# Patient Record
Sex: Male | Born: 1940
Health system: Southern US, Community
[De-identification: ages and names within clinical notes are randomized; demographics above are authoritative.]

## PROBLEM LIST (undated history)

## (undated) DIAGNOSIS — I480 Paroxysmal atrial fibrillation: Secondary | ICD-10-CM

## (undated) DIAGNOSIS — M549 Dorsalgia, unspecified: Secondary | ICD-10-CM

## (undated) DIAGNOSIS — I10 Essential (primary) hypertension: Secondary | ICD-10-CM

## (undated) DIAGNOSIS — N289 Disorder of kidney and ureter, unspecified: Secondary | ICD-10-CM

## (undated) DIAGNOSIS — I2510 Atherosclerotic heart disease of native coronary artery without angina pectoris: Secondary | ICD-10-CM

## (undated) DIAGNOSIS — R200 Anesthesia of skin: Secondary | ICD-10-CM

## (undated) DIAGNOSIS — N183 Chronic kidney disease, stage 3 unspecified: Secondary | ICD-10-CM

## (undated) DIAGNOSIS — I495 Sick sinus syndrome: Secondary | ICD-10-CM

## (undated) DIAGNOSIS — M1990 Unspecified osteoarthritis, unspecified site: Secondary | ICD-10-CM

## (undated) DIAGNOSIS — K2970 Gastritis, unspecified, without bleeding: Secondary | ICD-10-CM

## (undated) DIAGNOSIS — I5189 Other ill-defined heart diseases: Secondary | ICD-10-CM

## (undated) DIAGNOSIS — I209 Angina pectoris, unspecified: Secondary | ICD-10-CM

## (undated) DIAGNOSIS — Z87442 Personal history of urinary calculi: Secondary | ICD-10-CM

## (undated) DIAGNOSIS — Z860101 Personal history of adenomatous and serrated colon polyps: Secondary | ICD-10-CM

## (undated) DIAGNOSIS — R0600 Dyspnea, unspecified: Secondary | ICD-10-CM

## (undated) DIAGNOSIS — B9681 Helicobacter pylori [H. pylori] as the cause of diseases classified elsewhere: Secondary | ICD-10-CM

## (undated) DIAGNOSIS — Z86010 Personal history of colonic polyps: Secondary | ICD-10-CM

## (undated) DIAGNOSIS — I639 Cerebral infarction, unspecified: Secondary | ICD-10-CM

## (undated) DIAGNOSIS — G8929 Other chronic pain: Secondary | ICD-10-CM

## (undated) DIAGNOSIS — C61 Malignant neoplasm of prostate: Secondary | ICD-10-CM

## (undated) DIAGNOSIS — T4145XA Adverse effect of unspecified anesthetic, initial encounter: Secondary | ICD-10-CM

## (undated) DIAGNOSIS — K219 Gastro-esophageal reflux disease without esophagitis: Secondary | ICD-10-CM

## (undated) DIAGNOSIS — K222 Esophageal obstruction: Secondary | ICD-10-CM

## (undated) DIAGNOSIS — K589 Irritable bowel syndrome without diarrhea: Secondary | ICD-10-CM

## (undated) HISTORY — DX: Other chronic pain: G89.29

## (undated) HISTORY — DX: Personal history of colonic polyps: Z86.010

## (undated) HISTORY — DX: Other ill-defined heart diseases: I51.89

## (undated) HISTORY — DX: Esophageal obstruction: K22.2

## (undated) HISTORY — DX: Gastritis, unspecified, without bleeding: K29.70

## (undated) HISTORY — DX: Gastro-esophageal reflux disease without esophagitis: K21.9

## (undated) HISTORY — DX: Chronic kidney disease, stage 3 unspecified: N18.30

## (undated) HISTORY — DX: Sick sinus syndrome: I49.5

## (undated) HISTORY — DX: Essential (primary) hypertension: I10

## (undated) HISTORY — PX: INSERT / REPLACE / REMOVE PACEMAKER: SUR710

## (undated) HISTORY — DX: Paroxysmal atrial fibrillation: I48.0

## (undated) HISTORY — PX: APPENDECTOMY: SHX54

## (undated) HISTORY — DX: Atherosclerotic heart disease of native coronary artery without angina pectoris: I25.10

## (undated) HISTORY — DX: Helicobacter pylori (H. pylori) as the cause of diseases classified elsewhere: B96.81

## (undated) HISTORY — DX: Dorsalgia, unspecified: M54.9

## (undated) HISTORY — DX: Irritable bowel syndrome, unspecified: K58.9

## (undated) HISTORY — DX: Personal history of adenomatous and serrated colon polyps: Z86.0101

## (undated) HISTORY — PX: EYE SURGERY: SHX253

---
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
## 1995-02-22 HISTORY — PX: COLONOSCOPY: SHX174

## 2000-07-30 ENCOUNTER — Encounter: Payer: Self-pay | Admitting: Orthopaedic Surgery

## 2000-07-30 ENCOUNTER — Ambulatory Visit (HOSPITAL_COMMUNITY): Admission: RE | Admit: 2000-07-30 | Discharge: 2000-07-30 | Payer: Self-pay | Admitting: Orthopaedic Surgery

## 2001-03-13 ENCOUNTER — Encounter: Payer: Self-pay | Admitting: Family Medicine

## 2001-03-13 ENCOUNTER — Ambulatory Visit (HOSPITAL_COMMUNITY): Admission: RE | Admit: 2001-03-13 | Discharge: 2001-03-13 | Payer: Self-pay | Admitting: Family Medicine

## 2001-06-15 ENCOUNTER — Encounter: Payer: Self-pay | Admitting: Family Medicine

## 2001-06-15 ENCOUNTER — Ambulatory Visit (HOSPITAL_COMMUNITY): Admission: RE | Admit: 2001-06-15 | Discharge: 2001-06-15 | Payer: Self-pay | Admitting: Family Medicine

## 2001-10-05 ENCOUNTER — Ambulatory Visit (HOSPITAL_COMMUNITY): Admission: RE | Admit: 2001-10-05 | Discharge: 2001-10-05 | Payer: Self-pay | Admitting: Family Medicine

## 2001-10-05 ENCOUNTER — Encounter: Payer: Self-pay | Admitting: Family Medicine

## 2001-10-12 ENCOUNTER — Ambulatory Visit (HOSPITAL_COMMUNITY): Admission: RE | Admit: 2001-10-12 | Discharge: 2001-10-12 | Payer: Self-pay | Admitting: Family Medicine

## 2001-10-12 ENCOUNTER — Encounter: Payer: Self-pay | Admitting: Family Medicine

## 2001-11-06 ENCOUNTER — Ambulatory Visit (HOSPITAL_COMMUNITY): Admission: RE | Admit: 2001-11-06 | Discharge: 2001-11-06 | Payer: Self-pay | Admitting: General Surgery

## 2002-01-09 ENCOUNTER — Encounter: Payer: Self-pay | Admitting: Family Medicine

## 2002-01-09 ENCOUNTER — Ambulatory Visit (HOSPITAL_COMMUNITY): Admission: RE | Admit: 2002-01-09 | Discharge: 2002-01-09 | Payer: Self-pay | Admitting: Family Medicine

## 2002-03-21 ENCOUNTER — Ambulatory Visit (HOSPITAL_COMMUNITY): Admission: RE | Admit: 2002-03-21 | Discharge: 2002-03-21 | Payer: Self-pay | Admitting: Pulmonary Disease

## 2002-05-05 ENCOUNTER — Ambulatory Visit (HOSPITAL_COMMUNITY): Admission: RE | Admit: 2002-05-05 | Discharge: 2002-05-05 | Payer: Self-pay | Admitting: Neurosurgery

## 2002-05-05 ENCOUNTER — Encounter: Payer: Self-pay | Admitting: Neurosurgery

## 2002-09-18 ENCOUNTER — Ambulatory Visit (HOSPITAL_COMMUNITY): Admission: RE | Admit: 2002-09-18 | Discharge: 2002-09-19 | Payer: Self-pay | Admitting: Neurosurgery

## 2002-09-18 ENCOUNTER — Encounter: Payer: Self-pay | Admitting: Neurosurgery

## 2002-09-18 HISTORY — PX: BACK SURGERY: SHX140

## 2002-09-22 ENCOUNTER — Emergency Department (HOSPITAL_COMMUNITY): Admission: EM | Admit: 2002-09-22 | Discharge: 2002-09-22 | Payer: Self-pay | Admitting: Emergency Medicine

## 2002-09-25 ENCOUNTER — Ambulatory Visit (HOSPITAL_COMMUNITY): Admission: RE | Admit: 2002-09-25 | Discharge: 2002-09-25 | Payer: Self-pay | Admitting: Neurosurgery

## 2002-10-16 ENCOUNTER — Encounter (HOSPITAL_COMMUNITY): Admission: RE | Admit: 2002-10-16 | Discharge: 2002-11-15 | Payer: Self-pay | Admitting: Neurosurgery

## 2002-11-05 ENCOUNTER — Ambulatory Visit (HOSPITAL_COMMUNITY): Admission: RE | Admit: 2002-11-05 | Discharge: 2002-11-05 | Payer: Self-pay | Admitting: Family Medicine

## 2002-11-05 ENCOUNTER — Encounter: Payer: Self-pay | Admitting: Family Medicine

## 2003-01-22 ENCOUNTER — Ambulatory Visit (HOSPITAL_COMMUNITY): Admission: RE | Admit: 2003-01-22 | Discharge: 2003-01-22 | Payer: Self-pay | Admitting: Family Medicine

## 2003-02-07 ENCOUNTER — Ambulatory Visit (HOSPITAL_COMMUNITY): Admission: RE | Admit: 2003-02-07 | Discharge: 2003-02-07 | Payer: Self-pay | Admitting: Family Medicine

## 2003-09-22 HISTORY — PX: COLONOSCOPY: SHX174

## 2003-09-22 HISTORY — PX: ESOPHAGOGASTRODUODENOSCOPY: SHX1529

## 2003-10-14 ENCOUNTER — Ambulatory Visit (HOSPITAL_COMMUNITY): Admission: RE | Admit: 2003-10-14 | Discharge: 2003-10-14 | Payer: Self-pay | Admitting: Internal Medicine

## 2003-10-28 ENCOUNTER — Emergency Department (HOSPITAL_COMMUNITY): Admission: EM | Admit: 2003-10-28 | Discharge: 2003-10-28 | Payer: Self-pay | Admitting: Emergency Medicine

## 2004-03-17 ENCOUNTER — Ambulatory Visit (HOSPITAL_COMMUNITY): Admission: RE | Admit: 2004-03-17 | Discharge: 2004-03-17 | Payer: Self-pay | Admitting: Family Medicine

## 2004-04-12 ENCOUNTER — Ambulatory Visit (HOSPITAL_COMMUNITY): Admission: RE | Admit: 2004-04-12 | Discharge: 2004-04-12 | Payer: Self-pay | Admitting: Family Medicine

## 2004-08-03 ENCOUNTER — Ambulatory Visit (HOSPITAL_COMMUNITY): Admission: RE | Admit: 2004-08-03 | Discharge: 2004-08-03 | Payer: Self-pay | Admitting: Family Medicine

## 2004-08-11 ENCOUNTER — Ambulatory Visit: Payer: Self-pay | Admitting: Orthopedic Surgery

## 2004-08-31 ENCOUNTER — Encounter: Admission: RE | Admit: 2004-08-31 | Discharge: 2004-08-31 | Payer: Self-pay | Admitting: Orthopedic Surgery

## 2004-09-24 ENCOUNTER — Inpatient Hospital Stay (HOSPITAL_COMMUNITY): Admission: RE | Admit: 2004-09-24 | Discharge: 2004-09-26 | Payer: Self-pay | Admitting: Orthopedic Surgery

## 2004-09-24 HISTORY — PX: BACK SURGERY: SHX140

## 2005-02-27 ENCOUNTER — Emergency Department (HOSPITAL_COMMUNITY): Admission: EM | Admit: 2005-02-27 | Discharge: 2005-02-27 | Payer: Self-pay | Admitting: Emergency Medicine

## 2005-02-28 ENCOUNTER — Inpatient Hospital Stay (HOSPITAL_COMMUNITY): Admission: EM | Admit: 2005-02-28 | Discharge: 2005-03-07 | Payer: Self-pay | Admitting: Emergency Medicine

## 2005-03-01 ENCOUNTER — Ambulatory Visit: Payer: Self-pay | Admitting: Cardiology

## 2005-03-02 ENCOUNTER — Ambulatory Visit: Payer: Self-pay | Admitting: Internal Medicine

## 2005-03-02 HISTORY — PX: ERCP: SHX60

## 2005-03-03 ENCOUNTER — Encounter (INDEPENDENT_AMBULATORY_CARE_PROVIDER_SITE_OTHER): Payer: Self-pay | Admitting: General Surgery

## 2005-03-04 HISTORY — PX: CHOLECYSTECTOMY: SHX55

## 2005-03-11 ENCOUNTER — Inpatient Hospital Stay (HOSPITAL_COMMUNITY): Admission: RE | Admit: 2005-03-11 | Discharge: 2005-03-17 | Payer: Self-pay | Admitting: General Surgery

## 2005-03-12 ENCOUNTER — Encounter: Payer: Self-pay | Admitting: General Surgery

## 2005-03-16 ENCOUNTER — Encounter: Payer: Self-pay | Admitting: General Surgery

## 2005-04-01 ENCOUNTER — Ambulatory Visit (HOSPITAL_COMMUNITY): Admission: RE | Admit: 2005-04-01 | Discharge: 2005-04-01 | Payer: Self-pay | Admitting: Family Medicine

## 2005-04-04 ENCOUNTER — Ambulatory Visit (HOSPITAL_COMMUNITY): Admission: RE | Admit: 2005-04-04 | Discharge: 2005-04-04 | Payer: Self-pay | Admitting: Family Medicine

## 2005-04-05 ENCOUNTER — Encounter (HOSPITAL_COMMUNITY): Admission: RE | Admit: 2005-04-05 | Discharge: 2005-05-05 | Payer: Self-pay | Admitting: Family Medicine

## 2005-05-12 ENCOUNTER — Ambulatory Visit (HOSPITAL_COMMUNITY): Admission: RE | Admit: 2005-05-12 | Discharge: 2005-05-12 | Payer: Self-pay | Admitting: Family Medicine

## 2005-05-16 ENCOUNTER — Ambulatory Visit (HOSPITAL_COMMUNITY): Admission: RE | Admit: 2005-05-16 | Discharge: 2005-05-16 | Payer: Self-pay | Admitting: Orthopaedic Surgery

## 2005-10-21 ENCOUNTER — Ambulatory Visit (HOSPITAL_COMMUNITY): Admission: RE | Admit: 2005-10-21 | Discharge: 2005-10-21 | Payer: Self-pay | Admitting: Family Medicine

## 2006-02-08 ENCOUNTER — Ambulatory Visit (HOSPITAL_COMMUNITY): Admission: RE | Admit: 2006-02-08 | Discharge: 2006-02-08 | Payer: Self-pay | Admitting: Internal Medicine

## 2006-02-10 ENCOUNTER — Encounter: Admission: RE | Admit: 2006-02-10 | Discharge: 2006-02-10 | Payer: Self-pay | Admitting: Orthopaedic Surgery

## 2006-03-24 HISTORY — PX: BACK SURGERY: SHX140

## 2006-03-29 ENCOUNTER — Inpatient Hospital Stay (HOSPITAL_COMMUNITY): Admission: RE | Admit: 2006-03-29 | Discharge: 2006-04-03 | Payer: Self-pay | Admitting: Orthopaedic Surgery

## 2006-08-15 ENCOUNTER — Ambulatory Visit (HOSPITAL_COMMUNITY): Admission: RE | Admit: 2006-08-15 | Discharge: 2006-08-15 | Payer: Self-pay | Admitting: Urology

## 2006-08-18 ENCOUNTER — Encounter: Admission: RE | Admit: 2006-08-18 | Discharge: 2006-08-18 | Payer: Self-pay | Admitting: Orthopaedic Surgery

## 2006-08-31 ENCOUNTER — Encounter: Admission: RE | Admit: 2006-08-31 | Discharge: 2006-08-31 | Payer: Self-pay | Admitting: Orthopaedic Surgery

## 2006-09-18 ENCOUNTER — Encounter: Admission: RE | Admit: 2006-09-18 | Discharge: 2006-09-18 | Payer: Self-pay | Admitting: Orthopaedic Surgery

## 2006-10-09 ENCOUNTER — Encounter: Admission: RE | Admit: 2006-10-09 | Discharge: 2006-10-09 | Payer: Self-pay | Admitting: Internal Medicine

## 2006-10-27 ENCOUNTER — Encounter: Admission: RE | Admit: 2006-10-27 | Discharge: 2006-10-27 | Payer: Self-pay | Admitting: Orthopaedic Surgery

## 2007-04-16 ENCOUNTER — Ambulatory Visit (HOSPITAL_COMMUNITY): Admission: RE | Admit: 2007-04-16 | Discharge: 2007-04-16 | Payer: Self-pay | Admitting: Family Medicine

## 2007-05-16 ENCOUNTER — Ambulatory Visit: Payer: Self-pay | Admitting: Internal Medicine

## 2007-08-17 ENCOUNTER — Ambulatory Visit (HOSPITAL_COMMUNITY): Admission: RE | Admit: 2007-08-17 | Discharge: 2007-08-17 | Payer: Self-pay | Admitting: Family Medicine

## 2007-11-13 ENCOUNTER — Ambulatory Visit (HOSPITAL_COMMUNITY): Admission: RE | Admit: 2007-11-13 | Discharge: 2007-11-13 | Payer: Self-pay | Admitting: Family Medicine

## 2007-11-28 ENCOUNTER — Ambulatory Visit (HOSPITAL_COMMUNITY): Admission: RE | Admit: 2007-11-28 | Discharge: 2007-11-28 | Payer: Self-pay | Admitting: Family Medicine

## 2008-01-22 HISTORY — PX: ESOPHAGOGASTRODUODENOSCOPY: SHX1529

## 2008-01-22 HISTORY — PX: OTHER SURGICAL HISTORY: SHX169

## 2008-01-24 ENCOUNTER — Ambulatory Visit: Payer: Self-pay | Admitting: Internal Medicine

## 2008-01-24 LAB — CONVERTED CEMR LAB
ALT: 35 units/L (ref 0–53)
Indirect Bilirubin: 0.7 mg/dL (ref 0.0–0.9)
Lipase: 36 units/L (ref 0–75)
Total Protein: 7.6 g/dL (ref 6.0–8.3)

## 2008-01-29 ENCOUNTER — Encounter: Payer: Self-pay | Admitting: Internal Medicine

## 2008-01-29 ENCOUNTER — Ambulatory Visit (HOSPITAL_COMMUNITY): Admission: RE | Admit: 2008-01-29 | Discharge: 2008-01-29 | Payer: Self-pay | Admitting: Internal Medicine

## 2008-01-29 ENCOUNTER — Ambulatory Visit: Payer: Self-pay | Admitting: Internal Medicine

## 2008-02-29 ENCOUNTER — Ambulatory Visit (HOSPITAL_COMMUNITY): Admission: RE | Admit: 2008-02-29 | Discharge: 2008-02-29 | Payer: Self-pay | Admitting: Internal Medicine

## 2008-03-03 ENCOUNTER — Ambulatory Visit (HOSPITAL_COMMUNITY): Admission: RE | Admit: 2008-03-03 | Discharge: 2008-03-03 | Payer: Self-pay | Admitting: Internal Medicine

## 2008-04-02 ENCOUNTER — Telehealth (INDEPENDENT_AMBULATORY_CARE_PROVIDER_SITE_OTHER): Payer: Self-pay

## 2009-04-16 ENCOUNTER — Ambulatory Visit (HOSPITAL_COMMUNITY): Admission: RE | Admit: 2009-04-16 | Discharge: 2009-04-16 | Payer: Self-pay | Admitting: Family Medicine

## 2009-06-12 ENCOUNTER — Ambulatory Visit (HOSPITAL_COMMUNITY): Admission: RE | Admit: 2009-06-12 | Discharge: 2009-06-12 | Payer: Self-pay | Admitting: Family Medicine

## 2009-08-13 ENCOUNTER — Encounter: Admission: RE | Admit: 2009-08-13 | Discharge: 2009-08-13 | Payer: Self-pay | Admitting: Orthopaedic Surgery

## 2010-03-13 ENCOUNTER — Encounter: Payer: Self-pay | Admitting: Family Medicine

## 2010-04-13 ENCOUNTER — Ambulatory Visit (HOSPITAL_COMMUNITY)
Admission: RE | Admit: 2010-04-13 | Discharge: 2010-04-13 | Disposition: A | Discharge status: Home / Self Care | Payer: Medicare Other | Source: Ambulatory Visit | Attending: Family Medicine | Admitting: Family Medicine

## 2010-04-13 ENCOUNTER — Other Ambulatory Visit (HOSPITAL_COMMUNITY): Payer: Self-pay | Admitting: Family Medicine

## 2010-04-13 DIAGNOSIS — M25579 Pain in unspecified ankle and joints of unspecified foot: Secondary | ICD-10-CM | POA: Insufficient documentation

## 2010-04-13 DIAGNOSIS — R52 Pain, unspecified: Secondary | ICD-10-CM

## 2010-04-13 DIAGNOSIS — M25476 Effusion, unspecified foot: Secondary | ICD-10-CM | POA: Insufficient documentation

## 2010-04-13 DIAGNOSIS — M25569 Pain in unspecified knee: Secondary | ICD-10-CM | POA: Insufficient documentation

## 2010-04-13 DIAGNOSIS — M25473 Effusion, unspecified ankle: Secondary | ICD-10-CM | POA: Insufficient documentation

## 2010-05-07 ENCOUNTER — Ambulatory Visit (INDEPENDENT_AMBULATORY_CARE_PROVIDER_SITE_OTHER): Payer: Medicare Other | Admitting: Urology

## 2010-05-07 ENCOUNTER — Other Ambulatory Visit: Payer: Self-pay | Admitting: Urology

## 2010-05-07 DIAGNOSIS — N390 Urinary tract infection, site not specified: Secondary | ICD-10-CM

## 2010-05-07 DIAGNOSIS — R310 Gross hematuria: Secondary | ICD-10-CM

## 2010-05-07 DIAGNOSIS — Z8546 Personal history of malignant neoplasm of prostate: Secondary | ICD-10-CM

## 2010-05-12 ENCOUNTER — Encounter (HOSPITAL_COMMUNITY): Payer: Self-pay

## 2010-05-12 ENCOUNTER — Ambulatory Visit (HOSPITAL_COMMUNITY)
Admission: RE | Admit: 2010-05-12 | Discharge: 2010-05-12 | Disposition: A | Discharge status: Home / Self Care | Payer: Medicare Other | Source: Ambulatory Visit | Attending: Urology | Admitting: Urology

## 2010-05-12 DIAGNOSIS — R310 Gross hematuria: Secondary | ICD-10-CM | POA: Insufficient documentation

## 2010-05-12 DIAGNOSIS — N200 Calculus of kidney: Secondary | ICD-10-CM | POA: Insufficient documentation

## 2010-05-12 DIAGNOSIS — C61 Malignant neoplasm of prostate: Secondary | ICD-10-CM | POA: Insufficient documentation

## 2010-05-12 HISTORY — DX: Malignant neoplasm of prostate: C61

## 2010-05-12 LAB — CREATININE, SERUM: GFR calc non Af Amer: >60 mL/min (ref >60)

## 2010-05-14 ENCOUNTER — Ambulatory Visit (INDEPENDENT_AMBULATORY_CARE_PROVIDER_SITE_OTHER): Payer: Medicare Other | Admitting: Urology

## 2010-05-14 DIAGNOSIS — N453 Epididymo-orchitis: Secondary | ICD-10-CM

## 2010-05-14 DIAGNOSIS — R310 Gross hematuria: Secondary | ICD-10-CM

## 2010-05-14 DIAGNOSIS — N390 Urinary tract infection, site not specified: Secondary | ICD-10-CM

## 2010-06-11 ENCOUNTER — Ambulatory Visit (INDEPENDENT_AMBULATORY_CARE_PROVIDER_SITE_OTHER): Payer: Medicare Other | Admitting: Urology

## 2010-06-11 DIAGNOSIS — R310 Gross hematuria: Secondary | ICD-10-CM

## 2010-06-11 DIAGNOSIS — N453 Epididymo-orchitis: Secondary | ICD-10-CM

## 2010-06-25 ENCOUNTER — Ambulatory Visit (INDEPENDENT_AMBULATORY_CARE_PROVIDER_SITE_OTHER): Payer: Medicare Other | Admitting: Urology

## 2010-06-25 DIAGNOSIS — N453 Epididymo-orchitis: Secondary | ICD-10-CM

## 2010-06-25 DIAGNOSIS — N320 Bladder-neck obstruction: Secondary | ICD-10-CM

## 2010-06-25 DIAGNOSIS — R109 Unspecified abdominal pain: Secondary | ICD-10-CM

## 2010-07-06 NOTE — Consult Note (Signed)
NAME:  KAYMEN, ADRIAN NO.:  [id number]   MEDICAL RECORD NO.:  [id number]         PATIENT TYPE:  PAMB   LOCATION:  DAY                           FACILITY:  [facility]   PHYSICIAN:  R. Roetta Sessions, M.D. DATE OF BIRTH:  Sep 22, 1940   DATE OF CONSULTATION:  DATE OF DISCHARGE:                                 CONSULTATION   PRIMARY CARE PHYSICIAN:  Angus G. McInnis, MD   CHIEF COMPLAINT:  Upper abdominal and lower abdominal pain, history of  colonic polyps, erosive reflux esophagitis.   Mr. Urias Sheek. 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  He has chronic gastroesophageal reflux disease symptoms.  He also has some constant suprapubic and bilateral lower quadrant  abdominal pain.  He has not had any melena or rectal bleeding.  He has a  history of colonic polyps, last examination was in 2005.  He is due for  a surveillance next year.  He also has history of erosive reflux  esophagitis and Schatzki ring.  Last EGD was in 2005.  We saw him on  May 16, 2007, with worsening heartburn, reflux symptoms, and  intermittent upper abdominal pain.  Gallbladder is out.  He has a  history of gangrenous cholecystitis.  Postoperative complications  included abscess requiring percutaneous drainage.  He did have common  duct stones for which he underwent ERCP and sphincterotomy by me.  He is  supposed to be on daily acid suppression therapy, but stopped taking it.  In fact, he was supposed to come back to see [place] on June 14, 2007, but  was no-show.  He is not having any odynophagia or dysphagia.  He does  not consume alcohol, in fact he is really on no medications whatsoever  at this time.   PAST MEDICAL HISTORY:  History of prostate carcinoma status post  radiation seed implants, back surgery x2, spinal stenosis,  hemorrhoidectomy, appendectomy, cholecystectomy, ERCP, history of H.  pylori  gastritis treated, irritable bowel syndrome, benign tumor removed  from his tongue.   CURRENT MEDICATIONS:  None.   ALLERGIES:  No known drug allergies   FAMILY HISTORY:  Negative for chronic GI or liver disease, although  there is a sketchy history of father possibly having gastric cancer.   SOCIAL HISTORY:  The patient is married, and he has 3 healthy children.  He is a retired Arts development officer.  No tobacco, alcohol, or illicit drug  use.   REVIEW OF SYSTEMS:  No change in weight, chest pain, dyspnea on  exertion, fever, chills, night sweats.   PHYSICAL EXAMINATION:  GENERAL:  Pleasant 70 year old gentleman resting  comfortably.  VITAL SIGNS:  Weight 262, height 6 feet, temp 98.6, BP 130/90, pulse 64.  SKIN:  Warm and dry.  There is no jaundice.  No cutaneous stigmata of  chronic liver disease.  HEENT:  No scleral icterus.  Conjunctivae are pink.  CHEST:  Lungs are clear to auscultation.  CARDIAC:  Regular rate and rhythm without murmur, gallop, or  rub.  ABDOMEN:  Nondistended, obese, positive bowel sounds, soft.  He does  have some vague right upper quadrant tenderness to palpation.  Laparoscopy scars are well healed.  No obvious mass or organomegaly.  EXTREMITIES:  No edema.  RECTAL:  Deferred at the time of colonoscopy.   IMPRESSION:  Mr. Aundray Cartlidge. Eppinger is a pleasant 70 year old gentleman with  long history of erosive reflux esophagitis.  He has had intermittent  nonspecific upper abdominal pain.  Earlier in this year in February, he  had normal LFTs, not mentioned above, and also he had a CT of the  abdomen and pelvis as he reports by Dr. Renard Matter 1 month ago at [facility] which showed no findings that would explain his symptoms.  He does  have a tendency towards constipation.  I am concerned he is not on any  acid suppression therapy in the setting of complicating gastroesophageal  reflux disease.   RECOMMENDATIONS:  I will go ahead and offer Mr. Jarchow a diagnostic EGD   at this time.  In the same setting, we will go ahead and perform a  surveillance colonoscopy.  Risks, benefits, alternatives, limitations  have been reviewed.  We will go ahead and checkup baseline LFTs,  amylase, and lipase today.  His questions were answered, and he is  agreeable.  We will set this up in the near future and make further  recommendations once endoscopic evaluation has been performed.      Jonathon Bellows, M.D.  Electronically Signed     RMR/MEDQ  D:  01/24/2008  T:  01/24/2008  Job:  [id number]   cc:   Angus G. Renard Matter, MD  Fax: [phone]

## 2010-07-06 NOTE — Op Note (Signed)
NAME:  [name], [name]                   ACCOUNT NO.:  [id number]   MEDICAL RECORD NO.:  [id number]          PATIENT TYPE:  AMB   LOCATION:  DAY                           FACILITY:  [facility]   PHYSICIAN:  R. Roetta Sessions, M.D. DATE OF BIRTH:  Mar 17, 1940   DATE OF PROCEDURE:  01/29/2008  DATE OF DISCHARGE:                               OPERATIVE REPORT   PROCEDURE PERFORMED:  Diagnostic EGD, followed by incomplete colonoscopy  with biopsy.   INDICATIONS FOR PROCEDURE:  A 70 year old gentleman with longstanding  gastroesophageal reflux disease (history of erosive reflux esophagitis),  nonspecific upper abdominal pain recently.  CT of the abdomen and pelvis  was obtained earlier this year, and it demonstrated no abnormalities.  He has a tendency towards constipation.  He has a history of colonic  polyps.  EGD and colonoscopy are now being done.  Potential risks,  benefits, and alternatives have been reviewed and questions were  answered.  Please see the documentation in the medical record.   PROCEDURE NOTE:  O2 saturation, blood pressure, pulse, and respirations  monitored throughout the entirety of both the procedures.   CONSCIOUS SEDATION:  Versed 5 mg IV, and Demerol 125 mg IV in divided  doses.   INSTRUMENT:  Pentax video chip system.  Cetacaine spray used by Pentax  video chip system.   FINDINGS:  Examination of the tubular esophagus revealed a couple of  tiny distal esophageal erosions.  There was no Barrett's esophagus or  other abnormality.  EG junction easily traversed into the stomach.  Gastric cavity was emptied, and insufflated well with air.  Thorough  examination of the gastric mucosa including retroflexed view of the  proximal stomach esophagogastric junction demonstrated only a small  hiatal hernia.  Pylorus was patent and easily traversed.  Examination of  the bulb and second portion revealed no abnormalities.  Therapeutic/diagnostic maneuvers performed: None.   The  patient tolerated the procedure and was prepared for colonoscopy.  Digital rectal exam revealed no abnormalities.  Endoscopic findings, the  prep was good.  Colon:  Colonic mucosa was surveyed from the  rectosigmoid junction well into, what I believe is, the mid colon,  encountered recurrent looping and redundancy in the colon.  It took  quite a while, taking time while changing the patient's position from  left lateral decubitus to right, to supine, and various combination of  external abdominal pressure was unable to overcome looping and  redundancy, and consequently it did not make it to the cecum   There was a diminutive mid descending colon polyp, which was cold  biopsied/removed.  He also had left-sided diverticulum.  The colonic  mucosa that was seen was otherwise normal in appearance.  The scope was  pulled back down to the rectum with examination of rectal mucosa,  including retroflexed view of the anal verge demonstrated no  abnormalities.  The patient tolerated the procedure well and with  reacted in endoscopy.   IMPRESSION:  EGD:  Tiny distal esophageal erosion consistent with mild  reflux esophagitis, otherwise unremarkable esophagus.  Small hiatal  hernia,  otherwise normal stomach, D1, D2.   COLONOSCOPY FINDINGS:  Normal rectum.  Incomplete exam.  Left-sided  diverticula and mid descending colon polyp, status post cold biopsy  removal.  Proximal colon/cecum.   RECOMMENDATIONS:  1. We will compliment today's incomplete colonoscopy with air contrast      barium enema in one month.  2. Followup by Path.  3. Diverticulosis.  4. Gastroesophageal reflux disease literature provided to Mr. Fossum.  5. He is on no acid suppression therapy.  He needs to be on some, we      will start him on some Aciphex 20 mg daily one tablet before      breakfast.  He is to get by the office for free samples.  6. Benefiber fiber supplement one tablespoon daily.  7. MiraLax 17 g early at  bedtime p.r.n. constipation.  8. Followup appointment with [place] in six weeks.      Jonathon Bellows, M.D.  Electronically Signed     RMR/MEDQ  D:  01/29/2008  T:  01/30/2008  Job:  [id number]   cc:   Angus G. Renard Matter, MD  Fax: [phone]

## 2010-07-06 NOTE — Assessment & Plan Note (Signed)
NAMEMarland Kitchen  [name], [name]                    CHART#:  [id number]   DATE:  05/16/2007                       DOB:  Dec 08, 1940   CHIEF COMPLAINT:  Indigestion.  Wants colonoscopy and EGD.   HISTORY OF PRESENT ILLNESS:  [name] is a 70 year old male.  He has  been seen in followup by Dr. Jena Gauss previously.  He has history of ERCP  and sphincter stone retrieval back in January 2007.  He also had  cholecystectomy for gangrenous cholecystitis.  He has history of erosive  esophagitis and Schatzki's ring, last EGD October 14, 2003, as well as a  small hiatal hernia.  At the same time he had a colonoscopy which showed  scattered sigmoid diverticulosis, and a polyp at the splenic flexure.  He does have history of tubular adenoma in the ascending colon, which  was removed in 1997.  He also has history of IBS.  He notes that he has  had significant indigestion and heartburn.  He is not taking any PPIs at  this time.  He denies any nausea or vomiting.  Denies any anorexia or  early satiety.  He complains of grabbing and drawing-type pain that only  lasts a few minutes.  He rates the pain 4/10 on pain scale, and points  to his upper abdomen.  His weight has remained stable.  He denies any  rectal bleeding or melena.  He has tried over-the-counter Zantac, which  does seem to help some.  He denies any dysphagia or odynophagia.   PAST MEDICAL/SURGICAL HISTORY:  He has had:  1. Hemorrhoidectomy.  2. Appendectomy.  3. Cholecystectomy for gangrenous cholecystitis.  4. ERCP with stone extraction and sphincterotomy prior to      cholecystectomy by Dr. Jena Gauss.  5. He has had 2 back surgeries.  6. He has had prostate carcinoma status post radiation, seed implants.  7. He has history of H. pylori status post treatment.  8. Chronic back pain.  9. Severe spinal stenosis.  10.IBS.  11.Benign tumor removed from his tongue.   NO KNOWN DRUG ALLERGIES.   FAMILY HISTORY:  There is no known family history of colorectal  carcinoma, however, he believes his father had gastric carcinoma, and  deceased at age 11.  Mother, age 22, has diabetes mellitus,  hypertension, coronary artery disease.  He has multiple siblings.   SOCIAL HISTORY:  [name] is married.  He has 3 healthy children.  He  is retired.  He denies any tobacco, alcohol or drug use.   REVIEW OF SYSTEMS:  See HPI, otherwise negative.   PHYSICAL EXAMINATION:  VITAL SIGNS:  Weight 261 pounds.  Height 74  inches.  Temperature 98.1.  Blood pressure 132/82.  Pulse 76.  GENERAL:  He is a well-developed, obese, Caucasian male in no acute  distress.  HEENT:  Sclera clear.  Sclerae anicteric.  Conjunctivae pink.  Oropharynx pink and moist without any lesions.  NECK:  Supple without mass or thyromegaly.  CHEST:  Heart regular rate and rhythm.  Normal S1 and S2 without  murmurs, gallops, or rubs.  LUNGS:  Clear to auscultation bilaterally.  ABDOMEN:  Protuberant with positive bowel sounds x4.  No bruits  auscultated.  Soft, non-distended, and nontender without palpable mass  or organosplenomegaly.  No guarding.  Exam was limited due  to patient's  body habitus.  EXTREMITIES:  Without clubbing or edema bilaterally.   LABORATORY STUDIES:  He had a normal TSH, a CBC which showed a white  blood cell count of 3.8, hemoglobin 16.1, hematocrit 48.3, platelets  143,000, glucose 103, and otherwise normal CMP, including LFTs.   IMPRESSION:  [name] is a 70 year old male with daily heartburn and  indigestion, as well as a grabbing-like drawing upper abdominal pain.  He has history of erosive esophagitis, and would be concerned about  complicated gastroesophageal reflux disease, however, he is not on  proton pump inhibitor.  As far as colonoscopy, given history of  adenomatous polyps, he is due for colonoscopy in August 2010.   PLAN:  1. Begin omeprazole 20 mg daily, number 30 with 2 refills.  2. Colonoscopy August 2010.  3. He is going to call if he does  not get relief from PPI, and at that      time, we would consider pursuing EGD.  Otherwise, he will follow up      in 1 month with Dr. Jena Gauss.       Lorenza Burton, N.P.  Electronically Signed     R. Roetta Sessions, M.D.  Electronically Signed    KJ/MEDQ  D:  05/17/2007  T:  05/17/2007  Job:  [id number]   cc:   Angus G. Renard Matter, MD

## 2010-07-09 NOTE — H&P (Signed)
NAMEREYAN, HELLE NO.:  [id number]   MEDICAL RECORD NO.:  [id number]           PATIENT TYPE:  INP   LOCATION:  A331                          FACILITY:  [facility]   PHYSICIAN:  Catalina Pizza, M.D.        DATE OF BIRTH:  07-Jan-1941   DATE OF ADMISSION:  02/28/2005  DATE OF DISCHARGE:  LH                                HISTORY & PHYSICAL   PRIMARY CARE PHYSICIAN:  Angus G. Renard Matter, MD   HISTORY OF PRESENT ILLNESS:  Mr. Aloi is a 70 year old gentleman with  minimal past medical history. He presents to the emergency department,  today, with worsening epigastric abdominal pain.  Apparently he was seen  approximately two weeks' ago with vague-type symptoms radiating up towards  his chest and an EKG was obtained in the office of Dr. Renard Matter and felt not  related to a cardiac source.  He, again, returned to the emergency  department on the 7th with the same type of pain and was given some pain  medicine through his IV and did improve some of his symptoms.  Again, he had  recurrence and presented, again, to the emergency department first thing  this morning.  He denies any specific nausea and vomiting.  No hematochezia.  No problems with bowel or bladder.  He had a low grade temperature  previously.  He did have a thorough workup in the emergency department  including labs and x-rays as you will see listed below.   PAST MEDICAL HISTORY:  The patient denies any previous surgeries. No other  past medical history.  History of lower back pain, status post surgery from  Dr. Darrelyn Hillock for spinal stenosis.  Past medical history also includes  prostate cancer diagnosed five years ago.  History of a 3 cm sessile tubular  adenoma in the ascending colon on coloscopy in 1997.  Status post  hemorrhoidectomy before, status post appendectomy, and benign tongue  resected on no date.   MEDICATIONS:  He is not taking any medicines routinely, other than  occasional Indocin p.r.n. per his report  1 daily if that much.   REVIEW OF SYSTEMS:  All negative except for this epigastric pain.   SOCIAL HISTORY:  Lives in [place].  Does not smoke or drink alcohol per  his report.  He has been married for 27 years, has 3 children and is  retired.   FAMILY HISTORY:  Negative for any GI illnesses, colon problems, or cancer  problems.   ALLERGIES:  No known drug allergies.   PHYSICAL EXAMINATION:  VITAL SIGNS:  On admission, following pain medicine  given, showed a blood pressure of 198/87, pulse is 81, O2 saturation 97% on  room air.  GENERAL:  This is a mildly obese, white male in no acute distress, lying in  bed, grades his pain as approximately a 5/10 at this point.  He says that it  is much improved with pain medicine and states that it is almost gone at  this time, although did grade it as a 5/10.  HEENT:  Pupils are equal, round, and react to light and accommodation.  Oropharynx is clear.  HEENT exam was normal.  LUNGS:  Clear to auscultation bilaterally.  HEART:  Regular rate and rhythm no murmurs, gallops, or rubs.  ABDOMEN:  Protuberant, but soft.  Positive bowel sounds.  No Murphy's sign.  No significant epigastric tenderness to palpation.  The patient did have a  specific point in the epigastric area which he was pointing too, but I was  unable to illicit any further pain with palpation of this.  No  hepatosplenomegaly appreciated.  EXTREMITIES:  No lower extremity edema.  No calf tenderness.  NEUROLOGIC:  Cranial nerves II-XII intact.  No deficits appreciated.   LABS:  Obtained during hospitalization had a CBC showing a white count of  9.4, hemoglobin of 15.1, platelet count of 178.  Amylase 48, lipase of 21,  CMET shows sodium 134, potassium 3.8, chloride 99, CO2 30, glucose 177, BUN  16, creatinine 1.1, total bilirubin 1.2, alkaline phosphatase 51, SGOT of  43, SGPT 38, total protein 6.5, albumin 3.6, calcium 8.6, blood cultures x2  are preliminary.  Cardiac panel  obtained shows CK of 106, CK/MB of 3,  relative index of 2.8, troponin I of 0.32.  ABG showed oxygen level at 2  liters showed pH 7.39, pCO2 of 48, and pO2 of 61.4.   IMAGES:  Obtained during ER visit showed chest x-ray showing no  cardiopulmonary disease.  Bowel gas pattern showing no obstructive bowel gas  pattern. A CT of the abdomen showed mild gallbladder distention, mild  pericholecystic stranding suggestive of cholecystitis, but suggested getting  a followup ultrasound; no other lesions noted.  Ultrasound showed  gallbladder borderline distended, but no specific evidence of cholecystitis  and hepatic steatosis, nothing else noted on pancreas or abdominal aorta.   IMPRESSION:  This is a 70 year old gentleman with epigastric pain which he  states occasionally radiates up into his chest.  Unclear cause for this.   ASSESSMENT AND PLAN:  1.  Epigastric pain.  I initially felt that this could be related to a      duodenal ulcer or gastric ulcer given taking routine Indocin, although      he has not been taking much of that. He has had previous history of      epigastric tenderness and seen by Dr. Karilyn Cota before.  We will get GI      involved for further assessment.  Do not feel that he needs a      hepatobiliary scan, at this time, and will leave that up to      gastroenterologist.  2.  Elevated cardiac markers.  The patient not having any significant EKG      changes in the emergency department, this morning, but a repeat EKG will      be obtained.  We will cycle enzyme further and get cardiology involved      for further assessment given his previous risk factors.  3.  Hypoxemia.  The patient does not note any shortness of breath, but did      have a lower O2 on pulse oximetry and confirmed on ABG as well, unsure      of his baseline, but the patient denies any smoking.  Unclear if     possibly could be something like a pulmonary embolism, could cause this.   DISPOSITION:  We will  obtain these consults and will follow along closely  with this patient. Unclear what is causing  this and Dr. Renard Matter will  followup in the morning for this.      Catalina Pizza, M.D.  Electronically Signed     ZH/MEDQ  D:  02/28/2005  T:  02/28/2005  Job:  [id number]

## 2010-07-09 NOTE — Consult Note (Signed)
[name], [name] NO.:  [id number]   MEDICAL RECORD NO.:  [id number]          PATIENT TYPE:  INP   LOCATION:  A331                          FACILITY:  [facility]   PHYSICIAN:  Rossmoyne Bing, M.D. [facility] OF BIRTH:  12-06-1940   DATE OF CONSULTATION:  03/01/2005  DATE OF DISCHARGE:                                   CONSULTATION   CARDIOLOGY CONSULTATION:   REFERRING PHYSICIAN:  Dr. Butch Penny   HISTORY OF PRESENT ILLNESS:  A 70 year old gentleman referred for  consultation concerning elevated troponin and hypoxemia in the setting of  acute cholecystitis. [name] has no significant prior cardiac history. A  treadmill stress test performed 3 years ago was reportedly negative. He has  not previously been assessed by a cardiologist nor had any other significant  cardiac testing. He recently has noted some exertional dyspnea. He has  experienced no chest discomfort. There has been no orthopnea nor PND. He  awakened with severe epigastric pain on the early morning of admission  prompting presentation to the emergency department. LFTs have been abnormal.  Troponin has been marginally elevated with normal CPK-MB.   PAST MEDICAL HISTORY:  Past medical history is notable for a history of  esophagitis, Schatzki's ring and a small hiatal hernia. He has chronic back  pain and irritable bowel syndrome. Carcinoma of prostate was treated with  brachytherapy 5 years ago. A 3 cm adenoma was resected from the right colon  in 1997. Surgical procedures have included hemorrhoidectomy, appendectomy  and intervention for spinal stenosis.   MEDICATIONS:  His only medication prior to admission was indomethacin p.r.n.   ALLERGIES:  He has no known allergies.   SOCIAL HISTORY:  Married and lives in [place]; three adult children;  retired from work as a Company secretary. No history of tobacco use nor excessive use  of alcohol. 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 Lifestyle is relatively sedentary.   FAMILY HISTORY:  Mother had CABG surgery in her 24s. Father had diabetes and  heart disease and died with cancer. Six siblings, none with coronary  disease.   REVIEW OF SYSTEMS:  Notable for occasional wheezing, mild weakness in legs  and chronic back pain. All other systems reviewed and are negative.   EXAMINATION:  GENERAL:  On exam, pleasant gentleman in no acute distress.  VITAL SIGNS:  The temperature is 101, heart rate 100 and regular,  respirations 25, blood pressure 125/75.  HEENT:  Anicteric sclerae; normal lids and conjunctiva; pupils equal, round,  react to light; normal oral mucosa.  NECK:  No jugular venous distension; faint right carotid bruit.  ENDOCRINE:  No thyromegaly.  HEMATOPOIETIC:  No adenopathy.  LUNGS:  Clear.  CARDIAC:  Normal first and second heart sounds; fourth heart sound present.  ABDOMEN:  Soft and nontender; normal bowel sounds; no organomegaly; no  masses.  EXTREMITIES:  Distal pulses intact; 1/2+ ankle edema.  MUSCULOSKELETAL:  No joint deformities.  NEUROMUSCULAR:  Symmetric strength and tone; normal cranial nerves.  PSYCHIATRIC:  Alert and  oriented; normal affect.   EKG:  Normal sinus rhythm; first-degree AV block; borderline left atrial  abnormality; left axis; nondiagnostic lateral Q-waves.   IMPRESSION:  [name] presents with acute cholecystitis. Relief of biliary  obstruction by ERCP is relatively urgently required. At the present time, he  appears stable although he continues to have fevers on antibiotics. His case  was discussed with Dr. Jena Gauss, who agreed that a brief delay in optimal  therapy is acceptable. I am particularly concerned about hypoxemia and  recent exertional dyspnea with mild elevations of troponin. Pulmonary  embolism is certainly a consideration in this setting. A D-dimer level will  be obtained and will likely be elevated in which case a CT scan of  chest  will be obtained to rule out pulmonary embolism and to further investigate  hypoxemia.   While his marginal elevation in troponin is certainly of concern, there is  no clear indication for an acute coronary syndrome. While stress testing  would be desirable, his risk of an acute coronary event in the setting of  the proposed relatively low risk procedure outweighs the benefits of  delaying same for functional perfusion imaging.   We greatly appreciate the request to provide consultative services for this  gentleman and will be happy to follow him with you.      Munford Bing, M.D. [facility]  Electronically Signed     RR/MEDQ  D:  03/01/2005  T:  03/01/2005  Job:  [id number]   cc:   Angus G. Renard Matter, MD  Fax: [phone]

## 2010-07-09 NOTE — Discharge Summary (Signed)
NAMECAMAR, [name] NO.:  [id number]   MEDICAL RECORD NO.:  [id number]          PATIENT TYPE:  INP   LOCATION:  5013                         FACILITY:  [facility]   PHYSICIAN:  Sharolyn Douglas, M.D.        DATE OF BIRTH:  1940-12-03   DATE OF ADMISSION:  03/29/2006  DATE OF DISCHARGE:  04/03/2006                               DISCHARGE SUMMARY   ADMITTING DIAGNOSES:  1. L2 to L5 spondyl stenosis and spondylosis.  2. History of prostate cancer.   DISCHARGE DIAGNOSES:  1. Status post L2 to L5 revision laminectomy and posterior spinal      fusion with pedicle screws.  2. Postoperative blood loss anemia.  3. Hyponatremia.  4. Hyperglycemia.  5. History of prostate cancer.   PROCEDURE:  On March 29, 2006 the patient was taken to the operating  room for an L2 to L5 revision laminectomy and posterior spinal fusion  with pedicle screws.  He also had repair of incidental sternotomy  intraoperatively.   SURGEON:  Max Noel Gerold.   ASSISTANT:  Colleen Mahar, P.A. C.   ANESTHESIA:  General.   CONSULTS:  None.   LABORATORY DATA:  Preoperatively, CBC with diff was within normal  limits.  H and H monitored x4 days postoperatively reached a low of 9.8  and 28.2.  PT INR and PTT preop normal.  Complete metabolic panel from  preop was normal with exception of glucose of 101.  Postoperatively,  BMET was monitored times 2 days.  Postop day 1, glucose was 138.  Postop  day 2, glucose was 138 and sodium was 132, otherwise normal.  UA from  preop was negative.  Blood typing preop was A+.  Antibody screen was  negative.  Urine culture showed no growth.  EKG from March 27, 2006  shows left sinus rhythm with first degree AV block, left axis deviation.  When compared to EKG from 2006, no significant change, read by Charlton Haws, M.D.  X-ray from March 29, 2006 showed interpedicular screws  on L2 to L5.  No complicating features.  X-ray from April 03, 2006  showed L2 to L5  decompression and effusion.  No postoperative  complication identified.   BRIEF HISTORY:  The patient is a 70 year old male who has had a long  history of problems with his back and his bilateral lower extremities.  He was unable to stand upright.  He had significant neurogenic  claudication, and 2 previous lumbar laminectomies.  Imaging studies  showed persistent spinal stenosis and thoracolumbar kyphosis.  Because  of this, as well as failure to improve conservatively, it was felt his  best course of management would be a revision laminectomy and fusion.  Risks and benefits of the surgery were discussed with the patient by Dr.  Noel Gerold, as well as myself.  He indicated understanding and opted to  proceed.   HOSPITAL COURSE:  On March 25, 2006, the patient was taken to the  operating room for the above listed procedure.  He tolerated the  procedure well without any intraoperative complications.  He was  transferred to the recovery room in stable condition.   Postoperatively, routine orthopedic spine protocol was followed, and he  progressed along relatively well.   Postoperatively, physical therapy and occupational therapy worked with  him on a daily basis on brace use and back precautions and a progressive  ambulation program.  He progressed along well with that.  The patient's  mild hyponatremia was treated with decreasing his IV fluids and he did  not require any further treatment for that.   On April 03, 2006, the patient was doing very well from a medical  standpoint.  He was orthopedically stable and ready for discharge home.   DISCHARGE PLAN:  The patient is a 70 year old male status post revision  lumbar laminectomy and posterior spinal fusion doing well.  Activity and  daily ambulation program.  Back precautions at all times.  No lifting  heavier than 5 pounds.  He may  shower.  Follow up 2 weeks  postoperatively with Dr. Noel Gerold.   MEDICATIONS ON DISCHARGE:  Vicodin  for pain.  Robaxin for muscle spasm.  Multivitamin daily.  Calcium 1200 daily.  Colace 100 mg twice daily.  Laxative as needed.  Avoid NSAIDS times 3 months.   DIET:  Regular home diet as tolerated.   CONDITION ON DISCHARGE:  Stable and improved.   DISPOSITION:  The patient is being discharged to his home with his  family's assistance, as well as home physical therapy and occupational  therapy.      Verlin Fester, P.A.      Sharolyn Douglas, M.D.  Electronically Signed    CM/MEDQ  D:  06/15/2006  T:  06/15/2006  Job:  [id number]

## 2010-07-09 NOTE — Procedures (Signed)
   NAME:  [name], [name]                             ACCOUNT NO.:  [id number]   MEDICAL RECORD NO.:  [id number]                   PATIENT TYPE:  OUT   LOCATION:  DFTL                                 FACILITY:  [facility]   PHYSICIAN:  Edward L. Juanetta Gosling, M.D.             DATE OF BIRTH:  1940/11/06   DATE OF PROCEDURE:  DATE OF DISCHARGE:                                    STRESS TEST   PROCEDURE:  Stress test.   BRIEF HISTORY:  This patient is undergoing graded exercise testing because  he has hyperlipidemia and is to rule out ischemic cardiac disease.   There are no contraindications to graded exercise testing.   DESCRIPTION OF PROCEDURE:  This patient exercised for 6 minutes and 3-  seconds on the Bruce Protocol reaching, and sustaining, 7.0 METS.  His  maximum recorded heart rate was 142 which is 90% of his age-predicted  maximum heart rate.  He had no chest pain during exercise.  His blood pressure response to  exercise was somewhat exaggerated. He had no electrocardiographic suggestive  of inducible ischemia and he did have PVCs at the peak of exercise.   IMPRESSION:  1. No evidence of inducible ischemia.  2. No chest pain during exercise.  3. Slightly exaggerated blood pressure response to exercise.  4. Premature ventricular contractions with exercise.  5. Fair exercise tolerance.                                               Edward L. Juanetta Gosling, M.D.    ELH/MEDQ  D:  03/21/2002  T:  03/21/2002  Job:  [id number]   cc:   Angus G. Renard Matter, M.D.  [address]  [place]  [place] [address]  Fax: [phone]

## 2010-07-09 NOTE — Consult Note (Signed)
NAME:  MARGUES, FILIPPINI                   ACCOUNT NO.:  [id number]   MEDICAL RECORD NO.:  [id number]          PATIENT TYPE:  INP   LOCATION:  A331                          FACILITY:  [facility]   PHYSICIAN:  R. Roetta Sessions, M.D. DATE OF BIRTH:  [birth year]   DATE OF CONSULTATION:  DATE OF DISCHARGE:                                   CONSULTATION   REASON FOR CONSULTATION:  Severe epigastric pain.   HISTORY OF PRESENT ILLNESS:  Mr. Banh is a 70 year old Caucasian male who  has had a two day history of severe epigastric pain.  He was awakened from  his slumber, Saturday morning around 2 a.m.  The pain was so severe that he  presented to the emergency department.  He underwent evaluation there, had  an acute abdomen and chest x-ray which was normal.  He had EKG which, per  the patient, was normal, and was given some IV pain medication and sent  home. He did feel better.  However, that evening he had a cube steak for  dinner, and again was awakened in the early morning hours with severe  abdominal pain in his epigastrium.  He rates the pain 10/10 at worse.  It is  currently about 6/10.  The pain is constant and stabbing.  There is no  relief except when he is given IV pain medications.  He denies any nausea,  vomiting, diarrhea or change in his bowel habits.  His last bowel movement  was yesterday.  It was soft and brown.  He denies any rectal bleeding or  melena.  He generally has daily bowel movements.  He does have occasional  indigestion.  He notes the pain does radiate up to his anterior chest area.  He states he has never had this type of abdominal pain before.  There is no  associated shortness of breath, diaphoresis.  He has had low-grade  temperature around 100.  He is not taking any NSAIDS or aspirin at home.  Takes Rolaids about once a month.  He denies any dysphagia or odynophagia.  He had been on Indomethacin previously, but this was discontinued a week ago  for chronic back pain.   He had normal troponins yesterday.  Today he was found to have an elevated  troponin at 0.32.  Cardiology consult is pending.   He had an abdominal ultrasound which showed a mildly distended gallbladder,  normal CBD and hepatic steatosis.  He has CT scan of abdomen and pelvis  which showed mild gallbladder distention and mild cholecystic inflammation.   PAST MEDICAL HISTORY:  1.  He had an EGD on October 14, 2003, which showed erosive esophagitis and      __________Schatzki's Ring, small hiatal hernia.  At the same time, he      had a colonoscopy which showed scattered sigmoid diverticulosis, and he      had a polyp at the splenic flexure.  2.  Previously, in 1997, he had a colonoscopy with a 3 cm tubular adenoma in      the ascending colon by Dr. Danny Lawless.  He was CLO test positive and status      post treatment in 1997.  3.  He has history of IBS.  4.  Chronic back pain for which he is followed by Dr. Tinnie Gens.  5.  He had surgery in August 2006 for severe spinal stenosis with disease      from L3, L4 and L5.  6.  He has had previous hemilaminectomy and microdiskectomy as well.  7.  He had prostate carcinoma and was treated with __________implants.   MEDICATIONS PRIOR TO ADMISSION:  Indocin 75 mg daily which was discontinued  a week ago.   ALLERGIES:  No known drug allergies.   FAMILY HISTORY:  Mother, age 35, is alive with history of coronary artery  disease and diabetes mellitus.  Father deceased at age 18 secondary to  coronary artery disease.  He also had diabetes mellitus.  He has four live  siblings, one deceased, otherwise noncontributory.   SOCIAL HISTORY:  Mr. Kolakowski has been married for 29 years.  He has three  grown healthy children.  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 He  denies any tobacco, alcohol or drug use.   REVIEW OF SYSTEMS:  CONSTITUTIONAL:  Weight is stable.  Denies any fatigue.  Denies any anorexia or early satiety.  CARDIOVASCULAR:  He reportedly had  a  normal cardiac workup within the last year.  PULMONOLOGY:  Denies any cough,  shortness of breath, dyspnea or hemoptysis.  Although, his O2 saturations  have been somewhat low.   PHYSICAL EXAMINATION:  VITAL SIGNS:  Temperature 101.1, pulse 014,  respirations 20, blood pressure 111/71, O2 saturation 91% on 2 liters per  nasal cannula.  GENERAL APPEARANCE:  Mr. Pham is a 70 year old Caucasian male who is alert  and oriented, pleasant, cooperative in no acute distress.  HEENT:  Sclerae are clear, nonicteric.  Conjunctivae are pink.  Oropharynx  pink and moist without any lesions.  NECK:  Supple without any mass or thyromegaly.  CHEST:  Heart regular rate and rhythm with normal S1, S2, without any  murmurs, clicks, rubs or gallops.  LUNGS:  Clear to auscultation bilaterally.  ABDOMEN:  Positive bowel sounds x4.  No bruits auscultated.  Soft,  nondistended.  He does have mild tenderness on depalpation to the  epigastrium.  Negative Murphy's sign.  No rebound tenderness or guarding.  Liver is palpable 1-2 fingerbreadth below the right costal margin.  Unable  to palpate splenomegaly.  No rebound tenderness or guarding.  EXTREMITIES:  Without edema or clubbing bilaterally.   LABORATORY DATA:  WBC 9.4, hemoglobin 15.1, hematocrit 43.2, platelets  178,000.  Calcium 8.6.  Sodium 134, potassium 3.8, chloride 99, CO2 30, BUN  16, creatinine 1.1, glucose 177.  Total bilirubin 1.2, alk-phosphatase 51,  AST 43, ALT 38, total protein 6.5, albumin 3.6, amylase 48, lipase 21.   IMPRESSION:  Mr. Hackbart is a 70 year old Caucasian male with a two day  history of sudden onset of epigastric pain that radiates to the anterior  chest bilaterally.  The patient notes the pain awakened him from slumber at  2 a.m. Saturday morning.  He reported to the ER and was sent home with good  pain relief.  The patient returned once his medications were off.  He returned to the emergency room.  He had also had a steak  to eat that night.  He had an abdominal ultrasound today which showed gallbladder was mildly  distended, hepatic steatosis and a normal common  bile duct.  He had a CT of  the abdomen and pelvis which confirmed a moderately distended gallbladder  and mild cholecystic inflammation, but these changes were not marked.  The  patient had elevated troponin at 0.32.  He has some low O2 saturation in the  88 to 91 range and a family history of coronary artery disease.  He has a  cardiology consult pending.   Mr.  Alicea pain is atypical of peptic ulcer disease and could be related  to erosive esophagitis, but I feel his symptoms could be biliary in  etiology.  His pain could be related to cholecystitis as well as given CT  findings and elevation in his white blood cell and fever.  Mr. Vreeland is  going to need cardiac workup initially given findings above.   PLAN:  1.  Will await Cardiology input.  2.  Recheck CBC in a.m.  Check liver function tests now.  Will consider HIDA      scan initially.  If this is negative, would proceed with EGD.  3.  Agree with b.i.d. Protonix.  4.  Further recommendations to follow.   We would like to thank Dr. Margo Aye for allowing [place] to participate in the care  of Mr. Vines.      Nicholas Lose, N.P.      Jonathon Bellows, M.D.  Electronically Signed    KC/MEDQ  D:  02/28/2005  T:  03/01/2005  Job:  [id number]   cc:   Angus G. Renard Matter, MD  Fax: [phone]

## 2010-07-09 NOTE — Group Therapy Note (Signed)
[name], [name] NO.:  [id number]   MEDICAL RECORD NO.:  [id number]          PATIENT TYPE:  INP   LOCATION:  A213                          FACILITY:  [facility]   PHYSICIAN:  Catalina Pizza, M.D.        DATE OF BIRTH:  Mar 13, 1940   DATE OF PROCEDURE:  03/05/2005  DATE OF DISCHARGE:                                   PROGRESS NOTE   SUBJECTIVE:  [name] is postop day #2 from laparoscopic cholecystectomy  following a gangrenous gallbladder.  He has remained stable following  surgery.  He has been lethargic due to pain medicine, but he has been  responding appropriately when the patient is alert.   OBJECTIVE:  VITAL SIGNS:  Stable.  Patient has remained afebrile with T-max  of 99.5.  LUNGS:  Clear to auscultation bilaterally.  HEART:  Regular rate and rhythm.  ABDOMEN:  Soft with some abdominal incisions.  Only tenderness around the  incision site, but no other problems noted.  Drain is still in place.   LABORATORY DATA:  CBC:  White count 7.8, hemoglobin 11.0.  B-met:  Sodium  136, potassium 3.8, chloride 101, CO2 27, glucose 127, BUN 16, creatinine  0.9, calcium 7.3.  hepatic function showed total bilirubin of 1.3 down from  yesterday which was 2.0, direct bilirubin at 0.7, SGOT 96, SGPT 81, total  protein 5.4, albumin 1.9.   ASSESSMENT:  This is a 70 year old white male who is status post  cholecystectomy for gangrenous gallbladder.   PLAN:  The patient is followed closely by Dr. Lovell Sheehan, who is ordering most  of the orders.  Plan is to get him out of the ICU today.  Continue to try to  ambulate the patient and get the patient back to normal functioning before  leaving the hospital.  Estimated date of discharge will be beginning of next  week, rechecking routine labs daily and cutting back on his pain medication  so the patient is not as lethargic.  When the patient is more with it, then  we will discontinue the Foley as well.      Catalina Pizza, M.D.  Electronically Signed     ZH/MEDQ  D:  03/05/2005  T:  03/07/2005  Job:  [id number]

## 2010-07-09 NOTE — Group Therapy Note (Signed)
NAMEBRIANT, ANGELILLO NO.:  [id number]   MEDICAL RECORD NO.:  [id number]          PATIENT TYPE:  INP   LOCATION:  A213                          FACILITY:  [facility]   PHYSICIAN:  Angus G. Renard Matter, MD   DATE OF BIRTH:  [birth year]   DATE OF PROCEDURE:  03/06/2005  DATE OF DISCHARGE:                                   PROGRESS NOTE   SUBJECTIVE:  This patient had surgery, cholelithiasis, cholecystitis for  gangrene of gallbladder.  Procedure was laparoscopic cholecystectomy.  His  condition has remained stabled since surgery.   OBJECTIVE:  VITAL SIGNS:  Blood pressure 127/73, respirations 20, pulse 73,  temperature 98.  Chemistry shows a sodium of 134, potassium 3.4, chloride  101, CO2 29.  Liver enzymes:  SGOT 84, SGPT 75, bilirubin 0.5.  HEART:  Regular rhythm.  LUNGS:  Diminished breath sounds.  ABDOMEN:  The patient is tender around surgical sites on abdomen.   ASSESSMENT:  The patient is two days postop following laparoscopic  cholecystectomy for gangrenous gallbladder.   PLAN:  Continue current regimen.      Angus G. Renard Matter, MD  Electronically Signed     AGM/MEDQ  D:  03/06/2005  T:  03/07/2005  Job:  [id number]

## 2010-07-09 NOTE — H&P (Signed)
[name], [name] NO.:  [id number]   MEDICAL RECORD NO.:  [id number]          PATIENT TYPE:  INP   LOCATION:  NA                           FACILITY:  [facility]   PHYSICIAN:  Georges Lynch. Gioffre, M.D.DATE OF BIRTH:  1941/01/14   DATE OF ADMISSION:  DATE OF DISCHARGE:                                HISTORY & PHYSICAL   HISTORY OF PRESENT ILLNESS:  The patient has had back pain radiating into  both lower extremities.  He has had severe pain for the past four to six  weeks.  The pain tends to be much worse at night.  The patient was seen in  the office and evaluated with CT myelogram and was found to have severe  spinal stenosis at L3-L4 and L4-L5.  The patient elects to proceed with  decompression for pain control.   DRUG ALLERGIES:  None.   PRIMARY CARE PHYSICIAN:  Angus G. Renard Matter, MD, [place], [place].   CURRENT MEDICATIONS:  He is on Indocin SR daily.   PAST SURGICAL HISTORY:  Patient had back surgery in 2000 for herniated disc  at L4-L5 on the right by Dr. Aliene Beams.   PAST MEDICAL HISTORY:  Significant for previous herniated disc, spinal  stenosis, prostate cancer.  He had seed implants in 2000 by Dr. Margo Aye at  [facility].   FAMILY HISTORY:  Mother had coronary artery disease and arthritis.   REVIEW OF SYSTEMS:  GENERAL:  Denies weight change, fever, chills, fatigue.  HEENT:  Denies headaches, visual changes, tinnitus, hearing loss, sore  throat. CARDIOVASCULAR:  Denies chest pain, palpitations, shortness of  breath, orthopnea. PULMONARY:  Denies dyspnea, wheezing, cough, sputum  production, hemoptysis.  GASTROINTESTINAL:  Denies nausea, vomiting,  hematemesis or abdominal pain.  GENITOURINARY:  Denies dysuria, frequency,  urgency, hematuria.  ENDOCRINE:  Denies polyuria, polydipsia, appetite  change, heat or cold intolerance.  MUSCULOSKELETAL:  Patient has back pain  radiating into both legs.  NEUROLOGICAL:  Denies dizziness, vertigo,  syncope, seizures.  SKIN:  Denies itching, rashes, masses or moles.   PHYSICAL EXAMINATION:  VITAL SIGNS:  Temperature 98.0, pulse 62,  respirations 18, blood pressure 140/90, right arm sitting.  GENERAL APPEARANCE:  70 year old male in no acute distress.  HEENT:  Pupils equal, round, reactive to light. Extraocular movements  intact.  Pharynx clear.  NECK:  Supple.  CHEST:  Clear to auscultation bilaterally with no wheezing, rales or rhonchi  noted.  HEART:  Regular rate and rhythm without murmurs.  ABDOMEN:  Positive bowel sounds, soft, nontender.  MUSCULOSKELETAL:  Examination of the back reveals very painful range of  motion.  He is unable to stand completely upright due to the back pain.  NEUROLOGICAL:  He is neurologically intact.  SKIN:  Warm and dry.   CLINICAL DATA:  CT myelogram of the lumbar spine reveals significant spinal  stenosis L3-L4 and L4-L5.   IMPRESSION:  Spinal stenosis.   PLAN:  The patient is to be [facility] to [facility] for central  decompression L3-L4 and L4-L5 for spinal stenosis.  Haynes Hoehn  D:  09/23/2004  T:  09/23/2004  Job:  [id number]

## 2010-07-09 NOTE — H&P (Signed)
   NAME:  [name], [name]                             ACCOUNT NO.:   MEDICAL RECORD NO.:  [id number]                   PATIENT TYPE:  OUT   LOCATION:  RAD                                  FACILITY:  [facility]   PHYSICIAN:  Dalia Heading, M.D.               DATE OF BIRTH:  [birth year]   DATE OF ADMISSION:  DATE OF DISCHARGE:                                HISTORY & PHYSICAL   CHIEF COMPLAINT:  Hematochezia, epigastric pain.   HISTORY OF PRESENT ILLNESS:  The patient is a 70 year old white male who was  referred back for a two week history of hematochezia, lower abdominal  cramping, and epigastric pain and belching.  He had an EGD several years ago  and was treated for H. pylori infection.  A colonoscopy in the past revealed  colon polyps.  He is status post radiation treatment for prostate carcinoma.  No nausea or vomiting have been noted.  The primary care physician on rectal  examination did not feel a hemorrhoid.  The patient was concerned about his  lower colon due to the history of radiation therapy.   PAST MEDICAL HISTORY:  As noted above, extrinsic allergies.   PAST SURGICAL HISTORY:  Hemorrhoidectomy, testicular surgery, colon polyp  removal approximately two years ago.   CURRENT MEDICATIONS:  None.   ALLERGIES:  No known drug allergies.   REVIEW OF SYMPTOMS:  Unremarkable.   PHYSICAL EXAMINATION:  GENERAL:  The patient is a well-developed, well-  nourished white male in no acute distress.  VITAL SIGNS:  He is afebrile.  Vital signs are stable.  LUNGS:  Clear to auscultation with equal breath sounds bilaterally.  HEART:  Regular rate and rhythm without S3, S4, or murmurs.  ABDOMEN:  Soft, nontender, nondistended abdomen.  No hepatosplenomegaly or  masses are noted.  RECTAL:  Deferred until the procedure.   IMPRESSION:  Hematochezia, epigastric pain, history of colon polyps, history  of prostate carcinoma.    PLAN:  The patient was scheduled for an EGD and colonoscopy on  November 06, 2001.  The risks and benefits of the procedures including bleeding and  perforation were fully explained to the patient, gave informed consent.                                               Dalia Heading, M.D.    MAJ/MEDQ  D:  10/30/2001  T:  10/30/2001  Job:  [id number]   cc:   Angus G. Renard Matter, M.D.

## 2010-07-09 NOTE — Group Therapy Note (Signed)
NAMEION, GONNELLA NO.:  [id number]   MEDICAL RECORD NO.:  [id number]          PATIENT TYPE:  INP   LOCATION:  IC07                          FACILITY:  [facility]   PHYSICIAN:  Angus G. Renard Matter, MD   DATE OF BIRTH:  April 27, 1940   DATE OF PROCEDURE:  DATE OF DISCHARGE:                                   PROGRESS NOTE   SUBJECTIVE:  This patient was taken to surgery by Dr. Lovell Sheehan yesterday.  He  had laparoscopic cholecystectomy.  His condition remained stable since  surgery.  He currently is sedated and received IV pain medication.   OBJECTIVE:  VITAL SIGNS:  Blood pressure 132/70, respirations 20, pulse 90.  HEART:  Regular rhythm.  LUNGS:  Clear.  ABDOMEN:  The patient has abdominal incisions and tenderness around sites of  incisions.   ASSESSMENT:  The patient was taken to surgery by Dr. Lovell Sheehan and had  laparoscopic cholecystectomy for gangrenous gallbladder.   PLAN:  Repeat LFT's today.  Continue current regimen.      Angus G. Renard Matter, MD  Electronically Signed     AGM/MEDQ  D:  03/04/2005  T:  03/04/2005  Job:  [id number]

## 2010-07-09 NOTE — Group Therapy Note (Signed)
NAME:  [name], [name]                   ACCOUNT NO.:  [id number]   MEDICAL RECORD NO.:  [id number]           PATIENT TYPE:  INP   LOCATION:  A331                          FACILITY:  [facility]   PHYSICIAN:  Angus G. Renard Matter, MD   DATE OF BIRTH:  11-11-1940   DATE OF PROCEDURE:  DATE OF DISCHARGE:                                   PROGRESS NOTE   This patient was admitted with epigastric pain.  Ultrasound showed a mildly  distended gallbladder but no stones.  He is being seen by Dr. Jena Gauss who  plans an EGD for today.  He was seen by cardiology.  It was felt that he did  not have evidence of acute coronary syndrome.  A chest CT did not show  evidence of pulmonary emboli but atelectasis and scarring of right lower  lobe.  His pain is being controlled with Dilaudid.   OBJECTIVE:  Blood pressure 116/77, respirations 20, pulse 87, temp 99.7.  The patient appears to be slightly jaundiced.  HEART:  Regular rhythm.  ABDOMEN:  Slight tenderness in the upper abdomen.   His most recent blood gases show a pO2 of 59.1 and a pCO2 of 47.9, pH of  7.395.  Bilirubin 4.8, SGOT 133, SGPT 170, alkaline phosphatase 133.   ASSESSMENT:  The patient is being evaluated for upper abdominal pain.  He  does have impaired liver function.  He is scheduled for EGD today and  further evaluation by gastroenterology service.      Angus G. Renard Matter, MD  Electronically Signed     AGM/MEDQ  D:  03/02/2005  T:  03/02/2005  Job:  [id number]

## 2010-07-09 NOTE — Op Note (Signed)
NAMETRAETON, BORDAS NO.:  [id number]   MEDICAL RECORD NO.:  [id number]          PATIENT TYPE:  INP   LOCATION:  1610                         FACILITY:  [facility]   PHYSICIAN:  Georges Lynch. Gioffre, M.D.DATE OF BIRTH:  1940-07-24   DATE OF PROCEDURE:  09/24/2004  DATE OF DISCHARGE:                                 OPERATIVE REPORT   PREOPERATIVE DIAGNOSES:  1.  Severe spinal stenosis at L3-4.  2.  Severe spinal stenosis at L4-5.  3.  Previous hemilaminectomy, microdiskectomy at L4-5 on the left by Dr.      Gerlene Fee in the past.   POSTOPERATIVE DIAGNOSES.:  1.  Severe spinal stenosis at L3-4.  2.  Severe spinal stenosis at L4-5.  3.  Previous hemilaminectomy, microdiskectomy at L4-5 on the left by Dr.      Gerlene Fee in the past.   OPERATION:  1.  Bilateral decompressive lumbar laminectomy and L3-4.  2.  Bilateral decompressive lumbar laminectomy at L3-4, L4-5 for spinal      stenosis at both levels.  3.  Foraminotomies at both levels bilaterally.   DESCRIPTION OF PROCEDURE:  Under general anesthesia, the patient first had 1  g of IV Ancef.  He was placed on the spinal frame.  A routine orthopedic  prep and draping of his back was carried out. Two x-rays were taken for  localization purposes.  At the initial x-ray, an incision was made over the  L3-4, L4-5 spaces.  Bleeders were identified and cauterized and the self-  retaining retractors were inserted.  Following that, the incision was  carried down to the lamina.  The muscle was stripped from the spinous  processes and lamina bilaterally. Another x-ray was taken to verify our  position so we did verify the position with the radiologist. We then  identified the L3 and L4 spinous processes and removed those with the  Kerrison rongeurs.  We then went down and then carried out our  hemilaminectomies in the usual fashion.  Note, his bone was very thick.  His  canal was extremely narrow.  I had to utilized the bur to  thin out the bone  first before we could proceed with the laminectomies. Following that, we  then went down and identified the thickened ligamentum flavum and removed  the ligamentum flavum with great care taken not to injure the dura.  We did  use the microscope during the procedure.  We opened up the canal of all the  way distal to the and  slightly past the L4-5 interspace.  We then went out  and did foraminotomies bilaterally.  The dura now was free.  The roots were  free.  We thoroughly irrigated out the area, removed the fluid and loosely  applied some thrombin-soaked Gelfoam.  We then closed the wound in layers in  the usual fashion except the proximal deep portion of the wound was left  open for drainage purposes.  The remaining part of wound was closed as I  mentioned in the usual fashion.  The skin was closed metal staples.  A  sterile Neosporin dressing was applied.  He left the operating room in  satisfactory condition.      RAG/MEDQ  D:  09/24/2004  T:  09/25/2004  Job:  [id number]

## 2010-07-09 NOTE — H&P (Signed)
NAMECOLBI, STAUBS NO.:  [id number]   MEDICAL RECORD NO.:  [id number]          PATIENT TYPE:  INP   LOCATION:  5013                         FACILITY:  [facility]   PHYSICIAN:  [name], M.D.        DATE OF BIRTH:  07/09/1940   DATE OF ADMISSION:  03/29/2006  DATE OF DISCHARGE:  04/03/2006                              HISTORY & PHYSICAL   CHIEF COMPLAINT:  Low back and bilateral lower extremity pain left  greater than right.   HISTORY:  Patient is a 70 year old male who has had increasing problems  with his back.  He has gotten to the point that he is having some severe  pain.  He is walking in a hunched over position.  He states he walks  like a monkey.  His is very frustrated with his inability to standup  straight and also the pain he is having in his back and into this legs.  Risks and benefits of the proposed surgery were discussed with the  patient by Dr. Noel Gerold as well as myself.  He indicated an understanding  and opted to proceed.   ALLERGIES:  None.   MEDICATIONS:  None.   PAST MEDICAL HISTORY:  Prostate cancer.   PAST SURGICAL HISTORY:  1. Cholecystectomy in January 2007.  2. Lumbar surgery x2.  First one by Dr. __________.  Second one by Dr.      Darrelyn Hillock.   SOCIAL HISTORY:  Patient is married.  Retired.  Denies tobacco abuse.  Denies alcohol use.  His wife will be available to help him  postoperatively.   FAMILY MEDICAL HISTORY:  Noncontributory.   REVIEW OF SYSTEMS:  Negative.   PHYSICAL EXAMINATION:  GENERAL:  The patient is a 70 year old male who  is alert and oriented in no acute distress.  He is well-nourished, well  groomed.  Appears his age.  Pleasant and cooperative with the exam.  Respirations 16 and unlabored.  Pulse is 86 and regular.  HEENT:  Head is normocephalic, atraumatic.  Pupils equal round and  reactive.  Extraocular movement intact.  Nares pink.  Oropharynx clear.  NECK:  Soft to palpation.  No lymphadenopathy,  thyromegaly or bruits  appreciated.  CHEST:  Clear to auscultation bilaterally.  No rales, rhonchi, stridor,  wheezes or friction rubs.  HEART:  S1, S2.  Regular rate and rhythm.  No murmurs, gallops, or rubs  noted.  ABDOMEN:  Soft to palpation.  Nontender.  Nondistended.  No organomegaly  noted.  GU:  Not pertinent and not performed.  EXTREMITIES:  As per history of present illness.  SKIN:  Is intact.  No evidence of lesions or rashes.   IMPRESSION:  1. L2-S1 spinal stenosis and spondylolisthesis.  2. History of prostate cancer.   PLAN:  Admit to [facility] on March 29, 2006, for a L2-S1  revision laminectomy and posterior spinal fusion.  Surgery will be done  by Dr. Noel Gerold.      [name], P.A.      [name], M.D.  Electronically Signed  CM/MEDQ  D:  04/04/2006  T:  04/05/2006  Job:  [id number]

## 2010-07-09 NOTE — Group Therapy Note (Signed)
NAMECAVIN, LONGMAN                   ACCOUNT NO.:  [id number]   MEDICAL RECORD NO.:  [id number]          PATIENT TYPE:  INP   LOCATION:  A331                          FACILITY:  [facility]   PHYSICIAN:  Angus G. Renard Matter, MD   DATE OF BIRTH:  10/20/1940   DATE OF PROCEDURE:  03/03/2005  DATE OF DISCHARGE:                                   PROGRESS NOTE   SUBJECTIVE:  This patient was admitted with epigastric pain. Ultrasound  showed a mildly dilated gallbladder. The patient had EGD done by Dr. Jena Gauss,  and it was noted that the patient had small stones. Sphincter of Oddi was  dilated. Discussion was held with Dr. Jena Gauss who felt the patient needed a  cholecystectomy. The patient is still running a low grade fever but is  fairly comfortable. Most recent laboratory study shows a white count of  8,400 with hemoglobin of 12.9, hematocrit 37.4. Liver function study shows a  bilirubin of 2.6, alkaline phosphatase 89, SGOT 92, SGPT 105, amylase 43,   OBJECTIVE:  VITAL SIGNS:  Blood pressure 141/77, respirations 16, pulse 91,  temperature 100.3.  GENERAL:  The patient has generalized weakness.  HEART:  Episodes of tachycardia.  ABDOMEN:  Slight tenderness in upper abdomen.   ASSESSMENT:  The patient was admitted with epigastric pain. Does have  impaired liver function and gallbladder disease with stones.   PLAN:  Plan to continue current regimen. Dr. Lovell Sheehan has been consulted and  plans to do laparoscopic cholecystectomy on Friday.      Angus G. Renard Matter, MD  Electronically Signed     AGM/MEDQ  D:  03/03/2005  T:  03/03/2005  Job:  [id number]

## 2010-07-09 NOTE — Discharge Summary (Signed)
NAME:  [name], [name] NO.:  [id number]   MEDICAL RECORD NO.:  [id number]          PATIENT TYPE:  INP   LOCATION:  A306                          FACILITY:  [facility]   PHYSICIAN:  Dalia Heading, M.D.  DATE OF BIRTH:  05-25-1940   DATE OF ADMISSION:  03/11/2005  DATE OF DISCHARGE:  01/25/2007LH                                 DISCHARGE SUMMARY   HOSPITAL COURSE SUMMARY:  The patient is a 70 year old white male status  post laparoscopic cholecystectomy for gangrene of the gallbladder on March 04, 2005 who presented back to [facility] with worsening fevers and  right upper quadrant abdominal pain. He was found on CT scan of the abdomen  to have a subhepatic fluid collection consistent with an abscess. He was  sent to [facility] Department on March 13, 2005 and  underwent CT-guided drainage of the subhepatic abscess. He still did have a  fluid collection in the subphrenic space above the liver, though this was  not drained. Initial cultures revealed E-coli. He was on Zosyn for which E-  coli was sensitive to. A follow-up CAT scan was performed which revealed  resolution of the some hepatic abscess, though he still had evidence of  subphrenic fluid collection. He was sent back down to [facility]  on March 16, 2005, but they were unable to significantly drain that fluid  collection. His white count has decreased almost to normal. He has  defervesced nicely. Initial gram stain of the second tapping was negative  for organisms. It is felt this may be a sterile fluid collection.   The patient is being discharged home on March 17, 2005 in good and  improving condition with the drain in place.   DISCHARGE INSTRUCTIONS:  The patient is to follow up with Dr. Franky Macho  on March 22, 2005. Discharge medications include Keflex 500 mg p.o.  t.i.d., Tylox 1 tablet p.o. q.4h. p.r.n. pain. Home health nurse will be  consulted to check  his wounds and the drainage system daily.   PRINCIPAL DIAGNOSIS:  1.  Intra-abdominal abscess.  2.  Status post laparoscopic cholecystectomy for gangrene of the      gallbladder.   PRINCIPAL PROCEDURE:  CT-guided drainage of intra-abdominal abscesses on  March 13, 2005 and March 16, 2005.      Dalia Heading, M.D.  Electronically Signed     MAJ/MEDQ  D:  03/17/2005  T:  03/17/2005  Job:  [id number]   cc:   Angus G. Renard Matter, MD  Fax: [phone]

## 2010-07-09 NOTE — Op Note (Signed)
NAME:  TUAN, TIPPIN                   ACCOUNT NO.:  [id number]   MEDICAL RECORD NO.:  [id number]          PATIENT TYPE:  INP   LOCATION:  A331                          FACILITY:  [facility]   PHYSICIAN:  R. Roetta Sessions, M.D. DATE OF BIRTH:  1941-01-09   DATE OF PROCEDURE:  03/02/2005  DATE OF DISCHARGE:                                 OPERATIVE REPORT   PROCEDURE:  Endoscopic retrograde cholangiopancreatography with  sphincterotomy, stone retrieval.   INDICATIONS FOR PROCEDURE:  The patient is a 70 year old gentleman who  developed acute illness characterized by subxiphoid abdominal pain which was  waxed and waned and has been persistent now for the past four days. He has  been febrile to 101-102. We have seen a significant bump in his  aminotransferases and bilirubin since admission. CT and ultrasound  demonstrate some stranding around the gallbladder, and gallbladder was  distended/dilated without thickening of the gallbladder wall or obvious  stone. The bile duct was not dilated. ERCP is now being done. He did have  some hypoxemia which has been evaluated by cardiology and had some  atelectasis on spiral CT but no PE. Troponins were minimally elevated, but  this was felt to be nonspecific and not related to coronary ischemia.  Echocardiogram showed no significant abnormalities. EKG was without acute  change. ERCP is now being done to decompress his biliary tree. This approach  has been discussed with the patient at length. Potential risks, benefits,  and alternatives have been reviewed with the patient and the patient's  family. We talked about the risks including a 1 in 10 chance of  pancreatitis, reaction to medications, bleeding, perforation, potential for  failed procedure and the potential for stenting which would necessitate a  subsequent procedure. All parties' questions were answered. All parties were  agreeable.   PROCEDURE NOTE:  The patient was placed in a semi-prone  position on the OR  table after general endotracheal anesthesia was introduced by Dr. Jayme Cloud  and associates. The patient was receiving IV Unasyn already.   INSTRUMENT:  Olympus video chip system.   FINDINGS:  Cursory examination of the distal esophagus, stomach and duodenum  to the second portion revealed no esophageal or gastric mucosa  abnormalities. The scope was pulled back to the short position 55 cm from  the incisors. The ampulla of Vater was identified on the medial wall of the  second portion of the duodenum. There was a good size duodenal diverticulum  adjacent to it. Please see photos. Scout film was taken subsequently using  the Microvasive sphincterotomy. The bile duct was cannulated initially with  guidewire palpation. Deep cannulation of the bile duct was achieved with the  wire. The sphincterotome followed up, and cholangiogram was obtained. The  gallbladder did not fill. The intrahepatic bile ducts appeared normal as did  the common hepatic/common bile duct. There were no obvious filling defects  seen on cholangiogram. However, I did note some small pieces of gravel at  the ampullary orifice with moving back and forth of the sphincterotome. I  elected to go ahead and performed  a sphincterotomy. I pulled the  sphincterotome back to the 12 o'clock position with the guide wire being in  good position. A sphincterotomy was performed. This was done without  difficulty. A few more pieces of gravel were seen flowing through the  ampullary orifice when this was done. Subsequently, graduated balloon was  railed over the guide wire to the confluence. It was inflated to accommodate  the size of the duct. Balloon occlusion cholangiogram was performed. This  was done without difficulty and pulled through the ampullary orifice easily  without additional stone fragments being seen. There was very good drainage  of the biliary tree following this maneuver. Again, the gallbladder did  not  fill. The pancreatic duct was not manipulated. The patient tolerated the  procedure well and will be taken to the PACU.   IMPRESSION:  1.  Normal appearing biliary tree (gallbladder not imaged), status post      sphincterotomy with recovery of small pieces of stone material through      the ampullary orifice status post balloon occlusion cholangiogram.  2.  Excellent drainage of biliary tree following the above maneuvers.  3.  Pancreatic duct not manipulated.   RECOMMENDATIONS:  1.  Will continue Unasyn for the time being.  2.  I think Mr. Robbins needs to go ahead and get his gallbladder out. He has      asked for Dr. Lovell Sheehan. I discussed the case with McInnis. We will go      ahead and consult Dr. Lovell Sheehan to see Mr. Mowrer.      Jonathon Bellows, M.D.  Electronically Signed     RMR/MEDQ  D:  03/02/2005  T:  03/02/2005  Job:  [id number]   cc:   Rhae Lerner. Margretta Ditty, M.D.  [address]  [place]  [place] [address]

## 2010-07-09 NOTE — Discharge Summary (Signed)
[name], HONSE NO.:  [id number]   MEDICAL RECORD NO.:  [id number]          PATIENT TYPE:  INP   LOCATION:  A213                          FACILITY:  [facility]   PHYSICIAN:  Dalia Heading, M.D.  DATE OF BIRTH:  03-Jun-1940   DATE OF ADMISSION:  02/28/2005  DATE OF DISCHARGE:  01/15/2007LH                                 DISCHARGE SUMMARY   HOSPITAL COURSE SUMMARY:  The patient is a 70 year old white male who  presented to the emergency room with epigastric pain, hypoxemia, and  elevated cardiac markers. He was brought into the hospital for further  evaluation and treatment. Gastroenterology was initially consulted and it  was felt the patient's pain was atypical for ulcer disease. His LFTs were  somewhat unremarkable at the time of initial examination. His LFTs increased  the following day and he was noted to have a total bilirubin that was  increasing. There is a suspicion that the patient may have a partial biliary  obstruction. An ERCP was planned. Cardiology was also consulted and the  patient was felt to have hypoxemia secondary to sepsis. An echo was  performed which was negative. He also had a CT angio which was negative for  pulmonary embolus. The patient underwent ERCP by Dr. Jena Gauss on March 02, 2005 and was found to have multiple sludge and small pieces of stone  material within the common bile duct. A surgery consultation was obtained  and the patient was taken to the operating room the following day due to  worsening condition. He underwent laparoscopic cholecystectomy. He was found  to have a gangrenous gallbladder at that time. His hypoxemia had worsened.  At the time of surgery, he was noted to have significant pulmonary  secretions. He was extubated postoperatively but went to the intensive care  unit for further management. While in the intensive care unit, he slowly  improved. He was put on Ventolin nebulizers and monitored closely. His diet  was advanced without difficulty once his bowel function returned. His liver  enzyme tests have been improving throughout his stay. His Jackson-Pratt  drain was removed on March 07, 2005. The patient is being discharged home  on March 07, 2005 in good and improving condition.   DISCHARGE INSTRUCTIONS:  The patient is to follow up with Dr. Franky Macho  on March 10, 2005.  Discharge medications include ciprofloxacin 500 mg  p.o. twice a day x1 week and Darvocet-N 100 one to two tablets p.o. q.4 h  p.r.n. pain.   PRINCIPAL DIAGNOSES:  1.  Cholecystitis, cholelithiasis, gangrene of the gallbladder.  2.  Hypoxemia, resolved.  3.  Sepsis, resolved.  4.  Questionable history of coronary artery disease.   PRINCIPAL PROCEDURES:  1.  ERCP with sphincterotomy, stone extraction by Dr. Jena Gauss on March 02, 2005.  2.  Laparoscopic cholecystectomy by Dr. Franky Macho on March 04, 2005.      Dalia Heading, M.D.  Electronically Signed     MAJ/MEDQ  D:  03/07/2005  T:  03/07/2005  Job:  [id number]   cc:   R. Roetta Sessions, M.D.  [address]  [place]   [address]   Angus G. Renard Matter, MD  Fax: [phone]   Bryant Bing, M.D. [facility]  [address]  [address]  [place]  [place] [address]

## 2010-07-09 NOTE — Op Note (Signed)
NAMEACEA, YAGI NO.:  [id number]   MEDICAL RECORD NO.:  [id number]          PATIENT TYPE:  INP   LOCATION:  IC07                          FACILITY:  [facility]   PHYSICIAN:  Dalia Heading, M.D.  DATE OF BIRTH:  September 07, 1940   DATE OF PROCEDURE:  03/04/2005  DATE OF DISCHARGE:                                 OPERATIVE REPORT   PREOPERATIVE DIAGNOSIS:  Cholecystitis, cholelithiasis.   POSTOPERATIVE DIAGNOSIS:  Cholecystitis, cholelithiasis, gangrene of the  gallbladder.   PROCEDURE:  Laparoscopic cholecystectomy.   SURGEON:  Dr. Franky Macho.   ANESTHESIA:  General endotracheal.   INDICATIONS:  The patient is a 70 year old white male with multiple medical  problems who underwent ERCP yesterday by Dr. Jena Gauss and was found to have  sludge in the common bile duct. Sphincterotomy was done at that time. A  surgery consultation was obtained, and the patient was going to be operated  on March 04, 2004. His condition over the last 24 hours has deteriorated,  and there is concern that he has gangrene of the gallbladder. He now come to  the operating for laparoscopy cholecystectomy, possible open. Risks and  benefits of the procedure including bleeding, infection, hepatobiliary  injury, and the possibility of an open procedure were fully explained to the  patient, who gave informed consent.   PROCEDURE NOTE:  The patient was placed in the supine position. After  induction of general endotracheal anesthesia, the abdomen was prepped and  draped using the usual sterile technique with Betadine. Surgical site  confirmation was performed.   The supraumbilical incision was made down to the fascia. Veress needle was  introduced into the abdominal cavity, and confirmation of placement was done  using the saline drop test. The abdomen was then insufflated to 16 mmHg  pressure. An 11-mm trocar was introduced into the abdominal cavity under  direct visualization without  difficulty. An additional 11-mm trocar was  placed in the epigastric region, and 5-mm trocars were placed in the right  upper quadrant and right flank regions. The liver was inspected and noted to  be within normal limits. The transverse colon had attached itself to the  gallbladder wall. This was freed away bluntly. The gallbladder lumen was  then aspirated with a needle to decompress it. There was evidence of  gangrene of the wall of the gallbladder down to the cystic duct. The cystic  duct was first identified. Its juncture to the infundibulum fully  identified. The cystic duct was very friable. Multiple clips were placed  proximally and distally on the cystic duct, and cystic duct was divided.  This was likewise done to the cystic artery. Gallbladder was then freed away  from the gallbladder fossa using Bovie electrocautery. The gallbladder was  delivered through the epigastric trocar site using EndoCatch bag. The  gallbladder fossa was inspected, and any bleeding was controlled using Bovie  electrocautery. A #10 flat Jackson-Pratt drain was placed into the  subhepatic space and brought out through one of the 5-mm trocar sites. All  fluid and air then evacuated from  the abdominal cavity prior to removal of  the trocars.   All wounds were irrigated with normal saline. All wounds were injected with  0.5% Sensorcaine. The Jackson-Pratt drain was secured into place using a 3-0  nylon interrupted suture. The supraumbilical as well as epigastric fascia  were reapproximated using 0 Vicryl interrupted sutures. All skin incisions  were closed using staples. Betadine ointment and dry sterile dressings were  applied.   All tape and needle counts were correct at the end of the procedure. The  patient was extubated in the operating room and went back to recovery room  in guarded but stable condition.   COMPLICATIONS:  None.   SPECIMENS:  Gallbladder with stones.   BLOOD LOSS:  100 cc.    DRAINS:  Jackson-Pratt drain in the subhepatic space.      Dalia Heading, M.D.  Electronically Signed     MAJ/MEDQ  D:  03/03/2005  T:  03/04/2005  Job:  [id number]

## 2010-07-09 NOTE — Procedures (Signed)
NAMEDASHAUN, ONSTOTT NO.:  [id number]   MEDICAL RECORD NO.:  [id number]          PATIENT TYPE:  INP   LOCATION:  A331                          FACILITY:  [facility]   PHYSICIAN:  Albion Bing, M.D. [facility] OF BIRTH:  [birth year]   DATE OF PROCEDURE:  03/02/2004  DATE OF DISCHARGE:                                  ECHOCARDIOGRAM   PROCEDURE:  Echocardiogram.   CLINICAL DATA:  A 70 year old gentleman with abnormal cardiac markers and  hypoxemia.  M-mode aorta 3.7, left atrium 3.6, septum 1.5, posterior wall  1.4, LV diastole 3.5, LV systole 2.5.   1.  Technically suboptimal but adequate echocardiographic study.  2.  Normal left atrium and right atrium. Right ventricular size at the upper      limit of normal; no RVH; normal RV systolic function.  3.  Very mild sclerosis of a trileaflet aortic valve.  4.  Normal mitral valve; mild annular calcification.  5.  Tricuspid valve not ideally imaged - grossly normal.  6.  Pulmonic valve and proximal pulmonary artery poorly imaged; no      significant abnormalities appreciated.  7.  Normal left ventricular size; mild concentric hypertrophy; normal      regional and global function.      Cheswold Bing, M.D. [facility]  Electronically Signed     RR/MEDQ  D:  03/01/2005  T:  03/02/2005  Job:  [id number]

## 2010-07-09 NOTE — Discharge Summary (Signed)
[name], GARMAN NO.:  [id number]   MEDICAL RECORD NO.:  [id number]          PATIENT TYPE:  INP   LOCATION:  1610                         FACILITY:  [facility]   PHYSICIAN:  Georges Lynch. Gioffre, M.D.DATE OF BIRTH:  [birth year]   DATE OF ADMISSION:  09/24/2004  DATE OF DISCHARGE:  09/26/2004                                 DISCHARGE SUMMARY   ADMISSION DIAGNOSES:  1.  Severe spinal stenosis.  2.  Prostate cancer.   DISCHARGE DIAGNOSES:  1.  Severe spinal stenosis status post central decompression L3-4, L4-5.  2.  History of prostate cancer.   PROCEDURES:  The patient was taken to the operating room on September 24, 2004,  to undergo central decompression at L3-4 and L4-5 for spinal stenosis.  Surgery Ranee Gosselin, M.D., assistant, Marlowe Kays, M.D.  Surgery was  performed under general anesthesia.   BRIEF HISTORY:  This is a 70 year old male who has had back pain radiating  into both lower extremities.  He has had severe pain for the past 4-6 weeks.  Pain tends to be much worse at night.  The patient was evaluated in the  office and had CT myelogram which found him to have severe spinal stenosis  at L3-4 and L4-5, and the patient elected to proceed with surgery for pain  control.  Was admitted to the hospital for same.   LABORATORY DATA:  Admission CBC:  WBC 3.5, RBC 4.88, hemoglobin 15.1,  hematocrit 44.5, platelet count 185,000.  Admission PT 12.7, INR 0.9, PTT  26.  Admission chemistry, unremarkable, except for a glucose of 183 which  was elevated.  Urinalysis was normal.  Admission EKG sinus rhythm with first  degree AV block, left axis deviation with a rate of 74.  Preoperative chest  x-ray:  No evidence of active disease.   HOSPITAL COURSE:  The patient was taken to the operating room.  He underwent  the above stated procedure.  He tolerated the procedure well and was allowed  to return to the recovery room, then the orthopedic floor to continue his  postoperative care.  The patient's pain was well controlled, and he was  placed on PC analgesia for pain control.  Pain was well controlled.  He was  weaned off of the oral analgesics and the PCA was discontinued on  postoperative day #1.  The patient ambulated with the aide of a walker and  PT.  He was doing very well and felt that he could be discharged home.  The  patient was discharged home on day #2, September 26, 2004.  Condition at time of  discharge was stable.   DISCHARGE MEDICATIONS:  1.  Robaxin 500 mg one q.6 h p.r.n. spasm.  2.  Mepergan Fortis 1-2 q.4 h p.r.n. pain.   FOLLOWUP:  The patient will follow up with Dr. Darrelyn Hillock in the office two  weeks from the date of surgery.  He will call to schedule the appointment.   WOUND CARE:  He will change his dressing daily, keep the wound dry and  clean.  Ebbie Ridge. Paitsel, P.A.    ______________________________  Georges Lynch Darrelyn Hillock, M.D.    [place] Dome  D:  10/20/2004  T:  10/20/2004  Job:  [id number]

## 2010-07-09 NOTE — H&P (Signed)
[name], KAUTZMAN NO.:  [id number]   MEDICAL RECORD NO.:  [id number]          PATIENT TYPE:  INP   LOCATION:  A306                          FACILITY:  [facility]   PHYSICIAN:  Dalia Heading, M.D.  DATE OF BIRTH:  1940-08-25   DATE OF ADMISSION:  03/11/2005  DATE OF DISCHARGE:  LH                                HISTORY & PHYSICAL   CHIEF COMPLAINT:  Upper abdominal pain and nausea.   HISTORY OF PRESENT ILLNESS:  The patient is a 70 year old white male status  post a laparoscopic cholecystectomy for gangrene in the gallbladder on  March 04, 2005 who now presents with fever, chills, and upper abdominal  pain.  A white blood cell count was done as an outpatient which was elevated  at 17.6.  Liver enzyme tests were remarkable only for an SGOT of 47, SGPT  51, total bilirubin 1.1.  CT scan of the abdomen and pelvis was performed  which revealed a right subphrenic fluid collection and fluid within the  gallbladder fossa.  There is the concern that this may be an intra-abdominal  abscess and the patient is being admitted to the hospital for further  evaluation and treatment.   PAST MEDICAL HISTORY:  Past medical history is as noted above, of note is  the fact the patient underwent ERCP with sphincterotomy by Dr. Jena Gauss on  March 03, 2005.  He had a cardiac workup prior to that which was for the  most part unremarkable.  The patient had been discharged on March 07, 2005  in good and improving condition.   PAST SURGICAL HISTORY:  As noted above.   CURRENT MEDICATIONS:  Ciprofloxacin, Darvocet.   ALLERGIES:  No known drug allergies.   REVIEW OF SYSTEMS:  Noncontributory.   PHYSICAL EXAMINATION:  The patient is a well-developed, well-nourished white  male who appears tired.  T-max 101, vital signs stable.  Lungs clear to  auscultation with minimal expiratory wheezing.  Heart examination reveals a  regular rate and rhythm without S3, S4, or murmurs.  The abdomen  is soft  with mild tenderness noted in the right upper quadrant.  No significant  distension is noted.  No rigidity is noted.   LABORATORIES AND CT RESULTS:  As noted above.   IMPRESSION:  Intra-abdominal abscess, status post laparoscopic  cholecystectomy for gangrenous cholecystitis.   PLAN:  The patient will be admitted to the hospital for intravenous  antibiotic therapy and control of his fevers.  He will be undergoing a CT-  guided drainage of his intra-abdominal abscess at [facility] tomorrow.  The risks  and benefits were fully explained to the patient, who gave informed consent.      Dalia Heading, M.D.  Electronically Signed     MAJ/MEDQ  D:  03/11/2005  T:  03/11/2005  Job:  [id number]   cc:   R. Roetta Sessions, M.D.  [address]  [place]  [place] [address]   Angus G. Renard Matter, MD  Fax: [phone]

## 2010-07-09 NOTE — Group Therapy Note (Signed)
NAMETYJUAN, DEMETRO                   ACCOUNT NO.:  [id number]   MEDICAL RECORD NO.:  [id number]          PATIENT TYPE:  INP   LOCATION:  A331                          FACILITY:  [facility]   PHYSICIAN:  Angus G. Renard Matter, MD   DATE OF BIRTH:  18-May-1940   DATE OF PROCEDURE:  03/01/2005  DATE OF DISCHARGE:                                   PROGRESS NOTE   SUBJECTIVE:  This patient was admitted through the ED with epigastric pain.  Ultrasound showed gallbladder mildly distended, but with no stones.  He was  seen by Dr. Jena Gauss and placed on Unasyn 1.5 g every 6 hours.  The patient did  have abnormal liver enzymes with SGOT 197, SGPT 175, Alk phos 53 and  abnormal cardiac markers with MB 3.3, relative index 2.8, troponin 0.32, now  0.41.   OBJECTIVE:  VITAL SIGNS:  Blood pressure 137/74, respirations 20, pulse 102,  temperature 98.9.  Blood gas with pCO2 38, pO2 61.4.  HEART:  Tachycardia.  ABDOMEN:  Tender epigastrium.   ASSESSMENT:  The patient was admitted with epigastric pain.  He does have  impaired liver function, abnormal cardiac markers and hypoxia.   PLAN:  Obtain cardiology consult.  Continue current regimen.  The patient  will be seen as well by cardiology and gastroenterology service.      Angus G. Renard Matter, MD  Electronically Signed     AGM/MEDQ  D:  03/01/2005  T:  03/01/2005  Job:  [id number]

## 2010-07-09 NOTE — Op Note (Signed)
NAME:  [name], TIEDT                             ACCOUNT NO.:  [id number]   MEDICAL RECORD NO.:  [id number]                   PATIENT TYPE:  AMB   LOCATION:  DAY                                  FACILITY:  [facility]   PHYSICIAN:  R. Roetta Sessions, M.D.              DATE OF BIRTH:  Dec 26, 1940   DATE OF PROCEDURE:  10/14/2003  DATE OF DISCHARGE:                                 OPERATIVE REPORT   INDICATIONS FOR PROCEDURE:  The patient is an 70 year old gentleman with  recent epigastric and bilateral lower quadrant abdominal pain, history of  colonic polyps. EGD and colonoscopy is now being done. The procedure has  been discussed with patient at length. Potential risks, benefits, and  alternatives have been reviewed and questions answered. Please see my H&P  from October 10, 2003 for more information.   PROCEDURE:  O2 saturation, blood pressure, pulse, and respirations were  monitored throughout the entirety of both procedures. Conscious sedation  from both procedures Versed 4 mg IV and Demerol 100 mg IV in divided doses.   INSTRUMENT:  Olympus video chip system.   FINDINGS:  Examination of tubular esophagus revealed four quadrant distal  esophageal erosions, the longest being approximately 2 cm. There was a  noncritical Schatzki's ring. There was no evidence of Barrett's esophagus.  Mucosa otherwise appeared normal. EG junction easily transversed __________.  Gastric cavity was emptied, insufflated well with air. Thorough examination  of the gastric mucosa including retroflexion of the proximal stomach and  esophagogastric junction demonstrating only small hiatal hernia. Pylorus was  patent and easily transversed. Examination of the bulb and second portion  revealed no abnormalities.   THERAPY/DIAGNOSTIC MANEUVERS:  None.   The patient tolerated the procedure well and was prepared for colonoscopy.  Digital rectal exam revealed no abnormalities. Endoscope placed. Prep was  good in the  rectal colon. Examination of the rectal mucosa including  retroflexed view of the anal verge revealed no abnormalities in the colon.  Colonic mucosa was aerated from the rectosigmoid junction through the left  transverse, right colon to the appendiceal orifice, ileocecal valve, and  cecum. These structures were well seen.   __________ all previously mentioned mucosal surfaces were again seen. The  patient was noted to have scattered sigmoid diverticula. There was a 5-mm  polyp at the splenic flexure which was cold snared and recovered through the  scope. The patient tolerated the procedure well and was __________  endoscopy.   IMPRESSION:  Esophagogastroduodenoscopy:  Distal esophageal erosions  consistent with erosive reflux esophagitis, noncritical Schatzki's ring, not  manipulated, otherwise normal esophagus. Small hiatal hernia. Otherwise  normal stomach. Normal D1 and D2.   Colonoscopy findings:  Normal rectum with scattered sigmoid diverticula,  polyp at the splenic flexure cold snared, remaining colonic mucosa appeared  normal.   RECOMMENDATIONS:  1. GERD literature provided to Mr. Kolbe. Begin Aciphex 20 mg orally  daily.  2. Diverticulosis literature provided to Mr. Dabney.  3. Begin fiber supplement in the way of Metamucil or Citrucel daily.  4. Will have this nice gentleman follow up with [place] in 3 months to see how he     is doing.  5. Follow up on pathology.      ___________________________________________                                            Jonathon Bellows, M.D.   RMR/MEDQ  D:  10/14/2003  T:  10/14/2003  Job:  [id number]   cc:   Angus G. Renard Matter, M.D.  [address]  [place]  [place] [address]  Fax: [phone]

## 2010-07-09 NOTE — Consult Note (Signed)
NAME:  [name], [name]                               ACCOUNT NO.:  [id number]   MEDICAL RECORD NO.:  [id number]                   PATIENT TYPE:   LOCATION:                                       FACILITY:   PHYSICIAN:  [name], M.D.                 DATE OF BIRTH:  November 10, 1940   DATE OF CONSULTATION:  10/10/2003  DATE OF DISCHARGE:                                   CONSULTATION   CONSULTING PHYSICIAN:  [name], M.D.   REFERRING PHYSICIAN:  Angus G. Renard [name], M.D.   REASON FOR CONSULTATION:  Epigastric pain, need colonoscopy.   HISTORY OF PRESENT ILLNESS:  [name] is a 70 year old gentleman who  presents today for further evaluation of the above stated symptoms at the  request of Dr. Butch [name].  He has been seen in the past by [name] and  [name] for chronic lower abdominal pain.  It was felt that this may be  due to IBS.  He was last seen in the office in 1997.  At that time he was  referred to a tertiary referral center for his lower abdominal pain.  He was  seen at [facility].  There they felt his  chronic abdominal pain was most likely due to functional overlay, i.e., IBS.  He has a history of gastroesophageal reflux disease with mild grade I  esophagitis, H. pylori associated gastritis status post treatment, and a 3-  cm cyst or polyp of the ascending colon status post piecemeal polypectomy,  all done at [facility] by [name]  in August 1997.  Pathology of this polyp revealed tubular adenoma.  The  patient tells me he has since had a colonoscopy by [name], although it  has been more than four years ago.  He is requesting followup colonoscopy at  this time.  He complains of lower abdominal pain and epigastric pain which  has been going on for about 1-2 months.  He has nausea but no vomiting.  He  denies any heartburn symptoms.  His bowels move once daily.  Denies any  melena, rectal  bleeding, weight loss.  This pain feels like gas pain, but he  does any excessive flatulence per his report.  The pain wakes him up at  night at times.  He has tried Mylanta and Phazyme with no relief.  He denies  any dysphagia odynophagia.   CURRENT MEDICATIONS:  None.   ALLERGIES:  No known drug allergies.   PAST MEDICAL HISTORY:  1. Prostate cancer, diagnosed four years ago.  He is being treated with     radiation and seed implants.  2. History of a 3-cm sessile tubular adenoma in the ascending colon at the     time of colonoscopy by [name] in 1997.  Last colonoscopy  approximately four years ago by [name].  The patient does not recall     the results.  3. Status post hemorrhoidectomy.  4. Appendectomy.  5. Benign tongue lesion resection.   FAMILY HISTORY:  Negative for colorectal cancer or chronic GI illnesses.   SOCIAL HISTORY:  He has been married for 27 years and has three children.  He is retired.  He has never been a smoker.  Denies any alcohol use.   REVIEW OF SYSTEMS:  Please see HPI for GI.  GENERAL:  Denies any weight  loss.  CARDIOPULMONARY:  Denies any chest pain or shortness of breath.  GENITOURINARY:  Denies any dysuria or urinary hesitancy.   PHYSICAL EXAMINATION:  VITAL SIGNS:  Weight 258, height 6' 2, blood  pressure 120/70, pulse 80.  GENERAL:  Pleasant, well nourished, well developed, Caucasian male in no  acute distress.  SKIN:  Warm and dry.  No jaundice.  HEENT:  Pupils equal, round and reactive to light.  Conjunctivae are pink.  Sclerae are nonicteric.  Oropharyngeal mucosa moist and pink, no lesions,  erythema or exudate.  No lymphadenopathy, thyromegaly.  CHEST:  Lungs are clear to auscultation.  CARDIAC:  Reveals a regular rate and rhythm.  Normal S1 S2.  No murmurs,  rubs or gallops.  ABDOMEN:  Positive bowel sounds.  Soft, nondistended.  He has mild to  moderate epigastric tenderness as well as left lower quadrant tenderness to   deep palpation.  No organomegaly or masses.  No abdominal bruits.  EXTREMITIES:  No edema.   IMPRESSION:  1. [name] is a 70 year old gentleman with a two month history of     epigastric pain associated with nausea but no vomiting.  His pain has     been quite persistent.  He may have dyspepsia, gastritis, or even peptic     ulcer disease.  Previously with a CLO test positive, he underwent     treatment in 1997.  2. He also has left lower quadrant abdominal pain, abdominal bloating which     may be due to irritable bowel syndrome.  He does not have any significant     change in his bowel habits.  This is similar pain that he has had in the     remote past as well per his report.  3. History of adenomatous polyps, overdue for surveillance colonoscopy.   PLAN:  1. Colonoscopy and EGD in the near future.  Procedure to be performed by Dr.     Jena [name].  2. CBC with diff.  3. Trial of NuLev #12 samples given, one sublingual q.a.c. q.h.s. p.r.n.  4. Further recommendations to follow.   I would like to thank Dr. Butch [name] for allowing [place] to take part in the  care of this patient.     ________________________________________  ___________________________________________  Tana Coast, P.A.                         [name], M.D.   LL/MEDQ  D:  10/10/2003  T:  10/10/2003  Job:  [id number]   cc:   [name], M.D.  [address]  [place]  [place] [address]  Fax: [phone]

## 2010-07-09 NOTE — Op Note (Signed)
NAME:  [name], [name]                             ACCOUNT NO.:  [id number]   MEDICAL RECORD NO.:  [id number]                   PATIENT TYPE:  OIB   LOCATION:  3010                                 FACILITY:  [facility]   PHYSICIAN:  Reinaldo Meeker, M.D.              DATE OF BIRTH:  12/08/1940   DATE OF PROCEDURE:  09/18/2002  DATE OF DISCHARGE:                                 OPERATIVE REPORT   PREOPERATIVE DIAGNOSIS:  Herniated disk, L4-5 left.   POSTOPERATIVE DIAGNOSIS:  Herniated disk, L4-5 left.   OPERATION PERFORMED:  Left L4-5 interlaminar laminotomy for excision of  herniated disk with operating microscope.   SECONDARY PROCEDURE:  Microdissection of L4-5 disk and L5 nerve root.   SURGEON:  Reinaldo Meeker, M.D.   ASSISTANT:  Kathaleen Maser. Pool, M.D.   ANESTHESIA:  General.   DESCRIPTION OF PROCEDURE:  After being placed in prone position, the  patient's back was prepped and draped in the usual sterile fashion.  Localizing x-ray was taken prior to the incision to identify the appropriate  level.  Midline incision was made above the spinous processes of L4 and L5.  Using the Bovie cutting current, the incision was carried down to the  spinous processes.  Subperiosteal dissection was the carried out on the left  side spinous processes and lamina. McCullough self-retaining retractor was  placed for exposure.  A second x-ray was taken to confirm approach to the L4-  5 level and this was correct.  Using the high speed drill, the inferior one  half of the L4 lamina, the medial one third of the facet joint were removed.  Drill was then used to remove the superior one third of the L5 lamina.  Residual bone and ligamentum flavum were removed in a piecemeal fashion.  The microscope was draped and brought into the field and used for the  remainder of the case.  Using microdissection technique, the lateral aspect  of the thecal sac and L5 nerve were identified.  Further coagulation was  carried out down to the floor of the canal to identify the L4-5 disk which  was found to be markedly herniated.  After coagulating on the annulus, the  annulus was incised with the 15 blade.  Using pituitary rongeurs and curets,  a thorough disk space clean out was carried out and at the same time great  care was taken to avoid any injury to the neural elements and this was  successfully done.  Inspection superior and inferior to the disk space  revealed a small free fragment in each direction and this was removed as  well.  At this point inspection was carried out in all directions for any  evidence of residual compression and none could be identified.  Large  amounts of irrigation were carried out.  Any bleeding was controlled with  bipolar coagulation and Gelfoam.  The  wound was then closed using  interrupted Vicryl on muscle, fascia, subcutaneous tissues and staples on  the skin.  Sterile dressing was then applied.  The patient was then  extubated and taken to the recovery room in stable condition.                                                Reinaldo Meeker, M.D.   ROK/MEDQ  D:  09/18/2002  T:  09/18/2002  Job:  [id number]

## 2010-07-09 NOTE — Group Therapy Note (Signed)
NAMEJERMINE, BIBBEE NO.:  [id number]   MEDICAL RECORD NO.:  [id number]          PATIENT TYPE:  INP   LOCATION:  A213                          FACILITY:  [facility]   PHYSICIAN:  Angus G. Renard Matter, MD   DATE OF BIRTH:  [birth year]   DATE OF PROCEDURE:  03/07/2005  DATE OF DISCHARGE:  03/07/2005                                   PROGRESS NOTE   SUBJECTIVE:  This patient is in his fourth postop day following laparoscopic  cholecystectomy for gangrenous gallbladder.  His condition remained stable.  He apparently has been ambulating and apparently bowel function has  improved.   OBJECTIVE:  VITAL SIGNS:  Blood pressure 152/89, respirations 20, pulse 62,  temperature 97.1.  Patient's liver enzymes have improved.  LUNGS:  Clear.  HEART:  Regular rhythm.  ABDOMEN:  The patient has tenderness around incisions on abdomen.   ASSESSMENT AND PLAN:  The patient had cholecystectomy for gangrenous  gallbladder by Dr. Lovell Sheehan on Friday.  His condition continues to improve.      Angus G. Renard Matter, MD  Electronically Signed     AGM/MEDQ  D:  03/07/2005  T:  03/07/2005  Job:  [id number]

## 2010-07-09 NOTE — Op Note (Signed)
[name], [name] NO.:  [id number]   MEDICAL RECORD NO.:  [id number]          PATIENT TYPE:  INP   LOCATION:  5013                         FACILITY:  [facility]   PHYSICIAN:  Sharolyn Douglas, M.D.        DATE OF BIRTH:  [birth year]   DATE OF PROCEDURE:  03/29/2006  DATE OF DISCHARGE:                               OPERATIVE REPORT   DIAGNOSIS:  1. Severe lumbar spinal stenosis.  2. Lumbar spondylosis.  3. Post laminectomy syndrome.  4. Thoracolumbar kyphosis.   PROCEDURE:  1. Revision lumbar laminectomy L2-L3, L3-L4, L4-L5 and L5-S1 with wide      decompression of the thecal sac and nerve roots bilaterally.  2. Posterior spinal fusion L2 through L5  3. Segmental pedicle screw instrumentation L2 through L5 using the      Abbott spine system.  4. Local autogenous bone graft supplemented with bone morphogenic      protein.   SURGEON:  Sharolyn Douglas, M.D.   ASSISTANT:  Verlin Fester, P.A.   ANESTHESIA:  General endotracheal.   ESTIMATED BLOOD LOSS:  1100 mL, 800 returned with Cell Saver.   COMPLICATIONS:  None.   COUNTS:  Needle and sponge count correct.   INDICATIONS:  The patient is a 70 year old male with progressive back  and bilateral lower extremity pain.  He is unable to stand up straight.  He has symptoms of neurogenic claudication.  He has had two previous  lumbar laminectomies. Imaging studies show severe persistent spinal  stenosis and thoracolumbar kyphosis.  He now presents for revision  decompression and fusion.  He understands that we will not be able to  correct his thoracolumbar kyphosis with this procedure and he will  continue to have some symptoms related to that and multilevel  spondylosis.  The risks, benefits, and alternatives were reviewed.   DESCRIPTION OF PROCEDURE:  After informed consent, he was taken to the  operating room.  He underwent general endotracheal anesthesia without  difficulty.  He was given prophylactic IV antibiotics.   He was carefully  turned prone onto the AcroMed four poster positioning frame.  All bony  prominences were padded.  His face and eyes were protected at all times.  His back was prepped and draped in the usual sterile fashion.  Neural  monitoring was established in the form of EMGs and lower extremity  SSEPs.  The previous incision was utilized and extended several cm  proximally.  Dissection was carried through the dense scar tissue.  The  previous laminotomy was identified which was very small.  The previous  surgeries had removed the spinous processes of L3, L4 and L5.  We  identified the spinous process of L1 and L2.  A subperiosteal exposure  was carried out and this was quite difficult due to the patient's large  body habitus and also the scar tissue.  Deep retractors were placed.  Interoperative x-ray was taken to confirm the levels.   We then turned our attention to performing revision laminectomy.  The  previous laminectomy was quite small, measuring only 1-2  mm in the  midline.  Once all the scar tissue was debrided, a high speed bur was  used to thin the lamina.  We removed the spinous process and lamina of  L2.  We then began the tedious process of removing the ligamentum flavum  and scar tissue.  There were two areas that were quite thin and we  encountered small dural rents, one was at the level of L3-L4 on the left  side and the other was at L2-L3 in the midline.  Both of these were  repaired tightly with 4-0 Nurolon suture.  Valsalva showed no leakage  after the repair.  The decompression was continued out laterally.  Again, there was extensive scar tissue and severe stenosis noted within  the lateral recesses, especially on the right side at L3-L4 and L4-L5.  Each nerve root was identified and decompressed at L2-L3, L3-L4, L4-L5  and L5-S1.  Hemostasis was achieved with FloSeal and Gelfoam.   Once we were satisfied, we placed pedicle screws at L2, L3, L4, and L5  using  anatomic probing technique.  Each pedicle was palpated, both  within the spinal canal and within the pedicle itself, and there were no  breeches.  We then placed the pedicle screws after tapping and measuring  for appropriate lengths.  We utilized 5.5 mm screws at L2 because the  pedicles were quite small.  At L3, L4 and L5, we utilized 6.5 mm screws.  Each screw was then stimulated using triggered EMGs.  In each case, the  reading was greater than 12 mA except for the right L5 screw which read  6 mA.  We, therefore, carefully evaluated the pedicle from within the  spinal canal.  We could clearly see the L5 nerve root as it exited under  the L5 pedicle and we did not see any impingement on the screw.  We,  therefore, elected to leave this screw in as it was at the foundation of  the construct and the fixation was good.   We decorticated the transverse processes of L2, L3, L4, and L5  bilaterally and then tightly packed in the local bone graft which had  been obtained from the laminectomy, cleaned and morselized.  We also  laid in the lateral gutters a medium BMP infuse kit in order to avoid  taking iliac crest bone graft.  We placed 125 mm titanium rods into the  polyaxial screw heads which had been bent into lordosis.  The  appropriate locking caps were placed and tightened.  Two cross  connectors were placed.  A deep Hemovac drain was then placed.  Hemostasis was achieved.  We left Tisseel fibrin glue over the small  dural repairs.  The deep fascia was closed with a running #1 Vicryl  suture.  The subcutaneous layer was closed with 0 Vicryl and 2-0 Vicryl  followed by a running 3-0 nylon suture on the skin edges.  A sterile  dressing was applied.  The patient was turned supine, extubated without  difficulty, and transferred to recovery in stable condition.  It should be noted that my assistant [facility] was present  throughout the procedure including the positioning, the  exposure, the  decompression, the instrumentation, and she also assisted with wound  closure.      Sharolyn Douglas, M.D.  Electronically Signed     MC/MEDQ  D:  03/29/2006  T:  03/29/2006  Job:  [id number]

## 2010-07-23 ENCOUNTER — Ambulatory Visit (INDEPENDENT_AMBULATORY_CARE_PROVIDER_SITE_OTHER): Payer: Medicare Other | Admitting: Urology

## 2010-07-23 DIAGNOSIS — N320 Bladder-neck obstruction: Secondary | ICD-10-CM

## 2010-07-23 DIAGNOSIS — N453 Epididymo-orchitis: Secondary | ICD-10-CM

## 2010-07-23 DIAGNOSIS — R3989 Other symptoms and signs involving the genitourinary system: Secondary | ICD-10-CM

## 2010-07-23 DIAGNOSIS — Z8546 Personal history of malignant neoplasm of prostate: Secondary | ICD-10-CM

## 2010-11-16 ENCOUNTER — Encounter (HOSPITAL_COMMUNITY): Payer: Self-pay

## 2010-11-16 ENCOUNTER — Other Ambulatory Visit: Payer: Self-pay

## 2010-11-16 ENCOUNTER — Encounter (HOSPITAL_COMMUNITY)
Admission: RE | Admit: 2010-11-16 | Discharge: 2010-11-16 | Disposition: A | Discharge status: Home / Self Care | Payer: Medicare Other | Source: Ambulatory Visit | Attending: Ophthalmology | Admitting: Ophthalmology

## 2010-11-16 HISTORY — DX: Unspecified osteoarthritis, unspecified site: M19.90

## 2010-11-16 HISTORY — DX: Anesthesia of skin: R20.0

## 2010-11-16 LAB — BASIC METABOLIC PANEL
BUN: 14 mg/dL (ref 6–23)
CO2: 34 mEq/L — ABNORMAL HIGH (ref 19–32)
Chloride: 98 mEq/L (ref 96–112)
GFR calc non Af Amer: >60 mL/min (ref >60)
Glucose, Bld: 107 mg/dL — ABNORMAL HIGH (ref 70–99)
Potassium: 3.8 mEq/L (ref 3.5–5.1)

## 2010-11-16 LAB — CBC
HCT: 46 % (ref 39.0–52.0)
Hemoglobin: 15.7 g/dL (ref 13.0–17.0)
MCHC: 34.1 g/dL (ref 30.0–36.0)

## 2010-11-16 MED ORDER — CYCLOPENTOLATE-PHENYLEPHRINE 0.2-1 % OP SOLN
OPHTHALMIC | Status: AC
  Filled 2010-11-16: qty 2

## 2010-11-16 NOTE — Patient Instructions (Signed)
19 [name]  11/16/2010   Your procedure is scheduled on:  11/22/10  Report to [facility] at 06:15 AM.  Call this number if you have problems the morning of surgery: [phone]   Remember:   Do not eat food:After Midnight.  Do not drink clear liquids: After Midnight.  Take these medicines the morning of surgery with A SIP OF WATER: none   Do not wear jewelry, make-up or nail polish.  Do not wear lotions, powders, or perfumes. You may wear deodorant.  Do not shave 48 hours prior to surgery.  Do not bring valuables to the hospital.  Contacts, dentures or bridgework may not be worn into surgery.  Leave suitcase in the car. After surgery it may be brought to your room.  For patients admitted to the hospital, checkout time is 11:00 AM the day of discharge.   Patients discharged the day of surgery will not be allowed to drive home.  Name and phone number of your driver: wife  Special Instructions: N/A   Please read over the following fact sheets that you were given: Anesthesia Post-op Instructions and Care and Recovery After Surgery Monitored Anesthesia Care (MAC)  MAC stands for monitored anesthesia care. MAC usually means a tube is not put in your trachea (windpipe). MAC may also be called moderate sedation. MAC usually involves giving intravenous anesthetic drugs, oxygen, watching vital signs and standard patient monitoring procedures similar to those used during a general anesthetic. MAC can be done without going to the operating room. MAC is typically used for small procedures that cannot be done with only local anesthesia. MAC usually means lower doses of anesthetic drugs. The recovery period tends to be shorter. The drugs used cause a lower level of awareness. This means you are partially awake and your reflexes are intact. You may hear what is being said and feel some pressure, but should not feel pain. The drugs used may affect your ability to remember the procedure. If you have  depressed consciousness and lose some protective reflexes, this is called deep sedation. If you become unconscious and fall completely asleep, this is general anesthesia. In both deep sedation and general anesthesia, the caregivers must make sure that your airway remains open. During MAC, the sedation-trained caregivers will:  Give medications which may include:   Sedatives.   Analgesics.   Hypnotics.   Other medications which are needed to keep you comfortable, safe and secure.   Give local anesthetic to numb the procedural site.   Monitor your level of consciousness.   Monitor your blood pressure.   Monitor your heart rate and rhythm.   Monitor your respirations and oxygen levels.   Monitor your airway.   Monitor your level of pain.   Evaluate and treat problems which may occur.  Some of this information is from the [employer]. Document Released: 11/03/2004 Document Re-Released: 03/01/2009 [facility] Patient Information 2011 [place], [place].

## 2010-11-22 ENCOUNTER — Encounter (HOSPITAL_COMMUNITY): Payer: Self-pay | Admitting: *Deleted

## 2010-11-22 ENCOUNTER — Encounter (HOSPITAL_COMMUNITY)
Admission: RE | Disposition: A | Discharge status: Home / Self Care | Payer: Self-pay | Source: Ambulatory Visit | Attending: Ophthalmology

## 2010-11-22 ENCOUNTER — Encounter (HOSPITAL_COMMUNITY): Payer: Self-pay | Admitting: Anesthesiology

## 2010-11-22 ENCOUNTER — Ambulatory Visit (HOSPITAL_COMMUNITY)
Admission: RE | Admit: 2010-11-22 | Discharge: 2010-11-22 | Disposition: A | Discharge status: Home / Self Care | Payer: Medicare Other | Source: Ambulatory Visit | Attending: Ophthalmology | Admitting: Ophthalmology

## 2010-11-22 ENCOUNTER — Ambulatory Visit (HOSPITAL_COMMUNITY): Payer: Medicare Other | Admitting: Anesthesiology

## 2010-11-22 DIAGNOSIS — Z01812 Encounter for preprocedural laboratory examination: Secondary | ICD-10-CM | POA: Insufficient documentation

## 2010-11-22 DIAGNOSIS — Z01810 Encounter for preprocedural cardiovascular examination: Secondary | ICD-10-CM | POA: Insufficient documentation

## 2010-11-22 DIAGNOSIS — H2510 Age-related nuclear cataract, unspecified eye: Secondary | ICD-10-CM | POA: Insufficient documentation

## 2010-11-22 HISTORY — PX: CATARACT EXTRACTION W/PHACO: SHX586

## 2010-11-22 SURGERY — PHACOEMULSIFICATION, CATARACT, WITH IOL INSERTION
Anesthesia: Monitor Anesthesia Care | Site: Eye | Laterality: Right | Wound class: Clean

## 2010-11-22 MED ORDER — GATIFLOXACIN 0.5 % OP SOLN
1.0000 [drp] | Freq: Once | OPHTHALMIC | Status: AC
  Administered 2010-11-22: 1 [drp] via OPHTHALMIC

## 2010-11-22 MED ORDER — TETRACAINE HCL 0.5 % OP SOLN: 1.0000 [drp] | OPHTHALMIC | Status: AC

## 2010-11-22 MED ORDER — NA HYALUR & NA CHOND-NA HYALUR 0.55-0.5 ML IO KIT
PACK | INTRAOCULAR | Status: DC | PRN
  Administered 2010-11-22: 1 via OPHTHALMIC

## 2010-11-22 MED ORDER — TETRACAINE HCL 0.5 % OP SOLN
OPHTHALMIC | Status: AC
  Filled 2010-11-22: qty 2

## 2010-11-22 MED ORDER — MIDAZOLAM HCL 2 MG/2ML IJ SOLN
1.0000 mg | INTRAMUSCULAR | Status: DC | PRN
  Administered 2010-11-22: 2 mg via INTRAVENOUS

## 2010-11-22 MED ORDER — KETOROLAC TROMETHAMINE 0.5 % OP SOLN
1.0000 [drp] | OPHTHALMIC | Status: AC
  Administered 2010-11-22: 1 [drp] via OPHTHALMIC

## 2010-11-22 MED ORDER — LIDOCAINE 3.5 % OP GEL OPTIME - NO CHARGE
OPHTHALMIC | Status: DC | PRN
  Administered 2010-11-22: 1 [drp] via OPHTHALMIC

## 2010-11-22 MED ORDER — BSS IO SOLN
INTRAOCULAR | Status: DC | PRN
  Administered 2010-11-22: 15 mL via OPHTHALMIC

## 2010-11-22 MED ORDER — TETRACAINE 0.5 % OP SOLN OPTIME - NO CHARGE
OPHTHALMIC | Status: DC | PRN
  Administered 2010-11-22: 1 [drp] via OPHTHALMIC

## 2010-11-22 MED ORDER — KETOROLAC TROMETHAMINE 0.5 % OP SOLN: 1.0000 [drp] | Freq: Four times a day (QID) | OPHTHALMIC | Status: AC

## 2010-11-22 MED ORDER — LACTATED RINGERS IV SOLN
INTRAVENOUS | Status: DC | PRN
  Administered 2010-11-22: 07:00:00 via INTRAVENOUS

## 2010-11-22 MED ORDER — EPINEPHRINE HCL 1 MG/ML IJ SOLN
INTRAMUSCULAR | Status: AC
  Filled 2010-11-22: qty 1

## 2010-11-22 MED ORDER — LIDOCAINE HCL 3.5 % OP GEL: 1.0000 "application " | Freq: Once | OPHTHALMIC | Status: DC

## 2010-11-22 MED ORDER — CYCLOPENTOLATE-PHENYLEPHRINE 0.2-1 % OP SOLN
1.0000 [drp] | OPHTHALMIC | Status: AC
  Administered 2010-11-22: 1 [drp] via OPHTHALMIC

## 2010-11-22 MED ORDER — LIDOCAINE HCL 3.5 % OP GEL
OPHTHALMIC | Status: AC
  Filled 2010-11-22: qty 5

## 2010-11-22 MED ORDER — MIDAZOLAM HCL 2 MG/2ML IJ SOLN
INTRAMUSCULAR | Status: AC
  Filled 2010-11-22: qty 2

## 2010-11-22 MED ORDER — KETOROLAC TROMETHAMINE 0.5 % OP SOLN
1.0000 [drp] | Freq: Once | OPHTHALMIC | Status: AC
  Administered 2010-11-22: 1 [drp] via OPHTHALMIC

## 2010-11-22 MED ORDER — GATIFLOXACIN 0.5 % OP SOLN OPTIME - NO CHARGE
OPHTHALMIC | Status: DC | PRN
  Administered 2010-11-22: 1 [drp] via OPHTHALMIC

## 2010-11-22 MED ORDER — EPINEPHRINE HCL 1 MG/ML IJ SOLN
INTRAOCULAR | Status: DC | PRN
  Administered 2010-11-22: 08:00:00

## 2010-11-22 SURGICAL SUPPLY — 27 items
CAPSULAR TENSION RING-AMO (OPHTHALMIC RELATED) IMPLANT
CLOTH BEACON ORANGE TIMEOUT ST (SAFETY) ×1 IMPLANT
GLOVE BIO SURGEON STRL SZ7.5 (GLOVE) IMPLANT
GLOVE BIOGEL M 6.5 STRL (GLOVE) IMPLANT
GLOVE BIOGEL PI IND STRL 6.5 (GLOVE) IMPLANT
GLOVE BIOGEL PI IND STRL 7.0 (GLOVE) IMPLANT
GLOVE BIOGEL PI INDICATOR 6.5 (GLOVE) ×1
GLOVE BIOGEL PI INDICATOR 7.0 (GLOVE)
GLOVE ECLIPSE 6.5 STRL STRAW (GLOVE) IMPLANT
GLOVE ECLIPSE 7.5 STRL STRAW (GLOVE) IMPLANT
GLOVE EXAM NITRILE LRG STRL (GLOVE) IMPLANT
GLOVE EXAM NITRILE MD LF STRL (GLOVE) ×1 IMPLANT
GLOVE SKINSENSE NS SZ6.5 (GLOVE)
GLOVE SKINSENSE NS SZ7.0 (GLOVE)
GLOVE SKINSENSE STRL SZ6.5 (GLOVE) IMPLANT
GLOVE SKINSENSE STRL SZ7.0 (GLOVE) IMPLANT
INST SET CATARACT ~~LOC~~ (KITS) ×2 IMPLANT
KIT VITRECTOMY (OPHTHALMIC RELATED) IMPLANT
PAD ARMBOARD 7.5X6 YLW CONV (MISCELLANEOUS) ×1 IMPLANT
PROC W NO LENS (INTRAOCULAR LENS)
PROC W SPEC LENS (INTRAOCULAR LENS)
PROCESS W NO LENS (INTRAOCULAR LENS) IMPLANT
PROCESS W SPEC LENS (INTRAOCULAR LENS) IMPLANT
RING MALYGIN (MISCELLANEOUS) IMPLANT
SIGHTPATH CAT PROC W REG LENS (Ophthalmic Related) ×2 IMPLANT
VISCOELASTIC ADDITIONAL (OPHTHALMIC RELATED) IMPLANT
WATER STERILE IRR 250ML POUR (IV SOLUTION) ×1 IMPLANT

## 2010-11-22 NOTE — Anesthesia Preprocedure Evaluation (Addendum)
Anesthesia Evaluation  Name, MR# and DOB Patient awake  General Assessment Comment  Reviewed: Allergy & Precautions, H&P , NPO status , Patient's Chart, lab work & pertinent test results  History of Anesthesia Complications Negative for: history of anesthetic complications  Airway Mallampati: II      Dental  (+) Teeth Intact   Pulmonary  clear to auscultation        Cardiovascular Regular Normal    Neuro/Psych    GI/Hepatic   Endo/Other    Renal/GU      Musculoskeletal   Abdominal   Peds  Hematology   Anesthesia Other Findings   Reproductive/Obstetrics                           Anesthesia Physical Anesthesia Plan  ASA: II  Anesthesia Plan: MAC   Post-op Pain Management:    Induction: Intravenous  Airway Management Planned: Nasal Cannula  Additional Equipment:   Intra-op Plan:   Post-operative Plan: Extubation in OR  Informed Consent: I have reviewed the patients History and Physical, chart, labs and discussed the procedure including the risks, benefits and alternatives for the proposed anesthesia with the patient or authorized representative who has indicated his/her understanding and acceptance.     Plan Discussed with:   Anesthesia Plan Comments:         Anesthesia Quick Evaluation

## 2010-11-22 NOTE — H&P (Signed)
There is no change in patient status since original H&P

## 2010-11-22 NOTE — Brief Op Note (Signed)
        11/22/2010  8:35 AM  PATIENT:  [name]  70 y.o. male  PRE-OPERATIVE DIAGNOSIS:  nuclear cataract right eye  POST-OPERATIVE DIAGNOSIS:  nuclear cataract right eye  PROCEDURE:  Procedure(s): CATARACT EXTRACTION PHACO AND INTRAOCULAR LENS PLACEMENT (IOC)  SURGEON:  Surgeon(s): Susa Simmonds  ASSISTANTS: none   ANESTHESIA STAFF: Despina Hidden - CRNA Laurene Footman - Anesthesiologist  ANESTHESIA:   topical/mac  REQUESTED LENS POWER: 18.50  LENS IMPLANT INFORMATION: @ORIMPLANT @  CUMULATIVE DISSIPATED ENERGY:see nurses notes  INDICATIONS:decreased vision interfering with driving and reading  OP GNFAOZHY:8+ NS  COMPLICATIONS:None  DICTATION #: see scanned operative note completed today  PLAN OF CARE: per printed information from office  PATIENT DISPOSITION:  Short Stay

## 2010-11-22 NOTE — Transfer of Care (Signed)
Immediate Anesthesia Transfer of Care Note  Patient: [name]  Procedure(s) Performed:  CATARACT EXTRACTION PHACO AND INTRAOCULAR LENS PLACEMENT (IOC) - CDE: 6.81  Patient Location: PACU and Short Stay  Anesthesia Type: MAC  Level of Consciousness: awake, alert , oriented and patient cooperative  Airway & Oxygen Therapy: Patient Spontanous Breathing  Post-op Assessment: Report given to PACU RN, Post -op Vital signs reviewed and stable and Patient moving all extremities X 4  Post vital signs: Reviewed and stable  Complications: No apparent anesthesia complications

## 2010-11-22 NOTE — Anesthesia Postprocedure Evaluation (Signed)
  Anesthesia Post-op Note  Patient: [name]  Procedure(s) Performed:  CATARACT EXTRACTION PHACO AND INTRAOCULAR LENS PLACEMENT (IOC) - CDE: 6.81  Patient Location: PACU and Short Stay  Anesthesia Type: MAC  Level of Consciousness: awake, alert , oriented and patient cooperative  Airway and Oxygen Therapy: Patient Spontanous Breathing  Post-op Pain: none  Post-op Assessment: Post-op Vital signs reviewed, Patient's Cardiovascular Status Stable, Respiratory Function Stable and No signs of Nausea or vomiting  Post-op Vital Signs: Reviewed and stable  Complications: No apparent anesthesia complications

## 2010-11-22 NOTE — Op Note (Signed)
See scanned op note 

## 2010-11-25 ENCOUNTER — Encounter (HOSPITAL_COMMUNITY): Payer: Self-pay | Admitting: Ophthalmology

## 2010-11-26 ENCOUNTER — Ambulatory Visit (INDEPENDENT_AMBULATORY_CARE_PROVIDER_SITE_OTHER): Payer: Medicare Other | Admitting: Urology

## 2010-11-26 DIAGNOSIS — N320 Bladder-neck obstruction: Secondary | ICD-10-CM

## 2010-11-26 DIAGNOSIS — Z8546 Personal history of malignant neoplasm of prostate: Secondary | ICD-10-CM

## 2011-01-17 ENCOUNTER — Telehealth: Payer: Self-pay | Admitting: Internal Medicine

## 2011-01-17 NOTE — Telephone Encounter (Signed)
Pt called to set up tcs with RMR per his PCP. I told him that it was in computer for him to have his next tcs in 2016, but he said that PCP thought he should have it done now. 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 Pt said he would just wait.

## 2011-01-17 NOTE — Telephone Encounter (Signed)
Pt has appt on 01/19/11

## 2011-01-17 NOTE — Telephone Encounter (Signed)
Next tcs in 2015

## 2011-01-19 ENCOUNTER — Ambulatory Visit (INDEPENDENT_AMBULATORY_CARE_PROVIDER_SITE_OTHER): Payer: Medicare Other | Admitting: Gastroenterology

## 2011-01-19 ENCOUNTER — Encounter: Payer: Self-pay | Admitting: Gastroenterology

## 2011-01-19 DIAGNOSIS — Z860101 Personal history of adenomatous and serrated colon polyps: Secondary | ICD-10-CM

## 2011-01-19 DIAGNOSIS — R1013 Epigastric pain: Secondary | ICD-10-CM | POA: Insufficient documentation

## 2011-01-19 DIAGNOSIS — R198 Other specified symptoms and signs involving the digestive system and abdomen: Secondary | ICD-10-CM | POA: Insufficient documentation

## 2011-01-19 DIAGNOSIS — Z86010 Personal history of colonic polyps: Secondary | ICD-10-CM

## 2011-01-19 DIAGNOSIS — K625 Hemorrhage of anus and rectum: Secondary | ICD-10-CM | POA: Insufficient documentation

## 2011-01-19 DIAGNOSIS — K5900 Constipation, unspecified: Secondary | ICD-10-CM | POA: Insufficient documentation

## 2011-01-19 DIAGNOSIS — K581 Irritable bowel syndrome with constipation: Secondary | ICD-10-CM | POA: Insufficient documentation

## 2011-01-19 DIAGNOSIS — D696 Thrombocytopenia, unspecified: Secondary | ICD-10-CM

## 2011-01-19 LAB — CBC WITH DIFFERENTIAL/PLATELET
Eosinophils Absolute: 0.1 10*3/uL (ref 0.0–0.7)
HCT: 47.7 % (ref 39.0–52.0)
Hemoglobin: 16.1 g/dL (ref 13.0–17.0)
Lymphs Abs: 1 10*3/uL (ref 0.7–4.0)
MCH: 31.3 pg (ref 26.0–34.0)
MCHC: 33.8 g/dL (ref 30.0–36.0)
Monocytes Absolute: 0.4 10*3/uL (ref 0.1–1.0)
Monocytes Relative: 11 % (ref 3–12)
Neutro Abs: 2.4 10*3/uL (ref 1.7–7.7)
Neutrophils Relative %: 61 % (ref 43–77)
RBC: 5.14 MIL/uL (ref 4.22–5.81)

## 2011-01-19 MED ORDER — POLYETHYLENE GLYCOL 3350 17 GM/SCOOP PO POWD: ORAL | Status: DC

## 2011-01-19 NOTE — Assessment & Plan Note (Signed)
History of adenomatous colonic polyps. Last colonoscopy incomplete requiring air-contrast barium enema to complement it. Due to other GI issues, he will be undergoing surveillance colonoscopy as well.

## 2011-01-19 NOTE — Patient Instructions (Addendum)
We have scheduled you for a colonoscopy and upper endoscopy. Please see separate instructions. Please have your blood work done today. I recommend you take MiraLax 17 g every evening on days that you do not have a bowel movement.  High Fiber Diet A high fiber diet changes your normal diet to include more whole grains, legumes, fruits, and vegetables. Changes in the diet involve replacing refined carbohydrates with unrefined foods. The calorie level of the diet is essentially unchanged. The Dietary Reference Intake (recommended amount) for adult males is 38 g per day. For adult females, it is 25 g per day. Pregnant and lactating women should consume 28 g of fiber per day. Fiber is the intact part of a plant that is not broken down during digestion. Functional fiber is fiber that has been isolated from the plant to provide a beneficial effect in the body. PURPOSE  Increase stool bulk.   Ease and regulate bowel movements.   Lower cholesterol.  INDICATIONS THAT YOU NEED MORE FIBER  Constipation and hemorrhoids.   Uncomplicated diverticulosis (intestine condition) and irritable bowel syndrome.   Weight management.   As a protective measure against hardening of the arteries (atherosclerosis), diabetes, and cancer.  NOTE OF CAUTION If you have a digestive or bowel problem, ask your caregiver for advice before adding high fiber foods to your diet. Some of the following medical problems are such that a high fiber diet should not be used without consulting your caregiver:  Acute diverticulitis (intestine infection).   Partial small bowel obstructions.   Complicated diverticular disease involving bleeding, rupture (perforation), or abscess (boil, furuncle).   Presence of autonomic neuropathy (nerve damage) or gastric paresis (stomach cannot empty itself).  GUIDELINES FOR INCREASING FIBER  Start adding fiber to the diet slowly. A gradual increase of about 5 more grams (2 slices of whole-wheat  bread, 2 servings of most fruits or vegetables, or 1 bowl of high fiber cereal) per day is best. Too rapid an increase in fiber may result in constipation, flatulence, and bloating.   Drink enough water and fluids to keep your urine clear or pale yellow. Water, juice, or caffeine-free drinks are recommended. Not drinking enough fluid may cause constipation.   Eat a variety of high fiber foods rather than one type of fiber.   Try to increase your intake of fiber through using high fiber foods rather than fiber pills or supplements that contain small amounts of fiber.   The goal is to change the types of food eaten. Do not supplement your present diet with high fiber foods, but replace foods in your present diet.  INCLUDE A VARIETY OF FIBER SOURCES  Replace refined and processed grains with whole grains, canned fruits with fresh fruits, and incorporate other fiber sources. White rice, white breads, and most bakery goods contain little or no fiber.   Brown whole-grain rice, buckwheat oats, and many fruits and vegetables are all good sources of fiber. These include: broccoli, Brussels sprouts, cabbage, cauliflower, beets, sweet potatoes, white potatoes (skin on), carrots, tomatoes, eggplant, squash, berries, fresh fruits, and dried fruits.   Cereals appear to be the richest source of fiber. Cereal fiber is found in whole grains and bran. Bran is the fiber-rich outer coat of cereal grain, which is largely removed in refining. In whole-grain cereals, the bran remains. In breakfast cereals, the largest amount of fiber is found in those with "bran" in their names. The fiber content is sometimes indicated on the label.   You may  need to include additional fruits and vegetables each day.   In baking, for 1 cup white flour, you may use the following substitutions:   1 cup whole-wheat flour minus 2 tbs.    cup white flour plus  cup whole-wheat flour.  Document Released: 02/07/2005 Document Revised:  10/20/2010 Document Reviewed: 12/16/2008 [facility] Patient Information 2012 [place], [place].

## 2011-01-19 NOTE — Assessment & Plan Note (Signed)
Stabbing intermittent epigastric pain not necessarily related to meals. Also complains of bloating/trapped gas. Offered upper GI tract evaluation via endoscopy.  I have discussed the risks, alternatives, benefits with regards to but not limited to the risk of reaction to medication, bleeding, infection, perforation and the patient is agreeable to proceed. Written consent to be obtained.

## 2011-01-19 NOTE — Assessment & Plan Note (Signed)
Repeat CBC. If persistent thrombocytopenia, he will need further evaluation.

## 2011-01-19 NOTE — Progress Notes (Signed)
Primary Care Physician:  Alice Reichert, MD  Primary Gastroenterologist:  Roetta Sessions, MD   Chief Complaint  Patient presents with  . Colonoscopy    on recall list  . Abdominal Pain  . Constipation    bleed when he goes    HPI:  [name] is a 70 y.o. male here requesting a colonoscopy and upper endoscopy. His last colonoscopy and EGD were done in 2009. Please see below for details. He does have a history of adenomatous colonic polyps, erosive esophagitis. He complains of intermittent stabbing pain in the epigastrium which does not occur necessarily with meals. It seems to be random. Lasts for a few minutes at a time. Nothing seems to make it better or worse. Denies vomiting. He does complain of bloating. Feels like he is full of gas but cannot pass it. Interestingly, his last colonoscopy was incomplete due to the redundancy and looping. Followup barium enema showed interposition of the colon between the liver and diaphragm. He had no issues with completing colonoscopies previously/prior to his gallbladder surgery. He wonders if he's developed some scar tissue which has added to these issues. In 2007, he had a gangrenous gallbladder, choledocholithiasis and required ERCP with sphincterotomy and stone extraction, cholecystectomy. He developed an abscess requiring percutaneous drainage. He states he almost died.  Patient complains of recent onset constipation which began about 4 weeks ago. He has tried MiraLax with some relief on a when necessary basis. He is passing blood with bowel movements. The blood is fresh in appearance. He denies any rectal pain. He notes that he had a remote hemorrhoidectomy with Dr. Dickey Gave years ago. He states the stitch came loose and he bled significantly requiring transfusion. He would like to be checked for hemorrhoids again. With regards to his constipation, he denies any recent change in his diet or medications. Denies weight loss. Denies heartburn, dysphagia,  melena. Denies significant NSAID or aspirin use. Occasionally takes Celebrex for his knees.  Current Outpatient Prescriptions  Medication Sig Dispense Refill  . celecoxib (CELEBREX) 200 MG capsule Take 200 mg by mouth 2 (two) times daily. This was a sample that was provided by the dr.        Allergies as of 01/19/2011  . (No Known Allergies)    Past Medical History  Diagnosis Date  . Cancer   . Prostate ca     seed implants  . Arthritis   . Numbness     left leg after back surgery  . Chronic back pain   . GERD (gastroesophageal reflux disease)   . Hx of adenomatous colonic polyps   . Schatzki's ring     History of  . Helicobacter pylori gastritis     Treated  . IBS (irritable bowel syndrome)     Past Surgical History  Procedure Date  . Appendectomy age 28  . Back surgery 02/08    [facility]  . Back surgery 09/24/04    lumbar, [facility]  . Back surgery 09/18/02    [facility]  . Cholecystectomy 03/04/05    [facility], [facility]. Gangrenous cholecystitis complicated by abscess requiring percutaneous drainage. He also had common duct stones requiring ERCP and sphincterotomy.  . Ercp 03/02/05    [facility], [facility]. Normal-appearing biliary tree (gallbladder not image), status post sphincterotomy with recovery of small pieces of stone material, status post balloon occlusion cholangiogram.  . Cataract extraction w/phaco 11/22/2010    Procedure: CATARACT EXTRACTION PHACO AND INTRAOCULAR LENS PLACEMENT (IOC);  Surgeon: Susa Simmonds;  Location:  [facility];  Service: Ophthalmology;  Laterality: Right;  CDE: 6.81  . Esophagogastroduodenoscopy August 2005    Erosive esophagitis and Schatzki ring, small hiatal hernia  . Colonoscopy August 2005    Scattered sigmoid diverticulosis, splenic flexure polyp. Hyperplastic  . Colonoscopy 1997    3 cm tubular adenoma and a sending colon  . Incomplete colonoscopy December 2009    Left-sided diverticula, mid descending colon polyp, due to recurrent looping and redundancy exam  was incomplete. It was felt that the mid colon was reached. Followup barium enema showed colon interposition between the liver and the diaphragm, redundant sigmoid colon but no colon mass or polyp identified.. Pathology revealed tubular adenoma.  . Esophagogastroduodenoscopy 01/2008    Mild reflux esophagitis, small hiatal hernia    Family History  Problem Relation Age of Onset  . Hypotension Neg Hx   . Anesthesia problems Neg Hx   . Malignant hyperthermia Neg Hx   . Pseudochol deficiency Neg Hx   . Diabetes Mother   . Coronary artery disease Father   . Coronary artery disease Mother   . Diabetes Father     History   Social History  . Marital Status: Married    Spouse Name: N/A    Number of Children: 3  . Years of Education: N/A   Occupational History  . Retired IT sales professional    Social History Main Topics  . Smoking status: Never Smoker   . Smokeless tobacco: Not on file  . Alcohol Use: No  . Drug Use: No  . Sexually Active:    Other Topics Concern  . Not on file   Social History Narrative  . No narrative on file      ROS:  General: Negative for anorexia, weight loss, fever, chills, fatigue, weakness. Eyes: Negative for vision changes.  ENT: Negative for hoarseness, difficulty swallowing , nasal congestion. CV: Negative for chest pain, angina, palpitations, dyspnea on exertion, peripheral edema.  Respiratory: Negative for dyspnea at rest, dyspnea on exertion, cough, sputum, wheezing.  GI: See history of present illness. GU:  Negative for dysuria, hematuria, urinary incontinence, urinary frequency, nocturnal urination.  MS: Negative for joint pain, low back pain.  Derm: Negative for rash or itching.  Neuro: Negative for weakness, abnormal sensation, seizure, frequent headaches, memory loss, confusion.  Psych: Negative for anxiety, depression, suicidal ideation, hallucinations.  Endo: Negative for unusual weight change.  Heme: Negative for bruising or  bleeding. Allergy: Negative for rash or hives.    Physical Examination:  BP 149/78  Pulse 88  Temp 97.6 F (36.4 C)  Ht 6\' 1"  (1.854 m)  Wt 255 lb 9.6 oz (115.939 kg)  BMI 33.72 kg/m2   General: Well-nourished, well-developed in no acute distress.  Head: Normocephalic, atraumatic.   Eyes: Conjunctiva pink, no icterus. Mouth: Oropharyngeal mucosa moist and pink , no lesions erythema or exudate. Neck: Supple without thyromegaly, masses, or lymphadenopathy.  Lungs: Clear to auscultation bilaterally.  Heart: Regular rate and rhythm, no murmurs rubs or gallops.  Abdomen: Bowel sounds are normal, nontender, nondistended, no hepatosplenomegaly or masses, no abdominal bruits or    hernia , no rebound or guarding.   Rectal: Deferred to time of colonoscopy. Extremities: No lower extremity edema. No clubbing or deformities.  Neuro: Alert and oriented x 4 , grossly normal neurologically.  Skin: Warm and dry, no rash or jaundice.   Psych: Alert and cooperative, normal mood and affect.

## 2011-01-19 NOTE — Assessment & Plan Note (Signed)
Recent onset of constipation. Baseline bowel movement typically once daily. Nothing dietary or medication wise to explain change. We'll check thyroid status. Plan for colonoscopy in light of change in bowels, significant rectal bleeding. Notably patient had an incomplete colonoscopy last time. Consider use of pediatric scope if needed. Patient states he had adequate conscious sedation last time even with difficulty in the procedure. I have discussed the risks, alternatives, benefits with regards to but not limited to the risk of reaction to medication, bleeding, infection, perforation and the patient is agreeable to proceed. Written consent to be obtained.  Would advise she take MiraLax 17 g every evening that he does not have a bowel movement. Increase dietary fiber intake. Handout provided.

## 2011-01-20 NOTE — Progress Notes (Signed)
Quick Note:  Please let patient know that his thyroid checked out okay. Overall his CBC looks fine, platelet count and white blood cell count very slightly low. Would recheck CBC in 2 months. Proceed with procedures as scheduled. ______

## 2011-01-24 ENCOUNTER — Encounter (HOSPITAL_COMMUNITY): Payer: Self-pay | Admitting: Pharmacy Technician

## 2011-01-26 NOTE — Progress Notes (Signed)
Quick Note:  Tried to call pt- NA ______ 

## 2011-01-31 MED ORDER — SODIUM CHLORIDE 0.45 % IV SOLN
Freq: Once | INTRAVENOUS | Status: AC
  Administered 2011-02-01: 1000 mL via INTRAVENOUS

## 2011-01-31 NOTE — Progress Notes (Signed)
Quick Note:  Pt aware, lab order on file.  Pt is scheduled for procedure tomorrow. ______

## 2011-01-31 NOTE — Progress Notes (Signed)
Quick Note:  Tried to call pt- LM with his wife to return call ______

## 2011-02-01 ENCOUNTER — Ambulatory Visit (HOSPITAL_COMMUNITY)
Admission: RE | Admit: 2011-02-01 | Discharge: 2011-02-01 | Disposition: A | Discharge status: Home / Self Care | Payer: Medicare Other | Source: Ambulatory Visit | Attending: Internal Medicine | Admitting: Internal Medicine

## 2011-02-01 ENCOUNTER — Encounter (HOSPITAL_COMMUNITY): Payer: Self-pay | Admitting: *Deleted

## 2011-02-01 ENCOUNTER — Other Ambulatory Visit: Payer: Self-pay | Admitting: Internal Medicine

## 2011-02-01 ENCOUNTER — Other Ambulatory Visit: Payer: Self-pay | Admitting: Gastroenterology

## 2011-02-01 ENCOUNTER — Encounter (HOSPITAL_COMMUNITY)
Admission: RE | Disposition: A | Discharge status: Home / Self Care | Payer: Self-pay | Source: Ambulatory Visit | Attending: Internal Medicine

## 2011-02-01 DIAGNOSIS — K625 Hemorrhage of anus and rectum: Secondary | ICD-10-CM

## 2011-02-01 DIAGNOSIS — K620 Anal polyp: Secondary | ICD-10-CM

## 2011-02-01 DIAGNOSIS — Z860100 Personal history of colon polyps, unspecified: Secondary | ICD-10-CM | POA: Insufficient documentation

## 2011-02-01 DIAGNOSIS — K210 Gastro-esophageal reflux disease with esophagitis: Secondary | ICD-10-CM

## 2011-02-01 DIAGNOSIS — K5730 Diverticulosis of large intestine without perforation or abscess without bleeding: Secondary | ICD-10-CM

## 2011-02-01 DIAGNOSIS — D72819 Decreased white blood cell count, unspecified: Secondary | ICD-10-CM

## 2011-02-01 DIAGNOSIS — K921 Melena: Secondary | ICD-10-CM | POA: Insufficient documentation

## 2011-02-01 DIAGNOSIS — K621 Rectal polyp: Secondary | ICD-10-CM

## 2011-02-01 DIAGNOSIS — R1013 Epigastric pain: Secondary | ICD-10-CM

## 2011-02-01 DIAGNOSIS — D129 Benign neoplasm of anus and anal canal: Secondary | ICD-10-CM | POA: Insufficient documentation

## 2011-02-01 DIAGNOSIS — D128 Benign neoplasm of rectum: Secondary | ICD-10-CM | POA: Insufficient documentation

## 2011-02-01 DIAGNOSIS — K648 Other hemorrhoids: Secondary | ICD-10-CM | POA: Insufficient documentation

## 2011-02-01 HISTORY — PX: ESOPHAGOGASTRODUODENOSCOPY: SHX5428

## 2011-02-01 HISTORY — PX: COLONOSCOPY: SHX5424

## 2011-02-01 SURGERY — COLONOSCOPY
Anesthesia: Moderate Sedation

## 2011-02-01 MED ORDER — HYDROCORTISONE ACETATE 25 MG RE SUPP: 25.0000 mg | Freq: Two times a day (BID) | RECTAL | Status: AC

## 2011-02-01 MED ORDER — MIDAZOLAM HCL 5 MG/5ML IJ SOLN
INTRAMUSCULAR | Status: DC | PRN
  Administered 2011-02-01: 1 mg via INTRAVENOUS
  Administered 2011-02-01: 2 mg via INTRAVENOUS
  Administered 2011-02-01: 1 mg via INTRAVENOUS

## 2011-02-01 MED ORDER — STERILE WATER FOR IRRIGATION IR SOLN
Status: DC | PRN
  Administered 2011-02-01: 11:00:00

## 2011-02-01 MED ORDER — BUTAMBEN-TETRACAINE-BENZOCAINE 2-2-14 % EX AERO
INHALATION_SPRAY | CUTANEOUS | Status: DC | PRN
  Administered 2011-02-01: 2 via TOPICAL

## 2011-02-01 MED ORDER — MEPERIDINE HCL 100 MG/ML IJ SOLN
INTRAMUSCULAR | Status: DC | PRN
  Administered 2011-02-01: 50 mg via INTRAVENOUS
  Administered 2011-02-01: 25 mg via INTRAVENOUS

## 2011-02-01 MED ORDER — MEPERIDINE HCL 100 MG/ML IJ SOLN
INTRAMUSCULAR | Status: AC
  Filled 2011-02-01: qty 2

## 2011-02-01 MED ORDER — MIDAZOLAM HCL 5 MG/5ML IJ SOLN
INTRAMUSCULAR | Status: AC
  Filled 2011-02-01: qty 10

## 2011-02-01 NOTE — H&P (Signed)
  I have seen & examined the patient prior to the procedure(s) today and reviewed the history and physical/consultation.  There have been no changes.  After consideration of the risks, benefits, alternatives and imponderables, the patient has consented to the procedure(s).   

## 2011-02-01 NOTE — Op Note (Signed)
[facility] [address] [place], [place]  [address]  ENDOSCOPY PROCEDURE REPORT  PATIENT:  Walter, [name]  MR#:  [id number] BIRTHDATE:  Jul 08, 1940, 70 yrs. old  GENDER:  male  ENDOSCOPIST:  R. Roetta Sessions, MD FACP [facility] Referred by:           Dr. Benjaman Pott  PROCEDURE DATE:  02/01/2011 PROCEDURE:   Diagnostic EGD  INDICATIONS:   epigastric pain  INFORMED CONSENT:   The risks, benefits, limitations, alternatives and imponderables have been discussed.  The potential for biopsy, esophogeal dilation, etc. have also been reviewed.  Questions have been answered.  All parties agreeable.  Please see the history and physical in the medical record for more information.  MEDICATIONS:     Versed 3 mg IV and Demerol 75 mg IV. Cetacaine spray.  DESCRIPTION OF PROCEDURE:   The EG-[id number] ([id number]) endoscope was introduced through the mouth and advanced to the second portion of the duodenum without difficulty or limitations.  The mucosal surfaces were surveyed very carefully during advancement of the scope and upon withdrawal.  Retroflexion view of the proximal stomach and esophagogastric junction was performed.  <<PROCEDUREIMAGES>>  FINDINGS:    distal esophageal erosions extending 2 cm into the distal  esophagus. No Barrett's esophagus. small hiatal hernia; otherwise normal stomach. Normal first and second portion of the duodenum  THERAPEUTIC / DIAGNOSTIC MANEUVERS PERFORMED: none  COMPLICATIONS:   None  IMPRESSION:    Erosive reflux esophagitis and small hiatal hernia. esophagitis likely the cause of his epigastric pain  RECOMMENDATIONS:     Begin PPI. See colonoscopy report  ______________________________ R. Roetta Sessions, MD Caleen Essex  CC:  Butch Penny, MD  n. Rosalie Doctor:   R. Roetta Sessions at 02/01/2011 11:43 AM  Claybon Jabs, [phone]

## 2011-02-02 NOTE — Op Note (Signed)
[facility] [address] [place], [place]  [address]  COLONOSCOPY PROCEDURE REPORT  PATIENT:  [name], [name]  MR#:  [id number] BIRTHDATE:  13-Aug-1940, 70 yrs. old  GENDER:  male ENDOSCOPIST:  R. Roetta Sessions, MD FACP [facility] REF. BY:           Dr. Thalia Party McIinnis PROCEDURE DATE:  02/01/2011 PROCEDURE:    colonoscopy with  biopsy  INDICATIONS:   hematochezia/history  colonic polyps  INFORMED CONSENT:  The risks, benefits, alternatives and imponderables including but not limited to bleeding, perforation as well as the possibility of a missed lesion have been reviewed. The potential for biopsy, lesion removal, etc. have also been discussed.  Questions have been answered.  All parties agreeable. Please see the history and physical in the medical record for more information.  MEDICATIONS:   Versed 4 mg IV and Demerol 75 mg IV in divided doses  DESCRIPTION OF PROCEDURE:  After a digital rectal exam was performed, the EC-[id number] ([id number]) colonoscope was advanced from the anus through the rectum and colon to the area of the cecum, ileocecal valve and appendiceal orifice.  The cecum was deeply intubated.  These structures were well-seen and photographed for the record.  From the level of the cecum and ileocecal valve, the scope was slowly and cautiously withdrawn.  The mucosal surfaces were carefully surveyed utilizing scope tip deflection to facilitate fold flattening as needed.  The scope was pulled down into the rectum where a thorough examination including retroflexion was performed. <<PROCEDUREIMAGES>>  FINDINGS:   Adequate preparation. redundant colon particularly on the right side requiring a number of maneuvers including changing patient's position and external abdominal pressure to reach the cecum. the patient had scattered pancolonic diverticula; the remainder of the colonic mucosa appeared normal. Normal distal 10 cm of terminal ileal mucosa. the rectum was normal  aside from a single diminutive polyp at 10 cm in the anal verge and friable and a canal hemorrhoids  THERAPEUTIC / DIAGNOSTIC MANEUVERS PERFORMED:   The rectal polyp removed with cold biopsy forcep technique  COMPLICATIONS:   none  CECAL WITHDRAWAL TIME: 8 minutes  IMPRESSION:    Friable anal canal hemorrhoids -- likely source of hematochezia. Single diminutive rectal polyp removed. colonic diverticulosis  RECOMMENDATIONS:      Daily fiber supplement. Course of Anusol suppositories. Follow up on pathology. See EGD report.  ______________________________ R. Roetta Sessions, MD Caleen Essex  CC:  Butch Penny, MD  n. Rosalie Doctor:   R. Roetta Sessions at 02/01/2011 12:16 PM  Claybon Jabs,   [phone]

## 2011-02-09 ENCOUNTER — Encounter (HOSPITAL_COMMUNITY): Payer: Self-pay | Admitting: Internal Medicine

## 2011-03-17 ENCOUNTER — Telehealth: Payer: Self-pay

## 2011-03-17 MED ORDER — DEXLANSOPRAZOLE 60 MG PO CPDR: 60.0000 mg | DELAYED_RELEASE_CAPSULE | Freq: Every day | ORAL | Status: AC

## 2011-03-17 NOTE — Telephone Encounter (Signed)
Per patient instructions in EPIC: was started on Dexilant. Let's restart it and if symptoms don't settle down then OV. RX sent to [facility]. Please let pt know. Await labs.

## 2011-03-17 NOTE — Telephone Encounter (Signed)
Pt called about his lab work that needs to be done. He is also having some nausea and a grabbing in his stomach. He is not on any PPI because he is out and does not know what kind he was taking.RMR's office/procedure note does not have what kind of PPI he put him on. I try to give him an office appointment but he is having to take care of his mother and could not come in. Please advise

## 2011-03-18 NOTE — Telephone Encounter (Signed)
Pt is aware of Rx and will call if it does not work.

## 2011-06-24 ENCOUNTER — Ambulatory Visit (INDEPENDENT_AMBULATORY_CARE_PROVIDER_SITE_OTHER): Payer: Medicare Other | Admitting: Urology

## 2011-06-24 DIAGNOSIS — N320 Bladder-neck obstruction: Secondary | ICD-10-CM

## 2011-06-24 DIAGNOSIS — N529 Male erectile dysfunction, unspecified: Secondary | ICD-10-CM

## 2011-06-24 DIAGNOSIS — Z8546 Personal history of malignant neoplasm of prostate: Secondary | ICD-10-CM

## 2011-10-18 ENCOUNTER — Other Ambulatory Visit (HOSPITAL_COMMUNITY): Payer: Self-pay | Admitting: Family Medicine

## 2011-10-18 ENCOUNTER — Ambulatory Visit (HOSPITAL_COMMUNITY)
Admission: RE | Admit: 2011-10-18 | Discharge: 2011-10-18 | Disposition: A | Discharge status: Home / Self Care | Payer: Medicare Other | Source: Ambulatory Visit | Attending: Family Medicine | Admitting: Family Medicine

## 2011-10-18 DIAGNOSIS — R0600 Dyspnea, unspecified: Secondary | ICD-10-CM

## 2011-10-18 DIAGNOSIS — R0602 Shortness of breath: Secondary | ICD-10-CM | POA: Insufficient documentation

## 2011-10-22 ENCOUNTER — Emergency Department (HOSPITAL_COMMUNITY): Payer: Medicare Other

## 2011-10-22 ENCOUNTER — Encounter (HOSPITAL_COMMUNITY): Payer: Self-pay | Admitting: *Deleted

## 2011-10-22 ENCOUNTER — Emergency Department (HOSPITAL_COMMUNITY)
Admission: EM | Admit: 2011-10-22 | Discharge: 2011-10-22 | Disposition: A | Discharge status: Home / Self Care | Payer: Medicare Other

## 2011-10-22 ENCOUNTER — Emergency Department (HOSPITAL_COMMUNITY)
Admission: EM | Admit: 2011-10-22 | Discharge: 2011-10-22 | Disposition: A | Discharge status: Home / Self Care | Payer: Medicare Other | Attending: Emergency Medicine | Admitting: Emergency Medicine

## 2011-10-22 DIAGNOSIS — Z8546 Personal history of malignant neoplasm of prostate: Secondary | ICD-10-CM | POA: Insufficient documentation

## 2011-10-22 DIAGNOSIS — R109 Unspecified abdominal pain: Secondary | ICD-10-CM | POA: Insufficient documentation

## 2011-10-22 DIAGNOSIS — K439 Ventral hernia without obstruction or gangrene: Secondary | ICD-10-CM | POA: Insufficient documentation

## 2011-10-22 DIAGNOSIS — Z981 Arthrodesis status: Secondary | ICD-10-CM | POA: Insufficient documentation

## 2011-10-22 DIAGNOSIS — M549 Dorsalgia, unspecified: Secondary | ICD-10-CM | POA: Insufficient documentation

## 2011-10-22 LAB — URINE MICROSCOPIC-ADD ON

## 2011-10-22 LAB — COMPREHENSIVE METABOLIC PANEL
Alkaline Phosphatase: 69 U/L (ref 39–117)
BUN: 17 mg/dL (ref 6–23)
Chloride: 97 mEq/L (ref 96–112)
Creatinine, Ser: 1.01 mg/dL (ref 0.50–1.35)
GFR calc Af Amer: 84 mL/min — ABNORMAL LOW (ref >90)
Glucose, Bld: 123 mg/dL — ABNORMAL HIGH (ref 70–99)
Potassium: 3.3 mEq/L — ABNORMAL LOW (ref 3.5–5.1)
Total Bilirubin: 0.8 mg/dL (ref 0.3–1.2)
Total Protein: 7.3 g/dL (ref 6.0–8.3)

## 2011-10-22 LAB — CBC WITH DIFFERENTIAL/PLATELET
Eosinophils Absolute: 0 10*3/uL (ref 0.0–0.7)
HCT: 45.1 % (ref 39.0–52.0)
Hemoglobin: 15.7 g/dL (ref 13.0–17.0)
Lymphs Abs: 1 10*3/uL (ref 0.7–4.0)
MCH: 31 pg (ref 26.0–34.0)
MCHC: 34.8 g/dL (ref 30.0–36.0)
Monocytes Absolute: 0.4 10*3/uL (ref 0.1–1.0)
Monocytes Relative: 7 % (ref 3–12)
Neutrophils Relative %: 76 % (ref 43–77)
RBC: 5.07 MIL/uL (ref 4.22–5.81)

## 2011-10-22 LAB — URINALYSIS, ROUTINE W REFLEX MICROSCOPIC
Bilirubin Urine: NEGATIVE
Ketones, ur: NEGATIVE mg/dL
Leukocytes, UA: NEGATIVE
Nitrite: NEGATIVE
Urobilinogen, UA: 0.2 mg/dL (ref 0.0–1.0)

## 2011-10-22 LAB — LIPASE, BLOOD: Lipase: 25 U/L (ref 11–59)

## 2011-10-22 MED ORDER — HYDROCODONE-ACETAMINOPHEN 5-325 MG PO TABS: 1.0000 | ORAL_TABLET | Freq: Four times a day (QID) | ORAL | Status: AC | PRN

## 2011-10-22 MED ORDER — PANTOPRAZOLE SODIUM 40 MG IV SOLR
40.0000 mg | Freq: Once | INTRAVENOUS | Status: AC
  Administered 2011-10-22: 40 mg via INTRAVENOUS
  Filled 2011-10-22: qty 40

## 2011-10-22 MED ORDER — ONDANSETRON HCL 4 MG/2ML IJ SOLN
4.0000 mg | Freq: Once | INTRAMUSCULAR | Status: AC
  Administered 2011-10-22: 4 mg via INTRAVENOUS
  Filled 2011-10-22: qty 2

## 2011-10-22 MED ORDER — PANTOPRAZOLE SODIUM 40 MG IV SOLR
40.0000 mg | Freq: Once | INTRAVENOUS | Status: AC
Start: 2011-10-22 — End: 2011-10-22

## 2011-10-22 MED ORDER — HYDROMORPHONE HCL PF 1 MG/ML IJ SOLN
1.0000 mg | Freq: Once | INTRAMUSCULAR | Status: AC
  Administered 2011-10-22: 1 mg via INTRAVENOUS
  Filled 2011-10-22: qty 1

## 2011-10-22 MED ORDER — SODIUM CHLORIDE 0.9 % IV SOLN: 1000.0000 mL | INTRAVENOUS | Status: DC

## 2011-10-22 MED ORDER — HYDROMORPHONE HCL PF 1 MG/ML IJ SOLN
2.0000 mg | Freq: Once | INTRAMUSCULAR | Status: AC
  Administered 2011-10-22: 2 mg via INTRAVENOUS
  Filled 2011-10-22: qty 2

## 2011-10-22 MED ORDER — HYDROMORPHONE HCL PF 1 MG/ML IJ SOLN: 1.0000 mg | Freq: Once | INTRAMUSCULAR | Status: AC

## 2011-10-22 MED ORDER — PANTOPRAZOLE SODIUM 20 MG PO TBEC: 20.0000 mg | DELAYED_RELEASE_TABLET | Freq: Every day | ORAL | Status: DC

## 2011-10-22 MED ORDER — SODIUM CHLORIDE 0.9 % IV SOLN
Freq: Once | INTRAVENOUS | Status: AC
  Administered 2011-10-22: 19:00:00 via INTRAVENOUS

## 2011-10-22 MED ORDER — ONDANSETRON HCL 4 MG/2ML IJ SOLN: 4.0000 mg | Freq: Once | INTRAMUSCULAR | Status: AC

## 2011-10-22 NOTE — ED Notes (Addendum)
Pt told greeter that he was leaving because he did not have time to sit here all day. Pt left without signing. No active vomiting noted while pt was in waiting area.

## 2011-10-22 NOTE — ED Notes (Signed)
Pt to be triaged in tx room. Nurse went to bring pt back to room but registration staff stated pt would not wait for triage and left

## 2011-10-22 NOTE — ED Notes (Signed)
Pt ambulatory with steady gait; Pt instructed not to drive while on pain medication also not to take extra tylenol. Pt verbalizes understanding, Pt verbalizes understanding about follow up appointments with primary MD and surgeon. Family at bedside also verbalizes understanding

## 2011-10-22 NOTE — ED Notes (Signed)
Pt states upper abdominal pain, belching since last night. Pt states it feels like gas. States pain will move from abdomen to left chest to back. Denies CP at this times. Pt was here earlier today but left after 11 min of waiting to be triaged.

## 2011-10-22 NOTE — ED Provider Notes (Signed)
History   This chart was scribed for Benny Lennert, MD scribed by Magnus Sinning. The patient was seen in room APA01/APA01 at 18:47   CSN: [id number]  Arrival date & time 10/22/11  1821     Chief Complaint  Patient presents with  . Abdominal Pain    (Consider location/radiation/quality/duration/timing/severity/associated sxs/prior treatment) HPI Comments: [name] is a 71 y.o. male who presents to the Emergency Department complaining of constant moderate abd pain, episode onset yesterday, but intermittent over the past two weeks. Pt explains he began having abd pain two weeks ago when he was at church. States the pain resolved after two days. Says this episode began last night and is worsened when lifting, which he's done a lot of this week relative says. He describes it as feeling like gas and explains he has had similar sxs since cholecystectomy. Pt unsure of time of cholecystectomy and says Dr. Jena Gauss is his gastroenterologist.    Patient is a 71 y.o. male presenting with abdominal pain. The history is provided by the patient. No language interpreter was used.  Abdominal Pain The primary symptoms of the illness include abdominal pain. The primary symptoms of the illness do not include fever, fatigue, nausea, vomiting or diarrhea. The current episode started yesterday. The onset of the illness was gradual. The problem has not changed since onset. The patient has not had a change in bowel habit. Additional symptoms associated with the illness include back pain. Symptoms associated with the illness do not include hematuria or frequency.    Past Medical History  Diagnosis Date  . Prostate ca     seed implants  . Arthritis   . Numbness     left leg after back surgery  . Chronic back pain   . GERD (gastroesophageal reflux disease)   . Hx of adenomatous colonic polyps   . Schatzki's ring     History of  . Helicobacter pylori gastritis     Treated  . IBS (irritable bowel syndrome)      Past Surgical History  Procedure Date  . Appendectomy age 9  . Back surgery 02/08    [facility]  . Back surgery 09/24/04    [facility], [facility]  . Back surgery 09/18/02    [facility]  . Cholecystectomy 03/04/05    [facility], [facility]. Gangrenous cholecystitis complicated by abscess requiring percutaneous drainage. He also had common duct stones requiring ERCP and sphincterotomy.  . Ercp 03/02/05    [facility], [facility]. Normal-appearing biliary tree (gallbladder not image), status post sphincterotomy with recovery of small pieces of stone material, status post balloon occlusion cholangiogram.  . Cataract extraction w/phaco 11/22/2010    Procedure: CATARACT EXTRACTION PHACO AND INTRAOCULAR LENS PLACEMENT (IOC);  Surgeon: Susa Simmonds;  Location: [facility];  Service: Ophthalmology;  Laterality: Right;  CDE: 6.81  . Esophagogastroduodenoscopy August 2005    Erosive esophagitis and Schatzki ring, small hiatal hernia  . Colonoscopy August 2005    Scattered sigmoid diverticulosis, splenic flexure polyp. Hyperplastic  . Colonoscopy 1997    3 cm tubular adenoma and a sending colon  . Incomplete colonoscopy December 2009    Left-sided diverticula, mid descending colon polyp, due to recurrent looping and redundancy exam was incomplete. It was felt that the mid colon was reached. Followup barium enema showed colon interposition between the liver and the diaphragm, redundant sigmoid colon but no colon mass or polyp identified.. Pathology revealed tubular adenoma.  . Esophagogastroduodenoscopy 01/2008    Mild reflux esophagitis, small hiatal  hernia  . Colonoscopy 02/01/2011    Procedure: COLONOSCOPY;  Surgeon: Corbin Ade, MD;  Location: [facility];  Service: Endoscopy;  Laterality: N/A;  1:45  . Esophagogastroduodenoscopy 02/01/2011    Procedure: ESOPHAGOGASTRODUODENOSCOPY (EGD);  Surgeon: Corbin Ade, MD;  Location: [facility];  Service: Endoscopy;  Laterality: N/A;    Family History  Problem Relation Age of Onset    . Hypotension Neg Hx   . Anesthesia problems Neg Hx   . Malignant hyperthermia Neg Hx   . Pseudochol deficiency Neg Hx   . Diabetes Mother   . Coronary artery disease Father   . Coronary artery disease Mother   . Diabetes Father   . Stomach cancer Father   . Colon cancer Neg Hx     History  Substance Use Topics  . Smoking status: Never Smoker   . Smokeless tobacco: Not on file  . Alcohol Use: No      Review of Systems  Constitutional: Negative for fever and fatigue.  HENT: Negative for congestion, sinus pressure and ear discharge.   Eyes: Negative for discharge.  Respiratory: Negative for cough.   Cardiovascular: Negative for chest pain.  Gastrointestinal: Positive for abdominal pain. Negative for nausea, vomiting and diarrhea.  Genitourinary: Negative for frequency and hematuria.  Musculoskeletal: Positive for back pain.  Skin: Negative for rash.  Neurological: Negative for seizures and headaches.  Hematological: Negative.   Psychiatric/Behavioral: Negative for hallucinations.  All other systems reviewed and are negative.    Allergies  Review of patient's allergies indicates no known allergies.  Home Medications   Current Outpatient Rx  Name Route Sig Dispense Refill  . POLYETHYLENE GLYCOL 3350 PO POWD  Take 17 g every evening on days that you do not have a bowel movement 527 g 11    BP 203/69  Pulse 54  Temp 97.7 F (36.5 C) (Oral)  Resp 16  Ht 6\' 2"  (1.88 m)  Wt 255 lb (115.667 kg)  BMI 32.74 kg/m2  SpO2 97%  Physical Exam  Nursing note and vitals reviewed. Constitutional: He is oriented to person, place, and time. He appears well-developed.  HENT:  Head: Normocephalic and atraumatic.  Eyes: Conjunctivae and EOM are normal. No scleral icterus.  Neck: Neck supple.  Cardiovascular: Normal rate and regular rhythm.  Exam reveals no gallop and no friction rub.   No murmur heard. Pulmonary/Chest: He has no wheezes. He has no rales. He exhibits no  tenderness.  Abdominal: Soft. He exhibits no distension. There is tenderness. There is no rebound. A hernia is present.       Abdominal wall hernia at mid upper abd that reduces   Musculoskeletal: Normal range of motion. He exhibits no edema.  Neurological: He is oriented to person, place, and time. Coordination normal.  Skin: No rash noted. No erythema.  Psychiatric: He has a normal mood and affect. His behavior is normal.    ED Course  Procedures (including critical care time) DIAGNOSTIC STUDIES: Oxygen Saturation is 97% on room air, normal by my interpretation.    COORDINATION OF CARE: 20:56: Rechecked performed. Patient notes continued discomfort. Patient also notified of radiology and lab results. Recommended follow-up with Dr. Jena Gauss for eval of Hernia.   Labs Reviewed  CBC WITH DIFFERENTIAL - Abnormal; Notable for the following:    Platelets 141 (*)     All other components within normal limits  COMPREHENSIVE METABOLIC PANEL - Abnormal; Notable for the following:    Sodium 134 (*)  Potassium 3.3 (*)     Glucose, Bld 123 (*)     GFR calc non Af Amer 72 (*)     GFR calc Af Amer 84 (*)     All other components within normal limits  URINALYSIS, ROUTINE W REFLEX MICROSCOPIC - Abnormal; Notable for the following:    Hgb urine dipstick TRACE (*)     All other components within normal limits  LIPASE, BLOOD  URINE MICROSCOPIC-ADD ON   Dg Abd Acute W/chest  10/22/2011  *RADIOLOGY REPORT*  Clinical Data: Mid abdominal pain for 2 weeks, history prostate cancer  ACUTE ABDOMEN SERIES (ABDOMEN 2 VIEW & CHEST 1 VIEW)  Comparison: Chest radiograph 10/18/2011, abdominal radiograph 02/29/2008  Findings: Normal heart size, mediastinal contours, and pulmonary vascularity. Atherosclerotic calcification aorta. Chronic elevation of right diaphragm with colon interposition between liver and diaphragm. Right basilar atelectasis. Minimal bronchitic changes. No infiltrate or pleural effusion.  Prior  lumbar fusion L2-L5. Nonobstructive bowel gas pattern. No bowel dilatation, bowel wall thickening, or definite free intraperitoneal air. No definite acute osseous findings.  IMPRESSION: Chronic elevation of right diaphragm with right basilar atelectasis and colon interposition. No definite acute abdominal findings.   Original Report Authenticated By: Lollie Marrow, M.D.      No diagnosis found.    MDM  Abdominal hernia The chart was scribed for me under my direct supervision.  I personally performed the history, physical, and medical decision making and all procedures in the evaluation of this patient..    Date: 11/19/2011  Rate: 56  Rhythm: sinus bradycardia  QRS Axis: normal  Intervals: PR prolonged  ST/T Wave abnormalities: normal  Conduction Disutrbances:first-degree A-V block   Narrative Interpretation:   Old EKG Reviewed: none available        Benny Lennert, MD 10/22/11 2101  Benny Lennert, MD 11/19/11 1319

## 2011-10-24 ENCOUNTER — Encounter (HOSPITAL_COMMUNITY): Payer: Self-pay | Admitting: Emergency Medicine

## 2011-10-24 ENCOUNTER — Emergency Department (HOSPITAL_COMMUNITY)
Admission: EM | Admit: 2011-10-24 | Discharge: 2011-10-24 | Disposition: A | Discharge status: Home / Self Care | Payer: Medicare Other | Attending: Emergency Medicine | Admitting: Emergency Medicine

## 2011-10-24 ENCOUNTER — Encounter (HOSPITAL_COMMUNITY): Payer: Self-pay | Admitting: *Deleted

## 2011-10-24 ENCOUNTER — Emergency Department (HOSPITAL_COMMUNITY): Payer: Medicare Other

## 2011-10-24 ENCOUNTER — Observation Stay (HOSPITAL_COMMUNITY): Payer: Medicare Other

## 2011-10-24 ENCOUNTER — Inpatient Hospital Stay (HOSPITAL_COMMUNITY)
Admission: EM | Admit: 2011-10-24 | Discharge: 2011-10-29 | DRG: 392 | Disposition: A | Discharge status: Home / Self Care | Payer: Medicare Other | Attending: Family Medicine | Admitting: Family Medicine

## 2011-10-24 DIAGNOSIS — K589 Irritable bowel syndrome without diarrhea: Secondary | ICD-10-CM | POA: Diagnosis present

## 2011-10-24 DIAGNOSIS — Z8546 Personal history of malignant neoplasm of prostate: Secondary | ICD-10-CM | POA: Insufficient documentation

## 2011-10-24 DIAGNOSIS — K2960 Other gastritis without bleeding: Secondary | ICD-10-CM | POA: Diagnosis present

## 2011-10-24 DIAGNOSIS — R079 Chest pain, unspecified: Secondary | ICD-10-CM | POA: Insufficient documentation

## 2011-10-24 DIAGNOSIS — Z833 Family history of diabetes mellitus: Secondary | ICD-10-CM

## 2011-10-24 DIAGNOSIS — K5730 Diverticulosis of large intestine without perforation or abscess without bleeding: Secondary | ICD-10-CM | POA: Insufficient documentation

## 2011-10-24 DIAGNOSIS — M129 Arthropathy, unspecified: Secondary | ICD-10-CM | POA: Diagnosis present

## 2011-10-24 DIAGNOSIS — R109 Unspecified abdominal pain: Principal | ICD-10-CM | POA: Diagnosis present

## 2011-10-24 DIAGNOSIS — K439 Ventral hernia without obstruction or gangrene: Secondary | ICD-10-CM | POA: Diagnosis present

## 2011-10-24 DIAGNOSIS — Z8249 Family history of ischemic heart disease and other diseases of the circulatory system: Secondary | ICD-10-CM

## 2011-10-24 DIAGNOSIS — G8929 Other chronic pain: Secondary | ICD-10-CM | POA: Diagnosis present

## 2011-10-24 DIAGNOSIS — K449 Diaphragmatic hernia without obstruction or gangrene: Secondary | ICD-10-CM | POA: Diagnosis present

## 2011-10-24 DIAGNOSIS — K219 Gastro-esophageal reflux disease without esophagitis: Secondary | ICD-10-CM | POA: Diagnosis present

## 2011-10-24 DIAGNOSIS — M545 Low back pain, unspecified: Secondary | ICD-10-CM | POA: Diagnosis present

## 2011-10-24 DIAGNOSIS — K5900 Constipation, unspecified: Secondary | ICD-10-CM | POA: Diagnosis present

## 2011-10-24 LAB — CBC WITH DIFFERENTIAL/PLATELET
Basophils Absolute: 0 10*3/uL (ref 0.0–0.1)
Basophils Absolute: 0 10*3/uL (ref 0.0–0.1)
Basophils Relative: 0 % (ref 0–1)
Eosinophils Absolute: 0.1 10*3/uL (ref 0.0–0.7)
HCT: 47.1 % (ref 39.0–52.0)
Hemoglobin: 16.1 g/dL (ref 13.0–17.0)
Lymphocytes Relative: 9 % — ABNORMAL LOW (ref 12–46)
Lymphs Abs: 0.6 10*3/uL — ABNORMAL LOW (ref 0.7–4.0)
MCH: 31.3 pg (ref 26.0–34.0)
MCHC: 35 g/dL (ref 30.0–36.0)
Monocytes Absolute: 0.7 10*3/uL (ref 0.1–1.0)
Monocytes Relative: 9 % (ref 3–12)
Neutro Abs: 4.9 10*3/uL (ref 1.7–7.7)
Neutro Abs: 5.6 10*3/uL (ref 1.7–7.7)
Neutrophils Relative %: 76 % (ref 43–77)
Platelets: 127 10*3/uL — ABNORMAL LOW (ref 150–400)
Platelets: 113 10*3/uL — ABNORMAL LOW (ref 150–400)
RBC: 5.23 MIL/uL (ref 4.22–5.81)
RDW: 12.9 % (ref 11.5–15.5)
RDW: 13 % (ref 11.5–15.5)
WBC: 6.8 10*3/uL (ref 4.0–10.5)

## 2011-10-24 LAB — COMPREHENSIVE METABOLIC PANEL
ALT: 15 U/L (ref 0–53)
AST: 20 U/L (ref 0–37)
Albumin: 3.8 g/dL (ref 3.5–5.2)
Alkaline Phosphatase: 71 U/L (ref 39–117)
BUN: 8 mg/dL (ref 6–23)
Chloride: 99 mEq/L (ref 96–112)
Potassium: 3.6 mEq/L (ref 3.5–5.1)
Sodium: 138 mEq/L (ref 135–145)
Total Bilirubin: 1.1 mg/dL (ref 0.3–1.2)
Total Protein: 7.5 g/dL (ref 6.0–8.3)

## 2011-10-24 LAB — POCT I-STAT, CHEM 8
Chloride: 100 mEq/L (ref 96–112)
Creatinine, Ser: 1.1 mg/dL (ref 0.50–1.35)
Glucose, Bld: 106 mg/dL — ABNORMAL HIGH (ref 70–99)
HCT: 48 % (ref 39.0–52.0)
Hemoglobin: 16.3 g/dL (ref 13.0–17.0)
Potassium: 3.6 mEq/L (ref 3.5–5.1)
Sodium: 140 mEq/L (ref 135–145)

## 2011-10-24 LAB — URINALYSIS, ROUTINE W REFLEX MICROSCOPIC
Bilirubin Urine: NEGATIVE
Glucose, UA: NEGATIVE mg/dL
Ketones, ur: NEGATIVE mg/dL
Nitrite: NEGATIVE
Specific Gravity, Urine: 1.01 (ref 1.005–1.030)
pH: 7.5 (ref 5.0–8.0)

## 2011-10-24 LAB — LIPASE, BLOOD: Lipase: 22 U/L (ref 11–59)

## 2011-10-24 LAB — BASIC METABOLIC PANEL
CO2: 34 mEq/L — ABNORMAL HIGH (ref 19–32)
Chloride: 96 mEq/L (ref 96–112)
Glucose, Bld: 128 mg/dL — ABNORMAL HIGH (ref 70–99)
Sodium: 136 mEq/L (ref 135–145)

## 2011-10-24 LAB — MRSA PCR SCREENING: MRSA by PCR: NEGATIVE

## 2011-10-24 MED ORDER — ONDANSETRON HCL 4 MG/2ML IJ SOLN
4.0000 mg | Freq: Once | INTRAMUSCULAR | Status: AC
  Administered 2011-10-24: 4 mg via INTRAVENOUS
  Filled 2011-10-24: qty 2

## 2011-10-24 MED ORDER — MAGNESIUM CITRATE PO SOLN: 296.0000 mL | Freq: Once | ORAL | Status: AC

## 2011-10-24 MED ORDER — DIPHENHYDRAMINE HCL 50 MG/ML IJ SOLN
25.0000 mg | Freq: Once | INTRAMUSCULAR | Status: AC
  Administered 2011-10-24: 25 mg via INTRAVENOUS
  Filled 2011-10-24: qty 1

## 2011-10-24 MED ORDER — ENOXAPARIN SODIUM 40 MG/0.4ML ~~LOC~~ SOLN
40.0000 mg | SUBCUTANEOUS | Status: DC
  Administered 2011-10-24 – 2011-10-28 (×5): 40 mg via SUBCUTANEOUS
  Filled 2011-10-24 (×5): qty 0.4

## 2011-10-24 MED ORDER — SODIUM CHLORIDE 0.9 % IV SOLN
Freq: Once | INTRAVENOUS | Status: AC
  Administered 2011-10-24: 20 mL/h via INTRAVENOUS

## 2011-10-24 MED ORDER — PANTOPRAZOLE SODIUM 40 MG PO TBEC
40.0000 mg | DELAYED_RELEASE_TABLET | Freq: Every day | ORAL | Status: DC
  Administered 2011-10-24 – 2011-10-26 (×3): 40 mg via ORAL
  Filled 2011-10-24 (×3): qty 1

## 2011-10-24 MED ORDER — HYDROMORPHONE HCL PF 1 MG/ML IJ SOLN
1.0000 mg | Freq: Once | INTRAMUSCULAR | Status: AC
  Administered 2011-10-24: 1 mg via INTRAVENOUS
  Filled 2011-10-24: qty 1

## 2011-10-24 MED ORDER — HYDROMORPHONE HCL PF 1 MG/ML IJ SOLN
1.0000 mg | INTRAMUSCULAR | Status: DC | PRN
  Administered 2011-10-24 – 2011-10-25 (×3): 1 mg via INTRAVENOUS
  Filled 2011-10-24 (×3): qty 1

## 2011-10-24 MED ORDER — DOCUSATE SODIUM 100 MG PO CAPS
100.0000 mg | ORAL_CAPSULE | Freq: Two times a day (BID) | ORAL | Status: DC
  Administered 2011-10-24 – 2011-10-28 (×8): 100 mg via ORAL
  Filled 2011-10-24 (×12): qty 1

## 2011-10-24 MED ORDER — METOCLOPRAMIDE HCL 5 MG/ML IJ SOLN
10.0000 mg | Freq: Once | INTRAMUSCULAR | Status: AC
  Administered 2011-10-24: 10 mg via INTRAVENOUS
  Filled 2011-10-24: qty 2

## 2011-10-24 MED ORDER — IOHEXOL 350 MG/ML SOLN
100.0000 mL | Freq: Once | INTRAVENOUS | Status: AC | PRN
  Administered 2011-10-24: 100 mL via INTRAVENOUS

## 2011-10-24 MED ORDER — GI COCKTAIL ~~LOC~~
30.0000 mL | Freq: Once | ORAL | Status: AC
  Administered 2011-10-24: 30 mL via ORAL
  Filled 2011-10-24: qty 30

## 2011-10-24 MED ORDER — MINERAL OIL RE ENEM: 1.0000 | ENEMA | Freq: Once | RECTAL | Status: DC

## 2011-10-24 MED ORDER — DOCUSATE SODIUM 100 MG PO CAPS: 100.0000 mg | ORAL_CAPSULE | Freq: Two times a day (BID) | ORAL | Status: AC

## 2011-10-24 NOTE — ED Notes (Signed)
Report called to Shanda Bumps, RN on ICU - pt. To overflow room.

## 2011-10-24 NOTE — ED Notes (Signed)
Dr. Hyacinth Meeker in to speak with patient again

## 2011-10-24 NOTE — ED Notes (Signed)
Patient continues to grunt, takes deep breaths, moans, and now one tear from right eye.  States the pain is so bad he feels like he could die, or wishes he would just go ahead and die, that he cannot take this much pain

## 2011-10-24 NOTE — ED Notes (Signed)
Patient still c/o abd. Pain/cramping.  Consulted w/Dr. Manus Gunning and gave Reglan and Benadryl IV prior to removing IV.  Respirations even and unlabored. Skin warm/dry. Discharge instructions reviewed with patient at this time. Patient given opportunity to voice concerns/ask questions. IV removed per policy and band-aid applied to site. Patient discharged at this time and left Emergency Department with steady gait.

## 2011-10-24 NOTE — H&P (Signed)
[name], [name] NO.:  [id number]  MEDICAL RECORD NO.:  [id number]  LOCATION:  IC12                          FACILITY:  [facility]  PHYSICIAN:  Raman Featherston G. Renard Matter, MD   DATE OF BIRTH:  [birth year]  DATE OF ADMISSION:  10/24/2011 DATE OF DISCHARGE:  LH                             HISTORY & PHYSICAL   This is a 71 year old male admitted to the ED with a chief complaint of lower abdominal pain.  The patient was seen and examined by ED physician.  He apparently had developed a mid abdominal pain, which had gradually become worse over the past few days.  He apparently had a bowel movement in the past 36 hours.  The pain was markedly severe.  He does have a ventral hernia.  The patient did have a CT of the abdomen, which showed mild atherosclerotic change of the abdominal aorta without aneurysm, dissection.  No evidence of mesenteric ischemia.  No evidence of diverticulitis.  Prior acute abdominal series showed chronic elevation of right diaphragm with right basilar atelectasis.  No acute findings.  The patient was given intravenously IV pain medications.  He apparently had elevated blood pressure in the emergency department 200/88.  He had some improvement with the intravenous pain medication, but not significantly.  It was felt the patient should be admitted for pain control and further evaluation.  Apparently, he had had some constipation prior to this and had not had a bowel movement for approximately 3 days.  SOCIAL HISTORY:  The patient does not smoke or use alcohol.  FAMILY HISTORY:  Positive for hypotension malignant hyperthermia, diabetes, coronary artery disease, colon cancer, and stomach cancer.  PAST MEDICAL HISTORY:  The patient has prior history of carcinoma of prostate treated by seed implants, chronic back pain, arthritis, GERD, Schatzki's ring, H. pylori gastritis, and irritable bowel syndrome.  PRIOR SURGICAL HISTORY:  Appendectomy, back surgery for  degenerative disk disease in 2008 and 2004, cholecystectomy.  ERCP by Dr. Haskel Khan with recovery of a small piece of stone, common bile duct.  The patient had a esophagogastroduodenoscopy in August 2005, colonoscopy 2005.  ALLERGIES:  The patient has no known allergies.  MEDICATION LIST: 1. Hydrocodone/acetaminophen 5/325 1 every 6 hours p.r.n. for pain. 2. Pantoprazole 20 mg daily. 3. Polyethylene glycol 17 g daily. 4. Docusate sodium 100 mg b.i.d. 5. Magnesium citrate 296 mL by mouth daily.  PHYSICAL EXAMINATION:  VITAL SIGNS:  Blood pressure 185/100, respirations 24, pulse 87, temperature 98.2. GENERAL:  The patient is alert but uncomfortable. HEENT:  Eyes PERRLA.  TM negative.  Oropharynx benign. NECK:  Supple.  No JVD or thyroid abnormalities. LUNGS:  Clear to P and A. HEART:  Regular rhythm.  No murmurs. ABDOMEN:  No palpable organs or masses.  No organomegaly.  Ventral wall hernia present, which is easily reducible. EXTERNAL GENITALIA:  Within normal limits. SKIN:  Warm and dry. NEUROLOGIC:  The patient is alert.  Cranial nerves intact.  No motor or sensory abnormalities in extremities.  Reflexes equal and normal.  ASSESSMENT: 1. The patient was admitted with severe lower abdominal pain and     obstipation for 3 days.  2. The patient does have a history of prostate cancer with seed     implants previously. 3. Chronic low back pain, prior history of back surgery.  The patient was admitted for pain control and further evaluation of abdominal pain.  PLAN:  To continue IV Dilaudid, and we will give the patient enemas tonight to get him to have a bowel movement if possible, obtain GI consult in a.m.     [name] G. Renard Matter, MD     AGM/MEDQ  D:  10/24/2011  T:  10/24/2011  Job:  [id number]

## 2011-10-24 NOTE — ED Provider Notes (Signed)
History   This chart was scribed for Joya Gaskins, MD by Charolett Bumpers . The patient was seen in room [facility]07/[facility]. Patient's care was started at 1524.    CSN: [id number]  Arrival date & time 10/24/11  1512   None     Chief Complaint  Patient presents with  . Abdominal Pain    HPI [name] is a 71 y.o. male who presents to the Emergency Department complaining of constant, severe abdominal pain for the past 2 days with associated constipation. Pt reports that he has not had a BM in the past day and reports his abdominal pain feels like gas. Pt reports associated chest pressure with gas. Pt denies any vomiting, fevers, difficulty breathing, dysuria, cough. Pt was seen here in ED this morning for the same complaint with normal CT scan findings. Pt reports that his pain is worse since being d/c this morning. Pt reports he has used a fleet enema and a laxative with no relief since he was discharged this morning. Pt has a h/o a cholecystomy.   PCP: Dr. Renard Matter Past Medical History  Diagnosis Date  . Arthritis   . Numbness     left leg after back surgery  . Chronic back pain   . GERD (gastroesophageal reflux disease)   . Hx of adenomatous colonic polyps   . Schatzki's ring     History of  . Helicobacter pylori gastritis     Treated  . IBS (irritable bowel syndrome)   . Prostate ca     seed implants    Past Surgical History  Procedure Date  . Appendectomy age 71  . Back surgery 02/08    [facility]  . Back surgery 09/24/04    lumbar, [facility]  . Back surgery 09/18/02    [facility]  . Cholecystectomy 03/04/05    [facility], [facility]. Gangrenous cholecystitis complicated by abscess requiring percutaneous drainage. He also had common duct stones requiring ERCP and sphincterotomy.  . Ercp 03/02/05    [facility], [facility]. Normal-appearing biliary tree (gallbladder not image), status post sphincterotomy with recovery of small pieces of stone material, status post balloon occlusion cholangiogram.  .  Cataract extraction w/phaco 11/22/2010    Procedure: CATARACT EXTRACTION PHACO AND INTRAOCULAR LENS PLACEMENT (IOC);  Surgeon: Susa Simmonds;  Location: [facility];  Service: Ophthalmology;  Laterality: Right;  CDE: 6.81  . Esophagogastroduodenoscopy August 2005    Erosive esophagitis and Schatzki ring, small hiatal hernia  . Colonoscopy August 2005    Scattered sigmoid diverticulosis, splenic flexure polyp. Hyperplastic  . Colonoscopy 1997    3 cm tubular adenoma and a sending colon  . Incomplete colonoscopy December 2009    Left-sided diverticula, mid descending colon polyp, due to recurrent looping and redundancy exam was incomplete. It was felt that the mid colon was reached. Followup barium enema showed colon interposition between the liver and the diaphragm, redundant sigmoid colon but no colon mass or polyp identified.. Pathology revealed tubular adenoma.  . Esophagogastroduodenoscopy 01/2008    Mild reflux esophagitis, small hiatal hernia  . Colonoscopy 02/01/2011    Procedure: COLONOSCOPY;  Surgeon: Corbin Ade, MD;  Location: [facility];  Service: Endoscopy;  Laterality: N/A;  1:45  . Esophagogastroduodenoscopy 02/01/2011    Procedure: ESOPHAGOGASTRODUODENOSCOPY (EGD);  Surgeon: Corbin Ade, MD;  Location: [facility];  Service: Endoscopy;  Laterality: N/A;    Family History  Problem Relation Age of Onset  . Hypotension Neg Hx   . Anesthesia problems  Neg Hx   . Malignant hyperthermia Neg Hx   . Pseudochol deficiency Neg Hx   . Diabetes Mother   . Coronary artery disease Father   . Coronary artery disease Mother   . Diabetes Father   . Stomach cancer Father   . Colon cancer Neg Hx     History  Substance Use Topics  . Smoking status: Never Smoker   . Smokeless tobacco: Not on file  . Alcohol Use: No      Review of Systems  Constitutional: Negative for fever.  Respiratory: Negative for cough and shortness of breath.   Cardiovascular: Positive for chest  pain.  Gastrointestinal: Positive for abdominal pain and constipation. Negative for vomiting.  Genitourinary: Negative for dysuria.  All other systems reviewed and are negative.    Allergies  Review of patient's allergies indicates no known allergies.  Home Medications   Current Outpatient Rx  Name Route Sig Dispense Refill  . DOCUSATE SODIUM 100 MG PO CAPS Oral Take 1 capsule (100 mg total) by mouth every 12 (twelve) hours. 30 capsule 0  . HYDROCODONE-ACETAMINOPHEN 5-325 MG PO TABS Oral Take 1 tablet by mouth every 6 (six) hours as needed for pain. 20 tablet 0  . MAGNESIUM CITRATE PO SOLN Oral Take 296 mLs by mouth once. OTC 300 mL 0  . PANTOPRAZOLE SODIUM 20 MG PO TBEC Oral Take 1 tablet (20 mg total) by mouth daily. 30 tablet 1  . POLYETHYLENE GLYCOL 3350 PO POWD  Take 17 g every evening on days that you do not have a bowel movement 527 g 11    BP 186/83  Pulse 86  Temp 98.4 F (36.9 C) (Oral)  Resp 20  Ht 6\' 2"  (1.88 m)  Wt 245 lb (111.131 kg)  BMI 31.46 kg/m2  SpO2 96%  Physical Exam CONSTITUTIONAL: Well developed/well nourished HEAD AND FACE: Normocephalic/atraumatic EYES: EOMI/PERRL ENMT: Mucous membranes moist NECK: supple no meningeal signs SPINE:entire spine nontender CV: S1/S2 noted, no murmurs/rubs/gallops noted LUNGS: Lungs are clear to auscultation bilaterally, no apparent distress ABDOMEN: soft, mild tenderness diffusely, scattered faint rash to the abdominal wall, no rebound or guarding, easily reducible ventral hernia.  GU:no cva tenderness. No inguinal hernia. No testicular tenderness.  Chaperone present NEURO: Pt is awake/alert, moves all extremitiesx4 EXTREMITIES: pulses normal, full ROM SKIN: warm, color normal PSYCH: no abnormalities of mood noted  ED Course  Procedures  DIAGNOSTIC STUDIES: Oxygen Saturation is 96% on room air, adequate by my interpretation.    COORDINATION OF CARE:  15:30-Discussed planned course of treatment with the  patient including an admission, who is agreeable at this time.   Pt with worsened abdominal pain despite extensive workup in ED this morning with constipation Given age/exam/history will admit for observation and monitoring D/w dr Renard Matter will admit for observation Rash can be monitored as inpatient as patient is unsure of when this started but it is not toxic appearing    Labs Reviewed - No data to display Ct Angio Chest Pe W/cm &/or Wo Cm  10/24/2011  *RADIOLOGY REPORT*  Clinical Data:  MID STERNAL CHEST PAIN, left flank and periumbilical abdominal pain, question dissection, no bowel movement for 2 days, history of prostate cancer, GERD, irritable bowel syndrome  CT ANGIOGRAPHY CHEST, ABDOMEN AND PELVIS  Technique:  Multidetector CT imaging through the chest, abdomen and pelvis was performed using the standard protocol during bolus administration of intravenous contrast.  Multiplanar reconstructed images including MIPs were obtained and reviewed to evaluate the vascular  anatomy.  Contrast: OMNIPAQUE IOHEXOL 350 MG/ML SOLN No oral contrast administered.  Comparison:  CT abdomen and pelvis 05/12/2010  CTA CHEST  Findings: Scattered atherosclerotic calcifications aorta and coronary arteries. No evidence of aortic aneurysm or dissection. Pulmonary arteries patent. No evidence of pulmonary embolism. No thoracic adenopathy. Subsegmental atelectasis at bases of right middle and right lower lobes. No pulmonary infiltrate, pleural effusion or pneumothorax. No acute osseous findings.   Review of the MIP images confirms the above findings.  IMPRESSION: Right basilar atelectasis. Scattered atherosclerotic disease changes of the aorta without aneurysm or dissection. No other intrathoracic abnormalities identified.  CTA ABDOMEN AND PELVIS  Findings: Scattered atherosclerotic calcifications of the aorta without aneurysm or dissection. Celiac, SMA, and IMA origins are patent without significant stenoses.  Gallbladder surgically absent. 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 Liver, spleen, pancreas, kidneys, and adrenal glands normal appearance. Small duodenal diverticulum. Mild sigmoid diverticulosis without evidence of diverticulitis. Brachytherapy seed implants within prostate bed.  Unremarkable bladder and ureters. Stomach and bowel loops normal appearance. No mass, adenopathy, free fluid or free air. No hernia or acute bony lesion. Prior L2-L5 fusion.   Review of the MIP images confirms the above findings.  IMPRESSION: Mild scattered atherosclerotic changes of the abdominal aorta without aneurysm, dissection, or evidence of mesenteric ischemia. Mild sigmoid diverticulosis without evidence of diverticulitis.   Original Report Authenticated By: Lollie Marrow, M.D.    Dg Abd Acute W/chest  10/22/2011  *RADIOLOGY REPORT*  Clinical Data: Mid abdominal pain for 2 weeks, history prostate cancer  ACUTE ABDOMEN SERIES (ABDOMEN 2 VIEW & CHEST 1 VIEW)  Comparison: Chest radiograph 10/18/2011, abdominal radiograph 02/29/2008  Findings: Normal heart size, mediastinal contours, and pulmonary vascularity. Atherosclerotic calcification aorta. Chronic elevation of right diaphragm with colon interposition between liver and diaphragm. Right basilar atelectasis. Minimal bronchitic changes. No infiltrate or pleural effusion.  Prior lumbar fusion L2-L5. Nonobstructive bowel gas pattern. No bowel dilatation, bowel wall thickening, or definite free intraperitoneal air. No definite acute osseous findings.  IMPRESSION: Chronic elevation of right diaphragm with right basilar atelectasis and colon interposition. No definite acute abdominal findings.   Original Report Authenticated By: Lollie Marrow, M.D.    Ct Cta Abd/pel W/cm &/or W/o Cm  10/24/2011  *RADIOLOGY REPORT*  Clinical Data:  MID STERNAL CHEST PAIN, left flank and periumbilical abdominal pain, question dissection, no bowel movement for 2 days, history of  prostate cancer, GERD, irritable bowel syndrome  CT ANGIOGRAPHY CHEST, ABDOMEN AND PELVIS  Technique:  Multidetector CT imaging through the chest, abdomen and pelvis was performed using the standard protocol during bolus administration of intravenous contrast.  Multiplanar reconstructed images including MIPs were obtained and reviewed to evaluate the vascular anatomy.  Contrast: OMNIPAQUE IOHEXOL 350 MG/ML SOLN No oral contrast administered.  Comparison:  CT abdomen and pelvis 05/12/2010  CTA CHEST  Findings: Scattered atherosclerotic calcifications aorta and coronary arteries. No evidence of aortic aneurysm or dissection. Pulmonary arteries patent. No evidence of pulmonary embolism. No thoracic adenopathy. Subsegmental atelectasis at bases of right middle and right lower lobes. No pulmonary infiltrate, pleural effusion or pneumothorax. No acute osseous findings.   Review of the MIP images confirms the above findings.  IMPRESSION: Right basilar atelectasis. Scattered atherosclerotic disease changes of the aorta without aneurysm or dissection. No other intrathoracic abnormalities identified.  CTA ABDOMEN AND PELVIS  Findings: Scattered atherosclerotic calcifications of the aorta without aneurysm or dissection. Celiac, SMA, and IMA origins are patent without significant stenoses. Gallbladder surgically  absent. 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 Liver, spleen, pancreas, kidneys, and adrenal glands normal appearance. Small duodenal diverticulum. Mild sigmoid diverticulosis without evidence of diverticulitis. Brachytherapy seed implants within prostate bed.  Unremarkable bladder and ureters. Stomach and bowel loops normal appearance. No mass, adenopathy, free fluid or free air. No hernia or acute bony lesion. Prior L2-L5 fusion.   Review of the MIP images confirms the above findings.  IMPRESSION: Mild scattered atherosclerotic changes of the abdominal aorta without aneurysm, dissection, or  evidence of mesenteric ischemia. Mild sigmoid diverticulosis without evidence of diverticulitis.   Original Report Authenticated By: Lollie Marrow, M.D.        MDM  Nursing notes including past medical history and social history reviewed and considered in documentation Previous records reviewed and considered    Date: 10/24/2011  Rate: 79  Rhythm: normal sinus rhythm  QRS Axis: left  Intervals: normal  ST/T Wave abnormalities: normal  Conduction Disutrbances:none  Narrative Interpretation:   Old EKG Reviewed: unchanged     I personally performed the services described in this documentation, which was scribed in my presence. The recorded information has been reviewed and considered.         Joya Gaskins, MD 10/24/11 [phone]

## 2011-10-24 NOTE — ED Notes (Signed)
Continues to make grunt noises and hold his abdomen

## 2011-10-24 NOTE — ED Notes (Signed)
Patient c/o abdominal pain since Friday.  States has not had BM since Saturday; denies vomiting.

## 2011-10-24 NOTE — ED Provider Notes (Signed)
History     CSN: [id number]  Arrival date & time 10/24/11  4098   First MD Initiated Contact with Patient 10/24/11 0421      Chief Complaint  Patient presents with  . Abdominal Pain    (Consider location/radiation/quality/duration/timing/severity/associated sxs/prior treatment) HPI Comments: 71 year old male presents with a complaint of abdominal pain which is mid abdominal, constant for the last several days, gradually worsening, not associated with nausea, diarrhea, rashes, coughing, shortness of breath or fevers. He denies history of aneurysm, has not had a bowel movement in 36 hours and was seen recently for abdominal pain with essentially negative workup for any definitive source. Currently he describes the pain as severe. He is known to have a ventral hernia  Patient is a 71 y.o. male presenting with abdominal pain. The history is provided by the patient, the spouse and medical records.  Abdominal Pain The primary symptoms of the illness include abdominal pain.    Past Medical History  Diagnosis Date  . Prostate ca     seed implants  . Arthritis   . Numbness     left leg after back surgery  . Chronic back pain   . GERD (gastroesophageal reflux disease)   . Hx of adenomatous colonic polyps   . Schatzki's ring     History of  . Helicobacter pylori gastritis     Treated  . IBS (irritable bowel syndrome)     Past Surgical History  Procedure Date  . Appendectomy age 60  . Back surgery 02/08    [facility]  . Back surgery 09/24/04    lumbar, [facility]  . Back surgery 09/18/02    [facility]  . Cholecystectomy 03/04/05    [facility], [facility]. Gangrenous cholecystitis complicated by abscess requiring percutaneous drainage. He also had common duct stones requiring ERCP and sphincterotomy.  . Ercp 03/02/05    [facility], [facility]. Normal-appearing biliary tree (gallbladder not image), status post sphincterotomy with recovery of small pieces of stone material, status post balloon occlusion cholangiogram.  .  Cataract extraction w/phaco 11/22/2010    Procedure: CATARACT EXTRACTION PHACO AND INTRAOCULAR LENS PLACEMENT (IOC);  Surgeon: Susa Simmonds;  Location: [facility];  Service: Ophthalmology;  Laterality: Right;  CDE: 6.81  . Esophagogastroduodenoscopy August 2005    Erosive esophagitis and Schatzki ring, small hiatal hernia  . Colonoscopy August 2005    Scattered sigmoid diverticulosis, splenic flexure polyp. Hyperplastic  . Colonoscopy 1997    3 cm tubular adenoma and a sending colon  . Incomplete colonoscopy December 2009    Left-sided diverticula, mid descending colon polyp, due to recurrent looping and redundancy exam was incomplete. It was felt that the mid colon was reached. Followup barium enema showed colon interposition between the liver and the diaphragm, redundant sigmoid colon but no colon mass or polyp identified.. Pathology revealed tubular adenoma.  . Esophagogastroduodenoscopy 01/2008    Mild reflux esophagitis, small hiatal hernia  . Colonoscopy 02/01/2011    Procedure: COLONOSCOPY;  Surgeon: Corbin Ade, MD;  Location: [facility];  Service: Endoscopy;  Laterality: N/A;  1:45  . Esophagogastroduodenoscopy 02/01/2011    Procedure: ESOPHAGOGASTRODUODENOSCOPY (EGD);  Surgeon: Corbin Ade, MD;  Location: [facility];  Service: Endoscopy;  Laterality: N/A;    Family History  Problem Relation Age of Onset  . Hypotension Neg Hx   . Anesthesia problems Neg Hx   . Malignant hyperthermia Neg Hx   . Pseudochol deficiency Neg Hx   . Diabetes Mother   . Coronary  artery disease Father   . Coronary artery disease Mother   . Diabetes Father   . Stomach cancer Father   . Colon cancer Neg Hx     History  Substance Use Topics  . Smoking status: Never Smoker   . Smokeless tobacco: Not on file  . Alcohol Use: No      Review of Systems  Gastrointestinal: Positive for abdominal pain.  All other systems reviewed and are negative.    Allergies  Review of patient's  allergies indicates no known allergies.  Home Medications   Current Outpatient Rx  Name Route Sig Dispense Refill  . HYDROCODONE-ACETAMINOPHEN 5-325 MG PO TABS Oral Take 1 tablet by mouth every 6 (six) hours as needed for pain. 20 tablet 0  . PANTOPRAZOLE SODIUM 20 MG PO TBEC Oral Take 1 tablet (20 mg total) by mouth daily. 30 tablet 1  . POLYETHYLENE GLYCOL 3350 PO POWD  Take 17 g every evening on days that you do not have a bowel movement 527 g 11  . DOCUSATE SODIUM 100 MG PO CAPS Oral Take 1 capsule (100 mg total) by mouth every 12 (twelve) hours. 30 capsule 0  . MAGNESIUM CITRATE PO SOLN Oral Take 296 mLs by mouth once. OTC 300 mL 0    BP 166/83  Pulse 73  Temp 98 F (36.7 C)  Resp 20  Ht 6\' 2"  (1.88 m)  Wt 243 lb (110.224 kg)  BMI 31.20 kg/m2  SpO2 92%  Physical Exam  Nursing note and vitals reviewed. Constitutional: He appears well-developed and well-nourished.       Uncomfortable appearing  HENT:  Head: Normocephalic and atraumatic.  Mouth/Throat: Oropharynx is clear and moist. No oropharyngeal exudate.  Eyes: Conjunctivae and EOM are normal. Pupils are equal, round, and reactive to light. Right eye exhibits no discharge. Left eye exhibits no discharge. No scleral icterus.  Neck: Normal range of motion. Neck supple. No JVD present. No thyromegaly present.  Cardiovascular: Normal rate, regular rhythm, normal heart sounds and intact distal pulses.  Exam reveals no gallop and no friction rub.   No murmur heard. Pulmonary/Chest: Effort normal and breath sounds normal. No respiratory distress. He has no wheezes. He has no rales.  Abdominal: Soft. Bowel sounds are normal. He exhibits no distension and no mass. There is no tenderness.       There is no abdominal tenderness nor is there CVA tenderness on exam, ventral wall hernia present with Valsalva but easily reducible with relaxation of the abdomen  Musculoskeletal: Normal range of motion. He exhibits no edema and no  tenderness.  Lymphadenopathy:    He has no cervical adenopathy.  Neurological: He is alert. Coordination normal.  Skin: Skin is warm and dry. No rash noted. No erythema.  Psychiatric: He has a normal mood and affect. His behavior is normal.    ED Course  Procedures (including critical care time)  Labs Reviewed  CBC WITH DIFFERENTIAL - Abnormal; Notable for the following:    Platelets 127 (*)     All other components within normal limits  COMPREHENSIVE METABOLIC PANEL - Abnormal; Notable for the following:    Glucose, Bld 107 (*)     GFR calc non Af Amer 72 (*)     GFR calc Af Amer 84 (*)     All other components within normal limits  POCT I-STAT, CHEM 8 - Abnormal; Notable for the following:    Glucose, Bld 106 (*)     All other components  within normal limits  LIPASE, BLOOD  URINALYSIS, ROUTINE W REFLEX MICROSCOPIC  LACTIC ACID, PLASMA   Ct Angio Chest Pe W/cm &/or Wo Cm  10/24/2011  *RADIOLOGY REPORT*  Clinical Data:  MID STERNAL CHEST PAIN, left flank and periumbilical abdominal pain, question dissection, no bowel movement for 2 days, history of prostate cancer, GERD, irritable bowel syndrome  CT ANGIOGRAPHY CHEST, ABDOMEN AND PELVIS  Technique:  Multidetector CT imaging through the chest, abdomen and pelvis was performed using the standard protocol during bolus administration of intravenous contrast.  Multiplanar reconstructed images including MIPs were obtained and reviewed to evaluate the vascular anatomy.  Contrast: OMNIPAQUE IOHEXOL 350 MG/ML SOLN No oral contrast administered.  Comparison:  CT abdomen and pelvis 05/12/2010  CTA CHEST  Findings: Scattered atherosclerotic calcifications aorta and coronary arteries. No evidence of aortic aneurysm or dissection. Pulmonary arteries patent. No evidence of pulmonary embolism. No thoracic adenopathy. Subsegmental atelectasis at bases of right middle and right lower lobes. No pulmonary infiltrate, pleural effusion or pneumothorax. No  acute osseous findings.   Review of the MIP images confirms the above findings.  IMPRESSION: Right basilar atelectasis. Scattered atherosclerotic disease changes of the aorta without aneurysm or dissection. No other intrathoracic abnormalities identified.  CTA ABDOMEN AND PELVIS  Findings: Scattered atherosclerotic calcifications of the aorta without aneurysm or dissection. Celiac, SMA, and IMA origins are patent without significant stenoses. Gallbladder surgically absent. 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 Liver, spleen, pancreas, kidneys, and adrenal glands normal appearance. Small duodenal diverticulum. Mild sigmoid diverticulosis without evidence of diverticulitis. Brachytherapy seed implants within prostate bed.  Unremarkable bladder and ureters. Stomach and bowel loops normal appearance. No mass, adenopathy, free fluid or free air. No hernia or acute bony lesion. Prior L2-L5 fusion.   Review of the MIP images confirms the above findings.  IMPRESSION: Mild scattered atherosclerotic changes of the abdominal aorta without aneurysm, dissection, or evidence of mesenteric ischemia. Mild sigmoid diverticulosis without evidence of diverticulitis.   Original Report Authenticated By: Lollie Marrow, M.D.    Dg Abd Acute W/chest  10/22/2011  *RADIOLOGY REPORT*  Clinical Data: Mid abdominal pain for 2 weeks, history prostate cancer  ACUTE ABDOMEN SERIES (ABDOMEN 2 VIEW & CHEST 1 VIEW)  Comparison: Chest radiograph 10/18/2011, abdominal radiograph 02/29/2008  Findings: Normal heart size, mediastinal contours, and pulmonary vascularity. Atherosclerotic calcification aorta. Chronic elevation of right diaphragm with colon interposition between liver and diaphragm. Right basilar atelectasis. Minimal bronchitic changes. No infiltrate or pleural effusion.  Prior lumbar fusion L2-L5. Nonobstructive bowel gas pattern. No bowel dilatation, bowel wall thickening, or definite free intraperitoneal air. No  definite acute osseous findings.  IMPRESSION: Chronic elevation of right diaphragm with right basilar atelectasis and colon interposition. No definite acute abdominal findings.   Original Report Authenticated By: Lollie Marrow, M.D.    Ct Cta Abd/pel W/cm &/or W/o Cm  10/24/2011  *RADIOLOGY REPORT*  Clinical Data:  MID STERNAL CHEST PAIN, left flank and periumbilical abdominal pain, question dissection, no bowel movement for 2 days, history of prostate cancer, GERD, irritable bowel syndrome  CT ANGIOGRAPHY CHEST, ABDOMEN AND PELVIS  Technique:  Multidetector CT imaging through the chest, abdomen and pelvis was performed using the standard protocol during bolus administration of intravenous contrast.  Multiplanar reconstructed images including MIPs were obtained and reviewed to evaluate the vascular anatomy.  Contrast: OMNIPAQUE IOHEXOL 350 MG/ML SOLN No oral contrast administered.  Comparison:  CT abdomen and pelvis 05/12/2010  CTA CHEST  Findings: Scattered atherosclerotic  calcifications aorta and coronary arteries. No evidence of aortic aneurysm or dissection. Pulmonary arteries patent. No evidence of pulmonary embolism. No thoracic adenopathy. Subsegmental atelectasis at bases of right middle and right lower lobes. No pulmonary infiltrate, pleural effusion or pneumothorax. No acute osseous findings.   Review of the MIP images confirms the above findings.  IMPRESSION: Right basilar atelectasis. Scattered atherosclerotic disease changes of the aorta without aneurysm or dissection. No other intrathoracic abnormalities identified.  CTA ABDOMEN AND PELVIS  Findings: Scattered atherosclerotic calcifications of the aorta without aneurysm or dissection. Celiac, SMA, and IMA origins are patent without significant stenoses. Gallbladder surgically absent. 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 Liver, spleen, pancreas, kidneys, and adrenal glands normal appearance. Small duodenal diverticulum. Mild  sigmoid diverticulosis without evidence of diverticulitis. Brachytherapy seed implants within prostate bed.  Unremarkable bladder and ureters. Stomach and bowel loops normal appearance. No mass, adenopathy, free fluid or free air. No hernia or acute bony lesion. Prior L2-L5 fusion.   Review of the MIP images confirms the above findings.  IMPRESSION: Mild scattered atherosclerotic changes of the abdominal aorta without aneurysm, dissection, or evidence of mesenteric ischemia. Mild sigmoid diverticulosis without evidence of diverticulitis.   Original Report Authenticated By: Lollie Marrow, M.D.      1. Abdominal pain       MDM  The patient appears very uncomfortable, he is severely hypertensive at 200/88 and has no abdominal tenderness, will need further workup for source including CT angiogram. I discussed this with the radiologist who is in agreement with this choice of study to evaluate for both dissection and mesenteric ischemia. Labs pending including lactic acid. Aggressive with pain control.  CT scan report reviewed, no acute findings including no signs of mesenteric ischemia, no signs of abdominal aortic dissection or other internal organ injury or abnormality. Vital signs have improved, blood pressure 175/85 on discharge. He has had some improvement with the parenteral pain medication. He feels like he needs to pass gas, will recommend laxative, recommended reducing amounts of opiate medication used in followup in 24 hours for repeat abdominal exam is soft and pain. The patient is in agreement with the plan.        Vida Roller, MD 10/24/11 [phone]

## 2011-10-24 NOTE — ED Notes (Signed)
abd pain since Friday, Seen in ER x2 for same.  D/c from ER this am.  Cont to have pain,  NO n/v

## 2011-10-25 ENCOUNTER — Observation Stay (HOSPITAL_COMMUNITY): Payer: Medicare Other

## 2011-10-25 ENCOUNTER — Encounter (HOSPITAL_COMMUNITY): Payer: Self-pay | Admitting: Urgent Care

## 2011-10-25 DIAGNOSIS — R509 Fever, unspecified: Secondary | ICD-10-CM

## 2011-10-25 DIAGNOSIS — K838 Other specified diseases of biliary tract: Secondary | ICD-10-CM

## 2011-10-25 DIAGNOSIS — K5900 Constipation, unspecified: Secondary | ICD-10-CM

## 2011-10-25 DIAGNOSIS — R1011 Right upper quadrant pain: Secondary | ICD-10-CM

## 2011-10-25 LAB — CBC WITH DIFFERENTIAL/PLATELET
Basophils Absolute: 0 10*3/uL (ref 0.0–0.1)
HCT: 48.9 % (ref 39.0–52.0)
Hemoglobin: 16.1 g/dL (ref 13.0–17.0)
Lymphocytes Relative: 7 % — ABNORMAL LOW (ref 12–46)
Lymphs Abs: 0.7 10*3/uL (ref 0.7–4.0)
Monocytes Absolute: 0.9 10*3/uL (ref 0.1–1.0)
Monocytes Relative: 10 % (ref 3–12)
Neutro Abs: 7.8 10*3/uL — ABNORMAL HIGH (ref 1.7–7.7)
WBC: 9.4 10*3/uL (ref 4.0–10.5)

## 2011-10-25 LAB — HEPATIC FUNCTION PANEL
ALT: 14 U/L (ref 0–53)
Alkaline Phosphatase: 75 U/L (ref 39–117)
Bilirubin, Direct: 0.4 mg/dL — ABNORMAL HIGH (ref 0.0–0.3)
Indirect Bilirubin: 1.4 mg/dL — ABNORMAL HIGH (ref 0.3–0.9)
Total Bilirubin: 1.8 mg/dL — ABNORMAL HIGH (ref 0.3–1.2)

## 2011-10-25 MED ORDER — HYDROMORPHONE HCL PF 1 MG/ML IJ SOLN
1.0000 mg | INTRAMUSCULAR | Status: DC | PRN
  Administered 2011-10-25 – 2011-10-28 (×17): 1 mg via INTRAVENOUS
  Filled 2011-10-25 (×18): qty 1

## 2011-10-25 MED ORDER — DEXTROSE 5 % IV SOLN
1.0000 g | INTRAVENOUS | Status: DC
  Administered 2011-10-25 – 2011-10-28 (×4): 1 g via INTRAVENOUS
  Filled 2011-10-25 (×8): qty 10

## 2011-10-25 MED ORDER — ACETAMINOPHEN 325 MG PO TABS
650.0000 mg | ORAL_TABLET | Freq: Four times a day (QID) | ORAL | Status: DC | PRN
  Administered 2011-10-25 – 2011-10-26 (×5): 650 mg via ORAL
  Filled 2011-10-25 (×5): qty 2

## 2011-10-25 MED ORDER — SODIUM CHLORIDE 0.9 % IJ SOLN
INTRAMUSCULAR | Status: AC
  Administered 2011-10-25: 20 mL
  Filled 2011-10-25: qty 6

## 2011-10-25 MED ORDER — ACETAMINOPHEN 325 MG PO TABS
ORAL_TABLET | ORAL | Status: AC
  Administered 2011-10-25: 650 mg
  Filled 2011-10-25: qty 2

## 2011-10-25 MED ORDER — HYDROMORPHONE HCL PF 1 MG/ML IJ SOLN
2.0000 mg | INTRAMUSCULAR | Status: DC | PRN
  Filled 2011-10-25: qty 1

## 2011-10-25 MED ORDER — IOHEXOL 300 MG/ML  SOLN
100.0000 mL | Freq: Once | INTRAMUSCULAR | Status: AC | PRN
  Administered 2011-10-25: 100 mL via INTRAVENOUS

## 2011-10-25 MED ORDER — HYDROMORPHONE HCL PF 1 MG/ML IJ SOLN
INTRAMUSCULAR | Status: AC
  Administered 2011-10-25: 2 mg
  Filled 2011-10-25: qty 2

## 2011-10-25 MED ORDER — AZITHROMYCIN 250 MG PO TABS
500.0000 mg | ORAL_TABLET | Freq: Every day | ORAL | Status: DC
  Administered 2011-10-25 – 2011-10-28 (×4): 500 mg via ORAL
  Filled 2011-10-25 (×4): qty 2

## 2011-10-25 NOTE — Progress Notes (Signed)
Paged Dr. Lovell Sheehan to make him aware of consult.  Dr. Lovell Sheehan returned call and stated that he had already been contacted by Dr. Renard Matter.

## 2011-10-25 NOTE — Progress Notes (Deleted)
Pt still in room awaiting friend to pick her up. Pt called nurse to room and stated that her ride was 5 mins away. Pt requested to walk downstairs; therefore, walked with pt downstairs to first floor and she will wait outside at main entrance until ride gets here. Pt has all her belongings, dc instructions, and her prescription in hand.  

## 2011-10-25 NOTE — Consult Note (Signed)
Patient seen, chart reviewed.  CT scan of abdomen ordered.  Further management pending those results.

## 2011-10-25 NOTE — Consult Note (Signed)
Referring Provider: Dr. Renard Matter Primary Care Physician:  Alice Reichert, MD Primary Gastroenterologist:  Dr. Jena Gauss  Reason for Consultation:  Abdominal Pain  HPI: JASSIAH VIVIANO is a 71 y.o. male admitted with abdominal pain for past 2 weeks.  2 weeks ago, he was sitting in chair & developed mid-abdominal pain that lasted 2 days.  10/10 at worst.  Pain returned 4 days ago & did not go away.  Pain is constant except for pain meds.  Normal BM 3 days ago.  BM daily before then.  Denies nausea or vomiting.  Pain worse w/ deep breath.  Denies any injury.  Pain radiates to right flank & chest.  Denies dysuria, rectal bleding or melena.  Went to ER 3 times & was sent home to get Mag citrate & drank a bottle without results.  Also used fleets enemea without results.  Since admission he has had an enema without good results just clear liquid return.  14# weight loss per our records in past 9 months.  He is a patient of Dr. Luvenia Starch with hx of erosive reflux esophagitis, IBS, adenomatous colon polyps & chronic constipation.  Denies cough.  He was taking colace, mag citrate, miralax & mineral oil at home.  He does not take norco for pain although he has it for prn back pain.  Takes Protonix daily for GERD.  Recent colonoscopy & EGD as below.  Also had hx gangrenous gallbladder, choledocholithiasis, ERCP with sphincterotomy, stone extraction, cholecystectomy & post-op abscess in 2007.  CTA Chest/A/P shows right basilar atelectasis. Scattered atherosclerotic disease changes of the aorta without aneurysm or dissection. No other intrathoracic abnormalities identified.  Scattered atherosclerotic calcifications of the aorta without aneurysm or dissection. Celiac, SMA, and IMA origins are patent without significant stenoses.  Brachytherapy seed implants within prostate bed.  Mild scattered atherosclerotic changes of the abdominal aorta without aneurysm, dissection, or evidence of mesenteric ischemia. Mild sigmoid diverticulosis  without evidence of diverticulitis.  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 Original Report Authenticated By: Lollie Marrow, M.D.    Past Medical History  Diagnosis Date  . Arthritis   . Numbness     left leg after back surgery  . Chronic back pain   . GERD (gastroesophageal reflux disease)   . Hx of adenomatous colonic polyps   . Schatzki's ring     History of  . Helicobacter pylori gastritis     Treated  . IBS (irritable bowel syndrome)   . Prostate ca     seed implants    Past Surgical History  Procedure Date  . Appendectomy age 66  . Back surgery 02/08    [facility]  . Back surgery 09/24/04    lumbar, [facility]  . Back surgery 09/18/02    [facility]  . Cholecystectomy 03/04/05    [facility], [facility]. Gangrenous cholecystitis complicated by abscess requiring percutaneous drainage. He also had common duct stones requiring ERCP and sphincterotomy.  . Ercp 03/02/05    [facility], [facility]. Normal-appearing biliary tree (gallbladder not image), status post sphincterotomy with recovery of small pieces of stone material, status post balloon occlusion cholangiogram.  . Cataract extraction w/phaco 11/22/2010    Procedure: CATARACT EXTRACTION PHACO AND INTRAOCULAR LENS PLACEMENT (IOC);  Surgeon: Susa Simmonds;  Location: [facility];  Service: Ophthalmology;  Laterality: Right;  CDE: 6.81  . Esophagogastroduodenoscopy August 2005    Erosive esophagitis and Schatzki ring, small hiatal hernia  . Colonoscopy August 2005    Scattered sigmoid diverticulosis, splenic flexure polyp.  Hyperplastic  . Colonoscopy 1997    3 cm tubular adenoma and a sending colon  . Incomplete colonoscopy December 2009    Left-sided diverticula, mid descending colon polyp, due to recurrent looping and redundancy exam was incomplete. It was felt that the mid colon was reached. Followup barium enema showed colon interposition between the liver and the diaphragm, redundant sigmoid colon but no colon mass or polyp identified.. Pathology  revealed tubular adenoma.  . Esophagogastroduodenoscopy 01/2008    Mild reflux esophagitis, small hiatal hernia  . Colonoscopy 02/01/2011    Percival Glasheen-friable anal canal, hyperplastic rectal polyp  . Esophagogastroduodenoscopy 02/01/2011    Xia Stohr-erosive reflux esophagitis, small hiatal hernia    Prior to Admission medications   Medication Sig Start Date End Date Taking? Authorizing Provider  docusate sodium (COLACE) 100 MG capsule Take 1 capsule (100 mg total) by mouth every 12 (twelve) hours. 10/24/11 11/03/11 Yes Vida Roller, MD  HYDROcodone-acetaminophen (NORCO/VICODIN) 5-325 MG per tablet Take 1 tablet by mouth every 6 (six) hours as needed for pain. 10/22/11 11/01/11 Yes Benny Lennert, MD  magnesium citrate solution Take 296 mLs by mouth once. OTC 10/24/11 11/03/11 Yes Vida Roller, MD  mineral oil enema Place 1 enema rectally once.   Yes Historical Provider, MD  pantoprazole (PROTONIX) 20 MG tablet Take 1 tablet (20 mg total) by mouth daily. 10/22/11 10/21/12 Yes Benny Lennert, MD  polyethylene glycol powder (MIRALAX) powder Take 17 g every evening on days that you do not have a bowel movement 01/19/11  Yes Tiffany Kocher, PA    Current Facility-Administered Medications  Medication Dose Route Frequency Provider Last Rate Last Dose  . docusate sodium (COLACE) capsule 100 mg  100 mg Oral BID Alice Reichert, MD   100 mg at 10/24/11 2200  . enoxaparin (LOVENOX) injection 40 mg  40 mg Subcutaneous Q24H Angus Edilia Bo, MD   40 mg at 10/24/11 1815  . HYDROmorphone (DILAUDID) injection 1 mg  1 mg Intravenous Once Joya Gaskins, MD   1 mg at 10/24/11 1558  . HYDROmorphone (DILAUDID) injection 1 mg  1 mg Intravenous Q4H PRN Alice Reichert, MD   1 mg at 10/25/11 0427  . mineral oil enema 1 enema  1 enema Rectal Once Angus Edilia Bo, MD      . ondansetron [facility]) injection 4 mg  4 mg Intravenous Once Joya Gaskins, MD   4 mg at 10/24/11 1558  . pantoprazole (PROTONIX) EC tablet 40 mg  40  mg Oral Daily Alice Reichert, MD   40 mg at 10/24/11 1818    Allergies as of 10/24/2011  . (No Known Allergies)    Family History  Problem Relation Age of Onset  . Hypotension Neg Hx   . Anesthesia problems Neg Hx   . Malignant hyperthermia Neg Hx   . Pseudochol deficiency Neg Hx   . Diabetes Mother   . Coronary artery disease Father   . Coronary artery disease Mother   . Diabetes Father   . Stomach cancer Father   . Colon cancer Neg Hx     History   Social History  . Marital Status: Married    Spouse Name: N/A    Number of Children: 3  . Years of Education: N/A   Occupational History  . Retired IT sales professional    Social History Main Topics  . Smoking status: Never Smoker   . Smokeless tobacco: Not on file  . Alcohol Use: No  .  Drug Use: No  . Sexually Active:    Review of Systems: Gen: c/o weakness, chills CV: Denies chest pain, angina, palpitations, syncope, orthopnea, PND, peripheral edema, and claudication. Resp: Denies dyspnea at rest, dyspnea with exercise, cough, sputum, wheezing, coughing up blood, and pleurisy. GI: Denies vomiting blood, jaundice, and fecal incontinence.   Denies dysphagia or odynophagia. GU : Denies urinary burning, blood in urine, urinary frequency, urinary hesitancy, nocturnal urination, and urinary incontinence. MS: chronic back pain but not severe enough to take his narcotics on a regular basis.   Derm: Denies rash, itching, dry skin, hives, moles, warts, or unhealing ulcers.  Psych: Denies depression, anxiety, memory loss, suicidal ideation, hallucinations, paranoia, and confusion. Heme: Denies bruising, bleeding, and enlarged lymph nodes. Neuro:  Denies any headaches, dizziness, paresthesias. Endo:  Denies any problems with DM, thyroid, adrenal function.  Physical Exam: Vital signs in last 24 hours: Temp:  [98 F (36.7 C)-100 F (37.8 C)] 98 F (36.7 C) (09/03 0730) RN asked to recheck temp @8 :56 AM 99.9 Oral Pulse Rate:   [86-93] 88  (09/03 0400) Resp:  [20-24] 20  (09/03 0400) BP: (172-186)/(83-100) 172/94 mmHg (09/03 0400) SpO2:  [89 %-96 %] 92 % (09/03 0400) Weight:  [240 lb 8.4 oz (109.1 kg)-245 lb (111.131 kg)] 241 lb 14.4 oz (109.725 kg) (09/03 0400) Last BM Date: 10/22/11 No LMP for male patient. General:   Alert,  Well-developed, well-nourished, pleasant and cooperative in NAD Head:  Normocephalic and atraumatic. Eyes:  Sclera clear, no icterus.   Conjunctiva pink. Ears:  Normal auditory acuity. Nose:  No deformity, discharge, or lesions. Mouth:  No deformity or lesions,oropharynx pink & moist. Neck:  Supple; no masses or thyromegaly. Lungs:  +right base with rhonchi & few crackes.  NAD. Heart:  Regular rate and rhythm; no murmurs, clicks, rubs,  or gallops. Abdomen:  Normal bowel sounds.  No bruits.  + Right upper quadrant tenderness along right costal margins.  Soft, non-distended without masses, hepatosplenomegaly or hernias noted.  No guarding or rebound tenderness.   Rectal:  No internal/external lesions visualized or palpated.  No distal impaction.  +prostatamegaly. Msk:  Symmetrical without gross deformities. Normal posture. Pulses:  Normal pulses noted. Extremities:  No clubbing or edema. Neurologic:  Alert and oriented x4;  grossly normal neurologically. Skin:  Intact without significant lesions or rashes. Lymph Nodes:  No significant cervical adenopathy. Psych:  Alert and cooperative. Normal mood and affect.  Lab Results:  Basename 2011-10-30 1656 10/30/11 0453 10-30-2011 0443 10/22/11 1851  WBC 6.8 6.5 -- 6.2  HGB 16.3 16.1 16.3 --  HCT 47.1 46.0 48.0 --  PLT 113* 127* -- 141*   BMET  Basename October 30, 2011 1656 10/30/11 0453 10/30/11 0443 10/22/11 1851  NA 136 138 140 --  K 4.1 3.6 3.6 --  CL 96 99 100 --  CO2 34* 31 -- 29  GLUCOSE 128* 107* 106* --  BUN 7 8 7  --  CREATININE 0.92 1.01 1.10 --  CALCIUM 9.7 9.6 -- 9.6   LFT  Basename October 30, 2011 0453 10/22/11 1851  PROT 7.5 7.3    ALBUMIN 3.8 3.9  AST 20 24  ALT 15 17  ALKPHOS 71 69  BILITOT 1.1 0.8  BILIDIR -- --  IBILI -- --  LIPASE 22 25  AMYLASE -- --   Studies/Results: Dg Abd 1 View   10/30/11  *RADIOLOGY REPORT*  Clinical Data: 71 year old male with abdominal pain.  ABDOMEN - 1 VIEW  Comparison: CTA chest abdomen pelvis from  the same day and earlier.  Findings: Lumbar hardware.  Sequelae of prostate brachytherapy. Nonobstructed visualized bowel gas pattern. No acute osseous abnormality identified.  IMPRESSION: Visualized bowel gas pattern is nonobstructed.   Original Report Authenticated By: Harley Hallmark, M.D.    Impression: NILSON TABORA is a pleasant 71 y.o. male admitted with abdominal pain.  Hx of multiple GI issues outlined above, however he had been doing very well in his usual state of health until 2 weeks ago.  He also has had constipation.  He has become febrile this morning & given pain with inspiration & significant right sided pain along with findings on CTA, I would question development of early pneumonia.  Early diverticulitis not picked up on CTA remains in the differential as well.  ? Significance of colon interposition between liver and right diaphragm as source of abdominal pain.  Will discuss w/ Dr Jena Gauss.  Plan: 1. CXR now 2. CBC 3. Clear liquids 4. Continue supportive measures 5. Start antibiotics pending CXR  Attempted to call Dr Renard Matter.  Unavailable at this time.     LOS: 1 day   Lorenza Burton  10/25/2011, 8:11 AM [facility]  10:59 AM CXR stable, no PNA.  ?etiology of fever.  LFTs added to blood already in lab.  If elevated, MRCP.  Above has been discussed w/ Dr Jena Gauss.  3:04 PM  Discussed w/ Dr Renard Matter.  Pt has been started on antibiotics.  Suggested surgical consult & judicious miralax.  Yetta Barre, KANDICE    Pt seen and examined; Discussed with Dr. Lovell Sheehan; additional cross-sectional imaging pending. Latest Lfts consistent with a Gilberts picture.  Abx for early pneumonia. ? Ventral hernia contributing to picture. Doubt biliary etiology. Colonic interposition between liver and diaphram (Chilaiditi's sign) usually asymptomatic  - likely the case here as well. Further recommendations to follow.

## 2011-10-25 NOTE — Progress Notes (Signed)
Subjective: The patient is feeling some better today. He was admitted with severe mid abdominal pain. He had made several trips to the ED for evaluation of this but continued to have problems. His bowels have not moved for approximately 4 days. He did have enema last evening with minimal results. He does have a known ventral hernia. The pain seems to be in this area. The hernia is easily reducible. But he continues to have pain and requires drugs intravenously for relief.  Objective: Vital signs in last 24 hours: Temp:  [98.1 F (36.7 C)-100 F (37.8 C)] 100 F (37.8 C) (09/03 0400) Pulse Rate:  [73-93] 88  (09/03 0400) Resp:  [20-24] 20  (09/03 0400) BP: (166-186)/(83-100) 172/94 mmHg (09/03 0400) SpO2:  [89 %-96 %] 92 % (09/03 0400) Weight:  [109.1 kg (240 lb 8.4 oz)-111.131 kg (245 lb)] 109.725 kg (241 lb 14.4 oz) (09/03 0400) Weight change:  Last BM Date: 10/22/11  Intake/Output from previous day:   Intake/Output this shift:    Examination   the patient is alert. Vital signs are stable. HEENT negative neck supple no JVD or thyroid abnormalities. Lungs clear to P&A. Heart regular rhythm. Abdomen the patient has ventral hernia in the midabdomen which is tender. Extremities are free of edema  Lab Results:  Basename 10/24/11 1656 10/24/11 0453  WBC 6.8 6.5  HGB 16.3 16.1  HCT 47.1 46.0  PLT 113* 127*   BMET  Basename 10/24/11 1656 10/24/11 0453  NA 136 138  K 4.1 3.6  CL 96 99  CO2 34* 31  GLUCOSE 128* 107*  BUN 7 8  CREATININE 0.92 1.01  CALCIUM 9.7 9.6    Studies/Results: Dg Abd 1 View  10/24/2011  *RADIOLOGY REPORT*  Clinical Data: 71 year old male with abdominal pain.  ABDOMEN - 1 VIEW  Comparison: CTA chest abdomen pelvis from the same day and earlier.  Findings: Lumbar hardware.  Sequelae of prostate brachytherapy. Nonobstructed visualized bowel gas pattern. No acute osseous abnormality identified.  IMPRESSION: Visualized bowel gas pattern is nonobstructed.    Original Report Authenticated By: Harley Hallmark, M.D.    Ct Angio Chest Pe W/cm &/or Wo Cm  10/24/2011  *RADIOLOGY REPORT*  Clinical Data:  MID STERNAL CHEST PAIN, left flank and periumbilical abdominal pain, question dissection, no bowel movement for 2 days, history of prostate cancer, GERD, irritable bowel syndrome  CT ANGIOGRAPHY CHEST, ABDOMEN AND PELVIS  Technique:  Multidetector CT imaging through the chest, abdomen and pelvis was performed using the standard protocol during bolus administration of intravenous contrast.  Multiplanar reconstructed images including MIPs were obtained and reviewed to evaluate the vascular anatomy.  Contrast: OMNIPAQUE IOHEXOL 350 MG/ML SOLN No oral contrast administered.  Comparison:  CT abdomen and pelvis 05/12/2010  CTA CHEST  Findings: Scattered atherosclerotic calcifications aorta and coronary arteries. No evidence of aortic aneurysm or dissection. Pulmonary arteries patent. No evidence of pulmonary embolism. No thoracic adenopathy. Subsegmental atelectasis at bases of right middle and right lower lobes. No pulmonary infiltrate, pleural effusion or pneumothorax. No acute osseous findings.   Review of the MIP images confirms the above findings.  IMPRESSION: Right basilar atelectasis. Scattered atherosclerotic disease changes of the aorta without aneurysm or dissection. No other intrathoracic abnormalities identified.  CTA ABDOMEN AND PELVIS  Findings: Scattered atherosclerotic calcifications of the aorta without aneurysm or dissection. Celiac, SMA, and IMA origins are patent without significant stenoses. Gallbladder surgically absent. 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 Liver, spleen, pancreas, kidneys, and adrenal  glands normal appearance. Small duodenal diverticulum. Mild sigmoid diverticulosis without evidence of diverticulitis. Brachytherapy seed implants within prostate bed.  Unremarkable bladder and ureters. Stomach and bowel loops normal  appearance. No mass, adenopathy, free fluid or free air. No hernia or acute bony lesion. Prior L2-L5 fusion.   Review of the MIP images confirms the above findings.  IMPRESSION: Mild scattered atherosclerotic changes of the abdominal aorta without aneurysm, dissection, or evidence of mesenteric ischemia. Mild sigmoid diverticulosis without evidence of diverticulitis.   Original Report Authenticated By: Lollie Marrow, M.D.    Ct Cta Abd/pel W/cm &/or W/o Cm  10/24/2011  *RADIOLOGY REPORT*  Clinical Data:  MID STERNAL CHEST PAIN, left flank and periumbilical abdominal pain, question dissection, no bowel movement for 2 days, history of prostate cancer, GERD, irritable bowel syndrome  CT ANGIOGRAPHY CHEST, ABDOMEN AND PELVIS  Technique:  Multidetector CT imaging through the chest, abdomen and pelvis was performed using the standard protocol during bolus administration of intravenous contrast.  Multiplanar reconstructed images including MIPs were obtained and reviewed to evaluate the vascular anatomy.  Contrast: OMNIPAQUE IOHEXOL 350 MG/ML SOLN No oral contrast administered.  Comparison:  CT abdomen and pelvis 05/12/2010  CTA CHEST  Findings: Scattered atherosclerotic calcifications aorta and coronary arteries. No evidence of aortic aneurysm or dissection. Pulmonary arteries patent. No evidence of pulmonary embolism. No thoracic adenopathy. Subsegmental atelectasis at bases of right middle and right lower lobes. No pulmonary infiltrate, pleural effusion or pneumothorax. No acute osseous findings.   Review of the MIP images confirms the above findings.  IMPRESSION: Right basilar atelectasis. Scattered atherosclerotic disease changes of the aorta without aneurysm or dissection. No other intrathoracic abnormalities identified.  CTA ABDOMEN AND PELVIS  Findings: Scattered atherosclerotic calcifications of the aorta without aneurysm or dissection. Celiac, SMA, and IMA origins are patent without significant stenoses.  Gallbladder surgically absent. 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 Liver, spleen, pancreas, kidneys, and adrenal glands normal appearance. Small duodenal diverticulum. Mild sigmoid diverticulosis without evidence of diverticulitis. Brachytherapy seed implants within prostate bed.  Unremarkable bladder and ureters. Stomach and bowel loops normal appearance. No mass, adenopathy, free fluid or free air. No hernia or acute bony lesion. Prior L2-L5 fusion.   Review of the MIP images confirms the above findings.  IMPRESSION: Mild scattered atherosclerotic changes of the abdominal aorta without aneurysm, dissection, or evidence of mesenteric ischemia. Mild sigmoid diverticulosis without evidence of diverticulitis.   Original Report Authenticated By: Lollie Marrow, M.D.     Medications reviewed  Assessment/Plan: 1. Patient was admitted with severe mid abdominal pain. He does have a ventral hernia is easily reducible. 2 patient has had obstipation for several days 3. Patient states he has mild sigmoid diverticulosis without evidence of diverticulitis or other significant abnormalities. Plan to continue IV pain medication. We will obtain consultation from  GI service.                    10/25/2011, 5:42 AM

## 2011-10-25 NOTE — Progress Notes (Signed)
Soap suds enema given.  Pt was able to hold enema fluid in rectum for about 2-3 mins.  When pt release enema fluid it has a brownish color but no solid stool was passed and pt denies passing any gas.

## 2011-10-25 NOTE — Progress Notes (Signed)
UR Chart Review Completed  

## 2011-10-26 ENCOUNTER — Encounter (HOSPITAL_COMMUNITY)
Admission: EM | Disposition: A | Discharge status: Home / Self Care | Payer: Self-pay | Source: Home / Self Care | Attending: Family Medicine

## 2011-10-26 ENCOUNTER — Encounter (HOSPITAL_COMMUNITY): Payer: Self-pay | Admitting: *Deleted

## 2011-10-26 DIAGNOSIS — R933 Abnormal findings on diagnostic imaging of other parts of digestive tract: Secondary | ICD-10-CM

## 2011-10-26 DIAGNOSIS — R109 Unspecified abdominal pain: Secondary | ICD-10-CM

## 2011-10-26 DIAGNOSIS — K449 Diaphragmatic hernia without obstruction or gangrene: Secondary | ICD-10-CM

## 2011-10-26 DIAGNOSIS — R1011 Right upper quadrant pain: Secondary | ICD-10-CM

## 2011-10-26 HISTORY — PX: ESOPHAGOGASTRODUODENOSCOPY: SHX5428

## 2011-10-26 SURGERY — EGD (ESOPHAGOGASTRODUODENOSCOPY)
Anesthesia: Moderate Sedation

## 2011-10-26 MED ORDER — SODIUM CHLORIDE 0.9 % IV SOLN: INTRAVENOUS | Status: DC

## 2011-10-26 MED ORDER — MIDAZOLAM HCL 5 MG/5ML IJ SOLN
INTRAMUSCULAR | Status: AC
  Filled 2011-10-26: qty 10

## 2011-10-26 MED ORDER — MEPERIDINE HCL 100 MG/ML IJ SOLN
INTRAMUSCULAR | Status: DC | PRN
  Administered 2011-10-26: 25 mg via INTRAVENOUS

## 2011-10-26 MED ORDER — PANTOPRAZOLE SODIUM 40 MG PO TBEC
40.0000 mg | DELAYED_RELEASE_TABLET | Freq: Two times a day (BID) | ORAL | Status: DC
  Administered 2011-10-26 – 2011-10-29 (×6): 40 mg via ORAL
  Filled 2011-10-26 (×6): qty 1

## 2011-10-26 MED ORDER — MEPERIDINE HCL 100 MG/ML IJ SOLN
INTRAMUSCULAR | Status: AC
Start: 2011-10-26 — End: 2011-10-26
  Filled 2011-10-26: qty 1

## 2011-10-26 MED ORDER — STERILE WATER FOR IRRIGATION IR SOLN
Status: DC | PRN
  Administered 2011-10-26: 14:00:00

## 2011-10-26 MED ORDER — SIMETHICONE 80 MG PO CHEW
160.0000 mg | CHEWABLE_TABLET | ORAL | Status: DC | PRN
  Administered 2011-10-26 – 2011-10-27 (×3): 160 mg via ORAL
  Filled 2011-10-26 (×3): qty 2

## 2011-10-26 MED ORDER — BUTAMBEN-TETRACAINE-BENZOCAINE 2-2-14 % EX AERO
INHALATION_SPRAY | CUTANEOUS | Status: DC | PRN
  Administered 2011-10-26: 2 via TOPICAL

## 2011-10-26 MED ORDER — SUCRALFATE 1 GM/10ML PO SUSP
1.0000 g | Freq: Three times a day (TID) | ORAL | Status: DC
  Administered 2011-10-26 – 2011-10-29 (×11): 1 g via ORAL
  Filled 2011-10-26 (×12): qty 10

## 2011-10-26 MED ORDER — MIDAZOLAM HCL 5 MG/5ML IJ SOLN
INTRAMUSCULAR | Status: DC | PRN
  Administered 2011-10-26: 1 mg via INTRAVENOUS

## 2011-10-26 NOTE — Op Note (Signed)
[facility] [address] [place], [address]   ENDOSCOPY PROCEDURE REPORT  PATIENT: [name], Walter  MR#: [id number] BIRTHDATE: 17-Sep-1940 , 71  yrs. old GENDER: Male ENDOSCOPIST: R.  Roetta Sessions, MD FACP FACG REFERRED BY:  Butch Penny, M.D. PROCEDURE DATE:  10/26/2011 PROCEDURE:     EGD with gastric and duodenal biopsy  INDICATIONS:     right-sided abdominal pain with abnormal duodenum on CT suggestive of peptic ulcers  INFORMED CONSENT:   The risks, benefits, limitations, alternatives and imponderables have been discussed.  The potential for biopsy, esophogeal dilation, etc. have also been reviewed.  Questions have been answered.  All parties agreeable.  Please see the history and physical in the medical record for more information.  MEDICATIONS:  Versed 1 mg IV and Demerol 25 anxiety. Cetacaine spray to  DESCRIPTION OF PROCEDURE:   The EG-[id number] ([id number])  endoscope was introduced through the mouth and advanced to the second portion of the duodenum without difficulty or limitations.  The mucosal surfaces were surveyed very carefully during advancement of the scope and upon withdrawal.  Retroflexion view of the proximal stomach and esophagogastric junction was performed.      FINDINGS: Normal esophagus. Stomach empty. Small hiatal hernia. Diffuse gastric submucosal petechia. No ulcer or infiltrating process observed. Pylorus patent. Examination of bulb,  second and third portion revealed a juxta ampullary duodenal diverticulum and some mucosal edema involving the first and second portion of the duodenum. Some superficial erosions were present. I did not see an infiltrating process. Did not see any frank peptic ulcer disease. Lumen widely patent through this segment.  THERAPEUTIC / DIAGNOSTIC MANEUVERS PERFORMED:  biopsies of the abnormal gastric mucosa and duodenal mucosa taken for histologic study   COMPLICATIONS:  None  IMPRESSION:  Small  hiatal hernia. Abnormal gastric mucosa of doubtful clinical significance-status post biopsy. Subtle changes of the duodenal mucosa-  not nearly as impressive as suggested on CT scan-status post biopsy  RECOMMENDATIONS:  Low residue diet. Add Carafate to his regimen. Followup on pathology.    _______________________________ R. Roetta Sessions, MD FACP [facility] eSigned:  R. Roetta Sessions, MD FACP [facility] 10/26/2011 3:21 PM     CC:

## 2011-10-26 NOTE — Consult Note (Signed)
Reason for Consult: Possible ventral hernia, upper abdominal pain Referring Physician: Dr. Loann Quill is an 71 y.o. male.  HPI: Patient is a 71 year old white male who is present in the emergency room several times over the past several weeks for epigastric abdominal pain. He was then the hospital for further evaluation treatment. On physical exam, he is noted to have a diastases with possible epigastric ventral herniation. A CT scan of the abdomen was done yesterday evening which revealed no specific ventral hernia, but possible evolving duodenitis/peptic ulcer disease. Patient denies any significant tenderness today.  Past Medical History  Diagnosis Date  . Arthritis   . Numbness     left leg after back surgery  . Chronic back pain   . GERD (gastroesophageal reflux disease)   . Hx of adenomatous colonic polyps   . Schatzki's ring     History of  . Helicobacter pylori gastritis     Treated  . IBS (irritable bowel syndrome)   . Prostate ca     seed implants    Past Surgical History  Procedure Date  . Appendectomy age 59  . Back surgery 02/08    [facility]  . Back surgery 09/24/04    lumbar, [facility]  . Back surgery 09/18/02    [facility]  . Cholecystectomy 03/04/05    [facility], Estephany Perot. Gangrenous cholecystitis complicated by abscess requiring percutaneous drainage. He also had common duct stones requiring ERCP and sphincterotomy.  . Ercp 03/02/05    [facility], Rourk. Normal-appearing biliary tree (gallbladder not image), status post sphincterotomy with recovery of small pieces of stone material, status post balloon occlusion cholangiogram.  . Cataract extraction w/phaco 11/22/2010    Procedure: CATARACT EXTRACTION PHACO AND INTRAOCULAR LENS PLACEMENT (IOC);  Surgeon: Susa Simmonds;  Location: [facility];  Service: Ophthalmology;  Laterality: Right;  CDE: 6.81  . Esophagogastroduodenoscopy August 2005    Erosive esophagitis and Schatzki ring, small hiatal hernia  . Colonoscopy August 2005   Scattered sigmoid diverticulosis, splenic flexure polyp. Hyperplastic  . Colonoscopy 1997    3 cm tubular adenoma and a sending colon  . Incomplete colonoscopy December 2009    Left-sided diverticula, mid descending colon polyp, due to recurrent looping and redundancy exam was incomplete. It was felt that the mid colon was reached. Followup barium enema showed colon interposition between the liver and the diaphragm, redundant sigmoid colon but no colon mass or polyp identified.. Pathology revealed tubular adenoma.  . Esophagogastroduodenoscopy 01/2008    Mild reflux esophagitis, small hiatal hernia  . Colonoscopy 02/01/2011    Rourk-friable anal canal, hyperplastic rectal polyp  . Esophagogastroduodenoscopy 02/01/2011    Rourk-erosive reflux esophagitis, small hiatal hernia    Family History  Problem Relation Age of Onset  . Hypotension Neg Hx   . Anesthesia problems Neg Hx   . Malignant hyperthermia Neg Hx   . Pseudochol deficiency Neg Hx   . Diabetes Mother   . Coronary artery disease Father   . Coronary artery disease Mother   . Diabetes Father   . Stomach cancer Father   . Colon cancer Neg Hx   . Deep vein thrombosis Daughter     Social History:  reports that he has never smoked. He does not have any smokeless tobacco history on file. He reports that he does not drink alcohol or use illicit drugs.  Allergies: No Known Allergies  Medications: I have reviewed the patient's current medications.  Results for orders placed during the hospital encounter of  10/24/11 (from the past 48 hour(s))  CBC WITH DIFFERENTIAL     Status: Abnormal   Collection Time   10/24/11  4:56 PM      Component Value Range Comment   WBC 6.8  4.0 - 10.5 K/uL    RBC 5.23  4.22 - 5.81 MIL/uL    Hemoglobin 16.3  13.0 - 17.0 g/dL    HCT 16.1  09.6 - 04.5 %    MCV 90.1  78.0 - 100.0 fL    MCH 31.2  26.0 - 34.0 pg    MCHC 34.6  30.0 - 36.0 g/dL    RDW 40.9  81.1 - 91.4 %    Platelets 113 (*) 150 - 400  K/uL    Neutrophils Relative 81 (*) 43 - 77 %    Neutro Abs 5.6  1.7 - 7.7 K/uL    Lymphocytes Relative 9 (*) 12 - 46 %    Lymphs Abs 0.6 (*) 0.7 - 4.0 K/uL    Monocytes Relative 10  3 - 12 %    Monocytes Absolute 0.7  0.1 - 1.0 K/uL    Eosinophils Relative 0  0 - 5 %    Eosinophils Absolute 0.0  0.0 - 0.7 K/uL    Basophils Relative 0  0 - 1 %    Basophils Absolute 0.0  0.0 - 0.1 K/uL   BASIC METABOLIC PANEL     Status: Abnormal   Collection Time   10/24/11  4:56 PM      Component Value Range Comment   Sodium 136  135 - 145 mEq/L    Potassium 4.1  3.5 - 5.1 mEq/L    Chloride 96  96 - 112 mEq/L    CO2 34 (*) 19 - 32 mEq/L    Glucose, Bld 128 (*) 70 - 99 mg/dL    BUN 7  6 - 23 mg/dL    Creatinine, Ser 7.82  0.50 - 1.35 mg/dL    Calcium 9.7  8.4 - 95.6 mg/dL    GFR calc non Af Amer 82 (*) >90 mL/min    GFR calc Af Amer >90  >90 mL/min   MRSA PCR SCREENING     Status: Normal   Collection Time   10/24/11  5:51 PM      Component Value Range Comment   MRSA by PCR NEGATIVE  NEGATIVE   CBC WITH DIFFERENTIAL     Status: Abnormal   Collection Time   10/25/11  9:47 AM      Component Value Range Comment   WBC 9.4  4.0 - 10.5 K/uL    RBC 5.38  4.22 - 5.81 MIL/uL    Hemoglobin 16.1  13.0 - 17.0 g/dL    HCT 21.3  08.6 - 57.8 %    MCV 90.9  78.0 - 100.0 fL    MCH 29.9  26.0 - 34.0 pg    MCHC 32.9  30.0 - 36.0 g/dL    RDW 46.9  62.9 - 52.8 %    Platelets 131 (*) 150 - 400 K/uL    Neutrophils Relative 83 (*) 43 - 77 %    Neutro Abs 7.8 (*) 1.7 - 7.7 K/uL    Lymphocytes Relative 7 (*) 12 - 46 %    Lymphs Abs 0.7  0.7 - 4.0 K/uL    Monocytes Relative 10  3 - 12 %    Monocytes Absolute 0.9  0.1 - 1.0 K/uL    Eosinophils Relative 0  0 -  5 %    Eosinophils Absolute 0.0  0.0 - 0.7 K/uL    Basophils Relative 0  0 - 1 %    Basophils Absolute 0.0  0.0 - 0.1 K/uL   HEPATIC FUNCTION PANEL     Status: Abnormal   Collection Time   10/25/11  9:47 AM      Component Value Range Comment   Total Protein  7.9  6.0 - 8.3 g/dL    Albumin 3.7  3.5 - 5.2 g/dL    AST 18  0 - 37 U/L    ALT 14  0 - 53 U/L    Alkaline Phosphatase 75  39 - 117 U/L    Total Bilirubin 1.8 (*) 0.3 - 1.2 mg/dL    Bilirubin, Direct 0.4 (*) 0.0 - 0.3 mg/dL    Indirect Bilirubin 1.4 (*) 0.3 - 0.9 mg/dL     Dg Chest 2 View  05/26/4096  *RADIOLOGY REPORT*  Clinical Data: Fever.  CHEST - 2 VIEW  Comparison: 10/18/2011 and 11/13/2007 and a CT scan of the chest dated 10/24/2011  Findings: The heart size and pulmonary vascularity are normal.  No effusions.  Chronic elevation of the right hemidiaphragm with slight scarring in the right lung.  No acute osseous abnormality.  IMPRESSION: No acute abnormality.  No significant change since 11/13/2007.   Original Report Authenticated By: Gwynn Burly, M.D.    Dg Abd 1 View  10/24/2011  *RADIOLOGY REPORT*  Clinical Data: 71 year old male with abdominal pain.  ABDOMEN - 1 VIEW  Comparison: CTA chest abdomen pelvis from the same day and earlier.  Findings: Lumbar hardware.  Sequelae of prostate brachytherapy. Nonobstructed visualized bowel gas pattern. No acute osseous abnormality identified.  IMPRESSION: Visualized bowel gas pattern is nonobstructed.   Original Report Authenticated By: Harley Hallmark, M.D.    Ct Abd Wo & W Cm  10/25/2011  *RADIOLOGY REPORT*  Clinical Data: Epigastric pain and fever.  Possible ventral hernia. History of prostate cancer and seed implantation. History of appendectomy, cholecystectomy and lumbar fusion.  CT ABDOMEN WITHOUT AND WITH CONTRAST  Technique:  Multidetector CT imaging of the abdomen was performed following the standard protocol before and during bolus administration of intravenous contrast.  Contrast: OMNIPAQUE IOHEXOL 300 MG/ML  SOLN  Comparison: Abdominal CTA 10/24/2011 and abdominal CT 05/12/2010.  Findings: There is stable atelectasis at the right lung base associated with elevation of the right hemidiaphragm.  There is no significant pleural  effusion.  There are postsurgical changes status post cholecystectomy. There is a 1.8 cm fluid collection in the cholecystectomy bed adjacent to the surgical clips which was not present in 2012.  There is surrounding soft tissue stranding with a poor fat plane between the cholecystectomy bed and the adjacent duodenum.  A diverticulum of the second portion of the duodenum more inferiorly appears stable without surrounding inflammation.  There is no other extraluminal fluid collection.  There is no intrahepatic biliary dilatation or pneumobilia.  The liver demonstrates steatosis without focal abnormality.  The spleen, pancreas, adrenal glands and kidneys appear normal.  There is colonic interposition.  There is fluid throughout the colon.  There is no bowel wall thickening or distension.  Aorto iliac atherosclerosis appears stable.  Postsurgical changes of the lumbar spine appear stable status post laminectomy and fusion.  The pelvis was not imaged.  IMPRESSION:  1.  Developing right upper quadrant inflammatory process within the cholecystectomy bed with a small fluid collection and soft tissue stranding surrounding  the proximal duodenum.  Findings likely represent peptic ulcer disease or duodenitis.  A primary biliary process is considered less likely in this patient who has not undergone recent cholecystectomy. 2.  No evidence of bowel obstruction or other fluid collection. 3.  Fluid filled colon.   Original Report Authenticated By: Gerrianne Scale, M.D.     ROS: See chart Blood pressure 131/72, pulse 89, temperature 97.5 F (36.4 C), temperature source Oral, resp. rate 20, height 6\' 2"  (1.88 m), weight 109.7 kg (241 lb 13.5 oz), SpO2 93.00%. Physical Exam: Well-developed well-nourished white male in no acute distress. Abdomen: The patient does have a significant diastases with remarkable attenuation of the midline fascia in the epigastric region. The defect is large and does not have any bowel contents  present. The abdomen is soft.  Assessment/Plan: Impression: Diastases with possible attenuation of the epigastric ventral fascia. I do not think this is the source of his pain. Dr. Jena Gauss has been made aware of the recent CT findings which were new as compared to a previous CT scan the abdomen done 2 days earlier appear. He may be a candidate for a ventral herniorrhaphy in the future, though I would wait until this other process has been fully worked up. Will continue to follow with you peripherally.  Jonanthony Nahar A 10/26/2011, 1:06 PM

## 2011-10-26 NOTE — Progress Notes (Signed)
Subjective: BM yesterday, felt productive. With coughing, notes pain on right side. Pain "about the same". No appetite. Denies N/V. Some confusion of year with pain medicine.   Objective: Vital signs in last 24 hours: Temp:  [97.7 F (36.5 C)-102.2 F (39 C)] 99.2 F (37.3 C) (09/04 0759) Pulse Rate:  [85-103] 89  (09/04 0759) Resp:  [20-24] 20  (09/04 0759) BP: (116-189)/(72-83) 131/72 mmHg (09/04 0759) SpO2:  [85 %-95 %] 93 % (09/04 0759) Weight:  [241 lb 13.5 oz (109.7 kg)] 241 lb 13.5 oz (109.7 kg) (09/04 0500) Last BM Date: 10/25/11 General:   Alert and oriented to person, place, situation. Unsure year (states 2014, reorients appropriately). Head:  Normocephalic and atraumatic. Eyes:  No icterus, sclera clear. Conjuctiva pink.  Mouth:  Without lesions, mucosa pink and moist.  Lungs: Clear to auscultation bilaterally, without wheezing, rales, or rhonchi.  Abdomen:  Bowel sounds present, soft, TTP upper abdomen and RUQ, non-distended.  No rebound or guarding.  Msk:  Symmetrical without gross deformities. Normal posture. Extremities:  Without clubbing or edema. Skin:  Warm and dry, intact without significant lesions.  Psych:  Alert and cooperative. Normal mood and affect.  Intake/Output from previous day: 09/03 0701 - 09/04 0700 In: 1610 [P.O.:1560; IV Piggyback:50] Out: -  Intake/Output this shift:    Lab Results:  Basename 10/25/11 0947 10/24/11 1656 10/24/11 0453  WBC 9.4 6.8 6.5  HGB 16.1 16.3 16.1  HCT 48.9 47.1 46.0  PLT 131* 113* 127*   BMET  Basename 10/24/11 1656 10/24/11 0453 10/24/11 0443  NA 136 138 140  K 4.1 3.6 3.6  CL 96 99 100  CO2 34* 31 --  GLUCOSE 128* 107* 106*  BUN 7 8 7   CREATININE 0.92 1.01 1.10  CALCIUM 9.7 9.6 --   LFT  Basename 10/25/11 0947 10/24/11 0453  PROT 7.9 7.5  ALBUMIN 3.7 3.8  AST 18 20  ALT 14 15  ALKPHOS 75 71  BILITOT 1.8* 1.1  BILIDIR 0.4* --  IBILI 1.4* --     Studies/Results: Dg Chest 2 View  10/25/2011   *RADIOLOGY REPORT*  Clinical Data: Fever.  CHEST - 2 VIEW  Comparison: 10/18/2011 and 11/13/2007 and a CT scan of the chest dated 10/24/2011  Findings: The heart size and pulmonary vascularity are normal.  No effusions.  Chronic elevation of the right hemidiaphragm with slight scarring in the right lung.  No acute osseous abnormality.  IMPRESSION: No acute abnormality.  No significant change since 11/13/2007.   Original Report Authenticated By: Gwynn Burly, M.D.    Dg Abd 1 View  10/24/2011  *RADIOLOGY REPORT*  Clinical Data: 72 year old male with abdominal pain.  ABDOMEN - 1 VIEW  Comparison: CTA chest abdomen pelvis from the same day and earlier.  Findings: Lumbar hardware.  Sequelae of prostate brachytherapy. Nonobstructed visualized bowel gas pattern. No acute osseous abnormality identified.  IMPRESSION: Visualized bowel gas pattern is nonobstructed.   Original Report Authenticated By: Harley Hallmark, M.D.    Ct Abd Wo & W Cm  10/25/2011  *RADIOLOGY REPORT*  Clinical Data: Epigastric pain and fever.  Possible ventral hernia. History of prostate cancer and seed implantation. History of appendectomy, cholecystectomy and lumbar fusion.  CT ABDOMEN WITHOUT AND WITH CONTRAST  Technique:  Multidetector CT imaging of the abdomen was performed following the standard protocol before and during bolus administration of intravenous contrast.  Contrast: OMNIPAQUE IOHEXOL 300 MG/ML  SOLN  Comparison: Abdominal CTA 10/24/2011 and abdominal CT 05/12/2010.  Findings: There is stable atelectasis at the right lung base associated with elevation of the right hemidiaphragm.  There is no significant pleural effusion.  There are postsurgical changes status post cholecystectomy. There is a 1.8 cm fluid collection in the cholecystectomy bed adjacent to the surgical clips which was not present in 2012.  There is surrounding soft tissue stranding with a poor fat plane between the cholecystectomy bed and the adjacent  duodenum.  A diverticulum of the second portion of the duodenum more inferiorly appears stable without surrounding inflammation.  There is no other extraluminal fluid collection.  There is no intrahepatic biliary dilatation or pneumobilia.  The liver demonstrates steatosis without focal abnormality.  The spleen, pancreas, adrenal glands and kidneys appear normal.  There is colonic interposition.  There is fluid throughout the colon.  There is no bowel wall thickening or distension.  Aorto iliac atherosclerosis appears stable.  Postsurgical changes of the lumbar spine appear stable status post laminectomy and fusion.  The pelvis was not imaged.  IMPRESSION:  1.  Developing right upper quadrant inflammatory process within the cholecystectomy bed with a small fluid collection and soft tissue stranding surrounding the proximal duodenum.  Findings likely represent peptic ulcer disease or duodenitis.  A primary biliary process is considered less likely in this patient who has not undergone recent cholecystectomy. 2.  No evidence of bowel obstruction or other fluid collection. 3.  Fluid filled colon.   Original Report Authenticated By: Gerrianne Scale, M.D.     Assessment: 71 year old male with upper abdomen, RUQ abdominal pain, fever of unknown etiology but question early pneumonia. LFTs consistent with Gilbert's. CT with developing RUQ small fluid collection and soft tissue stranding around proximal duodenum; question of PUD,duodenitits, less likely biliary etiology. Last EGD in Dec 2012 (erosive reflux esophagitis, small hiatal hernia). Colonic interposition between liver and diaphragm likely not causing discomfort. Good results with enemas yesterday.  Will discuss with Dr. Jena Gauss EGD to evaluate further due to findings on CT.     Plan: Increase Protonix to BID Consider EGD Informed RN to keep pt NPO for now until can be discussed further with Dr. Jena Gauss.     LOS: 2 days   Gerrit Halls  10/26/2011, 12:00  PM    Addendum: Spoke with Dr. Jena Gauss. Proceed with EGD today. Pt had one bite of jello around noon, otherwise has not eaten. Last dose of Lovenox YESTERDAY around 1600. Should not be an issue. Pt aware of proceeding with EGD. RN notified. Endo notified.    Patient now undergoing EGD to further evaluate his illness and CT findings.The risks, benefits, limitations, alternatives and imponderables have been reviewed with the patient. Potential for esophageal dilation, biopsy, etc. have also been reviewed.  Questions have been answered. All parties agreeable.

## 2011-10-26 NOTE — Progress Notes (Signed)
UR Chart Review Completed  

## 2011-10-26 NOTE — Progress Notes (Signed)
Pt transferred to room 209. VSS . NPO for possible endoscopy this afternoon. Pt denies pain or nausea. IV intact. Report given to Quita Skye RN.

## 2011-10-26 NOTE — Progress Notes (Signed)
Subjective: The patient is a fairly comfortable this morning. He continues to have upper abdominal pain which seems to be more controlled with intravenous drugs. He did have a bowel movement following enemas and he did have CT of the abdomen CT of the abdomen revealed developing right upper quadrant within the cholecystectomy bed this could be inflammation or ulcer.  Objective: Vital signs in last 24 hours: Temp:  [97.7 F (36.5 C)-102.2 F (39 C)] 98.8 F (37.1 C) (09/04 0400) Pulse Rate:  [85-103] 87  (09/04 0400) Resp:  [20-24] 20  (09/04 0400) BP: (116-189)/(72-83) 138/78 mmHg (09/04 0400) SpO2:  [85 %-95 %] 94 % (09/04 0400) Weight:  [109.7 kg (241 lb 13.5 oz)] 109.7 kg (241 lb 13.5 oz) (09/04 0500) Weight change: -1.431 kg (-3 lb 2.5 oz) Last BM Date: 10/25/11  Intake/Output from previous day: 09/03 0701 - 09/04 0700 In: 1610 [P.O.:1560; IV Piggyback:50] Out: -  Intake/Output this shift:    Physical examination the patient is alert HEENT negative neck supple lungs clear to P&A heart regular rhythm abdomen tenderness in the upper abdomen he does have ventral hernia in the midabdomen which is tender  Lab Results:  Basename 10/25/11 0947 10/24/11 1656  WBC 9.4 6.8  HGB 16.1 16.3  HCT 48.9 47.1  PLT 131* 113*   BMET  Basename 10/24/11 1656 10/24/11 0453  NA 136 138  K 4.1 3.6  CL 96 99  CO2 34* 31  GLUCOSE 128* 107*  BUN 7 8  CREATININE 0.92 1.01  CALCIUM 9.7 9.6    Studies/Results: Dg Chest 2 View  10/25/2011  *RADIOLOGY REPORT*  Clinical Data: Fever.  CHEST - 2 VIEW  Comparison: 10/18/2011 and 11/13/2007 and a CT scan of the chest dated 10/24/2011  Findings: The heart size and pulmonary vascularity are normal.  No effusions.  Chronic elevation of the right hemidiaphragm with slight scarring in the right lung.  No acute osseous abnormality.  IMPRESSION: No acute abnormality.  No significant change since 11/13/2007.   Original Report Authenticated By: Gwynn Burly, M.D.    Dg Abd 1 View  10/24/2011  *RADIOLOGY REPORT*  Clinical Data: 71 year old male with abdominal pain.  ABDOMEN - 1 VIEW  Comparison: CTA chest abdomen pelvis from the same day and earlier.  Findings: Lumbar hardware.  Sequelae of prostate brachytherapy. Nonobstructed visualized bowel gas pattern. No acute osseous abnormality identified.  IMPRESSION: Visualized bowel gas pattern is nonobstructed.   Original Report Authenticated By: Harley Hallmark, M.D.    Ct Abd Wo & W Cm  10/25/2011  *RADIOLOGY REPORT*  Clinical Data: Epigastric pain and fever.  Possible ventral hernia. History of prostate cancer and seed implantation. History of appendectomy, cholecystectomy and lumbar fusion.  CT ABDOMEN WITHOUT AND WITH CONTRAST  Technique:  Multidetector CT imaging of the abdomen was performed following the standard protocol before and during bolus administration of intravenous contrast.  Contrast: OMNIPAQUE IOHEXOL 300 MG/ML  SOLN  Comparison: Abdominal CTA 10/24/2011 and abdominal CT 05/12/2010.  Findings: There is stable atelectasis at the right lung base associated with elevation of the right hemidiaphragm.  There is no significant pleural effusion.  There are postsurgical changes status post cholecystectomy. There is a 1.8 cm fluid collection in the cholecystectomy bed adjacent to the surgical clips which was not present in 2012.  There is surrounding soft tissue stranding with a poor fat plane between the cholecystectomy bed and the adjacent duodenum.  A diverticulum of the second portion of the  duodenum more inferiorly appears stable without surrounding inflammation.  There is no other extraluminal fluid collection.  There is no intrahepatic biliary dilatation or pneumobilia.  The liver demonstrates steatosis without focal abnormality.  The spleen, pancreas, adrenal glands and kidneys appear normal.  There is colonic interposition.  There is fluid throughout the colon.  There is no bowel wall  thickening or distension.  Aorto iliac atherosclerosis appears stable.  Postsurgical changes of the lumbar spine appear stable status post laminectomy and fusion.  The pelvis was not imaged.  IMPRESSION:  1.  Developing right upper quadrant inflammatory process within the cholecystectomy bed with a small fluid collection and soft tissue stranding surrounding the proximal duodenum.  Findings likely represent peptic ulcer disease or duodenitis.  A primary biliary process is considered less likely in this patient who has not undergone recent cholecystectomy. 2.  No evidence of bowel obstruction or other fluid collection. 3.  Fluid filled colon.   Original Report Authenticated By: Gerrianne Scale, M.D.     Medications: {Reviewed  Assessment/Plan: The patient will be seen again by surgical service today Dr. Lovell Sheehan. His intravenous pain meds will be continued and we will attempt to keep the bowels moving. The treatment plan will be discussed with surgical service.  LOS: 2 days   Yusra Ravert G 10/26/2011, 7:03 AM

## 2011-10-26 NOTE — Care Management Note (Unsigned)
    Page 1 of 1   10/26/2011     3:57:57 PM   CARE MANAGEMENT NOTE 10/26/2011  Patient:  [name], [name]   Account Number:  [id number]  Date Initiated:  10/26/2011  Documentation initiated by:  Sharrie Rothman  Subjective/Objective Assessment:   Pt admitted from home with abd pain. Pt lives with wife and will return home at discharge. Pt is independent with ADL's.     Action/Plan:   No CM/HH needs noted.   Anticipated DC Date:  10/27/2011   Anticipated DC Plan:  HOME/SELF CARE      DC Planning Services  CM consult      Choice offered to / List presented to:             Status of service:  In process, will continue to follow Medicare Important Message given?   (If response is "NO", the following Medicare IM given date fields will be blank) Date Medicare IM given:   Date Additional Medicare IM given:    Discharge Disposition:    Per UR Regulation:    If discussed at Long Length of Stay Meetings, dates discussed:    Comments:  10/26/11 1557 Arlyss Queen, RN BSN CM

## 2011-10-27 ENCOUNTER — Inpatient Hospital Stay (HOSPITAL_COMMUNITY): Payer: Medicare Other

## 2011-10-27 DIAGNOSIS — R509 Fever, unspecified: Secondary | ICD-10-CM

## 2011-10-27 DIAGNOSIS — R1011 Right upper quadrant pain: Secondary | ICD-10-CM

## 2011-10-27 LAB — TROPONIN I: Troponin I: <0.3 ng/mL (ref <0.30)

## 2011-10-27 MED ORDER — SODIUM CHLORIDE 0.9 % IJ SOLN
3.0000 mL | INTRAMUSCULAR | Status: DC | PRN
  Administered 2011-10-27: 3 mL via INTRAVENOUS
  Filled 2011-10-27: qty 3

## 2011-10-27 MED ORDER — SODIUM CHLORIDE 0.9 % IJ SOLN
3.0000 mL | Freq: Two times a day (BID) | INTRAMUSCULAR | Status: DC
  Administered 2011-10-27 – 2011-10-28 (×4): 3 mL via INTRAVENOUS
  Filled 2011-10-27 (×3): qty 3

## 2011-10-27 MED ORDER — HYDROMORPHONE HCL PF 1 MG/ML IJ SOLN
1.0000 mg | Freq: Once | INTRAMUSCULAR | Status: AC
  Administered 2011-10-27: 1 mg via INTRAVENOUS

## 2011-10-27 MED ORDER — SODIUM CHLORIDE 0.9 % IV SOLN: 250.0000 mL | INTRAVENOUS | Status: DC | PRN

## 2011-10-27 NOTE — Progress Notes (Signed)
Pt called me into room, stated that he felt that something was trapped in his throat. He had a sensation that something solid was coming up, but went back down. He has been coughing, trying to get something up, blood tinged sputum noted. Pt encouraged to drink some water to settle his stomach down. He also complains that pain is worse in abd than it has been. Recently medicated. Dr. Darrick Penna made aware of this. No orders at this time. Will address in am by his physicians. Sheryn Bison

## 2011-10-27 NOTE — Progress Notes (Signed)
Subjective:  Continues to have epig/ruq pain. Smell of breakfast tray made him nauseated. Temp 101.6 overnight. Frequent BMs after multiple BM regimens given. No melena, brbpr.   Objective: Vital signs in last 24 hours: Temp:  [97.5 F (36.4 C)-101.6 F (38.7 C)] 97.8 F (36.6 C) (09/05 0544) Pulse Rate:  [68-92] 68  (09/05 0544) Resp:  [14-26] 20  (09/05 0544) BP: (119-154)/(57-94) 122/71 mmHg (09/05 0544) SpO2:  [79 %-98 %] 94 % (09/05 0752) Last BM Date: 10/26/11 General:   Alert,  Well-developed, well-nourished, pleasant and cooperative in NAD. Head:  Normocephalic and atraumatic. Eyes:  Sclera clear, no icterus.  Chest: CTA bilaterally without rales, rhonchi, crackles.    Heart:  Regular rate and rhythm; no murmurs, clicks, rubs,  or gallops. Abdomen:  Soft, mild to moderate epig/ruq tenderness, nondistended. Normal bowel sounds, without guarding, and without rebound.   Extremities:  Without clubbing, deformity or edema. Neurologic:  Alert and  oriented x4;  grossly normal neurologically. Skin:  Intact without significant lesions or rashes. Psych:  Alert and cooperative. Normal mood and affect.  Intake/Output from previous day: 09/04 0701 - 09/05 0700 In: 120 [P.O.:120] Out: -  Intake/Output this shift:    Lab Results: CBC  Basename 10/25/11 0947 10/24/11 1656  WBC 9.4 6.8  HGB 16.1 16.3  HCT 48.9 47.1  MCV 90.9 90.1  PLT 131* 113*   BMET  Basename 10/24/11 1656  NA 136  K 4.1  CL 96  CO2 34*  GLUCOSE 128*  BUN 7  CREATININE 0.92  CALCIUM 9.7   LFTs  Basename 10/25/11 0947  BILITOT 1.8*  BILIDIR 0.4*  IBILI 1.4*  ALKPHOS 75  AST 18  ALT 14  PROT 7.9  ALBUMIN 3.7   Lab Results  Component Value Date   LIPASE 22 10/24/2011      Imaging Studies: Dg Chest 2 View  10/25/2011  *RADIOLOGY REPORT*  Clinical Data: Fever.  CHEST - 2 VIEW  Comparison: 10/18/2011 and 11/13/2007 and a CT scan of the chest dated 10/24/2011  Findings: The heart size and  pulmonary vascularity are normal.  No effusions.  Chronic elevation of the right hemidiaphragm with slight scarring in the right lung.  No acute osseous abnormality.  IMPRESSION: No acute abnormality.  No significant change since 11/13/2007.   Original Report Authenticated By: Gwynn Burly, M.D.     Dg Abd 1 View  10/24/2011  *RADIOLOGY REPORT*  Clinical Data: 71 year old male with abdominal pain.  ABDOMEN - 1 VIEW  Comparison: CTA chest abdomen pelvis from the same day and earlier.  Findings: Lumbar hardware.  Sequelae of prostate brachytherapy. Nonobstructed visualized bowel gas pattern. No acute osseous abnormality identified.  IMPRESSION: Visualized bowel gas pattern is nonobstructed.   Original Report Authenticated By: Harley Hallmark, M.D.    Ct Angio Chest Pe W/cm &/or Wo Cm  10/24/2011  *RADIOLOGY REPORT*  Clinical Data:  MID STERNAL CHEST PAIN, left flank and periumbilical abdominal pain, question dissection, no bowel movement for 2 days, history of prostate cancer, GERD, irritable bowel syndrome  CT ANGIOGRAPHY CHEST, ABDOMEN AND PELVIS  Technique:  Multidetector CT imaging through the chest, abdomen and pelvis was performed using the standard protocol during bolus administration of intravenous contrast.  Multiplanar reconstructed images including MIPs were obtained and reviewed to evaluate the vascular anatomy.  Contrast: OMNIPAQUE IOHEXOL 350 MG/ML SOLN No oral contrast administered.  Comparison:  CT abdomen and pelvis 05/12/2010  CTA CHEST  Findings: Scattered atherosclerotic calcifications  aorta and coronary arteries. No evidence of aortic aneurysm or dissection. Pulmonary arteries patent. No evidence of pulmonary embolism. No thoracic adenopathy. Subsegmental atelectasis at bases of right middle and right lower lobes. No pulmonary infiltrate, pleural effusion or pneumothorax. No acute osseous findings.   Review of the MIP images confirms the above findings.  IMPRESSION: Right basilar  atelectasis. Scattered atherosclerotic disease changes of the aorta without aneurysm or dissection. No other intrathoracic abnormalities identified.  CTA ABDOMEN AND PELVIS  Findings: Scattered atherosclerotic calcifications of the aorta without aneurysm or dissection. Celiac, SMA, and IMA origins are patent without significant stenoses. Gallbladder surgically absent. 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 Liver, spleen, pancreas, kidneys, and adrenal glands normal appearance. Small duodenal diverticulum. Mild sigmoid diverticulosis without evidence of diverticulitis. Brachytherapy seed implants within prostate bed.  Unremarkable bladder and ureters. Stomach and bowel loops normal appearance. No mass, adenopathy, free fluid or free air. No hernia or acute bony lesion. Prior L2-L5 fusion.   Review of the MIP images confirms the above findings.  IMPRESSION: Mild scattered atherosclerotic changes of the abdominal aorta without aneurysm, dissection, or evidence of mesenteric ischemia. Mild sigmoid diverticulosis without evidence of diverticulitis.   Original Report Authenticated By: Lollie Marrow, M.D.    Ct Abd Wo & W Cm  10/25/2011  *RADIOLOGY REPORT*  Clinical Data: Epigastric pain and fever.  Possible ventral hernia. History of prostate cancer and seed implantation. History of appendectomy, cholecystectomy and lumbar fusion.  CT ABDOMEN WITHOUT AND WITH CONTRAST  Technique:  Multidetector CT imaging of the abdomen was performed following the standard protocol before and during bolus administration of intravenous contrast.  Contrast: OMNIPAQUE IOHEXOL 300 MG/ML  SOLN  Comparison: Abdominal CTA 10/24/2011 and abdominal CT 05/12/2010.  Findings: There is stable atelectasis at the right lung base associated with elevation of the right hemidiaphragm.  There is no significant pleural effusion.  There are postsurgical changes status post cholecystectomy. There is a 1.8 cm fluid collection in the  cholecystectomy bed adjacent to the surgical clips which was not present in 2012.  There is surrounding soft tissue stranding with a poor fat plane between the cholecystectomy bed and the adjacent duodenum.  A diverticulum of the second portion of the duodenum more inferiorly appears stable without surrounding inflammation.  There is no other extraluminal fluid collection.  There is no intrahepatic biliary dilatation or pneumobilia.  The liver demonstrates steatosis without focal abnormality.  The spleen, pancreas, adrenal glands and kidneys appear normal.  There is colonic interposition.  There is fluid throughout the colon.  There is no bowel wall thickening or distension.  Aorto iliac atherosclerosis appears stable.  Postsurgical changes of the lumbar spine appear stable status post laminectomy and fusion.  The pelvis was not imaged.  IMPRESSION:  1.  Developing right upper quadrant inflammatory process within the cholecystectomy bed with a small fluid collection and soft tissue stranding surrounding the proximal duodenum.  Findings likely represent peptic ulcer disease or duodenitis.  A primary biliary process is considered less likely in this patient who has not undergone recent cholecystectomy. 2.  No evidence of bowel obstruction or other fluid collection. 3.  Fluid filled colon.   Original Report Authenticated By: Gerrianne Scale, M.D.         Assessment: 71 y/o male with RUQ pain, fever. CT with developing RUQ small fluid collection and soft tissue stranding around proximal duodenum; question of PUD, duodenitis. EGD yesterday not very impressive. Small hh. Abnormal gastric mucosa  of doubtful clinical significance s/p bx. Subtle changes of duodenal mucosa, s/p bx. Patient with temp 101.6 last night. On Zithromax, Rocephin.  Plan: 1. Continue current regimen. 2. Continue close monitoring. 3. F/u path when available. 4. To discuss further with Dr. Jena Gauss.   LOS: 3 days   Tana Coast  10/27/2011,  8:11 AM   Discussed with Dr. Jena Gauss. Abd u/s to reevaluate fluid collection at gb fossa. If increased in size, patient may benefit from HIDA.  Reviewed ultrasound with Dr. Denny Levy. Fluid in gallbladder fossa stable (less than 2 cm). No evidence for ascites, abscess or any other acute/infectious process. This afternoon patient states he feels much better abdominal pain resolved except for some "gas". Simethicone ordered.  2 normal bowel movements today. His abdomen is essentially soft and entirely nontender at this time. He states he still not hungry. Will advancing to a clear liquid diet and on from there in the morning if he's doing well.  The cause of this acute illness is not entirely clear at this time.

## 2011-10-27 NOTE — Progress Notes (Signed)
Subjective: This patient is fairly comfortable as morning. According to nursing staff he is running low-grade fever. He did have EGD done yesterday by gastroenterology service apparent in a tolerated the procedure in satisfactory manner. He continues to have some abdominal pain. Apparently the EGD revealed small hiatal hernia. The patient's bowels have moved several times.  Objective: Vital signs in last 24 hours: Temp:  [97.5 F (36.4 C)-101.6 F (38.7 C)] 97.8 F (36.6 C) (09/05 0544) Pulse Rate:  [68-92] 68  (09/05 0544) Resp:  [14-26] 20  (09/05 0544) BP: (119-154)/(57-94) 122/71 mmHg (09/05 0544) SpO2:  [79 %-98 %] 93 % (09/05 0544) Weight change:  Last BM Date: 10/26/11  Intake/Output from previous day: 09/04 0701 - 09/05 0700 In: 120 [P.O.:120] Out: -  Intake/Output this shift:    Physical Exam: Physical examination the patient remains alert HEENT negative, lungs clear to P&A, abdomen patient does have slight tenderness in the upper abdomen and does have ventral hernia which is easily reducible. Extremities she did not show evidence of edema or other abnormalities.    Basename 10/25/11 0947 10/24/11 1656  WBC 9.4 6.8  HGB 16.1 16.3  HCT 48.9 47.1  PLT 131* 113*   BMET  Basename 10/24/11 1656  NA 136  K 4.1  CL 96  CO2 34*  GLUCOSE 128*  BUN 7  CREATININE 0.92  CALCIUM 9.7    Studies/Results: Dg Chest 2 View  10/25/2011  *RADIOLOGY REPORT*  Clinical Data: Fever.  CHEST - 2 VIEW  Comparison: 10/18/2011 and 11/13/2007 and a CT scan of the chest dated 10/24/2011  Findings: The heart size and pulmonary vascularity are normal.  No effusions.  Chronic elevation of the right hemidiaphragm with slight scarring in the right lung.  No acute osseous abnormality.  IMPRESSION: No acute abnormality.  No significant change since 11/13/2007.   Original Report Authenticated By: Gwynn Burly, M.D.    Ct Abd Wo & W Cm  10/25/2011  *RADIOLOGY REPORT*  Clinical Data: Epigastric  pain and fever.  Possible ventral hernia. History of prostate cancer and seed implantation. History of appendectomy, cholecystectomy and lumbar fusion.  CT ABDOMEN WITHOUT AND WITH CONTRAST  Technique:  Multidetector CT imaging of the abdomen was performed following the standard protocol before and during bolus administration of intravenous contrast.  Contrast: OMNIPAQUE IOHEXOL 300 MG/ML  SOLN  Comparison: Abdominal CTA 10/24/2011 and abdominal CT 05/12/2010.  Findings: There is stable atelectasis at the right lung base associated with elevation of the right hemidiaphragm.  There is no significant pleural effusion.  There are postsurgical changes status post cholecystectomy. There is a 1.8 cm fluid collection in the cholecystectomy bed adjacent to the surgical clips which was not present in 2012.  There is surrounding soft tissue stranding with a poor fat plane between the cholecystectomy bed and the adjacent duodenum.  A diverticulum of the second portion of the duodenum more inferiorly appears stable without surrounding inflammation.  There is no other extraluminal fluid collection.  There is no intrahepatic biliary dilatation or pneumobilia.  The liver demonstrates steatosis without focal abnormality.  The spleen, pancreas, adrenal glands and kidneys appear normal.  There is colonic interposition.  There is fluid throughout the colon.  There is no bowel wall thickening or distension.  Aorto iliac atherosclerosis appears stable.  Postsurgical changes of the lumbar spine appear stable status post laminectomy and fusion.  The pelvis was not imaged.  IMPRESSION:  1.  Developing right upper quadrant inflammatory process within  the cholecystectomy bed with a small fluid collection and soft tissue stranding surrounding the proximal duodenum.  Findings likely represent peptic ulcer disease or duodenitis.  A primary biliary process is considered less likely in this patient who has not undergone recent  cholecystectomy. 2.  No evidence of bowel obstruction or other fluid collection. 3.  Fluid filled colon.   Original Report Authenticated By: Gerrianne Scale, M.D.     Medications: Medications reviewed    . azithromycin  500 mg Oral Daily  . cefTRIAXone (ROCEPHIN) IVPB 1 gram/50 mL D5W  1 g Intravenous Q24H  . docusate sodium  100 mg Oral BID  . enoxaparin  40 mg Subcutaneous Q24H  . meperidine      . midazolam      . mineral oil  1 enema Rectal Once  . pantoprazole  40 mg Oral BID AC  . sucralfate  1 g Oral TID WC & HS  . DISCONTD: pantoprazole  40 mg Oral Daily       . sodium chloride 20 mL/hr at 10/26/11 1851     Assessment/Plan: The patient was admitted with abdominal pain. He does have a ventral hernia which is easily reducible. He also has small hiatal hernia and gastric erosions. He continues to run low-grade fever. We will continue current antibiotics. The patient was placed on Carafate by gastroenterology service. We will continue current regimen.    LOS: 3 days   Rody Keadle G 10/27/2011, 5:54 AM

## 2011-10-28 ENCOUNTER — Encounter (HOSPITAL_COMMUNITY): Payer: Self-pay | Admitting: Internal Medicine

## 2011-10-28 LAB — CBC WITH DIFFERENTIAL/PLATELET
Basophils Absolute: 0 10*3/uL (ref 0.0–0.1)
Eosinophils Relative: 2 % (ref 0–5)
Lymphocytes Relative: 11 % — ABNORMAL LOW (ref 12–46)
Lymphs Abs: 0.6 10*3/uL — ABNORMAL LOW (ref 0.7–4.0)
MCV: 89 fL (ref 78.0–100.0)
Neutro Abs: 3.9 10*3/uL (ref 1.7–7.7)
Platelets: 107 10*3/uL — ABNORMAL LOW (ref 150–400)
RBC: 4.64 MIL/uL (ref 4.22–5.81)
RDW: 13 % (ref 11.5–15.5)
WBC: 5.3 10*3/uL (ref 4.0–10.5)

## 2011-10-28 LAB — BASIC METABOLIC PANEL
CO2: 32 mEq/L (ref 19–32)
Calcium: 9 mg/dL (ref 8.4–10.5)
Chloride: 92 mEq/L — ABNORMAL LOW (ref 96–112)
GFR calc Af Amer: >90 mL/min (ref >90)
Sodium: 132 mEq/L — ABNORMAL LOW (ref 135–145)

## 2011-10-28 LAB — HEPATIC FUNCTION PANEL
Bilirubin, Direct: 0.2 mg/dL (ref 0.0–0.3)
Indirect Bilirubin: 0.4 mg/dL (ref 0.3–0.9)
Total Protein: 6.8 g/dL (ref 6.0–8.3)

## 2011-10-28 LAB — TROPONIN I: Troponin I: <0.3 ng/mL (ref <0.30)

## 2011-10-28 MED ORDER — HYDROMORPHONE HCL 2 MG PO TABS: 1.0000 mg | ORAL_TABLET | Freq: Four times a day (QID) | ORAL | Status: DC

## 2011-10-28 MED ORDER — BOOST / RESOURCE BREEZE PO LIQD
1.0000 | Freq: Three times a day (TID) | ORAL | Status: DC
  Administered 2011-10-28 (×2): 1 via ORAL
  Filled 2011-10-28 (×10): qty 1

## 2011-10-28 MED ORDER — PIPERACILLIN-TAZOBACTAM 3.375 G IVPB
3.3750 g | Freq: Three times a day (TID) | INTRAVENOUS | Status: DC
  Administered 2011-10-28 – 2011-10-29 (×2): 3.375 g via INTRAVENOUS
  Filled 2011-10-28 (×9): qty 50

## 2011-10-28 MED ORDER — PIPERACILLIN-TAZOBACTAM 3.375 G IVPB
INTRAVENOUS | Status: AC
  Filled 2011-10-28: qty 100

## 2011-10-28 NOTE — Progress Notes (Signed)
Pt told NT that he did not want a bath at this time. Wants to wait for his wife to get here. Will recheck on patient later.

## 2011-10-28 NOTE — Progress Notes (Signed)
INITIAL ADULT NUTRITION ASSESSMENT Date: 10/28/2011   Time: 11:20 AM Reason for Assessment: poor po's  INTERVENTION: Resource Breeze po TID, each supplement provides 250 kcal and 9 grams of protein.  ASSESSMENT: Male 71 y.o.  Dx: <principal problem not specified>  Hx:  Past Medical History  Diagnosis Date  . Arthritis   . Numbness     left leg after back surgery  . Chronic back pain   . GERD (gastroesophageal reflux disease)   . Hx of adenomatous colonic polyps   . Schatzki's ring     History of  . Helicobacter pylori gastritis     Treated  . IBS (irritable bowel syndrome)   . Prostate ca     seed implants   Past Surgical History  Procedure Date  . Appendectomy age 5  . Back surgery 02/08    [facility]  . Back surgery 09/24/04    lumbar, [facility]  . Back surgery 09/18/02    [facility]  . Cholecystectomy 03/04/05    [facility], [facility]. Gangrenous cholecystitis complicated by abscess requiring percutaneous drainage. He also had common duct stones requiring ERCP and sphincterotomy.  . Ercp 03/02/05    [facility], [facility]. Normal-appearing biliary tree (gallbladder not image), status post sphincterotomy with recovery of small pieces of stone material, status post balloon occlusion cholangiogram.  . Cataract extraction w/phaco 11/22/2010    Procedure: CATARACT EXTRACTION PHACO AND INTRAOCULAR LENS PLACEMENT (IOC);  Surgeon: Susa Simmonds;  Location: [facility];  Service: Ophthalmology;  Laterality: Right;  CDE: 6.81  . Esophagogastroduodenoscopy August 2005    Erosive esophagitis and Schatzki ring, small hiatal hernia  . Colonoscopy August 2005    Scattered sigmoid diverticulosis, splenic flexure polyp. Hyperplastic  . Colonoscopy 1997    3 cm tubular adenoma and a sending colon  . Incomplete colonoscopy December 2009    Left-sided diverticula, mid descending colon polyp, due to recurrent looping and redundancy exam was incomplete. It was felt that the mid colon was reached. Followup barium enema showed  colon interposition between the liver and the diaphragm, redundant sigmoid colon but no colon mass or polyp identified.. Pathology revealed tubular adenoma.  . Esophagogastroduodenoscopy 01/2008    Mild reflux esophagitis, small hiatal hernia  . Colonoscopy 02/01/2011    Rourk-friable anal canal, hyperplastic rectal polyp  . Esophagogastroduodenoscopy 02/01/2011    Rourk-erosive reflux esophagitis, small hiatal hernia   Related Meds:  Scheduled Meds:   . azithromycin  500 mg Oral Daily  . cefTRIAXone (ROCEPHIN) IVPB 1 gram/50 mL D5W  1 g Intravenous Q24H  . docusate sodium  100 mg Oral BID  . enoxaparin  40 mg Subcutaneous Q24H  .  HYDROmorphone (DILAUDID) injection  1 mg Intravenous Once  . mineral oil  1 enema Rectal Once  . pantoprazole  40 mg Oral BID AC  . sodium chloride  3 mL Intravenous Q12H  . sucralfate  1 g Oral TID WC & HS   Continuous Infusions:   . sodium chloride 20 mL/hr at 10/26/11 1851   PRN Meds:.sodium chloride, acetaminophen, HYDROmorphone (DILAUDID) injection, simethicone, sodium chloride  Ht: 6\' 2"  (188 cm)  Wt: 241 lb 13.5 oz (109.7 kg)  Ideal Wt:  82.2 kg % Ideal Wt: 113%  Usual Wt: 240# % Usual Wt: 100%  Body mass index is 31.05 kg/(m^2). Classified as obesity, class I.   Food/Nutrition Related Hx: Pt reports poor po intake due to terrible GI pain. PO intake 0-50%, noted multiple refusals of trays. Pt expressed frustration about being  on a clear liquid diet for most of his admission. He denies weight loss. He reports he likes Ensure and reports that he "lived on it" after he had gallbladder surgery. He is agreeable to try [employer].   Labs:  CMP     Component Value Date/Time   NA 132* 10/28/2011 0859   K 3.7 10/28/2011 0859   CL 92* 10/28/2011 0859   CO2 32 10/28/2011 0859   GLUCOSE 109* 10/28/2011 0859   BUN 16 10/28/2011 0859   CREATININE 0.79 10/28/2011 0859   CALCIUM 9.0 10/28/2011 0859   PROT 6.8 10/28/2011 0859   ALBUMIN 2.9* 10/28/2011 0859    AST 27 10/28/2011 0859   ALT 18 10/28/2011 0859   ALKPHOS 71 10/28/2011 0859   BILITOT 0.6 10/28/2011 0859   GFRNONAA 88* 10/28/2011 0859   GFRAA >90 10/28/2011 0859   Intake:   Intake/Output Summary (Last 24 hours) at 10/28/11 1144 Last data filed at 10/27/11 1800  Gross per 24 hour  Intake      3 ml  Output      0 ml  Net      3 ml     Diet Order: Clear Liquid  Supplements/Tube Feeding: none at this time  IVF:    sodium chloride Last Rate: 20 mL/hr at 10/26/11 1851    Estimated Nutritional Needs:   Kcal:2219-2421 kcals daily Protein:88-110 grams protein daily Fluid:2.2-2.4 L fluid daily  NUTRITION DIAGNOSIS: -Inadequate oral intake (NI-2.1).  Status: Ongoing  RELATED TO: stomach pain, altered GI function  AS EVIDENCE BY: PO: 0-50%.   MONITORING/EVALUATION(Goals): Weight, diet advancement, po's, labs, changes in status. 1) Pt will meet >75% of estimated energy and protein needs 2) Pt will maintain current weight of 241#  EDUCATION NEEDS: -No education needs identified at this time  Dietitian #: [phone]  DOCUMENTATION CODES Per approved criteria  -Obesity Unspecified    Melody Haver, RD, LDN 10/28/2011, 11:20 AM

## 2011-10-28 NOTE — Progress Notes (Signed)
Subjective: The patient had moderate to severe pain in epigastric area through the night. He states that his bowels did not move yesterday. He did receive IV medication for pain through the night. An electrocardiogram revealed first degree AV block and left ventricular hypertrophy.   Obje  edema or other Vital signs in last 24 hours: Temp:  [98.4 F (36.9 C)-99.3 F (37.4 C)] 98.5 F (36.9 C) (09/06 0542) Pulse Rate:  [68-73] 68  (09/06 0542) Resp:  [18-20] 18  (09/06 0542) BP: (115-150)/(68-76) 144/76 mmHg (09/06 0542) SpO2:  [90 %-99 %] 94 % (09/06 0542) Weight change:  Last BM Date: 10/27/11  Intake/Output from previous day: 09/05 0701 - 09/06 0700 In: 123 [P.O.:120; I.V.:3] Out: -    Physical examination the patient remains alert HEENT negative lungs clear to P&A heart regular rhythm abdomen patient does have slight tenderness in the upper abdomen and does have ventral hernia which is easily reducible extremities did not show evidence of edema or other abnormalities   Basename 10/25/11 0947  WBC 9.4  HGB 16.1  HCT 48.9  PLT 131*   BMET No results found for this basename: NA:2,K:2,CL:2,CO2:2,GLUCOSE:2,BUN:2,CREATININE:2,CALCIUM:2 in the last 72 hours  Studies/Results: US Abdomen Limited Ruq  10/27/2011  *RADIOLOGY REPORT*  Clinical Data:  Fever, right upper quadrant pain  LIMITED ABDOMINAL ULTRASOUND - RIGHT UPPER QUADRANT  Comparison:  10/25/2011 CT  Findings:  Gallbladder:  Prior cholecystectomy.  Small hypoechoic fluid collection along the gallbladder fossa measures 2.8 x 2.0 x 1.8 cm remains nonspecific.  Common bile duct:  5 mm diameter.  No dilatation obstruction  Liver:  Diffuse increased echogenicity compatible with fatty infiltration.  No intrapelvic biliary dilatation or focal hepatic abnormality.  IMPRESSION: Small hypoechoic nonspecific fluid collection along the gallbladder fossa status post cholecystectomy.  No biliary dilatation or obstruction  Hepatic steatosis                     Original Report Authenticated By: Judie Petit. Ruel Favors, M.D.     Medications: Reviewed    . azithromycin  500 mg Oral Daily  . cefTRIAXone (ROCEPHIN) IVPB 1 gram/50 mL D5W  1 g Intravenous Q24H  . docusate sodium  100 mg Oral BID  . enoxaparin  40 mg Subcutaneous Q24H  .  HYDROmorphone (DILAUDID) injection  1 mg Intravenous Once  . mineral oil  1 enema Rectal Once  . pantoprazole  40 mg Oral BID AC  . sodium chloride  3 mL Intravenous Q12H  . sucralfate  1 g Oral TID WC & HS       . sodium chloride 20 mL/hr at 10/26/11 1851     Assessment/Plan: The patient was admitted with abdominal pain. He does have a ventral hernia which isreducible he does have small hiatal hernia and gastric erosions. He was placed on IV antibiotics because of the possibility of this being early infection possible pneumonitis. The discomfort seems to be of GI origin. Plan to continue current regimen. The patient will need continued medication for pain.  LOS: 4 days   Haadi Santellan G 10/28/2011, 6:22 AM

## 2011-10-28 NOTE — Progress Notes (Addendum)
Subjective:  Was feeling better yesterday afternoon but recurrent abdominal pain last night. Patient had episode of feeling like something stuck in throat. Tried several times coughing it up. Coughed up blood tinged "sputum". Drank lot of water and finally sensation went away. C/O worsening epigastric/ruq pain overnight. Took Dilaudid 7mg  since 5pm yesterday. Had not used any during the day prior to that. No vomiting. BM yesterday. Pain has a raw feeling. Worse with deep breath.   Objective: Vital signs in last 24 hours: Temp:  [98.4 F (36.9 C)-99.3 F (37.4 C)] 98.5 F (36.9 C) (09/06 0542) Pulse Rate:  [68-73] 68  (09/06 0542) Resp:  [18-20] 18  (09/06 0542) BP: (115-150)/(68-76) 144/76 mmHg (09/06 0542) SpO2:  [90 %-99 %] 94 % (09/06 0542) Last BM Date: 10/27/11 General:   Alert,  Well-developed, well-nourished, pleasant and cooperative in NAD Head:  Normocephalic and atraumatic. Eyes:  Sclera clear, no icterus.  Chest: CTA bilaterally without rales, rhonchi, crackles.    Heart:  Regular rate and rhythm; no murmurs, clicks, rubs,  or gallops. Abdomen:  Soft, nontender and nondistended. BS normal, noted under right ribs. Extremities:  Without clubbing, deformity or edema. Neurologic:  Alert and  oriented x4;  grossly normal neurologically. Skin:  Intact without significant lesions or rashes. Psych:  Alert and cooperative. Normal mood and affect.  Intake/Output from previous day: 09/05 0701 - 09/06 0700 In: 123 [P.O.:120; I.V.:3] Out: -  Intake/Output this shift:    Lab Results: CBC  Basename 10/25/11 0947  WBC 9.4  HGB 16.1  HCT 48.9  MCV 90.9  PLT 131*   LFTs  Basename 10/25/11 0947  BILITOT 1.8*  BILIDIR 0.4*  IBILI 1.4*  ALKPHOS 75  AST 18  ALT 14  PROT 7.9  ALBUMIN 3.7   Imaging Studies: Dg Chest 2 View  10/25/2011  *RADIOLOGY REPORT*  Clinical Data: Fever.  CHEST - 2 VIEW  Comparison: 10/18/2011 and 11/13/2007 and a CT scan of the chest dated 10/24/2011   Findings: The heart size and pulmonary vascularity are normal.  No effusions.  Chronic elevation of the right hemidiaphragm with slight scarring in the right lung.  No acute osseous abnormality.  IMPRESSION: No acute abnormality.  No significant change since 11/13/2007.   Original Report Authenticated By: Gwynn Burly, M.D.    Dg Abd 1 View  10/24/2011  *RADIOLOGY REPORT*  Clinical Data: 71 year old male with abdominal pain.  ABDOMEN - 1 VIEW  Comparison: CTA chest abdomen pelvis from the same day and earlier.  Findings: Lumbar hardware.  Sequelae of prostate brachytherapy. Nonobstructed visualized bowel gas pattern. No acute osseous abnormality identified.  IMPRESSION: Visualized bowel gas pattern is nonobstructed.   Original Report Authenticated By: Harley Hallmark, M.D.    Ct Angio Chest Pe W/cm &/or Wo Cm  10/24/2011  *RADIOLOGY REPORT*  Clinical Data:  MID STERNAL CHEST PAIN, left flank and periumbilical abdominal pain, question dissection, no bowel movement for 2 days, history of prostate cancer, GERD, irritable bowel syndrome  CT ANGIOGRAPHY CHEST, ABDOMEN AND PELVIS  Technique:  Multidetector CT imaging through the chest, abdomen and pelvis was performed using the standard protocol during bolus administration of intravenous contrast.  Multiplanar reconstructed images including MIPs were obtained and reviewed to evaluate the vascular anatomy.  Contrast: OMNIPAQUE IOHEXOL 350 MG/ML SOLN No oral contrast administered.  Comparison:  CT abdomen and pelvis 05/12/2010  CTA CHEST  Findings: Scattered atherosclerotic calcifications aorta and coronary arteries. No evidence of aortic aneurysm or dissection.  Pulmonary arteries patent. No evidence of pulmonary embolism. No thoracic adenopathy. Subsegmental atelectasis at bases of right middle and right lower lobes. No pulmonary infiltrate, pleural effusion or pneumothorax. No acute osseous findings.   Review of the MIP images confirms the above findings.   IMPRESSION: Right basilar atelectasis. Scattered atherosclerotic disease changes of the aorta without aneurysm or dissection. No other intrathoracic abnormalities identified.  CTA ABDOMEN AND PELVIS  Findings: Scattered atherosclerotic calcifications of the aorta without aneurysm or dissection. Celiac, SMA, and IMA origins are patent without significant stenoses. Gallbladder surgically absent. 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 Liver, spleen, pancreas, kidneys, and adrenal glands normal appearance. Small duodenal diverticulum. Mild sigmoid diverticulosis without evidence of diverticulitis. Brachytherapy seed implants within prostate bed.  Unremarkable bladder and ureters. Stomach and bowel loops normal appearance. No mass, adenopathy, free fluid or free air. No hernia or acute bony lesion. Prior L2-L5 fusion.   Review of the MIP images confirms the above findings.  IMPRESSION: Mild scattered atherosclerotic changes of the abdominal aorta without aneurysm, dissection, or evidence of mesenteric ischemia. Mild sigmoid diverticulosis without evidence of diverticulitis.   Original Report Authenticated By: Lollie Marrow, M.D.    Ct Abd Wo & W Cm  10/25/2011  *RADIOLOGY REPORT*  Clinical Data: Epigastric pain and fever.  Possible ventral hernia. History of prostate cancer and seed implantation. History of appendectomy, cholecystectomy and lumbar fusion.  CT ABDOMEN WITHOUT AND WITH CONTRAST  Technique:  Multidetector CT imaging of the abdomen was performed following the standard protocol before and during bolus administration of intravenous contrast.  Contrast: OMNIPAQUE IOHEXOL 300 MG/ML  SOLN  Comparison: Abdominal CTA 10/24/2011 and abdominal CT 05/12/2010.  Findings: There is stable atelectasis at the right lung base associated with elevation of the right hemidiaphragm.  There is no significant pleural effusion.  There are postsurgical changes status post cholecystectomy. There is a 1.8 cm  fluid collection in the cholecystectomy bed adjacent to the surgical clips which was not present in 2012.  There is surrounding soft tissue stranding with a poor fat plane between the cholecystectomy bed and the adjacent duodenum.  A diverticulum of the second portion of the duodenum more inferiorly appears stable without surrounding inflammation.  There is no other extraluminal fluid collection.  There is no intrahepatic biliary dilatation or pneumobilia.  The liver demonstrates steatosis without focal abnormality.  The spleen, pancreas, adrenal glands and kidneys appear normal.  There is colonic interposition.  There is fluid throughout the colon.  There is no bowel wall thickening or distension.  Aorto iliac atherosclerosis appears stable.  Postsurgical changes of the lumbar spine appear stable status post laminectomy and fusion.  The pelvis was not imaged.  IMPRESSION:  1.  Developing right upper quadrant inflammatory process within the cholecystectomy bed with a small fluid collection and soft tissue stranding surrounding the proximal duodenum.  Findings likely represent peptic ulcer disease or duodenitis.  A primary biliary process is considered less likely in this patient who has not undergone recent cholecystectomy. 2.  No evidence of bowel obstruction or other fluid collection. 3.  Fluid filled colon.   Original Report Authenticated By: Gerrianne Scale, M.D.    US Abdomen Limited Ruq  10/27/2011  *RADIOLOGY REPORT*  Clinical Data:  Fever, right upper quadrant pain  LIMITED ABDOMINAL ULTRASOUND - RIGHT UPPER QUADRANT  Comparison:  10/25/2011 CT  Findings:  Gallbladder:  Prior cholecystectomy.  Small hypoechoic fluid collection along the gallbladder fossa measures 2.8 x 2.0 x  1.8 cm remains nonspecific.  Common bile duct:  5 mm diameter.  No dilatation obstruction  Liver:  Diffuse increased echogenicity compatible with fatty infiltration.  No intrapelvic biliary dilatation or focal hepatic abnormality.   IMPRESSION: Small hypoechoic nonspecific fluid collection along the gallbladder fossa status post cholecystectomy.  No biliary dilatation or obstruction  Hepatic steatosis                    Original Report Authenticated By: Judie Petit. Ruel Favors, M.D.   [2 weeks]   Assessment: 71 y/o male with RUQ pain, fever. CT with developing RUQ small fluid collection and soft tissue stranding around proximal duodenum; question of PUD, duodenitis. EGD not very impression. Bx of gastric and duodenal mucosa pending. Abd u/s with small stable amount of fluid in gb fossa. Afebrile last 24 hours. Pain recurred last night, patient states worse than before. Troponin X 2 negative.   Plan: 1. F/U path when available.  2. LFTs, CBC, Met-7.   LOS: 4 days   Tana Coast  10/28/2011, 7:45 AM   REVIEWED. AGREE. SEVER ABD PAIN-ETIOLOGY UNCLEAR. CONSIDER MICRO-PERFORATION IN DUODENAL DIVERTICULUM?  AWAITING SURGICAL CONSULT. DIL ATC FOR BETTER PAIN CONTROL.

## 2011-10-28 NOTE — Progress Notes (Signed)
Dr. Darrick Penna will be rounding for me in my absence today. Dr. Renard Matter called to discuss the patient. Etiology of ongoing right upper quadrant symptoms not clear. I have called Dr. Lovell Sheehan and again discussed the case with him. I do not feel the patient has any significant intraluminal upper GI tract pathology to explain presentation. Biopsies not yet available but not likely to yield significant information. I would continue to be concerned about an occult hernia or other potential surgical process contributing to the clinical picture - but nothing definitive on serial CTs.  I have asked Dr. Lovell Sheehan to come back and see him.

## 2011-10-29 MED ORDER — SUCRALFATE 1 GM/10ML PO SUSP: 1.0000 g | Freq: Three times a day (TID) | ORAL | Status: DC

## 2011-10-29 MED ORDER — SIMETHICONE 80 MG PO CHEW: 160.0000 mg | CHEWABLE_TABLET | ORAL | Status: DC | PRN

## 2011-10-29 MED ORDER — CIPROFLOXACIN HCL 500 MG PO TABS: 500.0000 mg | ORAL_TABLET | Freq: Two times a day (BID) | ORAL | Status: AC

## 2011-10-29 NOTE — Discharge Summary (Signed)
[name], [name] NO.:  [id number]  MEDICAL RECORD NO.:  [id number]  LOCATION:  A206                          FACILITY:  [facility]  PHYSICIAN:  Harpreet Pompey G. Renard Matter, MD   DATE OF BIRTH:  July 23, 1940  DATE OF ADMISSION:  10/24/2011 DATE OF DISCHARGE:  09/07/2013LH                              DISCHARGE SUMMARY   This patient was admitted October 24, 2011, discharged October 29, 2011, 5 days hospitalizations.  DIAGNOSES:  Abdominal pain, obstipation, ventral hernia, small hiatal hernia, gastric erosions, history of prostate cancer, history of chronic low back pain.  The patient's condition stable at the time of discharge. A 71 year old white male admitted to the ED with a chief complaint being lower abdominal pain.  The patient was seen by ED physician.  He had developed mid abdominal pain which had been gradually becoming worse over the past few days.  He had apparently not had a bowel movement in the past 36 hours.  His pain was markedly severe.  He does have a known ventral hernia.  CT of the abdomen showed mild atherosclerotic changes in the abdominal aorta without aneurysm or dissection.  No evidence of mesenteric ischemia.  No evidence of diverticulitis.  Prior acute abdominal series showed chronic elevation of right diaphragm with right basilar atelectasis.  The patient did have blood pressure on admission of 200/88.  This improved and his symptoms improved with intravenous pain medications.  PHYSICAL EXAMINATION:  GENERAL:  On admission; uncomfortable male patient with blood pressure 185/100, respirations 24, pulse 87, temp 98.2.  The patient is alert but uncomfortable. HEENT:  Eyes, PERRLA.  TMs negative.  Oropharynx benign. NECK:  Supple.  No JVD or thyroid abnormalities. LUNGS:  Clear to P and A. HEART:  Regular rhythm.  No murmurs. ABDOMEN:  No palpable organs or masses.  No organomegaly.  Ventral wall hernia present which was easily reducible.   External genitalia within normal limits. SKIN:  Warm and dry. NEUROLOGIC:  The patient is alert.  Cranial nerves intact.  No motor or sensory abnormalities noted.  At the time of his admission, he was given IV Dilaudid and enemas to able him to have a bowel movement if possible.  LABORATORY DATA:  Urinalysis on admission negative.  Chemistries on October 28, 2011, sodium 132, potassium 3.7, chloride 92, CO2 32, BUN 16, creatinine 0.79, calcium 9, glucose 109, alkaline phosphatase 71, albumin 2.9, amylase 42, AST 27, ALT 18, bilirubin direct 0.2 and indirect 0.4.  CBC on this date; WBC 5300 with hemoglobin 14.0, TSH is 2.862, troponin less than 0.3.  RADIOLOGY:  Ultrasound of the abdomen, small hypoechogenic nonspecific fluid collection along the gallbladder fossa status post cholecystectomy.  No biliary dilatation or obstruction, but hepatic steatosis.  CT of abdomen with and without contrast.  Impression was developing right upper quadrant inflammatory process within the cholecystectomy bed with a small fluid collection, soft tissues stranding surrounding the proximal duodenum, findings likely representative of peptic ulcer disease or duodenitis.  No evidence of bowel obstruction.  No other fluid collection noted.  Chest x-ray shows visualized bowel gas pattern nonobstructed.  HOSPITAL COURSE:  The patient was admitted  to step-down unit in intensive care initially.  He was extremely uncomfortable for most of his hospital stay requiring IV Dilaudid for control of his pain.  IV fluids were started.  Normal saline.  He was given Lovenox injection 40 mg subcutaneously daily.  He was started on pantoprazole 40 mg b.i.d. and had to be given hydromorphone in a form of Dilaudid 1 mg IV every 2 hours p.r.n. for pain, Mylicon chewable tablets were used as well.  He was empirically placed on Zithromax 500 mg daily and Zosyn 3.375 g every 8 hours.  The patient was seen both by Dr. Lovell Sheehan from  Surgery and by Dr. Jena Gauss and associates from GI Service.  The patient did have EGD which showed evidence of gastric erosions, small hiatal hernia. Recommendations following this was low residue diet with the addition of Carafate to regimen and follow up on pathology.  Dr. Lovell Sheehan from Surgery did not feel that the hernia was causing the discomfort and did not feel that he needed to go back in surgically on the patient.  The patient slowly improved, but for most of his hospitalization had to rely on IV Dilaudid for relief of his pain.  His bowels were problem and he had enemas to finally get relief and on hospital visit today, the patient felt to have had a good night with no additional pain and he is tolerating his intake of food well and desires to go home.     Kenon Delashmit G. Renard Matter, MD     AGM/MEDQ  D:  10/29/2011  T:  10/29/2011  Job:  [id number]

## 2011-10-29 NOTE — Progress Notes (Signed)
Patient received discharge instructions along with follow up appointments and prescriptions. Patient verbalized understanding of all instructions. Patient was escorted by staff to vehicle. Patient discharged to home in stable condition. 

## 2011-10-29 NOTE — Progress Notes (Signed)
3 Days Post-Op  Subjective: Patient reports no abdominal pain or fevers. He would like to go home.  Objective: Vital signs in last 24 hours: Temp:  [97.6 F (36.4 C)-98.5 F (36.9 C)] 98.5 F (36.9 C) (09/07 0409) Pulse Rate:  [63-76] 63  (09/07 0409) Resp:  [18] 18  (09/07 0409) BP: (144-150)/(73-78) 144/77 mmHg (09/07 0409) SpO2:  [92 %-98 %] 92 % (09/07 0409) Last BM Date: 10/27/11  Intake/Output from previous day: 09/06 0701 - 09/07 0700 In: 920 [P.O.:720; IV Piggyback:200] Out: -  Intake/Output this shift: Total I/O In: 50 [IV Piggyback:50] Out: -   General appearance: alert, cooperative and no distress GI: soft, non-tender; bowel sounds normal; no masses,  no organomegaly  Lab Results:   [facility] 10/28/11 0859  WBC 5.3  HGB 14.0  HCT 41.3  PLT 107*   BMET  Basename 10/28/11 0859  NA 132*  K 3.7  CL 92*  CO2 32  GLUCOSE 109*  BUN 16  CREATININE 0.79  CALCIUM 9.0   PT/INR No results found for this basename: LABPROT:2,INR:2 in the last 72 hours  Studies/Results: US Abdomen Limited Ruq  10/27/2011  *RADIOLOGY REPORT*  Clinical Data:  Fever, right upper quadrant pain  LIMITED ABDOMINAL ULTRASOUND - RIGHT UPPER QUADRANT  Comparison:  10/25/2011 CT  Findings:  Gallbladder:  Prior cholecystectomy.  Small hypoechoic fluid collection along the gallbladder fossa measures 2.8 x 2.0 x 1.8 cm remains nonspecific.  Common bile duct:  5 mm diameter.  No dilatation obstruction  Liver:  Diffuse increased echogenicity compatible with fatty infiltration.  No intrapelvic biliary dilatation or focal hepatic abnormality.  IMPRESSION: Small hypoechoic nonspecific fluid collection along the gallbladder fossa status post cholecystectomy.  No biliary dilatation or obstruction  Hepatic steatosis                    Original Report Authenticated By: Judie Petit. Ruel Favors, M.D.     Anti-infectives: Anti-infectives     Start     Dose/Rate Route Frequency Ordered Stop   10/29/11 0000    ciprofloxacin (CIPRO) 500 MG tablet        500 mg Oral 2 times daily 10/29/11 0935 11/08/11 2359   10/28/11 2200  piperacillin-tazobactam (ZOSYN) IVPB 3.375 g       3.375 g 12.5 mL/hr over 240 Minutes Intravenous 3 times per day 10/28/11 1802     10/25/11 1300   cefTRIAXone (ROCEPHIN) 1 g in dextrose 5 % 50 mL IVPB  Status:  Discontinued        1 g 100 mL/hr over 30 Minutes Intravenous Every 24 hours 10/25/11 1153 10/28/11 1802   10/25/11 1200   azithromycin (ZITHROMAX) tablet 500 mg        500 mg Oral Daily 10/25/11 1153            Assessment/Plan: s/p Procedure(s): ESOPHAGOGASTRODUODENOSCOPY (EGD) Impression: Abdominal pain of unknown etiology, resolved. The only thing that significance was a nonspecific duodenitis which was not appreciated on EGD. Apparently, the patient did have a diverticulum within the duodenum. He was switched from Rocephin to Unasyn yesterday to help cover any type of GI infection. Would discharge home on ciprofloxacin for one week, covering any possible GI bacteria that could have been causing this. He does have a diastases recti, but this is not the source of his abdominal pain.  LOS: 5 days    Lilac Hoff A 10/29/2011

## 2011-10-30 ENCOUNTER — Encounter: Payer: Self-pay | Admitting: Internal Medicine

## 2011-10-31 ENCOUNTER — Encounter: Payer: Self-pay | Admitting: *Deleted

## 2011-11-01 ENCOUNTER — Telehealth: Payer: Self-pay

## 2011-11-01 MED ORDER — NYSTATIN 100000 UNIT/ML MT SUSP: 500000.0000 [IU] | Freq: Four times a day (QID) | OROMUCOSAL | Status: AC

## 2011-11-01 NOTE — Telephone Encounter (Signed)
Pt is aware of what LSL said. He is aware of new Rx at [facility]. I told him to call me in a week to see how he is doing. I also told him to take the Pantoprazole 20 mg BID until they was all gone then start the Pantoprazole 40 mg Daily.

## 2011-11-01 NOTE — Telephone Encounter (Signed)
Tried to call with no answer  

## 2011-11-01 NOTE — Telephone Encounter (Signed)
I suspect symptoms are related to numerous antibiotics that he has been on rather than EGD. Reviewed discharge medications from recent hospitalization.   I would recommend he be on pantoprazole 40mg  daily rather than 20mg  daily. If needs new rx let me know. Per d/c med list, it stated he was on 20mg . Please verify.  For mouth, treat for possible candida. Nystatin suspension rx sent to [facility] pharmacy.  Call with further problems.

## 2011-11-01 NOTE — Telephone Encounter (Signed)
Can you send in a Rx for the Pantroazole 40 mg Daily.    Thanks

## 2011-11-01 NOTE — Telephone Encounter (Signed)
Pt called with burning sensation in the roof of his mouth and on his tongue. He said that it did not start until after the EGD. He also said that the food taste bad and the smell of the burping is awful. He is taking the Carafate but what can do to help. Please advise/ He can be contacted at [phone]

## 2011-11-02 MED ORDER — PANTOPRAZOLE SODIUM 40 MG PO TBEC: 40.0000 mg | DELAYED_RELEASE_TABLET | Freq: Every day | ORAL | Status: DC

## 2011-11-02 NOTE — Telephone Encounter (Signed)
done

## 2012-01-06 ENCOUNTER — Telehealth: Payer: Self-pay | Admitting: Urgent Care

## 2012-01-06 ENCOUNTER — Ambulatory Visit (INDEPENDENT_AMBULATORY_CARE_PROVIDER_SITE_OTHER): Payer: Medicare Other | Admitting: Urology

## 2012-01-06 ENCOUNTER — Inpatient Hospital Stay (HOSPITAL_COMMUNITY)
Admission: AD | Admit: 2012-01-06 | Discharge: 2012-01-09 | DRG: 444 | Disposition: A | Discharge status: Home / Self Care | Payer: Medicare Other | Source: Ambulatory Visit | Attending: Family Medicine | Admitting: Family Medicine

## 2012-01-06 ENCOUNTER — Inpatient Hospital Stay (HOSPITAL_COMMUNITY): Payer: Medicare Other

## 2012-01-06 ENCOUNTER — Encounter (HOSPITAL_COMMUNITY): Payer: Self-pay | Admitting: Gastroenterology

## 2012-01-06 DIAGNOSIS — K449 Diaphragmatic hernia without obstruction or gangrene: Secondary | ICD-10-CM | POA: Diagnosis present

## 2012-01-06 DIAGNOSIS — R1011 Right upper quadrant pain: Secondary | ICD-10-CM

## 2012-01-06 DIAGNOSIS — E119 Type 2 diabetes mellitus without complications: Secondary | ICD-10-CM | POA: Diagnosis present

## 2012-01-06 DIAGNOSIS — Z85038 Personal history of other malignant neoplasm of large intestine: Secondary | ICD-10-CM

## 2012-01-06 DIAGNOSIS — D696 Thrombocytopenia, unspecified: Secondary | ICD-10-CM | POA: Diagnosis present

## 2012-01-06 DIAGNOSIS — Z800 Family history of malignant neoplasm of digestive organs: Secondary | ICD-10-CM

## 2012-01-06 DIAGNOSIS — K831 Obstruction of bile duct: Secondary | ICD-10-CM | POA: Diagnosis present

## 2012-01-06 DIAGNOSIS — Z833 Family history of diabetes mellitus: Secondary | ICD-10-CM

## 2012-01-06 DIAGNOSIS — R17 Unspecified jaundice: Secondary | ICD-10-CM

## 2012-01-06 DIAGNOSIS — R822 Biliuria: Secondary | ICD-10-CM

## 2012-01-06 DIAGNOSIS — K8309 Other cholangitis: Secondary | ICD-10-CM

## 2012-01-06 DIAGNOSIS — E876 Hypokalemia: Secondary | ICD-10-CM | POA: Diagnosis not present

## 2012-01-06 DIAGNOSIS — Z8546 Personal history of malignant neoplasm of prostate: Secondary | ICD-10-CM

## 2012-01-06 DIAGNOSIS — Z23 Encounter for immunization: Secondary | ICD-10-CM

## 2012-01-06 DIAGNOSIS — Z8249 Family history of ischemic heart disease and other diseases of the circulatory system: Secondary | ICD-10-CM

## 2012-01-06 DIAGNOSIS — Z860100 Personal history of colon polyps, unspecified: Secondary | ICD-10-CM

## 2012-01-06 DIAGNOSIS — M545 Low back pain, unspecified: Secondary | ICD-10-CM | POA: Diagnosis present

## 2012-01-06 DIAGNOSIS — I2510 Atherosclerotic heart disease of native coronary artery without angina pectoris: Secondary | ICD-10-CM | POA: Diagnosis present

## 2012-01-06 DIAGNOSIS — Z923 Personal history of irradiation: Secondary | ICD-10-CM

## 2012-01-06 DIAGNOSIS — G8929 Other chronic pain: Secondary | ICD-10-CM | POA: Diagnosis present

## 2012-01-06 DIAGNOSIS — Z832 Family history of diseases of the blood and blood-forming organs and certain disorders involving the immune mechanism: Secondary | ICD-10-CM

## 2012-01-06 DIAGNOSIS — K439 Ventral hernia without obstruction or gangrene: Secondary | ICD-10-CM | POA: Diagnosis present

## 2012-01-06 DIAGNOSIS — Z9089 Acquired absence of other organs: Secondary | ICD-10-CM

## 2012-01-06 DIAGNOSIS — K219 Gastro-esophageal reflux disease without esophagitis: Secondary | ICD-10-CM | POA: Diagnosis present

## 2012-01-06 DIAGNOSIS — R7989 Other specified abnormal findings of blood chemistry: Secondary | ICD-10-CM

## 2012-01-06 DIAGNOSIS — K589 Irritable bowel syndrome without diarrhea: Secondary | ICD-10-CM | POA: Diagnosis present

## 2012-01-06 DIAGNOSIS — M129 Arthropathy, unspecified: Secondary | ICD-10-CM | POA: Diagnosis present

## 2012-01-06 LAB — COMPREHENSIVE METABOLIC PANEL
ALT: 343 U/L — ABNORMAL HIGH (ref 0–53)
AST: 224 U/L — ABNORMAL HIGH (ref 0–37)
Alkaline Phosphatase: 193 U/L — ABNORMAL HIGH (ref 39–117)
CO2: 29 mEq/L (ref 19–32)
Calcium: 9.7 mg/dL (ref 8.4–10.5)
Chloride: 98 mEq/L (ref 96–112)
GFR calc Af Amer: >90 mL/min (ref >90)
GFR calc non Af Amer: 82 mL/min — ABNORMAL LOW (ref >90)
Glucose, Bld: 121 mg/dL — ABNORMAL HIGH (ref 70–99)
Potassium: 4 mEq/L (ref 3.5–5.1)
Sodium: 136 mEq/L (ref 135–145)
Total Bilirubin: 5.9 mg/dL — ABNORMAL HIGH (ref 0.3–1.2)

## 2012-01-06 LAB — CBC WITH DIFFERENTIAL/PLATELET
Basophils Absolute: 0 10*3/uL (ref 0.0–0.1)
Basophils Relative: 0 % (ref 0–1)
Eosinophils Absolute: 0 10*3/uL (ref 0.0–0.7)
Hemoglobin: 14.7 g/dL (ref 13.0–17.0)
MCH: 30.4 pg (ref 26.0–34.0)
MCHC: 33.3 g/dL (ref 30.0–36.0)
Monocytes Relative: 11 % (ref 3–12)
Neutro Abs: 4.4 10*3/uL (ref 1.7–7.7)
Neutrophils Relative %: 78 % — ABNORMAL HIGH (ref 43–77)
Platelets: 134 10*3/uL — ABNORMAL LOW (ref 150–400)
RDW: 13.8 % (ref 11.5–15.5)

## 2012-01-06 LAB — LIPASE, BLOOD: Lipase: 47 U/L (ref 11–59)

## 2012-01-06 MED ORDER — IOHEXOL 300 MG/ML  SOLN
100.0000 mL | Freq: Once | INTRAMUSCULAR | Status: AC | PRN
  Administered 2012-01-06: 100 mL via INTRAVENOUS

## 2012-01-06 MED ORDER — PANTOPRAZOLE SODIUM 40 MG PO TBEC
40.0000 mg | DELAYED_RELEASE_TABLET | Freq: Every day | ORAL | Status: DC
  Administered 2012-01-06 – 2012-01-09 (×4): 40 mg via ORAL
  Filled 2012-01-06 (×4): qty 1

## 2012-01-06 MED ORDER — ACETAMINOPHEN 325 MG PO TABS
650.0000 mg | ORAL_TABLET | Freq: Four times a day (QID) | ORAL | Status: DC | PRN
  Administered 2012-01-06: 650 mg via ORAL
  Filled 2012-01-06 (×2): qty 2

## 2012-01-06 MED ORDER — ACETAMINOPHEN 325 MG PO TABS
ORAL_TABLET | ORAL | Status: AC
  Administered 2012-01-06: 650 mg
  Filled 2012-01-06: qty 2

## 2012-01-06 MED ORDER — SODIUM CHLORIDE 0.9 % IV SOLN
3.0000 g | Freq: Four times a day (QID) | INTRAVENOUS | Status: DC
  Administered 2012-01-06 – 2012-01-09 (×11): 3 g via INTRAVENOUS
  Filled 2012-01-06 (×16): qty 3

## 2012-01-06 MED ORDER — SUCRALFATE 1 GM/10ML PO SUSP: 1.0000 g | Freq: Three times a day (TID) | ORAL | Status: DC

## 2012-01-06 MED ORDER — SODIUM CHLORIDE 0.9 % IV SOLN
INTRAVENOUS | Status: DC
  Administered 2012-01-06 – 2012-01-08 (×4): via INTRAVENOUS

## 2012-01-06 MED ORDER — SODIUM CHLORIDE 0.9 % IV SOLN: INTRAVENOUS | Status: DC

## 2012-01-06 MED ORDER — SIMETHICONE 80 MG PO CHEW
160.0000 mg | CHEWABLE_TABLET | Freq: Four times a day (QID) | ORAL | Status: DC | PRN
  Administered 2012-01-07: 160 mg via ORAL
  Filled 2012-01-06: qty 2

## 2012-01-06 NOTE — Progress Notes (Signed)
I am seeing patient for Dr. Darrick Penna as I am covering for their patients. I have reviewed patient's lab studies and recent ultrasound and CT from earlier today. Intrahepatic. Radicles appear to be prominent CBD is nondilated. Patient presents with right upper quadrant pain fever and jaundice. Is present patient is typical of acute cholangitis. He possibly has choledocholithiasis. Will proceed with diagnostic/therapeutic ERCP in am. Patient is agreeable to proceed with the study to be done in OR at 8 AM 01/07/2012.

## 2012-01-06 NOTE — Consult Note (Signed)
PT ADMITTED DUE TO CHILLS, AND JAUNDICE. ELEVATED ALT >AST AND BILIRUBIN(1oLY DIRECT). AWAIT CT. ADD UNASYN. MIVFs. CLEAR LIQUIDS AFTER CT. PROTONIX DAILY.

## 2012-01-06 NOTE — Telephone Encounter (Signed)
CALLED BY DR. Annabell Howells. PT HAS BILIRUBINURIA. CALLED DR. MCINNIS. ARRANGED FOR ADMISSION. ADMIT FOR IVFs, CT,. AND LABS.

## 2012-01-06 NOTE — Progress Notes (Signed)
ANTIBIOTIC CONSULT NOTE - INITIAL  Pharmacy Consult for Unasyn Indication: bilirubinuria  No Known Allergies  Patient Measurements:    Vital Signs: Temp: 99 F (37.2 C) (11/15 1217) Temp src: Oral (11/15 1217) BP: 175/84 mmHg (11/15 1217) Pulse Rate: 77  (11/15 1217) Intake/Output from previous day:   Intake/Output from this shift:    Labs:  Basename 01/06/12 1228  WBC 5.6  HGB 14.7  PLT 134*  LABCREA --  CREATININE 0.92   The CrCl is unknown because both a height and weight (above a minimum accepted value) are required for this calculation. No results found for this basename: VANCOTROUGH:2,VANCOPEAK:2,VANCORANDOM:2,GENTTROUGH:2,GENTPEAK:2,GENTRANDOM:2,TOBRATROUGH:2,TOBRAPEAK:2,TOBRARND:2,AMIKACINPEAK:2,AMIKACINTROU:2,AMIKACIN:2, in the last 72 hours   Microbiology: No results found for this or any previous visit (from the past 720 hour(s)).  Medical History: Past Medical History  Diagnosis Date  . Arthritis   . Numbness     left leg after back surgery  . Chronic back pain   . GERD (gastroesophageal reflux disease)   . Hx of adenomatous colonic polyps   . Schatzki's ring     History of  . Helicobacter pylori gastritis     Treated  . IBS (irritable bowel syndrome)   . Prostate ca     seed implants    Medications:  Scheduled:    . ampicillin-sulbactam (UNASYN) IV  3 g Intravenous Q6H  . pantoprazole  40 mg Oral QAC breakfast  . sucralfate  1 g Oral TID WC & HS   Assessment: 71 yo M with bilirubinuria to start on Unasyn. Renal function at baseline.   Goal of Therapy:  Eradicate infection.  Plan:  1) Unasyn 3gm IV q6h 2) Monitor renal function and cx data   Elson Clan 01/06/2012,1:51 PM

## 2012-01-06 NOTE — Consult Note (Signed)
Referring Provider: Alice Reichert, MD Primary Care Physician:  Alice Reichert, MD Primary Gastroenterologist:  Roetta Sessions, MD  Reason for Consultation:  bilirubinuria  HPI: [name] is a 72 y.o. male admitted today as direct admission for bilirubinuria. Patient presented to Dr. Annabell Howells, urologist, for gross hematuria which actually turned out to be bilirubinuria. We were contacted for further assistance and Dr. Renard Matter was kind enough to assist in a direct admission for this patient.    He history is quite complicated. In 2007, he underwent ERCP/ES/stone extraction for CBD stone. He had cholecystectomy for gangrenous gb, complicated by post-op abscess requiring percutaneous drainage. He reports that he never fully recovered and has had ongoing RUQ pain since.  In 10/2011, he presented with severe RUQ/epigastric pain which was never clearing understood. He also developed fever during hospitalization. His work-up included CTA chest/abd/pelvis (10/24/11) which other than mild scattered atherosclerotic disease was unremarkable. On 10/25/11 he had CT Abd which showed new 1.8cm fluid collection at gb bed not present on CT in 2012. There was surrounding soft tissue stranding between the gb bed and adjacent duodenum (per radiologist ?PUD or duodenitis), duodenum diverticula (2nd portion) was unremarkable without inflammation. He had EGD 10/2011 which showed diffuse gastric submucosal petechia, juxta ampullary duodenal diverticula and some mucosal edema involving 1st/2nd portion of duodenum. Bx from both stomach and duodenum showed benign superficial ulceration without H.Pylori. Findings were not as impressive as CT findings. Patient treated empirically with antibiotics and eventually became afebrile and discharged without clear explanation for cause of symptoms. LFTs were unremarkable except for Gilbert's disease at that time. He also had mild thrombocytopenia during that hospital stay as well.   Patient states  he has never been right since his gallbladder surgery. He continues to have intermittent epigastric/RUQ/Right back pain which is quite severe at times. He c/o early satiety and poor appetite. He c/o fatigue which is unusual for him. He wants to just lay around all the time. Three days ago he noted what he thought was gross hematuria. He states his urine was pure red. He saw Dr. Annabell Howells today who reported that he urine did not have blood but had bilirubin in it. Due to the right back/flank pain patient thought he had a kidney stone. Chronically he has lots of belching/burning with it. Stop pantoprazole awhile ago. Not on any medications at this time. He report cold chills last night. Nausea but not vomiting. Little po intake, poor appetite. BM regular lately. No melena, brbpr. He reports unspecified amount of weight loss. Clothing feels looser.   Medications prior to arrival: NONE  Current Facility-Administered Medications  Medication Dose Route Frequency Provider Last Rate Last Dose  . 0.9 %  sodium chloride infusion   Intravenous Continuous Walter Kocher, PA      . pantoprazole (PROTONIX) EC tablet 40 mg  40 mg Oral QAC breakfast West Bali, MD        Allergies as of 01/06/2012  . (No Known Allergies)    Past Medical History  Diagnosis Date  . Arthritis   . Numbness     left leg after back surgery  . Chronic back pain   . GERD (gastroesophageal reflux disease)   . Hx of adenomatous colonic polyps   . Schatzki's ring     History of  . Helicobacter pylori gastritis     Treated  . IBS (irritable bowel syndrome)   . Prostate ca     seed implants  Past Surgical History  Procedure Date  . Appendectomy age 21  . Back surgery 02/08    [facility]  . Back surgery 09/24/04    lumbar, [facility]  . Back surgery 09/18/02    [facility]  . Cholecystectomy 03/04/05    [facility], [facility]. Gangrenous cholecystitis complicated by abscess requiring percutaneous drainage. He also had common duct stones requiring  ERCP and sphincterotomy.  . Ercp 03/02/05    [facility], [facility]. Normal-appearing biliary tree (gallbladder not image), status post sphincterotomy with recovery of small pieces of stone material, status post balloon occlusion cholangiogram.  . Cataract extraction w/phaco 11/22/2010    Procedure: CATARACT EXTRACTION PHACO AND INTRAOCULAR LENS PLACEMENT (IOC);  Surgeon: Susa Simmonds;  Location: [facility];  Service: Ophthalmology;  Laterality: Right;  CDE: 6.81  . Esophagogastroduodenoscopy August 2005    Erosive esophagitis and Schatzki ring, small hiatal hernia  . Colonoscopy August 2005    Scattered sigmoid diverticulosis, splenic flexure polyp. Hyperplastic  . Colonoscopy 1997    3 cm tubular adenoma and a sending colon  . Incomplete colonoscopy December 2009    Left-sided diverticula, mid descending colon polyp, due to recurrent looping and redundancy exam was incomplete. It was felt that the mid colon was reached. Followup barium enema showed colon interposition between the liver and the diaphragm, redundant sigmoid colon but no colon mass or polyp identified.. Pathology revealed tubular adenoma.  . Esophagogastroduodenoscopy 01/2008    Mild reflux esophagitis, small hiatal hernia  . Colonoscopy 02/01/2011    Rourk-friable anal canal, hyperplastic rectal polyp  . Esophagogastroduodenoscopy 02/01/2011    Rourk-erosive reflux esophagitis, small hiatal hernia  . Esophagogastroduodenoscopy 10/26/2011    Dr. Kerby Moors gastric submucosal petechia (bx-benign ulceration), juxta ampullary duodenal diverticulum and some mucosal edema involving 1st/2nd portion of duodenum with superficial erosions (bx-superficial ulceration/benign)    Family History  Problem Relation Age of Onset  . Hypotension Neg Hx   . Anesthesia problems Neg Hx   . Malignant hyperthermia Neg Hx   . Pseudochol deficiency Neg Hx   . Diabetes Mother   . Coronary artery disease Father   . Coronary artery disease Mother   .  Diabetes Father   . Stomach cancer Father   . Colon cancer Neg Hx   . Deep vein thrombosis Daughter     History   Social History  . Marital Status: Married    Spouse Name: N/A    Number of Children: 2  . Years of Education: N/A   Occupational History  . Retired IT sales professional    Social History Main Topics  . Smoking status: Never Smoker   . Smokeless tobacco: Not on file  . Alcohol Use: No  . Drug Use: No  . Sexually Active: Not on file   Other Topics Concern  . Not on file   Social History Narrative  . No narrative on file     ROS:  General: see hpi. Eyes: Negative for vision changes.  ENT: Negative for hoarseness, difficulty swallowing , nasal congestion. CV: Negative for angina, palpitations, dyspnea on exertion, peripheral edema. eigastric pain into his chest Respiratory: Negative for dyspnea at rest, dyspnea on exertion, cough, sputum, wheezing.  GI: See history of present illness. GU:  See hpi MS: Negative for joint pain, low back pain.  Derm: Negative for rash or itching.  Neuro: Negative for weakness, abnormal sensation, seizure, frequent headaches, memory loss, confusion.  Psych: Negative for anxiety, depression, suicidal ideation, hallucinations.  Endo: see hpi Heme: Negative for bruising or  bleeding. Allergy: Negative for rash or hives.       Physical Examination: Vital signs in last 24 hours: Temp:  [99 F (37.2 C)] 99 F (37.2 C) (11/15 1217) Pulse Rate:  [77] 77  (11/15 1217) Resp:  [20] 20  (11/15 1217) BP: (175)/(84) 175/84 mmHg (11/15 1217) SpO2:  [96 %] 96 % (11/15 1217)    General: Jaundiced appearing WM in no acute distress. Accompanied by wife.  Head: Normocephalic, atraumatic.   Eyes: Conjunctiva pink, scleral icterus. Mouth: Oropharyngeal mucosa moist and pink , no lesions erythema or exudate. Neck: Supple without thyromegaly, masses, or lymphadenopathy.  Lungs: Clear to auscultation bilaterally.  Heart: Regular rate and rhythm, no  murmurs rubs or gallops.  Abdomen: Bowel sounds are normal, nondistended, no hepatosplenomegaly or masses, no abdominal bruits or hernia , no rebound or guarding.  Nontender. No CVA tenderness. Rectal: not performed Extremities: Trace lower extremity edema, clubbing, deformity.  Neuro: Alert and oriented x 4 , grossly normal neurologically.  Skin: Warm and dry, no rash. Patient is jaundice.   Psych: Alert and cooperative, normal mood and affect.        Intake/Output from previous day:   Intake/Output this shift:    Lab Results: CBC  Basename 01/06/12 1228  WBC 5.6  HGB 14.7  HCT 44.1  MCV 91.3  PLT 134*   BMET  Basename 01/06/12 1228  NA 136  K 4.0  CL 98  CO2 29  GLUCOSE 121*  BUN 13  CREATININE 0.92  CALCIUM 9.7   LFT  Basename 01/06/12 1228  BILITOT 5.9*  BILIDIR --  IBILI --  ALKPHOS 193*  AST 224*  ALT 343*  PROT 8.0  ALBUMIN 3.6   Impression: 71 y/o male presents with jaundice. Complicated history as outlined above. Continues to have chronic frequent episodes of epigastric/RUQ/right flank-back pain since his cholecystectomy in 2007. Biliary obstruction of unclear etiology at this point. Patient needs further imaging via CT Abd/pelvis with contrast.  Plan: 1. Keep NPO for now. 2. Start IV fluids. 3. CT A/P with contrast. 4. Direct bilirubin and lipase add-on. 5. Start Unasyn per pharmacy consult 6. LFTs in AM.  I would like to thank Dr. Renard Matter for allowing [place] to take part in the care of this nice patient.    LOS: 0 days   Tana Coast  01/06/2012, 1:37 PM

## 2012-01-06 NOTE — Telephone Encounter (Signed)
Call from Dr Annabell Howells.  Pt has been evaluated today for gross hematuria, but actually has bilirubinuria & abdominal pain.  Hx GB bed abscess & hospitalization Sept 2013 followed by [facility].  Wanted to discuss w/ MD on call.  Dr Darrick Penna pager # given in Dr Rourk's absence.

## 2012-01-07 ENCOUNTER — Inpatient Hospital Stay (HOSPITAL_COMMUNITY): Payer: Medicare Other | Admitting: Anesthesiology

## 2012-01-07 ENCOUNTER — Encounter (HOSPITAL_COMMUNITY): Payer: Self-pay | Admitting: Anesthesiology

## 2012-01-07 ENCOUNTER — Encounter (HOSPITAL_COMMUNITY)
Admission: AD | Disposition: A | Discharge status: Home / Self Care | Payer: Self-pay | Source: Ambulatory Visit | Attending: Family Medicine

## 2012-01-07 ENCOUNTER — Inpatient Hospital Stay (HOSPITAL_COMMUNITY): Payer: Medicare Other

## 2012-01-07 DIAGNOSIS — K831 Obstruction of bile duct: Secondary | ICD-10-CM

## 2012-01-07 DIAGNOSIS — K838 Other specified diseases of biliary tract: Secondary | ICD-10-CM

## 2012-01-07 HISTORY — PX: SPHINCTEROTOMY: SHX5544

## 2012-01-07 HISTORY — PX: ERCP: SHX5425

## 2012-01-07 HISTORY — PX: BILIARY STENT PLACEMENT: SHX5538

## 2012-01-07 LAB — SURGICAL PCR SCREEN
MRSA, PCR: NEGATIVE
Staphylococcus aureus: NEGATIVE

## 2012-01-07 LAB — HEPATIC FUNCTION PANEL
Albumin: 3.2 g/dL — ABNORMAL LOW (ref 3.5–5.2)
Bilirubin, Direct: 1.5 mg/dL — ABNORMAL HIGH (ref 0.0–0.3)
Indirect Bilirubin: 2.1 mg/dL — ABNORMAL HIGH (ref 0.3–0.9)
Total Bilirubin: 3.6 mg/dL — ABNORMAL HIGH (ref 0.3–1.2)

## 2012-01-07 LAB — PROTIME-INR
INR: 1.14 (ref 0.00–1.49)
Prothrombin Time: 14.4 seconds (ref 11.6–15.2)

## 2012-01-07 SURGERY — ERCP, WITH INTERVENTION IF INDICATED
Anesthesia: General

## 2012-01-07 MED ORDER — SUCCINYLCHOLINE CHLORIDE 20 MG/ML IJ SOLN
INTRAMUSCULAR | Status: AC
  Filled 2012-01-07: qty 1

## 2012-01-07 MED ORDER — MIDAZOLAM HCL 2 MG/2ML IJ SOLN
INTRAMUSCULAR | Status: AC
  Filled 2012-01-07: qty 2

## 2012-01-07 MED ORDER — GLYCOPYRROLATE 0.2 MG/ML IJ SOLN
INTRAMUSCULAR | Status: DC | PRN
  Administered 2012-01-07: 0.2 mg via INTRAVENOUS

## 2012-01-07 MED ORDER — MIDAZOLAM HCL 5 MG/5ML IJ SOLN
INTRAMUSCULAR | Status: DC | PRN
  Administered 2012-01-07: 2 mg via INTRAVENOUS

## 2012-01-07 MED ORDER — ACETAMINOPHEN 650 MG RE SUPP
650.0000 mg | Freq: Four times a day (QID) | RECTAL | Status: DC | PRN
  Administered 2012-01-07: 650 mg via RECTAL
  Filled 2012-01-07: qty 1

## 2012-01-07 MED ORDER — FENTANYL CITRATE 0.05 MG/ML IJ SOLN
INTRAMUSCULAR | Status: AC
  Filled 2012-01-07: qty 2

## 2012-01-07 MED ORDER — STERILE WATER FOR IRRIGATION IR SOLN
Status: DC | PRN
  Administered 2012-01-07: 09:00:00

## 2012-01-07 MED ORDER — FENTANYL CITRATE 0.05 MG/ML IJ SOLN
INTRAMUSCULAR | Status: DC | PRN
  Administered 2012-01-07: 50 ug via INTRAVENOUS
  Administered 2012-01-07: 100 ug via INTRAVENOUS

## 2012-01-07 MED ORDER — PROPOFOL 10 MG/ML IV EMUL
INTRAVENOUS | Status: AC
  Filled 2012-01-07: qty 20

## 2012-01-07 MED ORDER — GLYCOPYRROLATE 0.2 MG/ML IJ SOLN
INTRAMUSCULAR | Status: AC
  Filled 2012-01-07: qty 1

## 2012-01-07 MED ORDER — LIDOCAINE HCL (CARDIAC) 10 MG/ML IV SOLN
INTRAVENOUS | Status: DC | PRN
  Administered 2012-01-07: 50 mg via INTRAVENOUS

## 2012-01-07 MED ORDER — PROPOFOL 10 MG/ML IV BOLUS
INTRAVENOUS | Status: DC | PRN
  Administered 2012-01-07: 150 mg via INTRAVENOUS

## 2012-01-07 MED ORDER — SODIUM CHLORIDE 0.9 % IV SOLN
3.0000 g | INTRAVENOUS | Status: DC | PRN
  Administered 2012-01-07: 3 g via INTRAVENOUS

## 2012-01-07 MED ORDER — SODIUM CHLORIDE 0.9 % IV SOLN
INTRAVENOUS | Status: DC | PRN
  Administered 2012-01-07: 09:00:00

## 2012-01-07 MED ORDER — GLUCAGON HCL (RDNA) 1 MG IJ SOLR
INTRAMUSCULAR | Status: DC | PRN
  Administered 2012-01-07 (×4): 0.25 mg via INTRAVENOUS

## 2012-01-07 MED ORDER — LIDOCAINE HCL (PF) 1 % IJ SOLN
INTRAMUSCULAR | Status: AC
  Filled 2012-01-07: qty 5

## 2012-01-07 MED ORDER — LACTATED RINGERS IV SOLN
INTRAVENOUS | Status: DC | PRN
  Administered 2012-01-07 (×2): via INTRAVENOUS

## 2012-01-07 MED ORDER — SUCCINYLCHOLINE CHLORIDE 20 MG/ML IJ SOLN
INTRAMUSCULAR | Status: DC | PRN
  Administered 2012-01-07: 120 mg via INTRAVENOUS

## 2012-01-07 SURGICAL SUPPLY — 27 items
BAG HAMPER (MISCELLANEOUS) ×2 IMPLANT
BALLN RETRIEVAL 12X15 (BALLOONS) ×1 IMPLANT
BALN RTRVL 200 6-7FR 12-15 (BALLOONS) ×1
BASKET TRAPEZOID 3X6 (MISCELLANEOUS) IMPLANT
BSKT STON RTRVL TRAPEZOID 3X6 (MISCELLANEOUS)
DEVICE INFLATION ENCORE 26 (MISCELLANEOUS) IMPLANT
DEVICE LOCKING W-BIOPSY CAP (MISCELLANEOUS) ×2 IMPLANT
GUIDEWIRE HYDRA JAGWIRE .35 (WIRE) ×1 IMPLANT
GUIDEWIRE JAG HINI 025X260CM (WIRE) IMPLANT
KIT ROOM TURNOVER APOR (KITS) ×2 IMPLANT
LUBRICANT JELLY 4.5OZ STERILE (MISCELLANEOUS) ×1 IMPLANT
NDL HYPO 18GX1.5 BLUNT FILL (NEEDLE) IMPLANT
NEEDLE HYPO 18GX1.5 BLUNT FILL (NEEDLE) IMPLANT
PAD ARMBOARD 7.5X6 YLW CONV (MISCELLANEOUS) ×3 IMPLANT
PATHFINDER 450CM 0.18 (STENTS) IMPLANT
POSITIONER HEAD 8X9X4 ADT (SOFTGOODS) IMPLANT
SNARE ROTATE MED OVAL 20MM (MISCELLANEOUS) ×1 IMPLANT
SPHINCTEROTOME AUTOTOME .25 (MISCELLANEOUS) IMPLANT
SPHINCTEROTOME HYDRATOME 44 (MISCELLANEOUS) ×2 IMPLANT
SPONGE GAUZE 4X4 12PLY (GAUZE/BANDAGES/DRESSINGS) ×2 IMPLANT
SYR 3ML LL SCALE MARK (SYRINGE) IMPLANT
SYR 50ML LL SCALE MARK (SYRINGE) ×1 IMPLANT
SYSTEM CONTINUOUS INJECTION (MISCELLANEOUS) ×2 IMPLANT
TUBING ENDO SMARTCAP PENTAX (MISCELLANEOUS) ×2 IMPLANT
WALLSTENT METAL COVERED 10X60 (STENTS) IMPLANT
WALLSTENT METAL COVERED 10X80 (STENTS) IMPLANT
WATER STERILE IRR 1000ML POUR (IV SOLUTION) ×2 IMPLANT

## 2012-01-07 NOTE — H&P (Signed)
NAMEMarland [name]  MAKAHI, BELLOWS NO.:  [id number]  MEDICAL RECORD NO.:  [id number]  LOCATION:                                 FACILITY:  PHYSICIAN:  Atziri Zubiate G. Renard Matter, MD        DATE OF BIRTH:  DATE OF ADMISSION: DATE OF DISCHARGE:  LH                             HISTORY & PHYSICAL   This 71 year old male patient was admitted to the hospital with acute jaundice, epigastric, and right upper quadrant pain.  This patient states that he had seen Dr. Alycia Rossetti, of Urology on Tuesday, primarily because of his prostate cancer which had been previously treated with seed implantation and he also had ultrasound done 1 day later.  He went back and saw Dr. Alycia Rossetti again because of dark appearing urine, which had been present for several days.  Dr. Alycia Rossetti had him admitted to the hospital and had been seen by Dr. Darrick Penna.  He apparently had a cholecystectomy in 2007, for gangrenous gallbladder and had percutaneous drainage following this.  An ERCP with stone extraction in 2007.  The patient had another hospitalization here in September, where he was evaluated for abdominal pain, obstipation, ventral hernia, small hiatal hernia.  Gastric erosions were noted.  The patient has had some nausea and continued low-grade fever, and was noted to have dark urine several days ago which was thought to be bilirubin.  He had reported cold, chills, and nausea but no vomiting.  He had very poor appetite over the past few days and is thought to have cholangitis.  He was admitted and placed on antibiotics intravenously.  SOCIAL HISTORY:  The patient does not smoke or drink alcohol.  FAMILY HISTORY:  Positive for hypotension, diabetes, coronary artery disease, stomach cancer, colon cancer, deep vein thrombosis.  PAST MEDICAL HISTORY:  The patient has prior history of arthritis, chronic low back pain, GERD, history of adenomatous colon polyps. Schatzki's ring, history of H. pylori gastritis, history of  irritable bowel syndrome.  History of prostate cancer with seed implants.  PAST SURGICAL HISTORY:  He has a prior history of appendectomy at age 26, previous history of back surgery in 2004, cholecystectomy by Dr. Lovell Sheehan in 2007.  He had common duct stones.  An ERCP following this. Cataract extraction.  Esophagogastroduodenoscopy in August of 2005. Erosive esophagitis and Schatzki's ring.  Small hiatal hernia. Colonoscopy in 2005.  Sigmoid diverticulosis.  Colonoscopy in 2012, hyperplastic rectal polyps.  Esophagogastroduodenoscopy in 2012, small hiatal hernia.  REVIEW OF SYSTEMS:  HEENT:  Negative.  CARDIOPULMONARY:  No cough, hemoptysis, or dyspnea.  GI:  The patient has had epigastric pain and right upper quadrant pain, some nausea.  No vomiting.  No diarrhea.  No melena.  GU:  The patient has had dark appearing urine, but no dysuria.  PHYSICAL EXAMINATION:  GENERAL:  Alert appearing male. VITAL SIGNS:  Blood pressure 175/94, respirations 20, pulse 77, temp 99. HEENT:  Eyes PERRLA and icteric appearing sclerae.  Oropharynx mucosa normal. NECK:  Supple.  No JVD or thyroid abnormalities. HEART:  Regular rhythm.  No murmurs. LUNGS:  Clear to P and A. ABDOMEN:  The patient does have a  ventral hernia, and she does have tenderness in epigastrium and right upper quadrant tenderness.  No organomegaly. EXTREMITIES:  Free of edema. NEUROLOGIC:  The patient is alert and oriented.  Has no motor or sensory abnormalities. SKIN:  The patient is obviously jaundiced.  ASSESSMENT:  The patient was admitted with acute onset of jaundice with bilirubinuria.  He does have abdominal pain, epigastric and right upper quadrant, and probably has cholangitis.  PLAN:  To obtain further Gastroenterology consult.  The patient was placed on the Unasyn intravenously.  We will discuss this with the gastroenterologist on call.     Ko Bardon G. Renard Matter, MD     AGM/MEDQ  D:  01/06/2012  T:  01/07/2012   Job:  [id number]

## 2012-01-07 NOTE — Progress Notes (Signed)
Subjective: At the time of this visit the patient was feeling some better. He has been jaundiced and has run low-grade fever remains on IV antibiotics. He was taken down for ERCP today and had biliary sphincterotomy and biliary stenting. He tolerated the procedure in satisfactory manner.  Objective: Vital signs in last 24 hours: Temp:  [97.8 F (36.6 C)-102.8 F (39.3 C)] 98.2 F (36.8 C) (11/16 1145) Pulse Rate:  [80-108] 84  (11/16 1145) Resp:  [16-26] 18  (11/16 1145) BP: (139-164)/(67-88) 139/74 mmHg (11/16 1145) SpO2:  [90 %-100 %] 94 % (11/16 1145) Weight:  [110.36 kg (243 lb 4.8 oz)] 110.36 kg (243 lb 4.8 oz) (11/16 0507) Weight change:  Last BM Date: 01/05/12  Intake/Output from previous day: 11/15 0701 - 11/16 0700 In: 1268.3 [I.V.:1068.3; IV Piggyback:200] Out: 1250 [Urine:1250] Intake/Output this shift: Total I/O In: 1300 [I.V.:1300] Out: -   Physical Exam: General appearance-the patient appears somewhat jaundiced is alert and oriented.  HEENT negative with the exception of icteric sclerae  Lungs clear to P&A  Heart regular rhythm no murmurs  Abdomen patient has a ventral hernia which is easily reducible. He also has some tenderness in right upper quadrant and epigastrium.  Extremities free of edema   Basename 01/06/12 1228  WBC 5.6  HGB 14.7  HCT 44.1  PLT 134*   BMET  Basename 01/06/12 1228  NA 136  K 4.0  CL 98  CO2 29  GLUCOSE 121*  BUN 13  CREATININE 0.92  CALCIUM 9.7    Studies/Results: Ct Abdomen Pelvis W Contrast  01/06/2012  *RADIOLOGY REPORT*  Clinical Data: Right abdominal pain.  History of cholecystectomy.  CT ABDOMEN AND PELVIS WITH CONTRAST  Technique:  Multidetector CT imaging of the abdomen and pelvis was performed following the standard protocol during bolus administration of intravenous contrast.  Contrast: OMNIPAQUE IOHEXOL 300 MG/ML  SOLN  Comparison: Abdominal CT 10/25/2011.  Findings: There is chronic elevation of  the right hemidiaphragm associated with improved linear atelectasis at the right lung base. The left lung base is clear.  There is no pleural effusion. Coronary artery calcifications are noted.  There is mild intrahepatic biliary dilatation status post cholecystectomy.  The previous reported fluid collection within the cholecystectomy bed appears smaller.  On the reformatted images, this is tubular in shape and could reflect a dilated cystic duct remnant.  Its walls remain slightly thickened.  The surrounding inflammatory changes have improved, although have not completely resolved.  There is no extrahepatic biliary dilatation.  The liver contours are stable.  There are no focal liver lesions.  There is no evidence of pancreatic mass or pancreatic ductal dilatation.  The spleen, adrenal glands and kidneys appear stable.  There is a duodenal diverticulum without evidence of inflammation.  Colonic interposition between the liver and right hemidiaphragm appears stable.  There is no evidence of bowel obstruction. Scattered vascular calcifications are stable.  The urinary bladder, prostate gland and seminal vesicles are unchanged status post brachytherapy.  There is extensive thoracolumbar spondylosis status post lumbar fusion.  No acute osseous findings are identified.  IMPRESSION:  1.  Improving inflammatory process in the right upper quadrant of undetermined etiology status post remote cholecystectomy.  There is mild intrahepatic biliary dilatation, and a tubular fluid collection in the cholecystectomy bed could reflect distension of the cystic duct remnant. MRCP should be considered to exclude choledocholithiasis in this patient with an elevated bilirubin level. 2.  The extrahepatic biliary system does not appear significantly dilated.  The pancreatic head and adjacent duodenal diverticulum appear stable. 3.  Otherwise stable examination.   Original Report Authenticated By: Carey Bullocks, M.D.    Dg Ercp With  Sphincterotomy  01/07/2012  *RADIOLOGY REPORT*  Clinical Data: And cholangitis.  Jaundice.  ERCP abdomen and pelvis CT, 01/06/2012  Comparison:  None.  Technique:  Multiple spot images obtained with the fluoroscopic device and submitted for interpretation post-procedure.  .  Findings: Injection of the common bile duct shows the duct be normal in caliber and smooth.  There is no intrahepatic bile duct dilation.  No filling defects are visualized to suggest a duct stone.  Report leak, a sphincterotomy was performed.  Follow up imaging shows a common bile duct stent.  IMPRESSION: ERCP with sphincterotomy and stent placement.  These images were submitted for radiologic interpretation only. Please see the procedural report for the amount of contrast and the fluoroscopy time utilized.   Original Report Authenticated By: Amie Portland, M.D.     Medications:     . [COMPLETED] acetaminophen      . ampicillin-sulbactam (UNASYN) IV  3 g Intravenous Q6H  . pantoprazole  40 mg Oral QAC breakfast  . [DISCONTINUED] sucralfate  1 g Oral TID WC & HS       . sodium chloride 100 mL/hr at 01/07/12 1130  . [DISCONTINUED] sodium chloride       Assessment/Plan: The patient has high-grade stricture at the junction of the common hepatic and common bile duct to  Large periampullary diverticulum  Cholangitis the  Plan to continue current IV antibiotics for 48 hours  Patient will have to have an elective ERCP in approximately 8 weeks according to gastroenterologist   LOS: 1 day   Jailin Moomaw G 01/07/2012, 12:38 PM

## 2012-01-07 NOTE — Op Note (Signed)
NAMEGEROD, CALIGIURI NO.:  [id number]  MEDICAL RECORD NO.:  [id number]  LOCATION:                                 FACILITY:  PHYSICIAN:  Lionel December, M.D.    DATE OF BIRTH:  [birth year]  DATE OF PROCEDURE:  01/07/2012 DATE OF DISCHARGE:                               PROCEDURE NOTE   PROCEDURE:  ERCP with extension of biliary sphincterotomy and biliary stenting.  INDICATION:  The patient is a 71 year old Caucasian male, who has acute cholangitis.  He has been experiencing intermittent pain for several months, but prior evaluations have been negative for transaminitis or dilated ducts.  Now, he presents with jaundice, fever and right upper quadrant pain, an intrahepatic biliary radicals are more prominent than they were on prior study a few months ago.  The patient is undergoing diagnostic/therapeutic ERCP.  Procedure risks were reviewed with the patient and informed consent was obtained.  MEDS FOR SEDATION/ANESTHESIA:  Please see anesthesia records for details.  FINDINGS:  Procedure performed in the OR.  Once general endotracheal anesthesia was induced, the patient was turned into semi-prone position. Time-out was carried out.  Therapeutic Pentax video duodenum scope was passed via oropharynx without any difficulty into esophagus and across the stomach and pylorus into bulb and descending duodenum.  Large periampullary diverticulum was noted.  Ampulla was located towards lower end of the right wall. Biliary and pancreatic orifice were separated secondary to prior sphincterotomy.  CBD was cannulated with Rx44 Autotome and 035 hydra Jagwire.  Dilute contrast was injected.  CBD was of normal caliber without filling defects.  Common hepatic duct and intrahepatic biliary radicals were dilated tapering.  There was tapering of common hepatic duct to the level of a metallic clip.  No filling defects were identified.  Second guidewire was left in order to place  2 stents.  It was difficult to place one stent, therefore, a second was not attempted.  Initially, 10- [place], 9 cm long plastic stent was placed.  While proximal end of the stent was above the level of stricture, I was not satisfied.  Therefore, the stent was removed.  A short biliary sphincterotomy was performed. Using one-step Microvasive system 10-French 12 cm long plastic stent was placed, and as the stent was deployed, there was gush of bile and contrast into the duodenum.  The stent was felt to be in good position. Endoscope was withdrawn.  The patient tolerated the procedure well.  FINAL DIAGNOSES: 1. High-grade stricture at junction of common hepatic and common bile     duct located in close proximity to a metallic clip. 2. Large periampullary diverticulum. 3. 10 Fr/12 cm plastic stent placed for biliary decompression. 4. Suspect we are dealing with chronic benign stricture.  RECOMMENDATIONS: 1. We will continue IV antibiotics for another 48 hours. 2. We will bring the patient back for elective ERCP in 8 weeks or so.     He will also need cholangioscopy or SpyGlass examination next time.          ______________________________ Lionel December, M.D.     NR/MEDQ  D:  01/07/2012  T:  01/07/2012  Job:  [id number]  cc:   Jonette Eva, M.D. [address] [place] , [place] [address]  R. Roetta Sessions, MD FACP [facility] [address] [place] [address]  Angus G. Renard Matter, MD Fax: [phone]  Dr. Lovell Sheehan

## 2012-01-07 NOTE — Transfer of Care (Signed)
  Anesthesia Post-op Note  Patient: [name]  Procedure(s) Performed: Procedure(s) (LRB) with comments: ENDOSCOPIC RETROGRADE CHOLANGIOPANCREATOGRAPHY (ERCP) (N/A) - possible biliary stenting SPHINCTEROTOMY (N/A) - Extended BILIARY STENT PLACEMENT (N/A)  Patient Location: PACU  Anesthesia Type: General  Level of Consciousness: awake, alert , oriented and patient cooperative  Airway and Oxygen Therapy: Patient Spontanous Breathing and Patient connected to face mask oxygen  Post-op Pain: mild  Post-op Assessment: Post-op Vital signs reviewed, Patient's Cardiovascular Status Stable, Respiratory Function Stable, Patent Airway and No signs of Nausea or vomiting  Post-op Vital Signs: Reviewed and stable  Complications: No apparent anesthesia complications  

## 2012-01-07 NOTE — Anesthesia Procedure Notes (Signed)
Procedure Name: Intubation Date/Time: 01/07/2012 8:14 AM Performed by: Carolyne Littles, Billal Rollo L Pre-anesthesia Checklist: Patient identified, Patient being monitored, Timeout performed, Emergency Drugs available and Suction available Patient Re-evaluated:Patient Re-evaluated prior to inductionOxygen Delivery Method: Circle System Utilized Preoxygenation: Pre-oxygenation with 100% oxygen Intubation Type: IV induction, Rapid sequence and Cricoid Pressure applied Laryngoscope Size: 3 and Miller Grade View: Grade I Tube type: Oral Tube size: 8.0 mm Number of attempts: 1 Airway Equipment and Method: stylet Placement Confirmation: ETT inserted through vocal cords under direct vision,  positive ETCO2 and breath sounds checked- equal and bilateral Secured at: 21 cm Tube secured with: Tape Dental Injury: Teeth and Oropharynx as per pre-operative assessment

## 2012-01-07 NOTE — Brief Op Note (Signed)
01/06/2012 - 01/07/2012  9:52 AM  PATIENT:  [name]  70 y.o. male  PRE-OPERATIVE DIAGNOSIS:  Acute Cholangitis, jaundice  POST-OPERATIVE DIAGNOSIS:  Acute Cholangitis, jaundice, stricture at chd, cbd  PROCEDURE:  Procedure(s) (LRB) with comments: ENDOSCOPIC RETROGRADE CHOLANGIOPANCREATOGRAPHY (ERCP) (N/A) - possible biliary stenting SPHINCTEROTOMY (N/A) - Extended BILIARY STENT PLACEMENT (N/A)  SURGEON:  Surgeon(s) and Role:    * Malissa Hippo, MD - Primary   Findings; Large periampullary diverticulum. Separate pancreatic and biliary orifice towards the inferior end of right wall of diverticulum(secondary to previous ES). Normal caliber CBD. Dilated intrahepatic biliary radicles and common hepatic duct with gradually tapering stricture at the level of metallic clip. Sphincterotomy extended for 2-3 mm. 10 French 9 cm biliary stent placed subsequently removed. 10 French 12 cm biliary stent placed excellent drainage. Patient tolerated procedure well.

## 2012-01-07 NOTE — Anesthesia Preprocedure Evaluation (Addendum)
Anesthesia Evaluation  Patient identified by MRN, date of birth, ID band Patient awake    Reviewed: Allergy & Precautions, H&P , NPO status , Patient's Chart, lab work & pertinent test results, reviewed documented beta blocker date and time   Airway Mallampati: II TM Distance: >3 FB Neck ROM: Full    Dental  (+) Chipped,    Pulmonary  breath sounds clear to auscultation        Cardiovascular Rhythm:Regular     Neuro/Psych    GI/Hepatic GERD-  Poorly Controlled,  Endo/Other    Renal/GU      Musculoskeletal   Abdominal   Peds  Hematology   Anesthesia Other Findings   Reproductive/Obstetrics                           Anesthesia Physical Anesthesia Plan  ASA: II  Anesthesia Plan: General   Post-op Pain Management:    Induction: Intravenous, Rapid sequence and Cricoid pressure planned  Airway Management Planned: Oral ETT  Additional Equipment:   Intra-op Plan:   Post-operative Plan: Extubation in OR  Informed Consent: I have reviewed the patients History and Physical, chart, labs and discussed the procedure including the risks, benefits and alternatives for the proposed anesthesia with the patient or authorized representative who has indicated his/her understanding and acceptance.   Dental advisory given  Plan Discussed with: CRNA, Anesthesiologist and Surgeon  Anesthesia Plan Comments:         Anesthesia Quick Evaluation

## 2012-01-07 NOTE — Progress Notes (Signed)
Patient continues to complain of intermittent sharp epigastric pain radiating up into his chest. He spiked temp during the night  to 102 F. he complains of pain over the right rib cage along the mid axillary line. He does not appear to be in acute distress. His abdomen is very soft and nontender. Lab data; INR is 1.14. Bilirubin is 3.6; was 5.9 yesterday. Indirect bilirubin 2.1(he also has Gilbert's syndrome) AP 171; was 193 yesterday. AST 122; was 224 yesterday. ALT 245; was 343 yesterday  Albumin 3.2. Blood cultures negative so for. Assessment; Patient has acute cholangitis. He possibly has  recurrent choledocholithiasis. His last ERCP was in January 2007 by Dr. Jena Gauss  with removal of common duct stones. Since his CBD is nondilated, level of obstruction must be more proximal since intrahepatic system is dilated. Recommendations; Will proceed with ERCP with therapeutic intention. I have reviewed the procedure in detail with the patient. I have told him he may need to be stented depending on the findings.

## 2012-01-07 NOTE — Anesthesia Postprocedure Evaluation (Signed)
  Anesthesia Post-op Note  Patient: [name]  Procedure(s) Performed: Procedure(s) (LRB) with comments: ENDOSCOPIC RETROGRADE CHOLANGIOPANCREATOGRAPHY (ERCP) (N/A) - possible biliary stenting SPHINCTEROTOMY (N/A) - Extended BILIARY STENT PLACEMENT (N/A)  Patient Location: PACU  Anesthesia Type: General  Level of Consciousness: awake, alert , oriented and patient cooperative  Airway and Oxygen Therapy: Patient Spontanous Breathing and Patient connected to face mask oxygen  Post-op Pain: mild  Post-op Assessment: Post-op Vital signs reviewed, Patient's Cardiovascular Status Stable, Respiratory Function Stable, Patent Airway and No signs of Nausea or vomiting  Post-op Vital Signs: Reviewed and stable  Complications: No apparent anesthesia complications

## 2012-01-08 LAB — COMPREHENSIVE METABOLIC PANEL
AST: 61 U/L — ABNORMAL HIGH (ref 0–37)
BUN: 11 mg/dL (ref 6–23)
CO2: 28 mEq/L (ref 19–32)
Calcium: 8.7 mg/dL (ref 8.4–10.5)
Creatinine, Ser: 0.89 mg/dL (ref 0.50–1.35)
GFR calc Af Amer: >90 mL/min (ref >90)
GFR calc non Af Amer: 83 mL/min — ABNORMAL LOW (ref >90)
Glucose, Bld: 98 mg/dL (ref 70–99)
Total Bilirubin: 1.8 mg/dL — ABNORMAL HIGH (ref 0.3–1.2)

## 2012-01-08 LAB — CBC
Hemoglobin: 13.7 g/dL (ref 13.0–17.0)
Platelets: 116 10*3/uL — ABNORMAL LOW (ref 150–400)
RBC: 4.38 MIL/uL (ref 4.22–5.81)
WBC: 4.8 10*3/uL (ref 4.0–10.5)

## 2012-01-08 LAB — AMYLASE: Amylase: 34 U/L (ref 0–105)

## 2012-01-08 MED ORDER — POTASSIUM CHLORIDE 10 MEQ/100ML IV SOLN
10.0000 meq | INTRAVENOUS | Status: AC
  Administered 2012-01-08 (×3): 10 meq via INTRAVENOUS
  Filled 2012-01-08 (×3): qty 100

## 2012-01-08 MED ORDER — POTASSIUM CHLORIDE CRYS ER 20 MEQ PO TBCR
20.0000 meq | EXTENDED_RELEASE_TABLET | Freq: Two times a day (BID) | ORAL | Status: DC
  Administered 2012-01-08 – 2012-01-09 (×3): 20 meq via ORAL
  Filled 2012-01-08 (×3): qty 1

## 2012-01-08 MED ORDER — ENOXAPARIN SODIUM 40 MG/0.4ML ~~LOC~~ SOLN
40.0000 mg | SUBCUTANEOUS | Status: DC
  Administered 2012-01-08: 40 mg via SUBCUTANEOUS
  Filled 2012-01-08: qty 0.4

## 2012-01-08 NOTE — Addendum Note (Signed)
Addendum  created 01/08/12 1842 by Marolyn Hammock, CRNA   Modules edited:Notes Section

## 2012-01-08 NOTE — Progress Notes (Signed)
ANTIBIOTIC CONSULT NOTE  Pharmacy Consult for Unasyn Indication: bilirubinuria  No Known Allergies  Patient Measurements: Height: 6\' 1"  (185.4 cm) Weight: 243 lb 4.8 oz (110.36 kg) IBW/kg (Calculated) : 79.9   Vital Signs: Temp: 98.1 F (36.7 C) (11/17 0500) Temp src: Oral (11/17 0500) BP: 155/77 mmHg (11/17 0500) Pulse Rate: 65  (11/17 0500) Intake/Output from previous day: 11/16 0701 - 11/17 0700 In: 3141.7 [P.O.:320; I.V.:2621.7; IV Piggyback:200] Out: -  Intake/Output from this shift:    Labs:  [facility] 01/08/12 0610 01/06/12 1228  WBC 4.8 5.6  HGB 13.7 14.7  PLT 116* 134*  LABCREA -- --  CREATININE 0.89 0.92   Estimated Creatinine Clearance: 97.7 ml/min (by C-G formula based on Cr of 0.89). No results found for this basename: VANCOTROUGH:2,VANCOPEAK:2,VANCORANDOM:2,GENTTROUGH:2,GENTPEAK:2,GENTRANDOM:2,TOBRATROUGH:2,TOBRAPEAK:2,TOBRARND:2,AMIKACINPEAK:2,AMIKACINTROU:2,AMIKACIN:2, in the last 72 hours   Microbiology: Recent Results (from the past 720 hour(s))  CULTURE, BLOOD (ROUTINE X 2)     Status: Normal (Preliminary result)   Collection Time   01/06/12 10:34 PM      Component Value Range Status Comment   Specimen Description Blood RIGHT ANTECUBITAL   Final    Special Requests BOTTLES DRAWN AEROBIC AND ANAEROBIC 8CC   Final    Culture NO GROWTH 2 DAYS   Final    Report Status PENDING   Incomplete   CULTURE, BLOOD (ROUTINE X 2)     Status: Normal (Preliminary result)   Collection Time   01/06/12 10:40 PM      Component Value Range Status Comment   Specimen Description Blood BLOOD LEFT HAND   Final    Special Requests BOTTLES DRAWN AEROBIC AND ANAEROBIC 7CC   Final    Culture NO GROWTH 2 DAYS   Final    Report Status PENDING   Incomplete   SURGICAL PCR SCREEN     Status: Normal   Collection Time   01/07/12  4:20 AM      Component Value Range Status Comment   MRSA, PCR NEGATIVE  NEGATIVE Final    Staphylococcus aureus NEGATIVE  NEGATIVE Final      Medical History: Past Medical History  Diagnosis Date  . Arthritis   . Numbness     left leg after back surgery  . Chronic back pain   . GERD (gastroesophageal reflux disease)   . Hx of adenomatous colonic polyps   . Schatzki's ring     History of  . Helicobacter pylori gastritis     Treated  . IBS (irritable bowel syndrome)   . Prostate ca     seed implants    Medications:  Scheduled:     . ampicillin-sulbactam (UNASYN) IV  3 g Intravenous Q6H  . enoxaparin  40 mg Subcutaneous Q24H  . pantoprazole  40 mg Oral QAC breakfast  . potassium chloride  10 mEq Intravenous Q1 Hr x 3   Assessment: 71 yo M with bilirubinuria on Day#3 Unasyn. Renal function at baseline. Cx data pending. Noted plans for another 24-48 hrs of IV abx.   Goal of Therapy:  Eradicate infection.  Plan:  1) Continue Unasyn 3gm IV q6h 2) Pharmacy to sign off. Please re-consult as needed.    [name] 01/08/2012,8:40 AM

## 2012-01-08 NOTE — Procedures (Signed)
Subjective: patient continues to feel some better. He still has some discomfort in right upper quadrant. But no nausea. He still appears to be somewhat jaundiced. ERCP done yesterday biliary sphincterotomy and biliary stenting. He does have a low serum potassium level.  Objective: Vital signs in last 24 hours: Temp:  [97.8 F (36.6 C)-98.5 F (36.9 C)] 98.1 F (36.7 C) (11/17 0500) Pulse Rate:  [62-95] 65  (11/17 0500) Resp:  [16-26] 20  (11/17 0500) BP: (139-164)/(67-88) 155/77 mmHg (11/17 0500) SpO2:  [90 %-100 %] 96 % (11/17 0500) Weight change:  Last BM Date: 01/07/12  Intake/Output from previous day: 11/16 0701 - 11/17 0700 In: 3141.7 [P.O.:320; I.V.:2621.7; IV Piggyback:200] Out: -  Intake/Output this shift:    Physical Exam: Appearance the patient appears somewhat jaundiced but is alert and oriented  Lungs are clear to P&A  Heart regular rhythm no murmurs  Abdomen patient has ventral hernia which is easily reducible. He still has some tenderness in right upper quadrant and epigastrium.  Extremities free of edema   Basename 01/08/12 0610 01/06/12 1228  WBC 4.8 5.6  HGB 13.7 14.7  HCT 39.8 44.1  PLT 116* 134*   BMET  Basename 01/08/12 0610 01/06/12 1228  NA 135 136  K 3.1* 4.0  CL 99 98  CO2 28 29  GLUCOSE 98 121*  BUN 11 13  CREATININE 0.89 0.92  CALCIUM 8.7 9.7    Studies/Results: Ct Abdomen Pelvis W Contrast  01/06/2012  *RADIOLOGY REPORT*  Clinical Data: Right abdominal pain.  History of cholecystectomy.  CT ABDOMEN AND PELVIS WITH CONTRAST  Technique:  Multidetector CT imaging of the abdomen and pelvis was performed following the standard protocol during bolus administration of intravenous contrast.  Contrast: OMNIPAQUE IOHEXOL 300 MG/ML  SOLN  Comparison: Abdominal CT 10/25/2011.  Findings: There is chronic elevation of the right hemidiaphragm associated with improved linear atelectasis at the right lung base. The left lung base is clear.   There is no pleural effusion. Coronary artery calcifications are noted.  There is mild intrahepatic biliary dilatation status post cholecystectomy.  The previous reported fluid collection within the cholecystectomy bed appears smaller.  On the reformatted images, this is tubular in shape and could reflect a dilated cystic duct remnant.  Its walls remain slightly thickened.  The surrounding inflammatory changes have improved, although have not completely resolved.  There is no extrahepatic biliary dilatation.  The liver contours are stable.  There are no focal liver lesions.  There is no evidence of pancreatic mass or pancreatic ductal dilatation.  The spleen, adrenal glands and kidneys appear stable.  There is a duodenal diverticulum without evidence of inflammation.  Colonic interposition between the liver and right hemidiaphragm appears stable.  There is no evidence of bowel obstruction. Scattered vascular calcifications are stable.  The urinary bladder, prostate gland and seminal vesicles are unchanged status post brachytherapy.  There is extensive thoracolumbar spondylosis status post lumbar fusion.  No acute osseous findings are identified.  IMPRESSION:  1.  Improving inflammatory process in the right upper quadrant of undetermined etiology status post remote cholecystectomy.  There is mild intrahepatic biliary dilatation, and a tubular fluid collection in the cholecystectomy bed could reflect distension of the cystic duct remnant. MRCP should be considered to exclude choledocholithiasis in this patient with an elevated bilirubin level. 2.  The extrahepatic biliary system does not appear significantly dilated.  The pancreatic head and adjacent duodenal diverticulum appear stable. 3.  Otherwise stable examination.   Original  Report Authenticated By: Carey Bullocks, M.D.    Dg Ercp With Sphincterotomy  01/07/2012  *RADIOLOGY REPORT*  Clinical Data: And cholangitis.  Jaundice.  ERCP abdomen and pelvis CT,  01/06/2012  Comparison:  None.  Technique:  Multiple spot images obtained with the fluoroscopic device and submitted for interpretation post-procedure.  .  Findings: Injection of the common bile duct shows the duct be normal in caliber and smooth.  There is no intrahepatic bile duct dilation.  No filling defects are visualized to suggest a duct stone.  Report leak, a sphincterotomy was performed.  Follow up imaging shows a common bile duct stent.  IMPRESSION: ERCP with sphincterotomy and stent placement.  These images were submitted for radiologic interpretation only. Please see the procedural report for the amount of contrast and the fluoroscopy time utilized.   Original Report Authenticated By: Amie Portland, M.D.     Medications:     . ampicillin-sulbactam (UNASYN) IV  3 g Intravenous Q6H  . enoxaparin  40 mg Subcutaneous Q24H  . pantoprazole  40 mg Oral QAC breakfast  . potassium chloride  10 mEq Intravenous Q1 Hr x 3       . sodium chloride 100 mL/hr at 01/07/12 2206  . [DISCONTINUED] sodium chloride       Assessment/Plan: The patient has high-grade stricture at the junction of the common hepatic and common bile duct and this has been stented  Periampullary diverticulum  Cholangitis this is being treated with antibiotics IV Unasyn air  He does have a low serum potassium level we will give IV potassium runs today and recheck his potassium.   LOS: 2 days   Cyree Chuong G 01/08/2012, 7:35 AM

## 2012-01-08 NOTE — Anesthesia Postprocedure Evaluation (Signed)
  Anesthesia Post-op Note  Patient: [name]  Procedure(s) Performed: Procedure(s) (LRB) with comments: ENDOSCOPIC RETROGRADE CHOLANGIOPANCREATOGRAPHY (ERCP) (N/A) - possible biliary stenting SPHINCTEROTOMY (N/A) - Extended BILIARY STENT PLACEMENT (N/A)  Patient Location: Room 311  Anesthesia Type: General  Level of Consciousness: awake, alert , oriented and patient cooperative  Airway and Oxygen Therapy: Patient Spontanous Breathing Room air  Post-op Pain: mild  Post-op Assessment: Post-op Vital signs reviewed, Patient's Cardiovascular Status Stable, Respiratory Function Stable, Patent Airway and No signs of Nausea or vomiting  Post-op Vital Signs: Reviewed and stable  Complications: No apparent anesthesia complications

## 2012-01-08 NOTE — Progress Notes (Signed)
Subjective; Patient feels much better. He denies right upper quadrant pain. He has good appetite. Has noted his urine to be less dark. He has not experienced fever or chills since ERCP yesterday. Patient wants me to do the followup ERCP since I did his first exam. Objective; BP 155/77  Pulse 65  Temp 98.1 F (36.7 C) (Oral)  Resp 20  Ht 6\' 1"  (1.854 m)  Wt 243 lb 4.8 oz (110.36 kg)  BMI 32.10 kg/m2  SpO2 96% Patient is alert and in no acute distress. Cardiac exam with regular rhythm normal S1 and S2. No murmur noted. Lungs are clear to auscultation. Abdomen is soft and nontender. Lab data; WBC 4.8, H&H 13.7 and 39.8 and platelet count 116K. Serum amylase 34. Electrolytes WNL except serum potassium of 3.1. Bilirubin 1.8, AP 132, AST 61, ALT 156 and albumin 2.7. Blood cultures remain negative. Assessment; Acute cholangitis secondary to biliary stricture at junction of CHD and CBD. Status post biliary stenting yesterday along with small sphincterotomy. Significant drop in bilirubin and transaminases since biliary decompression. Mild hypokalemia. Mild thrombocytopenia. His platelet count was decreased in September 2012 and subsequently. Will need further evaluation if it persists. Recommendations; Continue IV Unasyn another 24 hours. Can be discharged in a.m. if he remains afebrile. Levaquin 750 mg by mouth daily for another 5 days after discharge. No heparin or aspirin for 2 more days. KCl 20 mEq by mouth twice a day. Will check comprehensive chemistry panel and platelet count in a.m.

## 2012-01-09 ENCOUNTER — Telehealth: Payer: Self-pay | Admitting: Urgent Care

## 2012-01-09 LAB — COMPREHENSIVE METABOLIC PANEL
ALT: 124 U/L — ABNORMAL HIGH (ref 0–53)
AST: 42 U/L — ABNORMAL HIGH (ref 0–37)
Albumin: 2.8 g/dL — ABNORMAL LOW (ref 3.5–5.2)
CO2: 26 mEq/L (ref 19–32)
Chloride: 101 mEq/L (ref 96–112)
GFR calc non Af Amer: 84 mL/min — ABNORMAL LOW (ref >90)
Potassium: 3.5 mEq/L (ref 3.5–5.1)
Sodium: 138 mEq/L (ref 135–145)
Total Bilirubin: 1.3 mg/dL — ABNORMAL HIGH (ref 0.3–1.2)

## 2012-01-09 MED ORDER — POTASSIUM CHLORIDE CRYS ER 20 MEQ PO TBCR: 20.0000 meq | EXTENDED_RELEASE_TABLET | Freq: Every day | ORAL | Status: DC

## 2012-01-09 MED ORDER — INFLUENZA VIRUS VACC SPLIT PF IM SUSP
0.5000 mL | Freq: Once | INTRAMUSCULAR | Status: AC
  Administered 2012-01-09: 0.5 mL via INTRAMUSCULAR
  Filled 2012-01-09: qty 0.5

## 2012-01-09 MED ORDER — INFLUENZA VIRUS VACC SPLIT PF IM SUSP: 0.5000 mL | INTRAMUSCULAR | Status: DC | PRN

## 2012-01-09 NOTE — Progress Notes (Signed)
Pt discharged home today per Dr. Renard Matter. Pt's IV site D/C'd and WNL. Pt's VS stable at this time. Provided with home medication list, discharge instructions and prescriptions. Pt verbalized understanding. Pt left floor on foot in stable condition accompanied by NT.

## 2012-01-09 NOTE — Progress Notes (Signed)
UR Chart Review Completed  

## 2012-01-09 NOTE — Care Management Note (Signed)
    Page 1 of 1   01/09/2012     11:15:25 AM   CARE MANAGEMENT NOTE 01/09/2012  Patient:  [name], [name]   Account Number:  [id number]  Date Initiated:  01/09/2012  Documentation initiated by:  Sharrie Rothman  Subjective/Objective Assessment:   Pt admitted from home with abd pain and jaundice. Pt lives with his wife and will return home at discharge. Pt is independent with ADL's.     Action/Plan:   NO CM or HH needs noted. Pt is discharged home today.   Anticipated DC Date:  01/09/2012   Anticipated DC Plan:  HOME/SELF CARE      DC Planning Services  CM consult      Choice offered to / List presented to:             Status of service:  Completed, signed off Medicare Important Message given?  YES (If response is "NO", the following Medicare IM given date fields will be blank) Date Medicare IM given:  01/09/2012 Date Additional Medicare IM given:    Discharge Disposition:  HOME/SELF CARE  Per UR Regulation:    If discussed at Long Length of Stay Meetings, dates discussed:    Comments:  01/09/12 1115 Arlyss Queen, RN BSN CM

## 2012-01-09 NOTE — Telephone Encounter (Signed)
Per Dr Karilyn Cota, pt wants FU & FU ERCP with him.  Discussed w/ Dr Darrick Penna Please make note in chart Thanks

## 2012-01-09 NOTE — Discharge Summary (Signed)
NAMEMarland Kitchen  REYNOLDS, FRANCHINA NO.:  [id number]  MEDICAL RECORD NO.:  [id number]  LOCATION:                                 FACILITY:  PHYSICIAN:  Christabella Alvira G. Renard Matter, MD   DATE OF BIRTH:  22-Oct-1940  DATE OF ADMISSION:  01/07/2012 DATE OF DISCHARGE:  11/18/2013LH                              DISCHARGE SUMMARY   DIAGNOSES:  Cholangitis, biliary stricture with subsequent jaundice, hypokalemia, ventral hernia, small hiatal hernia, diabetes, coronary artery disease, history of prostate cancer, previous history of colon cancer.  CONDITION:  Stable and improved at time of this discharge.  This patient had seen Dr. Annabell Howells of Urology on Tuesday prior to his admission here for further treatment of his prostate cancer, which had been previously treated with seed implantation.  He had an ultrasound done a day later.  He went back to see Dr. Annabell Howells because of dark appearing urine, which had been present for several days.  He subsequently was referred to Dr. Darrick Penna of Gastroenterology who recommended this hospitalization.  He had a cholecystectomy in 2007 for a gangrenous gallbladder and percutaneous draining pump following this. ERCP done with stone extraction in 2007.  The patient had another hospitalization here in September where he was evaluated for abdominal pain, obstipation, and ventral hernia, small hiatal hernia.  Gastric erosions were noted at that time.  He did have some nausea preceding this episode and dark urine thought to be bilirubin.  He had chills and nausea but no vomiting but had very poor appetite over period of several days.  He was thought to have cholangitis and had been placed on intravenous antibiotics.  PHYSICAL EXAMINATION:  GENERAL:  On admission, alert appearing male. VITAL SIGNS:  Blood pressure 175/94, respirations 20, pulse 77, temp 99. HEENT:  Eyes:  PERRLA.  Icteric appearing sclerae.  Oropharynx, mucosa normal. NECK:  Supple.  No JVD or thyroid  abnormalities. HEART:  Regular rhythm.  No murmurs. LUNGS:  Clear to P and A. ABDOMEN:  The patient does have ventral hernia and does have tenderness in the epigastric and right upper quadrant tenderness.  No organomegaly. EXTREMITIES:  Free of edema. NEUROLOGICAL:  The patient is alert and oriented.  No motor or sensory disturbances. SKIN:  The patient is obviously jaundiced.  LABORATORY DATA:  CBC on admission, WBC 5600 with hemoglobin of 14.7, hematocrit 44.1.  The patient's chemistries, sodium 136, potassium 4, chloride 98, CO2 of 29, glucose 121, BUN 13, creatinine 0.92.  AST 224, ALT 343, alkaline phosphatase 193, bilirubin 5.9, GFR 82.  Repeat liver enzymes,  protein 7.3, albumin 3.2, AST 122, ALT 245, alkaline phosphatase 171, bilirubin 3.6, direct bilirubin 1.5, indirect 2.1. Blood culture, no growth in 2 days.  Subsequent blood studies November 17, sodium 135, potassium 3.1, chloride 99, CO2 of 28, glucose 98, creatinine 0.89, calcium 8.7, AST 2.7, ALT 61, alkaline phosphatase 156, bilirubin 132, potassium 3.1.  Subsequent lab tests showed potassium to be 3.5, AST 42, ALT 124, alkaline phosphatase 129, bilirubin 1.3.  RADIOLOGY:  CT of abdomen and pelvis done shortly after admission showed inflammatory process in right upper quadrant of undetermined etiology. Status post  remote cholecystectomy.  The patient has mild intrahepatic biliary dilatation and tubular fluid collection in cystectomy bed which could reflect distention of the cystic duct remnant.  MRCP was considered to exclude choledocholithiasis.  Extrahepatic biliary system does not appear significantly dilated.  Subsequent x-rays, findings of injection of common bile duct shows the duct to be normal in caliber and smooth.  There was no intrahepatic bile dilatation or filling defects visualized to suggest stone.  Reportedly a sphincterotomy was performed. Followup images show common bile duct stent.  HOSPITAL  COURSE:  The patient was admitted to med/surg floor and was placed on intravenous sodium chloride.  He was placed on Protonix 40 mg daily and was started on Unasyn intravenously 3 g every 6 hours was given, Tylenol for pain and fever and started on regular diet.  He was seen in consultation by Gastroenterology Service Dr. Karilyn Cota and was noticed that he was running a fever and was quite jaundiced.  On November 16, he was taken to OR for ERCP, extension of biliary sphincterotomy and biliary stenting.  This was accomplished.  He was noted he had high-grade stricture at the junction of common hepatic and common bile duct located close to metallic clip.  A plastic stent was placed for biliary decompression.  The patient tolerated procedure in satisfactory manner.  It was recommended he continue his IV antibiotics for another 48 hours and it was recommended that he be brought back for elective ERCP in about 8 weeks.  It was felt he would also need cholangioscopy or spy glass examination next time.  The patient tolerated the procedure in satisfactory manner and became less uncomfortable.  His jaundice seem to be subsiding and felt he could be discharged today once he is seen by his gastroenterologist.     Ishmael Holter. Renard Matter, MD     AGM/MEDQ  D:  01/09/2012  T:  01/09/2012  Job:  [id number]

## 2012-01-09 NOTE — Progress Notes (Signed)
Nutrition Brief Note  Patient identified on the Malnutrition Screening Tool (MST) Report  Body mass index is 32.10 kg/(m^2). Pt meets criteria for Obesity Class 1 based on current BMI.   Current diet order is Regular, patient is consuming approximately 75-100% of meals at this time. Labs and medications reviewed.   No nutrition interventions warranted at this time. If nutrition issues arise, please consult RD.   #[phone]

## 2012-01-10 ENCOUNTER — Encounter (HOSPITAL_COMMUNITY): Payer: Self-pay | Admitting: Internal Medicine

## 2012-01-11 LAB — CULTURE, BLOOD (ROUTINE X 2)
Culture: NO GROWTH
Culture: NO GROWTH

## 2012-01-16 ENCOUNTER — Ambulatory Visit (INDEPENDENT_AMBULATORY_CARE_PROVIDER_SITE_OTHER): Payer: Medicare Other | Admitting: Internal Medicine

## 2012-01-16 ENCOUNTER — Encounter (INDEPENDENT_AMBULATORY_CARE_PROVIDER_SITE_OTHER): Payer: Self-pay | Admitting: Internal Medicine

## 2012-01-16 VITALS — BP 130/70 | HR 82 | Temp 97.9°F | Resp 20 | Ht 72.0 in | Wt 237.2 lb

## 2012-01-16 DIAGNOSIS — K831 Obstruction of bile duct: Secondary | ICD-10-CM | POA: Insufficient documentation

## 2012-01-16 DIAGNOSIS — K259 Gastric ulcer, unspecified as acute or chronic, without hemorrhage or perforation: Secondary | ICD-10-CM

## 2012-01-16 DIAGNOSIS — E876 Hypokalemia: Secondary | ICD-10-CM

## 2012-01-16 MED ORDER — PANTOPRAZOLE SODIUM 40 MG PO TBEC: 40.0000 mg | DELAYED_RELEASE_TABLET | Freq: Two times a day (BID) | ORAL | Status: DC

## 2012-01-16 NOTE — Progress Notes (Signed)
Presenting complaint;  Patient complains of epigastric and right upper quadrant pain. He underwent a biliary stenting on 01/07/2012 for CBD stricture and cholangitis.  Subjective:  Patient is 71 year old Caucasian male who was admitted to [facility] week before last with jaundice fever and right upper quadrant pain. ERCP revealed CBD stricture treated with restenting. His transaminases and bilirubin dropped significantly by the time he was discharged 1 week ago. He states color of his urine is back to normal. He has not experienced fever or chills. He continues to complain of midepigastric pain radiating towards right upper quadrant. Pain at the sites may occur at different times are simultaneously and does not appear to be treated with meals or physical activity. Pain is never unbearable. It is described to be squeezing pain associated with nausea. Occasionally he aches up in the middle of night with this pain. He states he has had this pain since his gallbladder surgery of January 2007. He had gangrenous gallbladder. He developed liver abscess treated percutaneously. Imaging studies from September 2013 and earlier this month were negative for fluid collection. His appetite is fair. He may have lost couple of pounds. His bowels move regularly and he denies melena rectal bleeding or dysuria. He had EGD by Dr. Jena Gauss on 10/26/2011 revealing small sliding hiatal hernia abnormal patch of gastric mucosa which was biopsied and turned to be superficial ulcer. Biopsy from duodenum reveals peptic duodenitis the H. pylori stains are negative. He was begun on Carafate.  Current Medications: Current Outpatient Prescriptions  Medication Sig Dispense Refill  . potassium chloride SA (K-DUR,KLOR-CON) 20 MEQ tablet Take 1 tablet (20 mEq total) by mouth daily.  30 tablet  1  . pantoprazole (PROTONIX) 40 MG tablet Take 1 tablet (40 mg total) by mouth 2 (two) times daily.  60 tablet  5  . simethicone (MYLICON)  80 MG chewable tablet Chew 2 tablets (160 mg total) by mouth every 4 (four) hours as needed for flatulence (for gas pain).  30 tablet  5  . sucralfate (CARAFATE) 1 GM/10ML suspension Take 10 mLs (1 g total) by mouth 4 (four) times daily -  with meals and at bedtime.  420 mL  2     Objective: Blood pressure 130/70, pulse 82, temperature 97.9 F (36.6 C), temperature source Oral, resp. rate 20, height 6' (1.829 m), weight 237 lb 3.2 oz (107.593 kg). Patient is alert and in no acute distress. Conjunctiva is pink. Sclera is nonicteric Oropharyngeal mucosa is normal. No neck masses or thyromegaly noted. Cardiac exam with regular rhythm normal S1 and S2. No murmur or gallop noted. Lungs are clear to auscultation. Abdomen is symmetrical. Bowel sounds are normal. Abdomen is soft with mild midepigastric tenderness. No organomegaly or masses.  No LE edema or clubbing noted.   Assessment:  #1. Recent syndrome with right upper quadrant pain fever chills and jaundice unequivocally secondary to cholangitis because of CBD stricture treated with biliary stent with resolution of sharp RUQ pain fever and improvement in LFTs. 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 He will be further evaluated with ERCP with Spyglass it examination and possible double stenting in first week of January,2014. #2. Chronic/intermittent epigastric and right upper quadrant pain of or than 6 years duration. Recent EGD showed duodenitis and a small gastric ulcer pain does not have all the features of GI pain. Nevertheless we'll treat him with PPI and see how he does. Even with Carafate has been ineffective. #3. History of hypokalemia. Patient  does not take diuretics.   Plan:  Pantoprazole 40 mg by mouth twice a day. Discontinue sucralfate. He would go to lab for LFTs and serum potassium. ERCP with Spyglass examination and stent change in first week of January 2014.

## 2012-01-16 NOTE — Patient Instructions (Signed)
Office will call you with results of your blood tests. New medication is pantoprazole 40 mg by mouth 30 minutes before breakfast and evening meal. ERCP to be scheduled in first week of January 2014

## 2012-01-17 LAB — HEPATIC FUNCTION PANEL
ALT: 47 U/L (ref 0–53)
AST: 33 U/L (ref 0–37)
Albumin: 3.7 g/dL (ref 3.5–5.2)
Alkaline Phosphatase: 118 U/L — ABNORMAL HIGH (ref 39–117)

## 2012-01-24 ENCOUNTER — Other Ambulatory Visit (INDEPENDENT_AMBULATORY_CARE_PROVIDER_SITE_OTHER): Payer: Self-pay | Admitting: *Deleted

## 2012-01-24 ENCOUNTER — Encounter (INDEPENDENT_AMBULATORY_CARE_PROVIDER_SITE_OTHER): Payer: Self-pay | Admitting: *Deleted

## 2012-01-24 DIAGNOSIS — K831 Obstruction of bile duct: Secondary | ICD-10-CM

## 2012-01-26 ENCOUNTER — Encounter (HOSPITAL_COMMUNITY): Payer: Self-pay | Admitting: Pharmacy Technician

## 2012-02-07 ENCOUNTER — Ambulatory Visit (INDEPENDENT_AMBULATORY_CARE_PROVIDER_SITE_OTHER): Payer: Medicare Other | Admitting: Internal Medicine

## 2012-02-10 NOTE — Patient Instructions (Addendum)
20 [name]  02/10/2012   Your procedure is scheduled on:   02/21/2012  Report to [facility] at  615  AM.  Call this number if you have problems the morning of surgery: [phone]   Remember:   Do not eat food:After Midnight.  May have clear liquids:until Midnight .    Take these medicines the morning of surgery with A SIP OF WATER: protonix   Do not wear jewelry, make-up or nail polish.  Do not wear lotions, powders, or perfumes.   Do not shave 48 hours prior to surgery. Men may shave face and neck.  Do not bring valuables to the hospital.  Contacts, dentures or bridgework may not be worn into surgery.  Leave suitcase in the car. After surgery it may be brought to your room.  For patients admitted to the hospital, checkout time is 11:00 AM the day of discharge.   Patients discharged the day of surgery will not be allowed to drive home.  Name and phone number of your driver: family  Special Instructions: N/A   Please read over the following fact sheets that you were given: Pain Booklet, Surgical Site Infection Prevention, Anesthesia Post-op Instructions and Care and Recovery After Surgery Endoscopic Retrograde Cholangiopancreatography This is a test used to evaluate people with jaundice (a condition in which the person's skin is turning yellow because of the high level of bilirubin in your blood). Bilirubin is a product of the blood which is elevated when an obstruction in the bile duct occurs. The bile ducts are the pipe-like system which carry the bile from the liver to the gallbladder and out into your small bowel. In this test, a fiber optic endoscope (a small pencil-sized telescope) is inserted and a catheter is put into ducts for examination. This test can reveal stones, strictures, cysts, tumors, and other irregularities within the pancreatic ducts and bile ducts. PREPARATION FOR TEST Do not eat or drink after midnight the day before the test.  NORMAL FINDINGS Normal  size of biliary and pancreatic ducts. No obstruction or filling defects within the biliary or pancreatic ducts. Ranges for normal findings may vary among different laboratories and hospitals. You should always check with your doctor after having lab work or other tests done to discuss the meaning of your test results and whether your values are considered within normal limits. MEANING OF TEST  Your caregiver will go over the test results with you and discuss the importance and meaning of your results, as well as treatment options and the need for additional tests if necessary. OBTAINING THE TEST RESULTS  It is your responsibility to obtain your test results. Ask the lab or department performing the test when and how you will get your results. Document Released: 06/03/2004 Document Revised: 10/20/2010 Document Reviewed: 01/18/2008 [facility] Patient Information 2012 [place], [place].PATIENT INSTRUCTIONS POST-ANESTHESIA  IMMEDIATELY FOLLOWING SURGERY:  Do not drive or operate machinery for the first twenty four hours after surgery.  Do not make any important decisions for twenty four hours after surgery or while taking narcotic pain medications or sedatives.  If you develop intractable nausea and vomiting or a severe headache please notify your doctor immediately.  FOLLOW-UP:  Please make an appointment with your surgeon as instructed. You do not need to follow up with anesthesia unless specifically instructed to do so.  WOUND CARE INSTRUCTIONS (if applicable):  Keep a dry clean dressing on the anesthesia/puncture wound site if there is drainage.  Once the  wound has quit draining you may leave it open to air.  Generally you should leave the bandage intact for twenty four hours unless there is drainage.  If the epidural site drains for more than 36-48 hours please call the anesthesia department.  QUESTIONS?:  Please feel free to call your physician or the hospital operator if you have any questions, and  they will be happy to assist you.

## 2012-02-13 ENCOUNTER — Encounter (HOSPITAL_COMMUNITY)
Admission: RE | Admit: 2012-02-13 | Discharge: 2012-02-13 | Disposition: A | Discharge status: Home / Self Care | Payer: Medicare Other | Source: Ambulatory Visit | Attending: Internal Medicine | Admitting: Internal Medicine

## 2012-02-13 ENCOUNTER — Encounter (HOSPITAL_COMMUNITY): Payer: Self-pay

## 2012-02-13 ENCOUNTER — Other Ambulatory Visit: Payer: Self-pay

## 2012-02-13 LAB — BASIC METABOLIC PANEL
CO2: 32 mEq/L (ref 19–32)
Calcium: 9.3 mg/dL (ref 8.4–10.5)
Creatinine, Ser: 1.04 mg/dL (ref 0.50–1.35)
GFR calc non Af Amer: 70 mL/min — ABNORMAL LOW (ref >90)
Glucose, Bld: 99 mg/dL (ref 70–99)
Sodium: 138 mEq/L (ref 135–145)

## 2012-02-13 LAB — HEMOGLOBIN AND HEMATOCRIT, BLOOD
HCT: 43.8 % (ref 39.0–52.0)
Hemoglobin: 14.9 g/dL (ref 13.0–17.0)

## 2012-02-21 ENCOUNTER — Encounter (HOSPITAL_COMMUNITY): Payer: Self-pay | Admitting: Anesthesiology

## 2012-02-21 ENCOUNTER — Encounter (HOSPITAL_COMMUNITY): Payer: Self-pay | Admitting: *Deleted

## 2012-02-21 ENCOUNTER — Encounter (HOSPITAL_COMMUNITY)
Admission: RE | Disposition: A | Discharge status: Home / Self Care | Payer: Self-pay | Source: Ambulatory Visit | Attending: Internal Medicine

## 2012-02-21 ENCOUNTER — Other Ambulatory Visit: Payer: Self-pay | Admitting: Orthopedic Surgery

## 2012-02-21 ENCOUNTER — Ambulatory Visit (HOSPITAL_COMMUNITY): Payer: Medicare Other | Admitting: Anesthesiology

## 2012-02-21 ENCOUNTER — Ambulatory Visit (HOSPITAL_COMMUNITY)
Admission: RE | Admit: 2012-02-21 | Discharge: 2012-02-21 | Disposition: A | Discharge status: Home / Self Care | Payer: Medicare Other | Source: Ambulatory Visit | Attending: Internal Medicine | Admitting: Internal Medicine

## 2012-02-21 ENCOUNTER — Ambulatory Visit (HOSPITAL_COMMUNITY): Payer: Medicare Other

## 2012-02-21 DIAGNOSIS — R1011 Right upper quadrant pain: Secondary | ICD-10-CM

## 2012-02-21 DIAGNOSIS — K5710 Diverticulosis of small intestine without perforation or abscess without bleeding: Secondary | ICD-10-CM | POA: Insufficient documentation

## 2012-02-21 DIAGNOSIS — K2100 Gastro-esophageal reflux disease with esophagitis, without bleeding: Secondary | ICD-10-CM | POA: Insufficient documentation

## 2012-02-21 DIAGNOSIS — K319 Disease of stomach and duodenum, unspecified: Secondary | ICD-10-CM

## 2012-02-21 DIAGNOSIS — R1013 Epigastric pain: Secondary | ICD-10-CM | POA: Insufficient documentation

## 2012-02-21 DIAGNOSIS — K831 Obstruction of bile duct: Secondary | ICD-10-CM

## 2012-02-21 DIAGNOSIS — E119 Type 2 diabetes mellitus without complications: Secondary | ICD-10-CM | POA: Insufficient documentation

## 2012-02-21 DIAGNOSIS — Z01812 Encounter for preprocedural laboratory examination: Secondary | ICD-10-CM | POA: Insufficient documentation

## 2012-02-21 DIAGNOSIS — Z01810 Encounter for preprocedural cardiovascular examination: Secondary | ICD-10-CM | POA: Insufficient documentation

## 2012-02-21 DIAGNOSIS — K8050 Calculus of bile duct without cholangitis or cholecystitis without obstruction: Secondary | ICD-10-CM | POA: Insufficient documentation

## 2012-02-21 HISTORY — PX: ERCP: SHX5425

## 2012-02-21 HISTORY — PX: SPYGLASS CHOLANGIOSCOPY: SHX5441

## 2012-02-21 HISTORY — PX: ESOPHAGOGASTRODUODENOSCOPY: SHX5428

## 2012-02-21 HISTORY — PX: BILIARY STENT PLACEMENT: SHX5538

## 2012-02-21 SURGERY — ERCP, WITH INTERVENTION IF INDICATED
Anesthesia: General

## 2012-02-21 MED ORDER — LIDOCAINE HCL (CARDIAC) 10 MG/ML IV SOLN
INTRAVENOUS | Status: DC | PRN
  Administered 2012-02-21: 20 mg via INTRAVENOUS

## 2012-02-21 MED ORDER — PROPOFOL 10 MG/ML IV BOLUS
INTRAVENOUS | Status: DC | PRN
  Administered 2012-02-21: 140 mg via INTRAVENOUS
  Administered 2012-02-21: 20 mg via INTRAVENOUS

## 2012-02-21 MED ORDER — ACETYLCYSTEINE 20 % IN SOLN
4.0000 mL | Freq: Once | RESPIRATORY_TRACT | Status: AC
  Administered 2012-02-21: 4 mL via ORAL
  Filled 2012-02-21: qty 4

## 2012-02-21 MED ORDER — FENTANYL CITRATE 0.05 MG/ML IJ SOLN
INTRAMUSCULAR | Status: DC | PRN
  Administered 2012-02-21: 50 ug via INTRAVENOUS

## 2012-02-21 MED ORDER — ROCURONIUM BROMIDE 100 MG/10ML IV SOLN
INTRAVENOUS | Status: DC | PRN
  Administered 2012-02-21: 5 mg via INTRAVENOUS

## 2012-02-21 MED ORDER — GLYCOPYRROLATE 0.2 MG/ML IJ SOLN
INTRAMUSCULAR | Status: AC
  Filled 2012-02-21: qty 1

## 2012-02-21 MED ORDER — ACETYLCYSTEINE 200 MG/ML IV SOLN
INTRAVENOUS | Status: AC
  Filled 2012-02-21: qty 30

## 2012-02-21 MED ORDER — ONDANSETRON HCL 4 MG/2ML IJ SOLN: 4.0000 mg | Freq: Once | INTRAMUSCULAR | Status: DC | PRN

## 2012-02-21 MED ORDER — DEXAMETHASONE SODIUM PHOSPHATE 4 MG/ML IJ SOLN
INTRAMUSCULAR | Status: AC
  Filled 2012-02-21: qty 1

## 2012-02-21 MED ORDER — MIDAZOLAM HCL 2 MG/2ML IJ SOLN
1.0000 mg | INTRAMUSCULAR | Status: DC | PRN
  Administered 2012-02-21: 2 mg via INTRAVENOUS

## 2012-02-21 MED ORDER — FENTANYL CITRATE 0.05 MG/ML IJ SOLN
INTRAMUSCULAR | Status: AC
  Filled 2012-02-21: qty 2

## 2012-02-21 MED ORDER — ONDANSETRON HCL 4 MG/2ML IJ SOLN
4.0000 mg | Freq: Once | INTRAMUSCULAR | Status: AC
  Administered 2012-02-21: 4 mg via INTRAVENOUS

## 2012-02-21 MED ORDER — ONDANSETRON HCL 4 MG/2ML IJ SOLN
INTRAMUSCULAR | Status: AC
  Filled 2012-02-21: qty 2

## 2012-02-21 MED ORDER — SODIUM CHLORIDE 0.9 % IV SOLN
INTRAVENOUS | Status: DC | PRN
  Administered 2012-02-21: 08:00:00

## 2012-02-21 MED ORDER — ROCURONIUM BROMIDE 50 MG/5ML IV SOLN
INTRAVENOUS | Status: AC
  Filled 2012-02-21: qty 1

## 2012-02-21 MED ORDER — GLUCAGON HCL (RDNA) 1 MG IJ SOLR
INTRAMUSCULAR | Status: DC | PRN
  Administered 2012-02-21 (×2): 0.25 mg via INTRAVENOUS

## 2012-02-21 MED ORDER — DEXAMETHASONE SODIUM PHOSPHATE 4 MG/ML IJ SOLN
4.0000 mg | Freq: Once | INTRAMUSCULAR | Status: AC
  Administered 2012-02-21: 4 mg via INTRAVENOUS

## 2012-02-21 MED ORDER — FENTANYL CITRATE 0.05 MG/ML IJ SOLN: 25.0000 ug | INTRAMUSCULAR | Status: DC | PRN

## 2012-02-21 MED ORDER — GLYCOPYRROLATE 0.2 MG/ML IJ SOLN
0.2000 mg | Freq: Once | INTRAMUSCULAR | Status: AC
  Administered 2012-02-21: 0.2 mg via INTRAVENOUS

## 2012-02-21 MED ORDER — PROPOFOL 10 MG/ML IV EMUL
INTRAVENOUS | Status: AC
  Filled 2012-02-21: qty 20

## 2012-02-21 MED ORDER — LIDOCAINE HCL (PF) 1 % IJ SOLN
INTRAMUSCULAR | Status: AC
  Filled 2012-02-21: qty 5

## 2012-02-21 MED ORDER — SUCCINYLCHOLINE CHLORIDE 20 MG/ML IJ SOLN
INTRAMUSCULAR | Status: DC | PRN
  Administered 2012-02-21: 120 mg via INTRAVENOUS

## 2012-02-21 MED ORDER — MIDAZOLAM HCL 2 MG/2ML IJ SOLN
INTRAMUSCULAR | Status: AC
  Filled 2012-02-21: qty 2

## 2012-02-21 MED ORDER — SUCCINYLCHOLINE CHLORIDE 20 MG/ML IJ SOLN
INTRAMUSCULAR | Status: AC
  Filled 2012-02-21: qty 1

## 2012-02-21 MED ORDER — CEFAZOLIN SODIUM-DEXTROSE 2-3 GM-% IV SOLR
INTRAVENOUS | Status: AC
  Filled 2012-02-21: qty 50

## 2012-02-21 MED ORDER — LACTATED RINGERS IV SOLN
INTRAVENOUS | Status: DC
  Administered 2012-02-21: 1000 mL via INTRAVENOUS

## 2012-02-21 MED ORDER — CEFAZOLIN SODIUM-DEXTROSE 2-3 GM-% IV SOLR
2.0000 g | INTRAVENOUS | Status: AC
  Administered 2012-02-21: 2 g via INTRAVENOUS

## 2012-02-21 MED ORDER — URSODIOL 250 MG PO TABS: 250.0000 mg | ORAL_TABLET | Freq: Three times a day (TID) | ORAL | Status: DC

## 2012-02-21 MED ORDER — STERILE WATER FOR IRRIGATION IR SOLN
Status: DC | PRN
  Administered 2012-02-21: 09:00:00

## 2012-02-21 SURGICAL SUPPLY — 38 items
BAG HAMPER (MISCELLANEOUS) ×2 IMPLANT
BALLN RETRIEVAL 12X15 (BALLOONS) ×1 IMPLANT
BALN RTRVL 200 6-7FR 12-15 (BALLOONS) ×1
BASKET TRAPEZOID 3X6 (MISCELLANEOUS) IMPLANT
BASKET TRAPEZOID LITHO 2.0X5 (MISCELLANEOUS) ×1 IMPLANT
BLADE SAW RECIP 87.9 MT (BLADE) ×1 IMPLANT
BSKT STON RTRVL TRAPEZOID 2X5 (MISCELLANEOUS) ×1
BSKT STON RTRVL TRAPEZOID 3X6 (MISCELLANEOUS)
CATH ACCESS DELIVERY (CATHETERS) ×2 IMPLANT
DEVICE INFLATION ENCORE 26 (MISCELLANEOUS) IMPLANT
DEVICE LOCKING W-BIOPSY CAP (MISCELLANEOUS) ×2 IMPLANT
FIBER LASER SLIMLINE GI 365 (MISCELLANEOUS) ×2 IMPLANT
FORCEPS BIOP RAD 4 LRG CAP 4 (CUTTING FORCEPS) ×1 IMPLANT
FORCEPS BIOP RJ4 240 HOT (CUTTING FORCEPS) IMPLANT
FORCEPS BIOP SPYBITE 1.2X286 (FORCEP) IMPLANT
GUIDEWIRE HYDRA JAGWIRE .35 (WIRE) ×1 IMPLANT
GUIDEWIRE JAG HINI 025X260CM (WIRE) IMPLANT
KIT ROOM TURNOVER APOR (KITS) ×2 IMPLANT
LUBRICANT JELLY 4.5OZ STERILE (MISCELLANEOUS) ×2 IMPLANT
NDL HYPO 18GX1.5 BLUNT FILL (NEEDLE) ×1 IMPLANT
NEEDLE HYPO 18GX1.5 BLUNT FILL (NEEDLE) ×2 IMPLANT
NS IRRIG 500ML POUR BTL (IV SOLUTION) ×1 IMPLANT
PAD ARMBOARD 7.5X6 YLW CONV (MISCELLANEOUS) ×2 IMPLANT
POSITIONER HEAD 8X9X4 ADT (SOFTGOODS) IMPLANT
PROBE VISUALIZATION SPYGLASS (PROBE) ×2 IMPLANT
SNARE ROTATE MED OVAL 20MM (MISCELLANEOUS) ×1 IMPLANT
SPHINCTEROTOME AUTOTOME .25 (MISCELLANEOUS) ×3 IMPLANT
SPHINCTEROTOME HYDRATOME 44 (MISCELLANEOUS) ×3 IMPLANT
SPONGE GAUZE 4X4 12PLY (GAUZE/BANDAGES/DRESSINGS) ×2 IMPLANT
SYR 3ML LL SCALE MARK (SYRINGE) ×2 IMPLANT
SYR 50ML LL SCALE MARK (SYRINGE) ×3 IMPLANT
SYSTEM CONTINUOUS INJECTION (MISCELLANEOUS) ×2 IMPLANT
TUBE IRRIGATION (IRRIGATION / IRRIGATOR) ×1 IMPLANT
TUBING ENDO SMARTCAP PENTAX (MISCELLANEOUS) ×2 IMPLANT
WALLSTENT METAL COVERED 10X60 (STENTS) IMPLANT
WALLSTENT METAL COVERED 10X80 (STENTS) IMPLANT
WATER STERILE IRR 1000ML POUR (IV SOLUTION) ×3 IMPLANT
WIRE HYDRA 450CM (MISCELLANEOUS) ×1 IMPLANT

## 2012-02-21 NOTE — Transfer of Care (Signed)
Immediate Anesthesia Transfer of Care Note  Patient: [name]  Procedure(s) Performed: Procedure(s) (LRB): ENDOSCOPIC RETROGRADE CHOLANGIOPANCREATOGRAPHY (ERCP) (N/A) SPYGLASS CHOLANGIOSCOPY (N/A) BILIARY STENT PLACEMENT (N/A) ESOPHAGOGASTRODUODENOSCOPY (EGD) (N/A)  Patient Location: PACU  Anesthesia Type: General  Level of Consciousness: awake  Airway & Oxygen Therapy: Patient Spontanous Breathing and non-rebreather face mask  Post-op Assessment: Report given to PACU RN, Post -op Vital signs reviewed and stable and Patient moving all extremities  Post vital signs: Reviewed and stable  Complications: No apparent anesthesia complications

## 2012-02-21 NOTE — Anesthesia Preprocedure Evaluation (Signed)
Anesthesia Evaluation  Patient identified by MRN, date of birth, ID band Patient awake    Reviewed: Allergy & Precautions, H&P , NPO status , Patient's Chart, lab work & pertinent test results, reviewed documented beta blocker date and time   Airway Mallampati: II TM Distance: >3 FB Neck ROM: Full    Dental  (+) Chipped,    Pulmonary  breath sounds clear to auscultation        Cardiovascular Rhythm:Regular     Neuro/Psych    GI/Hepatic GERD-  Controlled and Medicated,  Endo/Other    Renal/GU Prostate CA      Musculoskeletal  (+) Arthritis - (chronic LBP, L leg numbness),   Abdominal   Peds  Hematology   Anesthesia Other Findings   Reproductive/Obstetrics                           Anesthesia Physical Anesthesia Plan  ASA: II  Anesthesia Plan: General   Post-op Pain Management:    Induction: Intravenous, Rapid sequence and Cricoid pressure planned  Airway Management Planned: Oral ETT  Additional Equipment:   Intra-op Plan:   Post-operative Plan: Extubation in OR  Informed Consent: I have reviewed the patients History and Physical, chart, labs and discussed the procedure including the risks, benefits and alternatives for the proposed anesthesia with the patient or authorized representative who has indicated his/her understanding and acceptance.     Plan Discussed with:   Anesthesia Plan Comments:         Anesthesia Quick Evaluation

## 2012-02-21 NOTE — Anesthesia Procedure Notes (Signed)
Procedure Name: Intubation Date/Time: 02/21/2012 7:49 AM Performed by: Franco Nones Pre-anesthesia Checklist: Patient identified, Patient being monitored, Timeout performed, Emergency Drugs available and Suction available Patient Re-evaluated:Patient Re-evaluated prior to inductionOxygen Delivery Method: Circle System Utilized Preoxygenation: Pre-oxygenation with 100% oxygen Intubation Type: IV induction, Rapid sequence and Cricoid Pressure applied Laryngoscope Size: Miller and 2 Grade View: Grade I Tube type: Oral Tube size: 7.0 mm Number of attempts: 1 Airway Equipment and Method: stylet Placement Confirmation: ETT inserted through vocal cords under direct vision,  positive ETCO2 and breath sounds checked- equal and bilateral Secured at: 21 cm Tube secured with: Tape Dental Injury: Teeth and Oropharynx as per pre-operative assessment

## 2012-02-21 NOTE — H&P (Signed)
[name] is an 71 y.o. male.   Chief Complaint: Patient is here for EGD and ERCP. HPI: This 71 year old Caucasian male was had intermittent right upper quadrant pain for several months who was admitted 6 weeks ago to this facility with cholangitis. He was treated with antibiotics and underwent an ERCP with stenting of 01/07/2012 with resolution of his symptoms and normalization of his LFTs. He had LFTs on 01/16/2012 these are normal except borderline alkaline phosphatase of 118. He is feeling much better but he continues to complain of intermittent fleeting squeezing pain midepigastric region and he has dull aching pain right upper quadrant laterally. He has history of peptic ulcer disease. His heartburn is well controlled with double dose PPI. He has good appetite. He states he may have lost a few pounds in the last 2 months. He denies melena rectal bleeding or diarrhea. He does not take any NSAIDs..  Past Medical History  Diagnosis Date  . Arthritis   . Numbness     left leg after back surgery  . Chronic back pain   . GERD (gastroesophageal reflux disease)   . Hx of adenomatous colonic polyps   . Schatzki's ring     History of  . Helicobacter pylori gastritis     Treated  . IBS (irritable bowel syndrome)   . Prostate ca     seed implants    Past Surgical History  Procedure Date  . Appendectomy age 49  . Back surgery 02/08    [facility]  . Back surgery 09/24/04    lumbar, [facility]  . Back surgery 09/18/02    [facility]  . Cholecystectomy 03/04/05    [facility], [facility]. Gangrenous cholecystitis complicated by abscess requiring percutaneous drainage. He also had common duct stones requiring ERCP and sphincterotomy.  . Ercp 03/02/05    [facility], [facility]. Normal-appearing biliary tree (gallbladder not image), status post sphincterotomy with recovery of small pieces of stone material, status post balloon occlusion cholangiogram.  . Cataract extraction w/phaco 11/22/2010    Procedure: CATARACT EXTRACTION PHACO AND  INTRAOCULAR LENS PLACEMENT (IOC);  Surgeon: Susa Simmonds;  Location: [facility];  Service: Ophthalmology;  Laterality: Right;  CDE: 6.81  . Esophagogastroduodenoscopy August 2005    Erosive esophagitis and Schatzki ring, small hiatal hernia  . Colonoscopy August 2005    Scattered sigmoid diverticulosis, splenic flexure polyp. Hyperplastic  . Colonoscopy 1997    3 cm tubular adenoma and a sending colon  . Incomplete colonoscopy December 2009    Left-sided diverticula, mid descending colon polyp, due to recurrent looping and redundancy exam was incomplete. It was felt that the mid colon was reached. Followup barium enema showed colon interposition between the liver and the diaphragm, redundant sigmoid colon but no colon mass or polyp identified.. Pathology revealed tubular adenoma.  . Esophagogastroduodenoscopy 01/2008    Mild reflux esophagitis, small hiatal hernia  . Colonoscopy 02/01/2011    Rourk-friable anal canal, hyperplastic rectal polyp  . Esophagogastroduodenoscopy 02/01/2011    Rourk-erosive reflux esophagitis, small hiatal hernia  . Esophagogastroduodenoscopy 10/26/2011    Dr. Kerby Moors gastric submucosal petechia (bx-benign ulceration), juxta ampullary duodenal diverticulum and some mucosal edema involving 1st/2nd portion of duodenum with superficial erosions (bx-superficial ulceration/benign)  . Ercp 01/07/2012    Procedure: ENDOSCOPIC RETROGRADE CHOLANGIOPANCREATOGRAPHY (ERCP);  Surgeon: Malissa Hippo, MD;  Location: [facility];  Service: Endoscopy;  Laterality: N/A;  possible biliary stenting  . Sphincterotomy 01/07/2012    Procedure: SPHINCTEROTOMY;  Surgeon: Malissa Hippo, MD;  Location: [facility];  Service: Endoscopy;  Laterality: N/A;  Extended  . Biliary stent placement 01/07/2012    Procedure: BILIARY STENT PLACEMENT;  Surgeon: Malissa Hippo, MD;  Location: [facility];  Service: Endoscopy;  Laterality: N/A;    Family History  Problem Relation Age of Onset  . Hypotension  Neg Hx   . Anesthesia problems Neg Hx   . Malignant hyperthermia Neg Hx   . Pseudochol deficiency Neg Hx   . Diabetes Mother   . Coronary artery disease Father   . Coronary artery disease Mother   . Diabetes Father   . Stomach cancer Father   . Colon cancer Neg Hx   . Deep vein thrombosis Daughter    Social History:  reports that he has never smoked. He has never used smokeless tobacco. He reports that he does not drink alcohol or use illicit drugs.  Allergies: No Known Allergies  Medications Prior to Admission  Medication Sig Dispense Refill  . pantoprazole (PROTONIX) 40 MG tablet Take 1 tablet (40 mg total) by mouth 2 (two) times daily.  60 tablet  5    No results found for this or any previous visit (from the past 48 hour(s)). No results found.  ROS  Blood pressure 165/91, pulse 73, temperature 97.4 F (36.3 C), temperature source Oral, resp. rate 18, SpO2 94.00%. Physical Exam  Constitutional: He appears well-developed and well-nourished.  HENT:  Mouth/Throat: Oropharynx is clear and moist.  Eyes: Conjunctivae normal are normal. No scleral icterus.  Neck: No thyromegaly present.  Cardiovascular: Normal rate, regular rhythm and normal heart sounds.   No murmur heard. Respiratory: Effort normal and breath sounds normal.  GI: Soft. He exhibits no distension and no mass. There is no tenderness.  Musculoskeletal: He exhibits no edema.  Lymphadenopathy:    He has no cervical adenopathy.  Neurological: He is alert.  Skin: Skin is warm and dry.     Assessment/Plan Recurrent midepigastric and right upper quadrant pain. History of biliary stricture treated with stenting 6 weeks ago presented with cholangitis. Diagnostic EGD followed by ERCP. He would also undergo Spyglass examination and stent change and may also need stricture dilation. Patient is agreeable to proceed with these studies.  REHMAN,NAJEEB U 02/21/2012, 7:20 AM

## 2012-02-21 NOTE — OR Nursing (Signed)
Clip removed and discarded per Dr. Karilyn Cota.

## 2012-02-21 NOTE — Op Note (Signed)
NAME:  REBECCA, MOTTA NO.:  [id number]  MEDICAL RECORD NO.:  [id number]  LOCATION:  [facility]                          FACILITY:  [facility]  PHYSICIAN:  Lionel December, M.D.    DATE OF BIRTH:  [birth year]  DATE OF PROCEDURE:  02/21/2012 DATE OF DISCHARGE:  02/21/2012                              OPERATIVE REPORT   PROCEDURE:  EGD followed by ERCP with SpyGlass examination of bile duct with removal of 3 metallic clips, stone and stent exchange.  INDICATIONS:  The patient is a 71 year old Caucasian male, who was admitted to this facility with acute cholangitis 6 weeks ago and underwent biliary stenting.  He was felt to have a stricture in the junction of CHD and CBD.  He has done well since then.  However, he continues to complain of intermittent squeezing epigastric pain as well as pain at right upper quadrant laterally.  On his last EGD in September 2013, he was noted to have abnormal gastric mucosa and biopsy by Dr. Jena Gauss revealed gastric ulcer.  He is therefore undergoing EGD prior to ERCP.  Procedure risks were reviewed with the patient.  Informed consent was obtained.  MEDS FOR SEDATION/ANESTHESIA:  Please see anesthesia records for details.  FINDINGS:  Procedure was performed in the OR.  After induction of general endotracheal anesthesia, the patient  was placed in semiprone position.  Time-out was carried out.  PROCEDURE:  Esophagogastroduodenoscopy.  Pentax video duodenoscope was passed through oropharynx without any difficulty into esophagus.  Esophagus.  Mucosa of the esophagus normal.  There was focal edema noted at GE junction on the gastric site, but no erosions or ulcers noted. Two tiny islands of ectopic gastric mucosa noted proximal to GE junction.  GE junction was at 40 cm from the incisors.  Stomach.  Other than small amount of bile was empty and distended very well by insufflation.  Folds of the proximal stomach are normal. Examination of mucosa at  body, antrum, pyloric channel as well as angularis, fundus and cardia was normal.  Duodenum, bulbar and postbulbar.  Bulbar mucosa was normal.  Postbulbar mucosa and folds were normal, and duodenal diverticulum was identified along the medial wall of the duodenum.  Endoscope was withdrawn and the patient prepared for procedure.  ERCP.  Therapeutic Pentax video duodenum scope was passed via oropharynx without any difficulty into the esophagus, stomach and across the pylorus into bulb and descending duodenum.  The biliary stent was in place.  Diverticulum noted to the left of ampulla or ampulla was located on the right wall of this diverticulum inferiorly.  Stent was caught with snare and removed under fluoroscopic control.  Duodenoscope was passed again and cholangiogram obtained.  There was narrowing at the junction of common hepatic and common bile duct and there was overlying metallic clip.  At this point, SpyGlass examination of bile duct was performed.  There was a metallic clip and a stone.  SpyGlass was removed.  Dormia basket was passed through the bile duct with removal of 3 metallic clips and single pigment stone and lot of debris was removed. Biliary system was filled with dilute contrast again.  No filling  defects were identified, but there appeared to be narrowing just proximal to cystic duct takeoff.  Therefore, 10-French 7-cm long plastic stent was placed, but felt was not long enough, it was therefore removed and 10-French 9-cm long plastic stent was placed using one-step Microvasive system for biliary decompression.  The proximal margin of the stent was well above the narrow segment.  Endoscope was withdrawn. On the way out, one of the clips was caught with the biopsy forceps and removed.  The patient tolerated the procedure well.  The patient was extubated and taken to PACU for recovery.  FINAL DIAGNOSES:  Mild changes of reflux esophagitis, otherwise normal EGD.   Gastric antral ulcer has completely healed. Periampullary diverticulum. Biliary stent was removed. SpyGlass examination revealed stone and clips in the bile duct.  Three clips were removed along with a pigment stone. 10-French 9-cm biliary stent.  RECOMMENDATIONS:  Urso 250 mg p.o. b.i.d. He will continue pantoprazole at 40 mg p.o. b.i.d. The patient will return for office visit in 8 weeks and we will consider bringing back for repeat ERCP and SpyGlass examination sometime in towards end of March.          ______________________________ Lionel December, M.D.     NR/MEDQ  D:  02/21/2012  T:  02/21/2012  Job:  [id number]  cc:   Angus G. Renard Matter, MD Fax: [phone]  Dalia Heading, M.D. Fax: [phone]  R. Roetta Sessions, MD FACP [facility] [address] [place] [address]

## 2012-02-21 NOTE — Anesthesia Postprocedure Evaluation (Signed)
Anesthesia Post Note  Patient: [name]  Procedure(s) Performed: Procedure(s) (LRB): ENDOSCOPIC RETROGRADE CHOLANGIOPANCREATOGRAPHY (ERCP) (N/A) SPYGLASS CHOLANGIOSCOPY (N/A) BILIARY STENT PLACEMENT (N/A) ESOPHAGOGASTRODUODENOSCOPY (EGD) (N/A)  Anesthesia type: General  Patient location: PACU  Post pain: Pain level controlled  Post assessment: Post-op Vital signs reviewed, Patient's Cardiovascular Status Stable, Respiratory Function Stable, Patent Airway, No signs of Nausea or vomiting and Pain level controlled  Last Vitals:  Filed Vitals:   02/21/12 0940  BP: 151/84  Pulse: 74  Temp: 36.4 C  Resp: 17    Post vital signs: Reviewed and stable  Level of consciousness: awake and alert   Complications: No apparent anesthesia complications

## 2012-02-21 NOTE — Brief Op Note (Signed)
02/21/2012  9:32 AM  PATIENT:  [name]  71 y.o. male  PRE-OPERATIVE DIAGNOSIS:  common bile duct stricture  POST-OPERATIVE DIAGNOSIS:  common bile duct stricture, mild changes @ GE junction, common bile duct stone  PROCEDURE:  Procedure(s) (LRB) with comments: ENDOSCOPIC RETROGRADE CHOLANGIOPANCREATOGRAPHY (ERCP) (N/A) - common bile duct stone and clip removed SPYGLASS CHOLANGIOSCOPY (N/A) BILIARY STENT PLACEMENT (N/A) - Biliary Stent Replacement ESOPHAGOGASTRODUODENOSCOPY (EGD) (N/A)  SURGEON:  Surgeon(s) and Role:    * Malissa Hippo, MD - Primary  Findings; EGD revealed mild edema at GE junction otherwise normal examination. Biliary stent removed using snare under fluoroscopic control. Narrowing at the junction of CHD and CBD with overlying metallic clip. Spyglass examination revealed stone and a clip. Dormia basket passed through the bile duct with removal of 3 clips and a single pigment stone along with lot of debris. 10 French 7 cm long plastic stent placed but was short. Was removed in 10 French 9 cm long plastic stent placed for biliary decompression. Patient  tolerated procedures well.

## 2012-02-24 ENCOUNTER — Encounter (HOSPITAL_COMMUNITY): Payer: Self-pay | Admitting: Internal Medicine

## 2012-03-02 ENCOUNTER — Other Ambulatory Visit: Payer: Self-pay | Admitting: Orthopedic Surgery

## 2012-03-08 ENCOUNTER — Encounter (HOSPITAL_COMMUNITY): Payer: Self-pay | Admitting: Pharmacy Technician

## 2012-03-13 ENCOUNTER — Encounter (HOSPITAL_COMMUNITY): Payer: Self-pay

## 2012-03-13 ENCOUNTER — Encounter (HOSPITAL_COMMUNITY)
Admission: RE | Admit: 2012-03-13 | Discharge: 2012-03-13 | Disposition: A | Discharge status: Home / Self Care | Payer: Medicare Other | Source: Ambulatory Visit | Attending: Orthopedic Surgery | Admitting: Orthopedic Surgery

## 2012-03-13 HISTORY — DX: Adverse effect of unspecified anesthetic, initial encounter: T41.45XA

## 2012-03-13 LAB — CBC WITH DIFFERENTIAL/PLATELET
HCT: 46.1 % (ref 39.0–52.0)
Hemoglobin: 16 g/dL (ref 13.0–17.0)
Lymphocytes Relative: 25 % (ref 12–46)
Lymphs Abs: 1.2 10*3/uL (ref 0.7–4.0)
MCHC: 34.7 g/dL (ref 30.0–36.0)
Monocytes Absolute: 0.3 10*3/uL (ref 0.1–1.0)
Monocytes Relative: 7 % (ref 3–12)
Neutro Abs: 3.1 10*3/uL (ref 1.7–7.7)
Neutrophils Relative %: 66 % (ref 43–77)
RBC: 5.1 MIL/uL (ref 4.22–5.81)

## 2012-03-13 LAB — URINALYSIS, ROUTINE W REFLEX MICROSCOPIC
Glucose, UA: NEGATIVE mg/dL
Ketones, ur: NEGATIVE mg/dL
Leukocytes, UA: NEGATIVE
Nitrite: NEGATIVE
Protein, ur: NEGATIVE mg/dL
Urobilinogen, UA: 1 mg/dL (ref 0.0–1.0)

## 2012-03-13 LAB — BASIC METABOLIC PANEL
BUN: 17 mg/dL (ref 6–23)
Chloride: 99 mEq/L (ref 96–112)
GFR calc Af Amer: 81 mL/min — ABNORMAL LOW (ref >90)
GFR calc non Af Amer: 70 mL/min — ABNORMAL LOW (ref >90)
Potassium: 4 mEq/L (ref 3.5–5.1)
Sodium: 138 mEq/L (ref 135–145)

## 2012-03-13 LAB — TYPE AND SCREEN
ABO/RH(D): A POS
Antibody Screen: NEGATIVE

## 2012-03-13 LAB — PROTIME-INR: INR: 1 (ref 0.00–1.49)

## 2012-03-13 NOTE — Progress Notes (Addendum)
12:15  After interviewing patient, he was telling me that the "LEFT" knee is the one they are doing surgery on, but orders from Dr. Wadie Lessen office state "RIGHT".  I have called the office to get clarification from Catheryn Bacon at their office..DA  1545  Olegario Messier called back to clarify op site...it will be the  RIGHT...she also contacted pt and discussed it with him.Marland KitchenMarland KitchenDA

## 2012-03-13 NOTE — Pre-Procedure Instructions (Signed)
[name]  03/13/2012   Your procedure is scheduled on:  Wednesday, January 29TH   Report to [facility] at 10:45 AM.   Call this number if you have problems the morning of surgery: [phone]   Remember:   Do not eat food or drink liquids after midnight Tuesday .    Take these medicines the morning of surgery with A SIP OF WATER: Protonix   Do not wear jewelry.  Do not wear lotions, powders, or colognes. You may NOT wear deodorant.   Men may shave face and neck.   Do not bring valuables to the hospital.  Contacts, dentures or bridgework may not be worn into surgery.   Leave suitcase in the car. After surgery it may be brought to your room.  For patients admitted to the hospital, checkout time is 11:00 AM the day of discharge.   Patients discharged the day of surgery will not be allowed to drive home.   Name and phone number of your driver: [name]  -  Spouse   Special Instructions: Shower using CHG 2 nights before surgery and the night before surgery.  If you shower the day of surgery use CHG.  Use special wash - you have one bottle of CHG for all showers.  You should use approximately 1/3 of the bottle for each shower.   Please read over the following fact sheets that you were given: Pain Booklet, Coughing and Deep Breathing, Blood Transfusion Information, MRSA Information and Surgical Site Infection Prevention

## 2012-03-20 MED ORDER — CEFAZOLIN SODIUM-DEXTROSE 2-3 GM-% IV SOLR
2.0000 g | INTRAVENOUS | Status: AC
  Administered 2012-03-21: 2 g via INTRAVENOUS
  Filled 2012-03-20: qty 50

## 2012-03-20 NOTE — H&P (Signed)
HPI: Patient presents with a chief complaint of bilateral knee pain that began several years ago with no known mechanism of injury.  He reports that the left knee typically hurts more than the right however he has instability in the right knee that has caused him to fall couple of times recently.  His son recently had a knee replacement by Dr. Turner Daniels, and [name] comes in today to discuss knee replacement for himself.  He has had cortisone injections that provided him with a few days of good pain relief.  He has also had Visco supplementation injections.  The pain wakes him from sleep at night and makes his daily activities difficult.  He does not take pain medication because of the side effects.  All: None  ROS: 14 point review of systems form filled out by the patient was reviewed and was negative as it relates to the history of present illness except for: Prostate cancer in 2000. Patient denies dizziness, nausea, fever, chills, vomiting, shortness of breath, chest pain, loss of appetite, or rash.     PMH: Status post cholecystectomy and back surgery  FHx: Arthritis  SocHx: He denies use of alcohol or tobacco.  He is married and retired.  PE: Well-developed, well-nourished 72 year old male who is alert and oriented and in no acute distress.  He is 6 feet tall and weighs 240 pounds.  Awake, alert, and oriented x3.  Extraocular motion is intact.  No use of accessory respiratory muscles for breathing.   Cardiovascular exam reveals a regular rhythm.  Skin is intact without cuts, scrapes, or abrasions. Exam of the knees demonstrates a slight varus deformity on the left side and a slight valgus deformity on the right side.  Both knees have 5 flexion contractures and flex to approximately 120.  He has significant crepitance with range of motion.  His tibial tubercles are prominent consistent with Osgood Schlatter's disease.  He is neurovascularly intact.  Imaging/Tests: 4 views of both knees  demonstrate end-stage arthritis in the medial compartment of the left knee and the lateral compartment of the right knee.  He has significant calcium deposits in his patellar tendons.    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  We are recommending that he have the right knee done first to avoid any more falls.  He is interested in waiting until after Christmas to have surgery.  Risks and benefits of surgery and would like to proceed.  He was offered pain medication today, but declined.

## 2012-03-21 ENCOUNTER — Encounter (HOSPITAL_COMMUNITY): Payer: Self-pay

## 2012-03-21 ENCOUNTER — Inpatient Hospital Stay (HOSPITAL_COMMUNITY): Payer: Medicare Other | Admitting: Anesthesiology

## 2012-03-21 ENCOUNTER — Encounter (HOSPITAL_COMMUNITY): Payer: Self-pay | Admitting: Anesthesiology

## 2012-03-21 ENCOUNTER — Encounter (HOSPITAL_COMMUNITY)
Admission: RE | Disposition: A | Discharge status: Home Health | Payer: Self-pay | Source: Ambulatory Visit | Attending: Orthopedic Surgery

## 2012-03-21 ENCOUNTER — Inpatient Hospital Stay (HOSPITAL_COMMUNITY)
Admission: RE | Admit: 2012-03-21 | Discharge: 2012-03-22 | DRG: 470 | Disposition: A | Discharge status: Home Health | Payer: Medicare Other | Source: Ambulatory Visit | Attending: Orthopedic Surgery | Admitting: Orthopedic Surgery

## 2012-03-21 DIAGNOSIS — K219 Gastro-esophageal reflux disease without esophagitis: Secondary | ICD-10-CM | POA: Diagnosis present

## 2012-03-21 DIAGNOSIS — Z01818 Encounter for other preprocedural examination: Secondary | ICD-10-CM

## 2012-03-21 DIAGNOSIS — E669 Obesity, unspecified: Secondary | ICD-10-CM | POA: Diagnosis present

## 2012-03-21 DIAGNOSIS — M1710 Unilateral primary osteoarthritis, unspecified knee: Principal | ICD-10-CM | POA: Diagnosis present

## 2012-03-21 DIAGNOSIS — Z01812 Encounter for preprocedural laboratory examination: Secondary | ICD-10-CM

## 2012-03-21 DIAGNOSIS — M1711 Unilateral primary osteoarthritis, right knee: Secondary | ICD-10-CM | POA: Diagnosis present

## 2012-03-21 DIAGNOSIS — Z01810 Encounter for preprocedural cardiovascular examination: Secondary | ICD-10-CM

## 2012-03-21 DIAGNOSIS — IMO0002 Reserved for concepts with insufficient information to code with codable children: Principal | ICD-10-CM | POA: Diagnosis present

## 2012-03-21 HISTORY — PX: TOTAL KNEE ARTHROPLASTY: SHX125

## 2012-03-21 SURGERY — ARTHROPLASTY, KNEE, TOTAL
Anesthesia: Choice | Site: Knee | Laterality: Right | Wound class: Clean

## 2012-03-21 MED ORDER — ONDANSETRON HCL 4 MG/2ML IJ SOLN: 4.0000 mg | Freq: Four times a day (QID) | INTRAMUSCULAR | Status: DC | PRN

## 2012-03-21 MED ORDER — METHOCARBAMOL 500 MG PO TABS
500.0000 mg | ORAL_TABLET | Freq: Four times a day (QID) | ORAL | Status: DC | PRN
  Administered 2012-03-22: 500 mg via ORAL
  Filled 2012-03-21: qty 1

## 2012-03-21 MED ORDER — MENTHOL 3 MG MT LOZG: 1.0000 | LOZENGE | OROMUCOSAL | Status: DC | PRN

## 2012-03-21 MED ORDER — OXYCODONE HCL 5 MG PO TABS
ORAL_TABLET | ORAL | Status: AC
  Administered 2012-03-21: 5 mg
  Filled 2012-03-21: qty 1

## 2012-03-21 MED ORDER — BISACODYL 5 MG PO TBEC: 5.0000 mg | DELAYED_RELEASE_TABLET | Freq: Every day | ORAL | Status: DC | PRN

## 2012-03-21 MED ORDER — ACETAMINOPHEN 10 MG/ML IV SOLN
INTRAVENOUS | Status: AC
  Filled 2012-03-21: qty 100

## 2012-03-21 MED ORDER — OXYCODONE HCL 5 MG/5ML PO SOLN: 5.0000 mg | Freq: Once | ORAL | Status: DC | PRN

## 2012-03-21 MED ORDER — KCL IN DEXTROSE-NACL 20-5-0.45 MEQ/L-%-% IV SOLN
INTRAVENOUS | Status: DC
  Administered 2012-03-21: 20:00:00 via INTRAVENOUS
  Filled 2012-03-21 (×5): qty 1000

## 2012-03-21 MED ORDER — MIDAZOLAM HCL 5 MG/ML IJ SOLN: 2.0000 mg | Freq: Once | INTRAMUSCULAR | Status: DC

## 2012-03-21 MED ORDER — DIPHENHYDRAMINE HCL 50 MG/ML IJ SOLN: 12.5000 mg | Freq: Four times a day (QID) | INTRAMUSCULAR | Status: DC | PRN

## 2012-03-21 MED ORDER — ACETAMINOPHEN 10 MG/ML IV SOLN
1000.0000 mg | Freq: Four times a day (QID) | INTRAVENOUS | Status: AC
  Administered 2012-03-21 – 2012-03-22 (×4): 1000 mg via INTRAVENOUS
  Filled 2012-03-21 (×4): qty 100

## 2012-03-21 MED ORDER — HYDROMORPHONE HCL PF 1 MG/ML IJ SOLN
INTRAMUSCULAR | Status: AC
  Administered 2012-03-21: 0.5 mg via INTRAVENOUS
  Filled 2012-03-21: qty 1

## 2012-03-21 MED ORDER — CELECOXIB 200 MG PO CAPS
200.0000 mg | ORAL_CAPSULE | Freq: Two times a day (BID) | ORAL | Status: DC
  Administered 2012-03-21 – 2012-03-22 (×2): 200 mg via ORAL
  Filled 2012-03-21 (×3): qty 1

## 2012-03-21 MED ORDER — METOCLOPRAMIDE HCL 10 MG PO TABS: 5.0000 mg | ORAL_TABLET | Freq: Three times a day (TID) | ORAL | Status: DC | PRN

## 2012-03-21 MED ORDER — HYDROMORPHONE HCL PF 1 MG/ML IJ SOLN
INTRAMUSCULAR | Status: AC
  Filled 2012-03-21: qty 1

## 2012-03-21 MED ORDER — FENTANYL CITRATE 0.05 MG/ML IJ SOLN
INTRAMUSCULAR | Status: AC
  Filled 2012-03-21: qty 2

## 2012-03-21 MED ORDER — CEFUROXIME SODIUM 1.5 G IJ SOLR
INTRAMUSCULAR | Status: DC | PRN
  Administered 2012-03-21: 1.5 g

## 2012-03-21 MED ORDER — FENTANYL CITRATE 0.05 MG/ML IJ SOLN
100.0000 ug | Freq: Once | INTRAMUSCULAR | Status: AC
  Administered 2012-03-21: 100 ug via INTRAVENOUS

## 2012-03-21 MED ORDER — CHLORHEXIDINE GLUCONATE 4 % EX LIQD
60.0000 mL | Freq: Once | CUTANEOUS | Status: DC
Start: 2012-03-21 — End: 2012-03-21

## 2012-03-21 MED ORDER — PANTOPRAZOLE SODIUM 40 MG PO TBEC
40.0000 mg | DELAYED_RELEASE_TABLET | Freq: Every day | ORAL | Status: DC
  Administered 2012-03-21 – 2012-03-22 (×2): 40 mg via ORAL
  Filled 2012-03-21 (×2): qty 1

## 2012-03-21 MED ORDER — OXYCODONE HCL 5 MG PO TABS
5.0000 mg | ORAL_TABLET | ORAL | Status: DC | PRN
  Administered 2012-03-22: 10 mg via ORAL
  Filled 2012-03-21: qty 2

## 2012-03-21 MED ORDER — DIPHENHYDRAMINE HCL 12.5 MG/5ML PO ELIX: 12.5000 mg | ORAL_SOLUTION | Freq: Four times a day (QID) | ORAL | Status: DC | PRN

## 2012-03-21 MED ORDER — FENTANYL CITRATE 0.05 MG/ML IJ SOLN
INTRAMUSCULAR | Status: DC | PRN
  Administered 2012-03-21: 150 ug via INTRAVENOUS
  Administered 2012-03-21: 100 ug via INTRAVENOUS

## 2012-03-21 MED ORDER — NALOXONE HCL 0.4 MG/ML IJ SOLN: 0.4000 mg | INTRAMUSCULAR | Status: DC | PRN

## 2012-03-21 MED ORDER — ACETAMINOPHEN 10 MG/ML IV SOLN
1000.0000 mg | Freq: Once | INTRAVENOUS | Status: AC
  Administered 2012-03-21: 1000 mg via INTRAVENOUS

## 2012-03-21 MED ORDER — FENTANYL CITRATE 0.05 MG/ML IJ SOLN: 50.0000 ug | Freq: Once | INTRAMUSCULAR | Status: DC

## 2012-03-21 MED ORDER — DIPHENHYDRAMINE HCL 12.5 MG/5ML PO ELIX: 12.5000 mg | ORAL_SOLUTION | ORAL | Status: DC | PRN

## 2012-03-21 MED ORDER — PROPOFOL 10 MG/ML IV BOLUS
INTRAVENOUS | Status: DC | PRN
  Administered 2012-03-21: 160 mg via INTRAVENOUS

## 2012-03-21 MED ORDER — HYDROMORPHONE HCL PF 1 MG/ML IJ SOLN: 1.0000 mg | INTRAMUSCULAR | Status: DC | PRN

## 2012-03-21 MED ORDER — SODIUM CHLORIDE 0.9 % IJ SOLN: 9.0000 mL | INTRAMUSCULAR | Status: DC | PRN

## 2012-03-21 MED ORDER — OXYCODONE HCL 5 MG PO TABS: 5.0000 mg | ORAL_TABLET | Freq: Once | ORAL | Status: DC | PRN

## 2012-03-21 MED ORDER — METHOCARBAMOL 100 MG/ML IJ SOLN
500.0000 mg | Freq: Four times a day (QID) | INTRAVENOUS | Status: DC | PRN
  Filled 2012-03-21: qty 5

## 2012-03-21 MED ORDER — METOCLOPRAMIDE HCL 5 MG/ML IJ SOLN: 5.0000 mg | Freq: Three times a day (TID) | INTRAMUSCULAR | Status: DC | PRN

## 2012-03-21 MED ORDER — SODIUM CHLORIDE 0.9 % IR SOLN
Status: DC | PRN
  Administered 2012-03-21: 3000 mL
  Administered 2012-03-21: 1000 mL

## 2012-03-21 MED ORDER — HYDROMORPHONE 0.3 MG/ML IV SOLN
INTRAVENOUS | Status: AC
  Filled 2012-03-21: qty 25

## 2012-03-21 MED ORDER — ONDANSETRON HCL 4 MG PO TABS: 4.0000 mg | ORAL_TABLET | Freq: Four times a day (QID) | ORAL | Status: DC | PRN

## 2012-03-21 MED ORDER — PHENOL 1.4 % MT LIQD: 1.0000 | OROMUCOSAL | Status: DC | PRN

## 2012-03-21 MED ORDER — DIAZEPAM 5 MG/ML IJ SOLN
2.5000 mg | Freq: Once | INTRAMUSCULAR | Status: AC
  Administered 2012-03-21 (×2): 2.5 mg via INTRAVENOUS

## 2012-03-21 MED ORDER — ZOLPIDEM TARTRATE 5 MG PO TABS: 5.0000 mg | ORAL_TABLET | Freq: Every evening | ORAL | Status: DC | PRN

## 2012-03-21 MED ORDER — LACTATED RINGERS IV SOLN
INTRAVENOUS | Status: DC | PRN
  Administered 2012-03-21 (×2): via INTRAVENOUS

## 2012-03-21 MED ORDER — FLEET ENEMA 7-19 GM/118ML RE ENEM: 1.0000 | ENEMA | Freq: Once | RECTAL | Status: AC | PRN

## 2012-03-21 MED ORDER — HYDROMORPHONE 0.3 MG/ML IV SOLN
INTRAVENOUS | Status: DC
  Administered 2012-03-21: 16:00:00 via INTRAVENOUS
  Administered 2012-03-21: 5.19 mg via INTRAVENOUS
  Administered 2012-03-22: 0.9 mg via INTRAVENOUS
  Administered 2012-03-22 (×2): 0.3 mg via INTRAVENOUS
  Administered 2012-03-22: 0.1 mg via INTRAVENOUS
  Filled 2012-03-21: qty 25

## 2012-03-21 MED ORDER — CEFUROXIME SODIUM 1.5 G IJ SOLR
INTRAMUSCULAR | Status: AC
  Filled 2012-03-21: qty 1.5

## 2012-03-21 MED ORDER — BUPIVACAINE-EPINEPHRINE PF 0.5-1:200000 % IJ SOLN
INTRAMUSCULAR | Status: DC | PRN
  Administered 2012-03-21: 25 mL

## 2012-03-21 MED ORDER — METHOCARBAMOL 100 MG/ML IJ SOLN
500.0000 mg | INTRAVENOUS | Status: AC
  Administered 2012-03-21: 500 mg via INTRAVENOUS
  Filled 2012-03-21: qty 5

## 2012-03-21 MED ORDER — ROCURONIUM BROMIDE 100 MG/10ML IV SOLN
INTRAVENOUS | Status: DC | PRN
  Administered 2012-03-21: 50 mg via INTRAVENOUS

## 2012-03-21 MED ORDER — LIDOCAINE HCL (CARDIAC) 20 MG/ML IV SOLN
INTRAVENOUS | Status: DC | PRN
  Administered 2012-03-21: 50 mg via INTRAVENOUS

## 2012-03-21 MED ORDER — DIAZEPAM 5 MG/ML IJ SOLN
INTRAMUSCULAR | Status: AC
  Administered 2012-03-21: 2.5 mg via INTRAVENOUS
  Filled 2012-03-21: qty 2

## 2012-03-21 MED ORDER — DEXAMETHASONE SODIUM PHOSPHATE 4 MG/ML IJ SOLN
INTRAMUSCULAR | Status: DC | PRN
  Administered 2012-03-21: 4 mg

## 2012-03-21 MED ORDER — MIDAZOLAM HCL 2 MG/2ML IJ SOLN
INTRAMUSCULAR | Status: AC
  Filled 2012-03-21: qty 2

## 2012-03-21 MED ORDER — MIDAZOLAM HCL 2 MG/2ML IJ SOLN
1.0000 mg | INTRAMUSCULAR | Status: DC | PRN
  Administered 2012-03-21: 2 mg via INTRAVENOUS

## 2012-03-21 MED ORDER — ASPIRIN EC 325 MG PO TBEC
325.0000 mg | DELAYED_RELEASE_TABLET | Freq: Two times a day (BID) | ORAL | Status: DC
  Administered 2012-03-21 – 2012-03-22 (×2): 325 mg via ORAL
  Filled 2012-03-21 (×3): qty 1

## 2012-03-21 MED ORDER — CEFUROXIME SODIUM 750 MG IJ SOLR
INTRAMUSCULAR | Status: DC | PRN
  Administered 2012-03-21: 750 mg

## 2012-03-21 MED ORDER — ACETAMINOPHEN 650 MG RE SUPP: 650.0000 mg | Freq: Four times a day (QID) | RECTAL | Status: DC | PRN

## 2012-03-21 MED ORDER — HYDROMORPHONE HCL PF 1 MG/ML IJ SOLN
0.2500 mg | INTRAMUSCULAR | Status: DC | PRN
  Administered 2012-03-21 (×6): 0.5 mg via INTRAVENOUS

## 2012-03-21 MED ORDER — ACETAMINOPHEN 325 MG PO TABS: 650.0000 mg | ORAL_TABLET | Freq: Four times a day (QID) | ORAL | Status: DC | PRN

## 2012-03-21 MED ORDER — DEXTROSE-NACL 5-0.45 % IV SOLN: INTRAVENOUS | Status: DC

## 2012-03-21 MED ORDER — PROMETHAZINE HCL 25 MG/ML IJ SOLN: 6.2500 mg | INTRAMUSCULAR | Status: DC | PRN

## 2012-03-21 MED ORDER — MAGNESIUM HYDROXIDE 400 MG/5ML PO SUSP: 30.0000 mL | Freq: Every day | ORAL | Status: DC | PRN

## 2012-03-21 MED ORDER — CEFUROXIME SODIUM 750 MG IJ SOLR
INTRAMUSCULAR | Status: AC
  Filled 2012-03-21: qty 750

## 2012-03-21 MED ORDER — LABETALOL HCL 5 MG/ML IV SOLN
INTRAVENOUS | Status: DC | PRN
  Administered 2012-03-21 (×2): 10 mg via INTRAVENOUS

## 2012-03-21 MED ORDER — ALUM & MAG HYDROXIDE-SIMETH 200-200-20 MG/5ML PO SUSP: 30.0000 mL | ORAL | Status: DC | PRN

## 2012-03-21 SURGICAL SUPPLY — 53 items
BANDAGE ESMARK 6X9 LF (GAUZE/BANDAGES/DRESSINGS) ×1 IMPLANT
BLADE SAG 18X100X1.27 (BLADE) ×2 IMPLANT
BLADE SAW SGTL 13X75X1.27 (BLADE) ×2 IMPLANT
BLADE SURG ROTATE 9660 (MISCELLANEOUS) IMPLANT
BNDG CMPR 9X6 STRL LF SNTH (GAUZE/BANDAGES/DRESSINGS) ×1
BNDG CMPR MED 10X6 ELC LF (GAUZE/BANDAGES/DRESSINGS) ×1
BNDG ELASTIC 6X10 VLCR STRL LF (GAUZE/BANDAGES/DRESSINGS) ×2 IMPLANT
BNDG ESMARK 6X9 LF (GAUZE/BANDAGES/DRESSINGS) ×2
BOWL SMART MIX CTS (DISPOSABLE) ×2 IMPLANT
CEMENT HV SMART SET (Cement) ×4 IMPLANT
CLOTH BEACON ORANGE TIMEOUT ST (SAFETY) ×2 IMPLANT
COVER BACK TABLE 24X17X13 BIG (DRAPES) IMPLANT
COVER SURGICAL LIGHT HANDLE (MISCELLANEOUS) ×3 IMPLANT
CUFF TOURNIQUET SINGLE 34IN LL (TOURNIQUET CUFF) ×1 IMPLANT
CUFF TOURNIQUET SINGLE 44IN (TOURNIQUET CUFF) IMPLANT
DRAPE EXTREMITY T 121X128X90 (DRAPE) ×2 IMPLANT
DRAPE U-SHAPE 47X51 STRL (DRAPES) ×2 IMPLANT
DURAPREP 26ML APPLICATOR (WOUND CARE) ×2 IMPLANT
ELECT REM PT RETURN 9FT ADLT (ELECTROSURGICAL) ×2
ELECTRODE REM PT RTRN 9FT ADLT (ELECTROSURGICAL) ×1 IMPLANT
EVACUATOR 1/8 PVC DRAIN (DRAIN) ×2 IMPLANT
GAUZE XEROFORM 1X8 LF (GAUZE/BANDAGES/DRESSINGS) ×2 IMPLANT
GLOVE BIO SURGEON STRL SZ7 (GLOVE) ×2 IMPLANT
GLOVE BIO SURGEON STRL SZ7.5 (GLOVE) ×3 IMPLANT
GLOVE BIOGEL PI IND STRL 7.0 (GLOVE) ×1 IMPLANT
GLOVE BIOGEL PI IND STRL 8 (GLOVE) ×1 IMPLANT
GLOVE BIOGEL PI INDICATOR 7.0 (GLOVE) ×2
GLOVE BIOGEL PI INDICATOR 8 (GLOVE) ×1
GOWN PREVENTION PLUS XLARGE (GOWN DISPOSABLE) ×3 IMPLANT
GOWN STRL NON-REIN LRG LVL3 (GOWN DISPOSABLE) ×3 IMPLANT
HANDPIECE INTERPULSE COAX TIP (DISPOSABLE) ×2
HOOD PEEL AWAY FACE SHEILD DIS (HOOD) ×5 IMPLANT
KIT BASIN OR (CUSTOM PROCEDURE TRAY) ×2 IMPLANT
KIT ROOM TURNOVER OR (KITS) ×2 IMPLANT
MANIFOLD NEPTUNE II (INSTRUMENTS) ×2 IMPLANT
NS IRRIG 1000ML POUR BTL (IV SOLUTION) ×2 IMPLANT
PACK TOTAL JOINT (CUSTOM PROCEDURE TRAY) ×2 IMPLANT
PAD ARMBOARD 7.5X6 YLW CONV (MISCELLANEOUS) ×3 IMPLANT
PADDING CAST COTTON 6X4 STRL (CAST SUPPLIES) ×2 IMPLANT
SET HNDPC FAN SPRY TIP SCT (DISPOSABLE) ×1 IMPLANT
SPONGE GAUZE 4X4 12PLY (GAUZE/BANDAGES/DRESSINGS) ×3 IMPLANT
STAPLER VISISTAT 35W (STAPLE) ×2 IMPLANT
SUCTION FRAZIER TIP 10 FR DISP (SUCTIONS) ×2 IMPLANT
SUT VIC AB 0 CTX 36 (SUTURE) ×2
SUT VIC AB 0 CTX36XBRD ANTBCTR (SUTURE) ×1 IMPLANT
SUT VIC AB 1 CTX 36 (SUTURE) ×2
SUT VIC AB 1 CTX36XBRD ANBCTR (SUTURE) ×1 IMPLANT
SUT VIC AB 2-0 CT1 27 (SUTURE) ×2
SUT VIC AB 2-0 CT1 TAPERPNT 27 (SUTURE) ×1 IMPLANT
TOWEL OR 17X24 6PK STRL BLUE (TOWEL DISPOSABLE) ×2 IMPLANT
TOWEL OR 17X26 10 PK STRL BLUE (TOWEL DISPOSABLE) ×2 IMPLANT
TRAY FOLEY CATH 14FR (SET/KITS/TRAYS/PACK) ×1 IMPLANT
WATER STERILE IRR 1000ML POUR (IV SOLUTION) ×5 IMPLANT

## 2012-03-21 NOTE — Anesthesia Preprocedure Evaluation (Signed)
Anesthesia Evaluation  Patient identified by MRN, date of birth, ID band Patient awake    Reviewed: Allergy & Precautions, H&P , NPO status , Patient's Chart, lab work & pertinent test results  Airway Mallampati: II TM Distance: >3 FB Neck ROM: Full    Dental  (+) Chipped,    Pulmonary  breath sounds clear to auscultation        Cardiovascular Rhythm:Regular Rate:Normal     Neuro/Psych    GI/Hepatic GERD-  Controlled and Medicated,  Endo/Other    Renal/GU Prostate CA      Musculoskeletal  (+) Arthritis - (chronic LBP, L leg numbness),   Abdominal (+) + obese,   Peds  Hematology   Anesthesia Other Findings   Reproductive/Obstetrics                           Anesthesia Physical Anesthesia Plan  ASA: II  Anesthesia Plan: General   Post-op Pain Management:    Induction: Intravenous  Airway Management Planned: Oral ETT  Additional Equipment:   Intra-op Plan:   Post-operative Plan: Extubation in OR  Informed Consent: I have reviewed the patients History and Physical, chart, labs and discussed the procedure including the risks, benefits and alternatives for the proposed anesthesia with the patient or authorized representative who has indicated his/her understanding and acceptance.     Plan Discussed with: CRNA and Surgeon  Anesthesia Plan Comments:         Anesthesia Quick Evaluation

## 2012-03-21 NOTE — Plan of Care (Signed)
Problem: Consults Goal: Diagnosis- Total Joint Replacement Primary Total Knee Right     

## 2012-03-21 NOTE — Progress Notes (Signed)
Orthopedic Tech Progress Note Patient Details:  [name] Mar 08, 1940 [id number] Start time for cpm was 1500 pm. CPM Right Knee CPM Right Knee: On Right Knee Flexion (Degrees): 60  Right Knee Extension (Degrees): 0  Additional Comments: applied overhead frame   Jennye Moccasin 03/21/2012, 3:27 PM

## 2012-03-21 NOTE — Progress Notes (Signed)
Consulted with dr Gypsy Balsam regarding pt c/o severe pain after all pain med ordered has been given.Marland Kitchenadditional orders obtained and given

## 2012-03-21 NOTE — Op Note (Signed)
PATIENT ID:      [name]  MRN:     [id number] DOB/AGE:    73-Oct-1942 / 72 y.o.       OPERATIVE REPORT    DATE OF PROCEDURE:  03/21/2012       PREOPERATIVE DIAGNOSIS:   RIGHT KNEE OSTEOARTHRITIS      Estimated Body mass index is 32.17 kg/(m^2) as calculated from the following:   Height as of 01/16/12: 6\' 0" (1.829 m).   Weight as of 01/16/12: 237 lb 3.2 oz(107.593 kg).                                                        POSTOPERATIVE DIAGNOSIS:   RIGHT KNEE OSTEOARTHRITIS                                                                      PROCEDURE:  Procedure(s): TOTAL KNEE ARTHROPLASTY Using Depuy Sigma RP implants #5R Femur, #5Tibia, 10mm sigma RP bearing, 41 Patella     SURGEON: Lang Zingg J    ASSISTANT:   Shirl Harris PA-C   (Present and scrubbed throughout the case, critical for assistance with exposure, retraction, instrumentation, and closure.)         ANESTHESIA: GET with Femoral Nerve Block  DRAINS: foley, 2 medium hemovac in knee   TOURNIQUET TIME:   COMPLICATIONS:  None     SPECIMENS: None   INDICATIONS FOR PROCEDURE: The patient has  RIGHT KNEE OSTEOARTHRITIS, varus deformities, XR shows bone on bone arthritis. Patient has failed all conservative measures including anti-inflammatory medicines, narcotics, attempts at  exercise and weight loss, cortisone injections and viscosupplementation.  Risks and benefits of surgery have been discussed, questions answered.   DESCRIPTION OF PROCEDURE: The patient identified by armband, received  right femoral nerve block and IV antibiotics, in the holding area at [facility] Patient taken to the operating room, appropriate anesthetic  monitors were attached General endotracheal anesthesia induced with  the patient in supine position, Foley catheter was inserted. Tourniquet  applied high to the operative thigh. Lateral post and foot positioner  applied to the table, the lower extremity was then prepped and  draped  in usual sterile fashion from the ankle to the tourniquet. Time-out procedure was performed. The limb was wrapped with an Esmarch bandage and the tourniquet inflated to 350 mmHg. We began the operation by making the anterior midline incision starting at handbreadth above the patella going over the patella 1 cm medial to and  4 cm distal to the tibial tubercle. Small bleeders in the skin and the  subcutaneous tissue identified and cauterized. Transverse retinaculum was incised and reflected medially and a medial parapatellar arthrotomy was accomplished. the patella was everted and theprepatellar fat pad resected. The superficial medial collateral  ligament was then elevated from anterior to posterior along the proximal  flare of the tibia and anterior half of the menisci resected. The knee was hyperflexed exposing bone on bone arthritis. Peripheral and notch osteophytes as well as the cruciate ligaments were then resected. We continued to  work  our way around posteriorly along the proximal tibia, and externally  rotated the tibia subluxing it out from underneath the femur. A McHale  retractor was placed through the notch and a lateral Hohmann retractor  placed, and we then drilled through the proximal tibia in line with the  axis of the tibia followed by an intramedullary guide rod and 2-degree  posterior slope cutting guide. The tibial cutting guide was pinned into place  allowing resection of 10 mm of bone medially and about 6 mm of bone  laterally because of her varus deformity. Satisfied with the tibial resection, we then  entered the distal femur 2 mm anterior to the PCL origin with the  intramedullary guide rod and applied the distal femoral cutting guide  set at 11mm, with 5 degrees of valgus. This was pinned along the  epicondylar axis. At this point, the distal femoral cut was accomplished without difficulty. We then sized for a #5R femoral component and pinned the guide in 3 degrees  of external rotation.The chamfer cutting guide was pinned into place. The anterior, posterior, and chamfer cuts were accomplished without difficulty followed by  the Sigma RP box cutting guide and the box cut. We also removed posterior osteophytes from the posterior femoral condyles. At this  time, the knee was brought into full extension. We checked our  extension and flexion gaps and found them symmetric at 10mm.  The patella thickness measured at 26 mm. We set the cutting guide at 15 and removed the posterior 9.5-10 mm  of the patella sized for 41 button and drilled the lollipop. The knee  was then once again hyperflexed exposing the proximal tibia. We sized for a #5 tibial base plate, applied the smokestack and the conical reamer followed by the the Delta fin keel punch. We then hammered into place the Sigma RP trial femoral component, inserted a 10-mm trial bearing, trial patellar button, and took the knee through range of motion from 0-130 degrees. No thumb pressure was required for patellar  tracking. At this point, all trial components were removed, a double batch of DePuy HV cement with 1500 mg of Zinacef was mixed and applied to all bony metallic mating surfaces except for the posterior condyles of the femur itself. In order, we  hammered into place the tibial tray and removed excess cement, the femoral component and removed excess cement, a 10-mm Sigma RP bearing  was inserted, and the knee brought to full extension with compression.  The patellar button was clamped into place, and excess cement  removed. While the cement cured the wound was irrigated out with normal saline solution pulse lavage, and medium Hemovac drains were placed from an anterolateral  approach. Ligament stability and patellar tracking were checked and found to be excellent. The parapatellar arthrotomy was closed with  running #1 Vicryl suture. The subcutaneous tissue with 0 and 2-0 undyed  Vicryl suture, and the skin  with skin staples. A dressing of Xeroform,  4 x 4, dressing sponges, Webril, and Ace wrap applied. The patient  awakened, extubated, and taken to recovery room without difficulty.   Gean Birchwood J 03/21/2012, 2:03 PM

## 2012-03-21 NOTE — Anesthesia Procedure Notes (Signed)
Anesthesia Regional Block:  Femoral nerve block  Pre-Anesthetic Checklist: ,, timeout performed, Correct Patient, Correct Site, Correct Laterality, Correct Procedure, Correct Position, site marked, Risks and benefits discussed,  Surgical consent,  Pre-op evaluation,  At surgeon's request and post-op pain management  Laterality: Right  Prep: chloraprep       Needles:  Injection technique: Single-shot  Needle Type: Echogenic Stimulator Needle     Needle Length:cm 9 cm Needle Gauge: 22 and 22 G    Additional Needles:  Procedures: nerve stimulator Femoral nerve block  Nerve Stimulator or Paresthesia:  Response: 0.5 mA,   Additional Responses:   Narrative:  Start time: 03/21/2012 11:35 AM End time: 03/21/2012 11:46 AM Injection made incrementally with aspirations every 5 mL. Anesthesiologist: Dr Gypsy Balsam  Additional Notes: 4098-1191 R FNB POP CHG prep, sterile tech #22 stim/echo needle w/stim down tom .5ma Multiple neg asp Marc .5% w/epi 1:200000 total 25cc+decadron 4mg  infil No compl Dr Gypsy Balsam

## 2012-03-21 NOTE — Interval H&P Note (Signed)
History and Physical Interval Note:  03/21/2012 12:08 PM  [name]  has presented today for surgery, with the diagnosis of RIGHT KNEE OSTEOARTHRITIS  The various methods of treatment have been discussed with the patient and family. After consideration of risks, benefits and other options for treatment, the patient has consented to  Procedure(s) (LRB) with comments: TOTAL KNEE ARTHROPLASTY (Right) as a surgical intervention .  The patient's history has been reviewed, patient examined, no change in status, stable for surgery.  I have reviewed the patient's chart and labs.  Questions were answered to the patient's satisfaction.     [name]

## 2012-03-21 NOTE — Preoperative (Signed)
Beta Blockers   Reason not to administer Beta Blockers:Not Applicable 

## 2012-03-21 NOTE — Transfer of Care (Signed)
Immediate Anesthesia Transfer of Care Note  Patient: [name]  Procedure(s) Performed: Procedure(s) (LRB) with comments: TOTAL KNEE ARTHROPLASTY (Right) - right knee arthroplasty  Patient Location: PACU  Anesthesia Type:General  Level of Consciousness: awake, alert  and oriented  Airway & Oxygen Therapy: Patient Spontanous Breathing and Patient connected to nasal cannula oxygen  Post-op Assessment: Report given to PACU RN, Post -op Vital signs reviewed and stable and Patient moving all extremities  Post vital signs: Reviewed and stable  Complications: No apparent anesthesia complications

## 2012-03-21 NOTE — Progress Notes (Signed)
Report ginen to elise rn as caregiver

## 2012-03-22 DIAGNOSIS — M1711 Unilateral primary osteoarthritis, right knee: Secondary | ICD-10-CM | POA: Diagnosis present

## 2012-03-22 LAB — BASIC METABOLIC PANEL
BUN: 18 mg/dL (ref 6–23)
Chloride: 97 mEq/L (ref 96–112)
Creatinine, Ser: 0.89 mg/dL (ref 0.50–1.35)
GFR calc Af Amer: >90 mL/min (ref >90)
GFR calc non Af Amer: 83 mL/min — ABNORMAL LOW (ref >90)

## 2012-03-22 LAB — CBC
HCT: 38 % — ABNORMAL LOW (ref 39.0–52.0)
MCHC: 33.9 g/dL (ref 30.0–36.0)
MCV: 90.7 fL (ref 78.0–100.0)
RDW: 13.3 % (ref 11.5–15.5)

## 2012-03-22 MED ORDER — ASPIRIN 325 MG PO TBEC: 325.0000 mg | DELAYED_RELEASE_TABLET | Freq: Two times a day (BID) | ORAL | Status: DC

## 2012-03-22 NOTE — Progress Notes (Signed)
CARE MANAGEMENT NOTE 03/22/2012  Patient:  [name], [name]   Account Number:  [id number]  Date Initiated:  03/22/2012  Documentation initiated by:  Vance Peper  Subjective/Objective Assessment:   72 yr old male s/p right total knee arthroplasty.     Action/Plan:   CM spoke with patient and wife concerning home health and DME needs. Choice offered. patient has rolling walker, 3in1 and CPM.   Anticipated DC Date:  03/23/2012   Anticipated DC Plan:        DC Planning Services  CM consult      [facility] Choice  HOME HEALTH   Choice offered to / List presented to:  C-1 Patient        HH arranged  HH-1 RN  HH-2 PT      HH agency  [facility]   Status of service:  Completed, signed off Medicare Important Message given?   (If response is "NO", the following Medicare IM given date fields will be blank) Date Medicare IM given:   Date Additional Medicare IM given:    Discharge Disposition:  HOME W HOME HEALTH SERVICES  Per UR Regulation:    If discussed at Long Length of Stay Meetings, dates discussed:    Comments:

## 2012-03-22 NOTE — Evaluation (Signed)
Physical Therapy Evaluation Patient Details Name: [name] MRN: [id number] DOB: 12/21/1940 Today's Date: 03/22/2012 Time: 0825-0905 PT Time Calculation (min): 40 min  PT Assessment / Plan / Recommendation Clinical Impression  72 y.o. male POD #1 for R TKA. Pt ambulated 120' with RW, excellent progress expected. Pt would benefit from acute PT to maximize safety and independence with mobility. HHPT recommended, no DME needed.     PT Assessment  Patient needs continued PT services    Follow Up Recommendations  Home health PT    Does the patient have the potential to tolerate intense rehabilitation      Barriers to Discharge        Equipment Recommendations  None recommended by PT    Recommendations for Other Services OT consult   Frequency 7X/week    Precautions / Restrictions Precautions Precautions: Knee Restrictions Weight Bearing Restrictions: No Other Position/Activity Restrictions: WBAT   Pertinent Vitals/Pain **4/10 R knee with activity Ice applied, pt used PCA prior to PT*      Mobility  Bed Mobility Bed Mobility: Supine to Sit Supine to Sit: 4: Min assist;HOB elevated Details for Bed Mobility Assistance: min A for RLE Transfers Transfers: Sit to Stand;Stand to Sit Sit to Stand: 4: Min assist;From bed;With upper extremity assist Stand to Sit: 4: Min assist;To chair/3-in-1;With upper extremity assist Ambulation/Gait Ambulation/Gait Assistance: 5: Supervision Ambulation Distance (Feet): 120 Feet Assistive device: Rolling walker Gait Pattern: Step-to pattern General Gait Details: VCs sequencing and for flexed posture    Shoulder Instructions     Exercises Total Joint Exercises Ankle Circles/Pumps: AROM;Both;10 reps Quad Sets: AROM;Both;5 reps Heel Slides: AAROM;Right;5 reps;Supine Long Arc Quad: AAROM;Right;5 reps;Seated Knee Flexion: AAROM;Right;5 reps;Seated   PT Diagnosis: Difficulty walking;Acute pain  PT Problem List: Decreased range of  motion;Decreased strength;Pain;Decreased mobility PT Treatment Interventions: DME instruction;Gait training;Stair training;Therapeutic exercise;Patient/family education   PT Goals Acute Rehab PT Goals PT Goal Formulation: With patient Time For Goal Achievement: 03/29/12 Potential to Achieve Goals: Good Pt will go Supine/Side to Sit: Independently PT Goal: Supine/Side to Sit - Progress: Goal set today Pt will go Sit to Stand: with modified independence PT Goal: Sit to Stand - Progress: Goal set today Pt will Ambulate: >150 feet;with modified independence;with rolling walker PT Goal: Ambulate - Progress: Goal set today Pt will Go Up / Down Stairs: 1-2 stairs;with min assist PT Goal: Up/Down Stairs - Progress: Goal set today Pt will Perform Home Exercise Program: Independently PT Goal: Perform Home Exercise Program - Progress: Goal set today  Visit Information  Last PT Received On: 03/22/12 Assistance Needed: +1    Subjective Data  Subjective: I want to do what's needed to get this knee moving.  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, contracting Living Lives With: Spouse Available Help at Discharge: Family Type of Home: House Home Access: Stairs to enter Secretary/administrator of Steps: 2 Entrance Stairs-Rails: Right Home Layout: One level Home Adaptive Equipment: Bedside commode/3-in-1;Shower chair without back;Walker - rolling Prior Function Level of Independence: Independent Able to Take Stairs?: Yes Driving: Yes Vocation: Part time employment Comments: Barrister's clerk, retired from [employer] Communication: No difficulties    Cognition  Overall Cognitive Status: Appears within functional limits for tasks assessed/performed Arousal/Alertness: Awake/alert Orientation Level: Appears intact for tasks assessed Behavior During Session: [facility] for tasks performed    Extremity/Trunk Assessment Right Upper Extremity  Assessment RUE ROM/Strength/Tone: [facility] for tasks assessed Left Upper Extremity Assessment LUE ROM/Strength/Tone: [facility]  for tasks assessed Right Lower Extremity Assessment RLE ROM/Strength/Tone: Deficits;Due to pain RLE ROM/Strength/Tone Deficits: knee flexion AAROM approx 50*, knee ext -2/5, SLR -2/5, ankle WFL RLE Sensation: WFL - Light Touch RLE Coordination: WFL - gross/fine motor Left Lower Extremity Assessment LLE ROM/Strength/Tone: Within functional levels LLE Sensation: WFL - Light Touch LLE Coordination: WFL - gross/fine motor Trunk Assessment Trunk Assessment: Normal   Balance    End of Session PT - End of Session Activity Tolerance: Patient tolerated treatment well Patient left: in chair Nurse Communication: Mobility status  GP     Ralene Bathe Kistler 03/22/2012, 9:13 AM  [phone]

## 2012-03-22 NOTE — Discharge Summary (Signed)
Patient ID: [name] MRN: [id number] DOB/AGE: 1940-04-11 72 y.o.  Admit date: 03/21/2012 Discharge date: 03/22/2012  Admission Diagnoses:  Active Problems:  Osteoarthritis of right knee   Discharge Diagnoses:  Same  Past Medical History  Diagnosis Date  . Arthritis   . Numbness     left leg after back surgery  . Chronic back pain   . GERD (gastroesophageal reflux disease)   . Hx of adenomatous colonic polyps   . Schatzki's ring     History of  . Helicobacter pylori gastritis     Treated  . IBS (irritable bowel syndrome)   . Prostate ca     seed implants  . Complication of anesthesia     "hard to wake up after back surgery"    Surgeries: Procedure(s): TOTAL KNEE ARTHROPLASTY on 03/21/2012   Consultants:    Discharged Condition: Improved  Hospital Course: [name] is an 72 y.o. male who was admitted 03/21/2012 for operative treatment of<principal problem not specified>. Patient has severe unremitting pain that affects sleep, daily activities, and work/hobbies. After pre-op clearance the patient was taken to the operating room on 03/21/2012 and underwent  Procedure(s): TOTAL KNEE ARTHROPLASTY.    Patient was given perioperative antibiotics: Anti-infectives     Start     Dose/Rate Route Frequency Ordered Stop   03/21/12 1342   cefUROXime (ZINACEF) injection  Status:  Discontinued          As needed 03/21/12 1343 03/21/12 1422   03/21/12 1330   cefUROXime (ZINACEF) injection  Status:  Discontinued          As needed 03/21/12 1331 03/21/12 1422   03/21/12 0600   ceFAZolin (ANCEF) IVPB 2 g/50 mL premix        2 g 100 mL/hr over 30 Minutes Intravenous On call to O.R. 03/20/12 1426 03/21/12 1208           Patient was given sequential compression devices, early ambulation, and chemoprophylaxis to prevent DVT.  Patient benefited maximally from hospital stay and there were no complications.    Recent vital signs: Patient Vitals for the past 24 hrs:  BP Temp  Temp src Pulse Resp SpO2  03/22/12 1408 136/65 mmHg 98.9 F (37.2 C) - 81  20  100 %  03/22/12 1200 - - - - 18  -  03/22/12 1129 - - - - - 94 %  03/22/12 0747 - - - - 18  -  03/22/12 0611 126/69 mmHg 97.8 F (36.6 C) - 92  20  92 %  03/22/12 0519 - - - - 10  93 %  03/22/12 0430 - - - - 16  91 %  03/22/12 0417 145/61 mmHg 98.1 F (36.7 C) - 82  22  94 %  03/22/12 0005 - - - - 17  98 %  03/21/12 2100 - - - - - 95 %  03/21/12 2046 122/69 mmHg 97.6 F (36.4 C) - 91  20  91 %  03/21/12 1735 153/83 mmHg 97.3 F (36.3 C) Oral 74  18  100 %  03/21/12 1700 166/66 mmHg 98 F (36.7 C) - 73  18  100 %  03/21/12 1645 173/76 mmHg - - 68  19  98 %  03/21/12 1630 168/81 mmHg - - 78  24  100 %  03/21/12 1615 171/76 mmHg - - 75  23  98 %  03/21/12 1600 175/87 mmHg - - 63  11  100 %  03/21/12  1545 165/85 mmHg - - 79  20  100 %  03/21/12 1530 162/76 mmHg - - 65  10  100 %  03/21/12 1515 177/85 mmHg - - 65  12  100 %  03/21/12 1500 171/81 mmHg - - 73  17  100 %     Recent laboratory studies:  [facility] 03/22/12 0525  WBC 8.6  HGB 12.9*  HCT 38.0*  PLT 145*  NA 135  K 3.8  CL 97  CO2 25  BUN 18  CREATININE 0.89  GLUCOSE 194*  INR --  CALCIUM 8.6     Discharge Medications:     Medication List     As of 03/22/2012  2:49 PM    TAKE these medications         aspirin 325 MG EC tablet   Take 1 tablet (325 mg total) by mouth 2 (two) times daily.      pantoprazole 40 MG tablet   Commonly known as: PROTONIX   Take 40 mg by mouth daily.        Diagnostic Studies: No results found.  Disposition: 01-Home or Self Care      Discharge Orders    Future Appointments: Provider: Department: Dept Phone: Center:   04/17/2012 10:00 AM Malissa Hippo, MD [facility] [phone] None     Future Orders Please Complete By Expires   Increase activity slowly      Walker       May shower / Bathe      Driving Restrictions      Comments:   No driving for 2 weeks.    Change dressing (specify)      Comments:   Dressing change as needed.   Call MD for:  temperature >100.4      Call MD for:  severe uncontrolled pain      Call MD for:  redness, tenderness, or signs of infection (pain, swelling, redness, odor or green/yellow discharge around incision site)      Discharge instructions      Comments:   F/U with Dr. Turner Daniels as scheduled (POD #[id number])         Signed: Hazle Nordmann. 03/22/2012, 2:49 PM

## 2012-03-22 NOTE — Progress Notes (Signed)
UR COMPLETED  

## 2012-03-22 NOTE — Progress Notes (Signed)
D/C instructions and script for ASA given. Pt to pick up script for pain med at MDs office. Pt verbalized understanding of D/C instructions. Family to take Pt home.

## 2012-03-22 NOTE — Progress Notes (Signed)
Physical Therapy Treatment Patient Details Name: [name] MRN: [id number] DOB: 09-07-1940 Today's Date: 03/22/2012 Time: 2130-8657 PT Time Calculation (min): 25 min  PT Assessment / Plan / Recommendation Comments on Treatment Session  Pt progressing well with increased distance and stair training. Good return demo with pt and son.  Pt and family reports being  comfortable with D/C home as son recently had TKA.     Follow Up Recommendations  Home health PT     Does the patient have the potential to tolerate intense rehabilitation     Barriers to Discharge        Equipment Recommendations  None recommended by PT    Recommendations for Other Services    Frequency 7X/week   Plan Discharge plan remains appropriate;Frequency remains appropriate    Precautions / Restrictions Precautions Precautions: Knee Restrictions Weight Bearing Restrictions: No RLE Weight Bearing: Weight bearing as tolerated   Pertinent Vitals/Pain no apparent distress     Mobility  Transfers Sit to Stand: From chair/3-in-1;4: Min guard;With upper extremity assist Stand to Sit: 4: Min guard;To chair/3-in-1;With upper extremity assist Details for Transfer Assistance: No phyaical assistance needed to sit or stand. Cueing for safety and hand placement. Ambulation/Gait Ambulation/Gait Assistance: 5: Supervision Ambulation Distance (Feet): 200 Feet Assistive device: Rolling walker Gait Pattern: Step-to pattern;Trunk flexed General Gait Details: Pt presents with flexed posture but reports it to be baseline secondary to previous back surgeries. Stairs: Yes Stairs Assistance: 4: Min Editor, commissioning Details (indicate cue type and reason): Cueing for gait sequence and safety Stair Management Technique: Backwards Number of Stairs: 1  (x2)    Exercises Total Joint Exercises Ankle Circles/Pumps: AROM;10 reps;Both Quad Sets: AROM;Both;10 reps Hip ABduction/ADduction: AAROM;Right;10 reps Long Arc  Quad: AAROM;Right;10 reps Knee Flexion: AAROM;Right;10 reps   PT Diagnosis:    PT Problem List:   PT Treatment Interventions:     PT Goals Acute Rehab PT Goals PT Goal: Supine/Side to Sit - Progress: Progressing toward goal PT Goal: Sit to Stand - Progress: Progressing toward goal PT Goal: Ambulate - Progress: Progressing toward goal PT Goal: Up/Down Stairs - Progress: Met PT Goal: Perform Home Exercise Program - Progress: Progressing toward goal  Visit Information  Last PT Received On: 03/22/12 Assistance Needed: +1    Subjective Data      Cognition  Overall Cognitive Status: Appears within functional limits for tasks assessed/performed Arousal/Alertness: Awake/alert Orientation Level: Appears intact for tasks assessed Behavior During Session: [facility] for tasks performed    Balance     End of Session PT - End of Session Equipment Utilized During Treatment: Gait belt Activity Tolerance: Patient tolerated treatment well Patient left: in chair;with call bell/phone within reach;with family/visitor present Nurse Communication: Mobility status CPM Right Knee CPM Right Knee: Off   GP     Lazaro Arms 03/22/2012, 1:58 PM

## 2012-03-22 NOTE — Progress Notes (Signed)
Patient ID: [name], male   DOB: [birth year], 72 y.o.   MRN: [id number] PATIENT ID: [name]  MRN: [id number]  DOB/AGE:  14-Dec-1940 / 72 y.o.  1 Day Post-Op Procedure(s) (LRB): TOTAL KNEE ARTHROPLASTY (Right)    PROGRESS NOTE Subjective: Patient is alert, oriented, no Nausea, no Vomiting, yes passing gas, no Bowel Movement. Taking PO sips. Denies SOB, Chest or Calf Pain. Using Incentive Spirometer, PAS in place. Ambulate Walked this AM, CPM 0-60 Patient reports pain as 7 on 0-10 scale  .    Objective: Vital signs in last 24 hours: Filed Vitals:   03/22/12 0430 03/22/12 0519 03/22/12 0611 03/22/12 0747  BP:   126/69   Pulse:   92   Temp:   97.8 F (36.6 C)   TempSrc:      Resp: 16 10 20 18   SpO2: 91% 93% 92%       Intake/Output from previous day: I/O last 3 completed shifts: In: 3315 [P.O.:240; I.V.:3000; Other:75] Out: 1225 [Urine:1200; Blood:25]   Intake/Output this shift:     LABORATORY DATA:  Basename 03/22/12 0525  WBC 8.6  HGB 12.9*  HCT 38.0*  PLT 145*  NA 135  K 3.8  CL 97  CO2 25  BUN 18  CREATININE 0.89  GLUCOSE 194*  GLUCAP --  INR --  CALCIUM 8.6    Examination: Neurologically intact ABD soft Neurovascular intact Sensation intact distally Intact pulses distally Dorsiflexion/Plantar flexion intact Incision: no drainage No cellulitis present Compartment soft} Blood and plasma separated in drain indicating minimal recent drainage, drain pulled without difficulty.  Assessment:   1 Day Post-Op Procedure(s) (LRB): TOTAL KNEE ARTHROPLASTY (Right) ADDITIONAL DIAGNOSIS:    Plan: PT/OT WBAT, CPM 5/hrs day until ROM 0-90 degrees, then D/C CPM DVT Prophylaxis:  SCDx72hrs, ASA 325 mg BID x 2 weeks DISCHARGE PLAN: Home DISCHARGE NEEDS: HHPT, HHRN, CPM, Walker and 3-in-1 comode seat     Dariah Mcsorley J 03/22/2012, 9:04 AM

## 2012-03-22 NOTE — Progress Notes (Signed)
Seen and agreed  03/22/2012 Robinette, Julia Elizabeth PTA [phone] pager [phone] office    

## 2012-03-22 NOTE — Anesthesia Postprocedure Evaluation (Signed)
  Anesthesia Post-op Note  Patient: [name]  Procedure(s) Performed: Procedure(s) (LRB) with comments: TOTAL KNEE ARTHROPLASTY (Right) - right knee arthroplasty  Patient Location: PACU  Anesthesia Type:GA combined with regional for post-op pain  Level of Consciousness: awake  Airway and Oxygen Therapy: Patient Spontanous Breathing  Post-op Pain: mild  Post-op Assessment: Post-op Vital signs reviewed, Patient's Cardiovascular Status Stable, Respiratory Function Stable, Patent Airway, No signs of Nausea or vomiting and Pain level controlled  Post-op Vital Signs: stable  Complications: No apparent anesthesia complications

## 2012-03-23 ENCOUNTER — Encounter (HOSPITAL_COMMUNITY): Payer: Self-pay | Admitting: Orthopedic Surgery

## 2012-04-17 ENCOUNTER — Encounter (INDEPENDENT_AMBULATORY_CARE_PROVIDER_SITE_OTHER): Payer: Self-pay | Admitting: Internal Medicine

## 2012-04-17 ENCOUNTER — Other Ambulatory Visit (INDEPENDENT_AMBULATORY_CARE_PROVIDER_SITE_OTHER): Payer: Self-pay | Admitting: *Deleted

## 2012-04-17 ENCOUNTER — Ambulatory Visit (INDEPENDENT_AMBULATORY_CARE_PROVIDER_SITE_OTHER): Payer: Medicare Other | Admitting: Internal Medicine

## 2012-04-17 ENCOUNTER — Encounter (INDEPENDENT_AMBULATORY_CARE_PROVIDER_SITE_OTHER): Payer: Self-pay | Admitting: *Deleted

## 2012-04-17 VITALS — BP 120/68 | HR 78 | Temp 98.1°F | Resp 18 | Ht 74.0 in | Wt 235.6 lb

## 2012-04-17 DIAGNOSIS — K8051 Calculus of bile duct without cholangitis or cholecystitis with obstruction: Secondary | ICD-10-CM

## 2012-04-17 DIAGNOSIS — K8050 Calculus of bile duct without cholangitis or cholecystitis without obstruction: Secondary | ICD-10-CM

## 2012-04-17 NOTE — Progress Notes (Signed)
Presenting complaint;  History of biliary tract disease. History of choledocholithiasis.  Subjective:  Patient is 72 year old Caucasian male who presented with signs and symptoms of cholangitis in November 2013 and underwent ERCP and felt to have stricture at junction of CHD and CBD. He was stented at the time. He returned for elective ERCP on 02/21/2012. Cholangioscopy revealed metallic clip with stone around it. I was able to remove 3 clips and a stone. Bile duct was stented again. He feels better. He is still having intermittent sudden catching pain which lasts one or 2 seconds but it's not as severe as it used to be. This pain does not occur daily. He denies postprandial abdominal pain nausea or vomiting. He is status post right knee replacement about 4 weeks ago and he feels he is doing very well. He is having intermittent chills however his temperature is always normal. His weight is down by 2 pounds in the last 2 months.  Current Medications: Current Outpatient Prescriptions  Medication Sig Dispense Refill  . oxyCODONE-acetaminophen (PERCOCET/ROXICET) 5-325 MG per tablet at bedtime as needed for pain.       . pantoprazole (PROTONIX) 40 MG tablet Take 40 mg by mouth daily.      Marland Kitchen aspirin EC 325 MG EC tablet Take 1 tablet (325 mg total) by mouth 2 (two) times daily.  30 tablet  0   No current facility-administered medications for this visit.     Objective: Blood pressure 120/68, pulse 78, temperature 98.1 F (36.7 C), temperature source Oral, resp. rate 18, height 6\' 2"  (1.88 m), weight 235 lb 9.6 oz (106.867 kg). Patient is alert and in no acute distress. Conjunctiva is pink. Sclera is nonicteric Oropharyngeal mucosa is normal. No neck masses or thyromegaly noted. Cardiac exam with regular rhythm normal S1 and S2. No murmur or gallop noted. Lungs are clear to auscultation. Abdomen is flat soft and nontender without organomegaly or masses.  No LE edema or clubbing  noted.   Assessment:  History of choledocholithiasis. He apparently formed stone around metallic clip which was inside the bile duct at junction between CHD and CBD. All in all 3 clips came out. 2 were  in cystic duct remnant but they seemed to be connected. Intermittent chills most likely secondary to his recent knee replacement. His abdominal examination is normal.  Plan:  ERCP with stent removal and cholangioscopy in 4 weeks.

## 2012-04-17 NOTE — Patient Instructions (Signed)
ERCP to be scheduled in 4 weeks. Notify if you have right upper quadrant abdominal pain and fever greater than 101F.

## 2012-05-08 ENCOUNTER — Encounter (HOSPITAL_COMMUNITY): Payer: Self-pay | Admitting: Pharmacy Technician

## 2012-05-10 NOTE — Patient Instructions (Addendum)
[name]  05/10/2012   Your procedure is scheduled on:  05/18/2012  Report to [facility] at  615  AM.  Call this number if you have problems the morning of surgery: [phone]   Remember:   Do not eat food or drink liquids after midnight.   Take these medicines the morning of surgery with A SIP OF WATER:  none   Do not wear jewelry, make-up or nail polish.  Do not wear lotions, powders, or perfumes.   Do not shave 48 hours prior to surgery. Men may shave face and neck.  Do not bring valuables to the hospital.  Contacts, dentures or bridgework may not be worn into surgery.  Leave suitcase in the car. After surgery it may be brought to your room.  For patients admitted to the hospital, checkout time is 11:00 AM the day of discharge.   Patients discharged the day of surgery will not be allowed to drive  home.  Name and phone number of your driver: family  Special Instructions: N/A   Please read over the following fact sheets that you were given: Pain Booklet, Coughing and Deep Breathing, Surgical Site Infection Prevention, Anesthesia Post-op Instructions and Care and Recovery After Surgery Endoscopic Retrograde Cholangiopancreatography This is a test used to evaluate people with jaundice (a condition in which the person's skin is turning yellow because of the high level of bilirubin in your blood). Bilirubin is a product of the blood which is elevated when an obstruction in the bile duct occurs. The bile ducts are the pipe-like system which carry the bile from the liver to the gallbladder and out into your small bowel. In this test, a fiber optic endoscope (a small pencil-sized telescope) is inserted and a catheter is put into ducts for examination. This test can reveal stones, strictures, cysts, tumors, and other irregularities within the pancreatic ducts and bile ducts. PREPARATION FOR TEST Do not eat or drink after midnight the day before the test.  NORMAL FINDINGS Normal size of  biliary and pancreatic ducts. No obstruction or filling defects within the biliary or pancreatic ducts. Ranges for normal findings may vary among different laboratories and hospitals. You should always check with your doctor after having lab work or other tests done to discuss the meaning of your test results and whether your values are considered within normal limits. MEANING OF TEST  Your caregiver will go over the test results with you and discuss the importance and meaning of your results, as well as treatment options and the need for additional tests if necessary. OBTAINING THE TEST RESULTS  It is your responsibility to obtain your test results. Ask the lab or department performing the test when and how you will get your results. Document Released: 06/03/2004 Document Revised: 10/20/2010 Document Reviewed: 01/18/2008 [facility] Patient Information 2012 [place], [place].PATIENT INSTRUCTIONS POST-ANESTHESIA  IMMEDIATELY FOLLOWING SURGERY:  Do not drive or operate machinery for the first twenty four hours after surgery.  Do not make any important decisions for twenty four hours after surgery or while taking narcotic pain medications or sedatives.  If you develop intractable nausea and vomiting or a severe headache please notify your doctor immediately.  FOLLOW-UP:  Please make an appointment with your surgeon as instructed. You do not need to follow up with anesthesia unless specifically instructed to do so.  WOUND CARE INSTRUCTIONS (if applicable):  Keep a dry clean dressing on the anesthesia/puncture wound site if there is drainage.  Once  the wound has quit draining you may leave it open to air.  Generally you should leave the bandage intact for twenty four hours unless there is drainage.  If the epidural site drains for more than 36-48 hours please call the anesthesia department.  QUESTIONS?:  Please feel free to call your physician or the hospital operator if you have any questions, and they will  be happy to assist you.

## 2012-05-11 ENCOUNTER — Encounter (HOSPITAL_COMMUNITY)
Admission: RE | Admit: 2012-05-11 | Discharge: 2012-05-11 | Disposition: A | Discharge status: Home / Self Care | Payer: Medicare Other | Source: Ambulatory Visit | Attending: Internal Medicine | Admitting: Internal Medicine

## 2012-05-11 ENCOUNTER — Encounter (HOSPITAL_COMMUNITY): Payer: Self-pay

## 2012-05-11 LAB — BASIC METABOLIC PANEL
BUN: 16 mg/dL (ref 6–23)
CO2: 30 mEq/L (ref 19–32)
GFR calc non Af Amer: 64 mL/min — ABNORMAL LOW (ref >90)
Glucose, Bld: 112 mg/dL — ABNORMAL HIGH (ref 70–99)
Potassium: 4.4 mEq/L (ref 3.5–5.1)

## 2012-05-18 ENCOUNTER — Ambulatory Visit (HOSPITAL_COMMUNITY): Payer: Medicare Other

## 2012-05-18 ENCOUNTER — Encounter (HOSPITAL_COMMUNITY): Payer: Self-pay | Admitting: *Deleted

## 2012-05-18 ENCOUNTER — Encounter (HOSPITAL_COMMUNITY)
Admission: RE | Disposition: A | Discharge status: Home / Self Care | Payer: Self-pay | Source: Ambulatory Visit | Attending: Internal Medicine

## 2012-05-18 ENCOUNTER — Encounter (HOSPITAL_COMMUNITY): Payer: Self-pay | Admitting: Anesthesiology

## 2012-05-18 ENCOUNTER — Ambulatory Visit (HOSPITAL_COMMUNITY): Payer: Medicare Other | Admitting: Anesthesiology

## 2012-05-18 ENCOUNTER — Ambulatory Visit (HOSPITAL_COMMUNITY)
Admission: RE | Admit: 2012-05-18 | Discharge: 2012-05-18 | Disposition: A | Discharge status: Home / Self Care | Payer: Medicare Other | Source: Ambulatory Visit | Attending: Internal Medicine | Admitting: Internal Medicine

## 2012-05-18 DIAGNOSIS — K8050 Calculus of bile duct without cholangitis or cholecystitis without obstruction: Secondary | ICD-10-CM | POA: Insufficient documentation

## 2012-05-18 DIAGNOSIS — Z4689 Encounter for fitting and adjustment of other specified devices: Secondary | ICD-10-CM | POA: Insufficient documentation

## 2012-05-18 DIAGNOSIS — K838 Other specified diseases of biliary tract: Secondary | ICD-10-CM

## 2012-05-18 DIAGNOSIS — R109 Unspecified abdominal pain: Secondary | ICD-10-CM | POA: Insufficient documentation

## 2012-05-18 HISTORY — PX: ERCP: SHX5425

## 2012-05-18 HISTORY — PX: SPYGLASS CHOLANGIOSCOPY: SHX5441

## 2012-05-18 SURGERY — ERCP, WITH INTERVENTION IF INDICATED
Anesthesia: General

## 2012-05-18 MED ORDER — PHENYLEPHRINE HCL 10 MG/ML IJ SOLN
INTRAMUSCULAR | Status: DC | PRN
  Administered 2012-05-18 (×4): 50 ug via INTRAVENOUS

## 2012-05-18 MED ORDER — ACETYLCYSTEINE 20 % IN SOLN
4.0000 mL | Freq: Once | RESPIRATORY_TRACT | Status: DC
  Filled 2012-05-18: qty 4

## 2012-05-18 MED ORDER — FENTANYL CITRATE 0.05 MG/ML IJ SOLN: 25.0000 ug | INTRAMUSCULAR | Status: DC | PRN

## 2012-05-18 MED ORDER — MIDAZOLAM HCL 2 MG/2ML IJ SOLN
1.0000 mg | INTRAMUSCULAR | Status: DC | PRN
  Administered 2012-05-18: 2 mg via INTRAVENOUS

## 2012-05-18 MED ORDER — DEXAMETHASONE SODIUM PHOSPHATE 4 MG/ML IJ SOLN
INTRAMUSCULAR | Status: AC
  Filled 2012-05-18: qty 1

## 2012-05-18 MED ORDER — SUCCINYLCHOLINE CHLORIDE 20 MG/ML IJ SOLN
INTRAMUSCULAR | Status: DC | PRN
  Administered 2012-05-18: 120 mg via INTRAVENOUS

## 2012-05-18 MED ORDER — CEFAZOLIN SODIUM-DEXTROSE 2-3 GM-% IV SOLR
2.0000 g | INTRAVENOUS | Status: AC
  Administered 2012-05-18: 2 g via INTRAVENOUS

## 2012-05-18 MED ORDER — ONDANSETRON HCL 4 MG/2ML IJ SOLN
4.0000 mg | Freq: Once | INTRAMUSCULAR | Status: AC
  Administered 2012-05-18: 4 mg via INTRAVENOUS

## 2012-05-18 MED ORDER — STERILE WATER FOR IRRIGATION IR SOLN
Status: DC | PRN
  Administered 2012-05-18: 09:00:00

## 2012-05-18 MED ORDER — DEXAMETHASONE SODIUM PHOSPHATE 4 MG/ML IJ SOLN
4.0000 mg | Freq: Once | INTRAMUSCULAR | Status: AC
  Administered 2012-05-18: 4 mg via INTRAVENOUS

## 2012-05-18 MED ORDER — PROPOFOL 10 MG/ML IV EMUL
INTRAVENOUS | Status: AC
  Filled 2012-05-18: qty 20

## 2012-05-18 MED ORDER — FENTANYL CITRATE 0.05 MG/ML IJ SOLN
INTRAMUSCULAR | Status: DC | PRN
  Administered 2012-05-18 (×2): 50 ug via INTRAVENOUS

## 2012-05-18 MED ORDER — PROPOFOL 10 MG/ML IV BOLUS
INTRAVENOUS | Status: DC | PRN
  Administered 2012-05-18: 150 mg via INTRAVENOUS

## 2012-05-18 MED ORDER — SODIUM CHLORIDE 0.9 % IV SOLN
INTRAVENOUS | Status: DC | PRN
  Administered 2012-05-18: 09:00:00

## 2012-05-18 MED ORDER — SUCCINYLCHOLINE CHLORIDE 20 MG/ML IJ SOLN
INTRAMUSCULAR | Status: AC
  Filled 2012-05-18: qty 1

## 2012-05-18 MED ORDER — CEFAZOLIN SODIUM-DEXTROSE 2-3 GM-% IV SOLR
INTRAVENOUS | Status: AC
  Filled 2012-05-18: qty 50

## 2012-05-18 MED ORDER — MIDAZOLAM HCL 2 MG/2ML IJ SOLN
INTRAMUSCULAR | Status: AC
  Filled 2012-05-18: qty 2

## 2012-05-18 MED ORDER — GLYCOPYRROLATE 0.2 MG/ML IJ SOLN
INTRAMUSCULAR | Status: AC
  Filled 2012-05-18: qty 1

## 2012-05-18 MED ORDER — LIDOCAINE HCL (PF) 1 % IJ SOLN
INTRAMUSCULAR | Status: AC
  Filled 2012-05-18: qty 5

## 2012-05-18 MED ORDER — LACTATED RINGERS IV SOLN
INTRAVENOUS | Status: DC
  Administered 2012-05-18: 07:00:00 via INTRAVENOUS

## 2012-05-18 MED ORDER — GLYCOPYRROLATE 0.2 MG/ML IJ SOLN
0.2000 mg | Freq: Once | INTRAMUSCULAR | Status: AC
  Administered 2012-05-18: 0.2 mg via INTRAVENOUS

## 2012-05-18 MED ORDER — ROCURONIUM BROMIDE 50 MG/5ML IV SOLN
INTRAVENOUS | Status: AC
  Filled 2012-05-18: qty 1

## 2012-05-18 MED ORDER — ACETYLCYSTEINE 20 % IN SOLN
RESPIRATORY_TRACT | Status: AC
  Filled 2012-05-18: qty 4

## 2012-05-18 MED ORDER — LIDOCAINE HCL (CARDIAC) 20 MG/ML IV SOLN
INTRAVENOUS | Status: DC | PRN
  Administered 2012-05-18: 40 mg via INTRAVENOUS

## 2012-05-18 MED ORDER — ONDANSETRON HCL 4 MG/2ML IJ SOLN: 4.0000 mg | Freq: Once | INTRAMUSCULAR | Status: DC | PRN

## 2012-05-18 MED ORDER — ACETYLCYSTEINE 20 % IN SOLN
RESPIRATORY_TRACT | Status: DC | PRN
  Administered 2012-05-18: 4 mL via ORAL

## 2012-05-18 MED ORDER — ONDANSETRON HCL 4 MG/2ML IJ SOLN
INTRAMUSCULAR | Status: AC
  Filled 2012-05-18: qty 2

## 2012-05-18 MED ORDER — FENTANYL CITRATE 0.05 MG/ML IJ SOLN
INTRAMUSCULAR | Status: AC
  Filled 2012-05-18: qty 5

## 2012-05-18 MED ORDER — PHENYLEPHRINE HCL 10 MG/ML IJ SOLN
INTRAMUSCULAR | Status: AC
  Filled 2012-05-18: qty 1

## 2012-05-18 SURGICAL SUPPLY — 36 items
BAG HAMPER (MISCELLANEOUS) ×2 IMPLANT
BALLN RETRIEVAL 12X15 (BALLOONS) ×1 IMPLANT
BALN RTRVL 200 6-7FR 12-15 (BALLOONS) ×1
BASKET TRAPEZOID 3X6 (MISCELLANEOUS) IMPLANT
BASKET TRAPEZOID LITHO 2.0X5 (MISCELLANEOUS) ×1 IMPLANT
BSKT STON RTRVL TRAPEZOID 2X5 (MISCELLANEOUS) ×1
BSKT STON RTRVL TRAPEZOID 3X6 (MISCELLANEOUS)
CATH ACCESS DELIVERY (CATHETERS) ×2 IMPLANT
DEVICE INFLATION ENCORE 26 (MISCELLANEOUS) IMPLANT
DEVICE LOCKING W-BIOPSY CAP (MISCELLANEOUS) ×2 IMPLANT
FIBER LASER SLIMLINE GI 365 (MISCELLANEOUS) ×1 IMPLANT
FORCEPS BIOP RJ4 240 HOT (CUTTING FORCEPS) IMPLANT
FORCEPS BIOP SPYBITE 1.2X286 (FORCEP) IMPLANT
GUIDEWIRE HYDRA JAGWIRE .35 (WIRE) IMPLANT
GUIDEWIRE JAG HINI 025X260CM (WIRE) IMPLANT
KIT ROOM TURNOVER APOR (KITS) ×2 IMPLANT
LUBRICANT JELLY 4.5OZ STERILE (MISCELLANEOUS) ×2 IMPLANT
NDL HYPO 18GX1.5 BLUNT FILL (NEEDLE) ×1 IMPLANT
NEEDLE HYPO 18GX1.5 BLUNT FILL (NEEDLE) ×2 IMPLANT
NS IRRIG 500ML POUR BTL (IV SOLUTION) ×1 IMPLANT
PAD ARMBOARD 7.5X6 YLW CONV (MISCELLANEOUS) ×2 IMPLANT
PATHFINDER 450CM 0.18 (STENTS) IMPLANT
POSITIONER HEAD 8X9X4 ADT (SOFTGOODS) IMPLANT
PROBE VISUALIZATION SPYGLASS (PROBE) ×1 IMPLANT
SNARE ROTATE MED OVAL 20MM (MISCELLANEOUS) ×1 IMPLANT
SPHINCTEROTOME AUTOTOME .25 (MISCELLANEOUS) ×2 IMPLANT
SPHINCTEROTOME HYDRATOME 44 (MISCELLANEOUS) ×3 IMPLANT
SPONGE GAUZE 4X4 12PLY (GAUZE/BANDAGES/DRESSINGS) ×2 IMPLANT
SYR 3ML LL SCALE MARK (SYRINGE) ×2 IMPLANT
SYR 50ML LL SCALE MARK (SYRINGE) ×2 IMPLANT
SYSTEM CONTINUOUS INJECTION (MISCELLANEOUS) ×2 IMPLANT
TUBE IRRIGATION (IRRIGATION / IRRIGATOR) IMPLANT
TUBING ENDO SMARTCAP PENTAX (MISCELLANEOUS) ×2 IMPLANT
WALLSTENT METAL COVERED 10X60 (STENTS) IMPLANT
WALLSTENT METAL COVERED 10X80 (STENTS) IMPLANT
WATER STERILE IRR 1000ML POUR (IV SOLUTION) ×2 IMPLANT

## 2012-05-18 NOTE — H&P (Signed)
[name] is an 72 y.o. male.   Chief Complaint: Patient is here for ERCP with stent removal and Spyglass examination of bile duct. HPI: Patient is 71 year old Caucasian male who is having problems with abdominal pain since complicated illness with gangrenous gallbladder over 7 years ago. He presented in November 2013 with jaundice and cholangitis. Bile duct was stented with resolution of his acute symptoms. She returned for second ERCP on New Year's Eve and Spyglass examination revealed stone and metallic clips in the bile duct. 3 clips were removed. Stent was left in place as there was concern for injury to cystic duct remnant or bile duct. He has done well. He still has very fleeting right upper quadrant pain and occasional nausea without vomiting. He has very good appetite and has not lost any weight. He had right knee replacement about 8 weeks ago. He has had intermittent chills but no fever.  Past Medical History  Diagnosis Date  . Arthritis   . Numbness     left leg after back surgery  . Chronic back pain   . GERD (gastroesophageal reflux disease)   . Hx of adenomatous colonic polyps   . Schatzki's ring     History of  . Helicobacter pylori gastritis     Treated  . IBS (irritable bowel syndrome)   . Prostate ca     seed implants  . Complication of anesthesia     "hard to wake up after back surgery"    Past Surgical History  Procedure Laterality Date  . Appendectomy  age 27  . Back surgery  02/08    [facility]  . Back surgery  09/24/04    lumbar, [facility]  . Back surgery  09/18/02    [facility]  . Cholecystectomy  03/04/05    [facility], [facility]. Gangrenous cholecystitis complicated by abscess requiring percutaneous drainage. He also had common duct stones requiring ERCP and sphincterotomy.  . Ercp  03/02/05    [facility], [facility]. Normal-appearing biliary tree (gallbladder not image), status post sphincterotomy with recovery of small pieces of stone material, status post balloon occlusion cholangiogram.  .  Cataract extraction w/phaco  11/22/2010    Procedure: CATARACT EXTRACTION PHACO AND INTRAOCULAR LENS PLACEMENT (IOC);  Surgeon: Susa Simmonds;  Location: [facility];  Service: Ophthalmology;  Laterality: Right;  CDE: 6.81  . Esophagogastroduodenoscopy  August 2005    Erosive esophagitis and Schatzki ring, small hiatal hernia  . Colonoscopy  August 2005    Scattered sigmoid diverticulosis, splenic flexure polyp. Hyperplastic  . Colonoscopy  1997    3 cm tubular adenoma and a sending colon  . Incomplete colonoscopy  December 2009    Left-sided diverticula, mid descending colon polyp, due to recurrent looping and redundancy exam was incomplete. It was felt that the mid colon was reached. Followup barium enema showed colon interposition between the liver and the diaphragm, redundant sigmoid colon but no colon mass or polyp identified.. Pathology revealed tubular adenoma.  . Esophagogastroduodenoscopy  01/2008    Mild reflux esophagitis, small hiatal hernia  . Colonoscopy  02/01/2011    Rourk-friable anal canal, hyperplastic rectal polyp  . Esophagogastroduodenoscopy  02/01/2011    Rourk-erosive reflux esophagitis, small hiatal hernia  . Esophagogastroduodenoscopy  10/26/2011    Dr. Kerby Moors gastric submucosal petechia (bx-benign ulceration), juxta ampullary duodenal diverticulum and some mucosal edema involving 1st/2nd portion of duodenum with superficial erosions (bx-superficial ulceration/benign)  . Ercp  01/07/2012    Procedure: ENDOSCOPIC RETROGRADE CHOLANGIOPANCREATOGRAPHY (ERCP);  Surgeon: Joline Maxcy  Karilyn Cota, MD;  Location: [facility];  Service: Endoscopy;  Laterality: N/A;  possible biliary stenting  . Sphincterotomy  01/07/2012    Procedure: SPHINCTEROTOMY;  Surgeon: Malissa Hippo, MD;  Location: [facility];  Service: Endoscopy;  Laterality: N/A;  Extended  . Biliary stent placement  01/07/2012    Procedure: BILIARY STENT PLACEMENT;  Surgeon: Malissa Hippo, MD;  Location: [facility];  Service:  Endoscopy;  Laterality: N/A;  . Ercp  02/21/2012    Procedure: ENDOSCOPIC RETROGRADE CHOLANGIOPANCREATOGRAPHY (ERCP);  Surgeon: Malissa Hippo, MD;  Location: [facility];  Service: Endoscopy;  Laterality: N/A;  common bile duct stone and clip removed  . Spyglass cholangioscopy  02/21/2012    Procedure: SPYGLASS CHOLANGIOSCOPY;  Surgeon: Malissa Hippo, MD;  Location: [facility];  Service: Endoscopy;  Laterality: N/A;  . Biliary stent placement  02/21/2012    Procedure: BILIARY STENT PLACEMENT;  Surgeon: Malissa Hippo, MD;  Location: [facility];  Service: Endoscopy;  Laterality: N/A;  Biliary Stent Replacement  . Esophagogastroduodenoscopy  02/21/2012    Procedure: ESOPHAGOGASTRODUODENOSCOPY (EGD);  Surgeon: Malissa Hippo, MD;  Location: [facility];  Service: Endoscopy;  Laterality: N/A;  . Eye surgery    . Total knee arthroplasty  03/21/2012    Procedure: TOTAL KNEE ARTHROPLASTY;  Surgeon: [name], MD;  Location: [facility];  Service: Orthopedics;  Laterality: Right;  right knee arthroplasty    Family History  Problem Relation Age of Onset  . Hypotension Neg Hx   . Anesthesia problems Neg Hx   . Malignant hyperthermia Neg Hx   . Pseudochol deficiency Neg Hx   . Diabetes Mother   . Coronary artery disease Father   . Coronary artery disease Mother   . Diabetes Father   . Stomach cancer Father   . Colon cancer Neg Hx   . Deep vein thrombosis Daughter    Social History:  reports that he has never smoked. He has never used smokeless tobacco. He reports that he does not drink alcohol or use illicit drugs.  Allergies: No Known Allergies  No prescriptions prior to admission    No results found for this or any previous visit (from the past 48 hour(s)). No results found.  ROS  Blood pressure 165/90, pulse 72, temperature 97.8 F (36.6 C), temperature source Oral, resp. rate 20, height 6\' 2"  (1.88 m), weight 233 lb (105.688 kg), SpO2 95.00%. Physical Exam  Constitutional: He appears  well-developed and well-nourished.  HENT:  Mouth/Throat: Oropharynx is clear and moist.  Eyes: Conjunctivae are normal. No scleral icterus.  Neck: No thyromegaly present.  Cardiovascular: Normal rate, regular rhythm and normal heart sounds.   No murmur heard. Respiratory: Effort normal and breath sounds normal.  GI: He exhibits no distension and no mass. There is no tenderness.  Musculoskeletal: He exhibits no edema.  Lymphadenopathy:    He has no cervical adenopathy.  Neurological: He is alert.  Skin: Skin is warm and dry.     Assessment/Plan History of cholangitis secondary to stone in clips in the bile duct. Status post removal of stone and clips about 3 months ago. Patient will undergo ERCP with stent removal and Spyglass examination with intent to remove any residual stones. He will be given Ancef 2 g IV preoperatively. Patient is agreeable.  REHMAN,NAJEEB U 05/18/2012, 7:13 AM

## 2012-05-18 NOTE — Anesthesia Preprocedure Evaluation (Signed)
Anesthesia Evaluation  Patient identified by MRN, date of birth, ID band Patient awake    Reviewed: Allergy & Precautions, H&P , NPO status , Patient's Chart, lab work & pertinent test results, reviewed documented beta blocker date and time   History of Anesthesia Complications (+) PROLONGED EMERGENCE  Airway Mallampati: II TM Distance: >3 FB Neck ROM: Full    Dental  (+) Chipped,    Pulmonary  breath sounds clear to auscultation        Cardiovascular Rhythm:Regular     Neuro/Psych    GI/Hepatic GERD-  Controlled and Medicated,  Endo/Other    Renal/GU Prostate CA      Musculoskeletal  (+) Arthritis - (chronic LBP, L leg numbness),   Abdominal   Peds  Hematology   Anesthesia Other Findings   Reproductive/Obstetrics                           Anesthesia Physical Anesthesia Plan  ASA: II  Anesthesia Plan: General   Post-op Pain Management:    Induction: Intravenous, Rapid sequence and Cricoid pressure planned  Airway Management Planned: Oral ETT  Additional Equipment:   Intra-op Plan:   Post-operative Plan: Extubation in OR  Informed Consent: I have reviewed the patients History and Physical, chart, labs and discussed the procedure including the risks, benefits and alternatives for the proposed anesthesia with the patient or authorized representative who has indicated his/her understanding and acceptance.     Plan Discussed with:   Anesthesia Plan Comments:         Anesthesia Quick Evaluation

## 2012-05-18 NOTE — Brief Op Note (Signed)
05/18/2012  8:37 AM  PATIENT:  [name]  72 y.o. male  PRE-OPERATIVE DIAGNOSIS:  cbd stones  POST-OPERATIVE DIAGNOSIS:  cbd stones  PROCEDURE:  Procedure(s) with comments: ENDOSCOPIC RETROGRADE CHOLANGIOPANCREATOGRAPHY (ERCP) (N/A) - stone and debri removal SPYGLASS CHOLANGIOSCOPY (N/A) Biliary Stent Removal (N/A)  SURGEON:  Surgeon(s) and Role:    * Malissa Hippo, MD - Primary  ERCP findings. Biliary stent removed using polypectomy snare under fluoroscopic control. Spyglass examination revealed few small stones which were flushed out. Linear object ? Suture inside the bile duct. Another small stone removed using Dormia basket. Occlusion cholangiogram revealed no filling defects. Patient tolerated procedure well

## 2012-05-18 NOTE — Anesthesia Postprocedure Evaluation (Signed)
  Anesthesia Post-op Note  Patient: [name]  Procedure(s) Performed: Procedure(s) with comments: ENDOSCOPIC RETROGRADE CHOLANGIOPANCREATOGRAPHY (ERCP) (N/A) - stone and debri removal SPYGLASS CHOLANGIOSCOPY (N/A) Biliary Stent Removal (N/A)  Patient Location: PACU  Anesthesia Type:General  Level of Consciousness: awake, alert  and oriented  Airway and Oxygen Therapy: Patient Spontanous Breathing  Post-op Pain: none  Post-op Assessment: Post-op Vital signs reviewed, Patient's Cardiovascular Status Stable, Respiratory Function Stable, Patent Airway and No signs of Nausea or vomiting  Post-op Vital Signs: Reviewed and stable  Complications: No apparent anesthesia complications

## 2012-05-18 NOTE — Transfer of Care (Signed)
Immediate Anesthesia Transfer of Care Note  Patient: SHAYA ALTAMURA  Procedure(s) Performed: Procedure(s) with comments: ENDOSCOPIC RETROGRADE CHOLANGIOPANCREATOGRAPHY (ERCP) (N/A) - stone and debri removal SPYGLASS CHOLANGIOSCOPY (N/A) Biliary Stent Removal (N/A)  Patient Location: PACU  Anesthesia Type:General  Level of Consciousness: awake, alert  and oriented  Airway & Oxygen Therapy: Patient Spontanous Breathing and Patient connected to face mask oxygen  Post-op Assessment: Report given to PACU RN  Post vital signs: Reviewed and stable  Complications: No apparent anesthesia complications

## 2012-05-19 NOTE — Op Note (Signed)
[name], [name] NO.:  [id number]  MEDICAL RECORD NO.:  [id number]  LOCATION:  [facility]                          FACILITY:  [facility]  PHYSICIAN:  Lionel December, M.D.    DATE OF BIRTH:  1940/07/28  DATE OF PROCEDURE:  05/18/2012 DATE OF DISCHARGE:  05/18/2012                               PROCEDURE NOTE   PROCEDURE:  ERCP with stent removal, SpyGlass examination of bile duct, and stone extraction.  INDICATIONS:  The patient is a 72 year old Caucasian male with history of cholangitis.  He had stenting and his first ERCP in November 2013 when he presented with jaundice and acute cholangitis.  Second ERCP was 3 months ago, when he was found to have a stone and 3 metallic clips which were removed.  The stent was left in place.  He has done well except he still has fleeting right upper quadrant pain and occasional nausea.  He is undergoing ERCP with stent removal and reevaluation of the bile duct with SpyGlass.  Procedure risks were reviewed with the patient.  Informed consent was obtained.  MEDS FOR SEDATION/ANESTHESIA:  Please see anesthesia records for details.  FINDINGS:  Procedure performed in the OR.  After induction of general endotracheal anesthesia, the patient was placed in semiprone position. Time-out was carried out.  Therapeutic Pentax video duodenum scope was passed through oropharynx without any difficulty into esophagus and across the stomach and pylorus into bulb and descending duodenum.  The stent was in place.  It was caught with a polypectomy snare and removed under fluoroscopic control.  Duodenoscope was reintroduced into duodenum.  Ampulla was located inside a diverticulum on the right side. CBD was cannulated with Rx44 Autotome and 035 Hydra Jagwire. Cholangiogram was obtained.  CBD and CHD were not dilated.  There was no obvious filling defect.  Debris came out while contrast was injected. SpyGlass examination of bile duct was performed.   There were a few small floating stones and linear structure which possibly was suture.  Dormia basket was passed after SpyGlass was removed.  Two small stone fragments were removed.  Occlusion cholangiogram was obtained.  No filling defects were identified.  Endoscope was withdrawn.  The patient tolerated the procedure well and he is in the process of being extubated.  FINAL DIAGNOSES: 1. Biliary stent removed. 2. Two small stones were removed along with debris from the     bile duct using Dormia basket. 3. Linear structure in the CBD possibly suture. This structure could not be removed. Dormia basket or balloon stone extractor.  RECOMMENDATIONS:  The patient will resume usual medications.  He will return for office visit in 8 weeks.          ______________________________ Lionel December, M.D.     NR/MEDQ  D:  05/18/2012  T:  05/19/2012  Job:  [id number]

## 2012-05-21 ENCOUNTER — Encounter (HOSPITAL_COMMUNITY): Payer: Self-pay | Admitting: Internal Medicine

## 2012-07-23 ENCOUNTER — Ambulatory Visit (INDEPENDENT_AMBULATORY_CARE_PROVIDER_SITE_OTHER): Payer: Medicare Other | Admitting: Internal Medicine

## 2012-07-23 ENCOUNTER — Encounter (INDEPENDENT_AMBULATORY_CARE_PROVIDER_SITE_OTHER): Payer: Self-pay | Admitting: Internal Medicine

## 2012-07-23 VITALS — BP 118/76 | HR 78 | Temp 97.4°F | Resp 20 | Ht 74.0 in | Wt 243.6 lb

## 2012-07-23 DIAGNOSIS — K8050 Calculus of bile duct without cholangitis or cholecystitis without obstruction: Secondary | ICD-10-CM

## 2012-07-23 DIAGNOSIS — K831 Obstruction of bile duct: Secondary | ICD-10-CM

## 2012-07-23 NOTE — Patient Instructions (Signed)
Can take Zantac OTC 150 mg or Pepcid OTC 20 mg up to twice a day as needed for heartburn. Physician will contact you with results of blood work when completed

## 2012-07-23 NOTE — Progress Notes (Signed)
Presenting complaint;  History of choledocholithiasis in CBD stricture.  Subjective:  Patient is 72 year old Caucasian male who is here for scheduled visit. He presented back in November 2013 with acute cholangitis. He was decompressed with 3 stents. He returned and December 2013 multiple clips were removed from his bile duct. Since he had narrowing at the junction of CHD and CBD stent was left in place. Stent was removed from 05/18/2012 along with 2 small stones. He had linear structure in the bile duct which could not be removed and felt to be suture. He feels well. He is having one to 2 episodes of fleeting transient mid epigastric pain which is not associated with nausea vomiting fever or chills. He has good appetite. He has gained 8 pounds since his last visit. He has occasional heartburn with certain foods relieved with OTC NSAIDs.  Current Medications: Rolaids by mouth daily when necessary.  Objective: Blood pressure 118/76, pulse 78, temperature 97.4 F (36.3 C), temperature source Oral, resp. rate 20, height 6\' 2"  (1.88 m), weight 243 lb 9.6 oz (110.496 kg). Patient is alert and in no acute distress Conjunctiva is pink. Sclera is nonicteric Oropharyngeal mucosa is normal. No neck masses or thyromegaly noted. Abdomen is soft and nontender without organomegaly or masses. No LE edema or clubbing noted.   Assessment:  #1. History of choledocholithiasis and common bile duct stricture. Stones formed around clips in the bile. All of these were removed in December 2013. Last ERCP was about 2 months ago with removal of stent into small stones.he has what appears to be suture in CBD. He is having sporadic fleeting epigastric pain he has no other symptoms suggest biliary colic. If this pain occurs more frequently will put him back on PPI.    Plan:  Patient will go to the lab for LFTs. He will keep symptom diary as to frequency of epigastric pain and call if it occurs more than twice a  month. Office visit in one year.

## 2012-08-01 LAB — HEPATIC FUNCTION PANEL
Bilirubin, Direct: 0.2 mg/dL (ref 0.0–0.3)
Indirect Bilirubin: 0.7 mg/dL (ref 0.0–0.9)

## 2012-08-10 ENCOUNTER — Ambulatory Visit (INDEPENDENT_AMBULATORY_CARE_PROVIDER_SITE_OTHER): Payer: Medicare Other | Admitting: Urology

## 2012-08-10 DIAGNOSIS — Z8546 Personal history of malignant neoplasm of prostate: Secondary | ICD-10-CM

## 2012-08-10 DIAGNOSIS — N529 Male erectile dysfunction, unspecified: Secondary | ICD-10-CM

## 2012-09-26 ENCOUNTER — Other Ambulatory Visit: Payer: Self-pay

## 2012-10-11 ENCOUNTER — Other Ambulatory Visit (INDEPENDENT_AMBULATORY_CARE_PROVIDER_SITE_OTHER): Payer: Self-pay | Admitting: Internal Medicine

## 2012-10-11 ENCOUNTER — Telehealth (INDEPENDENT_AMBULATORY_CARE_PROVIDER_SITE_OTHER): Payer: Self-pay | Admitting: *Deleted

## 2012-10-11 DIAGNOSIS — K219 Gastro-esophageal reflux disease without esophagitis: Secondary | ICD-10-CM

## 2012-10-11 MED ORDER — OMEPRAZOLE 40 MG PO CPDR: 40.0000 mg | DELAYED_RELEASE_CAPSULE | Freq: Every day | ORAL | Status: DC

## 2012-10-11 NOTE — Telephone Encounter (Signed)
Terri - Would you please address this for Mr. Freida Busman , he uses [facility].

## 2012-10-11 NOTE — Telephone Encounter (Signed)
Rx for omeprazole eprescribed to pharmacy

## 2012-10-11 NOTE — Telephone Encounter (Signed)
Gloyd turned down medication for Acid Reflux/indegestion last time he seen Dr. Karilyn Cota. He is needing something now if Dr. Karilyn Cota would please call him something in to [facility]. He can not afford really expensive medications, so generic would work best. The return phone number is [phone].

## 2012-10-23 ENCOUNTER — Other Ambulatory Visit (HOSPITAL_COMMUNITY): Payer: Self-pay | Admitting: Family Medicine

## 2012-10-23 ENCOUNTER — Ambulatory Visit (HOSPITAL_COMMUNITY)
Admission: RE | Admit: 2012-10-23 | Discharge: 2012-10-23 | Disposition: A | Discharge status: Home / Self Care | Payer: Medicare Other | Source: Ambulatory Visit | Attending: Family Medicine | Admitting: Family Medicine

## 2012-10-23 DIAGNOSIS — R05 Cough: Secondary | ICD-10-CM

## 2012-10-23 DIAGNOSIS — Z7722 Contact with and (suspected) exposure to environmental tobacco smoke (acute) (chronic): Secondary | ICD-10-CM

## 2012-10-23 DIAGNOSIS — R059 Cough, unspecified: Secondary | ICD-10-CM | POA: Insufficient documentation

## 2012-12-20 ENCOUNTER — Ambulatory Visit (INDEPENDENT_AMBULATORY_CARE_PROVIDER_SITE_OTHER): Payer: Medicare Other | Admitting: Otolaryngology

## 2012-12-20 DIAGNOSIS — H903 Sensorineural hearing loss, bilateral: Secondary | ICD-10-CM

## 2012-12-20 DIAGNOSIS — H6980 Other specified disorders of Eustachian tube, unspecified ear: Secondary | ICD-10-CM

## 2012-12-27 ENCOUNTER — Other Ambulatory Visit: Payer: Self-pay

## 2013-02-08 ENCOUNTER — Ambulatory Visit (INDEPENDENT_AMBULATORY_CARE_PROVIDER_SITE_OTHER): Payer: Medicare Other | Admitting: Urology

## 2013-02-08 DIAGNOSIS — Z8546 Personal history of malignant neoplasm of prostate: Secondary | ICD-10-CM

## 2013-02-08 DIAGNOSIS — N509 Disorder of male genital organs, unspecified: Secondary | ICD-10-CM

## 2013-02-08 DIAGNOSIS — N529 Male erectile dysfunction, unspecified: Secondary | ICD-10-CM

## 2013-05-15 ENCOUNTER — Encounter (INDEPENDENT_AMBULATORY_CARE_PROVIDER_SITE_OTHER): Payer: Self-pay | Admitting: *Deleted

## 2013-07-23 ENCOUNTER — Other Ambulatory Visit (INDEPENDENT_AMBULATORY_CARE_PROVIDER_SITE_OTHER): Payer: Self-pay | Admitting: *Deleted

## 2013-07-23 ENCOUNTER — Ambulatory Visit (INDEPENDENT_AMBULATORY_CARE_PROVIDER_SITE_OTHER): Payer: Medicare Other | Admitting: Internal Medicine

## 2013-07-23 ENCOUNTER — Other Ambulatory Visit (INDEPENDENT_AMBULATORY_CARE_PROVIDER_SITE_OTHER): Payer: Self-pay | Admitting: Internal Medicine

## 2013-07-23 ENCOUNTER — Telehealth (INDEPENDENT_AMBULATORY_CARE_PROVIDER_SITE_OTHER): Payer: Self-pay | Admitting: *Deleted

## 2013-07-23 ENCOUNTER — Encounter (INDEPENDENT_AMBULATORY_CARE_PROVIDER_SITE_OTHER): Payer: Self-pay | Admitting: Internal Medicine

## 2013-07-23 VITALS — BP 126/58 | HR 76 | Temp 97.9°F | Ht 72.0 in | Wt 248.8 lb

## 2013-07-23 DIAGNOSIS — K831 Obstruction of bile duct: Secondary | ICD-10-CM

## 2013-07-23 DIAGNOSIS — K8309 Other cholangitis: Secondary | ICD-10-CM

## 2013-07-23 DIAGNOSIS — Z860100 Personal history of colon polyps, unspecified: Secondary | ICD-10-CM | POA: Insufficient documentation

## 2013-07-23 DIAGNOSIS — Z1211 Encounter for screening for malignant neoplasm of colon: Secondary | ICD-10-CM

## 2013-07-23 LAB — COMPREHENSIVE METABOLIC PANEL
ALK PHOS: 65 U/L (ref 39–117)
ALT: 30 U/L (ref 0–53)
AST: 30 U/L (ref 0–37)
Albumin: 3.9 g/dL (ref 3.5–5.2)
BILIRUBIN TOTAL: 0.6 mg/dL (ref 0.2–1.2)
BUN: 17 mg/dL (ref 6–23)
CO2: 33 meq/L — AB (ref 19–32)
CREATININE: 1.34 mg/dL (ref 0.50–1.35)
Calcium: 9.6 mg/dL (ref 8.4–10.5)
Chloride: 98 mEq/L (ref 96–112)
Glucose, Bld: 97 mg/dL (ref 70–99)
Potassium: 4.8 mEq/L (ref 3.5–5.3)
Sodium: 139 mEq/L (ref 135–145)
Total Protein: 7 g/dL (ref 6.0–8.3)

## 2013-07-23 NOTE — Patient Instructions (Signed)
OV in 1 yr. CMET today

## 2013-07-23 NOTE — Telephone Encounter (Signed)
Patient needs trilyte 

## 2013-07-23 NOTE — Progress Notes (Addendum)
Subjective:     Patient ID: [name], male   DOB: 1940-08-28, 73 y.o.   MRN: [id number]  HPI Here today for f/u. Last seen in June of 2014. In November of 2013 he presented with acute cholangitis. He was decompressed by Dr Laural Golden with 3 stents. In December of 2013 multiple clips were removed from his bile duct.  Stent removed in March of 2014 with 2 small stones.  He tells me he feels okay. Appetite is good. No weight loss. Usually has a BM about once a day. No melena or bright red rectal bleeding Hx of prostate cancer diagnosed in 1990. Occasionally has mid abdominal discomfort.   03/03/2008 DG Colon with air Hi Density air:  (incomplete colonoscopy) IMPRESSION:  Scattered colonic diverticulosis.  Colon interposition between liver and diaphragm.  Redundant sigmoid colon.  No definite colonic mass or polyp identified.  01/29/2008 Colonoscopy: Dr. Gala Romney COLONOSCOPY FINDINGS: Normal rectum. Incomplete exam. Left-sided  diverticula and mid descending colon polyp, status post cold biopsy  removal. Proximal colon/cecum.  Biopsy MICROSCOPIC EXAMINATION AND DIAGNOSIS  COLON, DESCENDING, POLYP, BIOPSY: TUBULAR ADENOMA. NO HIGH GRADE DYSPLASIA OR MALIGNANCY IDENTIFIED. (ONE FRAGMENT)    10/14/2003 Colonoscopy: Colonoscopy findings: Normal rectum with scattered sigmoid diverticula,  polyp at the splenic flexure cold snared, remaining colonic mucosa appeared  normal.     05/18/2013 ERCP: FINAL DIAGNOSES:  1. Biliary stent removed.  2. Two small stones were removed along with debris from the  bile duct using Dormia basket.  3. Linear structure in the CBD possibly suture. This structure could not be removed. Dormia basket or balloon stone extractor.  01/06/2012 ERCP 1. High-grade stricture at junction of common hepatic and common bile  duct located in close proximity to a metallic clip.  2. Large periampullary diverticulum.  3. 10 Fr/12 cm plastic stent placed for biliary  decompression.  4. Suspect we are dealing with chronic benign stricture.  02/21/2012 ERCP: FINAL DIAGNOSES: Mild changes of reflux esophagitis, otherwise normal  EGD. Gastric antral ulcer has completely healed.  Periampullary diverticulum.  Biliary stent was removed.  SpyGlass examination revealed stone and clips in the bile duct. Three  clips were removed along with a pigment stone.  10-French 9-cm biliary stent.     CMP     Component Value Date/Time   NA 136 05/11/2012 1156   K 4.4 05/11/2012 1156   CL 97 05/11/2012 1156   CO2 30 05/11/2012 1156   GLUCOSE 112* 05/11/2012 1156   BUN 16 05/11/2012 1156   CREATININE 1.12 05/11/2012 1156   CALCIUM 9.9 05/11/2012 1156   PROT 7.1 08/01/2012 1125   ALBUMIN 3.8 08/01/2012 1125   AST 22 08/01/2012 1125   ALT 18 08/01/2012 1125   ALKPHOS 77 08/01/2012 1125   BILITOT 0.9 08/01/2012 1125   GFRNONAA 64* 05/11/2012 1156   GFRAA 74* 05/11/2012 1156         Review of Systems Past Medical History  Diagnosis Date  . Arthritis   . Numbness     left leg after back surgery  . Chronic back pain   . GERD (gastroesophageal reflux disease)   . Hx of adenomatous colonic polyps   . Schatzki's ring     History of  . Helicobacter pylori gastritis     Treated  . IBS (irritable bowel syndrome)   . Prostate ca     seed implants  . Complication of anesthesia     "hard to wake up after back surgery"  Past Surgical History  Procedure Laterality Date  . Appendectomy  age 69  . Back surgery  02/08    [place]  . Back surgery  09/24/04    lumbar, [facility]  . Back surgery  09/18/02    [place]  . Cholecystectomy  03/04/05    [facility], [place]. Gangrenous cholecystitis complicated by abscess requiring percutaneous drainage. He also had common duct stones requiring ERCP and sphincterotomy.  . Ercp  03/02/05    [facility], [facility]. Normal-appearing biliary tree (gallbladder not image), status post sphincterotomy with recovery of small pieces of stone material, status post  balloon occlusion cholangiogram.  . Cataract extraction w/phaco  11/22/2010    Procedure: CATARACT EXTRACTION PHACO AND INTRAOCULAR LENS PLACEMENT ([place]);  Surgeon: Williams Che;  Location: [facility];  Service: Ophthalmology;  Laterality: Right;  CDE: 6.81  . Esophagogastroduodenoscopy  August 2005    Erosive esophagitis and Schatzki ring, small hiatal hernia  . Colonoscopy  August 2005    Scattered sigmoid diverticulosis, splenic flexure polyp. Hyperplastic  . Colonoscopy  1997    3 cm tubular adenoma and a sending colon  . Incomplete colonoscopy  December 2009    Left-sided diverticula, mid descending colon polyp, due to recurrent looping and redundancy exam was incomplete. It was felt that the mid colon was reached. Followup barium enema showed colon interposition between the liver and the diaphragm, redundant sigmoid colon but no colon mass or polyp identified.. Pathology revealed tubular adenoma.  . Esophagogastroduodenoscopy  01/2008    Mild reflux esophagitis, small hiatal hernia  . Colonoscopy  02/01/2011    [facility]-friable anal canal, hyperplastic rectal polyp  . Esophagogastroduodenoscopy  02/01/2011    [facility]-erosive reflux esophagitis, small hiatal hernia  . Esophagogastroduodenoscopy  10/26/2011    Dr. Eli Phillips gastric submucosal petechia (bx-benign ulceration), juxta ampullary duodenal diverticulum and some mucosal edema involving 1st/2nd portion of duodenum with superficial erosions (bx-superficial ulceration/benign)  . Ercp  01/07/2012    Procedure: ENDOSCOPIC RETROGRADE CHOLANGIOPANCREATOGRAPHY (ERCP);  Surgeon: Rogene Houston, MD;  Location: [facility];  Service: Endoscopy;  Laterality: N/A;  possible biliary stenting  . Sphincterotomy  01/07/2012    Procedure: SPHINCTEROTOMY;  Surgeon: Rogene Houston, MD;  Location: [facility];  Service: Endoscopy;  Laterality: N/A;  Extended  . Biliary stent placement  01/07/2012    Procedure: BILIARY STENT PLACEMENT;  Surgeon: Rogene Houston,  MD;  Location: [facility];  Service: Endoscopy;  Laterality: N/A;  . Ercp  02/21/2012    Procedure: ENDOSCOPIC RETROGRADE CHOLANGIOPANCREATOGRAPHY (ERCP);  Surgeon: Rogene Houston, MD;  Location: [facility];  Service: Endoscopy;  Laterality: N/A;  common bile duct stone and clip removed  . Spyglass cholangioscopy  02/21/2012    Procedure: SPYGLASS CHOLANGIOSCOPY;  Surgeon: Rogene Houston, MD;  Location: [facility];  Service: Endoscopy;  Laterality: N/A;  . Biliary stent placement  02/21/2012    Procedure: BILIARY STENT PLACEMENT;  Surgeon: Rogene Houston, MD;  Location: [facility];  Service: Endoscopy;  Laterality: N/A;  Biliary Stent Replacement  . Esophagogastroduodenoscopy  02/21/2012    Procedure: ESOPHAGOGASTRODUODENOSCOPY (EGD);  Surgeon: Rogene Houston, MD;  Location: [facility];  Service: Endoscopy;  Laterality: N/A;  . Eye surgery    . Total knee arthroplasty  03/21/2012    Procedure: TOTAL KNEE ARTHROPLASTY;  Surgeon: [name], MD;  Location: [place];  Service: Orthopedics;  Laterality: Right;  right knee arthroplasty  . Ercp N/A 05/18/2012    Procedure: ENDOSCOPIC RETROGRADE CHOLANGIOPANCREATOGRAPHY (ERCP);  Surgeon: Bernadene Person  Gloriann Loan, MD;  Location: [facility];  Service: Endoscopy;  Laterality: N/A;  stone and debri removal  . Spyglass cholangioscopy N/A 05/18/2012    Procedure: SPYGLASS CHOLANGIOSCOPY;  Surgeon: Rogene Houston, MD;  Location: [facility];  Service: Endoscopy;  Laterality: N/A;    No Known Allergies  No current outpatient prescriptions on file prior to visit.   No current facility-administered medications on file prior to visit.        Objective:   Physical Exam Filed Vitals:   07/23/13 1037  BP: 126/58  Pulse: 76  Temp: 97.9 F (36.6 C)  Height: 6' (1.829 m)  Weight: 248 lb 12.8 oz (112.855 kg)   Alert and oriented. Skin warm and dry. Oral mucosa is moist.   . Sclera anicteric, conjunctivae is pink. Thyroid not enlarged. No cervical lymphadenopathy. Lungs clear. Heart  regular rate and rhythm.  Abdomen is soft. Bowel sounds are positive. No hepatomegaly. No abdominal masses felt. No tenderness.  No edema to lower extremities. Patient is alert and oriented.    Assessment:     History of choledocholithiasis and common bile duct stricture. Stones formed around clips in the bile. All of these were removed in December 2013.  CBD Surveillance colonoscopy for hx of colon polyps. Last colonoscopy in 2009.       Plan:    cmet. Colonoscopy. OV in 1 yr.   Surveillance colonoscopy

## 2013-07-25 MED ORDER — PEG 3350-KCL-NA BICARB-NACL 420 G PO SOLR: 4000.0000 mL | Freq: Once | ORAL | Status: DC

## 2013-08-07 ENCOUNTER — Encounter (HOSPITAL_COMMUNITY): Payer: Self-pay | Admitting: Pharmacy Technician

## 2013-08-15 ENCOUNTER — Ambulatory Visit (HOSPITAL_COMMUNITY): Payer: Medicare Other

## 2013-08-15 ENCOUNTER — Ambulatory Visit (HOSPITAL_COMMUNITY)
Admission: RE | Admit: 2013-08-15 | Discharge: 2013-08-15 | Disposition: A | Discharge status: Home / Self Care | Payer: Medicare Other | Source: Ambulatory Visit | Attending: Internal Medicine | Admitting: Internal Medicine

## 2013-08-15 ENCOUNTER — Ambulatory Visit: Admit: 2013-08-15 | Payer: Self-pay | Admitting: Internal Medicine

## 2013-08-15 ENCOUNTER — Encounter (HOSPITAL_COMMUNITY): Payer: Self-pay | Admitting: *Deleted

## 2013-08-15 ENCOUNTER — Encounter (HOSPITAL_COMMUNITY)
Admission: RE | Disposition: A | Discharge status: Home / Self Care | Payer: Self-pay | Source: Ambulatory Visit | Attending: Internal Medicine

## 2013-08-15 DIAGNOSIS — Z79899 Other long term (current) drug therapy: Secondary | ICD-10-CM | POA: Diagnosis not present

## 2013-08-15 DIAGNOSIS — Z860100 Personal history of colon polyps, unspecified: Secondary | ICD-10-CM | POA: Insufficient documentation

## 2013-08-15 DIAGNOSIS — K668 Other specified disorders of peritoneum: Secondary | ICD-10-CM

## 2013-08-15 DIAGNOSIS — K219 Gastro-esophageal reflux disease without esophagitis: Secondary | ICD-10-CM | POA: Insufficient documentation

## 2013-08-15 DIAGNOSIS — Z09 Encounter for follow-up examination after completed treatment for conditions other than malignant neoplasm: Secondary | ICD-10-CM | POA: Diagnosis not present

## 2013-08-15 DIAGNOSIS — K5730 Diverticulosis of large intestine without perforation or abscess without bleeding: Secondary | ICD-10-CM | POA: Diagnosis not present

## 2013-08-15 DIAGNOSIS — Z538 Procedure and treatment not carried out for other reasons: Secondary | ICD-10-CM

## 2013-08-15 HISTORY — PX: COLONOSCOPY: SHX5424

## 2013-08-15 SURGERY — COLONOSCOPY
Anesthesia: Moderate Sedation

## 2013-08-15 MED ORDER — SODIUM CHLORIDE 0.9 % IV SOLN
INTRAVENOUS | Status: DC
Start: 2013-08-15 — End: 2013-08-15
  Administered 2013-08-15: 12:00:00 via INTRAVENOUS

## 2013-08-15 MED ORDER — MEPERIDINE HCL 50 MG/ML IJ SOLN
INTRAMUSCULAR | Status: AC
  Filled 2013-08-15: qty 1

## 2013-08-15 MED ORDER — MIDAZOLAM HCL 5 MG/5ML IJ SOLN
INTRAMUSCULAR | Status: AC
  Filled 2013-08-15: qty 10

## 2013-08-15 MED ORDER — CEFAZOLIN SODIUM-DEXTROSE 2-3 GM-% IV SOLR
INTRAVENOUS | Status: AC
  Filled 2013-08-15: qty 50

## 2013-08-15 MED ORDER — DEXTROSE 5 % IV SOLN: 2.0000 g | Freq: Once | INTRAVENOUS | Status: DC

## 2013-08-15 MED ORDER — MEPERIDINE HCL 50 MG/ML IJ SOLN
INTRAMUSCULAR | Status: DC | PRN
  Administered 2013-08-15 (×2): 25 mg via INTRAVENOUS

## 2013-08-15 MED ORDER — MIDAZOLAM HCL 5 MG/5ML IJ SOLN
INTRAMUSCULAR | Status: DC | PRN
  Administered 2013-08-15 (×3): 2 mg via INTRAVENOUS

## 2013-08-15 MED ORDER — CEFAZOLIN SODIUM-DEXTROSE 2-3 GM-% IV SOLR
2.0000 g | Freq: Once | INTRAVENOUS | Status: AC
  Administered 2013-08-15: 2 g via INTRAVENOUS

## 2013-08-15 NOTE — H&P (Signed)
[name] is an 73 y.o. male.   Chief Complaint: Patient is here for colonoscopy. HPI: Patient is 73 year old Caucasian male with history of colonic adenomas and is here for surveillance colonoscopy. His last exam was 5 years ago and was incomplete because of redundant colon was followed by a barium enema. He denies abdominal pain change in his bowel habits or rectal bleeding. Family history is negative for CRC.  Past Medical History  Diagnosis Date  . Arthritis   . Numbness     left leg after back surgery  . Chronic back pain   . GERD (gastroesophageal reflux disease)   . Hx of adenomatous colonic polyps   . Schatzki's ring     History of  . Helicobacter pylori gastritis     Treated  . IBS (irritable bowel syndrome)   . Prostate ca     seed implants  . Complication of anesthesia     "hard to wake up after back surgery"    Past Surgical History  Procedure Laterality Date  . Appendectomy  age 4  . Back surgery  02/08    [place]  . Back surgery  09/24/04    [facility], [facility]  . Back surgery  09/18/02    [place]  . Cholecystectomy  03/04/05    [facility], [facility]. Gangrenous cholecystitis complicated by abscess requiring percutaneous drainage. He also had common duct stones requiring ERCP and sphincterotomy.  . Ercp  03/02/05    [facility], [facility]. Normal-appearing biliary tree (gallbladder not image), status post sphincterotomy with recovery of small pieces of stone material, status post balloon occlusion cholangiogram.  . Cataract extraction w/phaco  11/22/2010    Procedure: CATARACT EXTRACTION PHACO AND INTRAOCULAR LENS PLACEMENT ([place]);  Surgeon: Williams Che;  Location: [facility];  Service: Ophthalmology;  Laterality: Right;  CDE: 6.81  . Esophagogastroduodenoscopy  August 2005    Erosive esophagitis and Schatzki ring, small hiatal hernia  . Colonoscopy  August 2005    Scattered sigmoid diverticulosis, splenic flexure polyp. Hyperplastic  . Colonoscopy  1997    3 cm tubular adenoma and a sending  colon  . Incomplete colonoscopy  December 2009    Left-sided diverticula, mid descending colon polyp, due to recurrent looping and redundancy exam was incomplete. It was felt that the mid colon was reached. Followup barium enema showed colon interposition between the liver and the diaphragm, redundant sigmoid colon but no colon mass or polyp identified.. Pathology revealed tubular adenoma.  . Esophagogastroduodenoscopy  01/2008    Mild reflux esophagitis, small hiatal hernia  . Colonoscopy  02/01/2011    [facility]-friable anal canal, hyperplastic rectal polyp  . Esophagogastroduodenoscopy  02/01/2011    [facility]-erosive reflux esophagitis, small hiatal hernia  . Esophagogastroduodenoscopy  10/26/2011    Dr. Eli Phillips gastric submucosal petechia (bx-benign ulceration), juxta ampullary duodenal diverticulum and some mucosal edema involving 1st/2nd portion of duodenum with superficial erosions (bx-superficial ulceration/benign)  . Ercp  01/07/2012    Procedure: ENDOSCOPIC RETROGRADE CHOLANGIOPANCREATOGRAPHY (ERCP);  Surgeon: Rogene Houston, MD;  Location: [facility];  Service: Endoscopy;  Laterality: N/A;  possible biliary stenting  . Sphincterotomy  01/07/2012    Procedure: SPHINCTEROTOMY;  Surgeon: Rogene Houston, MD;  Location: [facility];  Service: Endoscopy;  Laterality: N/A;  Extended  . Biliary stent placement  01/07/2012    Procedure: BILIARY STENT PLACEMENT;  Surgeon: Rogene Houston, MD;  Location: [facility];  Service: Endoscopy;  Laterality: N/A;  . Ercp  02/21/2012    Procedure: ENDOSCOPIC RETROGRADE  CHOLANGIOPANCREATOGRAPHY (ERCP);  Surgeon: Rogene Houston, MD;  Location: [facility];  Service: Endoscopy;  Laterality: N/A;  common bile duct stone and clip removed  . Spyglass cholangioscopy  02/21/2012    Procedure: SPYGLASS CHOLANGIOSCOPY;  Surgeon: Rogene Houston, MD;  Location: [facility];  Service: Endoscopy;  Laterality: N/A;  . Biliary stent placement  02/21/2012    Procedure: BILIARY STENT  PLACEMENT;  Surgeon: Rogene Houston, MD;  Location: [facility];  Service: Endoscopy;  Laterality: N/A;  Biliary Stent Replacement  . Esophagogastroduodenoscopy  02/21/2012    Procedure: ESOPHAGOGASTRODUODENOSCOPY (EGD);  Surgeon: Rogene Houston, MD;  Location: [facility];  Service: Endoscopy;  Laterality: N/A;  . Eye surgery    . Total knee arthroplasty  03/21/2012    Procedure: TOTAL KNEE ARTHROPLASTY;  Surgeon: [name], MD;  Location: [facility];  Service: Orthopedics;  Laterality: Right;  right knee arthroplasty  . Ercp N/A 05/18/2012    Procedure: ENDOSCOPIC RETROGRADE CHOLANGIOPANCREATOGRAPHY (ERCP);  Surgeon: Rogene Houston, MD;  Location: [facility];  Service: Endoscopy;  Laterality: N/A;  stone and debri removal  . Spyglass cholangioscopy N/A 05/18/2012    Procedure: SPYGLASS CHOLANGIOSCOPY;  Surgeon: Rogene Houston, MD;  Location: [facility];  Service: Endoscopy;  Laterality: N/A;    Family History  Problem Relation Age of Onset  . Hypotension Neg Hx   . Anesthesia problems Neg Hx   . Malignant hyperthermia Neg Hx   . Pseudochol deficiency Neg Hx   . Diabetes Mother   . Coronary artery disease Father   . Coronary artery disease Mother   . Diabetes Father   . Stomach cancer Father   . Colon cancer Neg Hx   . Deep vein thrombosis Daughter    Social History:  reports that he has never smoked. He has never used smokeless tobacco. He reports that he does not drink alcohol or use illicit drugs.  Allergies: No Known Allergies  Medications Prior to Admission  Medication Sig Dispense Refill  . omeprazole (PRILOSEC) 40 MG capsule Take 40 mg by mouth as needed.        No results found for this or any previous visit (from the past 48 hour(s)). No results found.  ROS  Blood pressure 156/85, pulse 78, temperature 97.9 F (36.6 C), temperature source Oral, resp. rate 18, height 6' (1.829 m), weight 240 lb (108.863 kg), SpO2 92.00%. Physical Exam  Constitutional: He appears well-developed and  well-nourished.  HENT:  Mouth/Throat: Oropharynx is clear and moist.  Eyes: No scleral icterus.  Neck: No thyromegaly present.  Cardiovascular: Normal rate and regular rhythm.   No murmur heard. Respiratory: Effort normal and breath sounds normal.  GI: Soft. He exhibits no distension and no mass. There is no tenderness.  Musculoskeletal: He exhibits no edema.  Lymphadenopathy:    He has no cervical adenopathy.  Neurological: He is alert.  Skin: Skin is warm and dry.     Assessment/Plan History of colonic adenomas. Surveillance colonoscopy.  REHMAN,NAJEEB U 08/15/2013, 1:33 PM

## 2013-08-15 NOTE — Op Note (Signed)
COLONOSCOPY PROCEDURE REPORT  PATIENT:  [name]  MR#:  [id number] Birthdate:  12-Sep-1940, 73 y.o., male Endoscopist:  Dr. Rogene Houston, MD Referred By:  Dr. Starr Sinclair. Everette Rank, MD Procedure Date: 08/15/2013  Procedure:   Colonoscopy.  Indications:  Patient is 73 year old Caucasian male who is here for surveillance colonoscopy. He has history of colonic adenomas. His last exam January 2010 was incomplete and followed by barium enema.  Informed Consent:  The procedure and risks were reviewed with the patient and informed consent was obtained.  Medications:  Demerol 50 mg IV Versed 6 mg IV  Description of procedure:  After a digital rectal exam was performed, that colonoscope was advanced from the anus through the rectum and colon to the area of ascending colon but could not be advanced to cecum. Resolution clip was placed for location verification.  As the scope was slowly and cautiously withdrawn. The mucosal surfaces were carefully surveyed utilizing scope tip to flexion to facilitate fold flattening as needed. The scope was pulled down into the rectum where a thorough exam including retroflexion was performed.  Findings:   Prep excellent. Scope could not be advanced into the cecum. Single resolution clip was placed for location verification and plain abdominal film revealed it to be in the mid ascending colon. Scattered diverticula noted at sigmoid and descending colon. No polyps were identified and the segments have her examined. Normal rectal mucosa. [facility] from prior hemorrhoidectomy.    Therapeutic/Diagnostic Maneuvers Performed:   Single resolution clip was applied for location verification.  Complications:  None  Cecal Withdrawal Time: NA  Impression:  Examination performed to ascending colon but cecum could not be reached. Position confirmed by applying single resolution clip to mucosa of ascending colon. Scattered diverticuli involving sigmoid and  descending colon. No evidence of recurrent polyps.  Recommendations:  Standard instructions given. Virtual colonoscopy in few weeks.  REHMAN,NAJEEB U  08/15/2013 2:32 PM  CC: Dr. Lanette Hampshire, MD & Dr. Rayne Du ref. provider found

## 2013-08-15 NOTE — Discharge Instructions (Signed)
Resume usual medications and high fiber diet. No driving for 24 hours. Further recommendations to follow. Colonoscopy, Care After Refer to this sheet in the next few weeks. These instructions provide you with information on caring for yourself after your procedure. Your health care provider may also give you more specific instructions. Your treatment has been planned according to current medical practices, but problems sometimes occur. Call your health care provider if you have any problems or questions after your procedure. WHAT TO EXPECT AFTER THE PROCEDURE  After your procedure, it is typical to have the following:  A small amount of blood in your stool.  Moderate amounts of gas and mild abdominal cramping or bloating. HOME CARE INSTRUCTIONS  Do not drive, operate machinery, or sign important documents for 24 hours.  You may shower and resume your regular physical activities, but move at a slower pace for the first 24 hours.  Take frequent rest periods for the first 24 hours.  Walk around or put a warm pack on your abdomen to help reduce abdominal cramping and bloating.  Drink enough fluids to keep your urine clear or pale yellow.  You may resume your normal diet as instructed by your health care provider. Avoid heavy or fried foods that are hard to digest.  Avoid drinking alcohol for 24 hours or as instructed by your health care provider.  Only take over-the-counter or prescription medicines as directed by your health care provider.  If a tissue sample (biopsy) was taken during your procedure:  Do not take aspirin or blood thinners for 7 days, or as instructed by your health care provider.  Do not drink alcohol for 7 days, or as instructed by your health care provider.  Eat soft foods for the first 24 hours. SEEK MEDICAL CARE IF: You have persistent spotting of blood in your stool 2-3 days after the procedure. SEEK IMMEDIATE MEDICAL CARE IF:  You have more than a small  spotting of blood in your stool.  You pass large blood clots in your stool.  Your abdomen is swollen (distended).  You have nausea or vomiting.  You have a fever.  You have increasing abdominal pain that is not relieved with medicine. Diverticulosis Diverticulosis is the condition that develops when small pouches (diverticula) form in the wall of your colon. Your colon, or large intestine, is where water is absorbed and stool is formed. The pouches form when the inside layer of your colon pushes through weak spots in the outer layers of your colon. CAUSES  No one knows exactly what causes diverticulosis. RISK FACTORS  Being older than 79. Your risk for this condition increases with age. Diverticulosis is rare in people younger than 40 years. By age 27, almost everyone has it.  Eating a low-fiber diet.  Being frequently constipated.  Being overweight.  Not getting enough exercise.  Smoking.  Taking over-the-counter pain medicines, like aspirin and ibuprofen. SYMPTOMS  Most people with diverticulosis do not have symptoms. DIAGNOSIS  Because diverticulosis often has no symptoms, health care providers often discover the condition during an exam for other colon problems. In many cases, a health care provider will diagnose diverticulosis while using a flexible scope to examine the colon (colonoscopy). TREATMENT  If you have never developed an infection related to diverticulosis, you may not need treatment. If you have had an infection before, treatment may include:  Eating more fruits, vegetables, and grains.  Taking a fiber supplement.  Taking a live bacteria supplement (probiotic).  Taking medicine  to relax your colon. HOME CARE INSTRUCTIONS   Drink at least 6-8 glasses of water each day to prevent constipation.  Try not to strain when you have a bowel movement.  Keep all follow-up appointments. If you have had an infection before:  Increase the fiber in your diet as  directed by your health care provider or dietitian.  Take a dietary fiber supplement if your health care provider approves.  Only take medicines as directed by your health care provider. SEEK MEDICAL CARE IF:   You have abdominal pain.  You have bloating.  You have cramps.  You have not gone to the bathroom in 3 days. SEEK IMMEDIATE MEDICAL CARE IF:   Your pain gets worse.  Yourbloating becomes very bad.  You have a fever or chills, and your symptoms suddenly get worse.  You begin vomiting.  You have bowel movements that are bloody or black. MAKE SURE YOU:  Understand these instructions.  Will watch your condition.  Will get help right away if you are not doing well or get worse. High-Fiber Diet Fiber is found in fruits, vegetables, and grains. A high-fiber diet encourages the addition of more whole grains, legumes, fruits, and vegetables in your diet. The recommended amount of fiber for adult males is 38 g per day. For adult females, it is 25 g per day. Pregnant and lactating women should get 28 g of fiber per day. If you have a digestive or bowel problem, ask your caregiver for advice before adding high-fiber foods to your diet. Eat a variety of high-fiber foods instead of only a select few type of foods.  PURPOSE  To increase stool bulk.  To make bowel movements more regular to prevent constipation.  To lower cholesterol.  To prevent overeating. WHEN IS THIS DIET USED?  It may be used if you have constipation and hemorrhoids.  It may be used if you have uncomplicated diverticulosis (intestine condition) and irritable bowel syndrome.  It may be used if you need help with weight management.  It may be used if you want to add it to your diet as a protective measure against atherosclerosis, diabetes, and cancer. SOURCES OF FIBER  Whole-grain breads and cereals.  Fruits, such as apples, oranges, bananas, berries, prunes, and pears.  Vegetables, such as green  peas, carrots, sweet potatoes, beets, broccoli, cabbage, spinach, and artichokes.  Legumes, such split peas, soy, lentils.  Almonds. FIBER CONTENT IN FOODS Starches and Grains / Dietary Fiber (g)  Cheerios, 1 cup / 3 g  Corn Flakes cereal, 1 cup / 0.7 g  Rice crispy treat cereal, 1 cup / 0.3 g  Instant oatmeal (cooked),  cup / 2 g  Frosted wheat cereal, 1 cup / 5.1 g  Brown, long-grain rice (cooked), 1 cup / 3.5 g  White, long-grain rice (cooked), 1 cup / 0.6 g  Enriched macaroni (cooked), 1 cup / 2.5 g Legumes / Dietary Fiber (g)  Baked beans (canned, plain, or vegetarian),  cup / 5.2 g  Kidney beans (canned),  cup / 6.8 g  Pinto beans (cooked),  cup / 5.5 g Breads and Crackers / Dietary Fiber (g)  Plain or honey graham crackers, 2 squares / 0.7 g  Saltine crackers, 3 squares / 0.3 g  Plain, salted pretzels, 10 pieces / 1.8 g  Whole-wheat bread, 1 slice / 1.9 g  White bread, 1 slice / 0.7 g  Raisin bread, 1 slice / 1.2 g  Plain bagel, 3 oz / 2  g  Flour tortilla, 1 oz / 0.9 g  Corn tortilla, 1 small / 1.5 g  Hamburger or hotdog bun, 1 small / 0.9 g Fruits / Dietary Fiber (g)  Apple with skin, 1 medium / 4.4 g  Sweetened applesauce,  cup / 1.5 g  Banana,  medium / 1.5 g  Grapes, 10 grapes / 0.4 g  Orange, 1 small / 2.3 g  Raisin, 1.5 oz / 1.6 g  Melon, 1 cup / 1.4 g Vegetables / Dietary Fiber (g)  Green beans (canned),  cup / 1.3 g  Carrots (cooked),  cup / 2.3 g  Broccoli (cooked),  cup / 2.8 g  Peas (cooked),  cup / 4.4 g  Mashed potatoes,  cup / 1.6 g  Lettuce, 1 cup / 0.5 g  Corn (canned),  cup / 1.6 g  Tomato,  cup / 1.1 g

## 2013-08-16 ENCOUNTER — Encounter (HOSPITAL_COMMUNITY): Payer: Self-pay | Admitting: Internal Medicine

## 2013-08-26 ENCOUNTER — Other Ambulatory Visit (INDEPENDENT_AMBULATORY_CARE_PROVIDER_SITE_OTHER): Payer: Self-pay | Admitting: Internal Medicine

## 2013-08-26 DIAGNOSIS — Q438 Other specified congenital malformations of intestine: Secondary | ICD-10-CM

## 2013-08-27 NOTE — Addendum Note (Signed)
Addended by: Rogene Houston on: 08/27/2013 08:20 AM   Modules accepted: Orders

## 2013-09-11 ENCOUNTER — Ambulatory Visit
Admission: RE | Admit: 2013-09-11 | Discharge: 2013-09-11 | Disposition: A | Discharge status: Home / Self Care | Payer: Medicare Other | Source: Ambulatory Visit | Attending: Internal Medicine | Admitting: Internal Medicine

## 2013-09-11 DIAGNOSIS — Q438 Other specified congenital malformations of intestine: Secondary | ICD-10-CM

## 2013-10-08 DIAGNOSIS — Z8546 Personal history of malignant neoplasm of prostate: Secondary | ICD-10-CM | POA: Insufficient documentation

## 2013-10-08 DIAGNOSIS — M5416 Radiculopathy, lumbar region: Secondary | ICD-10-CM | POA: Insufficient documentation

## 2013-10-18 DIAGNOSIS — M5430 Sciatica, unspecified side: Secondary | ICD-10-CM | POA: Insufficient documentation

## 2013-10-21 ENCOUNTER — Non-Acute Institutional Stay (SKILLED_NURSING_FACILITY): Payer: Medicare Other | Admitting: Internal Medicine

## 2013-10-21 DIAGNOSIS — M4014 Other secondary kyphosis, thoracic region: Secondary | ICD-10-CM

## 2013-10-21 DIAGNOSIS — T8112XA Postprocedural septic shock, initial encounter: Secondary | ICD-10-CM

## 2013-10-21 DIAGNOSIS — I69959 Hemiplegia and hemiparesis following unspecified cerebrovascular disease affecting unspecified side: Secondary | ICD-10-CM

## 2013-10-21 DIAGNOSIS — M4010 Other secondary kyphosis, site unspecified: Secondary | ICD-10-CM

## 2013-10-21 DIAGNOSIS — I5031 Acute diastolic (congestive) heart failure: Secondary | ICD-10-CM

## 2013-10-21 DIAGNOSIS — T8112XD Postprocedural septic shock, subsequent encounter: Secondary | ICD-10-CM

## 2013-10-21 DIAGNOSIS — I509 Heart failure, unspecified: Secondary | ICD-10-CM

## 2013-10-24 ENCOUNTER — Other Ambulatory Visit: Payer: Self-pay | Admitting: *Deleted

## 2013-10-24 MED ORDER — OXYCODONE HCL ER 10 MG PO T12A: EXTENDED_RELEASE_TABLET | ORAL | Status: DC

## 2013-10-24 NOTE — Telephone Encounter (Signed)
[facility] 

## 2013-10-28 DIAGNOSIS — I69959 Hemiplegia and hemiparesis following unspecified cerebrovascular disease affecting unspecified side: Secondary | ICD-10-CM | POA: Insufficient documentation

## 2013-10-28 DIAGNOSIS — I5031 Acute diastolic (congestive) heart failure: Secondary | ICD-10-CM | POA: Insufficient documentation

## 2013-10-28 DIAGNOSIS — T8110XA Postprocedural shock unspecified, initial encounter: Secondary | ICD-10-CM | POA: Insufficient documentation

## 2013-10-28 DIAGNOSIS — M40204 Unspecified kyphosis, thoracic region: Secondary | ICD-10-CM | POA: Insufficient documentation

## 2013-10-28 NOTE — Progress Notes (Addendum)
Patient ID: [name], male   DOB: 1940-12-19, 73 y.o.   MRN: [id number]               HISTORY & PHYSICAL  DATE:  10/21/2013    FACILITY: [facility]    LEVEL OF CARE:   SNF   CHIEF COMPLAINT:  Admission to SNF, post stay at [facility], 10/11/2013 through 10/19/2013.    HISTORY OF PRESENT ILLNESS:  This is a 73 year-old man who was admitted electively to [facility] with kyphosis of the thoracic spine, severe low back pain, left-sided sciatica with extensive limitation on his ability to get around.  He had had previous lumbar spine, L2/L5, posterior spinal fusion with flat-back syndrome.  He underwent extensive surgery, removal of hardware from L2/L5, of fusion L2/L5.  Use of locally harvested autograft for augmentation and fusion from T3 to the pelvis and use of cortical calcaneus allograft for augmentation of fusion T3 to the pelvis.  This was done through a lateral extracavitary approach.  He had considerable perioperative/postoperative bleeding, requiring five units of blood intraoperatively and two more in the PACU as well as three units of fresh frozen plasma.  He went into  respiratory failure postoperatively, requiring intubation.  He was transferred to the Neurosurgical ICU.    On 10/12/2013, a neurologic decline was noted.  Head CT scan showed left parietal occipital watershed infarcts in the anterior cerebral artery and middle cerebral artery territories with carotids opened by CTA.  Work-up was initiated.  Echo showed worsening right ventricular function and the possibility of a right ventricular thrombus.    Cardiology did a repeat echocardiogram on him.  There was slightly reduced left ventricular function with an ejection fraction of 49%, hypokinesis of the mid and anterior segments at the anterior septal left ventricular wall.  Right ventricular was mildly dilated with moderately reduced function.  No thrombus was seen.  The overall  feeling with Cardiology was that he had coronary artery disease relatively more affecting the LAD which during the bleeding episode had led to stunned myocardium.  It was suggested that he be aggressively treated for coronary artery disease with ASA, statin, beta blocker, ACE inhibitors, and to keep the hemoglobin above 8.    PAST MEDICAL HISTORY/PROBLEM LIST:    The patient states he has absolutely no past medical history and was not on any medications.  However, viewing [facility] reveals a history of:    Thrombocytopenia.    Rectal bleeding.    Osteoarthritis of the right knee.  Status post right total knee replacement.    History of adenomatous colonic polyps.    Common bile duct stricture.    CURRENT MEDICATIONS:  Discharge medications include:        ASA 81 q.d.    Dulcolax 10 mg daily p.r.n.      Valium 5 mg q.6 p.r.n.       Heparin 5000 every eight hours.    Lisinopril 10 mg daily.    Metoprolol 25 b.i.d.      OxyContin 10 mg q.12.    Oxycodone 15 mg q.3 p.r.n.       Senokot 2 tablets by mouth nightly.    SOCIAL HISTORY:      HOUSING:   The patient tells me he lives in [place].   FUNCTIONAL STATUS:  Was independent with ADLs and IADLs, although had very limited exercise tolerance due to pain, perhaps 20 yards.    FAMILY HISTORY:  None related by the patient.    REVIEW OF SYSTEMS:   CHEST/RESPIRATORY:  No shortness of breath.   CARDIAC:   No history of exertional chest pain.   GI:  States some constipation.  No nausea, vomiting or diarrhea.    GU:  States he is voiding well.    PHYSICAL EXAMINATION:   GENERAL APPEARANCE:   The patient is not in any distress.       CHEST/RESPIRATORY:  Clear air entry bilaterally.     CARDIOVASCULAR:  CARDIAC:   Heart sounds are normal.  There are no murmurs.  No signs of congestive heart failure.   GASTROINTESTINAL:  LIVER/SPLEEN/KIDNEYS:  No liver, no spleen.  No tenderness is noted.   ABDOMEN:  No masses.       GENITOURINARY:  BLADDER:   Not distended.   CIRCULATION:   EDEMA/VARICOSITIES:  Extremities:  There are venous stasis changes, but no edema.   MUSCULOSKELETAL:   BACK:  Extensive incision from the upper thoracic spine all the way down to his pelvis.  Sutures are still in.  There is no evidence of infection or drainage.   NEUROLOGICAL:    CRANIAL NERVES:  Cranial nerves are intact.   MOTOR:  Motor, power, and tone in the upper extremities are normal.  In the lower extremities, he has barely antigravity strength proximally in the left leg.  His right leg is slightly better, but still weak proximally.  3+-4/5 hip abductor.  Distal plantar and dorsiflexion are intact.     DEEP TENDON REFLEXES:  2+ at the knee jerks.   Both plantar responses were flexor.   PSYCHIATRIC:   MENTAL STATUS:   He seems to converse normally, although he states the biggest problem since the strokes are his speech.  No evidence of depression.     ASSESSMENT/PLAN:  Extensive surgery on this man, as noted.  The major difficulty currently appears to be profound lower extremity weakness, left greater than right.  This may be residual surgical weakness at this point/disuse.    Dominant hemisphere stroke in the perioperative phase.  The patient states that most of his deficits right now are related to speech.  He does not seem to have a lot of obvious findings related to this, although some of his right leg weakness may be related.     Stunned myocardium at the time of perioperative hemorrhage.  He has some evidence of both left and right ventricular problems.  He was put on beta blocker, ACE inhibitor, aspirin, but he is not currently on a statin.    On heparin subcu for DVT prophylaxis.  To continue until he is more mobile.  I will continue this for now.

## 2013-11-15 DIAGNOSIS — R7881 Bacteremia: Secondary | ICD-10-CM | POA: Insufficient documentation

## 2013-11-15 DIAGNOSIS — M4620 Osteomyelitis of vertebra, site unspecified: Secondary | ICD-10-CM | POA: Insufficient documentation

## 2013-12-05 DIAGNOSIS — Z789 Other specified health status: Secondary | ICD-10-CM | POA: Insufficient documentation

## 2013-12-05 DIAGNOSIS — Z7409 Other reduced mobility: Secondary | ICD-10-CM | POA: Insufficient documentation

## 2014-01-24 DIAGNOSIS — N179 Acute kidney failure, unspecified: Secondary | ICD-10-CM | POA: Insufficient documentation

## 2014-02-07 ENCOUNTER — Ambulatory Visit (INDEPENDENT_AMBULATORY_CARE_PROVIDER_SITE_OTHER): Payer: Medicare Other | Admitting: Urology

## 2014-02-07 DIAGNOSIS — N521 Erectile dysfunction due to diseases classified elsewhere: Secondary | ICD-10-CM

## 2014-02-07 DIAGNOSIS — R312 Other microscopic hematuria: Secondary | ICD-10-CM

## 2014-02-07 DIAGNOSIS — N508 Other specified disorders of male genital organs: Secondary | ICD-10-CM

## 2014-02-07 DIAGNOSIS — N320 Bladder-neck obstruction: Secondary | ICD-10-CM

## 2014-02-07 DIAGNOSIS — Z8546 Personal history of malignant neoplasm of prostate: Secondary | ICD-10-CM

## 2014-04-02 ENCOUNTER — Other Ambulatory Visit (HOSPITAL_COMMUNITY): Payer: Self-pay | Admitting: Family Medicine

## 2014-04-02 DIAGNOSIS — R229 Localized swelling, mass and lump, unspecified: Principal | ICD-10-CM

## 2014-04-02 DIAGNOSIS — IMO0002 Reserved for concepts with insufficient information to code with codable children: Secondary | ICD-10-CM

## 2014-04-02 DIAGNOSIS — N63 Unspecified lump in unspecified breast: Secondary | ICD-10-CM

## 2014-04-03 ENCOUNTER — Ambulatory Visit
Admission: RE | Admit: 2014-04-03 | Discharge: 2014-04-03 | Disposition: A | Discharge status: Home / Self Care | Payer: Medicare Other | Source: Ambulatory Visit | Attending: Family Medicine | Admitting: Family Medicine

## 2014-04-03 ENCOUNTER — Other Ambulatory Visit (HOSPITAL_COMMUNITY): Payer: Self-pay | Admitting: Family Medicine

## 2014-04-03 DIAGNOSIS — N63 Unspecified lump in unspecified breast: Secondary | ICD-10-CM

## 2014-04-04 ENCOUNTER — Ambulatory Visit (INDEPENDENT_AMBULATORY_CARE_PROVIDER_SITE_OTHER): Payer: Medicare Other | Admitting: Urology

## 2014-04-04 DIAGNOSIS — R312 Other microscopic hematuria: Secondary | ICD-10-CM

## 2014-04-04 DIAGNOSIS — N5201 Erectile dysfunction due to arterial insufficiency: Secondary | ICD-10-CM

## 2014-04-04 DIAGNOSIS — N3941 Urge incontinence: Secondary | ICD-10-CM

## 2014-04-11 ENCOUNTER — Other Ambulatory Visit (HOSPITAL_COMMUNITY): Payer: Self-pay | Admitting: Family Medicine

## 2014-04-11 DIAGNOSIS — N63 Unspecified lump in unspecified breast: Secondary | ICD-10-CM

## 2014-04-16 ENCOUNTER — Ambulatory Visit
Admission: RE | Admit: 2014-04-16 | Discharge: 2014-04-16 | Disposition: A | Discharge status: Home / Self Care | Payer: Medicare Other | Source: Ambulatory Visit | Attending: Family Medicine | Admitting: Family Medicine

## 2014-04-16 DIAGNOSIS — N63 Unspecified lump in unspecified breast: Secondary | ICD-10-CM

## 2014-07-18 ENCOUNTER — Ambulatory Visit (INDEPENDENT_AMBULATORY_CARE_PROVIDER_SITE_OTHER): Payer: Medicare Other | Admitting: Urology

## 2014-07-18 DIAGNOSIS — N5201 Erectile dysfunction due to arterial insufficiency: Secondary | ICD-10-CM | POA: Diagnosis not present

## 2014-07-18 DIAGNOSIS — Z8546 Personal history of malignant neoplasm of prostate: Secondary | ICD-10-CM | POA: Diagnosis not present

## 2014-07-18 DIAGNOSIS — R312 Other microscopic hematuria: Secondary | ICD-10-CM | POA: Diagnosis not present

## 2014-07-18 DIAGNOSIS — N3941 Urge incontinence: Secondary | ICD-10-CM | POA: Diagnosis not present

## 2014-07-18 DIAGNOSIS — N62 Hypertrophy of breast: Secondary | ICD-10-CM

## 2014-07-18 DIAGNOSIS — N320 Bladder-neck obstruction: Secondary | ICD-10-CM

## 2014-08-11 ENCOUNTER — Ambulatory Visit (INDEPENDENT_AMBULATORY_CARE_PROVIDER_SITE_OTHER): Payer: Medicare Other | Admitting: Internal Medicine

## 2014-08-11 ENCOUNTER — Other Ambulatory Visit (HOSPITAL_COMMUNITY)
Admission: RE | Admit: 2014-08-11 | Discharge: 2014-08-11 | Disposition: A | Discharge status: Home / Self Care | Payer: Medicare Other | Source: Other Acute Inpatient Hospital | Attending: Internal Medicine | Admitting: Internal Medicine

## 2014-08-11 ENCOUNTER — Encounter: Payer: Self-pay | Admitting: Internal Medicine

## 2014-08-11 VITALS — BP 140/80 | HR 82 | Ht 74.0 in | Wt 253.0 lb

## 2014-08-11 DIAGNOSIS — R072 Precordial pain: Secondary | ICD-10-CM

## 2014-08-11 DIAGNOSIS — R011 Cardiac murmur, unspecified: Secondary | ICD-10-CM | POA: Diagnosis not present

## 2014-08-11 DIAGNOSIS — E785 Hyperlipidemia, unspecified: Secondary | ICD-10-CM | POA: Diagnosis not present

## 2014-08-11 DIAGNOSIS — R0602 Shortness of breath: Secondary | ICD-10-CM | POA: Diagnosis not present

## 2014-08-11 LAB — CBC WITH DIFFERENTIAL/PLATELET
BASOS ABS: 0 10*3/uL (ref 0.0–0.1)
Basophils Relative: 0 % (ref 0–1)
Eosinophils Absolute: 0.2 10*3/uL (ref 0.0–0.7)
Eosinophils Relative: 3 % (ref 0–5)
HCT: 38.8 % — ABNORMAL LOW (ref 39.0–52.0)
HEMOGLOBIN: 12.6 g/dL — AB (ref 13.0–17.0)
LYMPHS PCT: 23 % (ref 12–46)
Lymphs Abs: 1.1 10*3/uL (ref 0.7–4.0)
MCH: 30.3 pg (ref 26.0–34.0)
MCHC: 32.5 g/dL (ref 30.0–36.0)
MCV: 93.3 fL (ref 78.0–100.0)
MONO ABS: 0.4 10*3/uL (ref 0.1–1.0)
MONOS PCT: 9 % (ref 3–12)
NEUTROS ABS: 3.2 10*3/uL (ref 1.7–7.7)
Neutrophils Relative %: 65 % (ref 43–77)
Platelets: 162 10*3/uL (ref 150–400)
RBC: 4.16 MIL/uL — AB (ref 4.22–5.81)
RDW: 13.8 % (ref 11.5–15.5)
WBC: 4.9 10*3/uL (ref 4.0–10.5)

## 2014-08-11 LAB — BASIC METABOLIC PANEL
ANION GAP: 9 (ref 5–15)
BUN: 34 mg/dL — AB (ref 6–20)
CALCIUM: 9.1 mg/dL (ref 8.9–10.3)
CO2: 29 mmol/L (ref 22–32)
CREATININE: 2.14 mg/dL — AB (ref 0.61–1.24)
Chloride: 99 mmol/L — ABNORMAL LOW (ref 101–111)
GFR calc non Af Amer: 29 mL/min — ABNORMAL LOW (ref >60)
GFR, EST AFRICAN AMERICAN: 33 mL/min — AB (ref >60)
Glucose, Bld: 99 mg/dL (ref 65–99)
Potassium: 4.7 mmol/L (ref 3.5–5.1)
Sodium: 137 mmol/L (ref 135–145)

## 2014-08-11 LAB — D-DIMER, QUANTITATIVE: D-Dimer, Quant: 2.57 ug/mL-FEU — ABNORMAL HIGH (ref 0.00–0.48)

## 2014-08-11 NOTE — Patient Instructions (Addendum)
Your physician recommends that you schedule a follow-up appointment After test results.   Your physician recommends that you return for lab work [id number]  Your physician has requested that you have an echocardiogram. Echocardiography is a painless test that uses sound waves to create images of your heart. It provides your doctor with information about the size and shape of your heart and how well your heart's chambers and valves are working. This procedure takes approximately one hour. There are no restrictions for this procedure.  Thank you for choosing [facility]!

## 2014-08-11 NOTE — Progress Notes (Addendum)
Cardiology Office Note   Date:  08/11/2014   ID:  [name], DOB [birth year], MRN [id number]  PCP:  Lanette Hampshire, MD  Cardiologist:   Dorris Carnes, MD   No chief complaint on file.     History of Present Illness: [name] is a 74 y.o. male with a history of no history of CAD  REferred for CP   Pt says back in Aug had back surgery at [facility]  "Died twice because of blood loss"  Spent 2 months in hosp  Weak Now trys to exercise  Walks   When goes into yard with strenous work pain comes on.Pressure.  Cant get back to house  No wheezing  Cut back on activity    More SOB with walking  NO pain or SOB at restt  No PND  CT ango of chest in 2013 showed coronary calcification   No current outpatient prescriptions on file.   No current facility-administered medications for this visit.    Allergies:   Review of patient's allergies indicates no known allergies.   Past Medical History  Diagnosis Date  . Arthritis   . Numbness     left leg after back surgery  . Chronic back pain   . GERD (gastroesophageal reflux disease)   . Hx of adenomatous colonic polyps   . Schatzki's ring     History of  . Helicobacter pylori gastritis     Treated  . IBS (irritable bowel syndrome)   . Prostate ca     seed implants  . Complication of anesthesia     "hard to wake up after back surgery"    Past Surgical History  Procedure Laterality Date  . Appendectomy  age 64  . Back surgery  02/08    [facility]  . Back surgery  09/24/04    [facility], [facility]  . Back surgery  09/18/02    [place]  . Cholecystectomy  03/04/05    [facility], [facility]. Gangrenous cholecystitis complicated by abscess requiring percutaneous drainage. He also had common duct stones requiring ERCP and sphincterotomy.  . Ercp  03/02/05    [facility], [facility]. Normal-appearing biliary tree (gallbladder not image), status post sphincterotomy with recovery of small pieces of stone material, status post balloon occlusion cholangiogram.  . Cataract  extraction w/phaco  11/22/2010    Procedure: CATARACT EXTRACTION PHACO AND INTRAOCULAR LENS PLACEMENT ([place]);  Surgeon: Williams Che;  Location: [facility];  Service: Ophthalmology;  Laterality: Right;  CDE: 6.81  . Esophagogastroduodenoscopy  August 2005    Erosive esophagitis and Schatzki ring, small hiatal hernia  . Colonoscopy  August 2005    Scattered sigmoid diverticulosis, splenic flexure polyp. Hyperplastic  . Colonoscopy  1997    3 cm tubular adenoma and a sending colon  . Incomplete colonoscopy  December 2009    Left-sided diverticula, mid descending colon polyp, due to recurrent looping and redundancy exam was incomplete. It was felt that the mid colon was reached. Followup barium enema showed colon interposition between the liver and the diaphragm, redundant sigmoid colon but no colon mass or polyp identified.. Pathology revealed tubular adenoma.  . Esophagogastroduodenoscopy  01/2008    Mild reflux esophagitis, small hiatal hernia  . Colonoscopy  02/01/2011    Rourk-friable anal canal, hyperplastic rectal polyp  . Esophagogastroduodenoscopy  02/01/2011    Rourk-erosive reflux esophagitis, small hiatal hernia  . Esophagogastroduodenoscopy  10/26/2011    Dr. Eli Phillips gastric submucosal petechia (bx-benign ulceration), juxta ampullary duodenal diverticulum and some  mucosal edema involving 1st/2nd portion of duodenum with superficial erosions (bx-superficial ulceration/benign)  . Ercp  01/07/2012    Procedure: ENDOSCOPIC RETROGRADE CHOLANGIOPANCREATOGRAPHY (ERCP);  Surgeon: Rogene Houston, MD;  Location: [facility];  Service: Endoscopy;  Laterality: N/A;  possible biliary stenting  . Sphincterotomy  01/07/2012    Procedure: SPHINCTEROTOMY;  Surgeon: Rogene Houston, MD;  Location: [facility];  Service: Endoscopy;  Laterality: N/A;  Extended  . Biliary stent placement  01/07/2012    Procedure: BILIARY STENT PLACEMENT;  Surgeon: Rogene Houston, MD;  Location: [facility];  Service: Endoscopy;   Laterality: N/A;  . Ercp  02/21/2012    Procedure: ENDOSCOPIC RETROGRADE CHOLANGIOPANCREATOGRAPHY (ERCP);  Surgeon: Rogene Houston, MD;  Location: [facility];  Service: Endoscopy;  Laterality: N/A;  common bile duct stone and clip removed  . Spyglass cholangioscopy  02/21/2012    Procedure: SPYGLASS CHOLANGIOSCOPY;  Surgeon: Rogene Houston, MD;  Location: [facility];  Service: Endoscopy;  Laterality: N/A;  . Biliary stent placement  02/21/2012    Procedure: BILIARY STENT PLACEMENT;  Surgeon: Rogene Houston, MD;  Location: [facility];  Service: Endoscopy;  Laterality: N/A;  Biliary Stent Replacement  . Esophagogastroduodenoscopy  02/21/2012    Procedure: ESOPHAGOGASTRODUODENOSCOPY (EGD);  Surgeon: Rogene Houston, MD;  Location: [facility];  Service: Endoscopy;  Laterality: N/A;  . Eye surgery    . Total knee arthroplasty  03/21/2012    Procedure: TOTAL KNEE ARTHROPLASTY;  Surgeon: [name], MD;  Location: [facility];  Service: Orthopedics;  Laterality: Right;  right knee arthroplasty  . Ercp N/A 05/18/2012    Procedure: ENDOSCOPIC RETROGRADE CHOLANGIOPANCREATOGRAPHY (ERCP);  Surgeon: Rogene Houston, MD;  Location: [facility];  Service: Endoscopy;  Laterality: N/A;  stone and debri removal  . Spyglass cholangioscopy N/A 05/18/2012    Procedure: SPYGLASS CHOLANGIOSCOPY;  Surgeon: Rogene Houston, MD;  Location: [facility];  Service: Endoscopy;  Laterality: N/A;  . Colonoscopy N/A 08/15/2013    Procedure: COLONOSCOPY;  Surgeon: Rogene Houston, MD;  [facility];  Service: Endoscopy;  Laterality: N/A;  100     Social History:  The patient  reports that he has never smoked. He has never used smokeless tobacco. He reports that he does not drink alcohol or use illicit drugs.   Family History:  The patient's family history includes Coronary artery disease in his father and mother; Deep vein thrombosis in his daughter; Diabetes in his father and mother; Stomach cancer in his father. There is no history of  Hypotension, Anesthesia problems, Malignant hyperthermia, Pseudochol deficiency, or Colon cancer.    ROS:  Please see the history of present illness. All other systems are reviewed and  Negative to the above problem except as noted.    PHYSICAL EXAM: VS:  BP 140/80 mmHg  Pulse 82  Ht 6\' 2"  (1.88 m)  Wt 253 lb (114.76 kg)  BMI 32.47 kg/m2  SpO2 93%  GEN: Obese 74 yo in no acute distress HEENT: normal Neck: no JVD, carotid bruits, or masses Cardiac: RRR  Gr I/VI systolic murmur LSB to base , rubs, or gallops,no edema  Respiratory:  clear to auscultation bilaterally, normal work of breathing GI: soft, nontender, nondistended, + BS  No hepatomegaly  MS: no deformity Moving all extremities   Skin: warm and dry, no rash Neuro:  Strength and sensation are intact Psych: euthymic mood, full affect   EKG:  EKG is not ordered today.   Lipid Panel No results  found for: CHOL, TRIG, HDL, CHOLHDL, VLDL, LDLCALC, LDLDIRECT    Wt Readings from Last 3 Encounters:  08/11/14 253 lb (114.76 kg)  08/15/13 240 lb (108.863 kg)  07/23/13 248 lb 12.8 oz (112.855 kg)      ASSESSMENT AND PLAN: 1  CP  / SOB  Worrisome for angina Reviewed with pt  Recent labs Hgb was minimally down 11.9  He has had an extensive GI history and this is different   Would Get CBC, BMET, D Dimer today TSH normal Also sched echo to evaluate aortic valve and LV function. I lean toward L heart cath  Pt is a little anxious once risks explained  Said he may have ahd a small stroke after his long surgery at [facility] has some mild numbness in fingers L hand    But he wants to get active again  He should be on ASA and a statin  WIll check labs  2.  Murmur  Probable aortic sclerosis Will get echo to define as well as confirm LVEF  (with cr of 1.99 if has cath would not do LV gram)  3.  Anemia  Recheck CBC  4.  Lipids  LDL was 108 on recent check  SHould be on statin  WIll defer until work up done   5. Renal  Rechekc  renal function     Signed, Dorris Carnes, MD  08/11/2014 9:02 AM    [facility] [address], [place], [place]  [address] Phone: [phone]; Fax: [phone]

## 2014-08-12 ENCOUNTER — Ambulatory Visit (HOSPITAL_COMMUNITY)
Admission: RE | Admit: 2014-08-12 | Discharge: 2014-08-12 | Disposition: A | Discharge status: Home / Self Care | Payer: Medicare Other | Source: Ambulatory Visit | Attending: Internal Medicine | Admitting: Internal Medicine

## 2014-08-12 ENCOUNTER — Telehealth: Payer: Self-pay

## 2014-08-12 ENCOUNTER — Other Ambulatory Visit: Payer: Self-pay

## 2014-08-12 DIAGNOSIS — R011 Cardiac murmur, unspecified: Secondary | ICD-10-CM | POA: Diagnosis not present

## 2014-08-12 DIAGNOSIS — R0602 Shortness of breath: Secondary | ICD-10-CM | POA: Diagnosis not present

## 2014-08-12 DIAGNOSIS — R072 Precordial pain: Secondary | ICD-10-CM

## 2014-08-12 NOTE — Telephone Encounter (Signed)
Pt made aware will schedule [place] today after echo this morning

## 2014-08-12 NOTE — Telephone Encounter (Signed)
-----   Message from Fay Records, MD sent at 08/11/2014 11:01 PM EDT ----- Hgb is minimally decreased. Renal function is decreased som  Rel stable from last outpt check Lab to check for clot is elevated  Would recomm LE dopplers (venous) to eval for clot in legs

## 2014-08-18 ENCOUNTER — Other Ambulatory Visit: Payer: Self-pay

## 2014-10-17 ENCOUNTER — Ambulatory Visit: Payer: Medicare Other | Admitting: Urology

## 2014-11-07 ENCOUNTER — Encounter: Payer: Self-pay | Admitting: "Endocrinology

## 2014-11-18 ENCOUNTER — Telehealth: Payer: Self-pay

## 2014-11-18 NOTE — Telephone Encounter (Signed)
[name], He still needs to do the labs I ordered last time and came to clinic to discuss his pother options.

## 2014-11-18 NOTE — Telephone Encounter (Signed)
Pt states that the breast center would not do surgery. He wants to know what he should do. He cancelled his follow up appt for now because he had planned on coming back after his surgery.

## 2014-11-19 NOTE — Telephone Encounter (Signed)
Pt notified. He is not sure when he will have his labs done so he states he will call once he has these done to be put back on schedule.

## 2014-11-20 ENCOUNTER — Ambulatory Visit: Payer: Self-pay | Admitting: "Endocrinology

## 2014-11-21 ENCOUNTER — Ambulatory Visit (INDEPENDENT_AMBULATORY_CARE_PROVIDER_SITE_OTHER): Payer: Medicare Other | Admitting: Urology

## 2014-11-21 DIAGNOSIS — R3915 Urgency of urination: Secondary | ICD-10-CM | POA: Diagnosis not present

## 2014-11-21 DIAGNOSIS — N5201 Erectile dysfunction due to arterial insufficiency: Secondary | ICD-10-CM | POA: Diagnosis not present

## 2014-11-21 DIAGNOSIS — R350 Frequency of micturition: Secondary | ICD-10-CM

## 2014-11-21 DIAGNOSIS — C61 Malignant neoplasm of prostate: Secondary | ICD-10-CM | POA: Diagnosis not present

## 2014-11-26 ENCOUNTER — Other Ambulatory Visit: Payer: Self-pay | Admitting: Urology

## 2014-11-26 ENCOUNTER — Encounter: Payer: Self-pay | Admitting: "Endocrinology

## 2014-11-26 ENCOUNTER — Ambulatory Visit (INDEPENDENT_AMBULATORY_CARE_PROVIDER_SITE_OTHER): Payer: Medicare Other | Admitting: "Endocrinology

## 2014-11-26 VITALS — BP 163/74 | HR 63 | Ht 74.0 in | Wt 257.0 lb

## 2014-11-26 DIAGNOSIS — N62 Hypertrophy of breast: Secondary | ICD-10-CM | POA: Diagnosis not present

## 2014-11-26 DIAGNOSIS — N320 Bladder-neck obstruction: Secondary | ICD-10-CM

## 2014-11-26 NOTE — Progress Notes (Signed)
Subjective:    Patient ID: [name], male    DOB: [birth year],    Past Medical History  Diagnosis Date  . Arthritis   . Numbness     left leg after back surgery  . Chronic back pain   . GERD (gastroesophageal reflux disease)   . Hx of adenomatous colonic polyps   . Schatzki's ring     History of  . Helicobacter pylori gastritis     Treated  . IBS (irritable bowel syndrome)   . Prostate CA ([place])     seed implants  . Complication of anesthesia     "hard to wake up after back surgery"   Past Surgical History  Procedure Laterality Date  . Appendectomy  age 10  . Back surgery  02/08    [place]  . Back surgery  09/24/04    [facility]bar, [facility]  . Back surgery  09/18/02    [place]  . Cholecystectomy  03/04/05    [facility], [facility]. Gangrenous cholecystitis complicated by abscess requiring percutaneous drainage. He also had common duct stones requiring ERCP and sphincterotomy.  . Ercp  03/02/05    [facility], [facility]. Normal-appearing biliary tree (gallbladder not image), status post sphincterotomy with recovery of small pieces of stone material, status post balloon occlusion cholangiogram.  . Cataract extraction w/phaco  11/22/2010    Procedure: CATARACT EXTRACTION PHACO AND INTRAOCULAR LENS PLACEMENT ([place]);  Surgeon: Williams Che;  Location: [facility];  Service: Ophthalmology;  Laterality: Right;  CDE: 6.81  . Esophagogastroduodenoscopy  August 2005    Erosive esophagitis and Schatzki ring, small hiatal hernia  . Colonoscopy  August 2005    Scattered sigmoid diverticulosis, splenic flexure polyp. Hyperplastic  . Colonoscopy  1997    3 cm tubular adenoma and a sending colon  . Incomplete colonoscopy  December 2009    Left-sided diverticula, mid descending colon polyp, due to recurrent looping and redundancy exam was incomplete. It was felt that the mid colon was reached. Followup barium enema showed colon interposition between the liver and the diaphragm, redundant sigmoid colon but no colon mass or  polyp identified.. Pathology revealed tubular adenoma.  . Esophagogastroduodenoscopy  01/2008    Mild reflux esophagitis, small hiatal hernia  . Colonoscopy  02/01/2011    Rourk-friable anal canal, hyperplastic rectal polyp  . Esophagogastroduodenoscopy  02/01/2011    Rourk-erosive reflux esophagitis, small hiatal hernia  . Esophagogastroduodenoscopy  10/26/2011    Dr. Eli Phillips gastric submucosal petechia (bx-benign ulceration), juxta ampullary duodenal diverticulum and some mucosal edema involving 1st/2nd portion of duodenum with superficial erosions (bx-superficial ulceration/benign)  . Ercp  01/07/2012    Procedure: ENDOSCOPIC RETROGRADE CHOLANGIOPANCREATOGRAPHY (ERCP);  Surgeon: Rogene Houston, MD;  Location: [facility];  Service: Endoscopy;  Laterality: N/A;  possible biliary stenting  . Sphincterotomy  01/07/2012    Procedure: SPHINCTEROTOMY;  Surgeon: Rogene Houston, MD;  Location: [facility];  Service: Endoscopy;  Laterality: N/A;  Extended  . Biliary stent placement  01/07/2012    Procedure: BILIARY STENT PLACEMENT;  Surgeon: Rogene Houston, MD;  Location: [facility];  Service: Endoscopy;  Laterality: N/A;  . Ercp  02/21/2012    Procedure: ENDOSCOPIC RETROGRADE CHOLANGIOPANCREATOGRAPHY (ERCP);  Surgeon: Rogene Houston, MD;  Location: [facility];  Service: Endoscopy;  Laterality: N/A;  common bile duct stone and clip removed  . Spyglass cholangioscopy  02/21/2012    Procedure: SPYGLASS CHOLANGIOSCOPY;  Surgeon: Rogene Houston, MD;  Location: [facility];  Service: Endoscopy;  Laterality:  N/A;  . Biliary stent placement  02/21/2012    Procedure: BILIARY STENT PLACEMENT;  Surgeon: Rogene Houston, MD;  Location[facility];  Service: Endoscopy;  Laterality: N/A;  Biliary Stent Replacement  . Esophagogastroduodenoscopy  02/21/2012    Procedure: ESOPHAGOGASTRODUODENOSCOPY (EGD);  Surgeon: Rogene Houston, MD;  Location: [facility];  Service: Endoscopy;  Laterality: N/A;  . Eye surgery    . Total knee  arthroplasty  03/21/2012    Procedure: TOTAL KNEE ARTHROPLASTY;  Surgeon: [name], MD;  Location: [facility];  Service: Orthopedics;  Laterality: Right;  right knee arthroplasty  . Ercp N/A 05/18/2012    Procedure: ENDOSCOPIC RETROGRADE CHOLANGIOPANCREATOGRAPHY (ERCP);  Surgeon: Rogene Houston, MD;  Location: [facility];  Service: Endoscopy;  Laterality: N/A;  stone and debri removal  . Spyglass cholangioscopy N/A 05/18/2012    Procedure: SPYGLASS CHOLANGIOSCOPY;  Surgeon: Rogene Houston, MD;  Location: [facility];  Service: Endoscopy;  Laterality: N/A;  . Colonoscopy N/A 08/15/2013    Procedure: COLONOSCOPY;  Surgeon: Rogene Houston, MD;  [facility];  Service: Endoscopy;  Laterality: N/A;  100   Social History   Social History  . Marital Status: Married    Spouse Name: N/A  . Number of Children: 2  . Years of Education: N/A   Occupational History  . Retired Airline pilot    Social History Main Topics  . Smoking status: Never Smoker   . Smokeless tobacco: Never Used  . Alcohol Use: No  . Drug Use: No  . Sexual Activity: Yes    Birth Control/ Protection: None   Other Topics Concern  . None   Social History Narrative   No outpatient encounter prescriptions on file as of 11/26/2014.   No facility-administered encounter medications on file as of 11/26/2014.   ALLERGIES: No Known Allergies VACCINATION STATUS: Immunization History  Administered Date(s) Administered  . Influenza Split 01/09/2012    HPI  [name] is a 74 yr old male with medical hx as above. He is here to f/u for gynecomastia. Since his last visit, he visited the [facility]. However he was informed that he would not require surgical therapy for his bilateral and painful gynecomastia. His labs show normal limits of hCG and alpha-fetoprotein. he was found to have hypogonadism associated with elevated estradiol. about 9 months ago, he started to notice enlargement of his bilateral breasts, progressively  reaching a level of discomfort and  became tender. He underwent ultrasound guided FNA of the left breast on April 17, 2014 which revealed "gynecomastia, no atypia or malignancy". He continued to have bilateral breast tenderness. He reports that the size of his breast did not change significantly since last time. he has ED.He is being treated for prostate cancer.   Review of Systems  Constitutional: no weight gain/loss, no fatigue, no subjective hyperthermia/hypothermia Eyes: no blurry vision, no xerophthalmia ENT: no sore throat, no nodules palpated in throat, no dysphagia/odynophagia, no hoarseness Cardiovascular: no CP/SOB/palpitations/leg swelling Respiratory: no cough/SOB Gastrointestinal: no N/V/D/C Musculoskeletal: no muscle/joint aches Skin: no rashes, reports tender bilateral gynecomastia. Neurological: no tremors/numbness/tingling/dizziness Psychiatric: no depression/anxiety  Objective:    BP 163/74 mmHg  Pulse 63  Ht 6\' 2"  (1.88 m)  Wt 257 lb (116.574 kg)  BMI 32.98 kg/m2  SpO2 99%  Wt Readings from Last 3 Encounters:  11/26/14 257 lb (116.574 kg)  08/20/14 252 lb (114.306 kg)  08/11/14 253 lb (114.76 kg)    Physical Exam   Constitutional: Well nourished;  No acute distress HEENT: Head normocephalic and atraumatic; Lips, teeth, tongue, and gums unremarkable; Exophthalmos, unspecified; Lid retraction or lag Psychiatric:  Alert and oriented to person, place, and time; Mood and affect appropriate for situation Neck: Trachea midline; Thyroid normal Cardiovascular: Heart rate and rhythm regular; Normal S1 and S2; No murmurs, rubs, or gallops Respiratory: Respiratory effort unremarkable; Respiratory rate and pattern normal; Lungs clear to auscultation bilaterally Gastrointestinal:  Gastrointestinal unremarkable :No CVA tenderness; Bladder non-tender Musculoskeletal Range of motion, motor, and sensory exam grossly normal  Additional Comments:his breasts are enlarged  , too tender to examine for masses or discharge. No skin color, contour change. No change since last exam 3 months ago.   Results for orders placed or performed during the hospital encounter of 60/63/01  Basic metabolic panel  Result Value Ref Range   Sodium 137 135 - 145 mmol/L   Potassium 4.7 3.5 - 5.1 mmol/L   Chloride 99 (L) 101 - 111 mmol/L   CO2 29 22 - 32 mmol/L   Glucose, Bld 99 65 - 99 mg/dL   BUN 34 (H) 6 - 20 mg/dL   Creatinine, Ser 2.14 (H) 0.61 - 1.24 mg/dL   Calcium 9.1 8.9 - 10.3 mg/dL   GFR calc non Af Amer 29 (L) >60 mL/min   GFR calc Af Amer 33 (L) >60 mL/min   Anion gap 9 5 - 15  CBC with Differential/Platelet  Result Value Ref Range   WBC 4.9 4.0 - 10.5 K/uL   RBC 4.16 (L) 4.22 - 5.81 MIL/uL   Hemoglobin 12.6 (L) 13.0 - 17.0 g/dL   HCT 38.8 (L) 39.0 - 52.0 %   MCV 93.3 78.0 - 100.0 fL   MCH 30.3 26.0 - 34.0 pg   MCHC 32.5 30.0 - 36.0 g/dL   RDW 13.8 11.5 - 15.5 %   Platelets 162 150 - 400 K/uL   Neutrophils Relative % 65 43 - 77 %   Neutro Abs 3.2 1.7 - 7.7 K/uL   Lymphocytes Relative 23 12 - 46 %   Lymphs Abs 1.1 0.7 - 4.0 K/uL   Monocytes Relative 9 3 - 12 %   Monocytes Absolute 0.4 0.1 - 1.0 K/uL   Eosinophils Relative 3 0 - 5 %   Eosinophils Absolute 0.2 0.0 - 0.7 K/uL   Basophils Relative 0 0 - 1 %   Basophils Absolute 0.0 0.0 - 0.1 K/uL  D-dimer, quantitative (not at [facility])  Result Value Ref Range   D-Dimer, Quant 2.57 (H) 0.00 - 0.48 ug/mL-FEU   Complete Blood Count (Most recent): Lab Results  Component Value Date   WBC 4.9 08/11/2014   HGB 12.6* 08/11/2014   HCT 38.8* 08/11/2014   MCV 93.3 08/11/2014   PLT 162 08/11/2014   Chemistry (most recent): Lab Results  Component Value Date   NA 137 08/11/2014   K 4.7 08/11/2014   CL 99* 08/11/2014   CO2 29 08/11/2014   BUN 34* 08/11/2014   CREATININE 2.14* 08/11/2014     on 11/20/2014 TSH 3.38, free T4 1 0.1, alpha-fetoprotein tumor marker 1.1 (normal less than 6.1), prolactin 8.3, total  testosterone 285 (normal 250- 827), total quantitative hCG less than 2 (normal less than 5).    Assessment & Plan:   1. Hypertrophy of breast  He has benign bilateral gynecomastia which is exquisitely tender. review of his labs show mild hypogonadism ( testosterone 285) along with high estradiol of 50 .5 ( 0-39). this hormone imbalance potentially explains bilateral  gynecomastia. Given hx of prostate cancer, he is not a good candidate for androgen replacement.   on 11/20/2014 TSH 3.38, free T4 1 0.1, alpha-fetoprotein tumor marker 1.1 (normal less than 6.1), prolactin 8.3, total testosterone 285 (normal 250- 827), total quantitative hCG less than 2 (normal less than 5). At breast Center she was told that he is not a surgical candidate.  Off label use of tamoxifen , SERM, as  an option to help relieve pain and possibly arrest further growth of breasts was discussed and offered to patient. However, he remains hesitant to do so. He declined a prescription.  He explains that he would rather avoid medications. He agrees to obtain follow-up bilateral  breast ultrasound in 2-3 months time.  - US BREAST COMPLETE UNI LEFT INC AXILLA; Future - US BREAST COMPLETE UNI RIGHT INC AXILLA; Future    Follow up plan: Return in about 3 months (around 02/26/2015) for gynecomastia.  Glade Lloyd, MD Phone: [phone]  Fax: [phone]   11/26/2014, 8:56 PM

## 2014-11-28 ENCOUNTER — Ambulatory Visit (HOSPITAL_COMMUNITY)
Admission: RE | Admit: 2014-11-28 | Discharge: 2014-11-28 | Disposition: A | Discharge status: Home / Self Care | Payer: Medicare Other | Source: Ambulatory Visit | Attending: Urology | Admitting: Urology

## 2014-11-28 DIAGNOSIS — N320 Bladder-neck obstruction: Secondary | ICD-10-CM | POA: Insufficient documentation

## 2014-11-28 DIAGNOSIS — Z8546 Personal history of malignant neoplasm of prostate: Secondary | ICD-10-CM | POA: Diagnosis not present

## 2014-11-30 IMAGING — CT CT VIRTUAL COLONOSCOPY DIAGNOSTIC
3 of 7 series · 12 of 36 positions shown, 18 images · non-contrast
Comparison: None.

CLINICAL DATA: Incomplete colonoscopy for weeks ago due to colonic
tortuosity, history of prostate carcinoma in 7777 with implantation
of radioactive seeds

EXAM:
CT VIRTUAL COLONOSCOPY DIAGNOSTIC
TECHNIQUE: The patient was given a standard magnesium citrate and suppositories
bowel preparation with Gastrografin and barium for fluid and stool
tagging respectively. The quality of the bowel preparation is
moderate to poor. Automated CO2 insufflation of the colon was
performed prior to image acquisition and colonic distention is
moderate to poor. Image post processing was used to generate a 3D
endoluminal fly-through projection of the colon and to
electronically subtract stool/fluid as appropriate.

[Series 6: prone (id) · axial · 0.86mm/px · z∈[-501,-34]mm · 8 of 482 slices shown, 13 images (1 of 2)]
[im 54/482  soft-tissue]
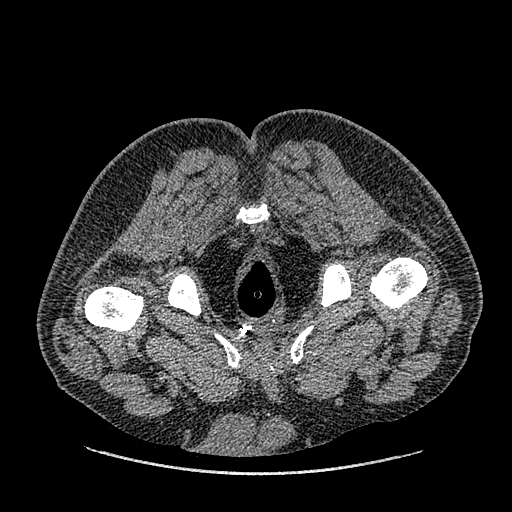
[im 54/482  bone]
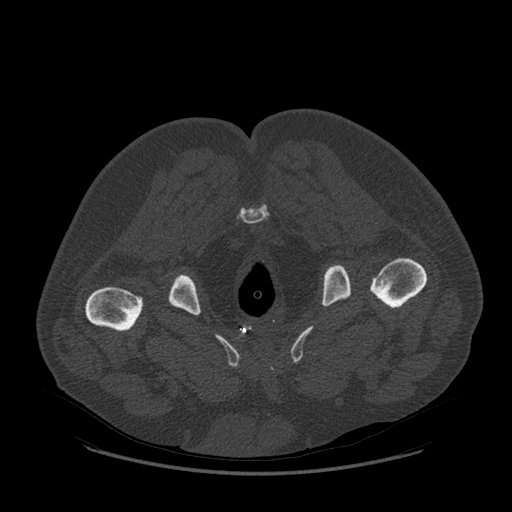
[im 107/482  soft-tissue]
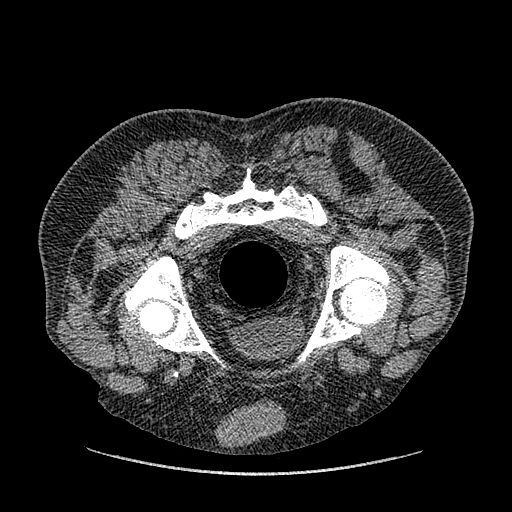
[im 161/482  soft-tissue]
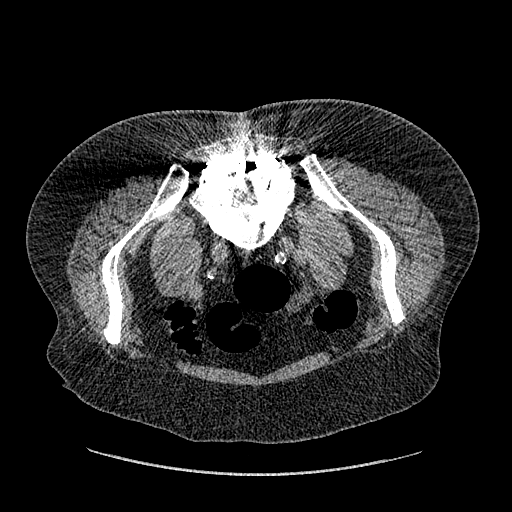
[im 214/482  soft-tissue]
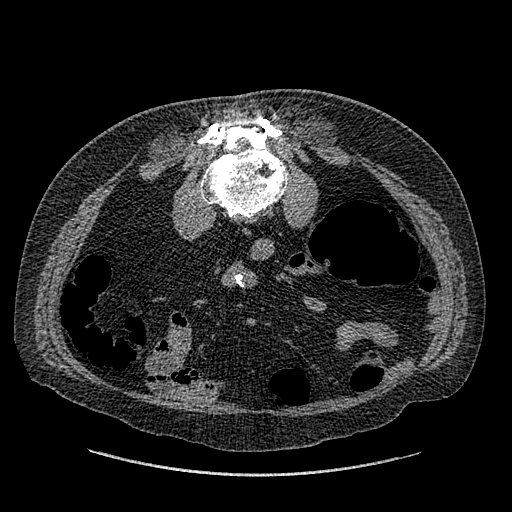
[im 268/482  soft-tissue]
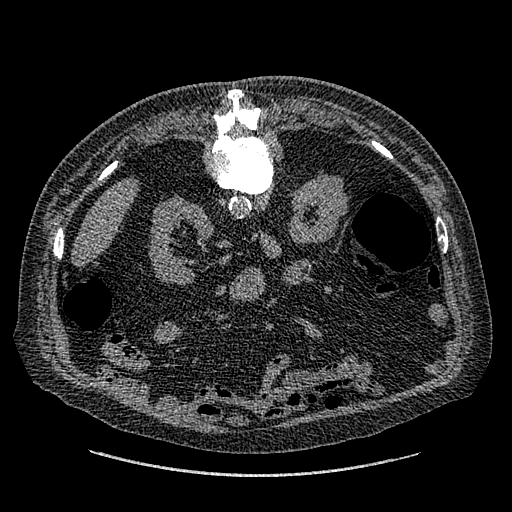
[im 268/482  lung]
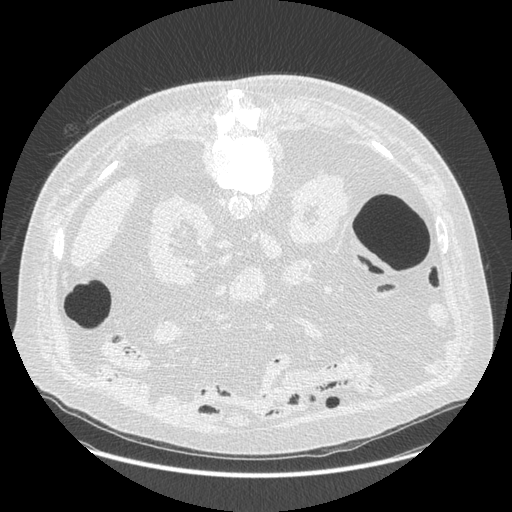
[im 321/482  soft-tissue]
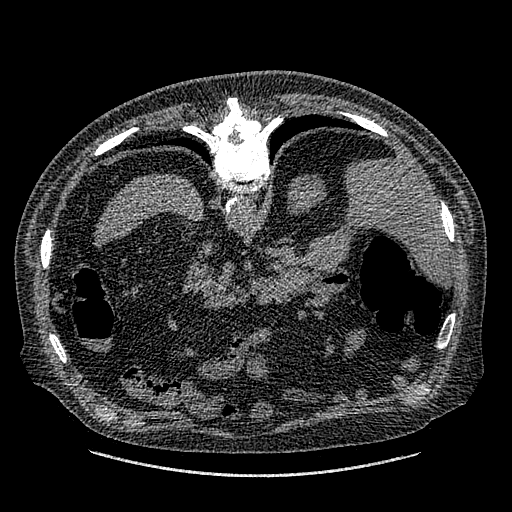
[im 321/482  lung]
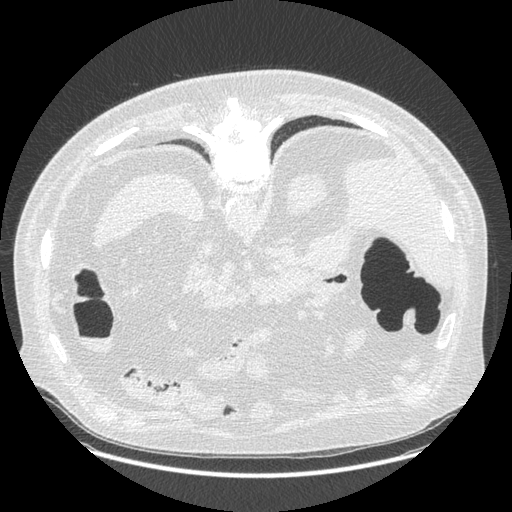
[im 375/482  soft-tissue]
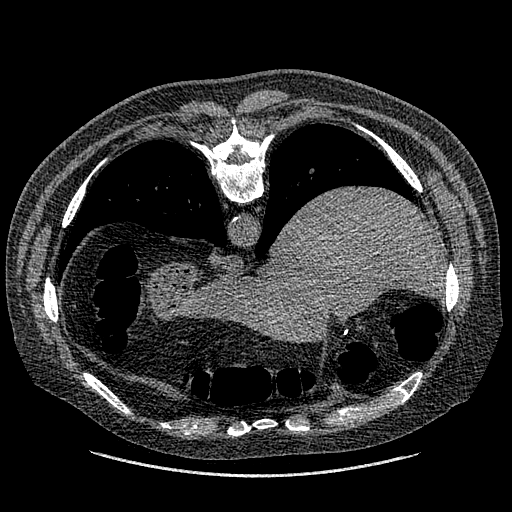
[im 375/482  lung]
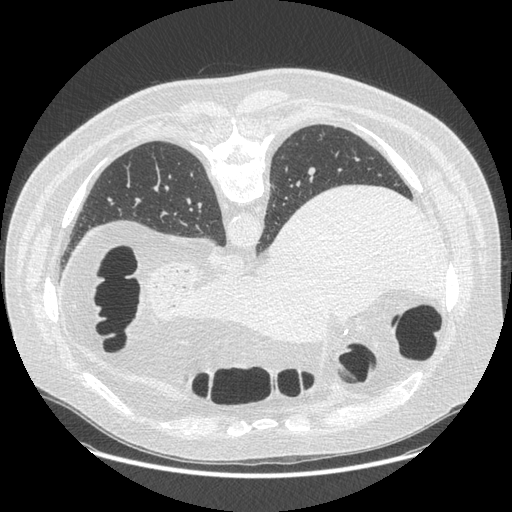
[im 428/482  soft-tissue]
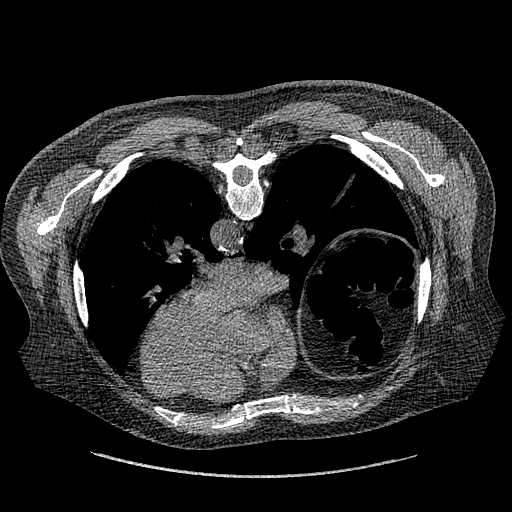
[im 428/482  lung]
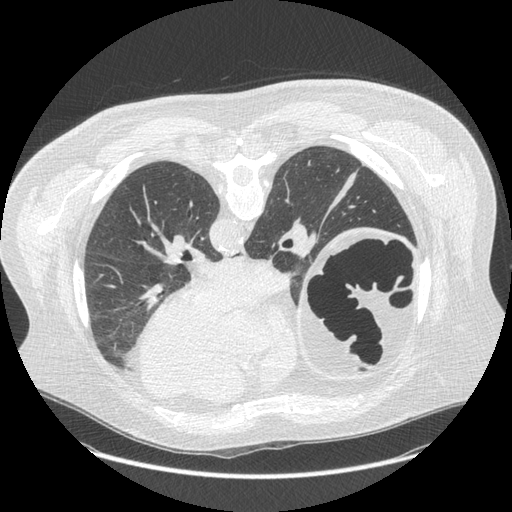

[Series 7: prone (id) · axial · 0.86mm/px · z∈[-416,-116]mm · 3 of 241 slices shown (2 of 2)]
[im 61/241  soft-tissue]
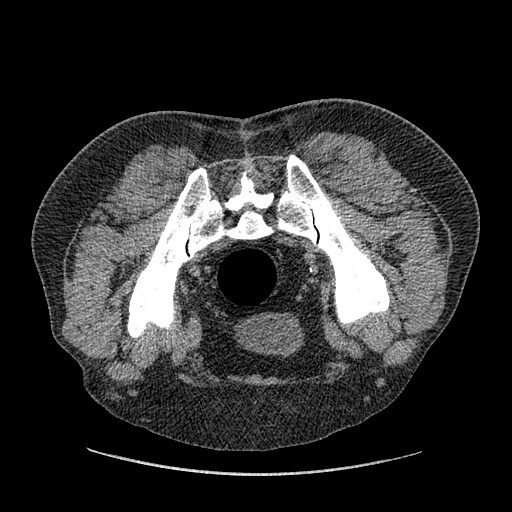
[im 121/241  soft-tissue]
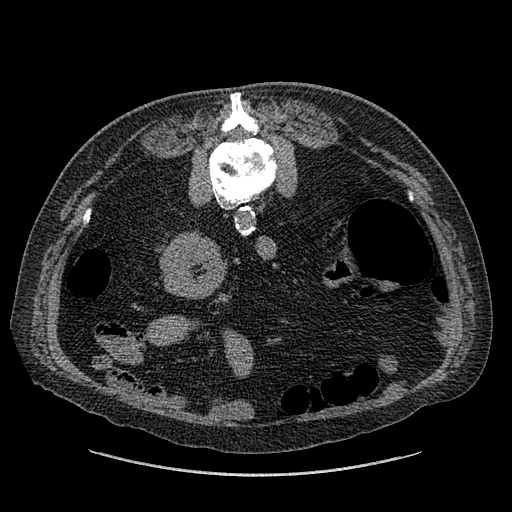
[im 181/241  soft-tissue]
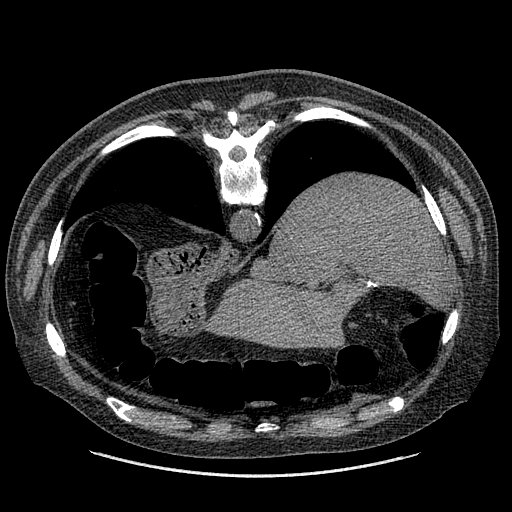

[Series 601: coronal body · coronal · 1.06mm/px · 1 of 142 slices shown, 2 images]
[im 48/142  soft-tissue]
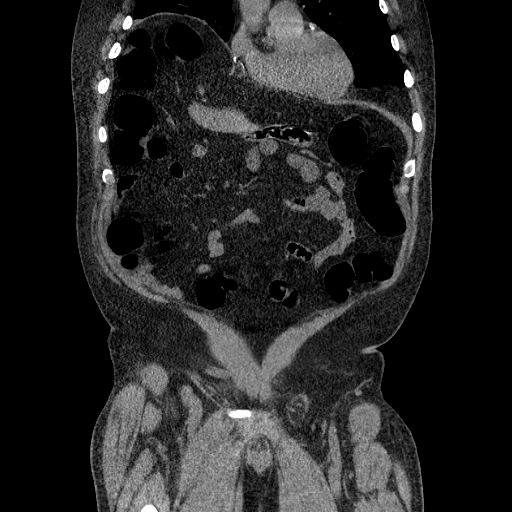
[im 48/142  bone]
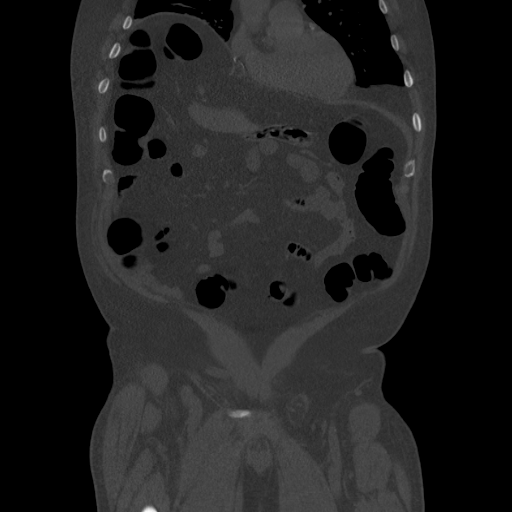

[12 of 36 positions shown; findings below may reference images not displayed]

FINDINGS: Both 2D and 3D images were reviewed. The entire colon is elongated
and there is tortuosity particularly throughout the rectosigmoid
colon. Also the retained fluid and feces is not well tagged.
Multiple diverticula are present throughout the rectosigmoid and
descending colon. On the best distension possible in this patient,
no clinically significant polypoid lesion is seen. A prominent
lipomatous ileocecal valve is noted.
IMPRESSION: 1. This virtual colonoscopy is somewhat suboptimal due to lack of
adequate distension. However no clinically significant polypoid
lesion is seen and no constricting lesion is noted.
2. Very elongated and tortuous colon.
3. Lipomatosis ileocecal valve.

Virtual colonoscopy is not designed to detect diminutive polyps
(i.e., less than or equal to 5 mm), the presence or absence of which
may not affect clinical management.

CT ABDOMEN AND PELVIS WITHOUT CONTRAST
FINDINGS: The lung bases are clear with mild linear atelectasis or
scarring at the right lung base. The liver is unremarkable in the
unenhanced state. Surgical clips are present from prior
cholecystectomy. The pancreas is normal in size and the pancreatic
duct is not dilated. The adrenal glands and spleen are unremarkable.
The stomach is not well distended. No renal calculi are seen and
there is no evidence of mass or hydronephrosis. The abdominal aorta
is normal in caliber with atheromatous change present. No adenopathy
is seen.

The urinary bladder is decompressed. Prostate implant seeds are.
Again the colon is noted to be elongated and tortuous with multiple
diverticula particularly throughout the rectosigmoid colon. The
prominent lipomatous ileocecal valve is noted, and the terminal
ileum is unremarkable. There appear to be surgical clips present
secondary to prior appendectomy. Hardware for fusion from L2 -L5 is
noted with diffuse degenerative disc disease.

1. Elongated and tortuous colon.
2. Posterior lumbar fusion from L2-L5 with diffuse degenerative disc
disease.

## 2014-12-03 ENCOUNTER — Ambulatory Visit (INDEPENDENT_AMBULATORY_CARE_PROVIDER_SITE_OTHER): Payer: Medicare Other | Admitting: Urology

## 2014-12-03 DIAGNOSIS — R828 Abnormal findings on cytological and histological examination of urine: Secondary | ICD-10-CM

## 2014-12-03 DIAGNOSIS — Z8546 Personal history of malignant neoplasm of prostate: Secondary | ICD-10-CM | POA: Diagnosis not present

## 2014-12-03 DIAGNOSIS — N5201 Erectile dysfunction due to arterial insufficiency: Secondary | ICD-10-CM | POA: Diagnosis not present

## 2015-01-23 ENCOUNTER — Ambulatory Visit (INDEPENDENT_AMBULATORY_CARE_PROVIDER_SITE_OTHER): Payer: Medicare Other | Admitting: Urology

## 2015-01-23 DIAGNOSIS — R7989 Other specified abnormal findings of blood chemistry: Secondary | ICD-10-CM | POA: Diagnosis not present

## 2015-01-23 DIAGNOSIS — Z8546 Personal history of malignant neoplasm of prostate: Secondary | ICD-10-CM

## 2015-01-23 DIAGNOSIS — N5201 Erectile dysfunction due to arterial insufficiency: Secondary | ICD-10-CM

## 2015-01-23 DIAGNOSIS — N3941 Urge incontinence: Secondary | ICD-10-CM

## 2015-01-23 DIAGNOSIS — R3129 Other microscopic hematuria: Secondary | ICD-10-CM

## 2015-02-27 ENCOUNTER — Ambulatory Visit: Payer: Medicare Other | Admitting: "Endocrinology

## 2015-03-05 DIAGNOSIS — R030 Elevated blood-pressure reading, without diagnosis of hypertension: Secondary | ICD-10-CM | POA: Diagnosis not present

## 2015-03-05 DIAGNOSIS — R944 Abnormal results of kidney function studies: Secondary | ICD-10-CM | POA: Diagnosis not present

## 2015-03-05 DIAGNOSIS — Z8546 Personal history of malignant neoplasm of prostate: Secondary | ICD-10-CM | POA: Diagnosis not present

## 2015-03-30 DIAGNOSIS — R7301 Impaired fasting glucose: Secondary | ICD-10-CM | POA: Diagnosis not present

## 2015-03-30 DIAGNOSIS — R944 Abnormal results of kidney function studies: Secondary | ICD-10-CM | POA: Diagnosis not present

## 2015-03-30 DIAGNOSIS — E782 Mixed hyperlipidemia: Secondary | ICD-10-CM | POA: Diagnosis not present

## 2015-04-01 DIAGNOSIS — D72829 Elevated white blood cell count, unspecified: Secondary | ICD-10-CM | POA: Diagnosis not present

## 2015-04-01 DIAGNOSIS — Z8546 Personal history of malignant neoplasm of prostate: Secondary | ICD-10-CM | POA: Diagnosis not present

## 2015-04-01 DIAGNOSIS — R7301 Impaired fasting glucose: Secondary | ICD-10-CM | POA: Diagnosis not present

## 2015-04-01 DIAGNOSIS — R944 Abnormal results of kidney function studies: Secondary | ICD-10-CM | POA: Diagnosis not present

## 2015-04-24 ENCOUNTER — Ambulatory Visit (INDEPENDENT_AMBULATORY_CARE_PROVIDER_SITE_OTHER): Payer: PPO | Admitting: Urology

## 2015-04-24 DIAGNOSIS — R3129 Other microscopic hematuria: Secondary | ICD-10-CM

## 2015-04-24 DIAGNOSIS — R351 Nocturia: Secondary | ICD-10-CM

## 2015-04-24 DIAGNOSIS — N3941 Urge incontinence: Secondary | ICD-10-CM

## 2015-04-24 DIAGNOSIS — R3915 Urgency of urination: Secondary | ICD-10-CM | POA: Diagnosis not present

## 2015-05-06 DIAGNOSIS — R319 Hematuria, unspecified: Secondary | ICD-10-CM | POA: Diagnosis not present

## 2015-05-06 DIAGNOSIS — I129 Hypertensive chronic kidney disease with stage 1 through stage 4 chronic kidney disease, or unspecified chronic kidney disease: Secondary | ICD-10-CM | POA: Diagnosis not present

## 2015-05-06 DIAGNOSIS — R7989 Other specified abnormal findings of blood chemistry: Secondary | ICD-10-CM | POA: Diagnosis not present

## 2015-08-11 DIAGNOSIS — I10 Essential (primary) hypertension: Secondary | ICD-10-CM | POA: Diagnosis not present

## 2015-08-11 DIAGNOSIS — R319 Hematuria, unspecified: Secondary | ICD-10-CM | POA: Diagnosis not present

## 2015-08-11 DIAGNOSIS — N183 Chronic kidney disease, stage 3 (moderate): Secondary | ICD-10-CM | POA: Diagnosis not present

## 2015-08-11 DIAGNOSIS — Z8546 Personal history of malignant neoplasm of prostate: Secondary | ICD-10-CM | POA: Diagnosis not present

## 2015-08-11 DIAGNOSIS — I129 Hypertensive chronic kidney disease with stage 1 through stage 4 chronic kidney disease, or unspecified chronic kidney disease: Secondary | ICD-10-CM | POA: Diagnosis not present

## 2015-08-13 DIAGNOSIS — M1712 Unilateral primary osteoarthritis, left knee: Secondary | ICD-10-CM | POA: Diagnosis not present

## 2015-08-27 DIAGNOSIS — S83207A Unspecified tear of unspecified meniscus, current injury, left knee, initial encounter: Secondary | ICD-10-CM | POA: Diagnosis not present

## 2015-08-27 DIAGNOSIS — M1712 Unilateral primary osteoarthritis, left knee: Secondary | ICD-10-CM | POA: Diagnosis not present

## 2015-08-28 ENCOUNTER — Ambulatory Visit (INDEPENDENT_AMBULATORY_CARE_PROVIDER_SITE_OTHER): Payer: PPO | Admitting: Urology

## 2015-08-28 DIAGNOSIS — N401 Enlarged prostate with lower urinary tract symptoms: Secondary | ICD-10-CM

## 2015-08-28 DIAGNOSIS — C61 Malignant neoplasm of prostate: Secondary | ICD-10-CM

## 2015-08-28 DIAGNOSIS — R351 Nocturia: Secondary | ICD-10-CM | POA: Diagnosis not present

## 2015-09-03 DIAGNOSIS — M1712 Unilateral primary osteoarthritis, left knee: Secondary | ICD-10-CM | POA: Diagnosis not present

## 2015-09-04 DIAGNOSIS — I10 Essential (primary) hypertension: Secondary | ICD-10-CM | POA: Diagnosis not present

## 2015-09-04 DIAGNOSIS — R319 Hematuria, unspecified: Secondary | ICD-10-CM | POA: Diagnosis not present

## 2015-09-04 DIAGNOSIS — R079 Chest pain, unspecified: Secondary | ICD-10-CM | POA: Diagnosis not present

## 2015-09-04 DIAGNOSIS — N400 Enlarged prostate without lower urinary tract symptoms: Secondary | ICD-10-CM | POA: Diagnosis not present

## 2015-09-11 ENCOUNTER — Encounter: Payer: Self-pay | Admitting: Adult Health

## 2015-09-11 ENCOUNTER — Ambulatory Visit (INDEPENDENT_AMBULATORY_CARE_PROVIDER_SITE_OTHER): Payer: PPO | Admitting: Adult Health

## 2015-09-11 VITALS — BP 162/80 | HR 69 | Ht 75.0 in | Wt 255.0 lb

## 2015-09-11 DIAGNOSIS — I10 Essential (primary) hypertension: Secondary | ICD-10-CM

## 2015-09-11 DIAGNOSIS — N182 Chronic kidney disease, stage 2 (mild): Secondary | ICD-10-CM

## 2015-09-11 DIAGNOSIS — R072 Precordial pain: Secondary | ICD-10-CM | POA: Diagnosis not present

## 2015-09-11 NOTE — Progress Notes (Signed)
Cardiology Office Note   Date:  09/11/2015   ID:  LIZBETH WODRICH, DOB [birth year], MRN [id number]  PCP:  Wende Neighbors, MD  Cardiologist: Purvis Sheffield, NP   Chief Complaint  Patient presents with  . Hypertension  . Chest Pain      History of Present Illness: [name] is a 75 y.o. male who presents for ongoing assessment and management of CAD, and chest pain. Was seen by Dr. Harrington Challenger in June of 2016. He was recommended for cardiac cath, but was reluctant to proceed.   Echocardiogram 07/2014 Left ventricle: The cavity size was normal. There was mild  concentric hypertrophy. Systolic function was normal. The  estimated ejection fraction was approximately 55%. Doppler  parameters are consistent with abnormal left ventricular  relaxation (grade 1 diastolic dysfunction). Doppler parameters  are consistent with high ventricular filling pressure. - Regional wall motion abnormality: Hypokinesis of the mid  anteroseptal myocardium. - Ventricular septum: Septal motion showed abnormal function and  dyssynergy. These changes are consistent with intraventricular  conduction delay. - Aortic valve: Mildly calcified annulus. Trileaflet; mildly  thickened leaflets. Aortic valve sclerosis without stenosis.  There was mild regurgitation. - Mitral valve: Mildly calcified annulus. Mildly thickened leaflets  . There was trivial regurgitation. - Left atrium: The atrium was mildly dilated. Volume/bsa, ES,  (1-plane Simpson&'s, A2C): 30.6 ml/m^2. - Pulmonic valve: There was mild regurgitation.  He comes today with continued complaints of DOE and intermittent chest discomfort. He states his energy is low. He has CKD stage II, and did not follow back up to make a decision on having cardiac cath. He was seen by Dr. Nevada Crane, and was re-referred to cardiology. He is also being seen by Dr. Vela [facility]. According to accompanying note from Dr. Nevada Crane, most recent creatinine 1.84.  Past  Medical History  Diagnosis Date  . Arthritis   . Numbness     left leg after back surgery  . Chronic back pain   . GERD (gastroesophageal reflux disease)   . Hx of adenomatous colonic polyps   . Schatzki's ring     History of  . Helicobacter pylori gastritis     Treated  . IBS (irritable bowel syndrome)   . Prostate CA ([place])     seed implants  . Complication of anesthesia     "hard to wake up after back surgery"    Past Surgical History  Procedure Laterality Date  . Appendectomy  age 43  . Back surgery  02/08    [place]  . Back surgery  09/24/04    [facility], [facility]  . Back surgery  09/18/02    [place]  . Cholecystectomy  03/04/05    [facility], [facility]. Gangrenous cholecystitis complicated by abscess requiring percutaneous drainage. He also had common duct stones requiring ERCP and sphincterotomy.  . Ercp  03/02/05    [facility], [facility]. Normal-appearing biliary tree (gallbladder not image), status post sphincterotomy with recovery of small pieces of stone material, status post balloon occlusion cholangiogram.  . Cataract extraction w/phaco  11/22/2010    Procedure: CATARACT EXTRACTION PHACO AND INTRAOCULAR LENS PLACEMENT ([place]);  Surgeon: Williams Che;  Location: [facility];  Service: Ophthalmology;  Laterality: Right;  CDE: 6.81  . Esophagogastroduodenoscopy  August 2005    Erosive esophagitis and Schatzki ring, small hiatal hernia  . Colonoscopy  August 2005    Scattered sigmoid diverticulosis, splenic flexure polyp. Hyperplastic  . Colonoscopy  1997    3 cm tubular adenoma  and a sending colon  . Incomplete colonoscopy  December 2009    Left-sided diverticula, mid descending colon polyp, due to recurrent looping and redundancy exam was incomplete. It was felt that the mid colon was reached. Followup barium enema showed colon interposition between the liver and the diaphragm, redundant sigmoid colon but no colon mass or polyp identified.. Pathology revealed tubular adenoma.  . Esophagogastroduodenoscopy   01/2008    Mild reflux esophagitis, small hiatal hernia  . Colonoscopy  02/01/2011    [facility]-friable anal canal, hyperplastic rectal polyp  . Esophagogastroduodenoscopy  02/01/2011    [facility]-erosive reflux esophagitis, small hiatal hernia  . Esophagogastroduodenoscopy  10/26/2011    Dr. Eli Phillips gastric submucosal petechia (bx-benign ulceration), juxta ampullary duodenal diverticulum and some mucosal edema involving 1st/2nd portion of duodenum with superficial erosions (bx-superficial ulceration/benign)  . Ercp  01/07/2012    Procedure: ENDOSCOPIC RETROGRADE CHOLANGIOPANCREATOGRAPHY (ERCP);  Surgeon: Rogene Houston, MD;  Location: [facility];  Service: Endoscopy;  Laterality: N/A;  possible biliary stenting  . Sphincterotomy  01/07/2012    Procedure: SPHINCTEROTOMY;  Surgeon: Rogene Houston, MD;  Location: [facility];  Service: Endoscopy;  Laterality: N/A;  Extended  . Biliary stent placement  01/07/2012    Procedure: BILIARY STENT PLACEMENT;  Surgeon: Rogene Houston, MD;  Location: [facility];  Service: Endoscopy;  Laterality: N/A;  . Ercp  02/21/2012    Procedure: ENDOSCOPIC RETROGRADE CHOLANGIOPANCREATOGRAPHY (ERCP);  Surgeon: Rogene Houston, MD;  Location: [facility];  Service: Endoscopy;  Laterality: N/A;  common bile duct stone and clip removed  . Spyglass cholangioscopy  02/21/2012    Procedure: SPYGLASS CHOLANGIOSCOPY;  Surgeon: Rogene Houston, MD;  [facility]: [facility];  Service: Endoscopy;  Laterality: N/A;  . Biliary stent placement  02/21/2012    Procedure: BILIARY STENT PLACEMENT;  Surgeon: Rogene Houston, MD;  [facility];  Service: Endoscopy;  Laterality: N/A;  Biliary Stent Replacement  . Esophagogastroduodenoscopy  02/21/2012    Procedure: ESOPHAGOGASTRODUODENOSCOPY (EGD);  Surgeon: Rogene Houston, MD;  [facility];  Service: Endoscopy;  Laterality: N/A;  . Eye surgery    . Total knee arthroplasty  03/21/2012    Procedure: TOTAL KNEE ARTHROPLASTY;  Surgeon: [name], MD;   Location: [facility];  Service: Orthopedics;  Laterality: Right;  right knee arthroplasty  . Ercp N/A 05/18/2012    Procedure: ENDOSCOPIC RETROGRADE CHOLANGIOPANCREATOGRAPHY (ERCP);  Surgeon: Rogene Houston, MD;  Location[facility];  Service: Endoscopy;  Laterality: N/A;  stone and debri removal  . Spyglass cholangioscopy N/A 05/18/2012    Procedure: SPYGLASS CHOLANGIOSCOPY;  Surgeon: Rogene Houston, MD;  [facility];  Service: Endoscopy;  Laterality: N/A;  . Colonoscopy N/A 08/15/2013    Procedure: COLONOSCOPY;  Surgeon: Rogene Houston, MD;  [facility];  Service: Endoscopy;  Laterality: N/A;  100     Current Outpatient Prescriptions  Medication Sig Dispense Refill  . amLODipine (NORVASC) 5 MG tablet Take 5 mg by mouth daily.     No current facility-administered medications for this visit.    Allergies:   Review of patient's allergies indicates no known allergies.    Social History:  The patient  reports that he has never smoked. He has never used smokeless tobacco. He reports that he does not drink alcohol or use illicit drugs.   Family History:  The patient's family history includes Coronary artery disease in his father and mother; Deep vein thrombosis in his daughter; Diabetes in his father  and mother; Stomach cancer in his father. There is no history of Hypotension, Anesthesia problems, Malignant hyperthermia, Pseudochol deficiency, or Colon cancer.    ROS: All other systems are reviewed and negative. Unless otherwise mentioned in H&P    PHYSICAL EXAM: VS:  BP 162/80 mmHg  Pulse 69  Ht 6\' 3"  (1.905 m)  Wt 255 lb (115.667 kg)  BMI 31.87 kg/m2  SpO2 98% , BMI Body mass index is 31.87 kg/(m^2). GEN: Well nourished, well developed, in no acute distress HEENT: normal Neck: no JVD, carotid bruits, or masses Cardiac:RRR; no murmurs, rubs, or gallops,no edema  Respiratory:  clear to auscultation bilaterally, normal work of breathing GI: soft, nontender, nondistended, +  BS MS: no deformity or atrophy Skin: warm and dry, no rash Neuro:  Strength and sensation are intact Psych: euthymic mood, full affect   EKG:  EKG The ekg ordered today demonstrates NSR with RBBB, bifascicular block, rate 65 bpm.    Recent Labs: No results found for requested labs within last 365 days.    Lipid Panel No results found for: CHOL, TRIG, HDL, CHOLHDL, VLDL, LDLCALC, LDLDIRECT    Wt Readings from Last 3 Encounters:  09/11/15 255 lb (115.667 kg)  09/04/15 261 lb (118.389 kg)  11/26/14 257 lb (116.574 kg)        ASSESSMENT AND PLAN:  1.  Chest pain: Recommended for cardiac cath but is still reluctant. With CKD stage III will do a Lexiscan Stress Cardiolite first and then proceed with cath if abnormal, giving more evidence to move forward. He will continue his current regimen for now.   2. Hypertension: Not well controlled currently. Will increase amlodipine to 10 mg daily from 5 mg daily in the setting of CKD. No ACE or ARB at this time.  3. CKD: Being followed by Dr. Moshe Cipro with [facility]. Due to be seen in Sept.    Current medicines are reviewed at length with the patient today.    Labs/ tests ordered today include:   Orders Placed This Encounter  Procedures  . NM Myocar Multi W/Spect W/Wall Motion / EF  . EKG 12-Lead     Disposition:   FU with Post stress test.   Signed, Jory Sims, NP  09/11/2015 3:54 PM    [facility] [address] [place],  [address] Phone: [phone]; Fax: [phone]

## 2015-09-11 NOTE — Progress Notes (Signed)
Name: [name]    DOB: 04-23-1940  Age: 75 y.o.  MR#: [id number]       PCP:  Wende Neighbors, MD      Insurance: Payor: Tennis Must / Plan: Tennis Must / Product Type: *No Product type* /   CC:   No chief complaint on file.   VS Filed Vitals:   09/11/15 1440  BP: 162/80  Pulse: 69  Height: 6\' 3"  (1.905 m)  Weight: 255 lb (115.667 kg)  SpO2: 98%    Weights Current Weight  09/11/15 255 lb (115.667 kg)  09/04/15 261 lb (118.389 kg)  11/26/14 257 lb (116.574 kg)    Blood Pressure  BP Readings from Last 3 Encounters:  09/11/15 162/80  09/04/15 141/75  11/26/14 163/74     Admit date:  (Not on file) Last encounter with RMR:  Visit date not found   Allergy Review of patient's allergies indicates no known allergies.  Current Outpatient Prescriptions  Medication Sig Dispense Refill  . amLODipine (NORVASC) 5 MG tablet Take 5 mg by mouth daily.     No current facility-administered medications for this visit.    Discontinued Meds:   There are no discontinued medications.  Patient Active Problem List   Diagnosis Date Noted  . Hypertrophy of breast 11/26/2014  . Thoracic kyphosis 10/28/2013  . Hemiplegia affecting dominant side, late effect of cerebrovascular disease 10/28/2013  . Postoperative shock 10/28/2013  . Acute diastolic congestive heart failure ([place]) 10/28/2013  . Personal history of colonic polyps 07/23/2013  . Osteoarthritis of right knee 03/22/2012  . Common bile duct (CBD) stricture 01/16/2012  . Epigastric pain 01/19/2011  . Hx of adenomatous colonic polyps 01/19/2011  . Constipation 01/19/2011  . Change in bowel function 01/19/2011  . Rectal bleeding 01/19/2011  . Thrombocytopenia ([place]) 01/19/2011    LABS    Component Value Date/Time   NA 137 08/11/2014 0954   NA 139 07/23/2013 1117   NA 136 05/11/2012 1156   K 4.7 08/11/2014 0954   K 4.8 07/23/2013 1117   K 4.4 05/11/2012 1156   CL 99* 08/11/2014 0954   CL 98 07/23/2013 1117   CL 97 05/11/2012 1156   CO2 29 08/11/2014 0954   CO2 33* 07/23/2013 1117   CO2 30 05/11/2012 1156   GLUCOSE 99 08/11/2014 0954   GLUCOSE 97 07/23/2013 1117   GLUCOSE 112* 05/11/2012 1156   BUN 34* 08/11/2014 0954   BUN 17 07/23/2013 1117   BUN 16 05/11/2012 1156   CREATININE 2.14* 08/11/2014 0954   CREATININE 1.34 07/23/2013 1117   CREATININE 1.12 05/11/2012 1156   CREATININE 0.89 03/22/2012 0525   CALCIUM 9.1 08/11/2014 0954   CALCIUM 9.6 07/23/2013 1117   CALCIUM 9.9 05/11/2012 1156   GFRNONAA 29* 08/11/2014 0954   GFRNONAA 64* 05/11/2012 1156   GFRNONAA 83* 03/22/2012 0525   GFRAA 33* 08/11/2014 0954   GFRAA 74* 05/11/2012 1156   GFRAA >90 03/22/2012 0525   CMP     Component Value Date/Time   NA 137 08/11/2014 0954   K 4.7 08/11/2014 0954   CL 99* 08/11/2014 0954   CO2 29 08/11/2014 0954   GLUCOSE 99 08/11/2014 0954   BUN 34* 08/11/2014 0954   CREATININE 2.14* 08/11/2014 0954   CREATININE 1.34 07/23/2013 1117   CALCIUM 9.1 08/11/2014 0954   PROT 7.0 07/23/2013 1117   ALBUMIN 3.9 07/23/2013 1117   AST 30 07/23/2013 1117   ALT 30 07/23/2013 1117   ALKPHOS 65 07/23/2013  1117   BILITOT 0.6 07/23/2013 1117   GFRNONAA 29* 08/11/2014 0954   GFRAA 33* 08/11/2014 0954       Component Value Date/Time   WBC 4.9 08/11/2014 0954   WBC 8.6 03/22/2012 0525   WBC 4.8 03/13/2012 1251   HGB 12.6* 08/11/2014 0954   HGB 14.3 05/11/2012 1156   HGB 12.9* 03/22/2012 0525   HCT 38.8* 08/11/2014 0954   HCT 43.4 05/11/2012 1156   HCT 38.0* 03/22/2012 0525   MCV 93.3 08/11/2014 0954   MCV 90.7 03/22/2012 0525   MCV 90.4 03/13/2012 1251    Lipid Panel  No results found for: CHOL, TRIG, HDL, CHOLHDL, VLDL, LDLCALC, LDLDIRECT  ABG    Component Value Date/Time   TCO2 28 10/24/2011 0443     Lab Results  Component Value Date   TSH 2.862 01/19/2011   BNP (last 3 results) No results for input(s): BNP in the last 8760 hours.  ProBNP (last 3 results) No results for  input(s): PROBNP in the last 8760 hours.  Cardiac Panel (last 3 results) No results for input(s): CKTOTAL, CKMB, TROPONINI, RELINDX in the last 72 hours.  Iron/TIBC/Ferritin/ %Sat No results found for: IRON, TIBC, FERRITIN, IRONPCTSAT   EKG Orders placed or performed in visit on 09/11/15  . EKG 12-Lead     Prior Assessment and Plan Problem List as of 09/11/2015      Cardiovascular and Mediastinum   Acute diastolic congestive heart failure (HCC)     Digestive   Constipation   Rectal bleeding   Common bile duct (CBD) stricture     Nervous and Auditory   Hemiplegia affecting dominant side, late effect of cerebrovascular disease     Musculoskeletal and Integument   Osteoarthritis of right knee   Thoracic kyphosis     Other   Epigastric pain   Last Assessment & Plan 01/19/2011 Office Visit Written 01/19/2011 12:04 PM by Mahala Menghini, PA    Stabbing intermittent epigastric pain not necessarily related to meals. Also complains of bloating/trapped gas. Offered upper GI tract evaluation via endoscopy.  I have discussed the risks, alternatives, benefits with regards to but not limited to the risk of reaction to medication, bleeding, infection, perforation and the patient is agreeable to proceed. Written consent to be obtained.       Hx of adenomatous colonic polyps   Last Assessment & Plan 01/19/2011 Office Visit Written 01/19/2011 12:03 PM by Mahala Menghini, PA    History of adenomatous colonic polyps. Last colonoscopy incomplete requiring air-contrast barium enema to complement it. Due to other GI issues, he will be undergoing surveillance colonoscopy as well.      Change in bowel function   Last Assessment & Plan 01/19/2011 Office Visit Written 01/19/2011 12:02 PM by Mahala Menghini, PA    Recent onset of constipation. Baseline bowel movement typically once daily. Nothing dietary or medication wise to explain change. We'll check thyroid status. Plan for colonoscopy in light of  change in bowels, significant rectal bleeding. Notably patient had an incomplete colonoscopy last time. Consider use of pediatric scope if needed. Patient states he had adequate conscious sedation last time even with difficulty in the procedure. I have discussed the risks, alternatives, benefits with regards to but not limited to the risk of reaction to medication, bleeding, infection, perforation and the patient is agreeable to proceed. Written consent to be obtained.  Would advise she take MiraLax 17 g every evening that he does not have a bowel  movement. Increase dietary fiber intake. Handout provided.      Thrombocytopenia [facility])   Last Assessment & Plan 01/19/2011 Office Visit Written 01/19/2011 12:04 PM by Mahala Menghini, PA    Repeat CBC. If persistent thrombocytopenia, he will need further evaluation.      Personal history of colonic polyps   Postoperative shock   Hypertrophy of breast       Imaging: No results found.

## 2015-09-11 NOTE — Patient Instructions (Signed)
Your physician recommends that you schedule a follow-up appointment in:  After [name]     Your physician recommends that you continue on your current medications as directed. Please refer to the Current Medication list given to you today.    Your physician has requested that you have a lexiscan myoview. For further information please visit [email or web address]. Please follow instruction sheet, as given.      Thank you for choosing [facility] !

## 2015-09-15 DIAGNOSIS — M1712 Unilateral primary osteoarthritis, left knee: Secondary | ICD-10-CM | POA: Diagnosis not present

## 2015-09-16 ENCOUNTER — Encounter (HOSPITAL_COMMUNITY)
Admission: RE | Admit: 2015-09-16 | Discharge: 2015-09-16 | Disposition: A | Discharge status: Home / Self Care | Payer: PPO | Source: Ambulatory Visit | Attending: Adult Health | Admitting: Adult Health

## 2015-09-16 ENCOUNTER — Inpatient Hospital Stay (HOSPITAL_COMMUNITY): Admission: RE | Admit: 2015-09-16 | Payer: PPO | Source: Ambulatory Visit

## 2015-09-16 ENCOUNTER — Encounter (HOSPITAL_COMMUNITY): Payer: Self-pay

## 2015-09-16 DIAGNOSIS — R072 Precordial pain: Secondary | ICD-10-CM | POA: Insufficient documentation

## 2015-09-16 HISTORY — DX: Disorder of kidney and ureter, unspecified: N28.9

## 2015-09-16 LAB — NM MYOCAR MULTI W/SPECT W/WALL MOTION / EF
CHL CUP NUCLEAR SDS: 5
CHL CUP NUCLEAR SRS: 3
CHL CUP RESTING HR STRESS: 53 {beats}/min
LHR: 0.29
LV dias vol: 98 mL (ref 62–150)
LV sys vol: 46 mL
NUC STRESS TID: 1.03
Peak HR: 70 {beats}/min
SSS: 8

## 2015-09-16 MED ORDER — TECHNETIUM TC 99M TETROFOSMIN IV KIT
10.0000 | PACK | Freq: Once | INTRAVENOUS | Status: AC | PRN
  Administered 2015-09-16: 11 via INTRAVENOUS

## 2015-09-16 MED ORDER — TECHNETIUM TC 99M TETROFOSMIN IV KIT
30.0000 | PACK | Freq: Once | INTRAVENOUS | Status: AC | PRN
  Administered 2015-09-16: 33 via INTRAVENOUS

## 2015-09-16 MED ORDER — SODIUM CHLORIDE 0.9% FLUSH
INTRAVENOUS | Status: AC
  Administered 2015-09-16: 10 mL via INTRAVENOUS
  Filled 2015-09-16: qty 10

## 2015-09-16 MED ORDER — REGADENOSON 0.4 MG/5ML IV SOLN
INTRAVENOUS | Status: AC
  Administered 2015-09-16: 0.4 mg via INTRAVENOUS
  Filled 2015-09-16: qty 5

## 2015-09-22 ENCOUNTER — Ambulatory Visit (INDEPENDENT_AMBULATORY_CARE_PROVIDER_SITE_OTHER): Payer: PPO | Admitting: Cardiology

## 2015-09-22 ENCOUNTER — Encounter: Payer: Self-pay | Admitting: Cardiology

## 2015-09-22 DIAGNOSIS — Z8249 Family history of ischemic heart disease and other diseases of the circulatory system: Secondary | ICD-10-CM | POA: Diagnosis not present

## 2015-09-22 DIAGNOSIS — N183 Chronic kidney disease, stage 3 unspecified: Secondary | ICD-10-CM | POA: Insufficient documentation

## 2015-09-22 DIAGNOSIS — R9430 Abnormal result of cardiovascular function study, unspecified: Secondary | ICD-10-CM | POA: Diagnosis not present

## 2015-09-22 DIAGNOSIS — I452 Bifascicular block: Secondary | ICD-10-CM | POA: Diagnosis not present

## 2015-09-22 DIAGNOSIS — M1712 Unilateral primary osteoarthritis, left knee: Secondary | ICD-10-CM | POA: Diagnosis not present

## 2015-09-22 MED ORDER — NITROGLYCERIN 0.4 MG SL SUBL: 0.4000 mg | SUBLINGUAL_TABLET | SUBLINGUAL | 2 refills | Status: DC | PRN

## 2015-09-22 MED ORDER — ATORVASTATIN CALCIUM 80 MG PO TABS: 80.0000 mg | ORAL_TABLET | Freq: Every day | ORAL | 0 refills | Status: DC

## 2015-09-22 MED ORDER — ASPIRIN EC 81 MG PO TBEC: 81.0000 mg | DELAYED_RELEASE_TABLET | Freq: Every day | ORAL | 3 refills | Status: DC

## 2015-09-22 NOTE — Assessment & Plan Note (Signed)
RBBB, LAFB

## 2015-09-22 NOTE — Assessment & Plan Note (Signed)
CABG in her 40's

## 2015-09-22 NOTE — Assessment & Plan Note (Signed)
Inferior scar with ischemia on Myoview 09/16/15-

## 2015-09-22 NOTE — Progress Notes (Signed)
09/22/2015 Dian Situ   [birth year]  [id number]  Primary Physician Wende Neighbors, MD Primary Cardiologist: Dr Harrington Challenger  HPI:  75 y/o male followed by Dr Harrington Challenger and Dr Nevada Crane. He has had chest pain in the past. An echo in 2016 suggested anteroseptal HK with normal LVF. Coronary angiogram was suggested then but he declined. He had extensive back surgery at [facility] a few years ago that was complicated by stroke, MRSA, renal failure, and prolonged hospitalization. He recently saw his PCP and mentioned some DOE. The pt was referred to cardiology and saw Jory Sims 09/11/15. Myoview was arranged for 09/16/15. This revealed inferior scar and peri infarct ischemia. He is here to discuss the results. I reviewed this with the pt. I also reviewed his echo from 2016 which suggested a WMA suspicious for CAD. I suggested we proceed with coronary angiogram, noting that his CRI would be an issue we could handle with Dr Shelva Majestic help, the then pt told me Dr Moshe Cipro told him he "couldn't have a cath because of his renal function". It's clear the patient has some reservations about proceeding. On further interview he told me he wasn't having chest pain and wasn't really having DOE. He says "Im just getting old".    Current Outpatient Prescriptions  Medication Sig Dispense Refill  . amLODipine (NORVASC) 5 MG tablet Take 5 mg by mouth daily.    Marland Kitchen aspirin EC 81 MG tablet Take 1 tablet (81 mg total) by mouth daily. 90 tablet 3  . atorvastatin (LIPITOR) 80 MG tablet Take 1 tablet (80 mg total) by mouth daily. 90 tablet 0  . nitroGLYCERIN (NITROSTAT) 0.4 MG SL tablet Place 1 tablet (0.4 mg total) under the tongue every 5 (five) minutes as needed for chest pain. 25 tablet 2   No current facility-administered medications for this visit.     No Known Allergies  Social History   Social History  . Marital status: Married    Spouse name: N/A  . Number of children: 2  . Years of education: N/A   Occupational  History  . Retired Airline pilot Retired   Social History Main Topics  . Smoking status: Never Smoker  . Smokeless tobacco: Never Used  . Alcohol use No  . Drug use: No  . Sexual activity: Yes    Birth control/ protection: None   Other Topics Concern  . Not on file   Social History Narrative  . No narrative on file     Review of Systems: General: negative for chills, fever, night sweats or weight changes.  Cardiovascular: negative for chest pain, dyspnea on exertion, edema, orthopnea, palpitations, paroxysmal nocturnal dyspnea or shortness of breath Dermatological: negative for rash Respiratory: negative for cough or wheezing Urologic: negative for hematuria Abdominal: negative for nausea, vomiting, diarrhea, bright red blood per rectum, melena, or hematemesis Neurologic: negative for visual changes, syncope, or dizziness All other systems reviewed and are otherwise negative except as noted above.    Blood pressure 136/72, pulse 61, height 6\' 2"  (1.88 m), weight 250 lb (113.4 kg), SpO2 98 %.  General appearance: alert, cooperative and no distress Neck: no carotid bruit and no JVD Lungs: clear to auscultation bilaterally Heart: regular rate and rhythm and soft systolic murmur apex Extremities: no edema Pulses: 1-2+ bilateral LE Skin: Skin color, texture, turgor normal. No rashes or lesions Neurologic: Grossly normal   ASSESSMENT AND PLAN:   Abnormal cardiac function test Inferior scar with ischemia on Myoview 09/16/15-  Chronic renal  disease, stage III Followed by Dr Moshe Cipro, pt says last SCr 1.8  Family history of coronary artery disease in mother CABG in her 70's  Bifascicular block RBBB, LAFB   PLAN  Mr Loucks has an echo and Myoview suggestive of CAD. It's not clear to me that he is symptomatic but he may be downplaying his symptoms. He would be at risk for worsening renal function with a cath. I suggested we add medications and see how he does. I suggested  a baby ASA and a statin. His HR is too slow for beta blocker. He took some convincing but agreed to the above as well as NTG prn. He'll f/u with Dr Harrington Challenger in 6 weeks after a f/u lipid panel and CMET.   Diamontae Amigon PA-C 09/22/2015 3:44 PM

## 2015-09-22 NOTE — Assessment & Plan Note (Signed)
Followed by Dr Moshe Cipro, pt says last SCr 1.8

## 2015-09-22 NOTE — Patient Instructions (Signed)
Your physician recommends that you schedule a follow-up appointment in: 6 Weeks with Dr. Harrington Challenger  Your physician has recommended you make the following change in your medication:   Start Lipitor 80 mg Daily  Start Aspirin 81 mg Daily Start Nitro 0.4 mg As Needed for Chest Pain X 3 then seek medical attention.   Your physician recommends that you return for lab work in:( Fasting) 6 Weeks just before your visit.    If you need a refill on your cardiac medications before your next appointment, please call your pharmacy.  Thank you for choosing [facility]!

## 2015-09-29 DIAGNOSIS — M1712 Unilateral primary osteoarthritis, left knee: Secondary | ICD-10-CM | POA: Diagnosis not present

## 2015-10-05 ENCOUNTER — Ambulatory Visit: Payer: PPO | Admitting: Adult Health

## 2015-10-07 DIAGNOSIS — N183 Chronic kidney disease, stage 3 (moderate): Secondary | ICD-10-CM | POA: Diagnosis not present

## 2015-10-20 ENCOUNTER — Telehealth: Payer: Self-pay | Admitting: Internal Medicine

## 2015-10-20 NOTE — Telephone Encounter (Signed)
Too soon for labs,just started on lipitor 1 month ago,pt aware

## 2015-10-20 NOTE — Telephone Encounter (Signed)
Patient wants to know if he needs blood work prior to [place] on 10/23/15. / tg

## 2015-10-22 NOTE — Progress Notes (Signed)
Cardiology Office Note   Date:  10/23/2015   ID:  [name], DOB 14-Mar-1940, MRN [id number]  PCP:  Wende Neighbors, MD  Cardiologist:   Dorris Carnes, MD   F/U of CP , HTN       History of Present Illness: [name] is a 75 y.o. male with a history of CVA, MRSA, renal failure, DOE  Myoview for SOB showed interior scar and periinfarct ischemia  Cath recommended  He was seen by L Kilroy on 09/22/15  Michela Pitcher he was nt having any pain or SOB  ASA and statin added    I saw the pt once in June 2016  Echo in June 2016 LVEF 55 to 60^   hpokinesis of mid anteroseptum  AV sclerotic  No stenosis    Pt has occasional CP that are sharp  Last 30 sec  Not assocaited with activity No other chest pressure  No SOB    Tried lipitro  Stopped because he felt weak      Outpatient Medications Prior to Visit  Medication Sig Dispense Refill  . amLODipine (NORVASC) 5 MG tablet Take 5 mg by mouth daily.    Marland Kitchen aspirin EC 81 MG tablet Take 1 tablet (81 mg total) by mouth daily. 90 tablet 3  . atorvastatin (LIPITOR) 80 MG tablet Take 1 tablet (80 mg total) by mouth daily. 90 tablet 0  . nitroGLYCERIN (NITROSTAT) 0.4 MG SL tablet Place 1 tablet (0.4 mg total) under the tongue every 5 (five) minutes as needed for chest pain. 25 tablet 2   No facility-administered medications prior to visit.      Allergies:   Review of patient's allergies indicates no known allergies.   Past Medical History:  Diagnosis Date  . Arthritis   . Chronic back pain   . Complication of anesthesia    "hard to wake up after back surgery"  . GERD (gastroesophageal reflux disease)   . Helicobacter pylori gastritis    Treated  . Hx of adenomatous colonic polyps   . IBS (irritable bowel syndrome)   . Numbness    left leg after back surgery  . Prostate CA ([place])    seed implants  . Renal insufficiency   . Schatzki's ring    History of    Past Surgical History:  Procedure Laterality Date  . APPENDECTOMY  age 37  . BACK SURGERY   02/08   [place]  . BACK SURGERY  09/24/04   lumbar, [facility]  . BACK SURGERY  09/18/02   [place]  . BILIARY STENT PLACEMENT  01/07/2012   Procedure: BILIARY STENT PLACEMENT;  Surgeon: Rogene Houston, MD;  [facility];  Service: Endoscopy;  Laterality: N/A;  . BILIARY STENT PLACEMENT  02/21/2012   Procedure: BILIARY STENT PLACEMENT;  Surgeon: Rogene Houston, MD;  [facility];  Service: Endoscopy;  Laterality: N/A;  Biliary Stent Replacement  . CATARACT EXTRACTION W/PHACO  11/22/2010   Procedure: CATARACT EXTRACTION PHACO AND INTRAOCULAR LENS PLACEMENT (IOC);  Surgeon: Williams Che;  Location: [facility];  Service: Ophthalmology;  Laterality: Right;  CDE: 6.81  . CHOLECYSTECTOMY  03/04/05   [facility], [facility]. Gangrenous cholecystitis complicated by abscess requiring percutaneous drainage. He also had common duct stones requiring ERCP and sphincterotomy.  . COLONOSCOPY  August 2005   Scattered sigmoid diverticulosis, splenic flexure polyp. Hyperplastic  . COLONOSCOPY  1997   3 cm tubular adenoma and a sending colon  . COLONOSCOPY  02/01/2011  Rourk-friable anal canal, hyperplastic rectal polyp  . COLONOSCOPY N/A 08/15/2013   Procedure: COLONOSCOPY;  Surgeon: Rogene Houston, MD;  [facility];  Service: Endoscopy;  Laterality: N/A;  100  . ERCP  03/02/05   [facility], [facility]. Normal-appearing biliary tree (gallbladder not image), status post sphincterotomy with recovery of small pieces of stone material, status post balloon occlusion cholangiogram.  . ERCP  01/07/2012   Procedure: ENDOSCOPIC RETROGRADE CHOLANGIOPANCREATOGRAPHY (ERCP);  Surgeon: Rogene Houston, MD;  [facility];  Service: Endoscopy;  Laterality: N/A;  possible biliary stenting  . ERCP  02/21/2012   Procedure: ENDOSCOPIC RETROGRADE CHOLANGIOPANCREATOGRAPHY (ERCP);  Surgeon: Rogene Houston, MD;  [facility];  Service: Endoscopy;  Laterality: N/A;  common bile duct stone and clip removed  . ERCP N/A 05/18/2012    Procedure: ENDOSCOPIC RETROGRADE CHOLANGIOPANCREATOGRAPHY (ERCP);  Surgeon: Rogene Houston, MD;  [facility];  Service: Endoscopy;  Laterality: N/A;  stone and debri removal  . ESOPHAGOGASTRODUODENOSCOPY  August 2005   Erosive esophagitis and Schatzki ring, small hiatal hernia  . ESOPHAGOGASTRODUODENOSCOPY  01/2008   Mild reflux esophagitis, small hiatal hernia  . ESOPHAGOGASTRODUODENOSCOPY  02/01/2011   Rourk-erosive reflux esophagitis, small hiatal hernia  . ESOPHAGOGASTRODUODENOSCOPY  10/26/2011   Dr. Eli Phillips gastric submucosal petechia (bx-benign ulceration), juxta ampullary duodenal diverticulum and some mucosal edema involving 1st/2nd portion of duodenum with superficial erosions (bx-superficial ulceration/benign)  . ESOPHAGOGASTRODUODENOSCOPY  02/21/2012   Procedure: ESOPHAGOGASTRODUODENOSCOPY (EGD);  Surgeon: Rogene Houston, MD;  [facility];  Service: Endoscopy;  Laterality: N/A;  . EYE SURGERY    . Incomplete colonoscopy  December 2009   Left-sided diverticula, mid descending colon polyp, due to recurrent looping and redundancy exam was incomplete. It was felt that the mid colon was reached. Followup barium enema showed colon interposition between the liver and the diaphragm, redundant sigmoid colon but no colon mass or polyp identified.. Pathology revealed tubular adenoma.  . SPHINCTEROTOMY  01/07/2012   Procedure: SPHINCTEROTOMY;  Surgeon: Rogene Houston, MD;  [facility];  Service: Endoscopy;  Laterality: N/A;  Extended  . SPYGLASS CHOLANGIOSCOPY  02/21/2012   Procedure: SPYGLASS CHOLANGIOSCOPY;  Surgeon: Rogene Houston, MD;  [facility];  Service: Endoscopy;  Laterality: N/A;  . SPYGLASS CHOLANGIOSCOPY N/A 05/18/2012   Procedure: SPYGLASS CHOLANGIOSCOPY;  Surgeon: Rogene Houston, MD;  [facility];  Service: Endoscopy;  Laterality: N/A;  . TOTAL KNEE ARTHROPLASTY  03/21/2012   Procedure: TOTAL KNEE ARTHROPLASTY;  Surgeon: [name], MD;   Location: [facility];  Service: Orthopedics;  Laterality: Right;  right knee arthroplasty     Social History:  The patient  reports that he has never smoked. He has never used smokeless tobacco. He reports that he does not drink alcohol or use drugs.   Family History:  The patient's family history includes Coronary artery disease in his father and mother; Deep vein thrombosis in his daughter; Diabetes in his father and mother; Stomach cancer in his father.    ROS:  Please see the history of present illness. All other systems are reviewed and  Negative to the above problem except as noted.    PHYSICAL EXAM: VS:  BP 128/66   Pulse 62   Ht 6\' 2"  (1.88 m)   Wt 249 lb (112.9 kg)   SpO2 95%   BMI 31.97 kg/m   GEN: Well nourished, well developed, in no acute distress  HEENT: normal  Neck: no JVD, carotid bruits, or masses  Cardiac: RRR; no murmurs, rubs, or gallops,no edema  Respiratory:  clear to auscultation bilaterally, normal work of breathing GI: soft, nontender, nondistended, + BS  No hepatomegaly  MS: no deformity Moving all extremities   Skin: warm and dry, no rash Neuro:  Strength and sensation are intact Psych: euthymic mood, full affect   EKG:  EKG is not ordered today.   Lipid Panel No results found for: CHOL, TRIG, HDL, CHOLHDL, VLDL, LDLCALC, LDLDIRECT    Wt Readings from Last 3 Encounters:  10/23/15 249 lb (112.9 kg)  09/22/15 250 lb (113.4 kg)  09/11/15 255 lb (115.7 kg)      ASSESSMENT AND PLAN:  1  CP  Atypical for cardiac   He may have CAD  I am not convinced he has any active symptoms (assuming he is telling all)  Would keep on same regimen  COnsider PT eval  Will defer to primary MD  2.  Dyslipidemia  Will set up for fasting lipids   3  HTN Good control  F/U in April        Signed, Dorris Carnes, MD  10/23/2015 9:[address], [place], [place]  [address] Phone: [phone]; Fax: [phone]

## 2015-10-23 ENCOUNTER — Encounter: Payer: Self-pay | Admitting: Internal Medicine

## 2015-10-23 ENCOUNTER — Ambulatory Visit (INDEPENDENT_AMBULATORY_CARE_PROVIDER_SITE_OTHER): Payer: PPO | Admitting: Internal Medicine

## 2015-10-23 VITALS — BP 128/66 | HR 62 | Ht 74.0 in | Wt 249.0 lb

## 2015-10-23 DIAGNOSIS — I10 Essential (primary) hypertension: Secondary | ICD-10-CM | POA: Diagnosis not present

## 2015-10-23 DIAGNOSIS — R0789 Other chest pain: Secondary | ICD-10-CM | POA: Diagnosis not present

## 2015-10-23 DIAGNOSIS — E785 Hyperlipidemia, unspecified: Secondary | ICD-10-CM | POA: Diagnosis not present

## 2015-10-23 NOTE — Patient Instructions (Signed)
Your physician wants you to follow-up in: April 2018 with Dr.Ross You will receive a reminder letter in the mail two months in advance. If you don't receive a letter, please call our office to schedule the follow-up appointment.   Get FASTING lipid lab work      Thank you for choosing [facility] !

## 2015-10-27 DIAGNOSIS — I129 Hypertensive chronic kidney disease with stage 1 through stage 4 chronic kidney disease, or unspecified chronic kidney disease: Secondary | ICD-10-CM | POA: Diagnosis not present

## 2015-10-27 DIAGNOSIS — R319 Hematuria, unspecified: Secondary | ICD-10-CM | POA: Diagnosis not present

## 2015-10-27 DIAGNOSIS — Z6832 Body mass index (BMI) 32.0-32.9, adult: Secondary | ICD-10-CM | POA: Diagnosis not present

## 2015-10-27 DIAGNOSIS — R7989 Other specified abnormal findings of blood chemistry: Secondary | ICD-10-CM | POA: Diagnosis not present

## 2015-10-30 ENCOUNTER — Ambulatory Visit (INDEPENDENT_AMBULATORY_CARE_PROVIDER_SITE_OTHER): Payer: PPO | Admitting: Urology

## 2015-10-30 ENCOUNTER — Other Ambulatory Visit (HOSPITAL_COMMUNITY)
Admission: AD | Admit: 2015-10-30 | Discharge: 2015-10-30 | Disposition: A | Discharge status: Home / Self Care | Payer: PPO | Source: Other Acute Inpatient Hospital | Attending: Urology | Admitting: Urology

## 2015-10-30 DIAGNOSIS — R351 Nocturia: Secondary | ICD-10-CM | POA: Diagnosis not present

## 2015-10-30 DIAGNOSIS — R3121 Asymptomatic microscopic hematuria: Secondary | ICD-10-CM | POA: Insufficient documentation

## 2015-10-30 DIAGNOSIS — N401 Enlarged prostate with lower urinary tract symptoms: Secondary | ICD-10-CM | POA: Diagnosis not present

## 2015-10-30 DIAGNOSIS — Z8546 Personal history of malignant neoplasm of prostate: Secondary | ICD-10-CM

## 2015-10-30 DIAGNOSIS — N5201 Erectile dysfunction due to arterial insufficiency: Secondary | ICD-10-CM

## 2015-10-30 LAB — URINALYSIS, ROUTINE W REFLEX MICROSCOPIC
Bilirubin Urine: NEGATIVE
Glucose, UA: NEGATIVE mg/dL
Ketones, ur: NEGATIVE mg/dL
Leukocytes, UA: NEGATIVE
NITRITE: NEGATIVE
Protein, ur: NEGATIVE mg/dL
Specific Gravity, Urine: 1.02 (ref 1.005–1.030)
pH: 6 (ref 5.0–8.0)

## 2015-10-30 LAB — URINE MICROSCOPIC-ADD ON: WBC UA: NONE SEEN WBC/hpf (ref 0–5)

## 2015-11-10 DIAGNOSIS — E785 Hyperlipidemia, unspecified: Secondary | ICD-10-CM | POA: Diagnosis not present

## 2015-11-11 LAB — LIPID PANEL
CHOL/HDL RATIO: 5.9 ratio — AB (ref <=5.0)
Cholesterol: 200 mg/dL (ref 125–200)
HDL: 34 mg/dL — AB (ref >=40)
LDL Cholesterol: 124 mg/dL (ref <130)
Triglycerides: 208 mg/dL — ABNORMAL HIGH (ref <150)
VLDL: 42 mg/dL — ABNORMAL HIGH (ref <30)

## 2015-12-29 DIAGNOSIS — Z23 Encounter for immunization: Secondary | ICD-10-CM | POA: Diagnosis not present

## 2016-02-05 DIAGNOSIS — I10 Essential (primary) hypertension: Secondary | ICD-10-CM | POA: Diagnosis not present

## 2016-02-05 DIAGNOSIS — R7301 Impaired fasting glucose: Secondary | ICD-10-CM | POA: Diagnosis not present

## 2016-02-09 DIAGNOSIS — Z23 Encounter for immunization: Secondary | ICD-10-CM | POA: Diagnosis not present

## 2016-02-09 DIAGNOSIS — N183 Chronic kidney disease, stage 3 (moderate): Secondary | ICD-10-CM | POA: Diagnosis not present

## 2016-02-09 DIAGNOSIS — E782 Mixed hyperlipidemia: Secondary | ICD-10-CM | POA: Diagnosis not present

## 2016-02-09 DIAGNOSIS — R944 Abnormal results of kidney function studies: Secondary | ICD-10-CM | POA: Diagnosis not present

## 2016-02-09 DIAGNOSIS — Z8546 Personal history of malignant neoplasm of prostate: Secondary | ICD-10-CM | POA: Diagnosis not present

## 2016-02-09 DIAGNOSIS — I10 Essential (primary) hypertension: Secondary | ICD-10-CM | POA: Diagnosis not present

## 2016-03-02 DIAGNOSIS — H43812 Vitreous degeneration, left eye: Secondary | ICD-10-CM | POA: Diagnosis not present

## 2016-03-02 DIAGNOSIS — H40013 Open angle with borderline findings, low risk, bilateral: Secondary | ICD-10-CM | POA: Diagnosis not present

## 2016-03-02 DIAGNOSIS — Z961 Presence of intraocular lens: Secondary | ICD-10-CM | POA: Diagnosis not present

## 2016-03-02 DIAGNOSIS — H01025 Squamous blepharitis left lower eyelid: Secondary | ICD-10-CM | POA: Diagnosis not present

## 2016-03-02 DIAGNOSIS — H01022 Squamous blepharitis right lower eyelid: Secondary | ICD-10-CM | POA: Diagnosis not present

## 2016-03-02 DIAGNOSIS — H01024 Squamous blepharitis left upper eyelid: Secondary | ICD-10-CM | POA: Diagnosis not present

## 2016-03-02 DIAGNOSIS — H01021 Squamous blepharitis right upper eyelid: Secondary | ICD-10-CM | POA: Diagnosis not present

## 2016-04-08 ENCOUNTER — Ambulatory Visit (INDEPENDENT_AMBULATORY_CARE_PROVIDER_SITE_OTHER): Payer: PPO | Admitting: Urology

## 2016-04-08 DIAGNOSIS — Z8546 Personal history of malignant neoplasm of prostate: Secondary | ICD-10-CM | POA: Diagnosis not present

## 2016-04-08 DIAGNOSIS — R310 Gross hematuria: Secondary | ICD-10-CM

## 2016-04-21 DIAGNOSIS — N401 Enlarged prostate with lower urinary tract symptoms: Secondary | ICD-10-CM | POA: Diagnosis not present

## 2016-04-21 DIAGNOSIS — R3982 Chronic bladder pain: Secondary | ICD-10-CM | POA: Diagnosis not present

## 2016-04-21 DIAGNOSIS — Z6832 Body mass index (BMI) 32.0-32.9, adult: Secondary | ICD-10-CM | POA: Diagnosis not present

## 2016-04-27 DIAGNOSIS — N401 Enlarged prostate with lower urinary tract symptoms: Secondary | ICD-10-CM | POA: Diagnosis not present

## 2016-05-19 DIAGNOSIS — R7989 Other specified abnormal findings of blood chemistry: Secondary | ICD-10-CM | POA: Diagnosis not present

## 2016-05-19 DIAGNOSIS — R319 Hematuria, unspecified: Secondary | ICD-10-CM | POA: Diagnosis not present

## 2016-05-19 DIAGNOSIS — I129 Hypertensive chronic kidney disease with stage 1 through stage 4 chronic kidney disease, or unspecified chronic kidney disease: Secondary | ICD-10-CM | POA: Diagnosis not present

## 2016-05-19 DIAGNOSIS — Z6832 Body mass index (BMI) 32.0-32.9, adult: Secondary | ICD-10-CM | POA: Diagnosis not present

## 2016-05-30 ENCOUNTER — Encounter (INDEPENDENT_AMBULATORY_CARE_PROVIDER_SITE_OTHER): Payer: Self-pay | Admitting: Internal Medicine

## 2016-05-30 ENCOUNTER — Ambulatory Visit (INDEPENDENT_AMBULATORY_CARE_PROVIDER_SITE_OTHER): Payer: PPO | Admitting: Internal Medicine

## 2016-05-30 VITALS — BP 140/90 | HR 73 | Ht 72.0 in | Wt 258.4 lb

## 2016-05-30 DIAGNOSIS — K5909 Other constipation: Secondary | ICD-10-CM | POA: Diagnosis not present

## 2016-05-30 DIAGNOSIS — R1030 Lower abdominal pain, unspecified: Secondary | ICD-10-CM | POA: Diagnosis not present

## 2016-05-30 NOTE — Progress Notes (Addendum)
Subjective:    Patient ID: [name], male    DOB: October 09, 1940, 76 y.o.   MRN: [id number]  HPI Presents today with c/o lower abdominal pain since the first of March. He though he was constipated and took a laxative. He also has started on Miralax. He feels pressure and is bloated. He is having a BM daily or every 2 days.  There is no change in his stools. No melena or BRRB. He describes the pain as a gnawing pain which keeps him up at night.   His appetite is good. No weight loss.    09/11/2013 Virtual colonoscopy: Incomplete colonoscopy.     IMPRESSION: 1. This virtual colonoscopy is somewhat suboptimal due to lack of adequate distension. However no clinically significant polypoid lesion is seen and no constricting lesion is noted. 2. Very elongated and tortuous colon. 3. Lipomatosis ileocecal valve.   08/15/2013 Colonoscopy: surveillance: hx adenomas.  Impression:  Examination performed to ascending colon but cecum could not be reached. Position confirmed by applying single resolution clip to mucosa of ascending colon. Scattered diverticuli involving sigmoid and descending colon. No evidence of recurrent polyps.     03/03/2008 DG Colon with air Hi Density air:  (incomplete colonoscopy) IMPRESSION:  Scattered colonic diverticulosis.  Colon interposition between liver and diaphragm.  Redundant sigmoid colon.  No definite colonic mass or polyp identified.  01/29/2008 Colonoscopy: Dr. Gala Romney COLONOSCOPY FINDINGS: Normal rectum. Incomplete exam. Left-sided  diverticula and mid descending colon polyp, status post cold biopsy  removal. Proximal colon/cecum.  Biopsy MICROSCOPIC EXAMINATION AND DIAGNOSIS  COLON, DESCENDING, POLYP, BIOPSY: TUBULAR ADENOMA. NO HIGH GRADE DYSPLASIA OR MALIGNANCY IDENTIFIED. (ONE FRAGMENT)    10/14/2003 Colonoscopy: Colonoscopy findings: Normal rectum with scattered sigmoid diverticula,  polyp at the splenic flexure cold snared,  remaining colonic mucosa appeared  normal.  Review of Systems Past Medical History:  Diagnosis Date  . Arthritis   . Chronic back pain   . Complication of anesthesia    "hard to wake up after back surgery"  . GERD (gastroesophageal reflux disease)   . Helicobacter pylori gastritis    Treated  . Hx of adenomatous colonic polyps   . IBS (irritable bowel syndrome)   . Numbness    left leg after back surgery  . Prostate CA ([place])    seed implants  . Renal insufficiency   . Schatzki's ring    History of    Past Surgical History:  Procedure Laterality Date  . APPENDECTOMY  age 80  . BACK SURGERY  02/08   [place]  . BACK SURGERY  09/24/04   lumbar, [facility]h  . BACK SURGERY  09/18/02   [facility]  . BILIARY STENT PLACEMENT  01/07/2012   Procedure: BILIARY STENT PLACEMENT;  Surgeon: Rogene Houston, MD;  [facility];  Service: Endoscopy;  Laterality: N/A;  . BILIARY STENT PLACEMENT  02/21/2012   Procedure: BILIARY STENT PLACEMENT;  Surgeon: Rogene Houston, MD;  [facility];  Service: Endoscopy;  Laterality: N/A;  Biliary Stent Replacement  . CATARACT EXTRACTION W/PHACO  11/22/2010   Procedure: CATARACT EXTRACTION PHACO AND INTRAOCULAR LENS PLACEMENT (IOC);  Surgeon: Williams Che;  Location: [facility];  Service: Ophthalmology;  Laterality: Right;  CDE: 6.81  . CHOLECYSTECTOMY  03/04/05   [facility], [facility]. Gangrenous cholecystitis complicated by abscess requiring percutaneous drainage. He also had common duct stones requiring ERCP and sphincterotomy.  . COLONOSCOPY  August 2005   Scattered sigmoid diverticulosis, splenic flexure polyp. Hyperplastic  .  COLONOSCOPY  1997   3 cm tubular adenoma and a sending colon  . COLONOSCOPY  02/01/2011   Rourk-friable anal canal, hyperplastic rectal polyp  . COLONOSCOPY N/A 08/15/2013   Procedure: COLONOSCOPY;  Surgeon: Rogene Houston, MD;  [facility];  Service: Endoscopy;  Laterality: N/A;  100  . ERCP  03/02/05   [facility], [facility].  Normal-appearing biliary tree (gallbladder not image), status post sphincterotomy with recovery of small pieces of stone material, status post balloon occlusion cholangiogram.  . ERCP  01/07/2012   Procedure: ENDOSCOPIC RETROGRADE CHOLANGIOPANCREATOGRAPHY (ERCP);  Surgeon: Rogene Houston, MD;  [facility];  Service: Endoscopy;  Laterality: N/A;  possible biliary stenting  . ERCP  02/21/2012   Procedure: ENDOSCOPIC RETROGRADE CHOLANGIOPANCREATOGRAPHY (ERCP);  Surgeon: Rogene Houston, MD;  [facility];  Service: Endoscopy;  Laterality: N/A;  common bile duct stone and clip removed  . ERCP N/A 05/18/2012   Procedure: ENDOSCOPIC RETROGRADE CHOLANGIOPANCREATOGRAPHY (ERCP);  Surgeon: Rogene Houston, MD;  [facility];  Service: Endoscopy;  Laterality: N/A;  stone and debri removal  . ESOPHAGOGASTRODUODENOSCOPY  August 2005   Erosive esophagitis and Schatzki ring, small hiatal hernia  . ESOPHAGOGASTRODUODENOSCOPY  01/2008   Mild reflux esophagitis, small hiatal hernia  . ESOPHAGOGASTRODUODENOSCOPY  02/01/2011   Rourk-erosive reflux esophagitis, small hiatal hernia  . ESOPHAGOGASTRODUODENOSCOPY  10/26/2011   Dr. Eli Phillips gastric submucosal petechia (bx-benign ulceration), juxta ampullary duodenal diverticulum and some mucosal edema involving 1st/2nd portion of duodenum with superficial erosions (bx-superficial ulceration/benign)  . ESOPHAGOGASTRODUODENOSCOPY  02/21/2012   Procedure: ESOPHAGOGASTRODUODENOSCOPY (EGD);  Surgeon: Rogene Houston, MD;  [facility];  Service: Endoscopy;  Laterality: N/A;  . EYE SURGERY    . Incomplete colonoscopy  December 2009   Left-sided diverticula, mid descending colon polyp, due to recurrent looping and redundancy exam was incomplete. It was felt that the mid colon was reached. Followup barium enema showed colon interposition between the liver and the diaphragm, redundant sigmoid colon but no colon mass or polyp identified.. Pathology revealed  tubular adenoma.  . SPHINCTEROTOMY  01/07/2012   Procedure: SPHINCTEROTOMY;  Surgeon: Rogene Houston, MD;  [facility];  Service: Endoscopy;  Laterality: N/A;  Extended  . SPYGLASS CHOLANGIOSCOPY  02/21/2012   Procedure: SPYGLASS CHOLANGIOSCOPY;  Surgeon: Rogene Houston, MD;  [facility];  Service: Endoscopy;  Laterality: N/A;  . SPYGLASS CHOLANGIOSCOPY N/A 05/18/2012   Procedure: SPYGLASS CHOLANGIOSCOPY;  Surgeon: Rogene Houston, MD;  [facility];  Service: Endoscopy;  Laterality: N/A;  . TOTAL KNEE ARTHROPLASTY  03/21/2012   Procedure: TOTAL KNEE ARTHROPLASTY;  Surgeon: [name], MD;  Location: [facility];  Service: Orthopedics;  Laterality: Right;  right knee arthroplasty    No Known Allergies  Current Outpatient Prescriptions on File Prior to Visit  Medication Sig Dispense Refill  . amLODipine (NORVASC) 5 MG tablet Take 5 mg by mouth daily.    Marland Kitchen aspirin EC 81 MG tablet Take 1 tablet (81 mg total) by mouth daily. 90 tablet 3  . nitroGLYCERIN (NITROSTAT) 0.4 MG SL tablet Place 1 tablet (0.4 mg total) under the tongue every 5 (five) minutes as needed for chest pain. 25 tablet 2   No current facility-administered medications on file prior to visit.        Objective:   Physical Exam Blood pressure 140/90, pulse 73, height 6' (1.829 m), weight 258 lb 6.4 oz (117.2 kg). Alert and oriented. Skin warm and dry. Oral mucosa is moist.   .  Sclera anicteric, conjunctivae is pink. Thyroid not enlarged. No cervical lymphadenopathy. Lungs clear. Heart regular rate and rhythm.  Abdomen is soft. Bowel sounds are positive. No hepatomegaly. No abdominal masses felt. Mid lower  tenderness.  No edema to lower extremities.        Assessment & Plan:  Lower abdominal pain. Constipation. ? Etiology. Am going to get a CT abdomen/pelvis with CM.

## 2016-05-30 NOTE — Patient Instructions (Signed)
CT abdomen/pelvis with CM.  

## 2016-06-06 DIAGNOSIS — Z6832 Body mass index (BMI) 32.0-32.9, adult: Secondary | ICD-10-CM | POA: Diagnosis not present

## 2016-06-06 DIAGNOSIS — J060 Acute laryngopharyngitis: Secondary | ICD-10-CM | POA: Diagnosis not present

## 2016-06-09 ENCOUNTER — Ambulatory Visit (HOSPITAL_COMMUNITY)
Admission: RE | Admit: 2016-06-09 | Discharge: 2016-06-09 | Disposition: A | Discharge status: Home / Self Care | Payer: PPO | Source: Ambulatory Visit | Attending: Internal Medicine | Admitting: Internal Medicine

## 2016-06-09 ENCOUNTER — Encounter (HOSPITAL_COMMUNITY): Payer: Self-pay

## 2016-06-09 DIAGNOSIS — K5730 Diverticulosis of large intestine without perforation or abscess without bleeding: Secondary | ICD-10-CM | POA: Insufficient documentation

## 2016-06-09 DIAGNOSIS — I700 Atherosclerosis of aorta: Secondary | ICD-10-CM | POA: Insufficient documentation

## 2016-06-09 DIAGNOSIS — R109 Unspecified abdominal pain: Secondary | ICD-10-CM | POA: Diagnosis not present

## 2016-06-09 DIAGNOSIS — R1030 Lower abdominal pain, unspecified: Secondary | ICD-10-CM

## 2016-06-09 DIAGNOSIS — I2510 Atherosclerotic heart disease of native coronary artery without angina pectoris: Secondary | ICD-10-CM | POA: Diagnosis not present

## 2016-06-09 DIAGNOSIS — K5909 Other constipation: Secondary | ICD-10-CM

## 2016-06-09 LAB — POCT I-STAT CREATININE: Creatinine, Ser: 2.1 mg/dL — ABNORMAL HIGH (ref 0.61–1.24)

## 2016-06-09 MED ORDER — IOPAMIDOL (ISOVUE-300) INJECTION 61%
80.0000 mL | Freq: Once | INTRAVENOUS | Status: AC | PRN
  Administered 2016-06-09: 80 mL via INTRAVENOUS

## 2016-06-10 ENCOUNTER — Ambulatory Visit (INDEPENDENT_AMBULATORY_CARE_PROVIDER_SITE_OTHER): Payer: PPO | Admitting: Urology

## 2016-06-10 DIAGNOSIS — N401 Enlarged prostate with lower urinary tract symptoms: Secondary | ICD-10-CM

## 2016-06-10 DIAGNOSIS — N3041 Irradiation cystitis with hematuria: Secondary | ICD-10-CM | POA: Diagnosis not present

## 2016-06-10 DIAGNOSIS — R310 Gross hematuria: Secondary | ICD-10-CM | POA: Diagnosis not present

## 2016-06-10 DIAGNOSIS — Z8546 Personal history of malignant neoplasm of prostate: Secondary | ICD-10-CM | POA: Diagnosis not present

## 2016-07-04 ENCOUNTER — Ambulatory Visit (INDEPENDENT_AMBULATORY_CARE_PROVIDER_SITE_OTHER): Payer: PPO | Admitting: Otolaryngology

## 2016-07-04 DIAGNOSIS — D3709 Neoplasm of uncertain behavior of other specified sites of the oral cavity: Secondary | ICD-10-CM | POA: Diagnosis not present

## 2016-08-30 DIAGNOSIS — M2040 Other hammer toe(s) (acquired), unspecified foot: Secondary | ICD-10-CM | POA: Diagnosis not present

## 2016-08-30 DIAGNOSIS — L851 Acquired keratosis [keratoderma] palmaris et plantaris: Secondary | ICD-10-CM | POA: Diagnosis not present

## 2016-08-30 DIAGNOSIS — M7742 Metatarsalgia, left foot: Secondary | ICD-10-CM | POA: Diagnosis not present

## 2016-08-30 DIAGNOSIS — M2010 Hallux valgus (acquired), unspecified foot: Secondary | ICD-10-CM | POA: Diagnosis not present

## 2016-09-01 DIAGNOSIS — M1711 Unilateral primary osteoarthritis, right knee: Secondary | ICD-10-CM | POA: Diagnosis not present

## 2016-09-01 DIAGNOSIS — M1712 Unilateral primary osteoarthritis, left knee: Secondary | ICD-10-CM | POA: Diagnosis not present

## 2016-09-15 DIAGNOSIS — M1711 Unilateral primary osteoarthritis, right knee: Secondary | ICD-10-CM | POA: Diagnosis not present

## 2016-09-22 DIAGNOSIS — M1712 Unilateral primary osteoarthritis, left knee: Secondary | ICD-10-CM | POA: Diagnosis not present

## 2016-09-29 DIAGNOSIS — M1712 Unilateral primary osteoarthritis, left knee: Secondary | ICD-10-CM | POA: Diagnosis not present

## 2016-10-10 DIAGNOSIS — Z8546 Personal history of malignant neoplasm of prostate: Secondary | ICD-10-CM | POA: Diagnosis not present

## 2016-10-14 ENCOUNTER — Ambulatory Visit (INDEPENDENT_AMBULATORY_CARE_PROVIDER_SITE_OTHER): Payer: PPO | Admitting: Urology

## 2016-10-14 DIAGNOSIS — Z8546 Personal history of malignant neoplasm of prostate: Secondary | ICD-10-CM

## 2016-10-14 DIAGNOSIS — N401 Enlarged prostate with lower urinary tract symptoms: Secondary | ICD-10-CM | POA: Diagnosis not present

## 2016-10-14 DIAGNOSIS — R310 Gross hematuria: Secondary | ICD-10-CM | POA: Diagnosis not present

## 2016-10-14 DIAGNOSIS — R351 Nocturia: Secondary | ICD-10-CM | POA: Diagnosis not present

## 2016-11-15 DIAGNOSIS — M21121 Varus deformity, not elsewhere classified, right elbow: Secondary | ICD-10-CM | POA: Diagnosis not present

## 2016-11-15 DIAGNOSIS — L851 Acquired keratosis [keratoderma] palmaris et plantaris: Secondary | ICD-10-CM | POA: Diagnosis not present

## 2016-11-15 DIAGNOSIS — M7740 Metatarsalgia, unspecified foot: Secondary | ICD-10-CM | POA: Diagnosis not present

## 2016-11-15 DIAGNOSIS — M2042 Other hammer toe(s) (acquired), left foot: Secondary | ICD-10-CM | POA: Diagnosis not present

## 2016-12-30 DIAGNOSIS — Z23 Encounter for immunization: Secondary | ICD-10-CM | POA: Diagnosis not present

## 2017-01-05 DIAGNOSIS — I10 Essential (primary) hypertension: Secondary | ICD-10-CM | POA: Diagnosis not present

## 2017-01-05 DIAGNOSIS — R7301 Impaired fasting glucose: Secondary | ICD-10-CM | POA: Diagnosis not present

## 2017-01-09 ENCOUNTER — Ambulatory Visit (INDEPENDENT_AMBULATORY_CARE_PROVIDER_SITE_OTHER): Payer: PPO | Admitting: Otolaryngology

## 2017-01-09 DIAGNOSIS — K120 Recurrent oral aphthae: Secondary | ICD-10-CM | POA: Diagnosis not present

## 2017-01-10 DIAGNOSIS — Z923 Personal history of irradiation: Secondary | ICD-10-CM | POA: Diagnosis not present

## 2017-01-10 DIAGNOSIS — E6609 Other obesity due to excess calories: Secondary | ICD-10-CM | POA: Diagnosis not present

## 2017-01-10 DIAGNOSIS — R7301 Impaired fasting glucose: Secondary | ICD-10-CM | POA: Diagnosis not present

## 2017-01-10 DIAGNOSIS — E785 Hyperlipidemia, unspecified: Secondary | ICD-10-CM | POA: Diagnosis not present

## 2017-01-10 DIAGNOSIS — Z0000 Encounter for general adult medical examination without abnormal findings: Secondary | ICD-10-CM | POA: Diagnosis not present

## 2017-01-10 DIAGNOSIS — I129 Hypertensive chronic kidney disease with stage 1 through stage 4 chronic kidney disease, or unspecified chronic kidney disease: Secondary | ICD-10-CM | POA: Diagnosis not present

## 2017-01-10 DIAGNOSIS — N183 Chronic kidney disease, stage 3 (moderate): Secondary | ICD-10-CM | POA: Diagnosis not present

## 2017-01-10 DIAGNOSIS — Z8546 Personal history of malignant neoplasm of prostate: Secondary | ICD-10-CM | POA: Diagnosis not present

## 2017-01-23 ENCOUNTER — Ambulatory Visit (INDEPENDENT_AMBULATORY_CARE_PROVIDER_SITE_OTHER): Payer: PPO | Admitting: Otolaryngology

## 2017-01-23 DIAGNOSIS — K120 Recurrent oral aphthae: Secondary | ICD-10-CM

## 2017-02-10 DIAGNOSIS — Z23 Encounter for immunization: Secondary | ICD-10-CM | POA: Diagnosis not present

## 2017-02-24 DIAGNOSIS — R319 Hematuria, unspecified: Secondary | ICD-10-CM | POA: Diagnosis not present

## 2017-02-24 DIAGNOSIS — N400 Enlarged prostate without lower urinary tract symptoms: Secondary | ICD-10-CM | POA: Diagnosis not present

## 2017-02-24 DIAGNOSIS — Z8546 Personal history of malignant neoplasm of prostate: Secondary | ICD-10-CM | POA: Diagnosis not present

## 2017-02-24 DIAGNOSIS — R1030 Lower abdominal pain, unspecified: Secondary | ICD-10-CM | POA: Diagnosis not present

## 2017-04-28 ENCOUNTER — Ambulatory Visit: Payer: PPO | Admitting: Urology

## 2017-05-03 DIAGNOSIS — Z6832 Body mass index (BMI) 32.0-32.9, adult: Secondary | ICD-10-CM | POA: Diagnosis not present

## 2017-05-03 DIAGNOSIS — R002 Palpitations: Secondary | ICD-10-CM | POA: Diagnosis not present

## 2017-05-05 ENCOUNTER — Encounter: Payer: Self-pay | Admitting: Cardiovascular Disease

## 2017-05-05 ENCOUNTER — Ambulatory Visit: Payer: PPO | Admitting: Cardiovascular Disease

## 2017-05-05 VITALS — BP 169/76 | HR 67 | Ht 74.0 in | Wt 255.8 lb

## 2017-05-05 DIAGNOSIS — R5383 Other fatigue: Secondary | ICD-10-CM

## 2017-05-05 DIAGNOSIS — E785 Hyperlipidemia, unspecified: Secondary | ICD-10-CM | POA: Diagnosis not present

## 2017-05-05 DIAGNOSIS — R002 Palpitations: Secondary | ICD-10-CM | POA: Diagnosis not present

## 2017-05-05 DIAGNOSIS — I493 Ventricular premature depolarization: Secondary | ICD-10-CM | POA: Diagnosis not present

## 2017-05-05 DIAGNOSIS — I10 Essential (primary) hypertension: Secondary | ICD-10-CM | POA: Diagnosis not present

## 2017-05-05 DIAGNOSIS — I25118 Atherosclerotic heart disease of native coronary artery with other forms of angina pectoris: Secondary | ICD-10-CM

## 2017-05-05 MED ORDER — ROSUVASTATIN CALCIUM 20 MG PO TABS: 20.0000 mg | ORAL_TABLET | Freq: Every day | ORAL | 6 refills | Status: DC

## 2017-05-05 MED ORDER — METOPROLOL TARTRATE 25 MG PO TABS: 25.0000 mg | ORAL_TABLET | Freq: Two times a day (BID) | ORAL | 6 refills | Status: DC

## 2017-05-05 NOTE — Patient Instructions (Signed)
Medication Instructions:   Stop Pravastatin.  Begin Crestor 20mg  daily.  Begin Lopressor 25mg  twice a day.  Continue all other medications.    Labwork: none  Testing/Procedures: none  Follow-Up: 2 months   Any Other Special Instructions Will Be Listed Below (If Applicable). You have been referred to:  [facility]  If you need a refill on your cardiac medications before your next appointment, please call your pharmacy.

## 2017-05-05 NOTE — Addendum Note (Signed)
Addended by: Laurine Blazer on: 05/05/2017 11:41 AM   Modules accepted: Orders

## 2017-05-05 NOTE — Progress Notes (Signed)
SUBJECTIVE: [name] is a 77 year old male who I am meeting for the first time.  He was evaluated for chest pain in 2017 by Dr. Harrington Challenger.  He reportedly has a history of CVA, MRSA, and renal failure.  Most recent nuclear stress test on 09/16/15 showed prior inferior myocardial infarction with moderate peri-infarct ischemia.  I reviewed notes from his PCP.  He had been complaining of palpitations.  He tells me palpitations have been occurring more frequently for the past 1 month.  He seldom has sharp pains in his chest lasting seconds associated with this.  He does have associated shortness of breath.  He does not smoke.  There is no associated lightheadedness, dizziness, or syncope.  He denies leg swelling, orthopnea, and paroxysmal nocturnal dyspnea.  He said he feels markedly fatigued even after getting a good night's rest.  He does not know if he snores or if he has sleep apnea.  I personally reviewed an ECG performed at his PCPs office which demonstrated sinus rhythm with right bundle branch block, left axis deviation, first-degree AV block with PR interval of 230 ms, and frequent PVCs.  I reviewed labs performed on 01/05/17: Hemoglobin 17, platelets 162, BUN 24, creatinine 1.78, normal liver transaminases, total cholesterol 185, triglycerides 187, HDL 34, LDL 114, HbA1c 5.6%.        Review of Systems: As per "subjective", otherwise negative.  No Known Allergies  Current Outpatient Medications  Medication Sig Dispense Refill  . amLODipine (NORVASC) 5 MG tablet Take 5 mg by mouth daily.    Marland Kitchen aspirin EC 81 MG tablet Take 1 tablet (81 mg total) by mouth daily. 90 tablet 3  . baclofen (LIORESAL) 10 MG tablet 10 mg 2 (two) times daily as needed.     . nitroGLYCERIN (NITROSTAT) 0.4 MG SL tablet Place 1 tablet (0.4 mg total) under the tongue every 5 (five) minutes as needed for chest pain. 25 tablet 2  . polyethylene glycol (MIRALAX / GLYCOLAX) packet Take by mouth daily.    .  pravastatin (PRAVACHOL) 10 MG tablet Take 10 mg by mouth daily.      No current facility-administered medications for this visit.     Past Medical History:  Diagnosis Date  . Arthritis   . Chronic back pain   . Complication of anesthesia    "hard to wake up after back surgery"  . GERD (gastroesophageal reflux disease)   . Helicobacter pylori gastritis    Treated  . Hx of adenomatous colonic polyps   . IBS (irritable bowel syndrome)   . Numbness    left leg after back surgery  . Prostate CA ([place])    seed implants  . Renal insufficiency   . Schatzki's ring    History of    Past Surgical History:  Procedure Laterality Date  . APPENDECTOMY  age 14  . BACK SURGERY  02/08   [place]  . BACK SURGERY  09/24/04   lumbar, m[facility]h  . BACK SURGERY  09/18/02   [place]  . BILIARY STENT PLACEMENT  01/07/2012   Procedure: BILIARY STENT PLACEMENT;  Surgeon: Rogene Houston, MD;  [facility]: [facility];  Service: Endoscopy;  Laterality: N/A;  . BILIARY STENT PLACEMENT  02/21/2012   Procedure: BILIARY STENT PLACEMENT;  Surgeon: Rogene Houston, MD;  [facility]: [facility];  Service: Endoscopy;  Laterality: N/A;  Biliary Stent Replacement  . CATARACT EXTRACTION W/PHACO  11/22/2010   Procedure: CATARACT EXTRACTION PHACO AND INTRAOCULAR LENS PLACEMENT (  [place]);  Surgeon: Williams Che;  Location: [facility];  Service: Ophthalmology;  Laterality: Right;  CDE: 6.81  . CHOLECYSTECTOMY  03/04/05   [facility], [facility]. Gangrenous cholecystitis complicated by abscess requiring percutaneous drainage. He also had common duct stones requiring ERCP and sphincterotomy.  . COLONOSCOPY  August 2005   Scattered sigmoid diverticulosis, splenic flexure polyp. Hyperplastic  . COLONOSCOPY  1997   3 cm tubular adenoma and a sending colon  . COLONOSCOPY  02/01/2011   Rourk-friable anal canal, hyperplastic rectal polyp  . COLONOSCOPY N/A 08/15/2013   Procedure: COLONOSCOPY;  Surgeon: Rogene Houston, MD;  [facility];  Service:  Endoscopy;  Laterality: N/A;  100  . ERCP  03/02/05   [facility], R[facility]. Normal-appearing biliary tree (gallbladder not image), status post sphincterotomy with recovery of small pieces of stone material, status post balloon occlusion cholangiogram.  . ERCP  01/07/2012   Procedure: ENDOSCOPIC RETROGRADE CHOLANGIOPANCREATOGRAPHY (ERCP);  Surgeon: Rogene Houston, MD;  [facility]: [facility];  Service: Endoscopy;  Laterality: N/A;  possible biliary stenting  . ERCP  02/21/2012   Procedure: ENDOSCOPIC RETROGRADE CHOLANGIOPANCREATOGRAPHY (ERCP);  Surgeon: Rogene Houston, MD;  [facility];  Service: Endoscopy;  Laterality: N/A;  common bile duct stone and clip removed  . ERCP N/A 05/18/2012   Procedure: ENDOSCOPIC RETROGRADE CHOLANGIOPANCREATOGRAPHY (ERCP);  Surgeon: Rogene Houston, MD;  Location: [facility];  Service: Endoscopy;  Laterality: N/A;  stone and debri removal  . ESOPHAGOGASTRODUODENOSCOPY  August 2005   Erosive esophagitis and Schatzki ring, small hiatal hernia  . ESOPHAGOGASTRODUODENOSCOPY  01/2008   Mild reflux esophagitis, small hiatal hernia  . ESOPHAGOGASTRODUODENOSCOPY  02/01/2011   Rourk-erosive reflux esophagitis, small hiatal hernia  . ESOPHAGOGASTRODUODENOSCOPY  10/26/2011   Dr. Eli Phillips gastric submucosal petechia (bx-benign ulceration), juxta ampullary duodenal diverticulum and some mucosal edema involving 1st/2nd portion of duodenum with superficial erosions (bx-superficial ulceration/benign)  . ESOPHAGOGASTRODUODENOSCOPY  02/21/2012   Procedure: ESOPHAGOGASTRODUODENOSCOPY (EGD);  Surgeon: Rogene Houston, MD;  [facility];  Service: Endoscopy;  Laterality: N/A;  . EYE SURGERY    . Incomplete colonoscopy  December 2009   Left-sided diverticula, mid descending colon polyp, due to recurrent looping and redundancy exam was incomplete. It was felt that the mid colon was reached. Followup barium enema showed colon interposition between the liver and the diaphragm, redundant sigmoid  colon but no colon mass or polyp identified.. Pathology revealed tubular adenoma.  . SPHINCTEROTOMY  01/07/2012   Procedure: SPHINCTEROTOMY;  Surgeon: Rogene Houston, MD;  [facility];  Service: Endoscopy;  Laterality: N/A;  Extended  . SPYGLASS CHOLANGIOSCOPY  02/21/2012   Procedure: SPYGLASS CHOLANGIOSCOPY;  Surgeon: Rogene Houston, MD;  [facility];  Service: Endoscopy;  Laterality: N/A;  . SPYGLASS CHOLANGIOSCOPY N/A 05/18/2012   Procedure: SPYGLASS CHOLANGIOSCOPY;  Surgeon: Rogene Houston, MD;  [facility];  Service: Endoscopy;  Laterality: N/A;  . TOTAL KNEE ARTHROPLASTY  03/21/2012   Procedure: TOTAL KNEE ARTHROPLASTY;  Surgeon: [name], MD;  Location: [place];  Service: Orthopedics;  Laterality: Right;  right knee arthroplasty    Social History   Socioeconomic History  . Marital status: Married    Spouse name: Not on file  . Number of children: 2  . Years of education: Not on file  . Highest education level: Not on file  Social Needs  . Financial resource strain: Not on file  . Food insecurity - worry: Not on file  . Food insecurity - inability:  Not on file  . Transportation needs - medical: Not on file  . Transportation needs - non-medical: Not on file  Occupational History  . Occupation: Retired Chemical engineer: RETIRED  Tobacco Use  . Smoking status: Never Smoker  . Smokeless tobacco: Never Used  Substance and Sexual Activity  . Alcohol use: No    Alcohol/week: 0.0 oz  . Drug use: No  . Sexual activity: Yes    Birth control/protection: None  Other Topics Concern  . Not on file  Social History Narrative  . Not on file     Vitals:   05/05/17 1055  BP: (!) 169/76  Pulse: 67  Weight: 255 lb 12.8 oz (116 kg)  Height: 6\' 2"  (1.88 m)    Wt Readings from Last 3 Encounters:  05/05/17 255 lb 12.8 oz (116 kg)  05/30/16 258 lb 6.4 oz (117.2 kg)  10/23/15 249 lb (112.9 kg)     PHYSICAL EXAM General: NAD HEENT: Normal. Neck: No  JVD, no thyromegaly. Lungs: Clear to auscultation bilaterally with normal respiratory effort. CV: Regular rate and rhythm, normal S1/S2, no S3/S4, no murmur. No pretibial or periankle edema.  No carotid bruit.   Abdomen: Soft, nontender, no distention.  Neurologic: Alert and oriented.  Psych: Normal affect. Skin: Normal. Musculoskeletal: No gross deformities.    ECG: Most recent ECG reviewed.   Labs: Lab Results  Component Value Date/Time   K 4.7 08/11/2014 09:54 AM   BUN 34 (H) 08/11/2014 09:54 AM   CREATININE 2.10 (H) 06/09/2016 03:05 PM   CREATININE 1.34 07/23/2013 11:17 AM   ALT 30 07/23/2013 11:17 AM   TSH 2.862 01/19/2011 09:37 AM   HGB 12.6 (L) 08/11/2014 09:54 AM     Lipids: Lab Results  Component Value Date/Time   LDLCALC 124 11/10/2015 07:34 AM   CHOL 200 11/10/2015 07:34 AM   TRIG 208 (H) 11/10/2015 07:34 AM   HDL 34 (L) 11/10/2015 07:34 AM       ASSESSMENT AND PLAN: 1.  Palpitations with frequent PVCs: ECG reviewed above with frequent PVCs.  He has presumed coronary artery disease.  I will start metoprolol 25 mg twice daily.  Given his marked fatigue even after a good night's rest, he may have sleep apnea which is a known etiology of cardiac arrhythmias.  I will obtain a sleep study.  2.  Presumed coronary artery disease: Nuclear stress test reviewed above.  Symptomatically stable with no significant exertional chest pain or dyspnea.  Continue aspirin and pravastatin.  I am starting metoprolol 25 mg twice daily for palpitations.  3.  Hypertension: Blood pressure is elevated.  I will monitor given the addition of metoprolol.  He may also have sleep apnea as noted above.  I am obtaining a sleep study.  4.  Hypercholesterolemia: Currently on pravastatin 10 mg.  Lipids reviewed above.  I will switch to a more potent statin, Crestor 20 mg.  5.  Fatigue: It is possible he has sleep disordered breathing.  If he has sleep apnea, this would explain uncontrolled  hypertension as well as palpitations with arrhythmias.  I will obtain a sleep study.   Disposition: Follow up 2 months  Time spent: 40 minutes, of which greater than 50% was spent reviewing symptoms, relevant blood tests and studies, and discussing management plan with the patient.    Kate Sable, M.D., F.A.C.C.

## 2017-06-27 DIAGNOSIS — H04129 Dry eye syndrome of unspecified lacrimal gland: Secondary | ICD-10-CM | POA: Diagnosis not present

## 2017-06-27 DIAGNOSIS — H8149 Vertigo of central origin, unspecified ear: Secondary | ICD-10-CM | POA: Diagnosis not present

## 2017-06-27 DIAGNOSIS — Z6833 Body mass index (BMI) 33.0-33.9, adult: Secondary | ICD-10-CM | POA: Diagnosis not present

## 2017-06-27 DIAGNOSIS — R42 Dizziness and giddiness: Secondary | ICD-10-CM | POA: Diagnosis not present

## 2017-06-27 DIAGNOSIS — M542 Cervicalgia: Secondary | ICD-10-CM | POA: Diagnosis not present

## 2017-07-05 DIAGNOSIS — I10 Essential (primary) hypertension: Secondary | ICD-10-CM | POA: Diagnosis not present

## 2017-07-05 DIAGNOSIS — R7301 Impaired fasting glucose: Secondary | ICD-10-CM | POA: Diagnosis not present

## 2017-07-06 ENCOUNTER — Ambulatory Visit: Payer: PPO | Admitting: Cardiovascular Disease

## 2017-07-07 ENCOUNTER — Other Ambulatory Visit (HOSPITAL_COMMUNITY): Payer: Self-pay | Admitting: Adult Health Nurse Practitioner

## 2017-07-07 ENCOUNTER — Ambulatory Visit (HOSPITAL_COMMUNITY)
Admission: RE | Admit: 2017-07-07 | Discharge: 2017-07-07 | Disposition: A | Discharge status: Home / Self Care | Payer: PPO | Source: Ambulatory Visit | Attending: Adult Health Nurse Practitioner | Admitting: Adult Health Nurse Practitioner

## 2017-07-07 DIAGNOSIS — M542 Cervicalgia: Secondary | ICD-10-CM | POA: Diagnosis not present

## 2017-07-07 DIAGNOSIS — Z8546 Personal history of malignant neoplasm of prostate: Secondary | ICD-10-CM | POA: Diagnosis not present

## 2017-07-07 DIAGNOSIS — M47812 Spondylosis without myelopathy or radiculopathy, cervical region: Secondary | ICD-10-CM | POA: Insufficient documentation

## 2017-07-07 DIAGNOSIS — Z6832 Body mass index (BMI) 32.0-32.9, adult: Secondary | ICD-10-CM | POA: Diagnosis not present

## 2017-07-07 DIAGNOSIS — I129 Hypertensive chronic kidney disease with stage 1 through stage 4 chronic kidney disease, or unspecified chronic kidney disease: Secondary | ICD-10-CM | POA: Diagnosis not present

## 2017-07-07 DIAGNOSIS — R002 Palpitations: Secondary | ICD-10-CM | POA: Diagnosis not present

## 2017-07-07 DIAGNOSIS — R51 Headache: Secondary | ICD-10-CM | POA: Insufficient documentation

## 2017-07-07 DIAGNOSIS — R7301 Impaired fasting glucose: Secondary | ICD-10-CM | POA: Diagnosis not present

## 2017-07-07 DIAGNOSIS — E782 Mixed hyperlipidemia: Secondary | ICD-10-CM | POA: Diagnosis not present

## 2017-07-07 DIAGNOSIS — E6609 Other obesity due to excess calories: Secondary | ICD-10-CM | POA: Diagnosis not present

## 2017-07-07 DIAGNOSIS — N183 Chronic kidney disease, stage 3 (moderate): Secondary | ICD-10-CM | POA: Diagnosis not present

## 2017-07-07 DIAGNOSIS — I2510 Atherosclerotic heart disease of native coronary artery without angina pectoris: Secondary | ICD-10-CM | POA: Diagnosis not present

## 2017-07-14 DIAGNOSIS — M542 Cervicalgia: Secondary | ICD-10-CM | POA: Diagnosis not present

## 2017-07-18 DIAGNOSIS — M542 Cervicalgia: Secondary | ICD-10-CM | POA: Diagnosis not present

## 2017-07-26 DIAGNOSIS — M47812 Spondylosis without myelopathy or radiculopathy, cervical region: Secondary | ICD-10-CM | POA: Diagnosis not present

## 2017-07-26 DIAGNOSIS — M542 Cervicalgia: Secondary | ICD-10-CM | POA: Diagnosis not present

## 2017-07-26 DIAGNOSIS — M5481 Occipital neuralgia: Secondary | ICD-10-CM | POA: Diagnosis not present

## 2017-08-14 DIAGNOSIS — R319 Hematuria, unspecified: Secondary | ICD-10-CM | POA: Diagnosis not present

## 2017-08-14 DIAGNOSIS — Z6832 Body mass index (BMI) 32.0-32.9, adult: Secondary | ICD-10-CM | POA: Diagnosis not present

## 2017-08-14 DIAGNOSIS — R42 Dizziness and giddiness: Secondary | ICD-10-CM | POA: Diagnosis not present

## 2017-08-14 DIAGNOSIS — R079 Chest pain, unspecified: Secondary | ICD-10-CM | POA: Diagnosis not present

## 2017-08-14 DIAGNOSIS — R7301 Impaired fasting glucose: Secondary | ICD-10-CM | POA: Diagnosis not present

## 2017-08-14 DIAGNOSIS — J060 Acute laryngopharyngitis: Secondary | ICD-10-CM | POA: Diagnosis not present

## 2017-08-14 DIAGNOSIS — Z8546 Personal history of malignant neoplasm of prostate: Secondary | ICD-10-CM | POA: Diagnosis not present

## 2017-08-14 DIAGNOSIS — M542 Cervicalgia: Secondary | ICD-10-CM | POA: Diagnosis not present

## 2017-08-14 DIAGNOSIS — I10 Essential (primary) hypertension: Secondary | ICD-10-CM | POA: Diagnosis not present

## 2017-08-14 DIAGNOSIS — R944 Abnormal results of kidney function studies: Secondary | ICD-10-CM | POA: Diagnosis not present

## 2017-08-14 DIAGNOSIS — N183 Chronic kidney disease, stage 3 (moderate): Secondary | ICD-10-CM | POA: Diagnosis not present

## 2017-08-14 DIAGNOSIS — N400 Enlarged prostate without lower urinary tract symptoms: Secondary | ICD-10-CM | POA: Diagnosis not present

## 2017-08-16 DIAGNOSIS — R319 Hematuria, unspecified: Secondary | ICD-10-CM | POA: Diagnosis not present

## 2017-08-16 DIAGNOSIS — Z6832 Body mass index (BMI) 32.0-32.9, adult: Secondary | ICD-10-CM | POA: Diagnosis not present

## 2017-08-16 DIAGNOSIS — R7989 Other specified abnormal findings of blood chemistry: Secondary | ICD-10-CM | POA: Diagnosis not present

## 2017-08-16 DIAGNOSIS — I129 Hypertensive chronic kidney disease with stage 1 through stage 4 chronic kidney disease, or unspecified chronic kidney disease: Secondary | ICD-10-CM | POA: Diagnosis not present

## 2017-08-18 DIAGNOSIS — R944 Abnormal results of kidney function studies: Secondary | ICD-10-CM | POA: Diagnosis not present

## 2017-08-18 DIAGNOSIS — Z6832 Body mass index (BMI) 32.0-32.9, adult: Secondary | ICD-10-CM | POA: Diagnosis not present

## 2017-08-18 DIAGNOSIS — M542 Cervicalgia: Secondary | ICD-10-CM | POA: Diagnosis not present

## 2017-08-18 DIAGNOSIS — N183 Chronic kidney disease, stage 3 (moderate): Secondary | ICD-10-CM | POA: Diagnosis not present

## 2017-08-18 DIAGNOSIS — R079 Chest pain, unspecified: Secondary | ICD-10-CM | POA: Diagnosis not present

## 2017-08-18 DIAGNOSIS — N400 Enlarged prostate without lower urinary tract symptoms: Secondary | ICD-10-CM | POA: Diagnosis not present

## 2017-08-18 DIAGNOSIS — J060 Acute laryngopharyngitis: Secondary | ICD-10-CM | POA: Diagnosis not present

## 2017-08-18 DIAGNOSIS — R42 Dizziness and giddiness: Secondary | ICD-10-CM | POA: Diagnosis not present

## 2017-08-18 DIAGNOSIS — I10 Essential (primary) hypertension: Secondary | ICD-10-CM | POA: Diagnosis not present

## 2017-08-18 DIAGNOSIS — R7301 Impaired fasting glucose: Secondary | ICD-10-CM | POA: Diagnosis not present

## 2017-08-18 DIAGNOSIS — Z8546 Personal history of malignant neoplasm of prostate: Secondary | ICD-10-CM | POA: Diagnosis not present

## 2017-08-18 DIAGNOSIS — R319 Hematuria, unspecified: Secondary | ICD-10-CM | POA: Diagnosis not present

## 2017-08-30 ENCOUNTER — Ambulatory Visit: Payer: PPO | Admitting: Cardiovascular Disease

## 2017-08-30 ENCOUNTER — Telehealth: Payer: Self-pay | Admitting: Cardiovascular Disease

## 2017-08-30 ENCOUNTER — Encounter: Payer: Self-pay | Admitting: Cardiovascular Disease

## 2017-08-30 ENCOUNTER — Encounter: Payer: Self-pay | Admitting: *Deleted

## 2017-08-30 VITALS — BP 120/80 | HR 83 | Ht 74.0 in | Wt 254.0 lb

## 2017-08-30 DIAGNOSIS — I493 Ventricular premature depolarization: Secondary | ICD-10-CM

## 2017-08-30 DIAGNOSIS — R0609 Other forms of dyspnea: Secondary | ICD-10-CM

## 2017-08-30 DIAGNOSIS — R002 Palpitations: Secondary | ICD-10-CM

## 2017-08-30 DIAGNOSIS — I10 Essential (primary) hypertension: Secondary | ICD-10-CM | POA: Diagnosis not present

## 2017-08-30 DIAGNOSIS — E785 Hyperlipidemia, unspecified: Secondary | ICD-10-CM

## 2017-08-30 DIAGNOSIS — R5383 Other fatigue: Secondary | ICD-10-CM

## 2017-08-30 DIAGNOSIS — I25118 Atherosclerotic heart disease of native coronary artery with other forms of angina pectoris: Secondary | ICD-10-CM | POA: Diagnosis not present

## 2017-08-30 NOTE — Progress Notes (Signed)
SUBJECTIVE: The patient presents for routine follow-up.  He has a presumed history of coronary artery disease based on nuclear stress testing on 09/16/2015 which showed prior inferior myocardial infarction with moderate peri-infarct ischemia.  He also has a history of CVA, MRSA, and renal failure.  I ordered a sleep study at his last visit but he did not pursue it.  He denies chest pain but he has exertional dyspnea which has not improved.  He is not able to do activities that he was able to do a year ago.  He has occasional palpitations.  He denies leg swelling.  He asked if it is important to take metoprolol.      Review of Systems: As per "subjective", otherwise negative.  No Known Allergies  Current Outpatient Medications  Medication Sig Dispense Refill  . amLODipine (NORVASC) 5 MG tablet Take 5 mg by mouth daily.    Marland Kitchen aspirin EC 81 MG tablet Take 1 tablet (81 mg total) by mouth daily. 90 tablet 3  . baclofen (LIORESAL) 10 MG tablet 10 mg 2 (two) times daily as needed.     . metoprolol tartrate (LOPRESSOR) 25 MG tablet Take 1 tablet (25 mg total) by mouth 2 (two) times daily. 60 tablet 6  . nitroGLYCERIN (NITROSTAT) 0.4 MG SL tablet Place 1 tablet (0.4 mg total) under the tongue every 5 (five) minutes as needed for chest pain. 25 tablet 2  . polyethylene glycol (MIRALAX / GLYCOLAX) packet Take by mouth daily.    . rosuvastatin (CRESTOR) 20 MG tablet Take 1 tablet (20 mg total) by mouth daily. 30 tablet 6   No current facility-administered medications for this visit.     Past Medical History:  Diagnosis Date  . Arthritis   . Chronic back pain   . Complication of anesthesia    "hard to wake up after back surgery"  . GERD (gastroesophageal reflux disease)   . Helicobacter pylori gastritis    Treated  . Hx of adenomatous colonic polyps   . IBS (irritable bowel syndrome)   . Numbness    left leg after back surgery  . Prostate CA ([place])    seed implants  . Renal  insufficiency   . Schatzki's ring    History of    Past Surgical History:  Procedure Laterality Date  . APPENDECTOMY  age 85  . BACK SURGERY  02/08   [place]  . BACK SURGERY  09/24/04   lumbar, [facility]h  . BACK SURGERY  09/18/02   [place]  . BILIARY STENT PLACEMENT  01/07/2012   Procedure: BILIARY STENT PLACEMENT;  Surgeon: Rogene Houston, MD;  [facility];  Service: Endoscopy;  Laterality: N/A;  . BILIARY STENT PLACEMENT  02/21/2012   Procedure: BILIARY STENT PLACEMENT;  Surgeon: Rogene Houston, MD;  [facility];  Service: Endoscopy;  Laterality: N/A;  Biliary Stent Replacement  . CATARACT EXTRACTION W/PHACO  11/22/2010   Procedure: CATARACT EXTRACTION PHACO AND INTRAOCULAR LENS PLACEMENT ([facility]);  Surgeon: Williams Che;  Location: [facility];  Service: Ophthalmology;  Laterality: Right;  CDE: 6.81  . CHOLECYSTECTOMY  03/04/05   [facility], [facility]. Gangrenous cholecystitis complicated by abscess requiring percutaneous drainage. He also had common duct stones requiring ERCP and sphincterotomy.  . COLONOSCOPY  August 2005   Scattered sigmoid diverticulosis, splenic flexure polyp. Hyperplastic  . COLONOSCOPY  1997   3 cm tubular adenoma and a sending colon  . COLONOSCOPY  02/01/2011   Rourk-friable anal canal,  hyperplastic rectal polyp  . COLONOSCOPY N/A 08/15/2013   Procedure: COLONOSCOPY;  Surgeon: Rogene Houston, MD;  [facility];  Service: Endoscopy;  Laterality: N/A;  100  . ERCP  03/02/05   [facility], R[facility]. Normal-appearing biliary tree (gallbladder not image), status post sphincterotomy with recovery of small pieces of stone material, status post balloon occlusion cholangiogram.  . ERCP  01/07/2012   Procedure: ENDOSCOPIC RETROGRADE CHOLANGIOPANCREATOGRAPHY (ERCP);  Surgeon: Rogene Houston, MD;  [facility]: [facility];  Service: Endoscopy;  Laterality: N/A;  possible biliary stenting  . ERCP  02/21/2012   Procedure: ENDOSCOPIC RETROGRADE CHOLANGIOPANCREATOGRAPHY (ERCP);  Surgeon:  Rogene Houston, MD;  [facility]: [facility];  Service: Endoscopy;  Laterality: N/A;  common bile duct stone and clip removed  . ERCP N/A 05/18/2012   Procedure: ENDOSCOPIC RETROGRADE CHOLANGIOPANCREATOGRAPHY (ERCP);  Surgeon: Rogene Houston, MD;  Location: [facility];  Service: Endoscopy;  Laterality: N/A;  stone and debri removal  . ESOPHAGOGASTRODUODENOSCOPY  August 2005   Erosive esophagitis and Schatzki ring, small hiatal hernia  . ESOPHAGOGASTRODUODENOSCOPY  01/2008   Mild reflux esophagitis, small hiatal hernia  . ESOPHAGOGASTRODUODENOSCOPY  02/01/2011   Rourk-erosive reflux esophagitis, small hiatal hernia  . ESOPHAGOGASTRODUODENOSCOPY  10/26/2011   Dr. Eli Phillips gastric submucosal petechia (bx-benign ulceration), juxta ampullary duodenal diverticulum and some mucosal edema involving 1st/2nd portion of duodenum with superficial erosions (bx-superficial ulceration/benign)  . ESOPHAGOGASTRODUODENOSCOPY  02/21/2012   Procedure: ESOPHAGOGASTRODUODENOSCOPY (EGD);  Surgeon: Rogene Houston, MD;  [facility];  Service: Endoscopy;  Laterality: N/A;  . EYE SURGERY    . Incomplete colonoscopy  December 2009   Left-sided diverticula, mid descending colon polyp, due to recurrent looping and redundancy exam was incomplete. It was felt that the mid colon was reached. Followup barium enema showed colon interposition between the liver and the diaphragm, redundant sigmoid colon but no colon mass or polyp identified.. Pathology revealed tubular adenoma.  . SPHINCTEROTOMY  01/07/2012   Procedure: SPHINCTEROTOMY;  Surgeon: Rogene Houston, MD;  Location[facility];  Service: Endoscopy;  Laterality: N/A;  Extended  . SPYGLASS CHOLANGIOSCOPY  02/21/2012   Procedure: SPYGLASS CHOLANGIOSCOPY;  Surgeon: Rogene Houston, MD;  [facility];  Service: Endoscopy;  Laterality: N/A;  . SPYGLASS CHOLANGIOSCOPY N/A 05/18/2012   Procedure: SPYGLASS CHOLANGIOSCOPY;  Surgeon: Rogene Houston, MD;  [facility];   Service: Endoscopy;  Laterality: N/A;  . TOTAL KNEE ARTHROPLASTY  03/21/2012   Procedure: TOTAL KNEE ARTHROPLASTY;  Surgeon: [name], MD;  Location: [place];  Service: Orthopedics;  Laterality: Right;  right knee arthroplasty    Social History   Socioeconomic History  . Marital status: Married    Spouse name: Not on file  . Number of children: 2  . Years of education: Not on file  . Highest education level: Not on file  Occupational History  . Occupation: Retired Chemical engineer: RETIRED  Social Needs  . Financial resource strain: Not on file  . Food insecurity:    Worry: Not on file    Inability: Not on file  . Transportation needs:    Medical: Not on file    Non-medical: Not on file  Tobacco Use  . Smoking status: Never Smoker  . Smokeless tobacco: Never Used  Substance and Sexual Activity  . Alcohol use: No    Alcohol/week: 0.0 oz  . Drug use: No  . Sexual activity: Yes    Birth control/protection: None  Lifestyle  . Physical  activity:    Days per week: Not on file    Minutes per session: Not on file  . Stress: Not on file  Relationships  . Social connections:    Talks on phone: Not on file    Gets together: Not on file    Attends religious service: Not on file    Active member of club or organization: Not on file    Attends meetings of clubs or organizations: Not on file    Relationship status: Not on file  . Intimate partner violence:    Fear of current or ex partner: Not on file    Emotionally abused: Not on file    Physically abused: Not on file    Forced sexual activity: Not on file  Other Topics Concern  . Not on file  Social History Narrative  . Not on file     Vitals:   08/30/17 1249  BP: 120/80  Pulse: 83  SpO2: 94%  Weight: 254 lb (115.2 kg)  Height: 6\' 2"  (1.88 m)    Wt Readings from Last 3 Encounters:  08/30/17 254 lb (115.2 kg)  05/05/17 255 lb 12.8 oz (116 kg)  05/30/16 258 lb 6.4 oz (117.2 kg)     PHYSICAL  EXAM General: NAD HEENT: Normal. Neck: No JVD, no thyromegaly. Lungs: Clear to auscultation bilaterally with normal respiratory effort. CV: Regular rate and rhythm, normal S1/S2, no S3/S4, no murmur. No pretibial or periankle edema.  No carotid bruit.   Abdomen: Soft, nontender, no distention.  Neurologic: Alert and oriented.  Psych: Normal affect. Skin: Normal. Musculoskeletal: No gross deformities.    ECG: Reviewed above under Subjective   Labs: Lab Results  Component Value Date/Time   K 4.7 08/11/2014 09:54 AM   BUN 34 (H) 08/11/2014 09:54 AM   CREATININE 2.10 (H) 06/09/2016 03:05 PM   CREATININE 1.34 07/23/2013 11:17 AM   ALT 30 07/23/2013 11:17 AM   TSH 2.862 01/19/2011 09:37 AM   HGB 12.6 (L) 08/11/2014 09:54 AM     Lipids: Lab Results  Component Value Date/Time   LDLCALC 124 11/10/2015 07:34 AM   CHOL 200 11/10/2015 07:34 AM   TRIG 208 (H) 11/10/2015 07:34 AM   HDL 34 (L) 11/10/2015 07:34 AM       ASSESSMENT AND PLAN: 1.  Palpitations with frequent PVCs: Symptomatically improved.  ECG deviously reviewed with frequent PVCs.  He has presumed coronary artery disease.  I will continue metoprolol 25 mg twice daily.  Given his marked fatigue even after a good night's rest, he may have sleep apnea which is a known etiology of cardiac arrhythmias.  I previously ordered a sleep study but this was not pursued.  2.  Presumed coronary artery disease with exertional dyspnea: Nuclear stress test reviewed above.    There appears to be a worsening of symptoms since his last visit.  I will obtain an exercise Myoview stress test to evaluate for an interval increase in the degree of ischemia since his last stress test.  Continue aspirin, metoprolol, and Crestor.  3.  Hypertension: Blood pressure is normal.  No changes to therapy.  4.  Hypercholesterolemia: Lipids previously reviewed.  Continue Crestor 20 mg.  5.  Fatigue: It is possible he has sleep disordered breathing.  If he  has sleep apnea, this would explain uncontrolled hypertension as well as palpitations with arrhythmias.  I ordered a sleep study at his last visit but this was not pursued..     Disposition: Follow  up 2 months   Kate Sable, M.D., F.A.C.C.

## 2017-08-30 NOTE — Patient Instructions (Addendum)
Medication Instructions:  Continue all current medications.  Labwork: none  Testing/Procedures: Your physician has requested that you have en exercise stress myoview. For further information please visit [email or web address]. Please follow instruction sheet, as given.  Office will contact with results via phone or letter.    Follow-Up: 2 months   Any Other Special Instructions Will Be Listed Below (If Applicable).  If you need a refill on your cardiac medications before your next appointment, please call your pharmacy.

## 2017-08-30 NOTE — Telephone Encounter (Signed)
Pre-cert Verification for the following procedure   Exercise myoview scheduled for 09/06/2017

## 2017-09-04 DIAGNOSIS — R079 Chest pain, unspecified: Secondary | ICD-10-CM | POA: Diagnosis not present

## 2017-09-04 DIAGNOSIS — R7301 Impaired fasting glucose: Secondary | ICD-10-CM | POA: Diagnosis not present

## 2017-09-04 DIAGNOSIS — I10 Essential (primary) hypertension: Secondary | ICD-10-CM | POA: Diagnosis not present

## 2017-09-04 DIAGNOSIS — R42 Dizziness and giddiness: Secondary | ICD-10-CM | POA: Diagnosis not present

## 2017-09-04 DIAGNOSIS — N400 Enlarged prostate without lower urinary tract symptoms: Secondary | ICD-10-CM | POA: Diagnosis not present

## 2017-09-04 DIAGNOSIS — R319 Hematuria, unspecified: Secondary | ICD-10-CM | POA: Diagnosis not present

## 2017-09-04 DIAGNOSIS — J060 Acute laryngopharyngitis: Secondary | ICD-10-CM | POA: Diagnosis not present

## 2017-09-04 DIAGNOSIS — Z8546 Personal history of malignant neoplasm of prostate: Secondary | ICD-10-CM | POA: Diagnosis not present

## 2017-09-04 DIAGNOSIS — H8149 Vertigo of central origin, unspecified ear: Secondary | ICD-10-CM | POA: Diagnosis not present

## 2017-09-04 DIAGNOSIS — N183 Chronic kidney disease, stage 3 (moderate): Secondary | ICD-10-CM | POA: Diagnosis not present

## 2017-09-04 DIAGNOSIS — R944 Abnormal results of kidney function studies: Secondary | ICD-10-CM | POA: Diagnosis not present

## 2017-09-04 DIAGNOSIS — M542 Cervicalgia: Secondary | ICD-10-CM | POA: Diagnosis not present

## 2017-09-06 ENCOUNTER — Encounter (HOSPITAL_COMMUNITY): Payer: Self-pay

## 2017-09-06 ENCOUNTER — Encounter (HOSPITAL_BASED_OUTPATIENT_CLINIC_OR_DEPARTMENT_OTHER)
Admission: RE | Admit: 2017-09-06 | Discharge: 2017-09-06 | Disposition: A | Discharge status: Home / Self Care | Payer: PPO | Source: Ambulatory Visit | Attending: Cardiovascular Disease | Admitting: Cardiovascular Disease

## 2017-09-06 ENCOUNTER — Telehealth: Payer: Self-pay | Admitting: Physician Assistant

## 2017-09-06 ENCOUNTER — Encounter (HOSPITAL_COMMUNITY)
Admission: RE | Admit: 2017-09-06 | Discharge: 2017-09-06 | Disposition: A | Discharge status: Home / Self Care | Payer: PPO | Source: Ambulatory Visit | Attending: Cardiovascular Disease | Admitting: Cardiovascular Disease

## 2017-09-06 DIAGNOSIS — R0609 Other forms of dyspnea: Secondary | ICD-10-CM | POA: Insufficient documentation

## 2017-09-06 DIAGNOSIS — R001 Bradycardia, unspecified: Secondary | ICD-10-CM

## 2017-09-06 LAB — NM MYOCAR MULTI W/SPECT W/WALL MOTION / EF
CHL CUP NUCLEAR SDS: 5
CHL CUP NUCLEAR SRS: 4
CSEPPHR: 134 {beats}/min
Estimated workload: 6.6 METS
Exercise duration (min): 4 min
Exercise duration (sec): 20 s
LVDIAVOL: 113 mL (ref 62–150)
LVSYSVOL: 49 mL
MPHR: 142 {beats}/min
NUC STRESS TID: 1.08
Percent HR: 94 %
RATE: 0.31
RPE: 13
Rest HR: 45 {beats}/min
SSS: 9

## 2017-09-06 MED ORDER — SODIUM CHLORIDE 0.9% FLUSH
INTRAVENOUS | Status: AC
  Administered 2017-09-06: 10:00:00 via INTRAVENOUS
  Filled 2017-09-06: qty 10

## 2017-09-06 MED ORDER — TECHNETIUM TC 99M TETROFOSMIN IV KIT
30.0000 | PACK | Freq: Once | INTRAVENOUS | Status: AC | PRN
  Administered 2017-09-06: 31 via INTRAVENOUS

## 2017-09-06 MED ORDER — METOPROLOL TARTRATE 25 MG PO TABS: 25.0000 mg | ORAL_TABLET | Freq: Two times a day (BID) | ORAL | 6 refills | Status: DC

## 2017-09-06 MED ORDER — REGADENOSON 0.4 MG/5ML IV SOLN
INTRAVENOUS | Status: AC
  Filled 2017-09-06: qty 5

## 2017-09-06 MED ORDER — TECHNETIUM TC 99M TETROFOSMIN IV KIT
10.0000 | PACK | Freq: Once | INTRAVENOUS | Status: AC | PRN
  Administered 2017-09-06: 10.8 via INTRAVENOUS

## 2017-09-06 NOTE — Telephone Encounter (Signed)
Patient presented for exercise nuc today, baseline EKG noted to show sinus bradycardia with a RBBB (has known RBBB/LAFB), but HR 44-49 on monitor. Last dose of metoprolol 25mg  yesterday AM. Has h/o freq PVCs. He was normotensive. Did have appropriate HR response on treadmill to 130s, but decreased exercise tolerance with chest discomfort/dyspnea at peak exercise. He did have some TW changes in recovery that resolved with rest. Pt was asymptomatic at end of test. I told the patient to please hold the metoprolol further until I can send to Dr. Bronson Ing for review. Dr. Raliegh Ip, please send your recs to your nurse to call the patient. Thanks! Dayna Dunn PA-C

## 2017-09-06 NOTE — Telephone Encounter (Signed)
Please have pt hold metoprolol altogether and have him wear a one week event monitor to see if he is experiencing symptomatic bradycardia.

## 2017-09-06 NOTE — Telephone Encounter (Signed)
Patient informed and verbalized understanding of plan. 

## 2017-09-08 DIAGNOSIS — R001 Bradycardia, unspecified: Secondary | ICD-10-CM | POA: Diagnosis not present

## 2017-09-11 ENCOUNTER — Ambulatory Visit (INDEPENDENT_AMBULATORY_CARE_PROVIDER_SITE_OTHER): Payer: PPO

## 2017-09-11 DIAGNOSIS — R001 Bradycardia, unspecified: Secondary | ICD-10-CM | POA: Diagnosis not present

## 2017-09-25 DIAGNOSIS — Z8546 Personal history of malignant neoplasm of prostate: Secondary | ICD-10-CM | POA: Diagnosis not present

## 2017-09-29 ENCOUNTER — Ambulatory Visit: Payer: PPO | Admitting: Urology

## 2017-09-29 DIAGNOSIS — N5201 Erectile dysfunction due to arterial insufficiency: Secondary | ICD-10-CM | POA: Diagnosis not present

## 2017-09-29 DIAGNOSIS — N3941 Urge incontinence: Secondary | ICD-10-CM | POA: Diagnosis not present

## 2017-09-29 DIAGNOSIS — Z8546 Personal history of malignant neoplasm of prostate: Secondary | ICD-10-CM | POA: Diagnosis not present

## 2017-09-29 DIAGNOSIS — N401 Enlarged prostate with lower urinary tract symptoms: Secondary | ICD-10-CM | POA: Diagnosis not present

## 2017-09-29 DIAGNOSIS — N3041 Irradiation cystitis with hematuria: Secondary | ICD-10-CM

## 2017-10-11 DIAGNOSIS — N3941 Urge incontinence: Secondary | ICD-10-CM | POA: Diagnosis not present

## 2017-11-01 ENCOUNTER — Encounter (INDEPENDENT_AMBULATORY_CARE_PROVIDER_SITE_OTHER): Payer: Self-pay | Admitting: Orthopaedic Surgery

## 2017-11-01 ENCOUNTER — Ambulatory Visit (INDEPENDENT_AMBULATORY_CARE_PROVIDER_SITE_OTHER): Payer: PPO | Admitting: Orthopaedic Surgery

## 2017-11-01 DIAGNOSIS — M1712 Unilateral primary osteoarthritis, left knee: Secondary | ICD-10-CM | POA: Diagnosis not present

## 2017-11-01 MED ORDER — BUPIVACAINE HCL 0.5 % IJ SOLN
2.0000 mL | INTRAMUSCULAR | Status: AC | PRN
  Administered 2017-11-01: 2 mL via INTRA_ARTICULAR

## 2017-11-01 MED ORDER — LIDOCAINE HCL 1 % IJ SOLN
2.0000 mL | INTRAMUSCULAR | Status: AC | PRN
  Administered 2017-11-01: 2 mL

## 2017-11-01 MED ORDER — METHYLPREDNISOLONE ACETATE 40 MG/ML IJ SUSP
80.0000 mg | INTRAMUSCULAR | Status: AC | PRN
  Administered 2017-11-01: 80 mg

## 2017-11-01 NOTE — Progress Notes (Signed)
Office Visit Note   Patient: [name]           Date of Birth: 1940/11/15           MRN: [id number] Visit Date: 11/01/2017              Requested by: Celene Squibb, MD [place], [place] [id number] PCP: Celene Squibb, MD   Assessment & Plan: Visit Diagnoses:  1. Unilateral primary osteoarthritis, left knee     Plan: Cortisone injection left knee today and start Supartz in 2 weeks  Follow-Up Instructions: Return in about 2 weeks (around 11/15/2017).   Orders:  Orders Placed This Encounter  Procedures  . Large Joint Inj: L knee   No orders of the defined types were placed in this encounter.     Procedures: Large Joint Inj: L knee on 11/01/2017 11:21 AM Indications: pain and diagnostic evaluation Details: 25 G 1.5 in needle, anteromedial approach  Arthrogram: No  Medications: 2 mL lidocaine 1 %; 2 mL bupivacaine 0.5 %; 80 mg methylPREDNISolone acetate 40 MG/ML Procedure, treatment alternatives, risks and benefits explained, specific risks discussed. Consent was given by the patient. Patient was prepped and draped in the usual sterile fashion.       Clinical Data: No additional findings.   Subjective: Chief Complaint  Patient presents with  . Follow-up    l knee injection # 1   [name] has a history of osteoarthritis in both of his knees.  Prior films demonstrate significant medial joint arthritis but with tricompartmental changes.  We had talked about Visco supplementation and he has been approved for [employer].  We will plan on injecting his left knee with cortisone today as it is the most symptomatic and begin Supartz in 2 weeks  HPI  Review of Systems  Constitutional: Negative for fatigue and fever.  HENT: Negative for ear pain.   Eyes: Negative for pain.  Respiratory: Negative for cough and shortness of breath.   Cardiovascular: Negative for leg swelling.  Gastrointestinal: Negative for constipation and diarrhea.  Genitourinary: Positive  for difficulty urinating.  Musculoskeletal: Negative for back pain and neck pain.  Skin: Negative for rash.  Allergic/Immunologic: Negative for food allergies.  Neurological: Negative for weakness and numbness.  Hematological: Bruises/bleeds easily.  Psychiatric/Behavioral: Negative for sleep disturbance.     Objective: Vital Signs: BP (!) 144/68 (BP Location: Left Arm, Patient Position: Sitting, Cuff Size: Normal)   Pulse 63   Ht 6' 2.5" (1.892 m)   Wt 242 lb (109.8 kg)   BMI 30.66 kg/m   Physical Exam  Constitutional: He is oriented to person, place, and time. He appears well-developed and well-nourished.  HENT:  Mouth/Throat: Oropharynx is clear and moist.  Eyes: Pupils are equal, round, and reactive to light. EOM are normal.  Pulmonary/Chest: Effort normal.  Neurological: He is alert and oriented to person, place, and time.  Skin: Skin is warm and dry.  Psychiatric: He has a normal mood and affect. His behavior is normal.    Ortho Exam left knee with slight increased varus.  May be some very small effusion.  No instability.  Full extension flexion over 100 degrees.  No distal edema.  Some venous stasis changes.  Good capillary refill  Specialty Comments:  No specialty comments available.  Imaging: No results found.   PMFS History: Patient Active Problem List   Diagnosis Date Noted  . Unilateral primary osteoarthritis, left knee 11/01/2017  . Abnormal  cardiac function test 09/22/2015  . Chronic renal disease, stage III ([place]) 09/22/2015  . Family history of coronary artery disease in mother 09/22/2015  . Bifascicular block 09/22/2015  . Hypertrophy of breast 11/26/2014  . Thoracic kyphosis 10/28/2013  . Hemiplegia affecting dominant side, late effect of cerebrovascular disease 10/28/2013  . Postoperative shock 10/28/2013  . Acute diastolic congestive heart failure ([place]) 10/28/2013  . Personal history of colonic polyps 07/23/2013  . Osteoarthritis of right knee  03/22/2012  . Common bile duct (CBD) stricture 01/16/2012  . Epigastric pain 01/19/2011  . Hx of adenomatous colonic polyps 01/19/2011  . Constipation 01/19/2011  . Change in bowel function 01/19/2011  . Rectal bleeding 01/19/2011  . Thrombocytopenia ([place]) 01/19/2011   Past Medical History:  Diagnosis Date  . Arthritis   . Chronic back pain   . Complication of anesthesia    "hard to wake up after back surgery"  . GERD (gastroesophageal reflux disease)   . Helicobacter pylori gastritis    Treated  . Hx of adenomatous colonic polyps   . IBS (irritable bowel syndrome)   . Numbness    left leg after back surgery  . Prostate CA ([place])    seed implants  . Renal insufficiency   . Schatzki's ring    History of    Family History  Problem Relation Age of Onset  . Diabetes Mother   . Coronary artery disease Mother   . Coronary artery disease Father   . Diabetes Father   . Stomach cancer Father   . Deep vein thrombosis Daughter   . Hypotension Neg Hx   . Anesthesia problems Neg Hx   . Malignant hyperthermia Neg Hx   . Pseudochol deficiency Neg Hx   . Colon cancer Neg Hx     Past Surgical History:  Procedure Laterality Date  . APPENDECTOMY  age 37  . BACK SURGERY  02/08   [place]  . BACK SURGERY  09/24/04   lumbar, mcmh  . BACK SURGERY  09/18/02   [place]  . BILIARY STENT PLACEMENT  01/07/2012   Procedure: BILIARY STENT PLACEMENT;  Surgeon: Rogene Houston, MD;  [facility];  Service: Endoscopy;  Laterality: N/A;  . BILIARY STENT PLACEMENT  02/21/2012   Procedure: BILIARY STENT PLACEMENT;  Surgeon: Rogene Houston, MD;  [facility];  Service: Endoscopy;  Laterality: N/A;  Biliary Stent Replacement  . CATARACT EXTRACTION W/PHACO  11/22/2010   Procedure: CATARACT EXTRACTION PHACO AND INTRAOCULAR LENS PLACEMENT (IOC);  Surgeon: Williams Che;  [facility]: [facility];  Service: Ophthalmology;  Laterality: Right;  CDE: 6.81  . CHOLECYSTECTOMY  03/04/05   [facility], [facility]. Gangrenous  cholecystitis complicated by abscess requiring percutaneous drainage. He also had common duct stones requiring ERCP and sphincterotomy.  . COLONOSCOPY  August 2005   Scattered sigmoid diverticulosis, splenic flexure polyp. Hyperplastic  . COLONOSCOPY  1997   3 cm tubular adenoma and a sending colon  . COLONOSCOPY  02/01/2011   Rourk-friable anal canal, hyperplastic rectal polyp  . COLONOSCOPY N/A 08/15/2013   Procedure: COLONOSCOPY;  Surgeon: Rogene Houston, MD;  [facility];  Service: Endoscopy;  Laterality: N/A;  100  . ERCP  03/02/05   [facility], R[facility]. Normal-appearing biliary tree (gallbladder not image), status post sphincterotomy with recovery of small pieces of stone material, status post balloon occlusion cholangiogram.  . ERCP  01/07/2012   Procedure: ENDOSCOPIC RETROGRADE CHOLANGIOPANCREATOGRAPHY (ERCP);  Surgeon: Rogene Houston, MD;  [facility];  Service: Endoscopy;  Laterality: N/A;  possible biliary stenting  . ERCP  02/21/2012   Procedure: ENDOSCOPIC RETROGRADE CHOLANGIOPANCREATOGRAPHY (ERCP);  Surgeon: Rogene Houston, MD;  [facility];  Service: Endoscopy;  Laterality: N/A;  common bile duct stone and clip removed  . ERCP N/A 05/18/2012   Procedure: ENDOSCOPIC RETROGRADE CHOLANGIOPANCREATOGRAPHY (ERCP);  Surgeon: Rogene Houston, MD;  [facility];  Service: Endoscopy;  Laterality: N/A;  stone and debri removal  . ESOPHAGOGASTRODUODENOSCOPY  August 2005   Erosive esophagitis and Schatzki ring, small hiatal hernia  . ESOPHAGOGASTRODUODENOSCOPY  01/2008   Mild reflux esophagitis, small hiatal hernia  . ESOPHAGOGASTRODUODENOSCOPY  02/01/2011   Rourk-erosive reflux esophagitis, small hiatal hernia  . ESOPHAGOGASTRODUODENOSCOPY  10/26/2011   Dr. Eli Phillips gastric submucosal petechia (bx-benign ulceration), juxta ampullary duodenal diverticulum and some mucosal edema involving 1st/2nd portion of duodenum with superficial erosions (bx-superficial  ulceration/benign)  . ESOPHAGOGASTRODUODENOSCOPY  02/21/2012   Procedure: ESOPHAGOGASTRODUODENOSCOPY (EGD);  Surgeon: Rogene Houston, MD;  [facility];  Service: Endoscopy;  Laterality: N/A;  . EYE SURGERY    . Incomplete colonoscopy  December 2009   Left-sided diverticula, mid descending colon polyp, due to recurrent looping and redundancy exam was incomplete. It was felt that the mid colon was reached. Followup barium enema showed colon interposition between the liver and the diaphragm, redundant sigmoid colon but no colon mass or polyp identified.. Pathology revealed tubular adenoma.  . SPHINCTEROTOMY  01/07/2012   Procedure: SPHINCTEROTOMY;  Surgeon: Rogene Houston, MD;  [facility];  Service: Endoscopy;  Laterality: N/A;  Extended  . SPYGLASS CHOLANGIOSCOPY  02/21/2012   Procedure: SPYGLASS CHOLANGIOSCOPY;  Surgeon: Rogene Houston, MD;  [facility];  Service: Endoscopy;  Laterality: N/A;  . SPYGLASS CHOLANGIOSCOPY N/A 05/18/2012   Procedure: SPYGLASS CHOLANGIOSCOPY;  Surgeon: Rogene Houston, MD;  [facility];  Service: Endoscopy;  Laterality: N/A;  . TOTAL KNEE ARTHROPLASTY  03/21/2012   Procedure: TOTAL KNEE ARTHROPLASTY;  Surgeon: [name], MD;  Location: [facility];  Service: Orthopedics;  Laterality: Right;  right knee arthroplasty   Social History   Occupational History  . Occupation: Retired Chemical engineer: RETIRED  Tobacco Use  . Smoking status: Never Smoker  . Smokeless tobacco: Never Used  Substance and Sexual Activity  . Alcohol use: No    Alcohol/week: 0.0 standard drinks  . Drug use: No  . Sexual activity: Yes    Birth control/protection: None

## 2017-11-07 ENCOUNTER — Ambulatory Visit: Payer: PPO | Admitting: Cardiovascular Disease

## 2017-11-07 ENCOUNTER — Encounter: Payer: Self-pay | Admitting: Cardiovascular Disease

## 2017-11-07 VITALS — BP 152/80 | HR 69 | Ht 74.0 in | Wt 256.0 lb

## 2017-11-07 DIAGNOSIS — I25118 Atherosclerotic heart disease of native coronary artery with other forms of angina pectoris: Secondary | ICD-10-CM | POA: Diagnosis not present

## 2017-11-07 DIAGNOSIS — R0609 Other forms of dyspnea: Secondary | ICD-10-CM | POA: Diagnosis not present

## 2017-11-07 DIAGNOSIS — I493 Ventricular premature depolarization: Secondary | ICD-10-CM | POA: Diagnosis not present

## 2017-11-07 DIAGNOSIS — R5383 Other fatigue: Secondary | ICD-10-CM | POA: Diagnosis not present

## 2017-11-07 DIAGNOSIS — R001 Bradycardia, unspecified: Secondary | ICD-10-CM | POA: Diagnosis not present

## 2017-11-07 DIAGNOSIS — I10 Essential (primary) hypertension: Secondary | ICD-10-CM

## 2017-11-07 DIAGNOSIS — E785 Hyperlipidemia, unspecified: Secondary | ICD-10-CM

## 2017-11-07 DIAGNOSIS — R002 Palpitations: Secondary | ICD-10-CM | POA: Diagnosis not present

## 2017-11-07 MED ORDER — AMLODIPINE BESYLATE 10 MG PO TABS: 10.0000 mg | ORAL_TABLET | Freq: Every day | ORAL | 3 refills | Status: DC

## 2017-11-07 NOTE — Progress Notes (Signed)
SUBJECTIVE: The patient presents for routine follow-up.  When he presented for his nuclear stress test, EKG showed sinus bradycardia with a right bundle branch block and left anterior fascicular block with heart rate in the mid 40 bpm range.  He taken his last dose of metoprolol the morning before.  He does have a history of frequent PVCs.  He did have an appropriate heart rate response on the treadmill to the 130 bpm range but did have decreased exercise tolerance with chest discomfort/dyspnea at peak exercise.  Metoprolol was held altogether afterwards.  Event monitoring demonstrated sinus rhythm and sinus arrhythmia with first-degree AV block.  There were isolated PACs.  Average heart rate was 75 bpm.  I ordered a sleep study at a prior visit but he did not pursue it.  He has a presumed history of coronary artery disease based on nuclear stress testing on 09/16/2015 which showed prior inferior myocardial infarction with moderate peri-infarct ischemia.  He also has a history of CVA, MRSA, and renal failure.  Due to worsening exertional dyspnea, I ordered a follow-up nuclear stress test.  It was a low risk study.  There appeared to be possible subtle diaphragmatic attenuation versus prior infarct with very mild peri-infarct ischemia.  The patient denies any symptoms of chest pain, palpitations, shortness of breath, lightheadedness, dizziness, leg swelling, orthopnea, PND, and syncope.  He cannot say whether he feels any better or any worse being off of metoprolol.  He said he feels well overall.  He said he would not be able to tolerate CPAP if he had sleep apnea because he frequently urinates at night.   Review of Systems: As per "subjective", otherwise negative.  No Known Allergies  Current Outpatient Medications  Medication Sig Dispense Refill  . amLODipine (NORVASC) 5 MG tablet Take 5 mg by mouth daily.    Marland Kitchen aspirin EC 81 MG tablet Take 1 tablet (81 mg total) by mouth daily. 90  tablet 3  . polyethylene glycol (MIRALAX / GLYCOLAX) packet Take by mouth daily.    . pravastatin (PRAVACHOL) 10 MG tablet Take 10 mg by mouth daily.    . nitroGLYCERIN (NITROSTAT) 0.4 MG SL tablet Place 1 tablet (0.4 mg total) under the tongue every 5 (five) minutes as needed for chest pain. 25 tablet 2   No current facility-administered medications for this visit.     Past Medical History:  Diagnosis Date  . Arthritis   . Chronic back pain   . Complication of anesthesia    "hard to wake up after back surgery"  . GERD (gastroesophageal reflux disease)   . Helicobacter pylori gastritis    Treated  . Hx of adenomatous colonic polyps   . IBS (irritable bowel syndrome)   . Numbness    left leg after back surgery  . Prostate CA ([place])    seed implants  . Renal insufficiency   . Schatzki's ring    History of    Past Surgical History:  Procedure Laterality Date  . APPENDECTOMY  age 67  . BACK SURGERY  02/08   [place]  . BACK SURGERY  09/24/04   lumbar, mcmh  . BACK SURGERY  09/18/02   [place]  . BILIARY STENT PLACEMENT  01/07/2012   Procedure: BILIARY STENT PLACEMENT;  Surgeon: Rogene Houston, MD;  [facility];  Service: Endoscopy;  Laterality: N/A;  . BILIARY STENT PLACEMENT  02/21/2012   Procedure: BILIARY STENT PLACEMENT;  Surgeon: Rogene Houston, MD;  [facility];  Service: Endoscopy;  Laterality: N/A;  Biliary Stent Replacement  . CATARACT EXTRACTION W/PHACO  11/22/2010   Procedure: CATARACT EXTRACTION PHACO AND INTRAOCULAR LENS PLACEMENT (IOC);  Surgeon: Williams Che;  [facility];  Service: Ophthalmology;  Laterality: Right;  CDE: 6.81  . CHOLECYSTECTOMY  03/04/05   [facility], [facility]. Gangrenous cholecystitis complicated by abscess requiring percutaneous drainage. He also had common duct stones requiring ERCP and sphincterotomy.  . COLONOSCOPY  August 2005   Scattered sigmoid diverticulosis, splenic flexure polyp. Hyperplastic  . COLONOSCOPY  1997   3 cm tubular  adenoma and a sending colon  . COLONOSCOPY  02/01/2011   Rourk-friable anal canal, hyperplastic rectal polyp  . COLONOSCOPY N/A 08/15/2013   Procedure: COLONOSCOPY;  Surgeon: Rogene Houston, MD;  [facility];  Service: Endoscopy;  Laterality: N/A;  100  . ERCP  03/02/05   [facility], Rourk. Normal-appearing biliary tree (gallbladder not image), status post sphincterotomy with recovery of small pieces of stone material, status post balloon occlusion cholangiogram.  . ERCP  01/07/2012   Procedure: ENDOSCOPIC RETROGRADE CHOLANGIOPANCREATOGRAPHY (ERCP);  Surgeon: Rogene Houston, MD;  [facility];  Service: Endoscopy;  Laterality: N/A;  possible biliary stenting  . ERCP  02/21/2012   Procedure: ENDOSCOPIC RETROGRADE CHOLANGIOPANCREATOGRAPHY (ERCP);  Surgeon: Rogene Houston, MD;  [facility];  Service: Endoscopy;  Laterality: N/A;  common bile duct stone and clip removed  . ERCP N/A 05/18/2012   Procedure: ENDOSCOPIC RETROGRADE CHOLANGIOPANCREATOGRAPHY (ERCP);  Surgeon: Rogene Houston, MD;  [facility];  Service: Endoscopy;  Laterality: N/A;  stone and debri removal  . ESOPHAGOGASTRODUODENOSCOPY  August 2005   Erosive esophagitis and Schatzki ring, small hiatal hernia  . ESOPHAGOGASTRODUODENOSCOPY  01/2008   Mild reflux esophagitis, small hiatal hernia  . ESOPHAGOGASTRODUODENOSCOPY  02/01/2011   Rourk-erosive reflux esophagitis, small hiatal hernia  . ESOPHAGOGASTRODUODENOSCOPY  10/26/2011   Dr. Eli Phillips gastric submucosal petechia (bx-benign ulceration), juxta ampullary duodenal diverticulum and some mucosal edema involving 1st/2nd portion of duodenum with superficial erosions (bx-superficial ulceration/benign)  . ESOPHAGOGASTRODUODENOSCOPY  02/21/2012   Procedure: ESOPHAGOGASTRODUODENOSCOPY (EGD);  Surgeon: Rogene Houston, MD;  [facility];  Service: Endoscopy;  Laterality: N/A;  . EYE SURGERY    . Incomplete colonoscopy  December 2009   Left-sided diverticula,  mid descending colon polyp, due to recurrent looping and redundancy exam was incomplete. It was felt that the mid colon was reached. Followup barium enema showed colon interposition between the liver and the diaphragm, redundant sigmoid colon but no colon mass or polyp identified.. Pathology revealed tubular adenoma.  . SPHINCTEROTOMY  01/07/2012   Procedure: SPHINCTEROTOMY;  Surgeon: Rogene Houston, MD;  [facility];  Service: Endoscopy;  Laterality: N/A;  Extended  . SPYGLASS CHOLANGIOSCOPY  02/21/2012   Procedure: SPYGLASS CHOLANGIOSCOPY;  Surgeon: Rogene Houston, MD;  [facility];  Service: Endoscopy;  Laterality: N/A;  . SPYGLASS CHOLANGIOSCOPY N/A 05/18/2012   Procedure: SPYGLASS CHOLANGIOSCOPY;  Surgeon: Rogene Houston, MD;  [facility];  Service: Endoscopy;  Laterality: N/A;  . TOTAL KNEE ARTHROPLASTY  03/21/2012   Procedure: TOTAL KNEE ARTHROPLASTY;  Surgeon: [name], MD;  Location: [facility];  Service: Orthopedics;  Laterality: Right;  right knee arthroplasty    Social History   Socioeconomic History  . Marital status: Married    Spouse name: Not on file  . Number of children: 2  . Years of education: Not on file  . Highest education  level: Not on file  Occupational History  . Occupation: Retired Chemical engineer: RETIRED  Social Needs  . Financial resource strain: Not on file  . Food insecurity:    Worry: Not on file    Inability: Not on file  . Transportation needs:    Medical: Not on file    Non-medical: Not on file  Tobacco Use  . Smoking status: Never Smoker  . Smokeless tobacco: Never Used  Substance and Sexual Activity  . Alcohol use: No    Alcohol/week: 0.0 standard drinks  . Drug use: No  . Sexual activity: Yes    Birth control/protection: None  Lifestyle  . Physical activity:    Days per week: Not on file    Minutes per session: Not on file  . Stress: Not on file  Relationships  . Social connections:    Talks on phone: Not on  file    Gets together: Not on file    Attends religious service: Not on file    Active member of club or organization: Not on file    Attends meetings of clubs or organizations: Not on file    Relationship status: Not on file  . Intimate partner violence:    Fear of current or ex partner: Not on file    Emotionally abused: Not on file    Physically abused: Not on file    Forced sexual activity: Not on file  Other Topics Concern  . Not on file  Social History Narrative  . Not on file     Vitals:   11/07/17 0824  BP: (!) 152/80  Pulse: 69  SpO2: 94%  Weight: 256 lb (116.1 kg)  Height: 6\' 2"  (1.88 m)    Wt Readings from Last 3 Encounters:  11/07/17 256 lb (116.1 kg)  11/01/17 242 lb (109.8 kg)  08/30/17 254 lb (115.2 kg)     PHYSICAL EXAM General: NAD HEENT: Normal. Neck: No JVD, no thyromegaly. Lungs: Clear to auscultation bilaterally with normal respiratory effort. CV: Regular rate and rhythm, normal S1/S2, no S3/S4, no murmur. No pretibial or periankle edema.  No carotid bruit.   Abdomen: Soft, nontender, no distention.  Neurologic: Alert and oriented.  Psych: Normal affect. Skin: Normal. Musculoskeletal: No gross deformities.    ECG: Reviewed above under Subjective   Labs: Lab Results  Component Value Date/Time   K 4.7 08/11/2014 09:54 AM   BUN 34 (H) 08/11/2014 09:54 AM   CREATININE 2.10 (H) 06/09/2016 03:05 PM   CREATININE 1.34 07/23/2013 11:17 AM   ALT 30 07/23/2013 11:17 AM   TSH 2.862 01/19/2011 09:37 AM   HGB 12.6 (L) 08/11/2014 09:54 AM     Lipids: Lab Results  Component Value Date/Time   LDLCALC 124 11/10/2015 07:34 AM   CHOL 200 11/10/2015 07:34 AM   TRIG 208 (H) 11/10/2015 07:34 AM   HDL 34 (L) 11/10/2015 07:34 AM       ASSESSMENT AND PLAN:  1. Palpitations with frequent PVCs: Symptomatically improved.  ECG previously reviewed with frequent PVCs. Event monitoring performed recently did not demonstrate this.  He has presumed  coronary artery disease. Due to bradycardia at time of stress test, I held metoprolol. Given his marked fatigue even after a good night's rest, he may have sleep apnea which is a known etiology of cardiac arrhythmias. I previously ordered a sleep study but this was not pursued.  See further discussion above.  2. Presumed coronary artery disease with exertional dyspnea:  Symptomatically improved.  Nuclear stress test reviewed above.     Nuclear stress test was low risk as detailed above.  Continue aspirin and pravastatin.  3. Hypertension: Blood pressure is elevated.  He is off of metoprolol.  I will increase amlodipine to 10 mg.  4. Hypercholesterolemia: Lipids previously reviewed.  Continue  pravastatin.  5. Fatigue: It is possible he has sleep disordered breathing. If he has sleep apnea, this would explain uncontrolled hypertension as well as palpitations with arrhythmias. I ordered a sleep study at a prior visit but this was not pursued..    Disposition: Follow up 1 year   Kate Sable, M.D., F.A.C.C.

## 2017-11-07 NOTE — Patient Instructions (Signed)
Your physician wants you to follow-up in: 1 year with Dr.Koneswaran You will receive a reminder letter in the mail two months in advance. If you don't receive a letter, please call our office to schedule the follow-up appointment.      INCREASE Amlodipine to 10 mg daily    If you need a refill on your cardiac medications before your next appointment, please call your pharmacy.      No lab work or testing ordered today.      Thank you for choosing [facility] !

## 2017-11-10 ENCOUNTER — Ambulatory Visit: Payer: PPO | Admitting: Urology

## 2017-11-10 DIAGNOSIS — N3941 Urge incontinence: Secondary | ICD-10-CM

## 2017-11-10 DIAGNOSIS — N3041 Irradiation cystitis with hematuria: Secondary | ICD-10-CM | POA: Diagnosis not present

## 2017-11-10 DIAGNOSIS — N401 Enlarged prostate with lower urinary tract symptoms: Secondary | ICD-10-CM

## 2017-11-15 ENCOUNTER — Encounter (INDEPENDENT_AMBULATORY_CARE_PROVIDER_SITE_OTHER): Payer: Self-pay | Admitting: Orthopaedic Surgery

## 2017-11-15 ENCOUNTER — Ambulatory Visit (INDEPENDENT_AMBULATORY_CARE_PROVIDER_SITE_OTHER): Payer: PPO | Admitting: Orthopaedic Surgery

## 2017-11-15 DIAGNOSIS — M1712 Unilateral primary osteoarthritis, left knee: Secondary | ICD-10-CM | POA: Diagnosis not present

## 2017-11-15 MED ORDER — SODIUM HYALURONATE (VISCOSUP) 25 MG/2.5ML IX SOSY
25.0000 mg | PREFILLED_SYRINGE | INTRA_ARTICULAR | Status: AC | PRN
  Administered 2017-11-15: 25 mg via INTRA_ARTICULAR

## 2017-11-15 NOTE — Progress Notes (Signed)
Office Visit Note   Patient: [name]           Date of Birth: 1940/10/13           MRN: [id number] Visit Date: 11/15/2017              Requested by: Celene Squibb, MD [place], [place] [address] PCP: Celene Squibb, MD   Assessment & Plan: Visit Diagnoses:  1. Unilateral primary osteoarthritis, left knee     Plan: Initiate Supartz injections left knee today.  Follow-up weekly for the next 2 weeks to complete series  Follow-Up Instructions: Return in about 1 week (around 11/22/2017).   Orders:  No orders of the defined types were placed in this encounter.  No orders of the defined types were placed in this encounter.     Procedures: Large Joint Inj: L knee on 11/15/2017 11:51 AM Indications: pain and joint swelling Details: 25 G 1.5 in needle, anteromedial approach  Arthrogram: No  Medications: 25 mg Sodium Hyaluronate 25 MG/2.5ML Outcome: tolerated well, no immediate complications Procedure, treatment alternatives, risks and benefits explained, specific risks discussed. Consent was given by the patient. Immediately prior to procedure a time out was called to verify the correct patient, procedure, equipment, support staff and site/side marked as required. Patient was prepped and draped in the usual sterile fashion.       Clinical Data: No additional findings.   Subjective: No chief complaint on file. Has been approved for Supartz injections left knee.  Good result with cortisone injection several weeks ago.  HPI  Review of Systems   Objective: Vital Signs: There were no vitals taken for this visit.  Physical Exam  Ortho Exam knee without effusion.  Mild medial joint pain.  Full extension and flexion over 100 degrees.  No instability.  Specialty Comments:  No specialty comments available.  Imaging: No results found.   PMFS History: Patient Active Problem List   Diagnosis Date Noted  . Unilateral primary osteoarthritis, left knee  11/01/2017  . Abnormal cardiac function test 09/22/2015  . Chronic renal disease, stage III ([place]) 09/22/2015  . Family history of coronary artery disease in mother 09/22/2015  . Bifascicular block 09/22/2015  . Hypertrophy of breast 11/26/2014  . Thoracic kyphosis 10/28/2013  . Hemiplegia affecting dominant side, late effect of cerebrovascular disease 10/28/2013  . Postoperative shock 10/28/2013  . Acute diastolic congestive heart failure ([place]) 10/28/2013  . Personal history of colonic polyps 07/23/2013  . Osteoarthritis of right knee 03/22/2012  . Common bile duct (CBD) stricture 01/16/2012  . Epigastric pain 01/19/2011  . Hx of adenomatous colonic polyps 01/19/2011  . Constipation 01/19/2011  . Change in bowel function 01/19/2011  . Rectal bleeding 01/19/2011  . Thrombocytopenia ([place]) 01/19/2011   Past Medical History:  Diagnosis Date  . Arthritis   . Chronic back pain   . Complication of anesthesia    "hard to wake up after back surgery"  . GERD (gastroesophageal reflux disease)   . Helicobacter pylori gastritis    Treated  . Hx of adenomatous colonic polyps   . IBS (irritable bowel syndrome)   . Numbness    left leg after back surgery  . Prostate CA ([place])    seed implants  . Renal insufficiency   . Schatzki's ring    History of    Family History  Problem Relation Age of Onset  . Diabetes Mother   . Coronary artery disease Mother   .  Coronary artery disease Father   . Diabetes Father   . Stomach cancer Father   . Deep vein thrombosis Daughter   . Hypotension Neg Hx   . Anesthesia problems Neg Hx   . Malignant hyperthermia Neg Hx   . Pseudochol deficiency Neg Hx   . Colon cancer Neg Hx     Past Surgical History:  Procedure Laterality Date  . APPENDECTOMY  age 38  . BACK SURGERY  02/08   [place]  . BACK SURGERY  09/24/04   lumbar, mcmh  . BACK SURGERY  09/18/02   [place]  . BILIARY STENT PLACEMENT  01/07/2012   Procedure: BILIARY STENT PLACEMENT;  Surgeon:  Rogene Houston, MD;  [facility];  Service: Endoscopy;  Laterality: N/A;  . BILIARY STENT PLACEMENT  02/21/2012   Procedure: BILIARY STENT PLACEMENT;  Surgeon: Rogene Houston, MD;  [facility];  Service: Endoscopy;  Laterality: N/A;  Biliary Stent Replacement  . CATARACT EXTRACTION W/PHACO  11/22/2010   Procedure: CATARACT EXTRACTION PHACO AND INTRAOCULAR LENS PLACEMENT (IOC);  Surgeon: Williams Che;  Location: [facility];  Service: Ophthalmology;  Laterality: Right;  CDE: 6.81  . CHOLECYSTECTOMY  03/04/05   [facility], [facility]. Gangrenous cholecystitis complicated by abscess requiring percutaneous drainage. He also had common duct stones requiring ERCP and sphincterotomy.  . COLONOSCOPY  August 2005   Scattered sigmoid diverticulosis, splenic flexure polyp. Hyperplastic  . COLONOSCOPY  1997   3 cm tubular adenoma and a sending colon  . COLONOSCOPY  02/01/2011   Rourk-friable anal canal, hyperplastic rectal polyp  . COLONOSCOPY N/A 08/15/2013   Procedure: COLONOSCOPY;  Surgeon: Rogene Houston, MD;  [facility];  Service: Endoscopy;  Laterality: N/A;  100  . ERCP  03/02/05   [facility], Rourk. Normal-appearing biliary tree (gallbladder not image), status post sphincterotomy with recovery of small pieces of stone material, status post balloon occlusion cholangiogram.  . ERCP  01/07/2012   Procedure: ENDOSCOPIC RETROGRADE CHOLANGIOPANCREATOGRAPHY (ERCP);  Surgeon: Rogene Houston, MD;  [facility];  Service: Endoscopy;  Laterality: N/A;  possible biliary stenting  . ERCP  02/21/2012   Procedure: ENDOSCOPIC RETROGRADE CHOLANGIOPANCREATOGRAPHY (ERCP);  Surgeon: Rogene Houston, MD;  [facility];  Service: Endoscopy;  Laterality: N/A;  common bile duct stone and clip removed  . ERCP N/A 05/18/2012   Procedure: ENDOSCOPIC RETROGRADE CHOLANGIOPANCREATOGRAPHY (ERCP);  Surgeon: Rogene Houston, MD;  [facility];  Service: Endoscopy;  Laterality: N/A;  stone and debri removal  .  ESOPHAGOGASTRODUODENOSCOPY  August 2005   Erosive esophagitis and Schatzki ring, small hiatal hernia  . ESOPHAGOGASTRODUODENOSCOPY  01/2008   Mild reflux esophagitis, small hiatal hernia  . ESOPHAGOGASTRODUODENOSCOPY  02/01/2011   Rourk-erosive reflux esophagitis, small hiatal hernia  . ESOPHAGOGASTRODUODENOSCOPY  10/26/2011   Dr. Eli Phillips gastric submucosal petechia (bx-benign ulceration), juxta ampullary duodenal diverticulum and some mucosal edema involving 1st/2nd portion of duodenum with superficial erosions (bx-superficial ulceration/benign)  . ESOPHAGOGASTRODUODENOSCOPY  02/21/2012   Procedure: ESOPHAGOGASTRODUODENOSCOPY (EGD);  Surgeon: Rogene Houston, MD;  [facility];  Service: Endoscopy;  Laterality: N/A;  . EYE SURGERY    . Incomplete colonoscopy  December 2009   Left-sided diverticula, mid descending colon polyp, due to recurrent looping and redundancy exam was incomplete. It was felt that the mid colon was reached. Followup barium enema showed colon interposition between the liver and the diaphragm, redundant sigmoid colon but no colon mass or polyp identified.. Pathology revealed tubular adenoma.  . SPHINCTEROTOMY  01/07/2012   Procedure: SPHINCTEROTOMY;  Surgeon: Rogene Houston, MD;  [facility];  Service: Endoscopy;  Laterality: N/A;  Extended  . SPYGLASS CHOLANGIOSCOPY  02/21/2012   Procedure: SPYGLASS CHOLANGIOSCOPY;  Surgeon: Rogene Houston, MD;  [facility];  Service: Endoscopy;  Laterality: N/A;  . SPYGLASS CHOLANGIOSCOPY N/A 05/18/2012   Procedure: SPYGLASS CHOLANGIOSCOPY;  Surgeon: Rogene Houston, MD;  [facility];  Service: Endoscopy;  Laterality: N/A;  . TOTAL KNEE ARTHROPLASTY  03/21/2012   Procedure: TOTAL KNEE ARTHROPLASTY;  Surgeon: [name], MD;  Location: [facility];  Service: Orthopedics;  Laterality: Right;  right knee arthroplasty   Social History   Occupational History  . Occupation: Retired Chemical engineer: RETIRED    Tobacco Use  . Smoking status: Never Smoker  . Smokeless tobacco: Never Used  Substance and Sexual Activity  . Alcohol use: No    Alcohol/week: 0.0 standard drinks  . Drug use: No  . Sexual activity: Yes    Birth control/protection: None     Garald Balding, MD   Note - This record has been created using [employer].  Chart creation errors have been sought, but may not always  have been located. Such creation errors do not reflect on  the standard of medical care.

## 2017-11-22 ENCOUNTER — Ambulatory Visit (INDEPENDENT_AMBULATORY_CARE_PROVIDER_SITE_OTHER): Payer: PPO | Admitting: Orthopaedic Surgery

## 2017-11-22 ENCOUNTER — Encounter (INDEPENDENT_AMBULATORY_CARE_PROVIDER_SITE_OTHER): Payer: Self-pay | Admitting: Orthopaedic Surgery

## 2017-11-22 DIAGNOSIS — M1712 Unilateral primary osteoarthritis, left knee: Secondary | ICD-10-CM

## 2017-11-22 DIAGNOSIS — K5752 Diverticulitis of both small and large intestine without perforation or abscess without bleeding: Secondary | ICD-10-CM | POA: Diagnosis not present

## 2017-11-22 DIAGNOSIS — Z6832 Body mass index (BMI) 32.0-32.9, adult: Secondary | ICD-10-CM | POA: Diagnosis not present

## 2017-11-22 DIAGNOSIS — Z0000 Encounter for general adult medical examination without abnormal findings: Secondary | ICD-10-CM | POA: Diagnosis not present

## 2017-11-22 NOTE — Progress Notes (Signed)
Office Visit Note   Patient: [name]           Date of Birth: 1940-04-29           MRN: [id number] Visit Date: 11/22/2017              Requested by: Celene Squibb, MD [place], [place] [address] PCP: Celene Squibb, MD   Assessment & Plan: Visit Diagnoses:  1. Unilateral primary osteoarthritis, left knee     Plan:  Supartz injection left knee.  Return next week for third and final injection  Follow-Up Instructions: Return in about 1 week (around 11/29/2017).   Orders:  No orders of the defined types were placed in this encounter.  No orders of the defined types were placed in this encounter.     Procedures: No procedures performed   Clinical Data: No additional findings.   Subjective: No chief complaint on file. First Supartz injection left knee last week.  [name] believes that it "made a difference".  No problems related  HPI  Review of Systems   Objective: Vital Signs: There were no vitals taken for this visit.  Physical Exam  Ortho Exam left knee not hot warm or red.  No effusion  Specialty Comments:  No specialty comments available.  Imaging: No results found.   PMFS History: Patient Active Problem List   Diagnosis Date Noted  . Unilateral primary osteoarthritis, left knee 11/01/2017  . Abnormal cardiac function test 09/22/2015  . Chronic renal disease, stage III ([place]) 09/22/2015  . Family history of coronary artery disease in mother 09/22/2015  . Bifascicular block 09/22/2015  . Hypertrophy of breast 11/26/2014  . Thoracic kyphosis 10/28/2013  . Hemiplegia affecting dominant side, late effect of cerebrovascular disease 10/28/2013  . Postoperative shock 10/28/2013  . Acute diastolic congestive heart failure ([place]) 10/28/2013  . Personal history of colonic polyps 07/23/2013  . Osteoarthritis of right knee 03/22/2012  . Common bile duct (CBD) stricture 01/16/2012  . Epigastric pain 01/19/2011  . Hx of adenomatous  colonic polyps 01/19/2011  . Constipation 01/19/2011  . Change in bowel function 01/19/2011  . Rectal bleeding 01/19/2011  . Thrombocytopenia ([place]) 01/19/2011   Past Medical History:  Diagnosis Date  . Arthritis   . Chronic back pain   . Complication of anesthesia    "hard to wake up after back surgery"  . GERD (gastroesophageal reflux disease)   . Helicobacter pylori gastritis    Treated  . Hx of adenomatous colonic polyps   . IBS (irritable bowel syndrome)   . Numbness    left leg after back surgery  . Prostate CA ([place])    seed implants  . Renal insufficiency   . Schatzki's ring    History of    Family History  Problem Relation Age of Onset  . Diabetes Mother   . Coronary artery disease Mother   . Coronary artery disease Father   . Diabetes Father   . Stomach cancer Father   . Deep vein thrombosis Daughter   . Hypotension Neg Hx   . Anesthesia problems Neg Hx   . Malignant hyperthermia Neg Hx   . Pseudochol deficiency Neg Hx   . Colon cancer Neg Hx     Past Surgical History:  Procedure Laterality Date  . APPENDECTOMY  age 36  . BACK SURGERY  02/08   [place]  . BACK SURGERY  09/24/04   lumbar, mcmh  . BACK SURGERY  09/18/02   [place]  . BILIARY STENT PLACEMENT  01/07/2012   Procedure: BILIARY STENT PLACEMENT;  Surgeon: Rogene Houston, MD;  [facility];  Service: Endoscopy;  Laterality: N/A;  . BILIARY STENT PLACEMENT  02/21/2012   Procedure: BILIARY STENT PLACEMENT;  Surgeon: Rogene Houston, MD;  [facility];  Service: Endoscopy;  Laterality: N/A;  Biliary Stent Replacement  . CATARACT EXTRACTION W/PHACO  11/22/2010   Procedure: CATARACT EXTRACTION PHACO AND INTRAOCULAR LENS PLACEMENT (IOC);  Surgeon: Williams Che;  [facility]: [facility];  Service: Ophthalmology;  Laterality: Right;  CDE: 6.81  . CHOLECYSTECTOMY  03/04/05   [facility], [facility]. Gangrenous cholecystitis complicated by abscess requiring percutaneous drainage. He also had common duct stones requiring  ERCP and sphincterotomy.  . COLONOSCOPY  August 2005   Scattered sigmoid diverticulosis, splenic flexure polyp. Hyperplastic  . COLONOSCOPY  1997   3 cm tubular adenoma and a sending colon  . COLONOSCOPY  02/01/2011   Rourk-friable anal canal, hyperplastic rectal polyp  . COLONOSCOPY N/A 08/15/2013   Procedure: COLONOSCOPY;  Surgeon: Rogene Houston, MD;  [facility];  Service: Endoscopy;  Laterality: N/A;  100  . ERCP  03/02/05   [facility], Rourk. Normal-appearing biliary tree (gallbladder not image), status post sphincterotomy with recovery of small pieces of stone material, status post balloon occlusion cholangiogram.  . ERCP  01/07/2012   Procedure: ENDOSCOPIC RETROGRADE CHOLANGIOPANCREATOGRAPHY (ERCP);  Surgeon: Rogene Houston, MD;  [facility];  Service: Endoscopy;  Laterality: N/A;  possible biliary stenting  . ERCP  02/21/2012   Procedure: ENDOSCOPIC RETROGRADE CHOLANGIOPANCREATOGRAPHY (ERCP);  Surgeon: Rogene Houston, MD;  [facility];  Service: Endoscopy;  Laterality: N/A;  common bile duct stone and clip removed  . ERCP N/A 05/18/2012   Procedure: ENDOSCOPIC RETROGRADE CHOLANGIOPANCREATOGRAPHY (ERCP);  Surgeon: Rogene Houston, MD;  Location[facility];  Service: Endoscopy;  Laterality: N/A;  stone and debri removal  . ESOPHAGOGASTRODUODENOSCOPY  August 2005   Erosive esophagitis and Schatzki ring, small hiatal hernia  . ESOPHAGOGASTRODUODENOSCOPY  01/2008   Mild reflux esophagitis, small hiatal hernia  . ESOPHAGOGASTRODUODENOSCOPY  02/01/2011   Rourk-erosive reflux esophagitis, small hiatal hernia  . ESOPHAGOGASTRODUODENOSCOPY  10/26/2011   Dr. Eli Phillips gastric submucosal petechia (bx-benign ulceration), juxta ampullary duodenal diverticulum and some mucosal edema involving 1st/2nd portion of duodenum with superficial erosions (bx-superficial ulceration/benign)  . ESOPHAGOGASTRODUODENOSCOPY  02/21/2012   Procedure: ESOPHAGOGASTRODUODENOSCOPY (EGD);  Surgeon:  Rogene Houston, MD;  [facility];  Service: Endoscopy;  Laterality: N/A;  . EYE SURGERY    . Incomplete colonoscopy  December 2009   Left-sided diverticula, mid descending colon polyp, due to recurrent looping and redundancy exam was incomplete. It was felt that the mid colon was reached. Followup barium enema showed colon interposition between the liver and the diaphragm, redundant sigmoid colon but no colon mass or polyp identified.. Pathology revealed tubular adenoma.  . SPHINCTEROTOMY  01/07/2012   Procedure: SPHINCTEROTOMY;  Surgeon: Rogene Houston, MD;  [facility];  Service: Endoscopy;  Laterality: N/A;  Extended  . SPYGLASS CHOLANGIOSCOPY  02/21/2012   Procedure: SPYGLASS CHOLANGIOSCOPY;  Surgeon: Rogene Houston, MD;  Location[facility];  Service: Endoscopy;  Laterality: N/A;  . SPYGLASS CHOLANGIOSCOPY N/A 05/18/2012   Procedure: SPYGLASS CHOLANGIOSCOPY;  Surgeon: Rogene Houston, MD;  Location[facility];  Service: Endoscopy;  Laterality: N/A;  . TOTAL KNEE ARTHROPLASTY  03/21/2012   Procedure: TOTAL KNEE ARTHROPLASTY;  Surgeon: [name], MD;  Location: [facility];  Service: Orthopedics;  Laterality: Right;  right knee arthroplasty   Social History   Occupational History  . Occupation: Retired Chemical engineer: RETIRED  Tobacco Use  . Smoking status: Never Smoker  . Smokeless tobacco: Never Used  Substance and Sexual Activity  . Alcohol use: No    Alcohol/week: 0.0 standard drinks  . Drug use: No  . Sexual activity: Yes    Birth control/protection: None     Garald Balding, MD   Note - This record has been created using [employer].  Chart creation errors have been sought, but may not always  have been located. Such creation errors do not reflect on  the standard of medical care.

## 2017-11-29 ENCOUNTER — Encounter (INDEPENDENT_AMBULATORY_CARE_PROVIDER_SITE_OTHER): Payer: Self-pay | Admitting: Orthopaedic Surgery

## 2017-11-29 ENCOUNTER — Ambulatory Visit (INDEPENDENT_AMBULATORY_CARE_PROVIDER_SITE_OTHER): Payer: PPO | Admitting: Orthopaedic Surgery

## 2017-11-29 DIAGNOSIS — M1712 Unilateral primary osteoarthritis, left knee: Secondary | ICD-10-CM | POA: Diagnosis not present

## 2017-11-29 MED ORDER — SODIUM HYALURONATE (VISCOSUP) 25 MG/2.5ML IX SOSY
25.0000 mg | PREFILLED_SYRINGE | INTRA_ARTICULAR | Status: AC | PRN
  Administered 2017-11-29: 25 mg via INTRA_ARTICULAR

## 2017-11-29 NOTE — Progress Notes (Signed)
Office Visit Note   Patient: [name]           Date of Birth: 1941/01/08           MRN: [id number] Visit Date: 11/29/2017              Requested by: Celene Squibb, MD [place], [place] [address] PCP: Celene Squibb, MD   Assessment & Plan: Visit Diagnoses:  1. Unilateral primary osteoarthritis, left knee     Plan: 3rd supartz inj left knee  Follow-Up Instructions: Return if symptoms worsen or fail to improve.   Orders:  No orders of the defined types were placed in this encounter.  No orders of the defined types were placed in this encounter.     Procedures: Large Joint Inj: L knee on 11/29/2017 12:24 PM Indications: pain and joint swelling Details: 25 G 1.5 in needle, anteromedial approach  Arthrogram: No  Medications: 25 mg Sodium Hyaluronate 25 MG/2.5ML Outcome: tolerated well, no immediate complications Procedure, treatment alternatives, risks and benefits explained, specific risks discussed. Consent was given by the patient. Immediately prior to procedure a time out was called to verify the correct patient, procedure, equipment, support staff and site/side marked as required. Patient was prepped and draped in the usual sterile fashion.       Clinical Data: No additional findings.   Subjective: No chief complaint on file. good response to 2 prior inj.  HPI  Review of Systems   Objective: Vital Signs: There were no vitals taken for this visit.  Physical Exam  Ortho Examleft knee not hot, warm, or swollen  Specialty Comments:  No specialty comments available.  Imaging: No results found.   PMFS History: Patient Active Problem List   Diagnosis Date Noted  . Unilateral primary osteoarthritis, left knee 11/01/2017  . Abnormal cardiac function test 09/22/2015  . Chronic renal disease, stage III ([place]) 09/22/2015  . Family history of coronary artery disease in mother 09/22/2015  . Bifascicular block 09/22/2015  . Hypertrophy of  breast 11/26/2014  . Thoracic kyphosis 10/28/2013  . Hemiplegia affecting dominant side, late effect of cerebrovascular disease 10/28/2013  . Postoperative shock 10/28/2013  . Acute diastolic congestive heart failure ([place]) 10/28/2013  . Personal history of colonic polyps 07/23/2013  . Osteoarthritis of right knee 03/22/2012  . Common bile duct (CBD) stricture 01/16/2012  . Epigastric pain 01/19/2011  . Hx of adenomatous colonic polyps 01/19/2011  . Constipation 01/19/2011  . Change in bowel function 01/19/2011  . Rectal bleeding 01/19/2011  . Thrombocytopenia ([place]) 01/19/2011   Past Medical History:  Diagnosis Date  . Arthritis   . Chronic back pain   . Complication of anesthesia    "hard to wake up after back surgery"  . GERD (gastroesophageal reflux disease)   . Helicobacter pylori gastritis    Treated  . Hx of adenomatous colonic polyps   . IBS (irritable bowel syndrome)   . Numbness    left leg after back surgery  . Prostate CA ([place])    seed implants  . Renal insufficiency   . Schatzki's ring    History of    Family History  Problem Relation Age of Onset  . Diabetes Mother   . Coronary artery disease Mother   . Coronary artery disease Father   . Diabetes Father   . Stomach cancer Father   . Deep vein thrombosis Daughter   . Hypotension Neg Hx   . Anesthesia problems  Neg Hx   . Malignant hyperthermia Neg Hx   . Pseudochol deficiency Neg Hx   . Colon cancer Neg Hx     Past Surgical History:  Procedure Laterality Date  . APPENDECTOMY  age 60  . BACK SURGERY  02/08   [place]  . BACK SURGERY  09/24/04   lumbar, mcmh  . BACK SURGERY  09/18/02   [place]  . BILIARY STENT PLACEMENT  01/07/2012   Procedure: BILIARY STENT PLACEMENT;  Surgeon: Rogene Houston, MD;  [facility];  Service: Endoscopy;  Laterality: N/A;  . BILIARY STENT PLACEMENT  02/21/2012   Procedure: BILIARY STENT PLACEMENT;  Surgeon: Rogene Houston, MD;  [facility];  Service: Endoscopy;   Laterality: N/A;  Biliary Stent Replacement  . CATARACT EXTRACTION W/PHACO  11/22/2010   Procedure: CATARACT EXTRACTION PHACO AND INTRAOCULAR LENS PLACEMENT (IOC);  Surgeon: Williams Che;  [facility]: [facility];  Service: Ophthalmology;  Laterality: Right;  CDE: 6.81  . CHOLECYSTECTOMY  03/04/05   [facility], [facility]. Gangrenous cholecystitis complicated by abscess requiring percutaneous drainage. He also had common duct stones requiring ERCP and sphincterotomy.  . COLONOSCOPY  August 2005   Scattered sigmoid diverticulosis, splenic flexure polyp. Hyperplastic  . COLONOSCOPY  1997   3 cm tubular adenoma and a sending colon  . COLONOSCOPY  02/01/2011   Rourk-friable anal canal, hyperplastic rectal polyp  . COLONOSCOPY N/A 08/15/2013   Procedure: COLONOSCOPY;  Surgeon: Rogene Houston, MD;  [facility];  Service: Endoscopy;  Laterality: N/A;  100  . ERCP  03/02/05   [facility], Rourk. Normal-appearing biliary tree (gallbladder not image), status post sphincterotomy with recovery of small pieces of stone material, status post balloon occlusion cholangiogram.  . ERCP  01/07/2012   Procedure: ENDOSCOPIC RETROGRADE CHOLANGIOPANCREATOGRAPHY (ERCP);  Surgeon: Rogene Houston, MD;  [facility];  Service: Endoscopy;  Laterality: N/A;  possible biliary stenting  . ERCP  02/21/2012   Procedure: ENDOSCOPIC RETROGRADE CHOLANGIOPANCREATOGRAPHY (ERCP);  Surgeon: Rogene Houston, MD;  [facility];  Service: Endoscopy;  Laterality: N/A;  common bile duct stone and clip removed  . ERCP N/A 05/18/2012   Procedure: ENDOSCOPIC RETROGRADE CHOLANGIOPANCREATOGRAPHY (ERCP);  Surgeon: Rogene Houston, MD;  [facility]: [facility];  Service: Endoscopy;  Laterality: N/A;  stone and debri removal  . ESOPHAGOGASTRODUODENOSCOPY  August 2005   Erosive esophagitis and Schatzki ring, small hiatal hernia  . ESOPHAGOGASTRODUODENOSCOPY  01/2008   Mild reflux esophagitis, small hiatal hernia  . ESOPHAGOGASTRODUODENOSCOPY  02/01/2011     Rourk-erosive reflux esophagitis, small hiatal hernia  . ESOPHAGOGASTRODUODENOSCOPY  10/26/2011   Dr. Eli Phillips gastric submucosal petechia (bx-benign ulceration), juxta ampullary duodenal diverticulum and some mucosal edema involving 1st/2nd portion of duodenum with superficial erosions (bx-superficial ulceration/benign)  . ESOPHAGOGASTRODUODENOSCOPY  02/21/2012   Procedure: ESOPHAGOGASTRODUODENOSCOPY (EGD);  Surgeon: Rogene Houston, MD;  [facility];  Service: Endoscopy;  Laterality: N/A;  . EYE SURGERY    . Incomplete colonoscopy  December 2009   Left-sided diverticula, mid descending colon polyp, due to recurrent looping and redundancy exam was incomplete. It was felt that the mid colon was reached. Followup barium enema showed colon interposition between the liver and the diaphragm, redundant sigmoid colon but no colon mass or polyp identified.. Pathology revealed tubular adenoma.  . SPHINCTEROTOMY  01/07/2012   Procedure: SPHINCTEROTOMY;  Surgeon: Rogene Houston, MD;  [facility];  Service: Endoscopy;  Laterality: N/A;  Extended  . SPYGLASS CHOLANGIOSCOPY  02/21/2012   Procedure:  SPYGLASS CHOLANGIOSCOPY;  Surgeon: Rogene Houston, MD;  [facility];  Service: Endoscopy;  Laterality: N/A;  . SPYGLASS CHOLANGIOSCOPY N/A 05/18/2012   Procedure: SPYGLASS CHOLANGIOSCOPY;  Surgeon: Rogene Houston, MD;  [facility];  Service: Endoscopy;  Laterality: N/A;  . TOTAL KNEE ARTHROPLASTY  03/21/2012   Procedure: TOTAL KNEE ARTHROPLASTY;  Surgeon: [name], MD;  Location: [facility];  Service: Orthopedics;  Laterality: Right;  right knee arthroplasty   Social History   Occupational History  . Occupation: Retired Chemical engineer: RETIRED  Tobacco Use  . Smoking status: Never Smoker  . Smokeless tobacco: Never Used  Substance and Sexual Activity  . Alcohol use: No    Alcohol/week: 0.0 standard drinks  . Drug use: No  . Sexual activity: Yes    Birth  control/protection: None     Garald Balding, MD   Note - This record has been created using [employer].  Chart creation errors have been sought, but may not always  have been located. Such creation errors do not reflect on  the standard of medical care.

## 2017-12-22 DIAGNOSIS — M109 Gout, unspecified: Secondary | ICD-10-CM | POA: Diagnosis not present

## 2017-12-22 DIAGNOSIS — M79671 Pain in right foot: Secondary | ICD-10-CM | POA: Diagnosis not present

## 2017-12-22 DIAGNOSIS — G5791 Unspecified mononeuropathy of right lower limb: Secondary | ICD-10-CM | POA: Diagnosis not present

## 2017-12-26 DIAGNOSIS — M109 Gout, unspecified: Secondary | ICD-10-CM | POA: Diagnosis not present

## 2017-12-29 ENCOUNTER — Ambulatory Visit: Payer: PPO | Admitting: Urology

## 2017-12-29 DIAGNOSIS — R351 Nocturia: Secondary | ICD-10-CM | POA: Diagnosis not present

## 2017-12-29 DIAGNOSIS — R3121 Asymptomatic microscopic hematuria: Secondary | ICD-10-CM

## 2017-12-29 DIAGNOSIS — Z8546 Personal history of malignant neoplasm of prostate: Secondary | ICD-10-CM | POA: Diagnosis not present

## 2017-12-29 DIAGNOSIS — N3941 Urge incontinence: Secondary | ICD-10-CM | POA: Diagnosis not present

## 2018-01-05 DIAGNOSIS — Z8546 Personal history of malignant neoplasm of prostate: Secondary | ICD-10-CM | POA: Diagnosis not present

## 2018-01-05 DIAGNOSIS — E785 Hyperlipidemia, unspecified: Secondary | ICD-10-CM | POA: Diagnosis not present

## 2018-01-05 DIAGNOSIS — E782 Mixed hyperlipidemia: Secondary | ICD-10-CM | POA: Diagnosis not present

## 2018-01-05 DIAGNOSIS — R7301 Impaired fasting glucose: Secondary | ICD-10-CM | POA: Diagnosis not present

## 2018-01-05 DIAGNOSIS — I10 Essential (primary) hypertension: Secondary | ICD-10-CM | POA: Diagnosis not present

## 2018-01-11 DIAGNOSIS — N183 Chronic kidney disease, stage 3 (moderate): Secondary | ICD-10-CM | POA: Diagnosis not present

## 2018-01-11 DIAGNOSIS — I129 Hypertensive chronic kidney disease with stage 1 through stage 4 chronic kidney disease, or unspecified chronic kidney disease: Secondary | ICD-10-CM | POA: Diagnosis not present

## 2018-01-11 DIAGNOSIS — I2510 Atherosclerotic heart disease of native coronary artery without angina pectoris: Secondary | ICD-10-CM | POA: Diagnosis not present

## 2018-01-11 DIAGNOSIS — E6609 Other obesity due to excess calories: Secondary | ICD-10-CM | POA: Diagnosis not present

## 2018-01-11 DIAGNOSIS — Z23 Encounter for immunization: Secondary | ICD-10-CM | POA: Diagnosis not present

## 2018-01-11 DIAGNOSIS — M1712 Unilateral primary osteoarthritis, left knee: Secondary | ICD-10-CM | POA: Diagnosis not present

## 2018-01-11 DIAGNOSIS — Z0000 Encounter for general adult medical examination without abnormal findings: Secondary | ICD-10-CM | POA: Diagnosis not present

## 2018-01-11 DIAGNOSIS — R7301 Impaired fasting glucose: Secondary | ICD-10-CM | POA: Diagnosis not present

## 2018-01-11 DIAGNOSIS — Z8546 Personal history of malignant neoplasm of prostate: Secondary | ICD-10-CM | POA: Diagnosis not present

## 2018-01-11 DIAGNOSIS — E782 Mixed hyperlipidemia: Secondary | ICD-10-CM | POA: Diagnosis not present

## 2018-01-12 DIAGNOSIS — E782 Mixed hyperlipidemia: Secondary | ICD-10-CM | POA: Diagnosis not present

## 2018-01-12 DIAGNOSIS — I10 Essential (primary) hypertension: Secondary | ICD-10-CM | POA: Diagnosis not present

## 2018-02-19 DIAGNOSIS — J019 Acute sinusitis, unspecified: Secondary | ICD-10-CM | POA: Diagnosis not present

## 2018-02-19 DIAGNOSIS — J029 Acute pharyngitis, unspecified: Secondary | ICD-10-CM | POA: Diagnosis not present

## 2018-02-23 DIAGNOSIS — J029 Acute pharyngitis, unspecified: Secondary | ICD-10-CM | POA: Diagnosis not present

## 2018-02-23 DIAGNOSIS — M5481 Occipital neuralgia: Secondary | ICD-10-CM | POA: Diagnosis not present

## 2018-02-26 DIAGNOSIS — M5481 Occipital neuralgia: Secondary | ICD-10-CM | POA: Diagnosis not present

## 2018-02-26 DIAGNOSIS — J029 Acute pharyngitis, unspecified: Secondary | ICD-10-CM | POA: Diagnosis not present

## 2018-03-07 DIAGNOSIS — M1712 Unilateral primary osteoarthritis, left knee: Secondary | ICD-10-CM | POA: Diagnosis not present

## 2018-03-27 DIAGNOSIS — M1712 Unilateral primary osteoarthritis, left knee: Secondary | ICD-10-CM | POA: Diagnosis not present

## 2018-03-31 DIAGNOSIS — R109 Unspecified abdominal pain: Secondary | ICD-10-CM | POA: Diagnosis not present

## 2018-04-02 DIAGNOSIS — R319 Hematuria, unspecified: Secondary | ICD-10-CM | POA: Diagnosis not present

## 2018-04-02 DIAGNOSIS — N183 Chronic kidney disease, stage 3 (moderate): Secondary | ICD-10-CM | POA: Diagnosis not present

## 2018-04-02 DIAGNOSIS — J019 Acute sinusitis, unspecified: Secondary | ICD-10-CM | POA: Diagnosis not present

## 2018-04-02 DIAGNOSIS — R079 Chest pain, unspecified: Secondary | ICD-10-CM | POA: Diagnosis not present

## 2018-04-02 DIAGNOSIS — Z8546 Personal history of malignant neoplasm of prostate: Secondary | ICD-10-CM | POA: Diagnosis not present

## 2018-04-02 DIAGNOSIS — R42 Dizziness and giddiness: Secondary | ICD-10-CM | POA: Diagnosis not present

## 2018-04-02 DIAGNOSIS — J029 Acute pharyngitis, unspecified: Secondary | ICD-10-CM | POA: Diagnosis not present

## 2018-04-02 DIAGNOSIS — I10 Essential (primary) hypertension: Secondary | ICD-10-CM | POA: Diagnosis not present

## 2018-04-02 DIAGNOSIS — R944 Abnormal results of kidney function studies: Secondary | ICD-10-CM | POA: Diagnosis not present

## 2018-04-02 DIAGNOSIS — R7301 Impaired fasting glucose: Secondary | ICD-10-CM | POA: Diagnosis not present

## 2018-04-02 DIAGNOSIS — K5752 Diverticulitis of both small and large intestine without perforation or abscess without bleeding: Secondary | ICD-10-CM | POA: Diagnosis not present

## 2018-04-02 DIAGNOSIS — N400 Enlarged prostate without lower urinary tract symptoms: Secondary | ICD-10-CM | POA: Diagnosis not present

## 2018-04-03 DIAGNOSIS — M1712 Unilateral primary osteoarthritis, left knee: Secondary | ICD-10-CM | POA: Diagnosis not present

## 2018-04-09 ENCOUNTER — Ambulatory Visit (HOSPITAL_COMMUNITY)
Admission: RE | Admit: 2018-04-09 | Discharge: 2018-04-09 | Disposition: A | Discharge status: Home / Self Care | Payer: PPO | Source: Ambulatory Visit | Attending: Internal Medicine | Admitting: Internal Medicine

## 2018-04-09 ENCOUNTER — Encounter (INDEPENDENT_AMBULATORY_CARE_PROVIDER_SITE_OTHER): Payer: Self-pay | Admitting: *Deleted

## 2018-04-09 ENCOUNTER — Other Ambulatory Visit (HOSPITAL_COMMUNITY): Payer: Self-pay | Admitting: Internal Medicine

## 2018-04-09 DIAGNOSIS — R1030 Lower abdominal pain, unspecified: Secondary | ICD-10-CM | POA: Insufficient documentation

## 2018-04-09 DIAGNOSIS — R1032 Left lower quadrant pain: Secondary | ICD-10-CM | POA: Diagnosis not present

## 2018-04-09 MED ORDER — IOPAMIDOL (ISOVUE-300) INJECTION 61%: 50.0000 mL | Freq: Once | INTRAVENOUS | Status: DC | PRN

## 2018-04-24 DIAGNOSIS — M25462 Effusion, left knee: Secondary | ICD-10-CM | POA: Diagnosis not present

## 2018-04-24 DIAGNOSIS — M1712 Unilateral primary osteoarthritis, left knee: Secondary | ICD-10-CM | POA: Diagnosis not present

## 2018-04-24 DIAGNOSIS — M25562 Pain in left knee: Secondary | ICD-10-CM | POA: Diagnosis not present

## 2018-04-30 ENCOUNTER — Encounter (INDEPENDENT_AMBULATORY_CARE_PROVIDER_SITE_OTHER): Payer: Self-pay | Admitting: Internal Medicine

## 2018-04-30 ENCOUNTER — Ambulatory Visit (INDEPENDENT_AMBULATORY_CARE_PROVIDER_SITE_OTHER): Payer: PPO | Admitting: Internal Medicine

## 2018-04-30 VITALS — BP 144/61 | HR 77 | Temp 98.0°F | Ht 75.0 in | Wt 255.0 lb

## 2018-04-30 DIAGNOSIS — R1030 Lower abdominal pain, unspecified: Secondary | ICD-10-CM | POA: Diagnosis not present

## 2018-04-30 LAB — HEPATIC FUNCTION PANEL
AG Ratio: 1.5 (calc) (ref 1.0–2.5)
ALT: 19 U/L (ref 9–46)
AST: 25 U/L (ref 10–35)
Albumin: 4.2 g/dL (ref 3.6–5.1)
Alkaline phosphatase (APISO): 65 U/L (ref 35–144)
Bilirubin, Direct: 0.1 mg/dL (ref 0.0–0.2)
Globulin: 2.8 g/dL (calc) (ref 1.9–3.7)
Indirect Bilirubin: 0.7 mg/dL (calc) (ref 0.2–1.2)
Total Bilirubin: 0.8 mg/dL (ref 0.2–1.2)
Total Protein: 7 g/dL (ref 6.1–8.1)

## 2018-04-30 LAB — CBC WITH DIFFERENTIAL/PLATELET
Absolute Monocytes: 518 cells/uL (ref 200–950)
Basophils Absolute: 32 cells/uL (ref 0–200)
Basophils Relative: 0.7 %
Eosinophils Absolute: 108 cells/uL (ref 15–500)
Eosinophils Relative: 2.4 %
HCT: 48.2 % (ref 38.5–50.0)
Hemoglobin: 16.8 g/dL (ref 13.2–17.1)
LYMPHS ABS: 927 {cells}/uL (ref 850–3900)
MCH: 31.9 pg (ref 27.0–33.0)
MCHC: 34.9 g/dL (ref 32.0–36.0)
MCV: 91.6 fL (ref 80.0–100.0)
MPV: 9 fL (ref 7.5–12.5)
Monocytes Relative: 11.5 %
Neutro Abs: 2916 cells/uL (ref 1500–7800)
Neutrophils Relative %: 64.8 %
Platelets: 165 10*3/uL (ref 140–400)
RBC: 5.26 10*6/uL (ref 4.20–5.80)
RDW: 12.8 % (ref 11.0–15.0)
Total Lymphocyte: 20.6 %
WBC: 4.5 10*3/uL (ref 3.8–10.8)

## 2018-04-30 NOTE — Patient Instructions (Signed)
Samples of Amitiza given to patient.

## 2018-04-30 NOTE — Progress Notes (Signed)
Subjective:    Patient ID: [name], male    DOB: Feb 04, 1941, 78 y.o.   MRN: [id number] Chronic pain for years (2 yrs).  HPI Presents today with c/o constipation.  Hx of same. Takes Miralax and sometimes he takes a stool softener OTC. Has tried Mag Citrate which did not help.  He has a BM every other day or once a day.  He c/o lower abdominal pain today and radiates into his upper abdomen. Symptoms for years. He also c/o upper abdominal pain. Says it hurts off and on.  He says the epigastric pain and lower abdominal pain keeps him up at night. He denies any GERD.  His last colonoscopy was in 2015 which was incomplete.  Eats oatmeal for breakfast.  No nausea or vomiting. No fever.   He underwent a CT scan which revealed: normal. Moderate bowel content identified thru out the colon which can be seen in constipation.    09/11/2013 Virtual colonoscopy: Incomplete colonoscopy.     IMPRESSION: 1. This virtual colonoscopy is somewhat suboptimal due to lack of adequate distension. However no clinically significant polypoid lesion is seen and no constricting lesion is noted. 2. Very elongated and tortuous colon. 3. Lipomatosis ileocecal valve.   08/15/2013 Colonoscopy: surveillance: hx adenomas.  Impression:  Examination performed to ascending colon but cecum could not be reached. Position confirmed by applying single resolution clip to mucosa of ascending colon. Scattered diverticuli involving sigmoid and descending colon. No evidence of recurrent polyps.   Review of Systems Past Medical History:  Diagnosis Date  . Arthritis   . Chronic back pain   . Complication of anesthesia    "hard to wake up after back surgery"  . GERD (gastroesophageal reflux disease)   . Helicobacter pylori gastritis    Treated  . Hx of adenomatous colonic polyps   . IBS (irritable bowel syndrome)   . Numbness    left leg after back surgery  . Prostate CA ([place])    seed implants  . Renal  insufficiency   . Schatzki's ring    History of    Past Surgical History:  Procedure Laterality Date  . APPENDECTOMY  age 89  . BACK SURGERY  02/08   [place]  . BACK SURGERY  09/24/04   l[facility], [facility]  . BACK SURGERY  09/18/02   [place]  . BILIARY STENT PLACEMENT  01/07/2012   Procedure: BILIARY STENT PLACEMENT;  Surgeon: Rogene Houston, MD;  Location: [facility];  Service: Endoscopy;  Laterality: N/A;  . BILIARY STENT PLACEMENT  02/21/2012   Procedure: BILIARY STENT PLACEMENT;  Surgeon: Rogene Houston, MD;  Location: [facility];  Service: Endoscopy;  Laterality: N/A;  Biliary Stent Replacement  . CATARACT EXTRACTION W/PHACO  11/22/2010   Procedure: CATARACT EXTRACTION PHACO AND INTRAOCULAR LENS PLACEMENT (IOC);  Surgeon: Williams Che;  Location: [facility];  Service: Ophthalmology;  Laterality: Right;  CDE: 6.81  . CHOLECYSTECTOMY  03/04/05   [facility], [facility]. Gangrenous cholecystitis complicated by abscess requiring percutaneous drainage. He also had common duct stones requiring ERCP and sphincterotomy.  . COLONOSCOPY  August 2005   Scattered sigmoid diverticulosis, splenic flexure polyp. Hyperplastic  . COLONOSCOPY  1997   3 cm tubular adenoma and a sending colon  . COLONOSCOPY  02/01/2011   Rourk-friable anal canal, hyperplastic rectal polyp  . COLONOSCOPY N/A 08/15/2013   Procedure: COLONOSCOPY;  Surgeon: Rogene Houston, MD;  [facility];  Service: Endoscopy;  Laterality: N/A;  100  .  ERCP  03/02/05   [facility]. Normal-appearing biliary tree (gallbladder not image), status post sphincterotomy with recovery of small pieces of stone material, status post balloon occlusion cholangiogram.  . ERCP  01/07/2012   Procedure: ENDOSCOPIC RETROGRADE CHOLANGIOPANCREATOGRAPHY (ERCP);  Surgeon: Rogene Houston, MD;  Location: [facility];  Service: Endoscopy;  Laterality: N/A;  possible biliary stenting  . ERCP  02/21/2012   Procedure: ENDOSCOPIC RETROGRADE CHOLANGIOPANCREATOGRAPHY (ERCP);  Surgeon:  Rogene Houston, MD;  Location: [facility];  Service: Endoscopy;  Laterality: N/A;  common bile duct stone and clip removed  . ERCP N/A 05/18/2012   Procedure: ENDOSCOPIC RETROGRADE CHOLANGIOPANCREATOGRAPHY (ERCP);  Surgeon: Rogene Houston, MD;  Location: [facility];  Service: Endoscopy;  Laterality: N/A;  stone and debri removal  . ESOPHAGOGASTRODUODENOSCOPY  August 2005   Erosive esophagitis and Schatzki ring, small hiatal hernia  . ESOPHAGOGASTRODUODENOSCOPY  01/2008   Mild reflux esophagitis, small hiatal hernia  . ESOPHAGOGASTRODUODENOSCOPY  02/01/2011   Rourk-erosive reflux esophagitis, small hiatal hernia  . ESOPHAGOGASTRODUODENOSCOPY  10/26/2011   Dr. Eli Phillips gastric submucosal petechia (bx-benign ulceration), juxta ampullary duodenal diverticulum and some mucosal edema involving 1st/2nd portion of duodenum with superficial erosions (bx-superficial ulceration/benign)  . ESOPHAGOGASTRODUODENOSCOPY  02/21/2012   Procedure: ESOPHAGOGASTRODUODENOSCOPY (EGD);  Surgeon: Rogene Houston, MD;  Location: [facility];  Service: Endoscopy;  Laterality: N/A;  . EYE SURGERY    . Incomplete colonoscopy  December 2009   Left-sided diverticula, mid descending colon polyp, due to recurrent looping and redundancy exam was incomplete. It was felt that the mid colon was reached. Followup barium enema showed colon interposition between the liver and the diaphragm, redundant sigmoid colon but no colon mass or polyp identified.. Pathology revealed tubular adenoma.  . SPHINCTEROTOMY  01/07/2012   Procedure: SPHINCTEROTOMY;  Surgeon: Rogene Houston, MD;  [facility]: [facility];  Service: Endoscopy;  Laterality: N/A;  Extended  . SPYGLASS CHOLANGIOSCOPY  02/21/2012   Procedure: SPYGLASS CHOLANGIOSCOPY;  Surgeon: Rogene Houston, MD;  Location: [facility];  Service: Endoscopy;  Laterality: N/A;  . SPYGLASS CHOLANGIOSCOPY N/A 05/18/2012   Procedure: SPYGLASS CHOLANGIOSCOPY;  Surgeon: Rogene Houston, MD;  Location: [facility];   Service: Endoscopy;  Laterality: N/A;  . TOTAL KNEE ARTHROPLASTY  03/21/2012   Procedure: TOTAL KNEE ARTHROPLASTY;  Surgeon: [name], MD;  Location: [facility];  Service: Orthopedics;  Laterality: Right;  right knee arthroplasty    No Known Allergies  Current Outpatient Medications on File Prior to Visit  Medication Sig Dispense Refill  . amLODipine (NORVASC) 10 MG tablet Take 1 tablet (10 mg total) by mouth daily. 90 tablet 3  . aspirin EC 81 MG tablet Take 1 tablet (81 mg total) by mouth daily. 90 tablet 3  . polyethylene glycol (MIRALAX / GLYCOLAX) packet Take by mouth daily.    . pravastatin (PRAVACHOL) 10 MG tablet Take 10 mg by mouth daily.    . nitroGLYCERIN (NITROSTAT) 0.4 MG SL tablet Place 1 tablet (0.4 mg total) under the tongue every 5 (five) minutes as needed for chest pain. 25 tablet 2   No current facility-administered medications on file prior to visit.         Objective:   Physical Exam Blood pressure (!) 144/61, pulse 77, temperature 98 F (36.7 C), height 6\' 3"  (1.905 m), weight 255 lb (115.7 kg). Alert and oriented. Skin warm and dry. Oral mucosa is moist.   . Sclera anicteric, conjunctivae is pink. Thyroid not enlarged. No cervical lymphadenopathy. Lungs clear. Heart  regular rate and rhythm.  Abdomen is soft. Bowel sounds are positive. No hepatomegaly. No abdominal masses felt. Slight tenderness to deep palpation to lower abdomen.  No edema to lower extremities.         Assessment & Plan:  Lower abdominal pian and epigastric pain. CBC and Hepatic. I will discuss with Dr. Laural Golden.  Samples of Amitiza 66mcg BID given to patient. Stop the MIralax.

## 2018-05-01 ENCOUNTER — Telehealth (INDEPENDENT_AMBULATORY_CARE_PROVIDER_SITE_OTHER): Payer: Self-pay | Admitting: Internal Medicine

## 2018-05-01 ENCOUNTER — Other Ambulatory Visit (INDEPENDENT_AMBULATORY_CARE_PROVIDER_SITE_OTHER): Payer: Self-pay | Admitting: Internal Medicine

## 2018-05-01 DIAGNOSIS — D126 Benign neoplasm of colon, unspecified: Secondary | ICD-10-CM

## 2018-05-01 NOTE — Telephone Encounter (Signed)
[name], Colonoscopy. Order is in. Patient is aware.

## 2018-05-01 NOTE — Telephone Encounter (Signed)
TCS sch'd 07/05/18 at 1200, patient aware

## 2018-05-22 ENCOUNTER — Encounter (INDEPENDENT_AMBULATORY_CARE_PROVIDER_SITE_OTHER): Payer: Self-pay | Admitting: *Deleted

## 2018-05-22 ENCOUNTER — Telehealth (INDEPENDENT_AMBULATORY_CARE_PROVIDER_SITE_OTHER): Payer: Self-pay | Admitting: *Deleted

## 2018-05-22 MED ORDER — PEG 3350-KCL-NA BICARB-NACL 420 G PO SOLR: 4000.0000 mL | Freq: Once | ORAL | 0 refills | Status: AC

## 2018-05-22 NOTE — Telephone Encounter (Signed)
Patient needs trilyte 

## 2018-06-26 ENCOUNTER — Ambulatory Visit (INDEPENDENT_AMBULATORY_CARE_PROVIDER_SITE_OTHER): Payer: PPO | Admitting: Internal Medicine

## 2018-07-06 DIAGNOSIS — Z0000 Encounter for general adult medical examination without abnormal findings: Secondary | ICD-10-CM | POA: Diagnosis not present

## 2018-07-09 DIAGNOSIS — I10 Essential (primary) hypertension: Secondary | ICD-10-CM | POA: Diagnosis not present

## 2018-07-09 DIAGNOSIS — E782 Mixed hyperlipidemia: Secondary | ICD-10-CM | POA: Diagnosis not present

## 2018-07-09 DIAGNOSIS — E785 Hyperlipidemia, unspecified: Secondary | ICD-10-CM | POA: Diagnosis not present

## 2018-07-09 DIAGNOSIS — N183 Chronic kidney disease, stage 3 (moderate): Secondary | ICD-10-CM | POA: Diagnosis not present

## 2018-07-09 DIAGNOSIS — R7301 Impaired fasting glucose: Secondary | ICD-10-CM | POA: Diagnosis not present

## 2018-07-13 DIAGNOSIS — Z8546 Personal history of malignant neoplasm of prostate: Secondary | ICD-10-CM | POA: Diagnosis not present

## 2018-07-13 DIAGNOSIS — R7301 Impaired fasting glucose: Secondary | ICD-10-CM | POA: Diagnosis not present

## 2018-07-13 DIAGNOSIS — E782 Mixed hyperlipidemia: Secondary | ICD-10-CM | POA: Diagnosis not present

## 2018-07-13 DIAGNOSIS — I2510 Atherosclerotic heart disease of native coronary artery without angina pectoris: Secondary | ICD-10-CM | POA: Diagnosis not present

## 2018-07-13 DIAGNOSIS — M1712 Unilateral primary osteoarthritis, left knee: Secondary | ICD-10-CM | POA: Diagnosis not present

## 2018-07-13 DIAGNOSIS — N183 Chronic kidney disease, stage 3 (moderate): Secondary | ICD-10-CM | POA: Diagnosis not present

## 2018-07-13 DIAGNOSIS — I129 Hypertensive chronic kidney disease with stage 1 through stage 4 chronic kidney disease, or unspecified chronic kidney disease: Secondary | ICD-10-CM | POA: Diagnosis not present

## 2018-07-13 DIAGNOSIS — M25562 Pain in left knee: Secondary | ICD-10-CM | POA: Diagnosis not present

## 2018-07-13 DIAGNOSIS — E669 Obesity, unspecified: Secondary | ICD-10-CM | POA: Diagnosis not present

## 2018-07-23 ENCOUNTER — Encounter (INDEPENDENT_AMBULATORY_CARE_PROVIDER_SITE_OTHER): Payer: Self-pay | Admitting: *Deleted

## 2018-09-14 ENCOUNTER — Ambulatory Visit: Payer: PPO | Admitting: Urology

## 2018-09-18 ENCOUNTER — Other Ambulatory Visit: Payer: Self-pay

## 2018-09-18 ENCOUNTER — Other Ambulatory Visit (HOSPITAL_COMMUNITY)
Admission: RE | Admit: 2018-09-18 | Discharge: 2018-09-18 | Disposition: A | Discharge status: Home / Self Care | Payer: PPO | Source: Ambulatory Visit | Attending: Internal Medicine | Admitting: Internal Medicine

## 2018-09-18 DIAGNOSIS — Z20828 Contact with and (suspected) exposure to other viral communicable diseases: Secondary | ICD-10-CM | POA: Diagnosis not present

## 2018-09-18 LAB — SARS CORONAVIRUS 2 (TAT 6-24 HRS): SARS Coronavirus 2: NEGATIVE

## 2018-09-20 ENCOUNTER — Ambulatory Visit (HOSPITAL_COMMUNITY)
Admission: RE | Admit: 2018-09-20 | Discharge: 2018-09-20 | Disposition: A | Discharge status: Home / Self Care | Payer: PPO | Attending: Internal Medicine | Admitting: Internal Medicine

## 2018-09-20 ENCOUNTER — Encounter (HOSPITAL_COMMUNITY)
Admission: RE | Disposition: A | Discharge status: Home / Self Care | Payer: Self-pay | Source: Home / Self Care | Attending: Internal Medicine

## 2018-09-20 ENCOUNTER — Encounter (HOSPITAL_COMMUNITY): Payer: Self-pay | Admitting: *Deleted

## 2018-09-20 ENCOUNTER — Other Ambulatory Visit: Payer: Self-pay

## 2018-09-20 DIAGNOSIS — Z961 Presence of intraocular lens: Secondary | ICD-10-CM | POA: Insufficient documentation

## 2018-09-20 DIAGNOSIS — K581 Irritable bowel syndrome with constipation: Secondary | ICD-10-CM | POA: Insufficient documentation

## 2018-09-20 DIAGNOSIS — Z79899 Other long term (current) drug therapy: Secondary | ICD-10-CM | POA: Diagnosis not present

## 2018-09-20 DIAGNOSIS — K5730 Diverticulosis of large intestine without perforation or abscess without bleeding: Secondary | ICD-10-CM | POA: Diagnosis not present

## 2018-09-20 DIAGNOSIS — Z7982 Long term (current) use of aspirin: Secondary | ICD-10-CM | POA: Insufficient documentation

## 2018-09-20 DIAGNOSIS — Z96651 Presence of right artificial knee joint: Secondary | ICD-10-CM | POA: Diagnosis not present

## 2018-09-20 DIAGNOSIS — Z923 Personal history of irradiation: Secondary | ICD-10-CM | POA: Insufficient documentation

## 2018-09-20 DIAGNOSIS — D123 Benign neoplasm of transverse colon: Secondary | ICD-10-CM | POA: Diagnosis not present

## 2018-09-20 DIAGNOSIS — Z09 Encounter for follow-up examination after completed treatment for conditions other than malignant neoplasm: Secondary | ICD-10-CM | POA: Diagnosis not present

## 2018-09-20 DIAGNOSIS — Z1211 Encounter for screening for malignant neoplasm of colon: Secondary | ICD-10-CM | POA: Insufficient documentation

## 2018-09-20 DIAGNOSIS — D126 Benign neoplasm of colon, unspecified: Secondary | ICD-10-CM

## 2018-09-20 DIAGNOSIS — K644 Residual hemorrhoidal skin tags: Secondary | ICD-10-CM | POA: Insufficient documentation

## 2018-09-20 DIAGNOSIS — Z9841 Cataract extraction status, right eye: Secondary | ICD-10-CM | POA: Insufficient documentation

## 2018-09-20 DIAGNOSIS — K635 Polyp of colon: Secondary | ICD-10-CM | POA: Diagnosis not present

## 2018-09-20 DIAGNOSIS — Z8546 Personal history of malignant neoplasm of prostate: Secondary | ICD-10-CM | POA: Insufficient documentation

## 2018-09-20 DIAGNOSIS — M1990 Unspecified osteoarthritis, unspecified site: Secondary | ICD-10-CM | POA: Diagnosis not present

## 2018-09-20 DIAGNOSIS — G8929 Other chronic pain: Secondary | ICD-10-CM | POA: Diagnosis not present

## 2018-09-20 DIAGNOSIS — Z86010 Personal history of colonic polyps: Secondary | ICD-10-CM | POA: Diagnosis not present

## 2018-09-20 HISTORY — PX: POLYPECTOMY: SHX5525

## 2018-09-20 HISTORY — PX: COLONOSCOPY: SHX5424

## 2018-09-20 SURGERY — COLONOSCOPY
Anesthesia: Moderate Sedation

## 2018-09-20 MED ORDER — MEPERIDINE HCL 50 MG/ML IJ SOLN
INTRAMUSCULAR | Status: AC
  Filled 2018-09-20: qty 1

## 2018-09-20 MED ORDER — STERILE WATER FOR IRRIGATION IR SOLN
Status: DC | PRN
  Administered 2018-09-20: 11:00:00 4 mL

## 2018-09-20 MED ORDER — MEPERIDINE HCL 50 MG/ML IJ SOLN
INTRAMUSCULAR | Status: DC | PRN
  Administered 2018-09-20 (×2): 25 mg via INTRAVENOUS

## 2018-09-20 MED ORDER — MIDAZOLAM HCL 5 MG/5ML IJ SOLN
INTRAMUSCULAR | Status: DC | PRN
  Administered 2018-09-20 (×2): 1 mg via INTRAVENOUS
  Administered 2018-09-20: 2 mg via INTRAVENOUS

## 2018-09-20 MED ORDER — MIDAZOLAM HCL 5 MG/5ML IJ SOLN
INTRAMUSCULAR | Status: AC
  Filled 2018-09-20: qty 10

## 2018-09-20 MED ORDER — LINACLOTIDE 72 MCG PO CAPS: 72.0000 ug | ORAL_CAPSULE | Freq: Every day | ORAL | 3 refills | Status: DC

## 2018-09-20 MED ORDER — SODIUM CHLORIDE 0.9 % IV SOLN
INTRAVENOUS | Status: DC
  Administered 2018-09-20: 10:00:00 via INTRAVENOUS

## 2018-09-20 NOTE — Discharge Instructions (Signed)
No aspirin or NSAIDs for 24 hours. Resume usual medications as before. Linzess 72 mcg by mouth every morning. High-fiber diet. No driving for 24 hours. Physician will call with biopsy results.       Colonoscopy, Adult, Care After This sheet gives you information about how to care for yourself after your procedure. Your doctor may also give you more specific instructions. If you have problems or questions, call your doctor. What can I expect after the procedure? After the procedure, it is common to have:  A small amount of blood in your poop for 24 hours.  Some gas.  Mild cramping or bloating in your belly. Follow these instructions at home: General instructions  For the first 24 hours after the procedure: ? Do not drive or use machinery. ? Do not sign important documents. ? Do not drink alcohol. ? Do your daily activities more slowly than normal. ? Eat foods that are soft and easy to digest.  Take over-the-counter or prescription medicines only as told by your doctor. To help cramping and bloating:   Try walking around.  Put heat on your belly (abdomen) as told by your doctor. Use a heat source that your doctor recommends, such as a moist heat pack or a heating pad. ? Put a towel between your skin and the heat source. ? Leave the heat on for 20-30 minutes. ? Remove the heat if your skin turns bright red. This is especially important if you cannot feel pain, heat, or cold. You can get burned. Eating and drinking   Drink enough fluid to keep your pee (urine) clear or pale yellow.  Return to your normal diet as told by your doctor. Avoid heavy or fried foods that are hard to digest.  Avoid drinking alcohol for as long as told by your doctor. Contact a doctor if:  You have blood in your poop (stool) 2-3 days after the procedure. Get help right away if:  You have more than a small amount of blood in your poop.  You see large clumps of tissue (blood clots) in your  poop.  Your belly is swollen.  You feel sick to your stomach (nauseous).  You throw up (vomit).  You have a fever.  You have belly pain that gets worse, and medicine does not help your pain. Summary  After the procedure, it is common to have a small amount of blood in your poop. You may also have mild cramping and bloating in your belly.  For the first 24 hours after the procedure, do not drive or use machinery, do not sign important documents, and do not drink alcohol.  Get help right away if you have a lot of blood in your poop, feel sick to your stomach, have a fever, or have more belly pain. This information is not intended to replace advice given to you by your health care provider. Make sure you discuss any questions you have with your health care provider. Document Released: 03/12/2010 Document Revised: 12/08/2016 Document Reviewed: 11/02/2015 Elsevier Patient Education  [employer].     High-Fiber Diet Fiber, also called dietary fiber, is a type of carbohydrate that is found in fruits, vegetables, whole grains, and beans. A high-fiber diet can have many health benefits. Your health care provider may recommend a high-fiber diet to help:  Prevent constipation. Fiber can make your bowel movements more regular.  Lower your cholesterol.  Relieve the following conditions: ? Swelling of veins in the anus (hemorrhoids). ? Swelling  and irritation (inflammation) of specific areas of the digestive tract (uncomplicated diverticulosis). ? A problem of the large intestine (colon) that sometimes causes pain and diarrhea (irritable bowel syndrome, IBS).  Prevent overeating as part of a weight-loss plan.  Prevent heart disease, type 2 diabetes, and certain cancers. What is my plan? The recommended daily fiber intake in grams (g) includes:  38 g for men age 69 or younger.  30 g for men over age 68.  88 g for women age 85 or younger.  21 g for women over age 18. You can  get the recommended daily intake of dietary fiber by:  Eating a variety of fruits, vegetables, grains, and beans.  Taking a fiber supplement, if it is not possible to get enough fiber through your diet. What do I need to know about a high-fiber diet?  It is better to get fiber through food sources rather than from fiber supplements. There is not a lot of research about how effective supplements are.  Always check the fiber content on the nutrition facts label of any prepackaged food. Look for foods that contain 5 g of fiber or more per serving.  Talk with a diet and nutrition specialist (dietitian) if you have questions about specific foods that are recommended or not recommended for your medical condition, especially if those foods are not listed below.  Gradually increase how much fiber you consume. If you increase your intake of dietary fiber too quickly, you may have bloating, cramping, or gas.  Drink plenty of water. Water helps you to digest fiber. What are tips for following this plan?  Eat a wide variety of high-fiber foods.  Make sure that half of the grains that you eat each day are whole grains.  Eat breads and cereals that are made with whole-grain flour instead of refined flour or white flour.  Eat brown rice, bulgur wheat, or millet instead of white rice.  Start the day with a breakfast that is high in fiber, such as a cereal that contains 5 g of fiber or more per serving.  Use beans in place of meat in soups, salads, and pasta dishes.  Eat high-fiber snacks, such as berries, raw vegetables, nuts, and popcorn.  Choose whole fruits and vegetables instead of processed forms like juice or sauce. What foods can I eat?  Fruits Berries. Pears. Apples. Oranges. Avocado. Prunes and raisins. Dried figs. Vegetables Sweet potatoes. Spinach. Kale. Artichokes. Cabbage. Broccoli. Cauliflower. Green peas. Carrots. Squash. Grains Whole-grain breads. Multigrain cereal. Oats and  oatmeal. Brown rice. Barley. Bulgur wheat. [place]. Quinoa. Bran muffins. Popcorn. Rye wafer crackers. Meats and other proteins Navy, kidney, and pinto beans. Soybeans. Split peas. Lentils. Nuts and seeds. Dairy Fiber-fortified yogurt. Beverages Fiber-fortified soy milk. Fiber-fortified orange juice. Other foods Fiber bars. The items listed above may not be a complete list of recommended foods and beverages. Contact a dietitian for more options. What foods are not recommended? Fruits Fruit juice. Cooked, strained fruit. Vegetables Fried potatoes. Canned vegetables. Well-cooked vegetables. Grains White bread. Pasta made with refined flour. White rice. Meats and other proteins Fatty cuts of meat. Fried chicken or fried fish. Dairy Milk. Yogurt. Cream cheese. Sour cream. Fats and oils Butters. Beverages Soft drinks. Other foods Cakes and pastries. The items listed above may not be a complete list of foods and beverages to avoid. Contact a dietitian for more information. Summary  Fiber is a type of carbohydrate. It is found in fruits, vegetables, whole grains, and beans.  There are many health benefits of eating a high-fiber diet, such as preventing constipation, lowering blood cholesterol, helping with weight loss, and reducing your risk of heart disease, diabetes, and certain cancers.  Gradually increase your intake of fiber. Increasing too fast can result in cramping, bloating, and gas. Drink plenty of water while you increase your fiber.  The best sources of fiber include whole fruits and vegetables, whole grains, nuts, seeds, and beans. This information is not intended to replace advice given to you by your health care provider. Make sure you discuss any questions you have with your health care provider. Document Released: 02/07/2005 Document Revised: 12/12/2016 Document Reviewed: 12/12/2016 Elsevier Patient Education  2020 [employer].     Diverticulosis  Diverticulosis is a condition that develops when small pouches (diverticula) form in the wall of the large intestine (colon). The colon is where water is absorbed and stool is formed. The pouches form when the inside layer of the colon pushes through weak spots in the outer layers of the colon. You may have a few pouches or many of them. What are the causes? The cause of this condition is not known. What increases the risk? The following factors may make you more likely to develop this condition:  Being older than age 6. Your risk for this condition increases with age. Diverticulosis is rare among people younger than age 48. By age 36, many people have it.  Eating a low-fiber diet.  Having frequent constipation.  Being overweight.  Not getting enough exercise.  Smoking.  Taking over-the-counter pain medicines, like aspirin and ibuprofen.  Having a family history of diverticulosis. What are the signs or symptoms? In most people, there are no symptoms of this condition. If you do have symptoms, they may include:  Bloating.  Cramps in the abdomen.  Constipation or diarrhea.  Pain in the lower left side of the abdomen. How is this diagnosed? This condition is most often diagnosed during an exam for other colon problems. Because diverticulosis usually has no symptoms, it often cannot be diagnosed independently. This condition may be diagnosed by:  Using a flexible scope to examine the colon (colonoscopy).  Taking an X-ray of the colon after dye has been put into the colon (barium enema).  Doing a CT scan. How is this treated? You may not need treatment for this condition if you have never developed an infection related to diverticulosis. If you have had an infection before, treatment may include:  Eating a high-fiber diet. This may include eating more fruits, vegetables, and grains.  Taking a fiber supplement.  Taking a live bacteria  supplement (probiotic).  Taking medicine to relax your colon.  Taking antibiotic medicines. Follow these instructions at home:  Drink 6-8 glasses of water or more each day to prevent constipation.  Try not to strain when you have a bowel movement.  If you have had an infection before: ? Eat more fiber as directed by your health care provider or your diet and nutrition specialist (dietitian). ? Take a fiber supplement or probiotic, if your health care provider approves.  Take over-the-counter and prescription medicines only as told by your health care provider.  If you were prescribed an antibiotic, take it as told by your health care provider. Do not stop taking the antibiotic even if you start to feel better.  Keep all follow-up visits as told by your health care provider. This is important. Contact a health care provider if:  You have pain in  your abdomen.  You have bloating.  You have cramps.  You have not had a bowel movement in 3 days. Get help right away if:  Your pain gets worse.  Your bloating becomes very bad.  You have a fever or chills, and your symptoms suddenly get worse.  You vomit.  You have bowel movements that are bloody or black.  You have bleeding from your rectum. Summary  Diverticulosis is a condition that develops when small pouches (diverticula) form in the wall of the large intestine (colon).  You may have a few pouches or many of them.  This condition is most often diagnosed during an exam for other colon problems.  If you have had an infection related to diverticulosis, treatment may include increasing the fiber in your diet, taking supplements, or taking medicines. This information is not intended to replace advice given to you by your health care provider. Make sure you discuss any questions you have with your health care provider. Document Released: 11/05/2003 Document Revised: 01/20/2017 Document Reviewed: 12/28/2015 Elsevier Patient  Education  [employer].     Colon Polyps  Polyps are tissue growths inside the body. Polyps can grow in many places, including the large intestine (colon). A polyp may be a round bump or a mushroom-shaped growth. You could have one polyp or several. Most colon polyps are noncancerous (benign). However, some colon polyps can become cancerous over time. Finding and removing the polyps early can help prevent this. What are the causes? The exact cause of colon polyps is not known. What increases the risk? You are more likely to develop this condition if you:  Have a family history of colon cancer or colon polyps.  Are older than 67 or older than 45 if you are African American.  Have inflammatory bowel disease, such as ulcerative colitis or Crohn's disease.  Have certain hereditary conditions, such as: ? Familial adenomatous polyposis. ? Lynch syndrome. ? Turcot syndrome. ? Peutz-Jeghers syndrome.  Are overweight.  Smoke cigarettes.  Do not get enough exercise.  Drink too much alcohol.  Eat a diet that is high in fat and red meat and low in fiber.  Had childhood cancer that was treated with abdominal radiation. What are the signs or symptoms? Most polyps do not cause symptoms. If you have symptoms, they may include:  Blood coming from your rectum when having a bowel movement.  Blood in your stool. The stool may look dark red or black.  Abdominal pain.  A change in bowel habits, such as constipation or diarrhea. How is this diagnosed? This condition is diagnosed with a colonoscopy. This is a procedure in which a lighted, flexible scope is inserted into the anus and then passed into the colon to examine the area. Polyps are sometimes found when a colonoscopy is done as part of routine cancer screening tests. How is this treated? Treatment for this condition involves removing any polyps that are found. Most polyps can be removed during a colonoscopy. Those polyps  will then be tested for cancer. Additional treatment may be needed depending on the results of testing. Follow these instructions at home: Lifestyle  Maintain a healthy weight, or lose weight if recommended by your health care provider.  Exercise every day or as told by your health care provider.  Do not use any products that contain nicotine or tobacco, such as cigarettes and e-cigarettes. If you need help quitting, ask your health care provider.  If you drink alcohol, limit how much  you have: ? 0-1 drink a day for women. ? 0-2 drinks a day for men.  Be aware of how much alcohol is in your drink. In the [place] one drink equals one 12 oz bottle of beer (355 mL), one 5 oz glass of wine (148 mL), or one 1 oz shot of hard liquor (44 mL). Eating and drinking   Eat foods that are high in fiber, such as fruits, vegetables, and whole grains.  Eat foods that are high in calcium and vitamin D, such as milk, cheese, yogurt, eggs, liver, fish, and broccoli.  Limit foods that are high in fat, such as fried foods and desserts.  Limit the amount of red meat and processed meat you eat, such as hot dogs, sausage, bacon, and lunch meats. General instructions  Keep all follow-up visits as told by your health care provider. This is important. ? This includes having regularly scheduled colonoscopies. ? Talk to your health care provider about when you need a colonoscopy. Contact a health care provider if:  You have new or worsening bleeding during a bowel movement.  You have new or increased blood in your stool.  You have a change in bowel habits.  You lose weight for no known reason. Summary  Polyps are tissue growths inside the body. Polyps can grow in many places, including the colon.  Most colon polyps are noncancerous (benign), but some can become cancerous over time.  This condition is diagnosed with a colonoscopy.  Treatment for this condition involves removing any polyps that are  found. Most polyps can be removed during a colonoscopy. This information is not intended to replace advice given to you by your health care provider. Make sure you discuss any questions you have with your health care provider. Document Released: 11/04/2003 Document Revised: 05/25/2017 Document Reviewed: 05/25/2017 Elsevier Patient Education  2020 [employer].

## 2018-09-20 NOTE — H&P (Signed)
[name] is an 78 y.o. male.   Chief Complaint: Patient here for colonoscopy. HPI:-Patient is 78 year-old Caucasian male who has history of colonic adenomas and is here for surveillance colonoscopy.  He denies rectal bleeding.  He has chronic constipation.  He has tried polyethylene glycol in the past and it has helped.  Presently he is taking Colace 400 mg daily.  He also complains of lower abdominal pain which describes a soreness.  He gets some relief and his bowels move.  He also has difficulty voiding.  He has a history of prostate CA treated with radiation implants. Family history is negative for CRC.  Past Medical History:  Diagnosis Date  . Arthritis   . Chronic back pain   . Complication of anesthesia    "hard to wake up after back surgery"  . GERD (gastroesophageal reflux disease)   . Helicobacter pylori gastritis    Treated  . Hx of adenomatous colonic polyps   . IBS (irritable bowel syndrome)   . Numbness    left leg after back surgery  . Prostate CA ([place])    seed implants  . Renal insufficiency   . Schatzki's ring    History of    Past Surgical History:  Procedure Laterality Date  . APPENDECTOMY  age 29  . BACK SURGERY  02/08   [place]  . BACK SURGERY  09/24/04   [facility], [facility]  . BACK SURGERY  09/18/02   [place]  . BILIARY STENT PLACEMENT  01/07/2012   Procedure: BILIARY STENT PLACEMENT;  Surgeon: Rogene Houston, MD;  Location: [facility];  Service: Endoscopy;  Laterality: N/A;  . BILIARY STENT PLACEMENT  02/21/2012   Procedure: BILIARY STENT PLACEMENT;  Surgeon: Rogene Houston, MD;  Location: [facility];  Service: Endoscopy;  Laterality: N/A;  Biliary Stent Replacement  . CATARACT EXTRACTION W/PHACO  11/22/2010   Procedure: CATARACT EXTRACTION PHACO AND INTRAOCULAR LENS PLACEMENT (IOC);  Surgeon: Williams Che;  Location: [facility];  Service: Ophthalmology;  Laterality: Right;  CDE: 6.81  . CHOLECYSTECTOMY  03/04/05   [facility], [facility]. Gangrenous cholecystitis complicated by  abscess requiring percutaneous drainage. He also had common duct stones requiring ERCP and sphincterotomy.  . COLONOSCOPY  August 2005   Scattered sigmoid diverticulosis, splenic flexure polyp. Hyperplastic  . COLONOSCOPY  1997   3 cm tubular adenoma and a sending colon  . COLONOSCOPY  02/01/2011   Rourk-friable anal canal, hyperplastic rectal polyp  . COLONOSCOPY N/A 08/15/2013   Procedure: COLONOSCOPY;  Surgeon: Rogene Houston, MD;  [facility];  Service: Endoscopy;  Laterality: N/A;  100  . ERCP  03/02/05   [facility]. Normal-appearing biliary tree (gallbladder not image), status post sphincterotomy with recovery of small pieces of stone material, status post balloon occlusion cholangiogram.  . ERCP  01/07/2012   Procedure: ENDOSCOPIC RETROGRADE CHOLANGIOPANCREATOGRAPHY (ERCP);  Surgeon: Rogene Houston, MD;  Location: [facility];  Service: Endoscopy;  Laterality: N/A;  possible biliary stenting  . ERCP  02/21/2012   Procedure: ENDOSCOPIC RETROGRADE CHOLANGIOPANCREATOGRAPHY (ERCP);  Surgeon: Rogene Houston, MD;  Location: [facility];  Service: Endoscopy;  Laterality: N/A;  common bile duct stone and clip removed  . ERCP N/A 05/18/2012   Procedure: ENDOSCOPIC RETROGRADE CHOLANGIOPANCREATOGRAPHY (ERCP);  Surgeon: Rogene Houston, MD;  Location: [facility];  Service: Endoscopy;  Laterality: N/A;  stone and debri removal  . ESOPHAGOGASTRODUODENOSCOPY  August 2005   Erosive esophagitis and Schatzki ring, small hiatal hernia  .  ESOPHAGOGASTRODUODENOSCOPY  01/2008   Mild reflux esophagitis, small hiatal hernia  . ESOPHAGOGASTRODUODENOSCOPY  02/01/2011   Rourk-erosive reflux esophagitis, small hiatal hernia  . ESOPHAGOGASTRODUODENOSCOPY  10/26/2011   Dr. Eli Phillips gastric submucosal petechia (bx-benign ulceration), juxta ampullary duodenal diverticulum and some mucosal edema involving 1st/2nd portion of duodenum with superficial erosions (bx-superficial ulceration/benign)  .  ESOPHAGOGASTRODUODENOSCOPY  02/21/2012   Procedure: ESOPHAGOGASTRODUODENOSCOPY (EGD);  Surgeon: Rogene Houston, MD;  Location: [facility];  Service: Endoscopy;  Laterality: N/A;  . EYE SURGERY    . Incomplete colonoscopy  December 2009   Left-sided diverticula, mid descending colon polyp, due to recurrent looping and redundancy exam was incomplete. It was felt that the mid colon was reached. Followup barium enema showed colon interposition between the liver and the diaphragm, redundant sigmoid colon but no colon mass or polyp identified.. Pathology revealed tubular adenoma.  . SPHINCTEROTOMY  01/07/2012   Procedure: SPHINCTEROTOMY;  Surgeon: Rogene Houston, MD;  Location[facility];  Service: Endoscopy;  Laterality: N/A;  Extended  . SPYGLASS CHOLANGIOSCOPY  02/21/2012   Procedure: SPYGLASS CHOLANGIOSCOPY;  Surgeon: Rogene Houston, MD;  Location: [facility];  Service: Endoscopy;  Laterality: N/A;  . SPYGLASS CHOLANGIOSCOPY N/A 05/18/2012   Procedure: SPYGLASS CHOLANGIOSCOPY;  Surgeon: Rogene Houston, MD;  Location: [facility];  Service: Endoscopy;  Laterality: N/A;  . TOTAL KNEE ARTHROPLASTY  03/21/2012   Procedure: TOTAL KNEE ARTHROPLASTY;  Surgeon: [name], MD;  Location: [facility];  Service: Orthopedics;  Laterality: Right;  right knee arthroplasty    Family History  Problem Relation Age of Onset  . Diabetes Mother   . Coronary artery disease Mother   . Coronary artery disease Father   . Diabetes Father   . Stomach cancer Father   . Deep vein thrombosis Daughter   . Hypotension Neg Hx   . Anesthesia problems Neg Hx   . Malignant hyperthermia Neg Hx   . Pseudochol deficiency Neg Hx   . Colon cancer Neg Hx    Social History:  reports that he has never smoked. He has never used smokeless tobacco. He reports that he does not drink alcohol or use drugs.  Allergies: No Known Allergies  Medications Prior to Admission  Medication Sig Dispense Refill  . amLODipine (NORVASC) 10 MG tablet Take 1  tablet (10 mg total) by mouth daily. 90 tablet 3  . aspirin EC 81 MG tablet Take 1 tablet (81 mg total) by mouth daily. (Patient taking differently: Take 81 mg by mouth 3 (three) times a week. ) 90 tablet 3  . atorvastatin (LIPITOR) 10 MG tablet Take 10 mg by mouth daily.    Marland Kitchen lubiprostone (AMITIZA) 24 MCG capsule Take 24 mcg by mouth daily as needed for constipation.      No results found for this or any previous visit (from the past 48 hour(s)). No results found.  ROS  Blood pressure 134/80, pulse 81, temperature 98 F (36.7 C), temperature source Oral, resp. rate (!) 21, height 6\' 2"  (1.88 m), weight 111.1 kg, SpO2 98 %. Physical Exam  Constitutional: He appears well-developed and well-nourished.  HENT:  Mouth/Throat: Oropharynx is clear and moist.  Eyes: Conjunctivae are normal. No scleral icterus.  Neck: No thyromegaly present.  Cardiovascular: Normal rate, regular rhythm and normal heart sounds.  No murmur heard. Respiratory: Effort normal and breath sounds normal.  GI:  Abdomen is full.  It is soft with mild tenderness in hypogastric region on deep palpation.  No organomegaly or  masses.  Musculoskeletal:        General: No edema.  Lymphadenopathy:    He has no cervical adenopathy.  Neurological: He is alert.  Skin: Skin is warm and dry.     Assessment/Plan History of colonic polyps. Surveillance colonoscopy.  Hildred Laser, MD 09/20/2018, 10:25 AM

## 2018-09-20 NOTE — OR Nursing (Signed)
Unable to reach cecum advanced to hepatic flexure 1120

## 2018-09-20 NOTE — Op Note (Signed)
[facility] Patient Name: [name] Procedure Date: 09/20/2018 10:13 AM MRN: [id number] Date of Birth: 03/04/1940 Attending MD: Hildred Laser , MD CSN: [id number] Age: 78 Admit Type: Outpatient Procedure:                Colonoscopy Indications:              High risk colon cancer surveillance: Personal                            history of colonic polyps Providers:                Hildred Laser, MD, Hinton Rao, RN, Nelma Rothman,                            Technician Referring MD:             Delphina Cahill, MD Medicines:                Meperidine 50 mg IV, Midazolam 4 mg IV Complications:            No immediate complications. Estimated Blood Loss:     Estimated blood loss was minimal. Procedure:                Pre-Anesthesia Assessment:                           - Prior to the procedure, a History and Physical                            was performed, and patient medications and                            allergies were reviewed. The patient's tolerance of                            previous anesthesia was also reviewed. The risks                            and benefits of the procedure and the sedation                            options and risks were discussed with the patient.                            All questions were answered, and informed consent                            was obtained. Prior Anticoagulants: The patient has                            taken no previous anticoagulant or antiplatelet                            agents except for aspirin. ASA Grade Assessment: II                            -  A patient with mild systemic disease. After                            reviewing the risks and benefits, the patient was                            deemed in satisfactory condition to undergo the                            procedure.                           After obtaining informed consent, the colonoscope                            was passed under direct vision.  Throughout the                            procedure, the patient's blood pressure, pulse, and                            oxygen saturations were monitored continuously. The                            PCF-[id number] ([id number]) was introduced through the                            anus and advanced to the the hepatic flexure. The                            colonoscopy was technically difficult and complex                            due to a redundant colon and a tortuous colon. The                            patient tolerated the procedure well. The quality                            of the bowel preparation was excellent. The rectum                            was photographed. Scope In: 10:36:07 AM Scope Out: 11:26:38 AM Total Procedure Duration: 0 hours 50 minutes 31 seconds  Findings:      Skin tags were found on perianal exam.      A 7 mm polyp was found in the hepatic flexure. The polyp was sessile.       The polyp was removed with a cold snare. Resection and retrieval were       complete. The pathology specimen was placed into Bottle Number 1.      A 5 mm polyp was found in the splenic flexure. The polyp was sessile.       The polyp was removed with a cold snare. Resection and retrieval were  complete. The pathology specimen was placed into Bottle Number 2.      Scattered medium-mouthed diverticula were found in the sigmoid colon.      External hemorrhoids were found during retroflexion. Impression:               - Perianal skin tags found on perianal exam.                           - Incomplete exam to hepatic flexure.                           - One 7 mm polyp at the hepatic flexure, removed                            with a cold snare. Resected and retrieved.                           - One 5 mm polyp at the splenic flexure, removed                            with a cold snare. Resected and retrieved.                           - Diverticulosis in the sigmoid colon.                            - External hemorrhoids. Moderate Sedation:      Moderate (conscious) sedation was administered by the endoscopy nurse       and supervised by the endoscopist. The following parameters were       monitored: oxygen saturation, heart rate, blood pressure, CO2       capnography and response to care. Total physician intraservice time was       60 minutes. Recommendation:           - Patient has a contact number available for                            emergencies. The signs and symptoms of potential                            delayed complications were discussed with the                            patient. Return to normal activities tomorrow.                            Written discharge instructions were provided to the                            patient.                           - High fiber diet today.                           -  Continue present medications.                           - No aspirin, ibuprofen, naproxen, or other                            non-steroidal anti-inflammatory drugs for 1 day.                           - Await pathology results.                           - Repeat colonoscopy is recommended. The                            colonoscopy date will be determined after pathology                            results from today's exam become available for                            review. Procedure Code(s):        --- Professional ---                           [phone], 52, Colonoscopy, flexible; with removal of                            tumor(s), polyp(s), or other lesion(s) by snare                            technique                           99153, Moderate sedation; each additional 15                            minutes intraservice time                           99153, Moderate sedation; each additional 15                            minutes intraservice time                           99153, Moderate sedation; each additional 15                             minutes intraservice time                           G0500, Moderate sedation services provided by the                            same physician or other qualified health care  professional performing a gastrointestinal                            endoscopic service that sedation supports,                            requiring the presence of an independent trained                            observer to assist in the monitoring of the                            patient's level of consciousness and physiological                            status; initial 15 minutes of intra-service time;                            patient age 50 years or older (additional time may                            be reported with [phone], as appropriate) Diagnosis Code(s):        --- Professional ---                           K64.4, Residual hemorrhoidal skin tags                           Z86.010, Personal history of colonic polyps                           K63.5, Polyp of colon                           K57.30, Diverticulosis of large intestine without                            perforation or abscess without bleeding CPT copyright 2019 [employer]. All rights reserved. The codes documented in this report are preliminary and upon coder review may  be revised to meet current compliance requirements. Hildred Laser, MD Hildred Laser, MD 09/20/2018 11:38:53 AM This report has been signed electronically. Number of Addenda: 0

## 2018-09-26 ENCOUNTER — Other Ambulatory Visit (INDEPENDENT_AMBULATORY_CARE_PROVIDER_SITE_OTHER): Payer: Self-pay | Admitting: Internal Medicine

## 2018-10-01 ENCOUNTER — Encounter (HOSPITAL_COMMUNITY): Payer: Self-pay | Admitting: Internal Medicine

## 2018-10-10 DIAGNOSIS — L570 Actinic keratosis: Secondary | ICD-10-CM | POA: Diagnosis not present

## 2018-10-10 DIAGNOSIS — B079 Viral wart, unspecified: Secondary | ICD-10-CM | POA: Diagnosis not present

## 2018-10-10 DIAGNOSIS — L820 Inflamed seborrheic keratosis: Secondary | ICD-10-CM | POA: Diagnosis not present

## 2018-10-10 DIAGNOSIS — X32XXXA Exposure to sunlight, initial encounter: Secondary | ICD-10-CM | POA: Diagnosis not present

## 2018-10-10 DIAGNOSIS — B078 Other viral warts: Secondary | ICD-10-CM | POA: Diagnosis not present

## 2018-10-16 ENCOUNTER — Encounter (INDEPENDENT_AMBULATORY_CARE_PROVIDER_SITE_OTHER): Payer: Self-pay | Admitting: *Deleted

## 2018-10-22 ENCOUNTER — Other Ambulatory Visit (INDEPENDENT_AMBULATORY_CARE_PROVIDER_SITE_OTHER): Payer: Self-pay | Admitting: Internal Medicine

## 2018-10-22 DIAGNOSIS — Z5309 Procedure and treatment not carried out because of other contraindication: Secondary | ICD-10-CM

## 2018-10-22 DIAGNOSIS — Q438 Other specified congenital malformations of intestine: Secondary | ICD-10-CM

## 2018-10-22 DIAGNOSIS — K5909 Other constipation: Secondary | ICD-10-CM

## 2018-10-26 ENCOUNTER — Other Ambulatory Visit: Payer: Self-pay

## 2018-10-26 ENCOUNTER — Ambulatory Visit: Payer: PPO | Admitting: Urology

## 2018-10-26 DIAGNOSIS — N401 Enlarged prostate with lower urinary tract symptoms: Secondary | ICD-10-CM

## 2018-10-26 DIAGNOSIS — Z8546 Personal history of malignant neoplasm of prostate: Secondary | ICD-10-CM | POA: Diagnosis not present

## 2018-11-06 ENCOUNTER — Ambulatory Visit
Admission: RE | Admit: 2018-11-06 | Discharge: 2018-11-06 | Disposition: A | Discharge status: Home / Self Care | Payer: PPO | Source: Ambulatory Visit | Attending: Internal Medicine | Admitting: Internal Medicine

## 2018-11-06 DIAGNOSIS — K5909 Other constipation: Secondary | ICD-10-CM

## 2018-11-06 DIAGNOSIS — K5900 Constipation, unspecified: Secondary | ICD-10-CM | POA: Diagnosis not present

## 2018-11-06 DIAGNOSIS — Z5309 Procedure and treatment not carried out because of other contraindication: Secondary | ICD-10-CM

## 2018-11-06 DIAGNOSIS — Q438 Other specified congenital malformations of intestine: Secondary | ICD-10-CM

## 2018-11-09 DIAGNOSIS — N183 Chronic kidney disease, stage 3 (moderate): Secondary | ICD-10-CM | POA: Diagnosis not present

## 2018-11-09 DIAGNOSIS — I129 Hypertensive chronic kidney disease with stage 1 through stage 4 chronic kidney disease, or unspecified chronic kidney disease: Secondary | ICD-10-CM | POA: Diagnosis not present

## 2018-11-09 DIAGNOSIS — I2510 Atherosclerotic heart disease of native coronary artery without angina pectoris: Secondary | ICD-10-CM | POA: Diagnosis not present

## 2018-11-09 DIAGNOSIS — R7301 Impaired fasting glucose: Secondary | ICD-10-CM | POA: Diagnosis not present

## 2018-11-09 DIAGNOSIS — Z8546 Personal history of malignant neoplasm of prostate: Secondary | ICD-10-CM | POA: Diagnosis not present

## 2018-11-09 DIAGNOSIS — E782 Mixed hyperlipidemia: Secondary | ICD-10-CM | POA: Diagnosis not present

## 2018-12-15 ENCOUNTER — Other Ambulatory Visit: Payer: Self-pay | Admitting: Cardiovascular Disease

## 2018-12-19 DIAGNOSIS — I129 Hypertensive chronic kidney disease with stage 1 through stage 4 chronic kidney disease, or unspecified chronic kidney disease: Secondary | ICD-10-CM | POA: Diagnosis not present

## 2018-12-19 DIAGNOSIS — Z8546 Personal history of malignant neoplasm of prostate: Secondary | ICD-10-CM | POA: Diagnosis not present

## 2018-12-19 DIAGNOSIS — R7301 Impaired fasting glucose: Secondary | ICD-10-CM | POA: Diagnosis not present

## 2018-12-19 DIAGNOSIS — I2510 Atherosclerotic heart disease of native coronary artery without angina pectoris: Secondary | ICD-10-CM | POA: Diagnosis not present

## 2018-12-19 DIAGNOSIS — E782 Mixed hyperlipidemia: Secondary | ICD-10-CM | POA: Diagnosis not present

## 2018-12-24 DIAGNOSIS — Z8546 Personal history of malignant neoplasm of prostate: Secondary | ICD-10-CM | POA: Diagnosis not present

## 2018-12-24 DIAGNOSIS — E782 Mixed hyperlipidemia: Secondary | ICD-10-CM | POA: Diagnosis not present

## 2018-12-24 DIAGNOSIS — I2510 Atherosclerotic heart disease of native coronary artery without angina pectoris: Secondary | ICD-10-CM | POA: Diagnosis not present

## 2018-12-24 DIAGNOSIS — I129 Hypertensive chronic kidney disease with stage 1 through stage 4 chronic kidney disease, or unspecified chronic kidney disease: Secondary | ICD-10-CM | POA: Diagnosis not present

## 2018-12-24 DIAGNOSIS — R7301 Impaired fasting glucose: Secondary | ICD-10-CM | POA: Diagnosis not present

## 2018-12-25 ENCOUNTER — Other Ambulatory Visit: Payer: Self-pay | Admitting: Cardiovascular Disease

## 2018-12-25 MED ORDER — AMLODIPINE BESYLATE 10 MG PO TABS: ORAL_TABLET | ORAL | 3 refills | Status: DC

## 2018-12-25 NOTE — Telephone Encounter (Signed)
Pt has changed his pharmacy to [place] a new Rx for his amLODipine (NORVASC) 10 MG tablet [id number]  Sent there

## 2018-12-25 NOTE — Telephone Encounter (Signed)
Pharmacy changed, rx e-scribed

## 2019-01-07 DIAGNOSIS — Z23 Encounter for immunization: Secondary | ICD-10-CM | POA: Diagnosis not present

## 2019-01-15 DIAGNOSIS — N183 Chronic kidney disease, stage 3 unspecified: Secondary | ICD-10-CM | POA: Diagnosis not present

## 2019-01-15 DIAGNOSIS — R7301 Impaired fasting glucose: Secondary | ICD-10-CM | POA: Diagnosis not present

## 2019-01-15 DIAGNOSIS — E785 Hyperlipidemia, unspecified: Secondary | ICD-10-CM | POA: Diagnosis not present

## 2019-01-15 DIAGNOSIS — I10 Essential (primary) hypertension: Secondary | ICD-10-CM | POA: Diagnosis not present

## 2019-01-15 DIAGNOSIS — E782 Mixed hyperlipidemia: Secondary | ICD-10-CM | POA: Diagnosis not present

## 2019-01-16 ENCOUNTER — Other Ambulatory Visit: Payer: Self-pay

## 2019-01-21 DIAGNOSIS — M1712 Unilateral primary osteoarthritis, left knee: Secondary | ICD-10-CM | POA: Diagnosis not present

## 2019-01-21 DIAGNOSIS — M25551 Pain in right hip: Secondary | ICD-10-CM | POA: Diagnosis not present

## 2019-01-21 DIAGNOSIS — I129 Hypertensive chronic kidney disease with stage 1 through stage 4 chronic kidney disease, or unspecified chronic kidney disease: Secondary | ICD-10-CM | POA: Diagnosis not present

## 2019-01-21 DIAGNOSIS — M25552 Pain in left hip: Secondary | ICD-10-CM | POA: Diagnosis not present

## 2019-01-21 DIAGNOSIS — R7301 Impaired fasting glucose: Secondary | ICD-10-CM | POA: Diagnosis not present

## 2019-01-21 DIAGNOSIS — R7303 Prediabetes: Secondary | ICD-10-CM | POA: Diagnosis not present

## 2019-01-21 DIAGNOSIS — I10 Essential (primary) hypertension: Secondary | ICD-10-CM | POA: Diagnosis not present

## 2019-01-21 DIAGNOSIS — Z8546 Personal history of malignant neoplasm of prostate: Secondary | ICD-10-CM | POA: Diagnosis not present

## 2019-01-21 DIAGNOSIS — E782 Mixed hyperlipidemia: Secondary | ICD-10-CM | POA: Diagnosis not present

## 2019-01-21 DIAGNOSIS — I2510 Atherosclerotic heart disease of native coronary artery without angina pectoris: Secondary | ICD-10-CM | POA: Diagnosis not present

## 2019-01-22 ENCOUNTER — Ambulatory Visit (INDEPENDENT_AMBULATORY_CARE_PROVIDER_SITE_OTHER): Payer: PPO | Admitting: Cardiovascular Disease

## 2019-01-22 ENCOUNTER — Telehealth: Payer: Self-pay | Admitting: Cardiovascular Disease

## 2019-01-22 ENCOUNTER — Encounter: Payer: Self-pay | Admitting: Cardiovascular Disease

## 2019-01-22 ENCOUNTER — Encounter: Payer: Self-pay | Admitting: *Deleted

## 2019-01-22 ENCOUNTER — Other Ambulatory Visit: Payer: Self-pay

## 2019-01-22 VITALS — BP 140/64 | HR 67 | Ht 74.0 in | Wt 257.0 lb

## 2019-01-22 DIAGNOSIS — R0600 Dyspnea, unspecified: Secondary | ICD-10-CM

## 2019-01-22 DIAGNOSIS — I10 Essential (primary) hypertension: Secondary | ICD-10-CM | POA: Diagnosis not present

## 2019-01-22 DIAGNOSIS — Z01812 Encounter for preprocedural laboratory examination: Secondary | ICD-10-CM | POA: Diagnosis not present

## 2019-01-22 DIAGNOSIS — I208 Other forms of angina pectoris: Secondary | ICD-10-CM

## 2019-01-22 DIAGNOSIS — E785 Hyperlipidemia, unspecified: Secondary | ICD-10-CM | POA: Diagnosis not present

## 2019-01-22 DIAGNOSIS — R0609 Other forms of dyspnea: Secondary | ICD-10-CM

## 2019-01-22 MED ORDER — METOPROLOL TARTRATE 50 MG PO TABS: 50.0000 mg | ORAL_TABLET | Freq: Once | ORAL | 0 refills | Status: DC

## 2019-01-22 NOTE — Telephone Encounter (Signed)
Pre-cert Verification for the following procedure    Echo scheduled for 01-23-2019 at the [facility]

## 2019-01-22 NOTE — Patient Instructions (Signed)
Medication Instructions:  Continue all current medications.  Labwork:  BMET - order given today.   Do just prior to Coronary CT.    Testing/Procedures:  Your physician has requested that you have an echocardiogram. Echocardiography is a painless test that uses sound waves to create images of your heart. It provides your doctor with information about the size and shape of your heart and how well your heart's chambers and valves are working. This procedure takes approximately one hour. There are no restrictions for this procedure.  Coronary CT - done at [facility].   Office will contact with results via phone or letter.    Follow-Up: 2 months   Any Other Special Instructions Will Be Listed Below (If Applicable).  If you need a refill on your cardiac medications before your next appointment, please call your pharmacy.

## 2019-01-22 NOTE — Progress Notes (Signed)
Cardiology Office Note  Date: 01/22/2019   ID: SURYANSH CRAIGEN, DOB [birth year], MRN [id number]  PCP:  Celene Squibb, MD  Cardiologist:  Kate Sable, MD Electrophysiologist:  None   No chief complaint on file.   History of Present Illness: [name] is a 78 y.o. male hypertension, bradycardia, fatigue, dyspnea on exertion.  Patient states over the last year he is experiencing gradual increase in dyspnea on exertion with minimal effort recently.  States he can only walk short distances and has to stop secondary to dyspnea.  He denies any chest pain, pressure, tightness, or radiation to neck, arm, jaw, or back.  Denies any associated nausea, vomiting, or diaphoresis.  No recent acute illnesses, hospitalizations, surgeries, tick bites, or travels.  He wears a mask and uses good hand hygiene.  He denies any exposure to any Covid contacts.  EKG on arrival shows sinus rhythm with first-degree AV block with premature atrial complexes with aberrant conduction, right bundle branch block, left anterior fascicular block, left ventricular hypertrophy with repolarization abnormality.  Cannot rule out septal infarct age undetermined.  Heart rate 71.   At the last visit due to increasing exertional dyspnea a nuclear stress test was ordered.  Report demonstrated there appeared to be possible subtle diaphragmatic attenuation versus prior infarct with very mild peri-infarct ischemia.  Patient did not have a sleep study performed because he believes he cannot tolerate the CPAP due to the fact he has to get up several times a night to urinate.  History of prostate CA status post radiation therapy.  Past Medical History:  Diagnosis Date  . Arthritis   . Chronic back pain   . Complication of anesthesia    "hard to wake up after back surgery"  . GERD (gastroesophageal reflux disease)   . Helicobacter pylori gastritis    Treated  . Hx of adenomatous colonic polyps   . IBS (irritable bowel syndrome)    . Numbness    left leg after back surgery  . Prostate CA ([place])    seed implants  . Renal insufficiency   . Schatzki's ring    History of    Past Surgical History:  Procedure Laterality Date  . APPENDECTOMY  age 32  . BACK SURGERY  02/08   [place]  . BACK SURGERY  09/24/04   lumbar, [facility]  . BACK SURGERY  09/18/02   [place]  . BILIARY STENT PLACEMENT  01/07/2012   Procedure: BILIARY STENT PLACEMENT;  Surgeon: Rogene Houston, MD;  Location: [facility];  Service: Endoscopy;  Laterality: N/A;  . BILIARY STENT PLACEMENT  02/21/2012   Procedure: BILIARY STENT PLACEMENT;  Surgeon: Rogene Houston, MD;  Location: [facility];  Service: Endoscopy;  Laterality: N/A;  Biliary Stent Replacement  . CATARACT EXTRACTION W/PHACO  11/22/2010   Procedure: CATARACT EXTRACTION PHACO AND INTRAOCULAR LENS PLACEMENT (IOC);  Surgeon: Williams Che;  Location: [facility];  Service: Ophthalmology;  Laterality: Right;  CDE: 6.81  . CHOLECYSTECTOMY  03/04/05   [facility], [place]. Gangrenous cholecystitis complicated by abscess requiring percutaneous drainage. He also had common duct stones requiring ERCP and sphincterotomy.  . COLONOSCOPY  August 2005   Scattered sigmoid diverticulosis, splenic flexure polyp. Hyperplastic  . COLONOSCOPY  1997   3 cm tubular adenoma and a sending colon  . COLONOSCOPY  02/01/2011   Rourk-friable anal canal, hyperplastic rectal polyp  . COLONOSCOPY N/A 08/15/2013   Procedure: COLONOSCOPY;  Surgeon: Rogene Houston, MD;  [facility];  Service: Endoscopy;  Laterality: N/A;  100  . COLONOSCOPY N/A 09/20/2018   Procedure: COLONOSCOPY;  Surgeon: Rogene Houston, MD;  [facility];  Service: Endoscopy;  Laterality: N/A;  12:00pm  . ERCP  03/02/05   [facility], [facility]. Normal-appearing biliary tree (gallbladder not image), status post sphincterotomy with recovery of small pieces of stone material, status post balloon occlusion cholangiogram.  . ERCP  01/07/2012   Procedure: ENDOSCOPIC  RETROGRADE CHOLANGIOPANCREATOGRAPHY (ERCP);  Surgeon: Rogene Houston, MD;  Location: [facility];  Service: Endoscopy;  Laterality: N/A;  possible biliary stenting  . ERCP  02/21/2012   Procedure: ENDOSCOPIC RETROGRADE CHOLANGIOPANCREATOGRAPHY (ERCP);  Surgeon: Rogene Houston, MD;  Location: [facility];  Service: Endoscopy;  Laterality: N/A;  common bile duct stone and clip removed  . ERCP N/A 05/18/2012   Procedure: ENDOSCOPIC RETROGRADE CHOLANGIOPANCREATOGRAPHY (ERCP);  Surgeon: Rogene Houston, MD;  Location: [facility];  Service: Endoscopy;  Laterality: N/A;  stone and debri removal  . ESOPHAGOGASTRODUODENOSCOPY  August 2005   Erosive esophagitis and Schatzki ring, small hiatal hernia  . ESOPHAGOGASTRODUODENOSCOPY  01/2008   Mild reflux esophagitis, small hiatal hernia  . ESOPHAGOGASTRODUODENOSCOPY  02/01/2011   Rourk-erosive reflux esophagitis, small hiatal hernia  . ESOPHAGOGASTRODUODENOSCOPY  10/26/2011   Dr. Eli Phillips gastric submucosal petechia (bx-benign ulceration), juxta ampullary duodenal diverticulum and some mucosal edema involving 1st/2nd portion of duodenum with superficial erosions (bx-superficial ulceration/benign)  . ESOPHAGOGASTRODUODENOSCOPY  02/21/2012   Procedure: ESOPHAGOGASTRODUODENOSCOPY (EGD);  Surgeon: Rogene Houston, MD;  Location[facility];  Service: Endoscopy;  Laterality: N/A;  . EYE SURGERY    . Incomplete colonoscopy  December 2009   Left-sided diverticula, mid descending colon polyp, due to recurrent looping and redundancy exam was incomplete. It was felt that the mid colon was reached. Followup barium enema showed colon interposition between the liver and the diaphragm, redundant sigmoid colon but no colon mass or polyp identified.. Pathology revealed tubular adenoma.  Marland Kitchen POLYPECTOMY  09/20/2018   Procedure: POLYPECTOMY;  Surgeon: Rogene Houston, MD;  [facility];  Service: Endoscopy;;  Hepatic flexure polyp cold snare, splenic flexure polyp  .  SPHINCTEROTOMY  01/07/2012   Procedure: SPHINCTEROTOMY;  Surgeon: Rogene Houston, MD;  [facility];  Service: Endoscopy;  Laterality: N/A;  Extended  . SPYGLASS CHOLANGIOSCOPY  02/21/2012   Procedure: SPYGLASS CHOLANGIOSCOPY;  Surgeon: Rogene Houston, MD;  [facility];  Service: Endoscopy;  Laterality: N/A;  . SPYGLASS CHOLANGIOSCOPY N/A 05/18/2012   Procedure: SPYGLASS CHOLANGIOSCOPY;  Surgeon: Rogene Houston, MD;  Location[facility];  Service: Endoscopy;  Laterality: N/A;  . TOTAL KNEE ARTHROPLASTY  03/21/2012   Procedure: TOTAL KNEE ARTHROPLASTY;  Surgeon: [name], MD;  Location: [facility];  Service: Orthopedics;  Laterality: Right;  right knee arthroplasty    Current Outpatient Medications  Medication Sig Dispense Refill  . amLODipine (NORVASC) 10 MG tablet TAKE ONE (1) TABLET BY MOUTH EVERY DAY 90 tablet 3  . aspirin EC 81 MG tablet Take 1 tablet (81 mg total) by mouth daily. 90 tablet 3  . atorvastatin (LIPITOR) 10 MG tablet Take 10 mg by mouth daily.    . metoprolol tartrate (LOPRESSOR) 50 MG tablet Take 1 tablet (50 mg total) by mouth once for 1 dose. 1 tablet 0   No current facility-administered medications for this visit.    Allergies:  Patient has no known allergies.   Social History: The patient  reports that he has never  smoked. He has never used smokeless tobacco. He reports that he does not drink alcohol or use drugs.   Family History: The patient's family history includes Coronary artery disease in his father and mother; Deep vein thrombosis in his daughter; Diabetes in his father and mother; Stomach cancer in his father.   ROS:  Please see the history of present illness. Otherwise, complete review of systems is positive for none.  All other systems are reviewed and negative.   Physical Exam: VS:  BP 140/64   Pulse 67   Ht 6\' 2"  (1.88 m)   Wt 257 lb (116.6 kg)   SpO2 98%   BMI 33.00 kg/m , BMI Body mass index is 33 kg/m.  Wt Readings from Last 3  Encounters:  01/22/19 257 lb (116.6 kg)  09/20/18 245 lb (111.1 kg)  04/30/18 255 lb (115.7 kg)    General: Patient appears comfortable at rest. Neck: Supple, no elevated JVP or carotid bruits, no thyromegaly. Lungs: Clear to auscultation, nonlabored breathing at rest. Cardiac: Regular rate and rhythm, no S3 or significant systolic murmur, no pericardial rub. Abdomen: Soft, nontender, no hepatomegaly, bowel sounds present, no guarding or rebound. Extremities: No pitting edema, distal pulses 2+. Skin: Warm and dry.  Neuropsychiatric: Alert and oriented x3, affect grossly appropriate.  ECG:  An ECG dated January 22, 2019 was personally reviewed today and demonstrated:  Sinus rhythm first-degree AV block with premature atrial complexes with aberrant conduction.  Right bundle branch block.  Left anterior fascicular block.  Left ventricular hypertrophy with repolarization abnormality.  Cannot rule out septal infarct, age undetermined.  Heart rate is 71  Recent Labwork: 04/30/2018: ALT 19; AST 25; Hemoglobin 16.8; Platelets 165     Component Value Date/Time   CHOL 200 11/10/2015 0734   TRIG 208 (H) 11/10/2015 0734   HDL 34 (L) 11/10/2015 0734   CHOLHDL 5.9 (H) 11/10/2015 0734   VLDL 42 (H) 11/10/2015 0734   LDLCALC 124 11/10/2015 0734    Other Studies Reviewed Today:   Echocardiogram 6/ 21/2016  Study Conclusions  - Left ventricle: The cavity size was normal. There was mild   concentric hypertrophy. Systolic function was normal. The   estimated ejection fraction was approximately 55%. Doppler   parameters are consistent with abnormal left ventricular   relaxation (grade 1 diastolic dysfunction). Doppler parameters   are consistent with high ventricular filling pressure. - Regional wall motion abnormality: Hypokinesis of the mid   anteroseptal myocardium. - Ventricular septum: Septal motion showed abnormal function and   dyssynergy. These changes are consistent with  intraventricular   conduction delay. - Aortic valve: Mildly calcified annulus. Trileaflet; mildly   thickened leaflets. Aortic valve sclerosis without stenosis.   There was mild regurgitation. - Mitral valve: Mildly calcified annulus. Mildly thickened leaflets   . There was trivial regurgitation. - Left atrium: The atrium was mildly dilated. Volume/bsa, ES,   (1-plane Simpson&'s, A2C): 30.6 ml/m^2. - Pulmonic valve: There was mild regurgitation.  Transthoracic echocardiography.  M-mode, complete 2D, spectral Doppler, and color Doppler.  Birthdate:  Patient birthdate: 1940/11/19.  Age:  Patient is 78 yr old.  Sex:  Gender: male. BMI: 32.5 kg/m^2.  Blood pressure:     167/72  Patient status: Inpatient.  Study date:  Study date: 08/12/2014. Study time: 09:19 AM.  Location:  Echo laboratory.  -------------------------------------------------------------------  ------------------------------------------------------------------- Left ventricle:  The cavity size was normal. There was mild concentric hypertrophy. Systolic function was normal. The estimated ejection fraction was approximately 55%.  Regional wall motion abnormalities:  Hypokinesis of the mid anteroseptal myocardium. Doppler parameters are consistent with abnormal left ventricular relaxation (grade 1 diastolic dysfunction). Doppler parameters are consistent with high ventricular filling pressure.  ------------------------------------------------------------------- Aortic valve:   Mildly calcified annulus. Trileaflet; mildly thickened leaflets. Aortic valve sclerosis without stenosis. Doppler:  There was mild regurgitation.  NST on 09/06/2017  Blood pressure demonstrated a normal response to exercise.  There was no ST segment deviation noted during stress.  This is a low risk study.  The left ventricular ejection fraction is normal (55-65%).  Moderate size mild intensity inferior defect with very mild  reversibility. The normal wall motion would suggest possible subdiaphragmatic attenuation, however cannot exclude prior infarct with very mild per-infarct ischemia. Either findings would support low risk.    Assessment and Plan:  1. Essential hypertension   2. Hyperlipidemia LDL goal <70   3. Other forms of angina pectoris ([place])   4. DOE (dyspnea on exertion)   5. Pre-procedure lab exam    1. Essential hypertension Blood pressure has been running in the 140s over 60s recently.  Continue amlodipine 10 mg, metoprolol 50 mg daily.  2. Hyperlipidemia LDL goal <70 Patient states he had recent lab work at Dr. Juel Burrow office.  We will obtain lab work to see if lipid panel was ordered.    3. Other forms of angina pectoris [facility]) Patient admits to some chest tightness when performing exertional activity.  He denies any classic anginal symptoms such as chest pain with radiation, nausea, vomiting, diaphoresis.  Nuclear stress test in July of last year showed moderate size mild intensity inferior defect with very mild reversibility. The normal wall motion would suggest possible subdiaphragmatic attenuation, however cannot exclude prior infarct with very mild per-infarct ischemia.  Coronary CT angiogram.  4. DOE (dyspnea on exertion) Complains of increasing dyspnea on exertion gradually worsening over the last couple of months per his statement.  States he can only walk short stenosis but before having to stop and catch his breath.  States this has become more overt over the last month.  Get an echocardiogram  5. Pre-procedure lab exam Get a basic metabolic panel prior to CT angiogram.  Medication Adjustments/Labs and Tests Ordered: Current medicines are reviewed at length with the patient today.  Concerns regarding medicines are outlined above.    Patient Instructions  Medication Instructions:  Continue all current medications.  Labwork:  BMET - order given today.   Do just prior to Coronary  CT.    Testing/Procedures:  Your physician has requested that you have an echocardiogram. Echocardiography is a painless test that uses sound waves to create images of your heart. It provides your doctor with information about the size and shape of your heart and how well your heart's chambers and valves are working. This procedure takes approximately one hour. There are no restrictions for this procedure.  Coronary CT - done at [facility].   Office will contact with results via phone or letter.    Follow-Up: 2 months   Any Other Special Instructions Will Be Listed Below (If Applicable).  If you need a refill on your cardiac medications before your next appointment, please call your pharmacy.       The patient was seen and examined, and I agree with the history, physical exam, assessment and plan as documented above.  He has been experiencing progressive exertional dyspnea when doing yard work and other activities around the house.  I will obtain an echocardiogram  and proceed with coronary CT angiography to evaluate for obstructive coronary artery disease.  Kate Sable, MD, [facility]  01/22/2019 5:06 PM

## 2019-01-23 ENCOUNTER — Ambulatory Visit (INDEPENDENT_AMBULATORY_CARE_PROVIDER_SITE_OTHER): Payer: PPO

## 2019-01-23 ENCOUNTER — Telehealth: Payer: Self-pay | Admitting: Cardiovascular Disease

## 2019-01-23 DIAGNOSIS — I10 Essential (primary) hypertension: Secondary | ICD-10-CM | POA: Diagnosis not present

## 2019-01-23 DIAGNOSIS — Z8546 Personal history of malignant neoplasm of prostate: Secondary | ICD-10-CM | POA: Diagnosis not present

## 2019-01-23 DIAGNOSIS — E669 Obesity, unspecified: Secondary | ICD-10-CM | POA: Diagnosis not present

## 2019-01-23 DIAGNOSIS — N183 Chronic kidney disease, stage 3 unspecified: Secondary | ICD-10-CM | POA: Diagnosis not present

## 2019-01-23 DIAGNOSIS — R0609 Other forms of dyspnea: Secondary | ICD-10-CM

## 2019-01-23 DIAGNOSIS — R0600 Dyspnea, unspecified: Secondary | ICD-10-CM | POA: Diagnosis not present

## 2019-01-23 DIAGNOSIS — I129 Hypertensive chronic kidney disease with stage 1 through stage 4 chronic kidney disease, or unspecified chronic kidney disease: Secondary | ICD-10-CM | POA: Diagnosis not present

## 2019-01-23 NOTE — Telephone Encounter (Signed)
Pt is concerned about having this test done at [facility] or [place] due to rise in [place] cases - pt wants to know if he can hold off on this test until COVID numbers have declined some - pt had echo today

## 2019-01-23 NOTE — Telephone Encounter (Signed)
Pt prefers to wait until COVID cases decrease - will take out request for C CT and forward to schedulers

## 2019-01-23 NOTE — Telephone Encounter (Signed)
Patient came for Echo today and question if [id number] - CT CORONARY MORPH W/CTA COR W/SCORE W/CA W/CM &/OR WO/CM will be necessary and if it is he would like to put off until after all the COVID numbers have decreased

## 2019-01-23 NOTE — Telephone Encounter (Signed)
I would leave that up to him.  If he decides to postpone having the study, please postpone his follow-up appointment accordingly.

## 2019-01-24 ENCOUNTER — Encounter: Payer: Self-pay | Admitting: *Deleted

## 2019-01-25 ENCOUNTER — Telehealth: Payer: Self-pay | Admitting: *Deleted

## 2019-01-25 ENCOUNTER — Other Ambulatory Visit: Payer: Self-pay

## 2019-01-25 ENCOUNTER — Ambulatory Visit: Payer: PPO | Admitting: Urology

## 2019-01-25 NOTE — Telephone Encounter (Signed)
Notes recorded by Laurine Blazer, LPN on [id number] at [id number] PM EST  Patient notified. Copy to pmd. Follow up scheduled for 04/02/2019 with Dr. Bronson Ing in [place] office.    ------   Notes recorded by Herminio Commons, MD on 01/23/2019 at 3:53 PM EST  Normal pumping function.

## 2019-01-29 ENCOUNTER — Telehealth: Payer: Self-pay | Admitting: *Deleted

## 2019-01-29 NOTE — Telephone Encounter (Signed)
-----   Message from Chanda Busing sent at 01/28/2019  8:14 AM EST ----- OK. Took care of the authorization request.  Do we need to put a note in patient's chart. ----- Message ----- From: Massie Maroon, CMA Sent: 01/23/2019   4:59 PM EST To: Chanda Busing  This pt doesn't want to do coronary CT due to COVID cases at [facility] at this time. I cx the request, didn't know if you needed to know  [facility]

## 2019-03-05 ENCOUNTER — Other Ambulatory Visit: Payer: Self-pay

## 2019-03-05 DIAGNOSIS — Z8546 Personal history of malignant neoplasm of prostate: Secondary | ICD-10-CM

## 2019-03-21 DIAGNOSIS — M1712 Unilateral primary osteoarthritis, left knee: Secondary | ICD-10-CM | POA: Diagnosis not present

## 2019-03-27 ENCOUNTER — Other Ambulatory Visit: Payer: Self-pay | Admitting: Cardiovascular Disease

## 2019-03-28 ENCOUNTER — Other Ambulatory Visit: Payer: Self-pay | Admitting: Orthopedic Surgery

## 2019-04-01 DIAGNOSIS — R7303 Prediabetes: Secondary | ICD-10-CM | POA: Diagnosis not present

## 2019-04-01 DIAGNOSIS — Z0001 Encounter for general adult medical examination with abnormal findings: Secondary | ICD-10-CM | POA: Diagnosis not present

## 2019-04-01 DIAGNOSIS — R7301 Impaired fasting glucose: Secondary | ICD-10-CM | POA: Diagnosis not present

## 2019-04-01 DIAGNOSIS — M1712 Unilateral primary osteoarthritis, left knee: Secondary | ICD-10-CM | POA: Diagnosis not present

## 2019-04-01 DIAGNOSIS — E782 Mixed hyperlipidemia: Secondary | ICD-10-CM | POA: Diagnosis not present

## 2019-04-01 DIAGNOSIS — Z8546 Personal history of malignant neoplasm of prostate: Secondary | ICD-10-CM | POA: Diagnosis not present

## 2019-04-01 DIAGNOSIS — I129 Hypertensive chronic kidney disease with stage 1 through stage 4 chronic kidney disease, or unspecified chronic kidney disease: Secondary | ICD-10-CM | POA: Diagnosis not present

## 2019-04-01 DIAGNOSIS — M25552 Pain in left hip: Secondary | ICD-10-CM | POA: Diagnosis not present

## 2019-04-01 DIAGNOSIS — M25551 Pain in right hip: Secondary | ICD-10-CM | POA: Diagnosis not present

## 2019-04-01 DIAGNOSIS — I2510 Atherosclerotic heart disease of native coronary artery without angina pectoris: Secondary | ICD-10-CM | POA: Diagnosis not present

## 2019-04-01 DIAGNOSIS — I10 Essential (primary) hypertension: Secondary | ICD-10-CM | POA: Diagnosis not present

## 2019-04-01 NOTE — Patient Instructions (Signed)
DUE TO COVID-19 ONLY ONE VISITOR IS ALLOWED TO COME WITH YOU AND STAY IN THE WAITING ROOM ONLY DURING PRE OP AND PROCEDURE DAY OF SURGERY. THE 1 VISITOR MAY VISIT WITH YOU AFTER SURGERY IN YOUR PRIVATE ROOM DURING VISITING HOURS ONLY!  YOU NEED TO HAVE A COVID 19 TEST ON: 04/04/19 @_______ , THIS TEST MUST BE DONE BEFORE SURGERY, COME  [place] , [id number].  ([place]) ONCE YOUR COVID TEST IS COMPLETED, PLEASE BEGIN THE QUARANTINE INSTRUCTIONS AS OUTLINED IN YOUR HANDOUT.                Dian Situ    Your procedure is scheduled on: 04/08/19   Report to   [facility] Main  Entrance   Report to admitting at: 7:00 AM     Call this number if you have problems the morning of surgery [phone]    Remember:    [place], NO [place].     Take these medicines the morning of surgery with A SIP OF WATER: Amlodipine,Lipitor                                You may not have any metal on your body including hair pins and              piercings  Do not wear jewelry, lotions, powders or perfumes, deodorant             Men may shave face and neck.   Do not bring valuables to the hospital. [place].  Contacts, dentures or bridgework may not be worn into surgery.  Leave suitcase in the car. After surgery it may be brought to your room.     Patients discharged the day of surgery will not be allowed to drive home. IF YOU ARE HAVING SURGERY AND GOING HOME THE SAME DAY, YOU MUST HAVE AN ADULT TO DRIVE YOU HOME AND BE WITH YOU FOR 24 HOURS. YOU MAY GO HOME BY TAXI OR UBER OR ORTHERWISE, BUT AN ADULT MUST ACCOMPANY YOU HOME AND STAY WITH YOU FOR 24 HOURS.  Name and phone number of your driver:  Special Instructions: N/A              Please read over the following fact sheets you were  given: _____________________________________________________________________             NO SOLID FOOD AFTER MIDNIGHT THE NIGHT PRIOR TO SURGERY. NOTHING BY MOUTH EXCEPT CLEAR LIQUIDS UNTIL: 6:40 am . PLEASE FINISH ENSURE DRINK PER SURGEON ORDER  WHICH NEEDS TO BE COMPLETED AT: 6:40 am .   CLEAR LIQUID DIET   Foods Allowed                                                                     Foods Excluded  Coffee and tea, regular and decaf  liquids that you cannot  Plain Jell-O any favor except red or purple                                           see through such as: Fruit ices (not with fruit pulp)                                     milk, soups, orange juice  Iced Popsicles                                    All solid food Carbonated beverages, regular and diet                                    Cranberry, grape and apple juices Sports drinks like Gatorade Lightly seasoned clear broth or consume(fat free) Sugar, honey syrup  Sample Menu Breakfast                                Lunch                                     Supper Cranberry juice                    Beef broth                            Chicken broth Jell-O                                     Grape juice                           Apple juice Coffee or tea                        Jell-O                                      Popsicle                                                Coffee or tea                        Coffee or tea  _____________________________________________________________________  [facility] - Preparing for Surgery Before surgery, you can play an important role.  Because skin is not sterile, your skin needs to be as free of germs as possible.  You can reduce the number of germs on your skin by washing with CHG (chlorahexidine gluconate) soap before surgery.  CHG is an antiseptic cleaner which kills  germs and bonds with the skin to continue killing germs even after  washing. Please DO NOT use if you have an allergy to CHG or antibacterial soaps.  If your skin becomes reddened/irritated stop using the CHG and inform your nurse when you arrive at Short Stay. Do not shave (including legs and underarms) for at least 48 hours prior to the first CHG shower.  You may shave your face/neck. Please follow these instructions carefully:  1.  Shower with CHG Soap the night before surgery and the  morning of Surgery.  2.  If you choose to wash your hair, wash your hair first as usual with your  normal  shampoo.  3.  After you shampoo, rinse your hair and body thoroughly to remove the  shampoo.                           4.  Use CHG as you would any other liquid soap.  You can apply chg directly  to the skin and wash                       Gently with a scrungie or clean washcloth.  5.  Apply the CHG Soap to your body ONLY FROM THE NECK DOWN.   Do not use on face/ open                           Wound or open sores. Avoid contact with eyes, ears mouth and genitals (private parts).                       Wash face,  Genitals (private parts) with your normal soap.             6.  Wash thoroughly, paying special attention to the area where your surgery  will be performed.  7.  Thoroughly rinse your body with warm water from the neck down.  8.  DO NOT shower/wash with your normal soap after using and rinsing off  the CHG Soap.                9.  Pat yourself dry with a clean towel.            10.  Wear clean pajamas.            11.  Place clean sheets on your bed the night of your first shower and do not  sleep with pets. Day of Surgery : Do not apply any lotions/deodorants the morning of surgery.  Please wear clean clothes to the hospital/surgery center.  FAILURE TO FOLLOW THESE INSTRUCTIONS MAY RESULT IN THE CANCELLATION OF YOUR SURGERY PATIENT SIGNATURE_________________________________  NURSE  SIGNATURE__________________________________  ________________________________________________________________________   Adam Phenix  An incentive spirometer is a tool that can help keep your lungs clear and active. This tool measures how well you are filling your lungs with each breath. Taking long deep breaths may help reverse or decrease the chance of developing breathing (pulmonary) problems (especially infection) following:  A long period of time when you are unable to move or be active. BEFORE THE PROCEDURE   If the spirometer includes an indicator to show your best effort, your nurse or respiratory therapist will set it to a desired goal.  If possible, sit up straight or lean slightly forward. Try not to slouch.  Hold the incentive spirometer in an  upright position. INSTRUCTIONS FOR USE  1. Sit on the edge of your bed if possible, or sit up as far as you can in bed or on a chair. 2. Hold the incentive spirometer in an upright position. 3. Breathe out normally. 4. Place the mouthpiece in your mouth and seal your lips tightly around it. 5. Breathe in slowly and as deeply as possible, raising the piston or the ball toward the top of the column. 6. Hold your breath for 3-5 seconds or for as long as possible. Allow the piston or ball to fall to the bottom of the column. 7. Remove the mouthpiece from your mouth and breathe out normally. 8. Rest for a few seconds and repeat Steps 1 through 7 at least 10 times every 1-2 hours when you are awake. Take your time and take a few normal breaths between deep breaths. 9. The spirometer may include an indicator to show your best effort. Use the indicator as a goal to work toward during each repetition. 10. After each set of 10 deep breaths, practice coughing to be sure your lungs are clear. If you have an incision (the cut made at the time of surgery), support your incision when coughing by placing a pillow or rolled up towels firmly  against it. Once you are able to get out of bed, walk around indoors and cough well. You may stop using the incentive spirometer when instructed by your caregiver.  RISKS AND COMPLICATIONS  Take your time so you do not get dizzy or light-headed.  If you are in pain, you may need to take or ask for pain medication before doing incentive spirometry. It is harder to take a deep breath if you are having pain. AFTER USE  Rest and breathe slowly and easily.  It can be helpful to keep track of a log of your progress. Your caregiver can provide you with a simple table to help with this. If you are using the spirometer at home, follow these instructions: [place] IF:   You are having difficultly using the spirometer.  You have trouble using the spirometer as often as instructed.  Your pain medication is not giving enough relief while using the spirometer.  You develop fever of 100.5 F (38.1 C) or higher. SEEK IMMEDIATE MEDICAL CARE IF:   You cough up bloody sputum that had not been present before.  You develop fever of 102 F (38.9 C) or greater.  You develop worsening pain at or near the incision site. MAKE SURE YOU:   Understand these instructions.  Will watch your condition.  Will get help right away if you are not doing well or get worse. Document Released: 06/20/2006 Document Revised: 05/02/2011 Document Reviewed: 08/21/2006 [facility] Patient Information 2014 [place], [place].   ________________________________________________________________________

## 2019-04-02 ENCOUNTER — Ambulatory Visit: Payer: PPO | Admitting: Cardiovascular Disease

## 2019-04-02 ENCOUNTER — Other Ambulatory Visit: Payer: Self-pay

## 2019-04-02 ENCOUNTER — Encounter (HOSPITAL_COMMUNITY): Payer: Self-pay

## 2019-04-02 ENCOUNTER — Ambulatory Visit (HOSPITAL_COMMUNITY)
Admission: RE | Admit: 2019-04-02 | Discharge: 2019-04-02 | Disposition: A | Discharge status: Home / Self Care | Payer: PPO | Source: Ambulatory Visit | Attending: Orthopedic Surgery | Admitting: Orthopedic Surgery

## 2019-04-02 ENCOUNTER — Encounter (HOSPITAL_COMMUNITY)
Admission: RE | Admit: 2019-04-02 | Discharge: 2019-04-02 | Disposition: A | Discharge status: Home / Self Care | Payer: PPO | Source: Ambulatory Visit | Attending: Orthopedic Surgery | Admitting: Orthopedic Surgery

## 2019-04-02 DIAGNOSIS — K219 Gastro-esophageal reflux disease without esophagitis: Secondary | ICD-10-CM | POA: Insufficient documentation

## 2019-04-02 DIAGNOSIS — N289 Disorder of kidney and ureter, unspecified: Secondary | ICD-10-CM | POA: Insufficient documentation

## 2019-04-02 DIAGNOSIS — Z01818 Encounter for other preprocedural examination: Secondary | ICD-10-CM

## 2019-04-02 DIAGNOSIS — Z79899 Other long term (current) drug therapy: Secondary | ICD-10-CM | POA: Insufficient documentation

## 2019-04-02 DIAGNOSIS — I10 Essential (primary) hypertension: Secondary | ICD-10-CM | POA: Insufficient documentation

## 2019-04-02 DIAGNOSIS — Z8546 Personal history of malignant neoplasm of prostate: Secondary | ICD-10-CM | POA: Diagnosis not present

## 2019-04-02 DIAGNOSIS — Z7982 Long term (current) use of aspirin: Secondary | ICD-10-CM | POA: Insufficient documentation

## 2019-04-02 DIAGNOSIS — M1712 Unilateral primary osteoarthritis, left knee: Secondary | ICD-10-CM | POA: Diagnosis not present

## 2019-04-02 LAB — URINALYSIS, ROUTINE W REFLEX MICROSCOPIC
Bacteria, UA: NONE SEEN
Bilirubin Urine: NEGATIVE
Glucose, UA: NEGATIVE mg/dL
Ketones, ur: NEGATIVE mg/dL
Leukocytes,Ua: NEGATIVE
Nitrite: NEGATIVE
Protein, ur: NEGATIVE mg/dL
Specific Gravity, Urine: 1.013 (ref 1.005–1.030)
pH: 5 (ref 5.0–8.0)

## 2019-04-02 LAB — PROTIME-INR
INR: 1 (ref 0.8–1.2)
Prothrombin Time: 13.6 seconds (ref 11.4–15.2)

## 2019-04-02 LAB — CBC WITH DIFFERENTIAL/PLATELET
Abs Immature Granulocytes: 0.01 10*3/uL (ref 0.00–0.07)
Basophils Absolute: 0 10*3/uL (ref 0.0–0.1)
Basophils Relative: 1 %
Eosinophils Absolute: 0.1 10*3/uL (ref 0.0–0.5)
Eosinophils Relative: 2 %
HCT: 50.5 % (ref 39.0–52.0)
Hemoglobin: 16.1 g/dL (ref 13.0–17.0)
Immature Granulocytes: 0 %
Lymphocytes Relative: 18 %
Lymphs Abs: 0.8 10*3/uL (ref 0.7–4.0)
MCH: 30.7 pg (ref 26.0–34.0)
MCHC: 31.9 g/dL (ref 30.0–36.0)
MCV: 96.2 fL (ref 80.0–100.0)
Monocytes Absolute: 0.4 10*3/uL (ref 0.1–1.0)
Monocytes Relative: 10 %
Neutro Abs: 3 10*3/uL (ref 1.7–7.7)
Neutrophils Relative %: 69 %
Platelets: 172 10*3/uL (ref 150–400)
RBC: 5.25 MIL/uL (ref 4.22–5.81)
RDW: 13 % (ref 11.5–15.5)
WBC: 4.3 10*3/uL (ref 4.0–10.5)
nRBC: 0 % (ref 0.0–0.2)

## 2019-04-02 LAB — BASIC METABOLIC PANEL
Anion gap: 8 (ref 5–15)
BUN: 24 mg/dL — ABNORMAL HIGH (ref 8–23)
CO2: 30 mmol/L (ref 22–32)
Calcium: 9.5 mg/dL (ref 8.9–10.3)
Chloride: 102 mmol/L (ref 98–111)
Creatinine, Ser: 1.65 mg/dL — ABNORMAL HIGH (ref 0.61–1.24)
GFR calc Af Amer: 45 mL/min — ABNORMAL LOW (ref >60)
GFR calc non Af Amer: 39 mL/min — ABNORMAL LOW (ref >60)
Glucose, Bld: 107 mg/dL — ABNORMAL HIGH (ref 70–99)
Potassium: 4.4 mmol/L (ref 3.5–5.1)
Sodium: 140 mmol/L (ref 135–145)

## 2019-04-02 LAB — ABO/RH: ABO/RH(D): A POS

## 2019-04-02 LAB — SURGICAL PCR SCREEN
MRSA, PCR: NEGATIVE
Staphylococcus aureus: NEGATIVE

## 2019-04-02 LAB — APTT: aPTT: 30 seconds (ref 24–36)

## 2019-04-02 NOTE — Progress Notes (Signed)
PCP - Allyn Kenner. LOV: 01/21/19 Cardiologist - Dr.Koneswaran. LOV: 01/22/19  Chest x-ray -  EKG - 03/28/19. EPIC Stress Test -  ECHO - 01/23/19. EPIC Cardiac Cath -   Sleep Study -  CPAP -   Fasting Blood Sugar -  Checks Blood Sugar _____ times a day  Blood Thinner Instructions: Aspirin Instructions:Pt. Said he did not received instructions about aspirin.RN recommended Pt. to call surgeons office, to get instructions. Last Dose:  Anesthesia review:   Patient denies shortness of breath, fever, cough and chest pain at PAT appointment   Patient verbalized understanding of instructions that were given to them at the PAT appointment. Patient was also instructed that they will need to review over the PAT instructions again at home before surgery.

## 2019-04-03 NOTE — Progress Notes (Signed)
Anesthesia Chart Review   Case: [id number] Date/Time: 04/08/19 0926   Procedure: LEFT TOTAL KNEE ARTHROPLASTY (Left Knee)   Anesthesia type: Spinal   Pre-op diagnosis: LEFT KNEE OSTEOARTHRITIS   Location: [place] [facility] / [facility]   Surgeons: Frederik Pear, MD      DISCUSSION:78 y.o. never smoker with h/o GERD, HTN, renal insufficiency, prostate cancer, left knee OA scheduled for above procedure 04/08/19 with Dr. Frederik Pear.   Pt last seen by cardiology 01/22/2019.  Pt complained of gradually increasing dyspnea on exertion at this visit stating he can only walk short distances before he needs to stop. Nuclear stress test ordered which demonstrated possible subtle diaphragmatic attenuation versus prior infarct with very mild peri-infarct ischemia.  Per note, "He has been experiencing progressive exertional dyspnea when doing yard work and other activities around the house.  I will obtain an echocardiogram and proceed with coronary CT angiography to evaluate for obstructive coronary artery disease." Echo 01/23/2019 with EF 60-65%.  CT angio cancelled by pt due to concern over rising COVID numbers, no follow up scheduled.  Cardiac clearance requested.   Addendum 04/04/2019:  Per Dr. Bronson Ing pt's symptoms have resolved and he is cleared for planned procedure.  VS: BP 125/67   Pulse 66   Temp 36.8 C (Oral)   Resp 16   Ht 6\' 3"  (1.905 m)   Wt 113.9 kg   SpO2 98%   BMI 31.37 kg/m   PROVIDERS: Celene Squibb, MD is PCP   Kate Sable, MD is Cardiologist  LABS: Elevated creatinine, stable (all labs ordered are listed, but only abnormal results are displayed)  Labs Reviewed  BASIC METABOLIC PANEL - Abnormal; Notable for the following components:      Result Value   Glucose, Bld 107 (*)    BUN 24 (*)    Creatinine, Ser 1.65 (*)    GFR calc non Af Amer 39 (*)    GFR calc Af Amer 45 (*)    All other components within normal limits  URINALYSIS, ROUTINE W REFLEX MICROSCOPIC - Abnormal;  Notable for the following components:   Hgb urine dipstick SMALL (*)    All other components within normal limits  SURGICAL PCR SCREEN  APTT  CBC WITH DIFFERENTIAL/PLATELET  PROTIME-INR  TYPE AND SCREEN  ABO/RH     IMAGES:   EKG: 01/22/2019 Rate 71 bpm Sinus rhythm with 1st degree AV block with premature atrial complexes with aberranct conduction  Right bundle branch block  Left anterior fascicular block  Bifascicular block  Left ventricular hypertrophy with repolarization abnormality Cannot rule out septal infarct, age undetermined   CV: Echo 01/23/2019 IMPRESSIONS    1. Left ventricular ejection fraction, by visual estimation, is 60 to  65%. The left ventricle has normal function. There is no left ventricular  hypertrophy.  2. Elevated left ventricular end-diastolic pressure.  3. Left ventricular diastolic parameters are consistent with Grade I  diastolic dysfunction (impaired relaxation).  4. Global right ventricle has normal systolic function.The right  ventricular size is normal. No increase in right ventricular wall  thickness.  5. Left atrial size was normal.  6. Right atrial size was normal.  7. Mild aortic valve annular calcification.  8. Mild mitral annular calcification.  9. The mitral valve is grossly normal. Trace mitral valve regurgitation.  10. The tricuspid valve is grossly normal. Tricuspid valve regurgitation  is trivial.  11. The aortic valve is tricuspid. Aortic valve regurgitation is mild.  Mild aortic valve sclerosis  without stenosis.  12. There is Mild calcification of the aortic valve.  13. There is Mild thickening of the aortic valve.  14. The pulmonic valve was grossly normal. Pulmonic valve regurgitation is  not visualized.  15. Aortic dilatation noted.  16. There is mild dilatation of the aortic root.  17. Normal pulmonary artery systolic pressure.  18. The interatrial septum was not well visualized. Past Medical History:   Diagnosis Date  . Anginal pain ([place])   . Arthritis   . Chronic back pain   . Complication of anesthesia    "hard to wake up after back surgery"  . GERD (gastroesophageal reflux disease)   . Helicobacter pylori gastritis    Treated  . Hx of adenomatous colonic polyps   . Hypertension   . IBS (irritable bowel syndrome)   . Numbness    left leg after back surgery  . Prostate CA ([place])    seed implants  . Renal insufficiency   . Schatzki's ring    History of    Past Surgical History:  Procedure Laterality Date  . APPENDECTOMY  age 2  . BACK SURGERY  02/08   [place]  . BACK SURGERY  09/24/04   lumbar, [facility]  . BACK SURGERY  09/18/02   [place]  . BILIARY STENT PLACEMENT  01/07/2012   Procedure: BILIARY STENT PLACEMENT;  Surgeon: Rogene Houston, MD;  Location: [facility];  Service: Endoscopy;  Laterality: N/A;  . BILIARY STENT PLACEMENT  02/21/2012   Procedure: BILIARY STENT PLACEMENT;  Surgeon: Rogene Houston, MD;  Location: [facility];  Service: Endoscopy;  Laterality: N/A;  Biliary Stent Replacement  . CATARACT EXTRACTION W/PHACO  11/22/2010   Procedure: CATARACT EXTRACTION PHACO AND INTRAOCULAR LENS PLACEMENT (IOC);  Surgeon: Williams Che;  Location: [facility];  Service: Ophthalmology;  Laterality: Right;  CDE: 6.81  . CHOLECYSTECTOMY  03/04/05   [facility], [place]. Gangrenous cholecystitis complicated by abscess requiring percutaneous drainage. He also had common duct stones requiring ERCP and sphincterotomy.  . COLONOSCOPY  August 2005   Scattered sigmoid diverticulosis, splenic flexure polyp. Hyperplastic  . COLONOSCOPY  1997   3 cm tubular adenoma and a sending colon  . COLONOSCOPY  02/01/2011   Rourk-friable anal canal, hyperplastic rectal polyp  . COLONOSCOPY N/A 08/15/2013   Procedure: COLONOSCOPY;  Surgeon: Rogene Houston, MD;  [facility];  Service: Endoscopy;  Laterality: N/A;  100  . COLONOSCOPY N/A 09/20/2018   Procedure: COLONOSCOPY;  Surgeon: Rogene Houston, MD;   [facility];  Service: Endoscopy;  Laterality: N/A;  12:00pm  . ERCP  03/02/05   [facility], [place]. Normal-appearing biliary tree (gallbladder not image), status post sphincterotomy with recovery of small pieces of stone material, status post balloon occlusion cholangiogram.  . ERCP  01/07/2012   Procedure: ENDOSCOPIC RETROGRADE CHOLANGIOPANCREATOGRAPHY (ERCP);  Surgeon: Rogene Houston, MD;  Location[facility];  Service: Endoscopy;  Laterality: N/A;  possible biliary stenting  . ERCP  02/21/2012   Procedure: ENDOSCOPIC RETROGRADE CHOLANGIOPANCREATOGRAPHY (ERCP);  Surgeon: Rogene Houston, MD;  Location: [facility];  Service: Endoscopy;  Laterality: N/A;  common bile duct stone and clip removed  . ERCP N/A 05/18/2012   Procedure: ENDOSCOPIC RETROGRADE CHOLANGIOPANCREATOGRAPHY (ERCP);  Surgeon: Rogene Houston, MD;  Location: [facility];  Service: Endoscopy;  Laterality: N/A;  stone and debri removal  . ESOPHAGOGASTRODUODENOSCOPY  August 2005   Erosive esophagitis and Schatzki ring, small hiatal hernia  . ESOPHAGOGASTRODUODENOSCOPY  01/2008  Mild reflux esophagitis, small hiatal hernia  . ESOPHAGOGASTRODUODENOSCOPY  02/01/2011   Rourk-erosive reflux esophagitis, small hiatal hernia  . ESOPHAGOGASTRODUODENOSCOPY  10/26/2011   Dr. Eli Phillips gastric submucosal petechia (bx-benign ulceration), juxta ampullary duodenal diverticulum and some mucosal edema involving 1st/2nd portion of duodenum with superficial erosions (bx-superficial ulceration/benign)  . ESOPHAGOGASTRODUODENOSCOPY  02/21/2012   Procedure: ESOPHAGOGASTRODUODENOSCOPY (EGD);  Surgeon: Rogene Houston, MD;  Location[facility];  Service: Endoscopy;  Laterality: N/A;  . EYE SURGERY    . Incomplete colonoscopy  December 2009   Left-sided diverticula, mid descending colon polyp, due to recurrent looping and redundancy exam was incomplete. It was felt that the mid colon was reached. Followup barium enema showed colon interposition between the  liver and the diaphragm, redundant sigmoid colon but no colon mass or polyp identified.. Pathology revealed tubular adenoma.  Marland Kitchen POLYPECTOMY  09/20/2018   Procedure: POLYPECTOMY;  Surgeon: Rogene Houston, MD;  [facility];  Service: Endoscopy;;  Hepatic flexure polyp cold snare, splenic flexure polyp  . SPHINCTEROTOMY  01/07/2012   Procedure: SPHINCTEROTOMY;  Surgeon: Rogene Houston, MD;  [facility];  Service: Endoscopy;  Laterality: N/A;  Extended  . SPYGLASS CHOLANGIOSCOPY  02/21/2012   Procedure: SPYGLASS CHOLANGIOSCOPY;  Surgeon: Rogene Houston, MD;  Location: [facility];  Service: Endoscopy;  Laterality: N/A;  . SPYGLASS CHOLANGIOSCOPY N/A 05/18/2012   Procedure: SPYGLASS CHOLANGIOSCOPY;  Surgeon: Rogene Houston, MD;  [facility];  Service: Endoscopy;  Laterality: N/A;  . TOTAL KNEE ARTHROPLASTY  03/21/2012   Procedure: TOTAL KNEE ARTHROPLASTY;  Surgeon: [name], MD;  Location: [place];  Service: Orthopedics;  Laterality: Right;  right knee arthroplasty    MEDICATIONS: . amLODipine (NORVASC) 10 MG tablet  . aspirin EC 81 MG tablet  . atorvastatin (LIPITOR) 10 MG tablet  . metoprolol tartrate (LOPRESSOR) 50 MG tablet   No current facility-administered medications for this encounter.    Maia Plan [facility] Pre-Surgical Testing [phone] 04/03/19  12:37 PM

## 2019-04-04 ENCOUNTER — Other Ambulatory Visit (HOSPITAL_COMMUNITY)
Admission: RE | Admit: 2019-04-04 | Discharge: 2019-04-04 | Disposition: A | Discharge status: Home / Self Care | Payer: PPO | Source: Ambulatory Visit | Attending: Orthopedic Surgery | Admitting: Orthopedic Surgery

## 2019-04-04 DIAGNOSIS — Z20822 Contact with and (suspected) exposure to covid-19: Secondary | ICD-10-CM | POA: Diagnosis not present

## 2019-04-04 DIAGNOSIS — Z01812 Encounter for preprocedural laboratory examination: Secondary | ICD-10-CM | POA: Diagnosis not present

## 2019-04-04 LAB — SARS CORONAVIRUS 2 (TAT 6-24 HRS): SARS Coronavirus 2: NEGATIVE

## 2019-04-04 NOTE — H&P (Signed)
TOTAL KNEE ADMISSION H&P  Patient is being admitted for left total knee arthroplasty.  Subjective:  Chief Complaint:left knee pain.  HPI: [name], 79 y.o. male, has a history of pain and functional disability in the left knee due to arthritis and has failed non-surgical conservative treatments for greater than 12 weeks to includeNSAID's and/or analgesics, corticosteriod injections, viscosupplementation injections, use of assistive devices, weight reduction as appropriate and activity modification.  Onset of symptoms was gradual, starting 4 years ago with gradually worsening course since that time. The patient noted no past surgery on the left knee(s).  Patient currently rates pain in the left knee(s) at 10 out of 10 with activity. Patient has night pain, worsening of pain with activity and weight bearing, pain that interferes with activities of daily living, pain with passive range of motion, crepitus and joint swelling.  Patient has evidence of subchondral sclerosis, periarticular osteophytes, joint subluxation and joint space narrowing by imaging studies.   There is no active infection.  Patient Active Problem List   Diagnosis Date Noted  . Colonic adenoma 05/01/2018  . Unilateral primary osteoarthritis, left knee 11/01/2017  . Abnormal cardiac function test 09/22/2015  . Chronic renal disease, stage III 09/22/2015  . Family history of coronary artery disease in mother 09/22/2015  . Bifascicular block 09/22/2015  . Hypertrophy of breast 11/26/2014  . Thoracic kyphosis 10/28/2013  . Hemiplegia affecting dominant side, late effect of cerebrovascular disease 10/28/2013  . Postoperative shock 10/28/2013  . Acute diastolic congestive heart failure ([place]) 10/28/2013  . Personal history of colonic polyps 07/23/2013  . Osteoarthritis of right knee 03/22/2012  . Common bile duct (CBD) stricture 01/16/2012  . Epigastric pain 01/19/2011  . Hx of adenomatous colonic polyps 01/19/2011  .  Constipation 01/19/2011  . Change in bowel function 01/19/2011  . Rectal bleeding 01/19/2011  . Thrombocytopenia ([place]) 01/19/2011   Past Medical History:  Diagnosis Date  . Anginal pain ([place])   . Arthritis   . Chronic back pain   . Complication of anesthesia    "hard to wake up after back surgery"  . GERD (gastroesophageal reflux disease)   . Helicobacter pylori gastritis    Treated  . Hx of adenomatous colonic polyps   . Hypertension   . IBS (irritable bowel syndrome)   . Numbness    left leg after back surgery  . Prostate CA ([place])    seed implants  . Renal insufficiency   . Schatzki's ring    History of    Past Surgical History:  Procedure Laterality Date  . APPENDECTOMY  age 14  . BACK SURGERY  02/08   [place]  . BACK SURGERY  09/24/04   lumbar, [facility]  . BACK SURGERY  09/18/02   [place]  . BILIARY STENT PLACEMENT  01/07/2012   Procedure: BILIARY STENT PLACEMENT;  Surgeon: Rogene Houston, MD;  Location: [facility];  Service: Endoscopy;  Laterality: N/A;  . BILIARY STENT PLACEMENT  02/21/2012   Procedure: BILIARY STENT PLACEMENT;  Surgeon: Rogene Houston, MD;  Location: [facility];  Service: Endoscopy;  Laterality: N/A;  Biliary Stent Replacement  . CATARACT EXTRACTION W/PHACO  11/22/2010   Procedure: CATARACT EXTRACTION PHACO AND INTRAOCULAR LENS PLACEMENT (IOC);  Surgeon: Williams Che;  Location: [facility];  Service: Ophthalmology;  Laterality: Right;  CDE: 6.81  . CHOLECYSTECTOMY  03/04/05   [facility], [facility]. Gangrenous cholecystitis complicated by abscess requiring percutaneous drainage. He also had common duct stones requiring ERCP and sphincterotomy.  Marland Kitchen  COLONOSCOPY  August 2005   Scattered sigmoid diverticulosis, splenic flexure polyp. Hyperplastic  . COLONOSCOPY  1997   3 cm tubular adenoma and a sending colon  . COLONOSCOPY  02/01/2011   Rourk-friable anal canal, hyperplastic rectal polyp  . COLONOSCOPY N/A 08/15/2013   Procedure: COLONOSCOPY;  Surgeon: Rogene Houston, MD;   [facility];  Service: Endoscopy;  Laterality: N/A;  100  . COLONOSCOPY N/A 09/20/2018   Procedure: COLONOSCOPY;  Surgeon: Rogene Houston, MD;  [facility];  Service: Endoscopy;  Laterality: N/A;  12:00pm  . ERCP  03/02/05   [facility], [facility]. Normal-appearing biliary tree (gallbladder not image), status post sphincterotomy with recovery of small pieces of stone material, status post balloon occlusion cholangiogram.  . ERCP  01/07/2012   Procedure: ENDOSCOPIC RETROGRADE CHOLANGIOPANCREATOGRAPHY (ERCP);  Surgeon: Rogene Houston, MD;  Location: [facility];  Service: Endoscopy;  Laterality: N/A;  possible biliary stenting  . ERCP  02/21/2012   Procedure: ENDOSCOPIC RETROGRADE CHOLANGIOPANCREATOGRAPHY (ERCP);  Surgeon: Rogene Houston, MD;  Location: [facility];  Service: Endoscopy;  Laterality: N/A;  common bile duct stone and clip removed  . ERCP N/A 05/18/2012   Procedure: ENDOSCOPIC RETROGRADE CHOLANGIOPANCREATOGRAPHY (ERCP);  Surgeon: Rogene Houston, MD;  Location: [facility];  Service: Endoscopy;  Laterality: N/A;  stone and debri removal  . ESOPHAGOGASTRODUODENOSCOPY  August 2005   Erosive esophagitis and Schatzki ring, small hiatal hernia  . ESOPHAGOGASTRODUODENOSCOPY  01/2008   Mild reflux esophagitis, small hiatal hernia  . ESOPHAGOGASTRODUODENOSCOPY  02/01/2011   Rourk-erosive reflux esophagitis, small hiatal hernia  . ESOPHAGOGASTRODUODENOSCOPY  10/26/2011   Dr. Eli  gastric submucosal petechia (bx-benign ulceration), juxta ampullary duodenal diverticulum and some mucosal edema involving 1st/2nd portion of duodenum with superficial erosions (bx-superficial ulceration/benign)  . ESOPHAGOGASTRODUODENOSCOPY  02/21/2012   Procedure: ESOPHAGOGASTRODUODENOSCOPY (EGD);  Surgeon: Rogene Houston, MD;  Location: [facility];  Service: Endoscopy;  Laterality: N/A;  . EYE SURGERY    . Incomplete colonoscopy  December 2009   Left-sided diverticula, mid descending colon polyp, due to  recurrent looping and redundancy exam was incomplete. It was felt that the mid colon was reached. Followup barium enema showed colon interposition between the liver and the diaphragm, redundant sigmoid colon but no colon mass or polyp identified.. Pathology revealed tubular adenoma.  Marland Kitchen POLYPECTOMY  09/20/2018   Procedure: POLYPECTOMY;  Surgeon: Rogene Houston, MD;  [facility];  Service: Endoscopy;;  Hepatic flexure polyp cold snare, splenic flexure polyp  . SPHINCTEROTOMY  01/07/2012   Procedure: SPHINCTEROTOMY;  Surgeon: Rogene Houston, MD;  [facility];  Service: Endoscopy;  Laterality: N/A;  Extended  . SPYGLASS CHOLANGIOSCOPY  02/21/2012   Procedure: SPYGLASS CHOLANGIOSCOPY;  Surgeon: Rogene Houston, MD;  Location: [facility];  Service: Endoscopy;  Laterality: N/A;  . SPYGLASS CHOLANGIOSCOPY N/A 05/18/2012   Procedure: SPYGLASS CHOLANGIOSCOPY;  Surgeon: Rogene Houston, MD;  Location: [facility];  Service: Endoscopy;  Laterality: N/A;  . TOTAL KNEE ARTHROPLASTY  03/21/2012   Procedure: TOTAL KNEE ARTHROPLASTY;  Surgeon: [name], MD;  Location: [facility] [place];  Service: Orthopedics;  Laterality: Right;  right knee arthroplasty    No current facility-administered medications for this encounter.   Current Outpatient Medications  Medication Sig Dispense Refill Last Dose  . amLODipine (NORVASC) 10 MG tablet TAKE ONE (1) TABLET BY MOUTH EVERY DAY (Patient taking differently: Take 10 mg by mouth daily. ) 90 tablet 1   . aspirin EC 81 MG tablet  Take 1 tablet (81 mg total) by mouth daily. 90 tablet 3   . atorvastatin (LIPITOR) 10 MG tablet Take 10 mg by mouth daily.     . metoprolol tartrate (LOPRESSOR) 50 MG tablet Take 1 tablet (50 mg total) by mouth once for 1 dose. (Patient not taking: Reported on 03/28/2019) 1 tablet 0 Not Taking at Unknown time   No Known Allergies  Social History   Tobacco Use  . Smoking status: Never Smoker  . Smokeless tobacco: Never Used  Substance Use Topics   . Alcohol use: No    Alcohol/week: 0.0 standard drinks    Family History  Problem Relation Age of Onset  . Diabetes Mother   . Coronary artery disease Mother   . Coronary artery disease Father   . Diabetes Father   . Stomach cancer Father   . Deep vein thrombosis Daughter   . Hypotension Neg Hx   . Anesthesia problems Neg Hx   . Malignant hyperthermia Neg Hx   . Pseudochol deficiency Neg Hx   . Colon cancer Neg Hx      Review of Systems  Constitutional: Negative.   HENT: Negative.   Eyes: Negative.   Respiratory: Negative.   Cardiovascular: Negative.   Gastrointestinal: Negative.   Endocrine: Negative.   Genitourinary: Negative.   Musculoskeletal: Positive for arthralgias and myalgias.  Skin: Negative.   Allergic/Immunologic: Negative.   Neurological: Negative.   Hematological: Negative.   Psychiatric/Behavioral: Negative.     Objective:  Physical Exam  Constitutional: He is oriented to person, place, and time. He appears well-developed and well-nourished.  HENT:  Head: Normocephalic and atraumatic.  Eyes: Pupils are equal, round, and reactive to light.  Cardiovascular: Intact distal pulses.  Respiratory: Effort normal.  Musculoskeletal:        General: Tenderness present.     Cervical back: Normal range of motion and neck supple.     Comments: patient has limited range of motion from roughly 10 to 110.  Pain through the medial joint line.  Obvious crepitance with range of motion.  Calves are soft and nontender.  Neurological: He is alert and oriented to person, place, and time.  Skin: Skin is warm and dry.  Psychiatric: He has a normal mood and affect. His behavior is normal. Judgment and thought content normal.    Vital signs in last 24 hours:    Labs:   Estimated body mass index is 31.37 kg/m as calculated from the following:   Height as of 04/02/19: 6\' 3"  (1.905 m).   Weight as of 04/02/19: 113.9 kg.   Imaging Review Plain radiographs demonstrate   bilateral AP weightbearing, bilateral Rosenberg, lateral sunrise views of the left knee are taken and reviewed in office today.  Patient has severe end-stage arthritis medial compartment of left knee with large periarticular periarticular osteophyte formation.  Patient's right total knee arthroplasty appears be well-placed and well fixed.   Assessment/Plan:  End stage arthritis, left knee   The patient history, physical examination, clinical judgment of the provider and imaging studies are consistent with end stage degenerative joint disease of the left knee(s) and total knee arthroplasty is deemed medically necessary. The treatment options including medical management, injection therapy arthroscopy and arthroplasty were discussed at length. The risks and benefits of total knee arthroplasty were presented and reviewed. The risks due to aseptic loosening, infection, stiffness, patella tracking problems, thromboembolic complications and other imponderables were discussed. The patient acknowledged the explanation, agreed to proceed with the plan  and consent was signed. Patient is being admitted for inpatient treatment for surgery, pain control, PT, OT, prophylactic antibiotics, VTE prophylaxis, progressive ambulation and ADL's and discharge planning. The patient is planning to be discharged home with home health services     Patient's anticipated LOS is less than 2 midnights, meeting these requirements: - Lives within 1 hour of care - Has a competent adult at home to recover with post-op recover - NO history of  - Chronic pain requiring opiods  - Diabetes  - Coronary Artery Disease  - Heart failure  - Heart attack  - Stroke  - DVT/VTE  - Cardiac arrhythmia  - Respiratory Failure/COPD  - Renal failure  - Anemia  - Advanced Liver disease

## 2019-04-07 MED ORDER — BUPIVACAINE LIPOSOME 1.3 % IJ SUSP
20.0000 mL | INTRAMUSCULAR | Status: DC
  Filled 2019-04-07: qty 20

## 2019-04-07 MED ORDER — TRANEXAMIC ACID 1000 MG/10ML IV SOLN
2000.0000 mg | INTRAVENOUS | Status: DC
  Filled 2019-04-07: qty 20

## 2019-04-08 ENCOUNTER — Ambulatory Visit (HOSPITAL_COMMUNITY)
Admission: RE | Admit: 2019-04-08 | Discharge: 2019-04-08 | Disposition: A | Discharge status: Home / Self Care | Payer: PPO | Source: Other Acute Inpatient Hospital | Attending: Orthopedic Surgery | Admitting: Orthopedic Surgery

## 2019-04-08 ENCOUNTER — Encounter (HOSPITAL_COMMUNITY)
Admission: RE | Disposition: A | Discharge status: Home / Self Care | Payer: Self-pay | Source: Other Acute Inpatient Hospital | Attending: Orthopedic Surgery

## 2019-04-08 ENCOUNTER — Ambulatory Visit (HOSPITAL_COMMUNITY): Payer: PPO | Admitting: Certified Registered"

## 2019-04-08 ENCOUNTER — Encounter (HOSPITAL_COMMUNITY): Payer: Self-pay | Admitting: Orthopedic Surgery

## 2019-04-08 ENCOUNTER — Ambulatory Visit (HOSPITAL_COMMUNITY): Payer: PPO | Admitting: Physician Assistant

## 2019-04-08 DIAGNOSIS — M21162 Varus deformity, not elsewhere classified, left knee: Secondary | ICD-10-CM | POA: Diagnosis not present

## 2019-04-08 DIAGNOSIS — Z8249 Family history of ischemic heart disease and other diseases of the circulatory system: Secondary | ICD-10-CM | POA: Diagnosis not present

## 2019-04-08 DIAGNOSIS — M1712 Unilateral primary osteoarthritis, left knee: Secondary | ICD-10-CM | POA: Diagnosis not present

## 2019-04-08 DIAGNOSIS — Z8546 Personal history of malignant neoplasm of prostate: Secondary | ICD-10-CM | POA: Diagnosis not present

## 2019-04-08 DIAGNOSIS — D696 Thrombocytopenia, unspecified: Secondary | ICD-10-CM | POA: Diagnosis not present

## 2019-04-08 DIAGNOSIS — I69951 Hemiplegia and hemiparesis following unspecified cerebrovascular disease affecting right dominant side: Secondary | ICD-10-CM | POA: Insufficient documentation

## 2019-04-08 DIAGNOSIS — Z7982 Long term (current) use of aspirin: Secondary | ICD-10-CM | POA: Insufficient documentation

## 2019-04-08 DIAGNOSIS — I5030 Unspecified diastolic (congestive) heart failure: Secondary | ICD-10-CM | POA: Diagnosis not present

## 2019-04-08 DIAGNOSIS — I130 Hypertensive heart and chronic kidney disease with heart failure and stage 1 through stage 4 chronic kidney disease, or unspecified chronic kidney disease: Secondary | ICD-10-CM | POA: Diagnosis not present

## 2019-04-08 DIAGNOSIS — M25762 Osteophyte, left knee: Secondary | ICD-10-CM | POA: Diagnosis not present

## 2019-04-08 DIAGNOSIS — G8918 Other acute postprocedural pain: Secondary | ICD-10-CM | POA: Diagnosis not present

## 2019-04-08 DIAGNOSIS — N183 Chronic kidney disease, stage 3 unspecified: Secondary | ICD-10-CM | POA: Insufficient documentation

## 2019-04-08 DIAGNOSIS — Z79899 Other long term (current) drug therapy: Secondary | ICD-10-CM | POA: Diagnosis not present

## 2019-04-08 HISTORY — PX: TOTAL KNEE ARTHROPLASTY: SHX125

## 2019-04-08 LAB — TYPE AND SCREEN
ABO/RH(D): A POS
Antibody Screen: NEGATIVE

## 2019-04-08 SURGERY — ARTHROPLASTY, KNEE, TOTAL
Anesthesia: Spinal | Site: Knee | Laterality: Left

## 2019-04-08 MED ORDER — DEXAMETHASONE SODIUM PHOSPHATE 10 MG/ML IJ SOLN
INTRAMUSCULAR | Status: AC
  Filled 2019-04-08: qty 1

## 2019-04-08 MED ORDER — ACETAMINOPHEN 10 MG/ML IV SOLN: 1000.0000 mg | Freq: Once | INTRAVENOUS | Status: DC | PRN

## 2019-04-08 MED ORDER — FENTANYL CITRATE (PF) 100 MCG/2ML IJ SOLN
50.0000 ug | INTRAMUSCULAR | Status: AC
  Administered 2019-04-08: 09:00:00 50 ug via INTRAVENOUS
  Filled 2019-04-08: qty 2

## 2019-04-08 MED ORDER — OXYCODONE HCL 5 MG PO TABS: 5.0000 mg | ORAL_TABLET | Freq: Once | ORAL | Status: DC | PRN

## 2019-04-08 MED ORDER — PROPOFOL 10 MG/ML IV BOLUS
INTRAVENOUS | Status: AC
  Filled 2019-04-08: qty 20

## 2019-04-08 MED ORDER — 0.9 % SODIUM CHLORIDE (POUR BTL) OPTIME
TOPICAL | Status: DC | PRN
  Administered 2019-04-08: 1000 mL

## 2019-04-08 MED ORDER — SODIUM CHLORIDE 0.9 % IR SOLN
Status: DC | PRN
  Administered 2019-04-08: 1000 mL

## 2019-04-08 MED ORDER — ASPIRIN EC 81 MG PO TBEC: 81.0000 mg | DELAYED_RELEASE_TABLET | Freq: Two times a day (BID) | ORAL | 0 refills | Status: DC

## 2019-04-08 MED ORDER — LACTATED RINGERS IV SOLN: INTRAVENOUS | Status: DC

## 2019-04-08 MED ORDER — TIZANIDINE HCL 2 MG PO TABS: 2.0000 mg | ORAL_TABLET | Freq: Four times a day (QID) | ORAL | 0 refills | Status: DC | PRN

## 2019-04-08 MED ORDER — PROPOFOL 500 MG/50ML IV EMUL
INTRAVENOUS | Status: AC
  Filled 2019-04-08: qty 50

## 2019-04-08 MED ORDER — SODIUM CHLORIDE (PF) 0.9 % IJ SOLN
INTRAMUSCULAR | Status: AC
  Filled 2019-04-08: qty 20

## 2019-04-08 MED ORDER — ONDANSETRON HCL 4 MG/2ML IJ SOLN
INTRAMUSCULAR | Status: AC
  Filled 2019-04-08: qty 2

## 2019-04-08 MED ORDER — PHENYLEPHRINE HCL (PRESSORS) 10 MG/ML IV SOLN
INTRAVENOUS | Status: AC
  Filled 2019-04-08: qty 1

## 2019-04-08 MED ORDER — POVIDONE-IODINE 10 % EX SWAB
2.0000 "application " | Freq: Once | CUTANEOUS | Status: AC
  Administered 2019-04-08: 2 via TOPICAL

## 2019-04-08 MED ORDER — ONDANSETRON HCL 4 MG/2ML IJ SOLN
INTRAMUSCULAR | Status: DC | PRN
  Administered 2019-04-08: 4 mg via INTRAVENOUS

## 2019-04-08 MED ORDER — CEFAZOLIN SODIUM-DEXTROSE 2-4 GM/100ML-% IV SOLN
2.0000 g | INTRAVENOUS | Status: AC
  Administered 2019-04-08: 10:00:00 2 g via INTRAVENOUS
  Filled 2019-04-08: qty 100

## 2019-04-08 MED ORDER — METHOCARBAMOL 500 MG IVPB - SIMPLE MED
500.0000 mg | Freq: Four times a day (QID) | INTRAVENOUS | Status: DC | PRN
  Administered 2019-04-08: 12:00:00 500 mg via INTRAVENOUS

## 2019-04-08 MED ORDER — HYDROMORPHONE HCL 1 MG/ML IJ SOLN
0.2500 mg | INTRAMUSCULAR | Status: DC | PRN
  Administered 2019-04-08 (×4): 0.25 mg via INTRAVENOUS

## 2019-04-08 MED ORDER — LACTATED RINGERS IV BOLUS
250.0000 mL | Freq: Once | INTRAVENOUS | Status: AC
  Administered 2019-04-08: 15:00:00 250 mL via INTRAVENOUS

## 2019-04-08 MED ORDER — METHOCARBAMOL 500 MG PO TABS: 500.0000 mg | ORAL_TABLET | Freq: Four times a day (QID) | ORAL | Status: DC | PRN

## 2019-04-08 MED ORDER — CHLORHEXIDINE GLUCONATE 4 % EX LIQD: 60.0000 mL | Freq: Once | CUTANEOUS | Status: DC

## 2019-04-08 MED ORDER — OXYCODONE HCL 5 MG PO TABS
5.0000 mg | ORAL_TABLET | ORAL | Status: DC | PRN
  Administered 2019-04-08: 13:00:00 10 mg via ORAL

## 2019-04-08 MED ORDER — PHENYLEPHRINE 40 MCG/ML (10ML) SYRINGE FOR IV PUSH (FOR BLOOD PRESSURE SUPPORT)
PREFILLED_SYRINGE | INTRAVENOUS | Status: DC | PRN
  Administered 2019-04-08: 40 ug via INTRAVENOUS

## 2019-04-08 MED ORDER — TRANEXAMIC ACID-NACL 1000-0.7 MG/100ML-% IV SOLN
1000.0000 mg | INTRAVENOUS | Status: AC
  Administered 2019-04-08: 1000 mg via INTRAVENOUS
  Filled 2019-04-08: qty 100

## 2019-04-08 MED ORDER — LACTATED RINGERS IV BOLUS
500.0000 mL | Freq: Once | INTRAVENOUS | Status: AC
  Administered 2019-04-08: 500 mL via INTRAVENOUS

## 2019-04-08 MED ORDER — FENTANYL CITRATE (PF) 100 MCG/2ML IJ SOLN
25.0000 ug | INTRAMUSCULAR | Status: DC | PRN
  Administered 2019-04-08: 12:00:00 50 ug via INTRAVENOUS
  Administered 2019-04-08 (×2): 25 ug via INTRAVENOUS

## 2019-04-08 MED ORDER — OXYCODONE HCL 5 MG PO TABS
ORAL_TABLET | ORAL | Status: AC
  Filled 2019-04-08: qty 2

## 2019-04-08 MED ORDER — ONDANSETRON HCL 4 MG/2ML IJ SOLN: 4.0000 mg | Freq: Once | INTRAMUSCULAR | Status: DC | PRN

## 2019-04-08 MED ORDER — TRANEXAMIC ACID-NACL 1000-0.7 MG/100ML-% IV SOLN
INTRAVENOUS | Status: AC
  Filled 2019-04-08: qty 100

## 2019-04-08 MED ORDER — BUPIVACAINE HCL 0.25 % IJ SOLN
INTRAMUSCULAR | Status: DC | PRN
  Administered 2019-04-08: 30 mL

## 2019-04-08 MED ORDER — DEXAMETHASONE SODIUM PHOSPHATE 10 MG/ML IJ SOLN
INTRAMUSCULAR | Status: DC | PRN
  Administered 2019-04-08: 5 mg via INTRAVENOUS

## 2019-04-08 MED ORDER — MIDAZOLAM HCL 2 MG/2ML IJ SOLN
1.0000 mg | INTRAMUSCULAR | Status: DC
  Filled 2019-04-08: qty 2

## 2019-04-08 MED ORDER — METHOCARBAMOL 500 MG IVPB - SIMPLE MED
INTRAVENOUS | Status: AC
  Filled 2019-04-08: qty 50

## 2019-04-08 MED ORDER — TRANEXAMIC ACID 1000 MG/10ML IV SOLN
INTRAVENOUS | Status: DC | PRN
  Administered 2019-04-08: 2000 mg via TOPICAL

## 2019-04-08 MED ORDER — BUPIVACAINE LIPOSOME 1.3 % IJ SUSP
INTRAMUSCULAR | Status: DC | PRN
  Administered 2019-04-08: 20 mL

## 2019-04-08 MED ORDER — FENTANYL CITRATE (PF) 100 MCG/2ML IJ SOLN
INTRAMUSCULAR | Status: AC
  Filled 2019-04-08: qty 2

## 2019-04-08 MED ORDER — FENTANYL CITRATE (PF) 100 MCG/2ML IJ SOLN
INTRAMUSCULAR | Status: DC | PRN
  Administered 2019-04-08 (×8): 25 ug via INTRAVENOUS

## 2019-04-08 MED ORDER — HYDROMORPHONE HCL 1 MG/ML IJ SOLN
INTRAMUSCULAR | Status: AC
  Administered 2019-04-08: 0.25 mg via INTRAVENOUS
  Filled 2019-04-08: qty 1

## 2019-04-08 MED ORDER — PROPOFOL 10 MG/ML IV BOLUS
INTRAVENOUS | Status: DC | PRN
  Administered 2019-04-08: 20 mg via INTRAVENOUS
  Administered 2019-04-08: 110 mg via INTRAVENOUS
  Administered 2019-04-08: 20 mg via INTRAVENOUS

## 2019-04-08 MED ORDER — PHENYLEPHRINE HCL-NACL 10-0.9 MG/250ML-% IV SOLN
INTRAVENOUS | Status: DC | PRN
  Administered 2019-04-08: 20 ug/min via INTRAVENOUS

## 2019-04-08 MED ORDER — TRANEXAMIC ACID-NACL 1000-0.7 MG/100ML-% IV SOLN
1000.0000 mg | Freq: Once | INTRAVENOUS | Status: AC
  Administered 2019-04-08: 15:00:00 1000 mg via INTRAVENOUS

## 2019-04-08 MED ORDER — HYDROMORPHONE HCL 1 MG/ML IJ SOLN
INTRAMUSCULAR | Status: AC
  Administered 2019-04-08: 13:00:00 0.5 mg via INTRAVENOUS
  Filled 2019-04-08: qty 1

## 2019-04-08 MED ORDER — OXYCODONE HCL 5 MG/5ML PO SOLN: 5.0000 mg | Freq: Once | ORAL | Status: DC | PRN

## 2019-04-08 MED ORDER — LIDOCAINE 2% (20 MG/ML) 5 ML SYRINGE
INTRAMUSCULAR | Status: AC
  Filled 2019-04-08: qty 5

## 2019-04-08 MED ORDER — FENTANYL CITRATE (PF) 100 MCG/2ML IJ SOLN
INTRAMUSCULAR | Status: AC
  Administered 2019-04-08: 50 ug via INTRAVENOUS
  Filled 2019-04-08: qty 4

## 2019-04-08 MED ORDER — ROPIVACAINE HCL 7.5 MG/ML IJ SOLN
INTRAMUSCULAR | Status: DC | PRN
  Administered 2019-04-08: 20 mL via PERINEURAL

## 2019-04-08 MED ORDER — ACETAMINOPHEN 10 MG/ML IV SOLN
INTRAVENOUS | Status: AC
  Administered 2019-04-08: 12:00:00 1000 mg via INTRAVENOUS
  Filled 2019-04-08: qty 100

## 2019-04-08 MED ORDER — PROPOFOL 500 MG/50ML IV EMUL
INTRAVENOUS | Status: DC | PRN
  Administered 2019-04-08: 25 ug/kg/min via INTRAVENOUS

## 2019-04-08 MED ORDER — OXYCODONE-ACETAMINOPHEN 5-325 MG PO TABS: 1.0000 | ORAL_TABLET | ORAL | 0 refills | Status: DC | PRN

## 2019-04-08 MED ORDER — SODIUM CHLORIDE (PF) 0.9 % IJ SOLN
INTRAMUSCULAR | Status: DC | PRN
  Administered 2019-04-08: 70 mL

## 2019-04-08 MED ORDER — BUPIVACAINE HCL 0.25 % IJ SOLN
INTRAMUSCULAR | Status: AC
  Filled 2019-04-08: qty 1

## 2019-04-08 MED ORDER — PHENYLEPHRINE 40 MCG/ML (10ML) SYRINGE FOR IV PUSH (FOR BLOOD PRESSURE SUPPORT)
PREFILLED_SYRINGE | INTRAVENOUS | Status: AC
  Filled 2019-04-08: qty 10

## 2019-04-08 MED ORDER — TRANEXAMIC ACID-NACL 1000-0.7 MG/100ML-% IV SOLN
INTRAVENOUS | Status: AC
  Filled 2019-04-08: qty 200

## 2019-04-08 MED ORDER — LIDOCAINE 2% (20 MG/ML) 5 ML SYRINGE
INTRAMUSCULAR | Status: DC | PRN
  Administered 2019-04-08: 40 mg via INTRAVENOUS

## 2019-04-08 MED ORDER — SODIUM CHLORIDE (PF) 0.9 % IJ SOLN
INTRAMUSCULAR | Status: AC
  Filled 2019-04-08: qty 50

## 2019-04-08 SURGICAL SUPPLY — 55 items
ATTUNE MED DOME PAT 41 KNEE (Knees) ×1 IMPLANT
ATTUNE MED DOME PAT 41MM KNEE (Knees) ×1 IMPLANT
ATTUNE PS FEM LT SZ 8 CEM KNEE (Femur) ×2 IMPLANT
ATTUNE PSRP INSR SZ8 5 KNEE (Insert) ×1 IMPLANT
ATTUNE PSRP INSR SZ8 5MM KNEE (Insert) ×1 IMPLANT
BAG DECANTER FOR FLEXI CONT (MISCELLANEOUS) ×3 IMPLANT
BAG SPEC THK2 15X12 ZIP CLS (MISCELLANEOUS) ×1
BAG ZIPLOCK 12X15 (MISCELLANEOUS) ×3 IMPLANT
BASE TIBIAL ROT PLAT SZ 8 KNEE (Knees) IMPLANT
BLADE SAG 18X100X1.27 (BLADE) ×3 IMPLANT
BLADE SAW SGTL 11.0X1.19X90.0M (BLADE) ×3 IMPLANT
BLADE SURG SZ10 CARB STEEL (BLADE) ×2 IMPLANT
BNDG CMPR MED 10X6 ELC LF (GAUZE/BANDAGES/DRESSINGS) ×1
BNDG ELASTIC 6X10 VLCR STRL LF (GAUZE/BANDAGES/DRESSINGS) ×3 IMPLANT
BOWL SMART MIX CTS (DISPOSABLE) ×3 IMPLANT
BSPLAT TIB 8 CMNT ROT PLAT STR (Knees) ×1 IMPLANT
CEMENT HV SMART SET (Cement) ×6 IMPLANT
COVER SURGICAL LIGHT HANDLE (MISCELLANEOUS) ×3 IMPLANT
COVER WAND RF STERILE (DRAPES) IMPLANT
CUFF TOURN SGL QUICK 34 (TOURNIQUET CUFF) ×3
CUFF TRNQT CYL 34X4.125X (TOURNIQUET CUFF) ×1 IMPLANT
DECANTER SPIKE VIAL GLASS SM (MISCELLANEOUS) ×9 IMPLANT
DRAPE U-SHAPE 47X51 STRL (DRAPES) ×3 IMPLANT
DRSG AQUACEL AG ADV 3.5X10 (GAUZE/BANDAGES/DRESSINGS) ×3 IMPLANT
DURAPREP 26ML APPLICATOR (WOUND CARE) ×3 IMPLANT
ELECT REM PT RETURN 15FT ADLT (MISCELLANEOUS) ×3 IMPLANT
GLOVE BIO SURGEON STRL SZ7.5 (GLOVE) ×3 IMPLANT
GLOVE BIO SURGEON STRL SZ8.5 (GLOVE) ×3 IMPLANT
GLOVE BIOGEL PI IND STRL 8 (GLOVE) ×1 IMPLANT
GLOVE BIOGEL PI IND STRL 9 (GLOVE) ×1 IMPLANT
GLOVE BIOGEL PI INDICATOR 8 (GLOVE) ×2
GLOVE BIOGEL PI INDICATOR 9 (GLOVE) ×2
GOWN STRL REUS W/TWL XL LVL3 (GOWN DISPOSABLE) ×6 IMPLANT
HANDPIECE INTERPULSE COAX TIP (DISPOSABLE)
HOOD PEEL AWAY FLYTE STAYCOOL (MISCELLANEOUS) ×9 IMPLANT
KIT TURNOVER KIT A (KITS) ×2 IMPLANT
NDL HYPO 21X1.5 SAFETY (NEEDLE) ×2 IMPLANT
NEEDLE HYPO 21X1.5 SAFETY (NEEDLE) ×6 IMPLANT
NS IRRIG 1000ML POUR BTL (IV SOLUTION) ×1 IMPLANT
PACK ICE MAXI GEL EZY WRAP (MISCELLANEOUS) ×1 IMPLANT
PACK TOTAL KNEE CUSTOM (KITS) ×3 IMPLANT
PENCIL SMOKE EVACUATOR (MISCELLANEOUS) ×2 IMPLANT
PIN STEINMAN FIXATION KNEE (PIN) ×2 IMPLANT
PROTECTOR NERVE ULNAR (MISCELLANEOUS) ×3 IMPLANT
SET HNDPC FAN SPRY TIP SCT (DISPOSABLE) ×1 IMPLANT
SUT VIC AB 1 CTX 36 (SUTURE) ×3
SUT VIC AB 1 CTX36XBRD ANBCTR (SUTURE) ×1 IMPLANT
SUT VIC AB 3-0 CT1 27 (SUTURE) ×9
SUT VIC AB 3-0 CT1 TAPERPNT 27 (SUTURE) ×3 IMPLANT
SYR CONTROL 10ML LL (SYRINGE) ×6 IMPLANT
TIBIAL BASE ROT PLAT SZ 8 KNEE (Knees) ×3 IMPLANT
TRAY FOLEY MTR SLVR 16FR STAT (SET/KITS/TRAYS/PACK) ×3 IMPLANT
WATER STERILE IRR 1000ML POUR (IV SOLUTION) ×6 IMPLANT
WRAP KNEE MAXI GEL POST OP (GAUZE/BANDAGES/DRESSINGS) ×2 IMPLANT
YANKAUER SUCT BULB TIP 10FT TU (MISCELLANEOUS) ×3 IMPLANT

## 2019-04-08 NOTE — Anesthesia Procedure Notes (Signed)
Anesthesia Regional Block: Adductor canal block   Pre-Anesthetic Checklist: ,, timeout performed, Correct Patient, Correct Site, Correct Laterality, Correct Procedure, Correct Position, site marked, Risks and benefits discussed,  Surgical consent,  Pre-op evaluation,  At surgeon's request and post-op pain management  Laterality: Left  Prep: chloraprep       Needles:  Injection technique: Single-shot  Needle Type: Echogenic Needle     Needle Length: 10cm  Needle Gauge: 21     Additional Needles:   Narrative:  Start time: 04/08/2019 8:56 AM End time: 04/08/2019 8:59 AM Injection made incrementally with aspirations every 5 mL.  Performed by: Personally  Anesthesiologist: Audry Pili, MD  Additional Notes: No pain on injection. No increased resistance to injection. Injection made in 5cc increments. Good needle visualization. Patient tolerated the procedure well.

## 2019-04-08 NOTE — Transfer of Care (Signed)
Immediate Anesthesia Transfer of Care Note  Patient: [name]  Procedure(s) Performed: LEFT TOTAL KNEE ARTHROPLASTY (Left Knee)  Patient Location: PACU  Anesthesia Type:General  Level of Consciousness: awake and drowsy  Airway & Oxygen Therapy: Patient Spontanous Breathing and Patient connected to face mask oxygen  Post-op Assessment: Report given to RN and Post -op Vital signs reviewed and stable  Post vital signs: Reviewed and stable  Last Vitals:  Vitals Value Taken Time  BP 147/72 04/08/19 1149  Temp    Pulse 68 04/08/19 1153  Resp 14 04/08/19 1153  SpO2 100 % 04/08/19 1153  Vitals shown include unvalidated device data.  Last Pain:  Vitals:   04/08/19 0859  TempSrc:   PainSc: 0-No pain      Patients Stated Pain Goal: 1 ([id number])  Complications: No apparent anesthesia complications

## 2019-04-08 NOTE — Op Note (Signed)
PATIENT ID:      [name]  MRN:     [id number] DOB/AGE:    1941/01/14 / 79 y.o.       OPERATIVE REPORT   DATE OF PROCEDURE:  04/08/2019      PREOPERATIVE DIAGNOSIS:   LEFT KNEE OSTEOARTHRITIS      Estimated body mass index is 31.39 kg/m as calculated from the following:   Height as of this encounter: 6\' 3"  (1.905 m).   Weight as of this encounter: 113.9 kg.                                                       POSTOPERATIVE DIAGNOSIS:   LEFT KNEE OSTEOARTHRITIS                                                                       PROCEDURE:  Procedure(s): LEFT TOTAL KNEE ARTHROPLASTY Using DepuyAttune RP implants #8L Femur, #8Tibia, 5 mm Attune RP bearing, 41 Patella    SURGEON: [name]  ASSISTANT:   Kerry Hough. [employer]   (Present and scrubbed throughout the case, critical for assistance with exposure, retraction, instrumentation, and closure.)        ANESTHESIA: General, 20cc Exparel, 50cc 0.25% Marcaine EBL: 500 cc FLUID REPLACEMENT: 1800 cc crystaloid TOURNIQUET: DRAINS: None TRANEXAMIC ACID: 1gm IV, 2gm topical COMPLICATIONS:  None         INDICATIONS FOR PROCEDURE: The patient has  LEFT KNEE OSTEOARTHRITIS, Var deformities, XR shows bone on bone arthritis, lateral subluxation of tibia. Patient has failed all conservative measures including anti-inflammatory medicines, narcotics, attempts at exercise and weight loss, cortisone injections and viscosupplementation.  Risks and benefits of surgery have been discussed, questions answered.   DESCRIPTION OF PROCEDURE: The patient identified by armband, received  IV antibiotics, in the holding area at [facility]. Patient taken to the operating room, appropriate anesthetic monitors were attached, and General anesthesia was  induced. IV Tranexamic acid was given.Tourniquet applied high to the operative thigh. Lateral post and foot positioner applied to the table, the lower extremity was then prepped and draped in usual  sterile fashion from the toes to the tourniquet. Time-out procedure was performed. Kerry Hough. [facility] PAC, was present and scrubbed throughout the case, critical for assistance with, positioning, exposure, retraction, instrumentation, and closure.The skin and subcutaneous tissue along the incision was injected with 20 cc of a mixture of Exparel and Marcaine solution, using a 20-gauge by 1-1/2 inch needle. We began the operation, with the knee flexed 130 degrees, by making the anterior midline incision starting at handbreadth above the patella going over the patella 1 cm medial to and 4 cm distal to the tibial tubercle. Small bleeders in the skin and the subcutaneous tissue identified and cauterized. Transverse retinaculum was incised and reflected medially and a medial parapatellar arthrotomy was accomplished. the patella was everted and theprepatellar fat pad resected. The superficial medial collateral ligament was then elevated from anterior to posterior along the proximal flare of the tibia and anterior half of the menisci resected. The knee was  hyperflexed exposing bone on bone arthritis. Peripheral and notch osteophytes as well as the cruciate ligaments were then resected. We continued to work our way around posteriorly along the proximal tibia, and externally rotated the tibia subluxing it out from underneath the femur. A McHale PCL retractor was placed through the notch and a lateral Hohmann retractor placed, and we then entered the proximal tibia in line with the Depuy starter drill in line with the axis of the tibia followed by an intramedullary guide rod and 0-degree posterior slope cutting guide. The tibial cutting guide, 4 degree posterior sloped, was pinned into place allowing resection of 1 mm of bone medially and 12 mm of bone laterally. Satisfied with the tibial resection, we then entered the distal femur 2 mm anterior to the PCL origin with the intramedullary guide rod and applied the distal femoral  cutting guide set at 9 mm, with 5 degrees of valgus. This was pinned along the epicondylar axis. At this point, the distal femoral cut was accomplished without difficulty. We then sized for a #8L femoral component and pinned the guide in 3 degrees of external rotation. The chamfer cutting guide was pinned into place. The anterior, posterior, and chamfer cuts were accomplished without difficulty followed by the Attune RP box cutting guide and the box cut. We also removed posterior osteophytes from the posterior femoral condyles. The posterior capsule was injected with Exparel solution. The knee was brought into full extension. We checked our extension gap and fit a 5 mm bearing. Distracting in extension with a lamina spreader,  bleeders in the posterior capsule, Posterior medial and posterior lateral gutter were cauterized.  The transexamic acid-soaked sponge was then placed in the gap of the knee in extension. The knee was flexed 30. The posterior patella cut was accomplished with the 9.5 mm Attune cutting guide, sized for a 63mm dome, and the fixation pegs drilled.The knee was then once again hyperflexed exposing the proximal tibia. We sized for a # 8 tibial base plate, applied the smokestack and the conical reamer followed by the the Delta fin keel punch. We then hammered into place the Attune RP trial femoral component, drilled the lugs, inserted a  5 mm trial bearing, trial patellar button, and took the knee through range of motion from 0-130 degrees. Medial and lateral ligamentous stability was checked. No thumb pressure was required for patellar Tracking. The tourniquet was inflated to 358mm for 31min. All trial components were removed, mating surfaces irrigated with pulse lavage, and dried with suction and sponges. 10 cc of the Exparel solution was applied to the cancellus bone of the patella distal femur and proximal tibia.  After waiting 30 seconds, the bony surfaces were again, dried with sponges. A  double batch of DePuy HV cement was mixed and applied to all bony metallic mating surfaces except for the posterior condyles of the femur itself. In order, we hammered into place the tibial tray and removed excess cement, the femoral component and removed excess cement. The final Attune RP bearing was inserted, and the knee brought to full extension with compression. The patellar button was clamped into place, and excess cement removed. The knee was held at 30 flexion with compression, while the cement cured. The tourniquet was let down. The wound was irrigated out with normal saline solution pulse lavage. The rest of the Exparel was injected into the parapatellar arthrotomy, subcutaneous tissues, and periosteal tissues. The parapatellar arthrotomy was closed with running #1 Vicryl suture. The subcutaneous tissue with  0 and 2-0 undyed Vicryl suture, and the skin with running 3-0 SQ vicryl. An Aquacil and Ace wrap were applied. The patient was taken to recovery room without difficulty.   [name] 04/08/2019, 11:37 AM

## 2019-04-08 NOTE — Anesthesia Postprocedure Evaluation (Signed)
Anesthesia Post Note  Patient: OSCEOLA KOE  Procedure(s) Performed: LEFT TOTAL KNEE ARTHROPLASTY (Left Knee)     Patient location during evaluation: PACU Anesthesia Type: General Level of consciousness: awake and alert Pain management: pain level controlled Vital Signs Assessment: post-procedure vital signs reviewed and stable Respiratory status: spontaneous breathing, nonlabored ventilation and respiratory function stable Cardiovascular status: blood pressure returned to baseline and stable Postop Assessment: no apparent nausea or vomiting Anesthetic complications: no    Last Vitals:  Vitals:   04/08/19 1330 04/08/19 1345  BP: 124/66 (!) 122/57  Pulse: 77 78  Resp: 16 11  Temp:    SpO2: 96% 94%    Last Pain:  Vitals:   04/08/19 1315  TempSrc:   PainSc: [place] Jeziel Hoffmann

## 2019-04-08 NOTE — Anesthesia Procedure Notes (Signed)
Procedure Name: LMA Insertion Date/Time: 04/08/2019 9:31 AM Performed by: Eben Burow, CRNA Pre-anesthesia Checklist: Patient identified, Emergency Drugs available, Suction available, Patient being monitored and Timeout performed Patient Re-evaluated:Patient Re-evaluated prior to induction Oxygen Delivery Method: Circle system utilized Preoxygenation: Pre-oxygenation with 100% oxygen Induction Type: IV induction Ventilation: Mask ventilation without difficulty LMA: LMA inserted and LMA with gastric port inserted LMA Size: 5.0 Tube secured with: Tape Dental Injury: Teeth and Oropharynx as per pre-operative assessment

## 2019-04-08 NOTE — Evaluation (Signed)
Physical Therapy Evaluation Patient Details Name: [name] MRN: [id number] DOB: [birth year] Today's Date: 04/08/2019   History of Present Illness  s/p L TKA  Clinical Impression  Pt is s/p TKA resulting in the deficits listed below (see PT Problem List).  Pt doing well, dizzy initially however VSS, resolved; reviewed gait safety, HEP and stairs. Recommend close supervision at h ome initially for mobility; ready for d/c from PT standpoint.   Pt will benefit from skilled PT to increase their independence and safety with mobility to allow discharge to the venue listed below.     Follow Up Recommendations Follow surgeon's recommendation for DC plan and follow-up therapies    Equipment Recommendations  Rolling walker with 5" wheels;3in1 (PT)    Recommendations for Other Services       Precautions / Restrictions Precautions Precautions: Knee;Fall Precaution Booklet Issued: No Restrictions Weight Bearing Restrictions: No      Mobility  Bed Mobility Overal bed mobility: Needs Assistance Bed Mobility: Supine to Sit     Supine to sit: Min guard     General bed mobility comments: min/guard for safety  Transfers Overall transfer level: Needs assistance Equipment used: Rolling walker (2 wheeled) Transfers: Sit to/from Stand Sit to Stand: Min guard         General transfer comment: cues for hand placement  Ambulation/Gait Ambulation/Gait assistance: Min guard   Assistive device: Rolling walker (2 wheeled) Gait Pattern/deviations: Step-to pattern;Decreased stance time - left     General Gait Details: cues for sequence and RW position, to avoid LLE going past front of RW  Stairs Stairs: Yes Stairs assistance: Min assist Stair Management: Step to pattern;Forwards;One rail Right;Two rails(cane simulation/using L rail) Number of Stairs: 3 General stair comments: cues for sequence  Wheelchair Mobility    Modified Rankin (Stroke Patients Only)       Balance                                              Pertinent Vitals/Pain Pain Assessment: 0-10 Pain Location: left knee Pain Descriptors / Indicators: Grimacing;Sore Pain Intervention(s): Limited activity within patient's tolerance;Monitored during session    [place] expects to be discharged to:: Private residence Living Arrangements: Spouse/significant other Available Help at Discharge: Family Type of Home: House Home Access: Stairs to enter Entrance Stairs-Rails: Right Entrance Stairs-Number of Steps: 2 Home Layout: One level Home Equipment: Other (comment) Additional Comments: plans to borrow RW    Prior Function Level of Independence: Independent               Hand Dominance        Extremity/Trunk Assessment   Upper Extremity Assessment Upper Extremity Assessment: Overall WFL for tasks assessed    Lower Extremity Assessment Lower Extremity Assessment: LLE deficits/detail LLE Deficits / Details: ankle WFL, knee extension and hip flexion 2 to 2+/5       Communication   Communication: No difficulties  Cognition Arousal/Alertness: Awake/alert Behavior During Therapy: WFL for tasks assessed/performed Overall Cognitive Status: Within Functional Limits for tasks assessed                                        General Comments      Exercises Total Joint Exercises Ankle Circles/Pumps: AROM;Both;10 reps [employer]  Sets: 10 reps;Both;AROM Heel Slides: AROM;AAROM;Left;10 reps Hip ABduction/ADduction: 10 reps;AAROM;Left Straight Leg Raises: AAROM;10 reps   Assessment/Plan    PT Assessment All further PT needs can be met in the next venue of care  PT Problem List         PT Treatment Interventions      PT Goals (Current goals can be found in the Care Plan section)  Acute Rehab PT Goals Patient Stated Goal: home today PT Goal Formulation: All assessment and education complete, DC therapy    Frequency      Barriers to discharge        Co-evaluation               AM-PAC PT "6 Clicks" Mobility  Outcome Measure Help needed turning from your back to your side while in a flat bed without using bedrails?: A Little Help needed moving from lying on your back to sitting on the side of a flat bed without using bedrails?: A Little Help needed moving to and from a bed to a chair (including a wheelchair)?: A Little Help needed standing up from a chair using your arms (e.g., wheelchair or bedside chair)?: A Little Help needed to walk in hospital room?: A Little Help needed climbing 3-5 steps with a railing? : A Little 6 Click Score: 18    End of Session Equipment Utilized During Treatment: Gait belt Activity Tolerance: Patient tolerated treatment well Patient left: in chair;with call bell/phone within reach Nurse Communication: Mobility status PT Visit Diagnosis: Difficulty in walking, not elsewhere classified (R26.2)    Time: 5208-0223 PT Time Calculation (min) (ACUTE ONLY): 28 min   Charges:   PT Evaluation $PT Eval Low Complexity: 1 Low PT Treatments $Gait Training: 8-22 mins        Baxter Flattery, PT   Acute Rehab Dept [facility]): [phone]   04/08/2019   [facility] 04/08/2019, 5:09 PM

## 2019-04-08 NOTE — Progress Notes (Signed)
AssistedDr. Brock with left, ultrasound guided, adductor canal block. Side rails up, monitors on throughout procedure. See vital signs in flow sheet. Tolerated Procedure well.  

## 2019-04-08 NOTE — Interval H&P Note (Signed)
History and Physical Interval Note:  04/08/2019 6:49 AM  [name]  has presented today for surgery, with the diagnosis of LEFT KNEE OSTEOARTHRITIS.  The various methods of treatment have been discussed with the patient and family. After consideration of risks, benefits and other options for treatment, the patient has consented to  Procedure(s): LEFT TOTAL KNEE ARTHROPLASTY (Left) as a surgical intervention.  The patient's history has been reviewed, patient examined, no change in status, stable for surgery.  I have reviewed the patient's chart and labs.  Questions were answered to the patient's satisfaction.     [name]

## 2019-04-08 NOTE — Anesthesia Preprocedure Evaluation (Addendum)
Anesthesia Evaluation  Patient identified by MRN, date of birth, ID band Patient awake    Reviewed: Allergy & Precautions, NPO status , Patient's Chart, lab work & pertinent test results  History of Anesthesia Complications (+) PROLONGED EMERGENCE and history of anesthetic complications  Airway Mallampati: III  TM Distance: >3 FB Neck ROM: Full    Dental  (+) Dental Advisory Given, Teeth Intact   Pulmonary neg pulmonary ROS,    Pulmonary exam normal        Cardiovascular hypertension, Pt. on medications (-) anginaNormal cardiovascular exam(-) dysrhythmias    '20 TTE - EF 60 to 65%. Grade I diastolic dysfunction.Trace MR and TR. Mild AI. Mild aortic valve sclerosis without stenosis. There is mild dilatation of the aortic root.      Neuro/Psych  Left leg numbness after extensive back surgery  negative psych ROS   GI/Hepatic Neg liver ROS, GERD  Controlled, Hx Schatzki's ring IBS    Endo/Other   Obesity   Renal/GU Renal InsufficiencyRenal disease    Prostate cancer     Musculoskeletal  (+) Arthritis ,   Abdominal   Peds  Hematology negative hematology ROS (+)  INR 1.0 Plt 172k    Anesthesia Other Findings Covid neg 04/04/19   Reproductive/Obstetrics                           Anesthesia Physical Anesthesia Plan  ASA: III  Anesthesia Plan: Spinal   Post-op Pain Management:  Regional for Post-op pain   Induction:   PONV Risk Score and Plan: 1 and Treatment may vary due to age or medical condition and Propofol infusion  Airway Management Planned: Natural Airway and Simple Face Mask  Additional Equipment: None  Intra-op Plan:   Post-operative Plan:   Informed Consent: I have reviewed the patients History and Physical, chart, labs and discussed the procedure including the risks, benefits and alternatives for the proposed anesthesia with the patient or authorized  representative who has indicated his/her understanding and acceptance.       Plan Discussed with: CRNA and Anesthesiologist  Anesthesia Plan Comments: (Labs reviewed, platelets acceptable. Discussed risks and benefits of spinal, including spinal/epidural hematoma, infection, failed block (especially in light of back surgery with implanted hardware), and PDPH. Patient expressed understanding and wished to proceed. )      Anesthesia Quick Evaluation

## 2019-04-08 NOTE — Discharge Instructions (Addendum)

## 2019-04-09 ENCOUNTER — Encounter: Payer: Self-pay | Admitting: *Deleted

## 2019-04-10 DIAGNOSIS — Z471 Aftercare following joint replacement surgery: Secondary | ICD-10-CM | POA: Diagnosis not present

## 2019-04-10 DIAGNOSIS — R262 Difficulty in walking, not elsewhere classified: Secondary | ICD-10-CM | POA: Diagnosis not present

## 2019-04-10 DIAGNOSIS — I130 Hypertensive heart and chronic kidney disease with heart failure and stage 1 through stage 4 chronic kidney disease, or unspecified chronic kidney disease: Secondary | ICD-10-CM | POA: Diagnosis not present

## 2019-04-10 DIAGNOSIS — I5032 Chronic diastolic (congestive) heart failure: Secondary | ICD-10-CM | POA: Diagnosis not present

## 2019-04-10 DIAGNOSIS — N183 Chronic kidney disease, stage 3 unspecified: Secondary | ICD-10-CM | POA: Diagnosis not present

## 2019-04-10 DIAGNOSIS — M6281 Muscle weakness (generalized): Secondary | ICD-10-CM | POA: Diagnosis not present

## 2019-04-10 DIAGNOSIS — Z96652 Presence of left artificial knee joint: Secondary | ICD-10-CM | POA: Diagnosis not present

## 2019-04-22 DIAGNOSIS — I5032 Chronic diastolic (congestive) heart failure: Secondary | ICD-10-CM | POA: Diagnosis not present

## 2019-04-22 DIAGNOSIS — R262 Difficulty in walking, not elsewhere classified: Secondary | ICD-10-CM | POA: Diagnosis not present

## 2019-04-22 DIAGNOSIS — Z96652 Presence of left artificial knee joint: Secondary | ICD-10-CM | POA: Diagnosis not present

## 2019-04-22 DIAGNOSIS — M6281 Muscle weakness (generalized): Secondary | ICD-10-CM | POA: Diagnosis not present

## 2019-04-22 DIAGNOSIS — I130 Hypertensive heart and chronic kidney disease with heart failure and stage 1 through stage 4 chronic kidney disease, or unspecified chronic kidney disease: Secondary | ICD-10-CM | POA: Diagnosis not present

## 2019-04-22 DIAGNOSIS — N183 Chronic kidney disease, stage 3 unspecified: Secondary | ICD-10-CM | POA: Diagnosis not present

## 2019-04-22 DIAGNOSIS — Z471 Aftercare following joint replacement surgery: Secondary | ICD-10-CM | POA: Diagnosis not present

## 2019-04-23 DIAGNOSIS — Z471 Aftercare following joint replacement surgery: Secondary | ICD-10-CM | POA: Diagnosis not present

## 2019-04-23 DIAGNOSIS — Z96652 Presence of left artificial knee joint: Secondary | ICD-10-CM | POA: Diagnosis not present

## 2019-05-07 DIAGNOSIS — Z96652 Presence of left artificial knee joint: Secondary | ICD-10-CM | POA: Diagnosis not present

## 2019-05-09 ENCOUNTER — Other Ambulatory Visit: Payer: Self-pay

## 2019-05-09 ENCOUNTER — Ambulatory Visit (HOSPITAL_COMMUNITY): Payer: PPO | Attending: Orthopedic Surgery | Admitting: Physical Therapy

## 2019-05-09 DIAGNOSIS — R262 Difficulty in walking, not elsewhere classified: Secondary | ICD-10-CM | POA: Insufficient documentation

## 2019-05-09 DIAGNOSIS — M25662 Stiffness of left knee, not elsewhere classified: Secondary | ICD-10-CM | POA: Diagnosis not present

## 2019-05-09 DIAGNOSIS — M6281 Muscle weakness (generalized): Secondary | ICD-10-CM | POA: Insufficient documentation

## 2019-05-09 NOTE — Patient Instructions (Signed)
.   look for compression stockings at home - and wear as much as tolerated - you can look at [facility] or [facility] 20-67mmhg - of pressure  . gentle/light massage start at lower leg and work your way up your leg and use lotion to improve skin health - non fragrant lotion . elevate leg/knee above your heart

## 2019-05-09 NOTE — Therapy (Signed)
[place], [place], [address] Phone: [phone]   Fax:  [phone]  Physical Therapy Evaluation  Patient Details  Name: [name] MRN: [id number] Date of Birth: Apr 01, 1940 Referring Provider (PT): Frederik Pear   Encounter Date: 05/09/2019  PT End of Session - 05/09/19 1356    Visit Number  1    Number of Visits  12    Date for PT Re-Evaluation  06/20/19    Authorization Type  [employer], $10 copay, visits based on med. nec.    Authorization Time Period  POC dates 05/09/19 - 06/20/2019    Progress Note Due on Visit  10    PT Start Time  1400    PT Stop Time  1445    PT Time Calculation (min)  45 min    Activity Tolerance  Patient tolerated treatment well    Behavior During Therapy  WFL for tasks assessed/performed       Past Medical History:  Diagnosis Date  . Anginal pain ([place])   . Arthritis   . Chronic back pain   . Complication of anesthesia    "hard to wake up after back surgery"  . GERD (gastroesophageal reflux disease)   . Helicobacter pylori gastritis    Treated  . Hx of adenomatous colonic polyps   . Hypertension   . IBS (irritable bowel syndrome)   . Numbness    left leg after back surgery  . Prostate CA ([place])    seed implants  . Renal insufficiency   . Schatzki's ring    History of    Past Surgical History:  Procedure Laterality Date  . APPENDECTOMY  age 78  . BACK SURGERY  02/08   [place]  . BACK SURGERY  09/24/04   lumbar, [facility]  . BACK SURGERY  09/18/02   [place]  . BILIARY STENT PLACEMENT  01/07/2012   Procedure: BILIARY STENT PLACEMENT;  Surgeon: Rogene Houston, MD;  Location: [facility];  Service: Endoscopy;  Laterality: N/A;  . BILIARY STENT PLACEMENT  02/21/2012   Procedure: BILIARY STENT PLACEMENT;  Surgeon: Rogene Houston, MD;  Location: [facility];  Service: Endoscopy;  Laterality: N/A;  Biliary Stent Replacement  . CATARACT EXTRACTION W/PHACO  11/22/2010   Procedure:  CATARACT EXTRACTION PHACO AND INTRAOCULAR LENS PLACEMENT (IOC);  Surgeon: Williams Che;  Location: [facility];  Service: Ophthalmology;  Laterality: Right;  CDE: 6.81  . CHOLECYSTECTOMY  03/04/05   [facility], [place]. Gangrenous cholecystitis complicated by abscess requiring percutaneous drainage. He also had common duct stones requiring ERCP and sphincterotomy.  . COLONOSCOPY  August 2005   Scattered sigmoid diverticulosis, splenic flexure polyp. Hyperplastic  . COLONOSCOPY  1997   3 cm tubular adenoma and a sending colon  . COLONOSCOPY  02/01/2011   Rourk-friable anal canal, hyperplastic rectal polyp  . COLONOSCOPY N/A 08/15/2013   Procedure: COLONOSCOPY;  Surgeon: Rogene Houston, MD;  [facility];  Service: Endoscopy;  Laterality: N/A;  100  . COLONOSCOPY N/A 09/20/2018   Procedure: COLONOSCOPY;  Surgeon: Rogene Houston, MD;  [facility];  Service: Endoscopy;  Laterality: N/A;  12:00pm  . ERCP  03/02/05   [facility], [facility]. Normal-appearing biliary tree (gallbladder not image), status post sphincterotomy with recovery of small pieces of stone material, status post balloon occlusion cholangiogram.  . ERCP  01/07/2012   Procedure: ENDOSCOPIC RETROGRADE CHOLANGIOPANCREATOGRAPHY (ERCP);  Surgeon: Rogene Houston, MD;  Location: [facility];  Service: Endoscopy;  Laterality: N/A;  possible biliary stenting  . ERCP  02/21/2012   Procedure: ENDOSCOPIC RETROGRADE CHOLANGIOPANCREATOGRAPHY (ERCP);  Surgeon: Rogene Houston, MD;  Location: [facility];  Service: Endoscopy;  Laterality: N/A;  common bile duct stone and clip removed  . ERCP N/A 05/18/2012   Procedure: ENDOSCOPIC RETROGRADE CHOLANGIOPANCREATOGRAPHY (ERCP);  Surgeon: Rogene Houston, MD;  Location: [facility];  Service: Endoscopy;  Laterality: N/A;  stone and debri removal  . ESOPHAGOGASTRODUODENOSCOPY  August 2005   Erosive esophagitis and Schatzki ring, small hiatal hernia  . ESOPHAGOGASTRODUODENOSCOPY  01/2008   Mild reflux esophagitis,  small hiatal hernia  . ESOPHAGOGASTRODUODENOSCOPY  02/01/2011   Rourk-erosive reflux esophagitis, small hiatal hernia  . ESOPHAGOGASTRODUODENOSCOPY  10/26/2011   Dr. Eli Phillips gastric submucosal petechia (bx-benign ulceration), juxta ampullary duodenal diverticulum and some mucosal edema involving 1st/2nd portion of duodenum with superficial erosions (bx-superficial ulceration/benign)  . ESOPHAGOGASTRODUODENOSCOPY  02/21/2012   Procedure: ESOPHAGOGASTRODUODENOSCOPY (EGD);  Surgeon: Rogene Houston, MD;  Location: [facility];  Service: Endoscopy;  Laterality: N/A;  . EYE SURGERY    . Incomplete colonoscopy  December 2009   Left-sided diverticula, mid descending colon polyp, due to recurrent looping and redundancy exam was incomplete. It was felt that the mid colon was reached. Followup barium enema showed colon interposition between the liver and the diaphragm, redundant sigmoid colon but no colon mass or polyp identified.. Pathology revealed tubular adenoma.  Marland Kitchen POLYPECTOMY  09/20/2018   Procedure: POLYPECTOMY;  Surgeon: Rogene Houston, MD;  [facility];  Service: Endoscopy;;  Hepatic flexure polyp cold snare, splenic flexure polyp  . SPHINCTEROTOMY  01/07/2012   Procedure: SPHINCTEROTOMY;  Surgeon: Rogene Houston, MD;  Location: [facility];  Service: Endoscopy;  Laterality: N/A;  Extended  . SPYGLASS CHOLANGIOSCOPY  02/21/2012   Procedure: SPYGLASS CHOLANGIOSCOPY;  Surgeon: Rogene Houston, MD;  Location: [facility];  Service: Endoscopy;  Laterality: N/A;  . SPYGLASS CHOLANGIOSCOPY N/A 05/18/2012   Procedure: SPYGLASS CHOLANGIOSCOPY;  Surgeon: Rogene Houston, MD;  Location: [facility];  Service: Endoscopy;  Laterality: N/A;  . TOTAL KNEE ARTHROPLASTY  03/21/2012   Procedure: TOTAL KNEE ARTHROPLASTY;  Surgeon: [name], MD;  Location: [facility];  Service: Orthopedics;  Laterality: Right;  right knee arthroplasty  . TOTAL KNEE ARTHROPLASTY Left 04/08/2019   Procedure: LEFT TOTAL KNEE  ARTHROPLASTY;  Surgeon: Frederik Pear, MD;  Location: [facility];  Service: Orthopedics;  Laterality: Left;    There were no vitals filed for this visit.   Subjective Assessment - 05/09/19 1518    Subjective  Patient complains of chronic left knee pain. States he was receiving knee injections but they didn't help. He ultimately decided to have a knee replacement because the knee pain was so bad. States that he has been a pretty active guy, but it got to the point where he couldn't do things he used to do, like going up and down ladders. States that since the surgery he has been in terrible pain. Reports that his leg has been red, feels really tight and doesn't know if he can tolerate therapy. States that he feels like there is something wrong. Reports that the MD thought he had an infection so they put him on meds and it seemed to help a little bit but it is still very painful and red.    Pertinent History  prostate cancer, HTN, RTKA 2014, lumbar surgery    Limitations  Standing;Walking;House hold activities;Sitting  Patient Stated Goals  to have less pain and return to doing everything he needs to around the house.    Currently in Pain?  Yes    Pain Location  Leg    Pain Orientation  Left;Lower    Pain Descriptors / Indicators  Burning;Stabbing;Throbbing;Tightness    Pain Type  Surgical pain    Pain Radiating Towards  entire lower leg    Pain Onset  1 to 4 weeks ago    Pain Frequency  Constant    Aggravating Factors   trying to straighten his knee    Pain Relieving Factors  nothing         [facility] PT Assessment - 05/09/19 0001      Assessment   Medical Diagnosis  L TKA    Referring Provider (PT)  Frederik Pear    Prior Therapy  yes for previous knee surgery and HHPT for current knee      Precautions   Precautions  None      Restrictions   Weight Bearing Restrictions  Yes    LLE Weight Bearing  Weight bearing as tolerated      Balance Screen   Has the patient fallen in the past 6  months  No    Has the patient had a decrease in activity level because of a fear of falling?   Yes    Is the patient reluctant to leave their home because of a fear of falling?   No      Home Environment   Living Environment  Private residence    Available Help at Discharge  Family    Type of [place] to enter    Entrance Stairs-Number of Steps  2    [place]  One level    [place] - 2 wheels;[place] - single point      Prior Function   Level of Independence  Independent    Vocation  Retired      Charity fundraiser Status  Within Functional Limits for tasks assessed      Observation/Other Assessments   Observations  Redness noted throughout entire left leg and skin taught and painful to light touch. Redness reduced with elevation but immediately returned when leg was returned to dependent position.     Skin Integrity  Incision healing well with no signs of infection at the scar/incision site.     Focus on Therapeutic Outcomes (FOTO)   62% limitation       Observation/Other Assessments-Edema    Edema  Circumferential      Circumferential Edema   Circumferential - Right  knee joint line R 42.0 cm; mid belly of calf 42.3 cm     Circumferential - Left   knee joint line L 43.8 cm; mid belly of calf L 41.2cm      ROM / Strength   AROM / PROM / Strength  AROM;Strength      AROM   AROM Assessment Site  Knee    Right/Left Knee  Left;Right    Right Knee Extension  0    Right Knee Flexion  128    Left Knee Extension  18   pain, lacking   Left Knee Flexion  108   pain     Strength   Overall Strength Comments  good functional strength with transitional mobility, LE MMT not formally tested      Palpation   Palpation  comment  tenderness to light touch throghout entire left lower leg       Bed Mobility   Bed Mobility  Supine to Sit    Supine to Sit  Independent;Other (comment)   log rolling, increased time to perform      Transfers   Transfers  Sit to Stand;Stand to Sit    Sit to Stand  Without upper extremity assist;With armrests;6: Modified independent (Device/Increase time)    Stand to Sit  6: Modified independent (Device/Increase time);With upper extremity assist;With armrests      Ambulation/Gait   Ambulation/Gait  Yes    Ambulation/Gait Assistance  6: Modified independent (Device/Increase time)    Ambulation Distance (Feet)  306 Feet    Assistive device  None    Gait Pattern  Decreased dorsiflexion - left;Decreased hip/knee flexion - left;Decreased stance time - left;Decreased step length - left;Decreased step length - right;Left foot flat;Trunk flexed    Ambulation Surface  Level;Indoor    Gait velocity  decreased    Gait Comments  2MW                Objective measurements completed on examination: See above findings.      [facility] Adult PT Treatment/Exercise - 05/09/19 0001      Manual Therapy   Manual Therapy  Edema management    Manual therapy comments  all manual interventions performed independently of other interventions    Edema Management  edema mobilization with leg elevated - 15 minutes             PT Education - 05/09/19 1455    Education Details  on current presentation, on benefits of light massage, compression stockings and elevation    Person(s) Educated  Patient    Methods  Explanation;Handout    Comprehension  Verbalized understanding       PT Short Term Goals - 05/09/19 1501      PT SHORT TERM GOAL #1   Title  Patient would report at least 25% improvement in overall symptoms/function to improve QOL.    Time  3    Period  Weeks    Status  New    Target Date  05/30/19      PT SHORT TERM GOAL #2   Title  Patient will be independent in HEP to improve functional outcomes.    Time  3    Period  Weeks    Status  New    Target Date  05/30/19      PT SHORT TERM GOAL #3   Title  Patient will demonstrate at least 5-115 degrees of left knee ROM to improve  functional knee mobility    Time  3    Period  Weeks    Status  New    Target Date  05/30/19        PT Long Term Goals - 05/09/19 1501      PT LONG TERM GOAL #1   Title  Patient will demonstrate at least 2-120 degrees of left knee ROM to improve functional knee mobility    Time  6    Period  Weeks    Status  New    Target Date  06/20/19      PT LONG TERM GOAL #2   Title  Patient would report at least 50% improvement in overall symptoms/function to improve QOL.    Time  6    Period  Weeks    Status  New    Target  Date  06/20/19      PT LONG TERM GOAL #3   Title  Patient will score with < 41% limitation on FOTO to demonstrate improved functional mobility    Time  6    Period  Weeks    Status  New    Target Date  06/20/19             Plan - 05/09/19 1456    Clinical Impression Statement  Patient presents to therapy s/p left TKA on 04/08/19. He presents with good knee flexion, but limitations in knee extension, as well as redness and tightness throughout his entire lower left leg. Patient educated in signs and symptoms of DVT and what to do if he thinks he has one. Patient presents with 1 positive on Well's Criteria for DVT, which puts him at moderate risk of DVT, will follow up with MD to determine if ultrasound is warranted. Patient would greatly benefit from skilled physical therapy to focus on improving functional mobility and overall tolerance to daily activities.    Personal Factors and Comorbidities  Age;Comorbidity 1    Comorbidities  hx of prostate cancer, HTN, hx of lumbar surgery    Examination-Activity Limitations  Locomotion Level;Stand;Stairs;Squat;Sleep;Sit;Bed Mobility    Examination-Participation Restrictions  Cleaning;Community Activity;Dorita Sciara    Stability/Clinical Decision Making  Stable/Uncomplicated    Clinical Decision Making  Low    Rehab Potential  Good    PT Frequency  2x / week    PT Duration  6 weeks    PT Treatment/Interventions   ADLs/Self Care Home Management;Aquatic Therapy;Manual techniques;Therapeutic exercise;Therapeutic activities;Functional mobility training;Balance training;Moist Heat;Electrical Stimulation;Cryotherapy;Gait training;Stair training;DME Instruction;Neuromuscular re-education;Patient/family education;Dry needling;Passive range of motion    PT Next Visit Plan  edema mobilization, TKE, LE strengthening    PT Home Exercise Plan  elevation, compression stockings, light edema massage    Consulted and Agree with Plan of Care  Patient       Patient will benefit from skilled therapeutic intervention in order to improve the following deficits and impairments:  Abnormal gait, Decreased endurance, Decreased skin integrity, Decreased scar mobility, Decreased activity tolerance, Pain, Decreased knowledge of use of DME, Decreased strength, Increased edema, Decreased balance, Decreased mobility, Difficulty walking, Decreased range of motion  Visit Diagnosis: Difficulty in walking, not elsewhere classified  Decreased range of motion (ROM) of left knee  Muscle weakness (generalized)     Problem List Patient Active Problem List   Diagnosis Date Noted  . Colonic adenoma 05/01/2018  . Degenerative arthritis of left knee 11/01/2017  . Abnormal cardiac function test 09/22/2015  . Chronic renal disease, stage III 09/22/2015  . Family history of coronary artery disease in mother 09/22/2015  . Bifascicular block 09/22/2015  . Hypertrophy of breast 11/26/2014  . Thoracic kyphosis 10/28/2013  . Hemiplegia affecting dominant side, late effect of cerebrovascular disease 10/28/2013  . Postoperative shock 10/28/2013  . Acute diastolic congestive heart failure ([place]) 10/28/2013  . Personal history of colonic polyps 07/23/2013  . Osteoarthritis of right knee 03/22/2012  . Common bile duct (CBD) stricture 01/16/2012  . Epigastric pain 01/19/2011  . Hx of adenomatous colonic polyps 01/19/2011  . Constipation  01/19/2011  . Change in bowel function 01/19/2011  . Rectal bleeding 01/19/2011  . Thrombocytopenia ([place]) 01/19/2011   3:34 PM, 05/09/19 Jerene Pitch, DPT Physical Therapy with [facility]  [phone] office  [place] [address] [place], [place], [address] Phone: [phone]   Fax:  [phone]  Name: [name] MRN: [id number] Date of Birth: 09/02/1940

## 2019-05-10 ENCOUNTER — Other Ambulatory Visit (HOSPITAL_COMMUNITY): Payer: Self-pay | Admitting: Orthopedic Surgery

## 2019-05-10 ENCOUNTER — Ambulatory Visit (HOSPITAL_COMMUNITY)
Admission: RE | Admit: 2019-05-10 | Discharge: 2019-05-10 | Disposition: A | Discharge status: Home / Self Care | Payer: PPO | Source: Ambulatory Visit | Attending: Internal Medicine | Admitting: Internal Medicine

## 2019-05-10 DIAGNOSIS — M7989 Other specified soft tissue disorders: Secondary | ICD-10-CM | POA: Insufficient documentation

## 2019-05-10 DIAGNOSIS — M79605 Pain in left leg: Secondary | ICD-10-CM | POA: Insufficient documentation

## 2019-05-10 NOTE — Progress Notes (Signed)
Lower extremity venous has been completed.   Preliminary results in CV Proc.   Abram Sander 05/10/2019 2:06 PM

## 2019-05-14 ENCOUNTER — Telehealth (HOSPITAL_COMMUNITY): Payer: Self-pay | Admitting: Physical Therapy

## 2019-05-14 DIAGNOSIS — M25562 Pain in left knee: Secondary | ICD-10-CM | POA: Diagnosis not present

## 2019-05-14 DIAGNOSIS — Z471 Aftercare following joint replacement surgery: Secondary | ICD-10-CM | POA: Diagnosis not present

## 2019-05-14 NOTE — Telephone Encounter (Signed)
pt wants to be discharged from here because his MD wants him to come to the rehab where he is

## 2019-05-14 NOTE — Therapy (Signed)
[place], [place], [address] Phone: [phone]   Fax:  [phone]  Patient Details  Name: [name] MRN: [id number] Date of Birth: 1940/03/07 Referring Provider:  Frederik Pear, MD  Encounter Date: 05/09/2019  PHYSICAL THERAPY DISCHARGE SUMMARY  Visits from Start of Care:1  Current functional level related to goals / functional outcomes: Goals could not be reassessed secondary to MD wanting patient to be treated at their facilty   Remaining deficits: could not be reassessed secondary to MD wanting patient to be treated at their facilty    Education / Equipment: No additional education provided Plan: Patient agrees to discharge.  Patient goals were not met. Patient is being discharged due to                                                     ????? MD wanting patient to be treated at their facilty        .3:58 PM, 05/14/19 Jerene Pitch, DPT Physical Therapy with [facility]  [phone] office  [place] [address] [place], [place], [address] Phone: [phone]   Fax:  [phone]

## 2019-05-15 ENCOUNTER — Ambulatory Visit (HOSPITAL_COMMUNITY): Payer: PPO | Admitting: Physical Therapy

## 2019-05-17 ENCOUNTER — Ambulatory Visit (HOSPITAL_COMMUNITY): Payer: PPO | Admitting: Physical Therapy

## 2019-05-17 DIAGNOSIS — Z96652 Presence of left artificial knee joint: Secondary | ICD-10-CM | POA: Diagnosis not present

## 2019-05-17 DIAGNOSIS — M25662 Stiffness of left knee, not elsewhere classified: Secondary | ICD-10-CM | POA: Diagnosis not present

## 2019-05-20 ENCOUNTER — Ambulatory Visit (HOSPITAL_COMMUNITY): Payer: PPO | Admitting: Physical Therapy

## 2019-05-20 DIAGNOSIS — Z96652 Presence of left artificial knee joint: Secondary | ICD-10-CM | POA: Diagnosis not present

## 2019-05-20 DIAGNOSIS — M25662 Stiffness of left knee, not elsewhere classified: Secondary | ICD-10-CM | POA: Diagnosis not present

## 2019-05-23 ENCOUNTER — Encounter (HOSPITAL_COMMUNITY): Payer: Self-pay | Admitting: Physical Therapy

## 2019-05-27 ENCOUNTER — Encounter (HOSPITAL_COMMUNITY): Payer: Self-pay | Admitting: Physical Therapy

## 2019-05-28 DIAGNOSIS — M25662 Stiffness of left knee, not elsewhere classified: Secondary | ICD-10-CM | POA: Diagnosis not present

## 2019-05-28 DIAGNOSIS — M25562 Pain in left knee: Secondary | ICD-10-CM | POA: Diagnosis not present

## 2019-05-28 DIAGNOSIS — Z96652 Presence of left artificial knee joint: Secondary | ICD-10-CM | POA: Diagnosis not present

## 2019-05-28 DIAGNOSIS — Z9889 Other specified postprocedural states: Secondary | ICD-10-CM | POA: Diagnosis not present

## 2019-05-30 ENCOUNTER — Encounter (HOSPITAL_COMMUNITY): Payer: Self-pay | Admitting: Physical Therapy

## 2019-05-30 DIAGNOSIS — L905 Scar conditions and fibrosis of skin: Secondary | ICD-10-CM | POA: Diagnosis not present

## 2019-05-30 DIAGNOSIS — L821 Other seborrheic keratosis: Secondary | ICD-10-CM | POA: Diagnosis not present

## 2019-05-30 DIAGNOSIS — M25662 Stiffness of left knee, not elsewhere classified: Secondary | ICD-10-CM | POA: Diagnosis not present

## 2019-05-30 DIAGNOSIS — D485 Neoplasm of uncertain behavior of skin: Secondary | ICD-10-CM | POA: Diagnosis not present

## 2019-05-30 DIAGNOSIS — Z96652 Presence of left artificial knee joint: Secondary | ICD-10-CM | POA: Diagnosis not present

## 2019-05-30 DIAGNOSIS — I8312 Varicose veins of left lower extremity with inflammation: Secondary | ICD-10-CM | POA: Diagnosis not present

## 2019-05-30 DIAGNOSIS — L739 Follicular disorder, unspecified: Secondary | ICD-10-CM | POA: Diagnosis not present

## 2019-05-30 DIAGNOSIS — I872 Venous insufficiency (chronic) (peripheral): Secondary | ICD-10-CM | POA: Diagnosis not present

## 2019-05-30 DIAGNOSIS — I8311 Varicose veins of right lower extremity with inflammation: Secondary | ICD-10-CM | POA: Diagnosis not present

## 2019-06-03 ENCOUNTER — Encounter (HOSPITAL_COMMUNITY): Payer: Self-pay | Admitting: Physical Therapy

## 2019-06-04 ENCOUNTER — Ambulatory Visit: Payer: PPO | Admitting: Cardiovascular Disease

## 2019-06-04 DIAGNOSIS — Z96652 Presence of left artificial knee joint: Secondary | ICD-10-CM | POA: Diagnosis not present

## 2019-06-04 DIAGNOSIS — M25662 Stiffness of left knee, not elsewhere classified: Secondary | ICD-10-CM | POA: Diagnosis not present

## 2019-06-06 ENCOUNTER — Encounter (HOSPITAL_COMMUNITY): Payer: Self-pay | Admitting: Physical Therapy

## 2019-06-10 ENCOUNTER — Encounter (HOSPITAL_COMMUNITY): Payer: Self-pay | Admitting: Physical Therapy

## 2019-06-11 DIAGNOSIS — Z96652 Presence of left artificial knee joint: Secondary | ICD-10-CM | POA: Diagnosis not present

## 2019-06-11 DIAGNOSIS — Z9889 Other specified postprocedural states: Secondary | ICD-10-CM | POA: Diagnosis not present

## 2019-06-12 DIAGNOSIS — R1032 Left lower quadrant pain: Secondary | ICD-10-CM | POA: Diagnosis not present

## 2019-06-13 ENCOUNTER — Encounter (HOSPITAL_COMMUNITY): Payer: Self-pay | Admitting: Physical Therapy

## 2019-06-17 ENCOUNTER — Encounter (HOSPITAL_COMMUNITY): Payer: Self-pay | Admitting: Physical Therapy

## 2019-06-20 ENCOUNTER — Encounter (HOSPITAL_COMMUNITY): Payer: Self-pay | Admitting: Physical Therapy

## 2019-06-20 DIAGNOSIS — I2510 Atherosclerotic heart disease of native coronary artery without angina pectoris: Secondary | ICD-10-CM | POA: Diagnosis not present

## 2019-06-20 DIAGNOSIS — I129 Hypertensive chronic kidney disease with stage 1 through stage 4 chronic kidney disease, or unspecified chronic kidney disease: Secondary | ICD-10-CM | POA: Diagnosis not present

## 2019-06-20 DIAGNOSIS — Z8546 Personal history of malignant neoplasm of prostate: Secondary | ICD-10-CM | POA: Diagnosis not present

## 2019-06-20 DIAGNOSIS — E782 Mixed hyperlipidemia: Secondary | ICD-10-CM | POA: Diagnosis not present

## 2019-06-20 DIAGNOSIS — R7301 Impaired fasting glucose: Secondary | ICD-10-CM | POA: Diagnosis not present

## 2019-06-20 DIAGNOSIS — N183 Chronic kidney disease, stage 3 unspecified: Secondary | ICD-10-CM | POA: Diagnosis not present

## 2019-06-24 ENCOUNTER — Encounter (HOSPITAL_COMMUNITY): Payer: Self-pay | Admitting: Physical Therapy

## 2019-06-27 ENCOUNTER — Encounter (HOSPITAL_COMMUNITY): Payer: Self-pay | Admitting: Physical Therapy

## 2019-07-04 DIAGNOSIS — Z96652 Presence of left artificial knee joint: Secondary | ICD-10-CM | POA: Diagnosis not present

## 2019-07-04 DIAGNOSIS — Z471 Aftercare following joint replacement surgery: Secondary | ICD-10-CM | POA: Diagnosis not present

## 2019-07-08 DIAGNOSIS — E782 Mixed hyperlipidemia: Secondary | ICD-10-CM | POA: Diagnosis not present

## 2019-07-08 DIAGNOSIS — J029 Acute pharyngitis, unspecified: Secondary | ICD-10-CM | POA: Diagnosis not present

## 2019-07-08 DIAGNOSIS — K5752 Diverticulitis of both small and large intestine without perforation or abscess without bleeding: Secondary | ICD-10-CM | POA: Diagnosis not present

## 2019-07-08 DIAGNOSIS — I2510 Atherosclerotic heart disease of native coronary artery without angina pectoris: Secondary | ICD-10-CM | POA: Diagnosis not present

## 2019-07-08 DIAGNOSIS — J060 Acute laryngopharyngitis: Secondary | ICD-10-CM | POA: Diagnosis not present

## 2019-07-08 DIAGNOSIS — E669 Obesity, unspecified: Secondary | ICD-10-CM | POA: Diagnosis not present

## 2019-07-08 DIAGNOSIS — I129 Hypertensive chronic kidney disease with stage 1 through stage 4 chronic kidney disease, or unspecified chronic kidney disease: Secondary | ICD-10-CM | POA: Diagnosis not present

## 2019-07-08 DIAGNOSIS — E785 Hyperlipidemia, unspecified: Secondary | ICD-10-CM | POA: Diagnosis not present

## 2019-07-08 DIAGNOSIS — E6609 Other obesity due to excess calories: Secondary | ICD-10-CM | POA: Diagnosis not present

## 2019-07-08 DIAGNOSIS — J019 Acute sinusitis, unspecified: Secondary | ICD-10-CM | POA: Diagnosis not present

## 2019-07-08 DIAGNOSIS — I10 Essential (primary) hypertension: Secondary | ICD-10-CM | POA: Diagnosis not present

## 2019-07-08 DIAGNOSIS — H04129 Dry eye syndrome of unspecified lacrimal gland: Secondary | ICD-10-CM | POA: Diagnosis not present

## 2019-07-11 DIAGNOSIS — M25551 Pain in right hip: Secondary | ICD-10-CM | POA: Diagnosis not present

## 2019-07-11 DIAGNOSIS — W57XXXA Bitten or stung by nonvenomous insect and other nonvenomous arthropods, initial encounter: Secondary | ICD-10-CM | POA: Diagnosis not present

## 2019-07-11 DIAGNOSIS — I10 Essential (primary) hypertension: Secondary | ICD-10-CM | POA: Diagnosis not present

## 2019-07-11 DIAGNOSIS — E782 Mixed hyperlipidemia: Secondary | ICD-10-CM | POA: Diagnosis not present

## 2019-07-11 DIAGNOSIS — R002 Palpitations: Secondary | ICD-10-CM | POA: Diagnosis not present

## 2019-07-11 DIAGNOSIS — I2510 Atherosclerotic heart disease of native coronary artery without angina pectoris: Secondary | ICD-10-CM | POA: Diagnosis not present

## 2019-07-11 DIAGNOSIS — I129 Hypertensive chronic kidney disease with stage 1 through stage 4 chronic kidney disease, or unspecified chronic kidney disease: Secondary | ICD-10-CM | POA: Diagnosis not present

## 2019-07-11 DIAGNOSIS — Z8546 Personal history of malignant neoplasm of prostate: Secondary | ICD-10-CM | POA: Diagnosis not present

## 2019-07-11 DIAGNOSIS — R7301 Impaired fasting glucose: Secondary | ICD-10-CM | POA: Diagnosis not present

## 2019-07-11 DIAGNOSIS — M1712 Unilateral primary osteoarthritis, left knee: Secondary | ICD-10-CM | POA: Diagnosis not present

## 2019-07-11 DIAGNOSIS — Z0001 Encounter for general adult medical examination with abnormal findings: Secondary | ICD-10-CM | POA: Diagnosis not present

## 2019-07-11 DIAGNOSIS — M25552 Pain in left hip: Secondary | ICD-10-CM | POA: Diagnosis not present

## 2019-07-11 DIAGNOSIS — R7303 Prediabetes: Secondary | ICD-10-CM | POA: Diagnosis not present

## 2019-07-30 DIAGNOSIS — M79604 Pain in right leg: Secondary | ICD-10-CM | POA: Diagnosis not present

## 2019-09-04 DIAGNOSIS — M25551 Pain in right hip: Secondary | ICD-10-CM | POA: Diagnosis not present

## 2019-09-04 DIAGNOSIS — M1712 Unilateral primary osteoarthritis, left knee: Secondary | ICD-10-CM | POA: Diagnosis not present

## 2019-09-04 DIAGNOSIS — W57XXXA Bitten or stung by nonvenomous insect and other nonvenomous arthropods, initial encounter: Secondary | ICD-10-CM | POA: Diagnosis not present

## 2019-09-04 DIAGNOSIS — I129 Hypertensive chronic kidney disease with stage 1 through stage 4 chronic kidney disease, or unspecified chronic kidney disease: Secondary | ICD-10-CM | POA: Diagnosis not present

## 2019-09-04 DIAGNOSIS — E782 Mixed hyperlipidemia: Secondary | ICD-10-CM | POA: Diagnosis not present

## 2019-09-04 DIAGNOSIS — R7301 Impaired fasting glucose: Secondary | ICD-10-CM | POA: Diagnosis not present

## 2019-09-04 DIAGNOSIS — R7303 Prediabetes: Secondary | ICD-10-CM | POA: Diagnosis not present

## 2019-09-04 DIAGNOSIS — I2510 Atherosclerotic heart disease of native coronary artery without angina pectoris: Secondary | ICD-10-CM | POA: Diagnosis not present

## 2019-09-04 DIAGNOSIS — Z8546 Personal history of malignant neoplasm of prostate: Secondary | ICD-10-CM | POA: Diagnosis not present

## 2019-09-04 DIAGNOSIS — N189 Chronic kidney disease, unspecified: Secondary | ICD-10-CM | POA: Diagnosis not present

## 2019-09-04 DIAGNOSIS — M25552 Pain in left hip: Secondary | ICD-10-CM | POA: Diagnosis not present

## 2019-09-04 DIAGNOSIS — I10 Essential (primary) hypertension: Secondary | ICD-10-CM | POA: Diagnosis not present

## 2019-09-09 ENCOUNTER — Ambulatory Visit: Payer: PPO | Admitting: Cardiology

## 2019-09-09 ENCOUNTER — Encounter: Payer: Self-pay | Admitting: *Deleted

## 2019-09-09 ENCOUNTER — Encounter: Payer: Self-pay | Admitting: Cardiology

## 2019-09-09 VITALS — BP 120/60 | HR 64 | Ht 75.0 in | Wt 255.0 lb

## 2019-09-09 DIAGNOSIS — N1832 Chronic kidney disease, stage 3b: Secondary | ICD-10-CM | POA: Diagnosis not present

## 2019-09-09 DIAGNOSIS — I25119 Atherosclerotic heart disease of native coronary artery with unspecified angina pectoris: Secondary | ICD-10-CM | POA: Diagnosis not present

## 2019-09-09 DIAGNOSIS — E782 Mixed hyperlipidemia: Secondary | ICD-10-CM

## 2019-09-09 MED ORDER — NITROGLYCERIN 0.4 MG SL SUBL: 0.4000 mg | SUBLINGUAL_TABLET | SUBLINGUAL | 3 refills | Status: DC | PRN

## 2019-09-09 NOTE — Progress Notes (Signed)
Cardiology Office Note  Date: 09/09/2019   ID: [name], DOB 1940-03-23, MRN [id number]  PCP:  Celene Squibb, MD  Cardiologist:  Rozann Lesches, MD Electrophysiologist:  None   Chief Complaint  Patient presents with  . Cardiac follow-up    History of Present Illness: [name] is an 79 y.o. male former patient of Dr. Bronson Ing, last seen in December 2020.  He is presenting to establish follow-up with me, I reviewed extensive records and updated the chart.  He reports what sounds like stable exertional angina symptoms, no escalating pattern or worsening dyspnea on exertion.  He remains active with outdoor chores, no palpitations or syncope.  He does not have nitroglycerin for as needed use.  I reviewed his current medications, cardiac regimen includes aspirin, Norvasc, and Lipitor.  Interval lab work from Dr. Nevada Crane is being requested.  He has CKD stage IIIb, last creatinine 1.65 with GFR 39 as of February.  Lexiscan Myoview back in 2019 was abnormal showing evidence of possible inferior scar with mild peri-infarct ischemia.  Chart review findings interval discussion about a cardiac CTA following his last visit, however he decided he decided not to pursue it.  Past Medical History:  Diagnosis Date  . Arthritis   . Chronic back pain   . CKD (chronic kidney disease) stage 3, GFR 30-59 ml/min   . Essential hypertension   . GERD (gastroesophageal reflux disease)   . Helicobacter pylori gastritis    Treated  . Hx of adenomatous colonic polyps   . IBS (irritable bowel syndrome)   . Prostate CA ([place])    Seed implants  . Renal insufficiency   . Schatzki's ring     Past Surgical History:  Procedure Laterality Date  . APPENDECTOMY  age 46  . BACK SURGERY  02/08   [place]  . BACK SURGERY  09/24/04   lumbar, [facility]  . BACK SURGERY  09/18/02   [place]  . BILIARY STENT PLACEMENT  01/07/2012   Procedure: BILIARY STENT PLACEMENT;  Surgeon: Rogene Houston, MD;  Location: [facility];   Service: Endoscopy;  Laterality: N/A;  . BILIARY STENT PLACEMENT  02/21/2012   Procedure: BILIARY STENT PLACEMENT;  Surgeon: Rogene Houston, MD;  Location: [facility];  Service: Endoscopy;  Laterality: N/A;  Biliary Stent Replacement  . CATARACT EXTRACTION W/PHACO  11/22/2010   Procedure: CATARACT EXTRACTION PHACO AND INTRAOCULAR LENS PLACEMENT (IOC);  Surgeon: Williams Che;  Location: [facility];  Service: Ophthalmology;  Laterality: Right;  CDE: 6.81  . CHOLECYSTECTOMY  03/04/05   [facility], [place]. Gangrenous cholecystitis complicated by abscess requiring percutaneous drainage. He also had common duct stones requiring ERCP and sphincterotomy.  . COLONOSCOPY  August 2005   Scattered sigmoid diverticulosis, splenic flexure polyp. Hyperplastic  . COLONOSCOPY  1997   3 cm tubular adenoma and a sending colon  . COLONOSCOPY  02/01/2011   Rourk-friable anal canal, hyperplastic rectal polyp  . COLONOSCOPY N/A 08/15/2013   Procedure: COLONOSCOPY;  Surgeon: Rogene Houston, MD;  [facility];  Service: Endoscopy;  Laterality: N/A;  100  . COLONOSCOPY N/A 09/20/2018   Procedure: COLONOSCOPY;  Surgeon: Rogene Houston, MD;  [facility];  Service: Endoscopy;  Laterality: N/A;  12:00pm  . ERCP  03/02/05   [facility], [facility]. Normal-appearing biliary tree (gallbladder not image), status post sphincterotomy with recovery of small pieces of stone material, status post balloon occlusion cholangiogram.  . ERCP  01/07/2012   Procedure: ENDOSCOPIC  RETROGRADE CHOLANGIOPANCREATOGRAPHY (ERCP);  Surgeon: Rogene Houston, MD;  Location: [facility];  Service: Endoscopy;  Laterality: N/A;  possible biliary stenting  . ERCP  02/21/2012   Procedure: ENDOSCOPIC RETROGRADE CHOLANGIOPANCREATOGRAPHY (ERCP);  Surgeon: Rogene Houston, MD;  Location: [facility];  Service: Endoscopy;  Laterality: N/A;  common bile duct stone and clip removed  . ERCP N/A 05/18/2012   Procedure: ENDOSCOPIC RETROGRADE CHOLANGIOPANCREATOGRAPHY (ERCP);   Surgeon: Rogene Houston, MD;  Location: [facility];  Service: Endoscopy;  Laterality: N/A;  stone and debri removal  . ESOPHAGOGASTRODUODENOSCOPY  August 2005   Erosive esophagitis and Schatzki ring, small hiatal hernia  . ESOPHAGOGASTRODUODENOSCOPY  01/2008   Mild reflux esophagitis, small hiatal hernia  . ESOPHAGOGASTRODUODENOSCOPY  02/01/2011   Rourk-erosive reflux esophagitis, small hiatal hernia  . ESOPHAGOGASTRODUODENOSCOPY  10/26/2011   Dr. Eli Phillips gastric submucosal petechia (bx-benign ulceration), juxta ampullary duodenal diverticulum and some mucosal edema involving 1st/2nd portion of duodenum with superficial erosions (bx-superficial ulceration/benign)  . ESOPHAGOGASTRODUODENOSCOPY  02/21/2012   Procedure: ESOPHAGOGASTRODUODENOSCOPY (EGD);  Surgeon: Rogene Houston, MD;  Location: [facility];  Service: Endoscopy;  Laterality: N/A;  . EYE SURGERY    . Incomplete colonoscopy  December 2009   Left-sided diverticula, mid descending colon polyp, due to recurrent looping and redundancy exam was incomplete. It was felt that the mid colon was reached. Followup barium enema showed colon interposition between the liver and the diaphragm, redundant sigmoid colon but no colon mass or polyp identified.. Pathology revealed tubular adenoma.  Marland Kitchen POLYPECTOMY  09/20/2018   Procedure: POLYPECTOMY;  Surgeon: Rogene Houston, MD;  [facility];  Service: Endoscopy;;  Hepatic flexure polyp cold snare, splenic flexure polyp  . SPHINCTEROTOMY  01/07/2012   Procedure: SPHINCTEROTOMY;  Surgeon: Rogene Houston, MD;  Location: [facility];  Service: Endoscopy;  Laterality: N/A;  Extended  . SPYGLASS CHOLANGIOSCOPY  02/21/2012   Procedure: SPYGLASS CHOLANGIOSCOPY;  Surgeon: Rogene Houston, MD;  Location: [facility];  Service: Endoscopy;  Laterality: N/A;  . SPYGLASS CHOLANGIOSCOPY N/A 05/18/2012   Procedure: SPYGLASS CHOLANGIOSCOPY;  Surgeon: Rogene Houston, MD;  Location: [facility];  Service: Endoscopy;   Laterality: N/A;  . TOTAL KNEE ARTHROPLASTY  03/21/2012   Procedure: TOTAL KNEE ARTHROPLASTY;  Surgeon: [name], MD;  Location: [facility];  Service: Orthopedics;  Laterality: Right;  right knee arthroplasty  . TOTAL KNEE ARTHROPLASTY Left 04/08/2019   Procedure: LEFT TOTAL KNEE ARTHROPLASTY;  Surgeon: Frederik Pear, MD;  Location: [facility];  Service: Orthopedics;  Laterality: Left;    Current Outpatient Medications  Medication Sig Dispense Refill  . amLODipine (NORVASC) 10 MG tablet TAKE ONE (1) TABLET BY MOUTH EVERY DAY (Patient taking differently: Take 10 mg by mouth daily. ) 90 tablet 1  . aspirin EC 81 MG tablet Take 1 tablet (81 mg total) by mouth 2 (two) times daily. (Patient taking differently: Take 81 mg by mouth daily. ) 60 tablet 0  . atorvastatin (LIPITOR) 10 MG tablet Take 10 mg by mouth daily.    Marland Kitchen oxyCODONE-acetaminophen (PERCOCET/ROXICET) 5-325 MG tablet Take 1 tablet by mouth every 4 (four) hours as needed for severe pain. 30 tablet 0  . tiZANidine (ZANAFLEX) 2 MG tablet Take 1 tablet (2 mg total) by mouth every 6 (six) hours as needed. 60 tablet 0  . nitroGLYCERIN (NITROSTAT) 0.4 MG SL tablet Place 1 tablet (0.4 mg total) under the tongue every 5 (five) minutes as needed for chest pain. 25 tablet 3   No  current facility-administered medications for this visit.   Allergies:  Patient has no known allergies.   Social History: The patient  reports that he has never smoked. He has never used smokeless tobacco. He reports that he does not drink alcohol and does not use drugs.   Family History: The patient's family history includes Coronary artery disease in his father and mother; Deep vein thrombosis in his daughter; Diabetes in his father and mother; Stomach cancer in his father.   ROS:   No palpitations or syncope.  Physical Exam: VS:  BP 120/60   Pulse 64   Ht 6\' 3"  (1.905 m)   Wt 255 lb (115.7 kg)   SpO2 97%   BMI 31.87 kg/m , BMI Body mass index is 31.87 kg/m.  Wt  Readings from Last 3 Encounters:  09/09/19 255 lb (115.7 kg)  04/08/19 251 lb 1.7 oz (113.9 kg)  04/02/19 251 lb (113.9 kg)    General:  Elderly male, appears comfortable at rest. HEENT: Conjunctiva and lids normal, wearing a mask. Neck: Supple, no elevated JVP or carotid bruits, no thyromegaly. Lungs: Clear to auscultation, nonlabored breathing at rest. Cardiac: Regular rate and rhythm, no S3, soft systolic murmur, no pericardial rub. Abdomen: Soft, nontender, bowel sounds present. Extremities: No pitting edema, distal pulses 2+. Skin: Warm and dry. Musculoskeletal: No kyphosis. Neuropsychiatric: Alert and oriented x3, affect grossly appropriate.  ECG:  An ECG dated 01/22/2019 was personally reviewed today and demonstrated:  Sinus rhythm with prolonged PR interval, right bundle branch block, left anterior fascicular block, frequent PACs.  Recent Labwork: 04/02/2019: BUN 24; Creatinine, Ser 1.65; Hemoglobin 16.1; Platelets 172; Potassium 4.4; Sodium 140     Component Value Date/Time   CHOL 200 11/10/2015 0734   TRIG 208 (H) 11/10/2015 0734   HDL 34 (L) 11/10/2015 0734   CHOLHDL 5.9 (H) 11/10/2015 0734   VLDL 42 (H) 11/10/2015 0734   LDLCALC 124 11/10/2015 0734    Other Studies Reviewed Today:  Echocardiogram 01/23/2019: 1. Left ventricular ejection fraction, by visual estimation, is 60 to  65%. The left ventricle has normal function. There is no left ventricular  hypertrophy.  2. Elevated left ventricular end-diastolic pressure.  3. Left ventricular diastolic parameters are consistent with Grade I  diastolic dysfunction (impaired relaxation).  4. Global right ventricle has normal systolic function.The right  ventricular size is normal. No increase in right ventricular wall  thickness.  5. Left atrial size was normal.  6. Right atrial size was normal.  7. Mild aortic valve annular calcification.  8. Mild mitral annular calcification.  9. The mitral valve is grossly  normal. Trace mitral valve regurgitation.  10. The tricuspid valve is grossly normal. Tricuspid valve regurgitation  is trivial.  11. The aortic valve is tricuspid. Aortic valve regurgitation is mild.  Mild aortic valve sclerosis without stenosis.  12. There is Mild calcification of the aortic valve.  13. There is Mild thickening of the aortic valve.  14. The pulmonic valve was grossly normal. Pulmonic valve regurgitation is  not visualized.  15. Aortic dilatation noted.  16. There is mild dilatation of the aortic root.  17. Normal pulmonary artery systolic pressure.  18. The interatrial septum was not well visualized.  Lexiscan Myoview 09/06/2017:  Blood pressure demonstrated a normal response to exercise.  There was no ST segment deviation noted during stress.  This is a low risk study.  The left ventricular ejection fraction is normal (55-65%).  Moderate size mild intensity inferior defect with  very mild reversibility. The normal wall motion would suggest possible subdiaphragmatic attenuation, however cannot exclude prior infarct with very mild per-infarct ischemia. Either findings would support low risk.  Assessment and Plan:  1.  Ischemic heart disease based on previous noninvasive cardiac assessment.  Myoview from 2019 indicated possible inferior scar with mild peri-infarct ischemia and normal LVEF.  He reports stable angina symptoms based on discussion today and remains comfortable with observation on medical therapy.  I did talk with him about the possibility of pursuing further invasive testing if symptoms escalate.  Continue aspirin, Norvasc, and Lipitor.  Prescription provided for as needed nitroglycerin.  He is not on beta-blocker with previous symptomatic bradycardia based on chart review.  2.  Mixed hyperlipidemia, on Lipitor.  Requesting interval lab work for Dr. Nevada Crane.  3.  History of frequent PVCs, reports no active palpitations or syncope.  LVEF 60 to 65% range by  echocardiogram in December 2020.  4.  CKD stage IIIb, creatinine 1.65 as of February.  He does have some increased risk for contrast nephropathy with angiographic studies.  Medication Adjustments/Labs and Tests Ordered: Current medicines are reviewed at length with the patient today.  Concerns regarding medicines are outlined above.   Tests Ordered: No orders of the defined types were placed in this encounter.   Medication Changes: Meds ordered this encounter  Medications  . nitroGLYCERIN (NITROSTAT) 0.4 MG SL tablet    Sig: Place 1 tablet (0.4 mg total) under the tongue every 5 (five) minutes as needed for chest pain.    Dispense:  25 tablet    Refill:  3    New 09/09/2019    Disposition:  Follow up 6 months in the [place] office.  Signed, Satira Sark, MD, [facility] 09/09/2019 12:10 PM    [facility] [place], [place], [place] [address] Phone: [phone]; Fax: [phone]

## 2019-09-09 NOTE — Patient Instructions (Signed)
Medication Instructions:   Begin Nitroglycerin as needed for chest pain.   Continue all other medications.    Labwork: none  Testing/Procedures: none  Follow-Up: 6 months   Any Other Special Instructions Will Be Listed Below (If Applicable).  If you need a refill on your cardiac medications before your next appointment, please call your pharmacy.

## 2019-10-04 ENCOUNTER — Ambulatory Visit: Payer: PPO | Admitting: Cardiovascular Disease

## 2019-10-04 ENCOUNTER — Other Ambulatory Visit: Payer: Self-pay

## 2019-10-04 MED ORDER — AMLODIPINE BESYLATE 10 MG PO TABS: 10.0000 mg | ORAL_TABLET | Freq: Every day | ORAL | 3 refills | Status: DC

## 2019-10-04 NOTE — Telephone Encounter (Signed)
Refilled amlodipine to [facility]  [facility]

## 2019-10-30 DIAGNOSIS — K5733 Diverticulitis of large intestine without perforation or abscess with bleeding: Secondary | ICD-10-CM | POA: Diagnosis not present

## 2019-11-06 DIAGNOSIS — Z23 Encounter for immunization: Secondary | ICD-10-CM | POA: Diagnosis not present

## 2019-11-12 DIAGNOSIS — R7303 Prediabetes: Secondary | ICD-10-CM | POA: Diagnosis not present

## 2019-11-12 DIAGNOSIS — M25552 Pain in left hip: Secondary | ICD-10-CM | POA: Diagnosis not present

## 2019-11-12 DIAGNOSIS — M25551 Pain in right hip: Secondary | ICD-10-CM | POA: Diagnosis not present

## 2019-11-12 DIAGNOSIS — I10 Essential (primary) hypertension: Secondary | ICD-10-CM | POA: Diagnosis not present

## 2019-11-12 DIAGNOSIS — W57XXXA Bitten or stung by nonvenomous insect and other nonvenomous arthropods, initial encounter: Secondary | ICD-10-CM | POA: Diagnosis not present

## 2019-11-12 DIAGNOSIS — I129 Hypertensive chronic kidney disease with stage 1 through stage 4 chronic kidney disease, or unspecified chronic kidney disease: Secondary | ICD-10-CM | POA: Diagnosis not present

## 2019-11-12 DIAGNOSIS — M1712 Unilateral primary osteoarthritis, left knee: Secondary | ICD-10-CM | POA: Diagnosis not present

## 2019-11-12 DIAGNOSIS — Z8546 Personal history of malignant neoplasm of prostate: Secondary | ICD-10-CM | POA: Diagnosis not present

## 2019-11-12 DIAGNOSIS — E782 Mixed hyperlipidemia: Secondary | ICD-10-CM | POA: Diagnosis not present

## 2019-11-12 DIAGNOSIS — N189 Chronic kidney disease, unspecified: Secondary | ICD-10-CM | POA: Diagnosis not present

## 2019-11-12 DIAGNOSIS — R7301 Impaired fasting glucose: Secondary | ICD-10-CM | POA: Diagnosis not present

## 2019-11-12 DIAGNOSIS — I2510 Atherosclerotic heart disease of native coronary artery without angina pectoris: Secondary | ICD-10-CM | POA: Diagnosis not present

## 2019-12-06 ENCOUNTER — Encounter: Payer: Self-pay | Admitting: Urology

## 2019-12-06 ENCOUNTER — Ambulatory Visit (INDEPENDENT_AMBULATORY_CARE_PROVIDER_SITE_OTHER): Payer: PPO | Admitting: Urology

## 2019-12-06 ENCOUNTER — Other Ambulatory Visit: Payer: Self-pay

## 2019-12-06 VITALS — BP 144/71 | HR 74 | Temp 98.5°F | Ht 75.0 in | Wt 255.0 lb

## 2019-12-06 DIAGNOSIS — N3281 Overactive bladder: Secondary | ICD-10-CM

## 2019-12-06 DIAGNOSIS — R3129 Other microscopic hematuria: Secondary | ICD-10-CM

## 2019-12-06 DIAGNOSIS — Z8546 Personal history of malignant neoplasm of prostate: Secondary | ICD-10-CM

## 2019-12-06 DIAGNOSIS — R1030 Lower abdominal pain, unspecified: Secondary | ICD-10-CM

## 2019-12-06 DIAGNOSIS — N3941 Urge incontinence: Secondary | ICD-10-CM

## 2019-12-06 LAB — URINALYSIS, ROUTINE W REFLEX MICROSCOPIC
Bilirubin, UA: NEGATIVE
Glucose, UA: NEGATIVE
Ketones, UA: NEGATIVE
Leukocytes,UA: NEGATIVE
Nitrite, UA: NEGATIVE
Protein,UA: NEGATIVE
RBC, UA: NEGATIVE
Specific Gravity, UA: 1.025 (ref 1.005–1.030)
Urobilinogen, Ur: 2 mg/dL — ABNORMAL HIGH (ref 0.2–1.0)
pH, UA: 5.5 (ref 5.0–7.5)

## 2019-12-06 LAB — BLADDER SCAN: Scan Result: 28

## 2019-12-06 MED ORDER — GEMTESA 75 MG PO TABS: 75.0000 mg | ORAL_TABLET | Freq: Every day | ORAL | 0 refills | Status: DC

## 2019-12-06 NOTE — Progress Notes (Signed)
Scan 28scanBladder Scan Patient :  28     ml Performed By: Jorge Ny

## 2019-12-06 NOTE — Progress Notes (Signed)
Urological Symptom Review  Patient is experiencing the following symptoms: Get up at night to urinate Leakage of urine Stream starts and stops   Review of Systems  Gastrointestinal (upper)  : Negative for upper GI symptoms  Gastrointestinal (lower) : Negative for lower GI symptoms  Constitutional : Negative for symptoms  Skin: Negative for skin symptoms  Eyes: Negative for eye symptoms  Ear/Nose/Throat : Negative for Ear/Nose/Throat symptoms  Hematologic/Lymphatic: Easy bruising  Cardiovascular : Negative for cardiovascular symptoms  Respiratory : Negative for respiratory symptoms  Endocrine: Negative for endocrine symptoms  Musculoskeletal: Joint pain  Neurological: Negative for neurological symptoms  Psychologic: Negative for psychiatric symptoms

## 2019-12-06 NOTE — Progress Notes (Signed)
Subjective:  1. Personal history of malignant neoplasm of prostate   2. Lower abdominal pain   3. Urge incontinence   4. Overactive bladder   5. Microhematuria     [name] returns today in f/u.  He has a history of prostate cancer diagnosed in 1999 and treated with seeds in 2000.  His last PSA was <0.1 in 9/20.  He has a 2 month history of some dull suprapubic pain.  He will have rare sharp exacerbations with radiation down to the tip of the penis.   He has chronic frequency and nocturia.  He is having SUI and uses paper towel.  He has urgency with UUI that has progressed.  His IPSS is 24.  He had previously been on Oxybutynin.  He had UDS in the past that showed a 275ml bladder with some instability.      ROS:  ROS:  A complete review of systems was performed.  All systems are negative except for pertinent findings as noted.   ROS  No Known Allergies  Outpatient Encounter Medications as of 12/06/2019  Medication Sig  . amLODipine (NORVASC) 10 MG tablet Take 1 tablet (10 mg total) by mouth daily.  Marland Kitchen aspirin EC 81 MG tablet Take 1 tablet (81 mg total) by mouth 2 (two) times daily. (Patient taking differently: Take 81 mg by mouth daily. )  . atorvastatin (LIPITOR) 10 MG tablet Take 10 mg by mouth daily.  . nitroGLYCERIN (NITROSTAT) 0.4 MG SL tablet Place 1 tablet (0.4 mg total) under the tongue every 5 (five) minutes as needed for chest pain.  . Vibegron (GEMTESA) 75 MG TABS Take 75 mg by mouth daily.  . [DISCONTINUED] oxyCODONE-acetaminophen (PERCOCET/ROXICET) 5-325 MG tablet Take 1 tablet by mouth every 4 (four) hours as needed for severe pain. (Patient not taking: Reported on 12/06/2019)  . [DISCONTINUED] tiZANidine (ZANAFLEX) 2 MG tablet Take 1 tablet (2 mg total) by mouth every 6 (six) hours as needed. (Patient not taking: Reported on 12/06/2019)   No facility-administered encounter medications on file as of 12/06/2019.    Past Medical History:  Diagnosis Date  . Arthritis    . Chronic back pain   . CKD (chronic kidney disease) stage 3, GFR 30-59 ml/min (HCC)   . Essential hypertension   . GERD (gastroesophageal reflux disease)   . Helicobacter pylori gastritis    Treated  . Hx of adenomatous colonic polyps   . IBS (irritable bowel syndrome)   . Prostate CA ([place])    Seed implants  . Renal insufficiency   . Schatzki's ring     Past Surgical History:  Procedure Laterality Date  . APPENDECTOMY  age 3  . BACK SURGERY  02/08   [place]  . BACK SURGERY  09/24/04   [facility], [facility]  . BACK SURGERY  09/18/02   [place]  . BILIARY STENT PLACEMENT  01/07/2012   Procedure: BILIARY STENT PLACEMENT;  Surgeon: Rogene Houston, MD;  Location: [facility];  Service: Endoscopy;  Laterality: N/A;  . BILIARY STENT PLACEMENT  02/21/2012   Procedure: BILIARY STENT PLACEMENT;  Surgeon: Rogene Houston, MD;  Location: [facility];  Service: Endoscopy;  Laterality: N/A;  Biliary Stent Replacement  . CATARACT EXTRACTION W/PHACO  11/22/2010   Procedure: CATARACT EXTRACTION PHACO AND INTRAOCULAR LENS PLACEMENT (IOC);  Surgeon: Williams Che;  Location: [facility];  Service: Ophthalmology;  Laterality: Right;  CDE: 6.81  . CHOLECYSTECTOMY  03/04/05   [facility], [place]. Gangrenous cholecystitis complicated by abscess requiring  percutaneous drainage. He also had common duct stones requiring ERCP and sphincterotomy.  . COLONOSCOPY  August 2005   Scattered sigmoid diverticulosis, splenic flexure polyp. Hyperplastic  . COLONOSCOPY  1997   3 cm tubular adenoma and a sending colon  . COLONOSCOPY  02/01/2011   Rourk-friable anal canal, hyperplastic rectal polyp  . COLONOSCOPY N/A 08/15/2013   Procedure: COLONOSCOPY;  Surgeon: Rogene Houston, MD;  [facility];  Service: Endoscopy;  Laterality: N/A;  100  . COLONOSCOPY N/A 09/20/2018   Procedure: COLONOSCOPY;  Surgeon: Rogene Houston, MD;  [facility];  Service: Endoscopy;  Laterality: N/A;  12:00pm  . ERCP  03/02/05   [facility].  Normal-appearing biliary tree (gallbladder not image), status post sphincterotomy with recovery of small pieces of stone material, status post balloon occlusion cholangiogram.  . ERCP  01/07/2012   Procedure: ENDOSCOPIC RETROGRADE CHOLANGIOPANCREATOGRAPHY (ERCP);  Surgeon: Rogene Houston, MD;  Location: [facility];  Service: Endoscopy;  Laterality: N/A;  possible biliary stenting  . ERCP  02/21/2012   Procedure: ENDOSCOPIC RETROGRADE CHOLANGIOPANCREATOGRAPHY (ERCP);  Surgeon: Rogene Houston, MD;  Location: [facility];  Service: Endoscopy;  Laterality: N/A;  common bile duct stone and clip removed  . ERCP N/A 05/18/2012   Procedure: ENDOSCOPIC RETROGRADE CHOLANGIOPANCREATOGRAPHY (ERCP);  Surgeon: Rogene Houston, MD;  Location: [facility];  Service: Endoscopy;  Laterality: N/A;  stone and debri removal  . ESOPHAGOGASTRODUODENOSCOPY  August 2005   Erosive esophagitis and Schatzki ring, small hiatal hernia  . ESOPHAGOGASTRODUODENOSCOPY  01/2008   Mild reflux esophagitis, small hiatal hernia  . ESOPHAGOGASTRODUODENOSCOPY  02/01/2011   Rourk-erosive reflux esophagitis, small hiatal hernia  . ESOPHAGOGASTRODUODENOSCOPY  10/26/2011   Dr. Eli Phillips gastric submucosal petechia (bx-benign ulceration), juxta ampullary duodenal diverticulum and some mucosal edema involving 1st/2nd portion of duodenum with superficial erosions (bx-superficial ulceration/benign)  . ESOPHAGOGASTRODUODENOSCOPY  02/21/2012   Procedure: ESOPHAGOGASTRODUODENOSCOPY (EGD);  Surgeon: Rogene Houston, MD;  Location: [facility];  Service: Endoscopy;  Laterality: N/A;  . EYE SURGERY    . Incomplete colonoscopy  December 2009   Left-sided diverticula, mid descending colon polyp, due to recurrent looping and redundancy exam was incomplete. It was felt that the mid colon was reached. Followup barium enema showed colon interposition between the liver and the diaphragm, redundant sigmoid colon but no colon mass or polyp identified.. Pathology revealed  tubular adenoma.  Marland Kitchen POLYPECTOMY  09/20/2018   Procedure: POLYPECTOMY;  Surgeon: Rogene Houston, MD;  [facility];  Service: Endoscopy;;  Hepatic flexure polyp cold snare, splenic flexure polyp  . SPHINCTEROTOMY  01/07/2012   Procedure: SPHINCTEROTOMY;  Surgeon: Rogene Houston, MD;  Location: [facility];  Service: Endoscopy;  Laterality: N/A;  Extended  . SPYGLASS CHOLANGIOSCOPY  02/21/2012   Procedure: SPYGLASS CHOLANGIOSCOPY;  Surgeon: Rogene Houston, MD;  Location: [facility];  Service: Endoscopy;  Laterality: N/A;  . SPYGLASS CHOLANGIOSCOPY N/A 05/18/2012   Procedure: SPYGLASS CHOLANGIOSCOPY;  Surgeon: Rogene Houston, MD;  Location: [facility];  Service: Endoscopy;  Laterality: N/A;  . TOTAL KNEE ARTHROPLASTY  03/21/2012   Procedure: TOTAL KNEE ARTHROPLASTY;  Surgeon: [name], MD;  Location: [place];  Service: Orthopedics;  Laterality: Right;  right knee arthroplasty  . TOTAL KNEE ARTHROPLASTY Left 04/08/2019   Procedure: LEFT TOTAL KNEE ARTHROPLASTY;  Surgeon: Frederik Pear, MD;  Location: [facility];  Service: Orthopedics;  Laterality: Left;    Social History   Socioeconomic History  . Marital status: Married  Spouse name: Not on file  . Number of children: 2  . Years of education: Not on file  . Highest education level: Not on file  Occupational History  . Occupation: Retired Chemical engineer: RETIRED  Tobacco Use  . Smoking status: Never Smoker  . Smokeless tobacco: Never Used  Vaping Use  . Vaping Use: Never used  Substance and Sexual Activity  . Alcohol use: No    Alcohol/week: 0.0 standard drinks  . Drug use: No  . Sexual activity: Not on file  Other Topics Concern  . Not on file  Social History Narrative  . Not on file   Social Determinants of Health   Financial Resource Strain:   . Difficulty of Paying Living Expenses: Not on file  Food Insecurity:   . Worried About Charity fundraiser in the Last Year: Not on file  . Ran Out of Food in the Last  Year: Not on file  Transportation Needs:   . Lack of Transportation (Medical): Not on file  . Lack of Transportation (Non-Medical): Not on file  Physical Activity:   . Days of Exercise per Week: Not on file  . Minutes of Exercise per Session: Not on file  Stress:   . Feeling of Stress : Not on file  Social Connections:   . Frequency of Communication with Friends and Family: Not on file  . Frequency of Social Gatherings with Friends and Family: Not on file  . Attends Religious Services: Not on file  . Active Member of Clubs or Organizations: Not on file  . Attends Archivist Meetings: Not on file  . Marital Status: Not on file  Intimate Partner Violence:   . Fear of Current or Ex-Partner: Not on file  . Emotionally Abused: Not on file  . Physically Abused: Not on file  . Sexually Abused: Not on file    Family History  Problem Relation Age of Onset  . Diabetes Mother   . Coronary artery disease Mother   . Coronary artery disease Father   . Diabetes Father   . Stomach cancer Father   . Deep vein thrombosis Daughter   . Hypotension Neg Hx   . Anesthesia problems Neg Hx   . Malignant hyperthermia Neg Hx   . Pseudochol deficiency Neg Hx   . Colon cancer Neg Hx        Objective: Vitals:   12/06/19 1404  BP: (!) 144/71  Pulse: 74  Temp: 98.5 F (36.9 C)     Physical Exam Constitutional:      Appearance: Normal appearance. He is obese.  Abdominal:     Palpations: Abdomen is soft.     Tenderness: There is abdominal tenderness (Mild suprapubic).     Hernia: No hernia is present.  Genitourinary:    Comments: Normal phallus with adequate meatus. Scrotum, testes and epididymis normal. AP without lesions. NST without mass. Prostate 1.5+ firm, consistent with prior seeds. SV's non-palpable.  Musculoskeletal:        General: No swelling or tenderness. Normal range of motion.  Skin:    General: Skin is warm and dry.  Neurological:     General: No focal  deficit present.     Mental Status: He is alert and oriented to person, place, and time.     Lab Results:  Results for orders placed or performed in visit on 12/06/19 (from the past 24 hour(s))  Urinalysis, Routine w reflex microscopic  Status: Abnormal   Collection Time: 12/06/19  2:08 PM  Result Value Ref Range   Specific Gravity, UA 1.025 1.005 - 1.030   pH, UA 5.5 5.0 - 7.5   Color, UA Yellow Yellow   Appearance Ur Clear Clear   Leukocytes,UA Negative Negative   Protein,UA Negative Negative/Trace   Glucose, UA Negative Negative   Ketones, UA Negative Negative   RBC, UA Negative Negative   Bilirubin, UA Negative Negative   Urobilinogen, Ur 2.0 (H) 0.2 - 1.0 mg/dL   Nitrite, UA Negative Negative   Microscopic Examination Comment    Narrative   Performed at:  [facility] [address] [place], [place]  [id number] Lab Director: Mina Marble MT, Phone:  [phone]  PSA     Status: None   Collection Time: 12/06/19  2:46 PM  Result Value Ref Range   Prostate Specific Ag, Serum <0.1 0.0 - 4.0 ng/mL   Narrative   Performed at:  [address] [place], [place]  [id number] Lab Director: Rush Farmer MD, Phone:  [phone]    BMET No results for input(s): NA, K, CL, CO2, GLUCOSE, BUN, CREATININE, CALCIUM in the last 72 hours. PSA <0.1 in 9/20.     Studies/Results:  He had a virtual colonoscopy in 9/20 that was unremarkable.    Assessment & Plan: History of prostate cancer.   I will get a PSA today and in 1 year.  Lower abdominal pain.   I will get a CT AP with oral contrast.  OAB with urge incontinence.   I will give him samples of Gemtesa to try.    Meds ordered this encounter  Medications  . Vibegron (GEMTESA) 75 MG TABS    Sig: Take 75 mg by mouth daily.    Dispense:  14 tablet    Refill:  0    Samples given     Orders Placed This Encounter  Procedures  . CT ABDOMEN PELVIS WO CONTRAST    Standing Status:    Future    Standing Expiration Date:   01/06/2020    Order Specific Question:   Preferred imaging location?    Answer:   [facility]    Order Specific Question:   Is Oral Contrast requested for this exam?    Answer:   Yes, Per Radiology protocol  . Urinalysis, Routine w reflex microscopic  . PSA  . PSA    Standing Status:   Future    Standing Expiration Date:   12/05/2020  . Bladder scan      Return in about 6 weeks (around 01/17/2020).   CC: Celene Squibb, MD      Irine Seal 12/07/2019

## 2019-12-07 LAB — PSA: Prostate Specific Ag, Serum: <0.1 ng/mL (ref 0.0–4.0)

## 2019-12-10 ENCOUNTER — Telehealth: Payer: Self-pay

## 2019-12-10 NOTE — Telephone Encounter (Signed)
Pt called and made aware

## 2019-12-10 NOTE — Telephone Encounter (Signed)
-----   Message from Dorisann Frames, RN sent at 12/10/2019 11:08 AM EDT -----  ----- Message ----- From: Irine Seal, MD Sent: 12/09/2019   8:15 PM EDT To: [facility]  PSa remains undetectible.

## 2019-12-19 ENCOUNTER — Ambulatory Visit (INDEPENDENT_AMBULATORY_CARE_PROVIDER_SITE_OTHER): Payer: PPO | Admitting: Gastroenterology

## 2019-12-24 DIAGNOSIS — M25562 Pain in left knee: Secondary | ICD-10-CM | POA: Diagnosis not present

## 2019-12-24 DIAGNOSIS — Z471 Aftercare following joint replacement surgery: Secondary | ICD-10-CM | POA: Diagnosis not present

## 2019-12-24 DIAGNOSIS — Z96651 Presence of right artificial knee joint: Secondary | ICD-10-CM | POA: Diagnosis not present

## 2019-12-24 DIAGNOSIS — Z96652 Presence of left artificial knee joint: Secondary | ICD-10-CM | POA: Diagnosis not present

## 2019-12-25 ENCOUNTER — Ambulatory Visit (INDEPENDENT_AMBULATORY_CARE_PROVIDER_SITE_OTHER): Payer: PPO | Admitting: Gastroenterology

## 2019-12-25 ENCOUNTER — Encounter (INDEPENDENT_AMBULATORY_CARE_PROVIDER_SITE_OTHER): Payer: Self-pay | Admitting: Gastroenterology

## 2019-12-25 ENCOUNTER — Other Ambulatory Visit: Payer: Self-pay

## 2019-12-25 DIAGNOSIS — R109 Unspecified abdominal pain: Secondary | ICD-10-CM | POA: Insufficient documentation

## 2019-12-25 DIAGNOSIS — R1030 Lower abdominal pain, unspecified: Secondary | ICD-10-CM | POA: Diagnosis not present

## 2019-12-25 DIAGNOSIS — R140 Abdominal distension (gaseous): Secondary | ICD-10-CM | POA: Insufficient documentation

## 2019-12-25 NOTE — Progress Notes (Signed)
Maylon Peppers, M.D. Gastroenterology & Hepatology [facility] [address] [place], [place] [address]  Primary Care Physician: Celene Squibb, MD [place] [id number]  I will communicate my assessment and recommendations to the referring MD via EMR. "Note: Occasional unusual wording and randomly placed punctuation marks may result from the use of speech recognition technology to transcribe this document"  Problems: 1. Functional abdominal pain 2. IBS  History of Present Illness: [name] is a 79 y.o. male with PMH prostate cancer s/p radiation seeds, CKD, hypertension, GERD, IBS, Schatzki's ring, who presents for follow up of abdominal discomfort.  The patient was last seen on 04/30/2018. At that time, the patient was presenting constipation and abdominal pain.  He was given samples of Amitiza and was ordered to stop taking MiraLAX.  He did not follow-up in the clinic since then.  The patient reports that he has persisted with the lower abdominal discomfort, which is worse in the suprapubic area.  He denies having any pain but states that he feels constantly bloated and with pressure in the hypogastric area.  He used to be constipated but currently he takes Colace and Miralax daily, states the discomfort does not get better if he has a BM. He reports he has a BM every 1-3 days.  Denies any melena or hematochezia.  For his bloating, the patient reports he has taken Gasex but it has not helped him. Has not tried any kind of diet or other medications.  The patient denies having any nausea, vomiting, fever, chills, hematochezia, melena, hematemesis, diarrhea, jaundice, pruritus or weight loss.  Most recent CT of the abdomen and pelvis without IV contrast was performed on 04/09/2018 which showed moderate amount of stool throughout the colon without presence of acute abnormalities in his abdomen.  09/11/2013 Virtual  colonoscopy: Incomplete colonoscopy.  IMPRESSION: 1. This virtual colonoscopy is somewhat suboptimal due to lack of adequate distension. However no clinically significant polypoid lesion is seen and no constricting lesion is noted. 2. Very elongated and tortuous colon. 3. Lipomatosis ileocecal valve.   08/15/2013 Colonoscopy: surveillance: hx adenomas. Impression:  Examination performed to ascending colon but cecum could not be reached. Position confirmed by applying single resolution clip to mucosa of ascending colon. Scattered diverticuli involving sigmoid and descending colon. No evidence of recurrent polyps.   Past Medical History: Past Medical History:  Diagnosis Date  . Arthritis   . Chronic back pain   . CKD (chronic kidney disease) stage 3, GFR 30-59 ml/min (HCC)   . Essential hypertension   . GERD (gastroesophageal reflux disease)   . Helicobacter pylori gastritis    Treated  . Hx of adenomatous colonic polyps   . IBS (irritable bowel syndrome)   . Prostate CA ([place])    Seed implants  . Renal insufficiency   . Schatzki's ring     Past Surgical History: Past Surgical History:  Procedure Laterality Date  . APPENDECTOMY  age 54  . BACK SURGERY  02/08   [place]  . BACK SURGERY  09/24/04   lumbar, [facility]  . BACK SURGERY  09/18/02   [place]  . BILIARY STENT PLACEMENT  01/07/2012   Procedure: BILIARY STENT PLACEMENT;  Surgeon: Rogene Houston, MD;  [facility];  Service: Endoscopy;  Laterality: N/A;  . BILIARY STENT PLACEMENT  02/21/2012   Procedure: BILIARY STENT PLACEMENT;  Surgeon: Rogene Houston, MD;  [facility];  Service: Endoscopy;  Laterality: N/A;  Biliary Stent Replacement  . CATARACT EXTRACTION W/PHACO  11/22/2010   Procedure: CATARACT EXTRACTION PHACO AND INTRAOCULAR LENS PLACEMENT (IOC);  Surgeon: Williams Che;  [facility]: [facility];  Service: Ophthalmology;  Laterality: Right;  CDE: 6.81  . CHOLECYSTECTOMY  03/04/05   [facility], [facility]. Gangrenous  cholecystitis complicated by abscess requiring percutaneous drainage. He also had common duct stones requiring ERCP and sphincterotomy.  . COLONOSCOPY  August 2005   Scattered sigmoid diverticulosis, splenic flexure polyp. Hyperplastic  . COLONOSCOPY  1997   3 cm tubular adenoma and a sending colon  . COLONOSCOPY  02/01/2011   Rourk-friable anal canal, hyperplastic rectal polyp  . COLONOSCOPY N/A 08/15/2013   Procedure: COLONOSCOPY;  Surgeon: Rogene Houston, MD;  [facility];  Service: Endoscopy;  Laterality: N/A;  100  . COLONOSCOPY N/A 09/20/2018   Procedure: COLONOSCOPY;  Surgeon: Rogene Houston, MD;  [facility];  Service: Endoscopy;  Laterality: N/A;  12:00pm  . ERCP  03/02/05   [facility], Rourk. Normal-appearing biliary tree (gallbladder not image), status post sphincterotomy with recovery of small pieces of stone material, status post balloon occlusion cholangiogram.  . ERCP  01/07/2012   Procedure: ENDOSCOPIC RETROGRADE CHOLANGIOPANCREATOGRAPHY (ERCP);  Surgeon: Rogene Houston, MD;  [facility];  Service: Endoscopy;  Laterality: N/A;  possible biliary stenting  . ERCP  02/21/2012   Procedure: ENDOSCOPIC RETROGRADE CHOLANGIOPANCREATOGRAPHY (ERCP);  Surgeon: Rogene Houston, MD;  [facility];  Service: Endoscopy;  Laterality: N/A;  common bile duct stone and clip removed  . ERCP N/A 05/18/2012   Procedure: ENDOSCOPIC RETROGRADE CHOLANGIOPANCREATOGRAPHY (ERCP);  Surgeon: Rogene Houston, MD;  [facility];  Service: Endoscopy;  Laterality: N/A;  stone and debri removal  . ESOPHAGOGASTRODUODENOSCOPY  August 2005   Erosive esophagitis and Schatzki ring, small hiatal hernia  . ESOPHAGOGASTRODUODENOSCOPY  01/2008   Mild reflux esophagitis, small hiatal hernia  . ESOPHAGOGASTRODUODENOSCOPY  02/01/2011   Rourk-erosive reflux esophagitis, small hiatal hernia  . ESOPHAGOGASTRODUODENOSCOPY  10/26/2011   Dr. Eli Phillips gastric submucosal petechia (bx-benign  ulceration), juxta ampullary duodenal diverticulum and some mucosal edema involving 1st/2nd portion of duodenum with superficial erosions (bx-superficial ulceration/benign)  . ESOPHAGOGASTRODUODENOSCOPY  02/21/2012   Procedure: ESOPHAGOGASTRODUODENOSCOPY (EGD);  Surgeon: Rogene Houston, MD;  [facility];  Service: Endoscopy;  Laterality: N/A;  . EYE SURGERY    . Incomplete colonoscopy  December 2009   Left-sided diverticula, mid descending colon polyp, due to recurrent looping and redundancy exam was incomplete. It was felt that the mid colon was reached. Followup barium enema showed colon interposition between the liver and the diaphragm, redundant sigmoid colon but no colon mass or polyp identified.. Pathology revealed tubular adenoma.  Marland Kitchen POLYPECTOMY  09/20/2018   Procedure: POLYPECTOMY;  Surgeon: Rogene Houston, MD;  [facility];  Service: Endoscopy;;  Hepatic flexure polyp cold snare, splenic flexure polyp  . SPHINCTEROTOMY  01/07/2012   Procedure: SPHINCTEROTOMY;  Surgeon: Rogene Houston, MD;  [facility];  Service: Endoscopy;  Laterality: N/A;  Extended  . SPYGLASS CHOLANGIOSCOPY  02/21/2012   Procedure: SPYGLASS CHOLANGIOSCOPY;  Surgeon: Rogene Houston, MD;  [facility];  Service: Endoscopy;  Laterality: N/A;  . SPYGLASS CHOLANGIOSCOPY N/A 05/18/2012   Procedure: SPYGLASS CHOLANGIOSCOPY;  Surgeon: Rogene Houston, MD;  [facility];  Service: Endoscopy;  Laterality: N/A;  . TOTAL KNEE ARTHROPLASTY  03/21/2012   Procedure: TOTAL KNEE ARTHROPLASTY;  Surgeon: [name], MD;  Location: [place];  Service: Orthopedics;  Laterality: Right;  right knee arthroplasty  . TOTAL KNEE ARTHROPLASTY Left 04/08/2019   Procedure: LEFT TOTAL KNEE ARTHROPLASTY;  Surgeon: Frederik Pear, MD;  [facility];  Service: Orthopedics;  Laterality: Left;    Family History: Family History  Problem Relation Age of Onset  . Diabetes Mother   . Coronary artery disease Mother   .  Coronary artery disease Father   . Diabetes Father   . Stomach cancer Father   . Deep vein thrombosis Daughter   . Hypotension Neg Hx   . Anesthesia problems Neg Hx   . Malignant hyperthermia Neg Hx   . Pseudochol deficiency Neg Hx   . Colon cancer Neg Hx     Social History: Social History   Tobacco Use  Smoking Status Never Smoker  Smokeless Tobacco Never Used   Social History   Substance and Sexual Activity  Alcohol Use No  . Alcohol/week: 0.0 standard drinks   Social History   Substance and Sexual Activity  Drug Use No    Allergies: No Known Allergies  Medications: Current Outpatient Medications  Medication Sig Dispense Refill  . amLODipine (NORVASC) 10 MG tablet Take 1 tablet (10 mg total) by mouth daily. 90 tablet 3  . aspirin EC 81 MG tablet Take 1 tablet (81 mg total) by mouth 2 (two) times daily. (Patient taking differently: Take 81 mg by mouth daily. ) 60 tablet 0  . atorvastatin (LIPITOR) 10 MG tablet Take 10 mg by mouth daily.    . nitroGLYCERIN (NITROSTAT) 0.4 MG SL tablet Place 1 tablet (0.4 mg total) under the tongue every 5 (five) minutes as needed for chest pain. 25 tablet 3  . Vibegron (GEMTESA) 75 MG TABS Take 75 mg by mouth daily. (Patient not taking: Reported on 12/25/2019) 14 tablet 0   No current facility-administered medications for this visit.    Review of Systems: GENERAL: negative for malaise, night sweats HEENT: No changes in hearing or vision, no nose bleeds or other nasal problems. NECK: Negative for lumps, goiter, pain and significant neck swelling RESPIRATORY: Negative for cough, wheezing CARDIOVASCULAR: Negative for chest pain, leg swelling, palpitations, orthopnea GI: SEE HPI MUSCULOSKELETAL: Negative for joint pain or swelling, back pain, and muscle pain. SKIN: Negative for lesions, rash PSYCH: Negative for sleep disturbance, mood disorder and recent psychosocial stressors. HEMATOLOGY Negative for prolonged bleeding, bruising  easily, and swollen nodes. ENDOCRINE: Negative for cold or heat intolerance, polyuria, polydipsia and goiter. NEURO: negative for tremor, gait imbalance, syncope and seizures. The remainder of the review of systems is noncontributory.   Physical Exam: BP (!) 162/69 (BP Location: Right Arm, Patient Position: Sitting, Cuff Size: Normal)   Pulse 61   Temp 98.2 F (36.8 C) (Oral)   Ht 6\' 3"  (1.905 m)   Wt 256 lb (116.1 kg)   BMI 32.00 kg/m  GENERAL: The patient is AO x3, in no acute distress. HEENT: Head is normocephalic and atraumatic. EOMI are intact. Mouth is well hydrated and without lesions. NECK: Supple. No masses LUNGS: Clear to auscultation. No presence of rhonchi/wheezing/rales. Adequate chest expansion HEART: RRR, normal s1 and s2. ABDOMEN: Mild discomfort upon palpation of the hypogastric area, no guarding, no peritoneal signs, and nondistended. BS +. No masses. EXTREMITIES: Without any cyanosis, clubbing, rash, lesions or edema. NEUROLOGIC: AOx3, no focal motor deficit. SKIN: no jaundice, no rashes  Imaging/Labs: as above  I personally reviewed and interpreted the available labs, imaging and endoscopic files.  Impression and Plan: [name] is a 79 y.o. male with PMH prostate cancer s/p radiation seeds, CKD, hypertension, GERD, IBS, Schatzki's ring, who presents for follow up of abdominal discomfort.  The patient has presented persistent discomfort in his lower abdomen which is chronic in nature.  He has had previous imaging which has been negative for any source to explain his bloating and discomfort.  He could not complete a colonoscopy in the past but had a negative CT colonography in 2020.  He has not presented any red flag signs.  His symptoms are likely related to functional etiology- IBS (possibly worsened by previous local radiation in the periprostatic area), for which the use of IBgard as needed and implementation of a low FODMAP diet may improve his symptomatology.   The patient understood and agreed.  - Explained presumed etiology of IBS symptoms. Patient was counseled about the benefit of implementing a low FODMAP to improve symptoms and recurrent episodes. A dietary list was provided to the patient. Also, the patient was counseled about the benefit of avoiding stressing situations and potential environmental triggers leading to symptomatology. - Start IBGard 1 tablet every 8-12 hours as needed  All questions were answered.      Harvel Quale, MD Gastroenterology and Hepatology [facility]

## 2019-12-25 NOTE — Patient Instructions (Signed)
-   Explained presumed etiology of IBS symptoms. Patient was counseled about the benefit of implementing a low FODMAP to improve symptoms and recurrent episodes. A dietary list was provided to the patient. Also, the patient was counseled about the benefit of avoiding stressing situations and potential environmental triggers leading to symptomatology. - Start IBGard 1 tablet every 8-12 hours as needed

## 2020-01-01 ENCOUNTER — Other Ambulatory Visit: Payer: Self-pay

## 2020-01-01 ENCOUNTER — Ambulatory Visit (HOSPITAL_COMMUNITY)
Admission: RE | Admit: 2020-01-01 | Discharge: 2020-01-01 | Disposition: A | Discharge status: Home / Self Care | Payer: PPO | Source: Ambulatory Visit | Attending: Urology | Admitting: Urology

## 2020-01-01 DIAGNOSIS — I700 Atherosclerosis of aorta: Secondary | ICD-10-CM | POA: Diagnosis not present

## 2020-01-01 DIAGNOSIS — R590 Localized enlarged lymph nodes: Secondary | ICD-10-CM | POA: Diagnosis not present

## 2020-01-01 DIAGNOSIS — R102 Pelvic and perineal pain: Secondary | ICD-10-CM | POA: Diagnosis not present

## 2020-01-01 DIAGNOSIS — R1030 Lower abdominal pain, unspecified: Secondary | ICD-10-CM | POA: Insufficient documentation

## 2020-01-01 DIAGNOSIS — Q438 Other specified congenital malformations of intestine: Secondary | ICD-10-CM | POA: Diagnosis not present

## 2020-01-02 ENCOUNTER — Telehealth: Payer: Self-pay

## 2020-01-02 ENCOUNTER — Other Ambulatory Visit: Payer: Self-pay | Admitting: Urology

## 2020-01-02 DIAGNOSIS — R590 Localized enlarged lymph nodes: Secondary | ICD-10-CM

## 2020-01-02 NOTE — Telephone Encounter (Signed)
Pt notified of Ct results and pet scan to be scheduled.

## 2020-01-02 NOTE — Progress Notes (Signed)
The CT shows some lymph node enlargement in the left external and internal iliac areas.  The radiologist suggests that it is consistent with metastatic disease but his PSA was <0.1 so it is unlikely to be secondary to prostate cancer.    There is no obvious cause of the abdominal discomfort noted.   He is going to need a standard FDG PET scan to assess the nodes to see if they are metabolically active.    I will place the order.

## 2020-01-06 ENCOUNTER — Other Ambulatory Visit: Payer: Self-pay

## 2020-01-06 DIAGNOSIS — R590 Localized enlarged lymph nodes: Secondary | ICD-10-CM

## 2020-01-10 DIAGNOSIS — R944 Abnormal results of kidney function studies: Secondary | ICD-10-CM | POA: Diagnosis not present

## 2020-01-10 DIAGNOSIS — R319 Hematuria, unspecified: Secondary | ICD-10-CM | POA: Diagnosis not present

## 2020-01-10 DIAGNOSIS — N400 Enlarged prostate without lower urinary tract symptoms: Secondary | ICD-10-CM | POA: Diagnosis not present

## 2020-01-10 DIAGNOSIS — Z8546 Personal history of malignant neoplasm of prostate: Secondary | ICD-10-CM | POA: Diagnosis not present

## 2020-01-10 DIAGNOSIS — I10 Essential (primary) hypertension: Secondary | ICD-10-CM | POA: Diagnosis not present

## 2020-01-10 DIAGNOSIS — Z0001 Encounter for general adult medical examination with abnormal findings: Secondary | ICD-10-CM | POA: Diagnosis not present

## 2020-01-10 DIAGNOSIS — J019 Acute sinusitis, unspecified: Secondary | ICD-10-CM | POA: Diagnosis not present

## 2020-01-10 DIAGNOSIS — R079 Chest pain, unspecified: Secondary | ICD-10-CM | POA: Diagnosis not present

## 2020-01-10 DIAGNOSIS — R7301 Impaired fasting glucose: Secondary | ICD-10-CM | POA: Diagnosis not present

## 2020-01-10 DIAGNOSIS — J029 Acute pharyngitis, unspecified: Secondary | ICD-10-CM | POA: Diagnosis not present

## 2020-01-10 DIAGNOSIS — M79604 Pain in right leg: Secondary | ICD-10-CM | POA: Diagnosis not present

## 2020-01-10 DIAGNOSIS — N189 Chronic kidney disease, unspecified: Secondary | ICD-10-CM | POA: Diagnosis not present

## 2020-01-15 DIAGNOSIS — I129 Hypertensive chronic kidney disease with stage 1 through stage 4 chronic kidney disease, or unspecified chronic kidney disease: Secondary | ICD-10-CM | POA: Diagnosis not present

## 2020-01-15 DIAGNOSIS — I10 Essential (primary) hypertension: Secondary | ICD-10-CM | POA: Diagnosis not present

## 2020-01-15 DIAGNOSIS — Z8546 Personal history of malignant neoplasm of prostate: Secondary | ICD-10-CM | POA: Diagnosis not present

## 2020-01-15 DIAGNOSIS — E782 Mixed hyperlipidemia: Secondary | ICD-10-CM | POA: Diagnosis not present

## 2020-01-15 DIAGNOSIS — M25552 Pain in left hip: Secondary | ICD-10-CM | POA: Diagnosis not present

## 2020-01-15 DIAGNOSIS — R002 Palpitations: Secondary | ICD-10-CM | POA: Diagnosis not present

## 2020-01-15 DIAGNOSIS — W57XXXA Bitten or stung by nonvenomous insect and other nonvenomous arthropods, initial encounter: Secondary | ICD-10-CM | POA: Diagnosis not present

## 2020-01-15 DIAGNOSIS — M25551 Pain in right hip: Secondary | ICD-10-CM | POA: Diagnosis not present

## 2020-01-15 DIAGNOSIS — I2510 Atherosclerotic heart disease of native coronary artery without angina pectoris: Secondary | ICD-10-CM | POA: Diagnosis not present

## 2020-01-15 DIAGNOSIS — R7303 Prediabetes: Secondary | ICD-10-CM | POA: Diagnosis not present

## 2020-01-15 DIAGNOSIS — M1712 Unilateral primary osteoarthritis, left knee: Secondary | ICD-10-CM | POA: Diagnosis not present

## 2020-01-15 DIAGNOSIS — R7301 Impaired fasting glucose: Secondary | ICD-10-CM | POA: Diagnosis not present

## 2020-01-22 ENCOUNTER — Other Ambulatory Visit: Payer: Self-pay

## 2020-01-22 ENCOUNTER — Ambulatory Visit (HOSPITAL_COMMUNITY)
Admission: RE | Admit: 2020-01-22 | Discharge: 2020-01-22 | Disposition: A | Discharge status: Home / Self Care | Payer: PPO | Source: Ambulatory Visit | Attending: Urology | Admitting: Urology

## 2020-01-22 DIAGNOSIS — Z981 Arthrodesis status: Secondary | ICD-10-CM | POA: Diagnosis not present

## 2020-01-22 DIAGNOSIS — I700 Atherosclerosis of aorta: Secondary | ICD-10-CM | POA: Diagnosis not present

## 2020-01-22 DIAGNOSIS — R590 Localized enlarged lymph nodes: Secondary | ICD-10-CM | POA: Diagnosis not present

## 2020-01-22 MED ORDER — PIFLIFOLASTAT F 18 (PYLARIFY) INJECTION
9.0000 | Freq: Once | INTRAVENOUS | Status: AC
  Administered 2020-01-22: 8.7 via INTRAVENOUS

## 2020-01-23 DIAGNOSIS — Z471 Aftercare following joint replacement surgery: Secondary | ICD-10-CM | POA: Diagnosis not present

## 2020-01-23 DIAGNOSIS — Z96652 Presence of left artificial knee joint: Secondary | ICD-10-CM | POA: Diagnosis not present

## 2020-01-29 ENCOUNTER — Telehealth: Payer: Self-pay

## 2020-01-29 NOTE — Telephone Encounter (Signed)
Pt notified of Dr. Ralene Muskrat prior response.

## 2020-01-29 NOTE — Telephone Encounter (Signed)
-----   Message from Irine Seal, MD sent at 01/29/2020  8:16 AM EST ----- You can let him know that the radiologist looked back at the PET and prior films and is less concerned about the possibility of cancer spread.  I will talk to him more about it on Friday.

## 2020-01-30 NOTE — Progress Notes (Signed)
Subjective:  1. Lymphadenopathy, retroperitoneal   2. Prostate cancer ([name])   3. Urge incontinence   4. Overactive bladder   5. Lower abdominal pain     [name] returns today in f/u from recent imaging.  He had a CT for the abdominal pain he had at his last visit and was found to have some small but slightly larger left iliac adenopathy.  He then had a PSMA PET that showed mild uptake in the nodes and this is felt to be either indolent nodal metastases vs slightly enlarged benign nodes.  His PSA remains <0.1 in 10/21.  He has continue to have some abdominal discomfort and has seen GI but a cause has not been clearly determined.  He has chronic constipation.    He is having SUI and uses paper towel.  He has urgency with UUI that has progressed.  He has nocturia x 3.   He had previously been on Oxybutynin and he was given samples of Gemtesa which helped a little bit.  He had UDS in the past that showed a 221ml bladder with some instability.      ROS:  ROS:  A complete review of systems was performed.  All systems are negative except for pertinent findings as noted.   ROS  No Known Allergies  Outpatient Encounter Medications as of 01/31/2020  Medication Sig  . amLODipine (NORVASC) 10 MG tablet Take 1 tablet (10 mg total) by mouth daily.  Marland Kitchen aspirin EC 81 MG tablet Take 1 tablet (81 mg total) by mouth 2 (two) times daily. (Patient taking differently: Take 81 mg by mouth daily.)  . atorvastatin (LIPITOR) 10 MG tablet Take 10 mg by mouth daily.  . [DISCONTINUED] Vibegron (GEMTESA) 75 MG TABS Take 75 mg by mouth daily.  . nitroGLYCERIN (NITROSTAT) 0.4 MG SL tablet Place 1 tablet (0.4 mg total) under the tongue every 5 (five) minutes as needed for chest pain.   No facility-administered encounter medications on file as of 01/31/2020.    Past Medical History:  Diagnosis Date  . Arthritis   . Chronic back pain   . CKD (chronic kidney disease) stage 3, GFR 30-59 ml/min (HCC)   . Essential  hypertension   . GERD (gastroesophageal reflux disease)   . Helicobacter pylori gastritis    Treated  . Hx of adenomatous colonic polyps   . IBS (irritable bowel syndrome)   . Prostate CA ([place])    Seed implants  . Renal insufficiency   . Schatzki's ring     Past Surgical History:  Procedure Laterality Date  . APPENDECTOMY  age 20  . BACK SURGERY  02/08   [facility]  . BACK SURGERY  09/24/04   lumbar, [facility]  . BACK SURGERY  09/18/02   [place]  . BILIARY STENT PLACEMENT  01/07/2012   Procedure: BILIARY STENT PLACEMENT;  Surgeon: Rogene Houston, MD;  [facility];  Service: Endoscopy;  Laterality: N/A;  . BILIARY STENT PLACEMENT  02/21/2012   Procedure: BILIARY STENT PLACEMENT;  Surgeon: Rogene Houston, MD;  [facility];  Service: Endoscopy;  Laterality: N/A;  Biliary Stent Replacement  . CATARACT EXTRACTION W/PHACO  11/22/2010   Procedure: CATARACT EXTRACTION PHACO AND INTRAOCULAR LENS PLACEMENT (IOC);  Surgeon: Williams Che;  [facility];  Service: Ophthalmology;  Laterality: Right;  CDE: 6.81  . CHOLECYSTECTOMY  03/04/05   [facility], [facility]. Gangrenous cholecystitis complicated by abscess requiring percutaneous drainage. He also had common duct stones requiring ERCP and  sphincterotomy.  . COLONOSCOPY  August 2005   Scattered sigmoid diverticulosis, splenic flexure polyp. Hyperplastic  . COLONOSCOPY  1997   3 cm tubular adenoma and a sending colon  . COLONOSCOPY  02/01/2011   Rourk-friable anal canal, hyperplastic rectal polyp  . COLONOSCOPY N/A 08/15/2013   Procedure: COLONOSCOPY;  Surgeon: Rogene Houston, MD;  [facility];  Service: Endoscopy;  Laterality: N/A;  100  . COLONOSCOPY N/A 09/20/2018   Procedure: COLONOSCOPY;  Surgeon: Rogene Houston, MD;  [facility];  Service: Endoscopy;  Laterality: N/A;  12:00pm  . ERCP  03/02/05   [facility], R[facility]. Normal-appearing biliary tree (gallbladder not image), status post sphincterotomy with recovery of small  pieces of stone material, status post balloon occlusion cholangiogram.  . ERCP  01/07/2012   Procedure: ENDOSCOPIC RETROGRADE CHOLANGIOPANCREATOGRAPHY (ERCP);  Surgeon: Rogene Houston, MD;  [facility];  Service: Endoscopy;  Laterality: N/A;  possible biliary stenting  . ERCP  02/21/2012   Procedure: ENDOSCOPIC RETROGRADE CHOLANGIOPANCREATOGRAPHY (ERCP);  Surgeon: Rogene Houston, MD;  [facility];  Service: Endoscopy;  Laterality: N/A;  common bile duct stone and clip removed  . ERCP N/A 05/18/2012   Procedure: ENDOSCOPIC RETROGRADE CHOLANGIOPANCREATOGRAPHY (ERCP);  Surgeon: Rogene Houston, MD;  [facility];  Service: Endoscopy;  Laterality: N/A;  stone and debri removal  . ESOPHAGOGASTRODUODENOSCOPY  August 2005   Erosive esophagitis and Schatzki ring, small hiatal hernia  . ESOPHAGOGASTRODUODENOSCOPY  01/2008   Mild reflux esophagitis, small hiatal hernia  . ESOPHAGOGASTRODUODENOSCOPY  02/01/2011   Rourk-erosive reflux esophagitis, small hiatal hernia  . ESOPHAGOGASTRODUODENOSCOPY  10/26/2011   Dr. Eli Phillips gastric submucosal petechia (bx-benign ulceration), juxta ampullary duodenal diverticulum and some mucosal edema involving 1st/2nd portion of duodenum with superficial erosions (bx-superficial ulceration/benign)  . ESOPHAGOGASTRODUODENOSCOPY  02/21/2012   Procedure: ESOPHAGOGASTRODUODENOSCOPY (EGD);  Surgeon: Rogene Houston, MD;  [facility];  Service: Endoscopy;  Laterality: N/A;  . EYE SURGERY    . Incomplete colonoscopy  December 2009   Left-sided diverticula, mid descending colon polyp, due to recurrent looping and redundancy exam was incomplete. It was felt that the mid colon was reached. Followup barium enema showed colon interposition between the liver and the diaphragm, redundant sigmoid colon but no colon mass or polyp identified.. Pathology revealed tubular adenoma.  Marland Kitchen POLYPECTOMY  09/20/2018   Procedure: POLYPECTOMY;  Surgeon: Rogene Houston, MD;   [facility];  Service: Endoscopy;;  Hepatic flexure polyp cold snare, splenic flexure polyp  . SPHINCTEROTOMY  01/07/2012   Procedure: SPHINCTEROTOMY;  Surgeon: Rogene Houston, MD;  [facility];  Service: Endoscopy;  Laterality: N/A;  Extended  . SPYGLASS CHOLANGIOSCOPY  02/21/2012   Procedure: SPYGLASS CHOLANGIOSCOPY;  Surgeon: Rogene Houston, MD;  [facility];  Service: Endoscopy;  Laterality: N/A;  . SPYGLASS CHOLANGIOSCOPY N/A 05/18/2012   Procedure: SPYGLASS CHOLANGIOSCOPY;  Surgeon: Rogene Houston, MD;  [facility];  Service: Endoscopy;  Laterality: N/A;  . TOTAL KNEE ARTHROPLASTY  03/21/2012   Procedure: TOTAL KNEE ARTHROPLASTY;  Surgeon: [name], MD;  Location: [place];  Service: Orthopedics;  Laterality: Right;  right knee arthroplasty  . TOTAL KNEE ARTHROPLASTY Left 04/08/2019   Procedure: LEFT TOTAL KNEE ARTHROPLASTY;  Surgeon: Frederik Pear, MD;  [facility];  Service: Orthopedics;  Laterality: Left;    Social History   Socioeconomic History  . Marital status: Married    Spouse name: Not on file  . Number of  children: 2  . Years of education: Not on file  . Highest education level: Not on file  Occupational History  . Occupation: Retired Chemical engineer: RETIRED  Tobacco Use  . Smoking status: Never Smoker  . Smokeless tobacco: Never Used  Vaping Use  . Vaping Use: Never used  Substance and Sexual Activity  . Alcohol use: No    Alcohol/week: 0.0 standard drinks  . Drug use: No  . Sexual activity: Not on file  Other Topics Concern  . Not on file  Social History Narrative  . Not on file   Social Determinants of Health   Financial Resource Strain: Not on file  Food Insecurity: Not on file  Transportation Needs: Not on file  Physical Activity: Not on file  Stress: Not on file  Social Connections: Not on file  Intimate Partner Violence: Not on file    Family History  Problem Relation Age of Onset  . Diabetes Mother    . Coronary artery disease Mother   . Coronary artery disease Father   . Diabetes Father   . Stomach cancer Father   . Deep vein thrombosis Daughter   . Hypotension Neg Hx   . Anesthesia problems Neg Hx   . Malignant hyperthermia Neg Hx   . Pseudochol deficiency Neg Hx   . Colon cancer Neg Hx        Objective: Vitals:   01/31/20 1037  BP: (!) 148/73  Pulse: 76  Temp: 98.4 F (36.9 C)     Physical Exam  Lab Results:  No results found for this or any previous visit (from the past 24 hour(s)).  BMET No results for input(s): NA, K, CL, CO2, GLUCOSE, BUN, CREATININE, CALCIUM in the last 72 hours. PSA <0.1 in 9/20. <0.1 in 10/21     Studies/Results:  CT and PSMA PET films and reports reviewed.     PSA reviewed.   GI note reviewed.    Assessment & Plan: History of prostate cancer.   I will have him return in a year with a PSA.Marland Kitchen  Lymphadenopathy.  He may have indolent nodal mets from the prostate cancer but with the undetectible PSA that is less likely.  I will get CT on return in a year to reassess.  Lower abdominal pain.  No GU source.  Possible IBS.  OAB with urge incontinence.  He hasn't responded to meds.  I discussed PTNS but he is not interested in treatment at this time.   No orders of the defined types were placed in this encounter.    Orders Placed This Encounter  Procedures  . Urinalysis, Routine w reflex microscopic      No follow-ups on file.   CC: Celene Squibb, MD      Irine Seal 01/31/2020

## 2020-01-31 ENCOUNTER — Encounter: Payer: Self-pay | Admitting: Urology

## 2020-01-31 ENCOUNTER — Other Ambulatory Visit: Payer: Self-pay

## 2020-01-31 ENCOUNTER — Ambulatory Visit (INDEPENDENT_AMBULATORY_CARE_PROVIDER_SITE_OTHER): Payer: PPO | Admitting: Urology

## 2020-01-31 VITALS — BP 148/73 | HR 76 | Temp 98.4°F | Ht 75.0 in | Wt 256.0 lb

## 2020-01-31 DIAGNOSIS — N3941 Urge incontinence: Secondary | ICD-10-CM | POA: Diagnosis not present

## 2020-01-31 DIAGNOSIS — N3281 Overactive bladder: Secondary | ICD-10-CM

## 2020-01-31 DIAGNOSIS — C61 Malignant neoplasm of prostate: Secondary | ICD-10-CM | POA: Diagnosis not present

## 2020-01-31 DIAGNOSIS — R1030 Lower abdominal pain, unspecified: Secondary | ICD-10-CM | POA: Diagnosis not present

## 2020-01-31 DIAGNOSIS — R599 Enlarged lymph nodes, unspecified: Secondary | ICD-10-CM | POA: Diagnosis not present

## 2020-01-31 DIAGNOSIS — R590 Localized enlarged lymph nodes: Secondary | ICD-10-CM

## 2020-01-31 LAB — URINALYSIS, ROUTINE W REFLEX MICROSCOPIC
Bilirubin, UA: NEGATIVE
Glucose, UA: NEGATIVE
Ketones, UA: NEGATIVE
Leukocytes,UA: NEGATIVE
Nitrite, UA: NEGATIVE
Protein,UA: NEGATIVE
RBC, UA: NEGATIVE
Specific Gravity, UA: 1.02 (ref 1.005–1.030)
Urobilinogen, Ur: 1 mg/dL (ref 0.2–1.0)
pH, UA: 5.5 (ref 5.0–7.5)

## 2020-01-31 NOTE — Progress Notes (Signed)
Urological Symptom Review  Patient is experiencing the following symptoms: Frequent urination Getting up at night Leakage of urine Erection problems  Review of Systems  Gastrointestinal (upper)  : Negative for upper GI symptoms  Gastrointestinal (lower) : Constipation  Constitutional : Negative for symptoms  Skin: Negative for skin symptoms  Eyes: Negative for eye symptoms  Ear/Nose/Throat : Negative for Ear/Nose/Throat symptoms  Hematologic/Lymphatic: Negative for Hematologic/Lymphatic symptoms  Cardiovascular : Negative for cardiovascular symptoms  Respiratory : Negative for respiratory symptoms  Endocrine: Negative for endocrine symptoms  Musculoskeletal: Negative for musculoskeletal symptoms  Neurological: Negative for neurological symptoms  Psychologic: Negative for psychiatric symptoms

## 2020-02-06 DIAGNOSIS — J0110 Acute frontal sinusitis, unspecified: Secondary | ICD-10-CM | POA: Diagnosis not present

## 2020-02-21 DIAGNOSIS — M25551 Pain in right hip: Secondary | ICD-10-CM | POA: Diagnosis not present

## 2020-02-21 DIAGNOSIS — E782 Mixed hyperlipidemia: Secondary | ICD-10-CM | POA: Diagnosis not present

## 2020-02-21 DIAGNOSIS — I129 Hypertensive chronic kidney disease with stage 1 through stage 4 chronic kidney disease, or unspecified chronic kidney disease: Secondary | ICD-10-CM | POA: Diagnosis not present

## 2020-02-21 DIAGNOSIS — Z0001 Encounter for general adult medical examination with abnormal findings: Secondary | ICD-10-CM | POA: Diagnosis not present

## 2020-02-21 DIAGNOSIS — R7303 Prediabetes: Secondary | ICD-10-CM | POA: Diagnosis not present

## 2020-02-21 DIAGNOSIS — M1712 Unilateral primary osteoarthritis, left knee: Secondary | ICD-10-CM | POA: Diagnosis not present

## 2020-02-21 DIAGNOSIS — R7301 Impaired fasting glucose: Secondary | ICD-10-CM | POA: Diagnosis not present

## 2020-02-21 DIAGNOSIS — Z8546 Personal history of malignant neoplasm of prostate: Secondary | ICD-10-CM | POA: Diagnosis not present

## 2020-02-21 DIAGNOSIS — W57XXXA Bitten or stung by nonvenomous insect and other nonvenomous arthropods, initial encounter: Secondary | ICD-10-CM | POA: Diagnosis not present

## 2020-02-21 DIAGNOSIS — I2510 Atherosclerotic heart disease of native coronary artery without angina pectoris: Secondary | ICD-10-CM | POA: Diagnosis not present

## 2020-02-21 DIAGNOSIS — I10 Essential (primary) hypertension: Secondary | ICD-10-CM | POA: Diagnosis not present

## 2020-02-21 DIAGNOSIS — M25552 Pain in left hip: Secondary | ICD-10-CM | POA: Diagnosis not present

## 2020-03-30 ENCOUNTER — Ambulatory Visit (INDEPENDENT_AMBULATORY_CARE_PROVIDER_SITE_OTHER): Payer: PPO | Admitting: Gastroenterology

## 2020-04-11 DIAGNOSIS — H60502 Unspecified acute noninfective otitis externa, left ear: Secondary | ICD-10-CM | POA: Diagnosis not present

## 2020-04-20 DIAGNOSIS — R7303 Prediabetes: Secondary | ICD-10-CM | POA: Diagnosis not present

## 2020-04-20 DIAGNOSIS — M25551 Pain in right hip: Secondary | ICD-10-CM | POA: Diagnosis not present

## 2020-04-20 DIAGNOSIS — M1712 Unilateral primary osteoarthritis, left knee: Secondary | ICD-10-CM | POA: Diagnosis not present

## 2020-04-20 DIAGNOSIS — Z96652 Presence of left artificial knee joint: Secondary | ICD-10-CM | POA: Diagnosis not present

## 2020-04-20 DIAGNOSIS — I10 Essential (primary) hypertension: Secondary | ICD-10-CM | POA: Diagnosis not present

## 2020-04-20 DIAGNOSIS — I129 Hypertensive chronic kidney disease with stage 1 through stage 4 chronic kidney disease, or unspecified chronic kidney disease: Secondary | ICD-10-CM | POA: Diagnosis not present

## 2020-04-20 DIAGNOSIS — M25552 Pain in left hip: Secondary | ICD-10-CM | POA: Diagnosis not present

## 2020-04-20 DIAGNOSIS — E782 Mixed hyperlipidemia: Secondary | ICD-10-CM | POA: Diagnosis not present

## 2020-04-20 DIAGNOSIS — R002 Palpitations: Secondary | ICD-10-CM | POA: Diagnosis not present

## 2020-04-20 DIAGNOSIS — W57XXXA Bitten or stung by nonvenomous insect and other nonvenomous arthropods, initial encounter: Secondary | ICD-10-CM | POA: Diagnosis not present

## 2020-05-01 DIAGNOSIS — I872 Venous insufficiency (chronic) (peripheral): Secondary | ICD-10-CM | POA: Diagnosis not present

## 2020-05-01 DIAGNOSIS — N1831 Chronic kidney disease, stage 3a: Secondary | ICD-10-CM | POA: Diagnosis not present

## 2020-05-01 DIAGNOSIS — I129 Hypertensive chronic kidney disease with stage 1 through stage 4 chronic kidney disease, or unspecified chronic kidney disease: Secondary | ICD-10-CM | POA: Diagnosis not present

## 2020-05-01 DIAGNOSIS — R600 Localized edema: Secondary | ICD-10-CM | POA: Diagnosis not present

## 2020-05-01 DIAGNOSIS — I10 Essential (primary) hypertension: Secondary | ICD-10-CM | POA: Diagnosis not present

## 2020-05-27 DIAGNOSIS — R319 Hematuria, unspecified: Secondary | ICD-10-CM | POA: Diagnosis not present

## 2020-05-27 DIAGNOSIS — N189 Chronic kidney disease, unspecified: Secondary | ICD-10-CM | POA: Diagnosis not present

## 2020-05-27 DIAGNOSIS — R7301 Impaired fasting glucose: Secondary | ICD-10-CM | POA: Diagnosis not present

## 2020-05-27 DIAGNOSIS — R944 Abnormal results of kidney function studies: Secondary | ICD-10-CM | POA: Diagnosis not present

## 2020-05-27 DIAGNOSIS — R079 Chest pain, unspecified: Secondary | ICD-10-CM | POA: Diagnosis not present

## 2020-05-27 DIAGNOSIS — M79604 Pain in right leg: Secondary | ICD-10-CM | POA: Diagnosis not present

## 2020-05-27 DIAGNOSIS — R600 Localized edema: Secondary | ICD-10-CM | POA: Diagnosis not present

## 2020-05-27 DIAGNOSIS — Z0001 Encounter for general adult medical examination with abnormal findings: Secondary | ICD-10-CM | POA: Diagnosis not present

## 2020-05-27 DIAGNOSIS — N1831 Chronic kidney disease, stage 3a: Secondary | ICD-10-CM | POA: Diagnosis not present

## 2020-05-27 DIAGNOSIS — I10 Essential (primary) hypertension: Secondary | ICD-10-CM | POA: Diagnosis not present

## 2020-05-27 DIAGNOSIS — I872 Venous insufficiency (chronic) (peripheral): Secondary | ICD-10-CM | POA: Diagnosis not present

## 2020-05-27 DIAGNOSIS — N400 Enlarged prostate without lower urinary tract symptoms: Secondary | ICD-10-CM | POA: Diagnosis not present

## 2020-05-27 DIAGNOSIS — Z8546 Personal history of malignant neoplasm of prostate: Secondary | ICD-10-CM | POA: Diagnosis not present

## 2020-05-27 DIAGNOSIS — I129 Hypertensive chronic kidney disease with stage 1 through stage 4 chronic kidney disease, or unspecified chronic kidney disease: Secondary | ICD-10-CM | POA: Diagnosis not present

## 2020-05-27 DIAGNOSIS — J019 Acute sinusitis, unspecified: Secondary | ICD-10-CM | POA: Diagnosis not present

## 2020-05-27 DIAGNOSIS — J029 Acute pharyngitis, unspecified: Secondary | ICD-10-CM | POA: Diagnosis not present

## 2020-05-29 DIAGNOSIS — I8311 Varicose veins of right lower extremity with inflammation: Secondary | ICD-10-CM | POA: Diagnosis not present

## 2020-05-29 DIAGNOSIS — D225 Melanocytic nevi of trunk: Secondary | ICD-10-CM | POA: Diagnosis not present

## 2020-05-29 DIAGNOSIS — L814 Other melanin hyperpigmentation: Secondary | ICD-10-CM | POA: Diagnosis not present

## 2020-05-29 DIAGNOSIS — L821 Other seborrheic keratosis: Secondary | ICD-10-CM | POA: Diagnosis not present

## 2020-05-29 DIAGNOSIS — I8312 Varicose veins of left lower extremity with inflammation: Secondary | ICD-10-CM | POA: Diagnosis not present

## 2020-05-29 DIAGNOSIS — I872 Venous insufficiency (chronic) (peripheral): Secondary | ICD-10-CM | POA: Diagnosis not present

## 2020-05-29 DIAGNOSIS — D1801 Hemangioma of skin and subcutaneous tissue: Secondary | ICD-10-CM | POA: Diagnosis not present

## 2020-05-29 DIAGNOSIS — L570 Actinic keratosis: Secondary | ICD-10-CM | POA: Diagnosis not present

## 2020-06-21 DIAGNOSIS — E782 Mixed hyperlipidemia: Secondary | ICD-10-CM | POA: Diagnosis not present

## 2020-06-21 DIAGNOSIS — I10 Essential (primary) hypertension: Secondary | ICD-10-CM | POA: Diagnosis not present

## 2020-06-21 DIAGNOSIS — R7303 Prediabetes: Secondary | ICD-10-CM | POA: Diagnosis not present

## 2020-06-21 DIAGNOSIS — Z8546 Personal history of malignant neoplasm of prostate: Secondary | ICD-10-CM | POA: Diagnosis not present

## 2020-06-21 DIAGNOSIS — I129 Hypertensive chronic kidney disease with stage 1 through stage 4 chronic kidney disease, or unspecified chronic kidney disease: Secondary | ICD-10-CM | POA: Diagnosis not present

## 2020-06-21 DIAGNOSIS — N1832 Chronic kidney disease, stage 3b: Secondary | ICD-10-CM | POA: Diagnosis not present

## 2020-06-21 DIAGNOSIS — I2510 Atherosclerotic heart disease of native coronary artery without angina pectoris: Secondary | ICD-10-CM | POA: Diagnosis not present

## 2020-06-21 DIAGNOSIS — M179 Osteoarthritis of knee, unspecified: Secondary | ICD-10-CM | POA: Diagnosis not present

## 2020-07-08 DIAGNOSIS — I10 Essential (primary) hypertension: Secondary | ICD-10-CM | POA: Diagnosis not present

## 2020-07-10 DIAGNOSIS — L578 Other skin changes due to chronic exposure to nonionizing radiation: Secondary | ICD-10-CM | POA: Diagnosis not present

## 2020-07-10 DIAGNOSIS — L820 Inflamed seborrheic keratosis: Secondary | ICD-10-CM | POA: Diagnosis not present

## 2020-07-14 DIAGNOSIS — Z8546 Personal history of malignant neoplasm of prostate: Secondary | ICD-10-CM | POA: Diagnosis not present

## 2020-07-14 DIAGNOSIS — E782 Mixed hyperlipidemia: Secondary | ICD-10-CM | POA: Diagnosis not present

## 2020-07-14 DIAGNOSIS — I2510 Atherosclerotic heart disease of native coronary artery without angina pectoris: Secondary | ICD-10-CM | POA: Diagnosis not present

## 2020-07-14 DIAGNOSIS — I129 Hypertensive chronic kidney disease with stage 1 through stage 4 chronic kidney disease, or unspecified chronic kidney disease: Secondary | ICD-10-CM | POA: Diagnosis not present

## 2020-07-14 DIAGNOSIS — R7303 Prediabetes: Secondary | ICD-10-CM | POA: Diagnosis not present

## 2020-07-14 DIAGNOSIS — N1832 Chronic kidney disease, stage 3b: Secondary | ICD-10-CM | POA: Diagnosis not present

## 2020-07-14 DIAGNOSIS — M179 Osteoarthritis of knee, unspecified: Secondary | ICD-10-CM | POA: Diagnosis not present

## 2020-08-20 DIAGNOSIS — I2510 Atherosclerotic heart disease of native coronary artery without angina pectoris: Secondary | ICD-10-CM | POA: Diagnosis not present

## 2020-08-20 DIAGNOSIS — I10 Essential (primary) hypertension: Secondary | ICD-10-CM | POA: Diagnosis not present

## 2020-09-14 DIAGNOSIS — U071 COVID-19: Secondary | ICD-10-CM | POA: Diagnosis not present

## 2020-09-20 DIAGNOSIS — I10 Essential (primary) hypertension: Secondary | ICD-10-CM | POA: Diagnosis not present

## 2020-09-20 DIAGNOSIS — I2510 Atherosclerotic heart disease of native coronary artery without angina pectoris: Secondary | ICD-10-CM | POA: Diagnosis not present

## 2020-09-23 ENCOUNTER — Encounter: Payer: Self-pay | Admitting: Emergency Medicine

## 2020-09-23 ENCOUNTER — Ambulatory Visit
Admission: EM | Admit: 2020-09-23 | Discharge: 2020-09-23 | Disposition: A | Discharge status: Home / Self Care | Payer: PPO | Attending: Family Medicine | Admitting: Family Medicine

## 2020-09-23 ENCOUNTER — Other Ambulatory Visit: Payer: Self-pay

## 2020-09-23 DIAGNOSIS — H9201 Otalgia, right ear: Secondary | ICD-10-CM | POA: Diagnosis not present

## 2020-09-23 DIAGNOSIS — H60501 Unspecified acute noninfective otitis externa, right ear: Secondary | ICD-10-CM

## 2020-09-23 MED ORDER — NEOMYCIN-POLYMYXIN-HC 3.5-10000-1 OT SUSP: 4.0000 [drp] | Freq: Three times a day (TID) | OTIC | 0 refills | Status: DC

## 2020-09-23 NOTE — ED Provider Notes (Signed)
[place]   [id number] 09/23/20 Arrival Time: [place] PLAN:  1. Acute otalgia, right   2. Acute otitis externa of right ear, unspecified type    Begin: Meds ordered this encounter  Medications   neomycin-polymyxin-hydrocortisone (CORTISPORIN) 3.5-10000-1 OTIC suspension    Sig: Place 4 drops into the right ear 3 (three) times daily.    Dispense:  10 mL    Refill:  0   OTC symptom care as needed. May f/u with PCP or here as needed.  Reviewed expectations re: course of current medical issues. Questions answered. Outlined signs and symptoms indicating need for more acute intervention. Patient verbalized understanding. After Visit Summary given.   SUBJECTIVE: History from: patient.  [name] is a 80 y.o. male who presents with complaint of right otalgia; without drainage; without bleeding. Onset gradual, a week ago. Recent cold symptoms: none. Fever: no. Overall normal PO intake without n/v. Mild ST. Sick contacts: wife with dry cough. OTC treatment: none reported.  Social History   Tobacco Use  Smoking Status Never  Smokeless Tobacco Never    OBJECTIVE:  Vitals:   09/23/20 1134  BP: 108/69  Pulse: 64  Resp: 18  Temp: 98.7 F (37.1 C)  TempSrc: Temporal  SpO2: 94%     General appearance: alert; NAD Ear Canal: edema and inflammation on the right TM: right: normal Neck: supple without LAD Lungs: unlabored respirations, symmetrical air entry; cough: absent; no respiratory distress Skin: warm and dry Psychological: alert and cooperative; normal mood and affect  No Known Allergies  Past Medical History:  Diagnosis Date   Arthritis    Chronic back pain    CKD (chronic kidney disease) stage 3, GFR 30-59 ml/min (HCC)    Essential hypertension    GERD (gastroesophageal reflux disease)    Helicobacter pylori gastritis    Treated   Hx of adenomatous colonic polyps    IBS (irritable bowel syndrome)    Prostate CA (HCC)    Seed  implants   Renal insufficiency    Schatzki's ring    Family History  Problem Relation Age of Onset   Diabetes Mother    Coronary artery disease Mother    Coronary artery disease Father    Diabetes Father    Stomach cancer Father    Deep vein thrombosis Daughter    Hypotension Neg Hx    Anesthesia problems Neg Hx    Malignant hyperthermia Neg Hx    Pseudochol deficiency Neg Hx    Colon cancer Neg Hx    Social History   Socioeconomic History   Marital status: Married    Spouse name: Not on file   Number of children: 2   Years of education: Not on file   Highest education level: Not on file  Occupational History   Occupation: Retired Chemical engineer: RETIRED  Tobacco Use   Smoking status: Never   Smokeless tobacco: Never  Vaping Use   Vaping Use: Never used  Substance and Sexual Activity   Alcohol use: No    Alcohol/week: 0.0 standard drinks   Drug use: No   Sexual activity: Not on file  Other Topics Concern   Not on file  Social History Narrative   Not on file   Social Determinants of Health   Financial Resource Strain: Not on file  Food Insecurity: Not on file  Transportation Needs: Not on file  Physical Activity: Not on file  Stress: Not on file  Social Connections: Not on file  Intimate Partner Violence: Not on file             Vanessa Kick, MD 09/23/20 1304

## 2020-09-23 NOTE — ED Triage Notes (Signed)
Right ear pain x 1 week

## 2020-10-13 DIAGNOSIS — M2012 Hallux valgus (acquired), left foot: Secondary | ICD-10-CM | POA: Diagnosis not present

## 2020-10-13 DIAGNOSIS — M7742 Metatarsalgia, left foot: Secondary | ICD-10-CM | POA: Diagnosis not present

## 2020-10-13 DIAGNOSIS — L84 Corns and callosities: Secondary | ICD-10-CM | POA: Diagnosis not present

## 2020-10-13 DIAGNOSIS — M2011 Hallux valgus (acquired), right foot: Secondary | ICD-10-CM | POA: Diagnosis not present

## 2020-10-21 DIAGNOSIS — I10 Essential (primary) hypertension: Secondary | ICD-10-CM | POA: Diagnosis not present

## 2020-10-21 DIAGNOSIS — I2510 Atherosclerotic heart disease of native coronary artery without angina pectoris: Secondary | ICD-10-CM | POA: Diagnosis not present

## 2020-11-17 DIAGNOSIS — Z23 Encounter for immunization: Secondary | ICD-10-CM | POA: Diagnosis not present

## 2020-12-17 ENCOUNTER — Telehealth: Payer: Self-pay | Admitting: Cardiology

## 2020-12-17 NOTE — Telephone Encounter (Signed)
Reports SOB and fatigue has gradually gotten worse over time especially with activity Reports that he felt like he was going to pass out a few times. Does not have home equipment to check BP Denies chest pain Unable to review medication says he wasn't at home Offered first available appointment with Katina Dung, NP tomorrow. Patient declined and says he has an appointment with his family doctor tomorrow morning at 9:00 am.  Already scheduled to see [facility] on 01/07/2021 @10 :00 am Advised to keep visits as planned and if symptoms get worse, to go to the ED for an evaluation Verbalized understanding of plan

## 2020-12-17 NOTE — Telephone Encounter (Signed)
Pt c/o Shortness Of Breath: STAT if SOB developed within the last 24 hours or pt is noticeably SOB on the phone  1. Are you currently SOB (can you hear that pt is SOB on the phone)? no  2. How long have you been experiencing SOB? 3 weeks  3. Are you SOB when sitting or when up moving around? Moving around  4. Are you currently experiencing any other symptoms? Has felt faint   Patient states for the last 3 weeks he has noticed an increase in SOB on exertion. He says he will get SOB even just raking leaves. He says this is usual for him. He also states a couple times he has felt like he was going to pass out a few times, but is not sure if it is because he got up too fast. He says he does not have symptoms now.

## 2020-12-18 DIAGNOSIS — R5383 Other fatigue: Secondary | ICD-10-CM | POA: Diagnosis not present

## 2020-12-18 DIAGNOSIS — R0609 Other forms of dyspnea: Secondary | ICD-10-CM | POA: Diagnosis not present

## 2020-12-21 ENCOUNTER — Other Ambulatory Visit (HOSPITAL_COMMUNITY): Payer: Self-pay | Admitting: Internal Medicine

## 2020-12-21 ENCOUNTER — Other Ambulatory Visit: Payer: Self-pay | Admitting: Internal Medicine

## 2020-12-21 DIAGNOSIS — R0609 Other forms of dyspnea: Secondary | ICD-10-CM

## 2020-12-21 DIAGNOSIS — R7989 Other specified abnormal findings of blood chemistry: Secondary | ICD-10-CM

## 2020-12-22 ENCOUNTER — Other Ambulatory Visit: Payer: Self-pay

## 2020-12-22 ENCOUNTER — Ambulatory Visit (HOSPITAL_COMMUNITY)
Admission: RE | Admit: 2020-12-22 | Discharge: 2020-12-22 | Disposition: A | Discharge status: Home / Self Care | Payer: PPO | Source: Ambulatory Visit | Attending: Internal Medicine | Admitting: Internal Medicine

## 2020-12-22 DIAGNOSIS — R7989 Other specified abnormal findings of blood chemistry: Secondary | ICD-10-CM | POA: Insufficient documentation

## 2020-12-22 DIAGNOSIS — M79604 Pain in right leg: Secondary | ICD-10-CM | POA: Diagnosis not present

## 2020-12-23 ENCOUNTER — Other Ambulatory Visit (HOSPITAL_COMMUNITY): Payer: Self-pay | Admitting: Internal Medicine

## 2020-12-23 ENCOUNTER — Ambulatory Visit (HOSPITAL_COMMUNITY)
Admission: RE | Admit: 2020-12-23 | Discharge: 2020-12-23 | Disposition: A | Discharge status: Home / Self Care | Payer: PPO | Source: Ambulatory Visit | Attending: Internal Medicine | Admitting: Internal Medicine

## 2020-12-23 ENCOUNTER — Encounter (HOSPITAL_COMMUNITY)
Admission: RE | Admit: 2020-12-23 | Discharge: 2020-12-23 | Disposition: A | Discharge status: Home / Self Care | Payer: PPO | Source: Ambulatory Visit | Attending: Internal Medicine | Admitting: Internal Medicine

## 2020-12-23 ENCOUNTER — Encounter (HOSPITAL_COMMUNITY): Payer: Self-pay

## 2020-12-23 DIAGNOSIS — R0602 Shortness of breath: Secondary | ICD-10-CM | POA: Diagnosis not present

## 2020-12-23 DIAGNOSIS — R0609 Other forms of dyspnea: Secondary | ICD-10-CM

## 2020-12-23 DIAGNOSIS — R7989 Other specified abnormal findings of blood chemistry: Secondary | ICD-10-CM | POA: Insufficient documentation

## 2020-12-23 DIAGNOSIS — R0600 Dyspnea, unspecified: Secondary | ICD-10-CM | POA: Diagnosis not present

## 2020-12-23 MED ORDER — TECHNETIUM TO 99M ALBUMIN AGGREGATED
4.0000 | Freq: Once | INTRAVENOUS | Status: AC | PRN
  Administered 2020-12-23: 4.3 via INTRAVENOUS

## 2021-01-05 NOTE — Progress Notes (Deleted)
Cardiology Office Note  Date: 01/05/2021   ID: [name], DOB 12/25/1940, MRN [id number]  PCP:  Celene Squibb, MD  Cardiologist:  Rozann Lesches, MD Electrophysiologist:  None   No chief complaint on file.   History of Present Illness: [name] is an 80 y.o. male last seen in July 2021.  Past Medical History:  Diagnosis Date   Arthritis    Chronic back pain    CKD (chronic kidney disease) stage 3, GFR 30-59 ml/min (HCC)    Essential hypertension    GERD (gastroesophageal reflux disease)    Helicobacter pylori gastritis    Treated   Hx of adenomatous colonic polyps    IBS (irritable bowel syndrome)    Prostate CA (HCC)    Seed implants   Renal insufficiency    Schatzki's ring     Past Surgical History:  Procedure Laterality Date   APPENDECTOMY  age 19   BACK SURGERY  02/08   [facility]   BACK SURGERY  09/24/04   lumbar, [facility]   BACK SURGERY  09/18/02   [facility]   BILIARY STENT PLACEMENT  01/07/2012   Procedure: BILIARY STENT PLACEMENT;  Surgeon: Rogene Houston, MD;  Location: [facility];  Service: Endoscopy;  Laterality: N/A;   BILIARY STENT PLACEMENT  02/21/2012   Procedure: BILIARY STENT PLACEMENT;  Surgeon: Rogene Houston, MD;  Location: [facility];  Service: Endoscopy;  Laterality: N/A;  Biliary Stent Replacement   CATARACT EXTRACTION W/PHACO  11/22/2010   Procedure: CATARACT EXTRACTION PHACO AND INTRAOCULAR LENS PLACEMENT ([facility]);  Surgeon: Williams Che;  Location: [facility];  Service: Ophthalmology;  Laterality: Right;  CDE: 6.81   CHOLECYSTECTOMY  03/04/05   [facility], [facility]. Gangrenous cholecystitis complicated by abscess requiring percutaneous drainage. He also had common duct stones requiring ERCP and sphincterotomy.   COLONOSCOPY  August 2005   Scattered sigmoid diverticulosis, splenic flexure polyp. Hyperplastic   COLONOSCOPY  1997   3 cm tubular adenoma and a sending colon   COLONOSCOPY  02/01/2011   Rourk-friable anal canal, hyperplastic rectal polyp   COLONOSCOPY  N/A 08/15/2013   Procedure: COLONOSCOPY;  Surgeon: Rogene Houston, MD;  [facility];  Service: Endoscopy;  Laterality: N/A;  100   COLONOSCOPY N/A 09/20/2018   Procedure: COLONOSCOPY;  Surgeon: Rogene Houston, MD;  [facility];  Service: Endoscopy;  Laterality: N/A;  12:00pm   ERCP  03/02/05   [facility], [facility]. Normal-appearing biliary tree (gallbladder not image), status post sphincterotomy with recovery of small pieces of stone material, status post balloon occlusion cholangiogram.   ERCP  01/07/2012   Procedure: ENDOSCOPIC RETROGRADE CHOLANGIOPANCREATOGRAPHY (ERCP);  Surgeon: Rogene Houston, MD;  Location: [facility];  Service: Endoscopy;  Laterality: N/A;  possible biliary stenting   ERCP  02/21/2012   Procedure: ENDOSCOPIC RETROGRADE CHOLANGIOPANCREATOGRAPHY (ERCP);  Surgeon: Rogene Houston, MD;  Location: [facility];  Service: Endoscopy;  Laterality: N/A;  common bile duct stone and clip removed   ERCP N/A 05/18/2012   Procedure: ENDOSCOPIC RETROGRADE CHOLANGIOPANCREATOGRAPHY (ERCP);  Surgeon: Rogene Houston, MD;  Location: [facility];  Service: Endoscopy;  Laterality: N/A;  stone and debri removal   ESOPHAGOGASTRODUODENOSCOPY  August 2005   Erosive esophagitis and Schatzki ring, small hiatal hernia   ESOPHAGOGASTRODUODENOSCOPY  01/2008   Mild reflux esophagitis, small hiatal hernia   ESOPHAGOGASTRODUODENOSCOPY  02/01/2011   Rourk-erosive reflux esophagitis, small hiatal hernia   ESOPHAGOGASTRODUODENOSCOPY  10/26/2011   Dr. Eli Phillips gastric submucosal  petechia (bx-benign ulceration), juxta ampullary duodenal diverticulum and some mucosal edema involving 1st/2nd portion of duodenum with superficial erosions (bx-superficial ulceration/benign)   ESOPHAGOGASTRODUODENOSCOPY  02/21/2012   Procedure: ESOPHAGOGASTRODUODENOSCOPY (EGD);  Surgeon: Rogene Houston, MD;  Location: [facility];  Service: Endoscopy;  Laterality: N/A;   EYE SURGERY     Incomplete colonoscopy  December 2009    Left-sided diverticula, mid descending colon polyp, due to recurrent looping and redundancy exam was incomplete. It was felt that the mid colon was reached. Followup barium enema showed colon interposition between the liver and the diaphragm, redundant sigmoid colon but no colon mass or polyp identified.. Pathology revealed tubular adenoma.   POLYPECTOMY  09/20/2018   Procedure: POLYPECTOMY;  Surgeon: Rogene Houston, MD;  [facility];  Service: Endoscopy;;  Hepatic flexure polyp cold snare, splenic flexure polyp   SPHINCTEROTOMY  01/07/2012   Procedure: SPHINCTEROTOMY;  Surgeon: Rogene Houston, MD;  Location: [facility];  Service: Endoscopy;  Laterality: N/A;  Extended   SPYGLASS CHOLANGIOSCOPY  02/21/2012   Procedure: SPYGLASS CHOLANGIOSCOPY;  Surgeon: Rogene Houston, MD;  Location: [facility];  Service: Endoscopy;  Laterality: N/A;   SPYGLASS CHOLANGIOSCOPY N/A 05/18/2012   Procedure: SPYGLASS CHOLANGIOSCOPY;  Surgeon: Rogene Houston, MD;  Location: [facility];  Service: Endoscopy;  Laterality: N/A;   TOTAL KNEE ARTHROPLASTY  03/21/2012   Procedure: TOTAL KNEE ARTHROPLASTY;  Surgeon: [name], MD;  Location: [facility];  Service: Orthopedics;  Laterality: Right;  right knee arthroplasty   TOTAL KNEE ARTHROPLASTY Left 04/08/2019   Procedure: LEFT TOTAL KNEE ARTHROPLASTY;  Surgeon: Frederik Pear, MD;  Location: [facility];  Service: Orthopedics;  Laterality: Left;    Current Outpatient Medications  Medication Sig Dispense Refill   amLODipine (NORVASC) 10 MG tablet Take 1 tablet (10 mg total) by mouth daily. 90 tablet 3   aspirin EC 81 MG tablet Take 1 tablet (81 mg total) by mouth 2 (two) times daily. (Patient taking differently: Take 81 mg by mouth daily.) 60 tablet 0   atorvastatin (LIPITOR) 10 MG tablet Take 10 mg by mouth daily.     neomycin-polymyxin-hydrocortisone (CORTISPORIN) 3.5-10000-1 OTIC suspension Place 4 drops into the right ear 3 (three) times daily. 10 mL 0   nitroGLYCERIN  (NITROSTAT) 0.4 MG SL tablet Place 1 tablet (0.4 mg total) under the tongue every 5 (five) minutes as needed for chest pain. 25 tablet 3   No current facility-administered medications for this visit.   Allergies:  Patient has no known allergies.   Social History: The patient  reports that he has never smoked. He has never used smokeless tobacco. He reports that he does not drink alcohol and does not use drugs.   Family History: The patient's family history includes Coronary artery disease in his father and mother; Deep vein thrombosis in his daughter; Diabetes in his father and mother; Stomach cancer in his father.   ROS:  Please see the history of present illness. Otherwise, complete review of systems is positive for {NONE DEFAULTED:18576}.  All other systems are reviewed and negative.   Physical Exam: VS:  There were no vitals taken for this visit., BMI There is no height or weight on file to calculate BMI.  Wt Readings from Last 3 Encounters:  01/31/20 256 lb (116.1 kg)  12/25/19 256 lb (116.1 kg)  12/06/19 255 lb (115.7 kg)    General: Patient appears comfortable at rest. HEENT: Conjunctiva and lids normal, oropharynx clear with moist mucosa. Neck: Supple,  no elevated JVP or carotid bruits, no thyromegaly. Lungs: Clear to auscultation, nonlabored breathing at rest. Cardiac: Regular rate and rhythm, no S3 or significant systolic murmur, no pericardial rub. Abdomen: Soft, nontender, no hepatomegaly, bowel sounds present, no guarding or rebound. Extremities: No pitting edema, distal pulses 2+. Skin: Warm and dry. Musculoskeletal: No kyphosis. Neuropsychiatric: Alert and oriented x3, affect grossly appropriate.  ECG:  An ECG dated 01/22/2019 was personally reviewed today and demonstrated:  Sinus rhythm with prolonged PR interval, right bundle branch block, left anterior fascicular block, frequent PACs.  Recent Labwork:    Component Value Date/Time   CHOL 200 11/10/2015 0734   TRIG  208 (H) 11/10/2015 0734   HDL 34 (L) 11/10/2015 0734   CHOLHDL 5.9 (H) 11/10/2015 0734   VLDL 42 (H) 11/10/2015 0734   LDLCALC 124 11/10/2015 0734  May 2022: Hemoglobin A1c 5.8%, cholesterol 150, triglycerides 121, HDL 34, LDL 94, BUN 20, creatinine 1.58, potassium 4.4, AST 26, ALT 21, hemoglobin 15.5, platelets 158  Other Studies Reviewed Today:  Echocardiogram 01/23/2019:  1. Left ventricular ejection fraction, by visual estimation, is 60 to  65%. The left ventricle has normal function. There is no left ventricular  hypertrophy.   2. Elevated left ventricular end-diastolic pressure.   3. Left ventricular diastolic parameters are consistent with Grade I  diastolic dysfunction (impaired relaxation).   4. Global right ventricle has normal systolic function.The right  ventricular size is normal. No increase in right ventricular wall  thickness.   5. Left atrial size was normal.   6. Right atrial size was normal.   7. Mild aortic valve annular calcification.   8. Mild mitral annular calcification.   9. The mitral valve is grossly normal. Trace mitral valve regurgitation.  10. The tricuspid valve is grossly normal. Tricuspid valve regurgitation  is trivial.  11. The aortic valve is tricuspid. Aortic valve regurgitation is mild.  Mild aortic valve sclerosis without stenosis.  12. There is Mild calcification of the aortic valve.  13. There is Mild thickening of the aortic valve.  14. The pulmonic valve was grossly normal. Pulmonic valve regurgitation is  not visualized.  15. Aortic dilatation noted.  16. There is mild dilatation of the aortic root.  17. Normal pulmonary artery systolic pressure.  18. The interatrial septum was not well visualized.   Lexiscan Myoview 09/06/2017: Blood pressure demonstrated a normal response to exercise. There was no ST segment deviation noted during stress. This is a low risk study. The left ventricular ejection fraction is normal (55-65%). Moderate  size mild intensity inferior defect with very mild reversibility. The normal wall motion would suggest possible subdiaphragmatic attenuation, however cannot exclude prior infarct with very mild per-infarct ischemia. Either findings would support low risk.  Assessment and Plan:   Medication Adjustments/Labs and Tests Ordered: Current medicines are reviewed at length with the patient today.  Concerns regarding medicines are outlined above.   Tests Ordered: No orders of the defined types were placed in this encounter.   Medication Changes: No orders of the defined types were placed in this encounter.   Disposition:  Follow up {follow up:15908}  Signed, Satira Sark, MD, [facility] 01/05/2021 4:06 PM    [facility] [address] [place], [place] [address] Phone: [phone]; Fax: [phone]

## 2021-01-07 ENCOUNTER — Ambulatory Visit: Payer: PPO | Admitting: Cardiology

## 2021-01-07 DIAGNOSIS — I25119 Atherosclerotic heart disease of native coronary artery with unspecified angina pectoris: Secondary | ICD-10-CM

## 2021-01-10 NOTE — Progress Notes (Signed)
Cardiology Office Note  Date: 01/11/2021   ID: [name], DOB May 03, 1940, MRN [id number]  PCP:  Celene Squibb, MD  Cardiologist:  Rozann Lesches, MD Electrophysiologist:  None   Chief Complaint  Patient presents with   Cardiac follow-up    History of Present Illness: [name] is an 80 y.o. male last seen in July 2021.  He is referred back to the office by Dr. Nevada Crane due to recent exertional symptoms.  He states that over the last month he has been more short of breath with activity, NYHA class II-III.  No definite chest pain but a sense of fatigue as well.  He has a history of medically managed ischemic heart disease based on previous Myoview in 2019 showing possible inferior scar with mild peri-infarct ischemia.  I personally reviewed his ECG today which shows sinus bradycardia with prolonged PR interval, right bundle branch block, and left anterior fascicular block.  He is not on beta-blocker therapy.  Current regimen includes aspirin, Norvasc, losartan, and Lipitor.  He also has CKD stage IIIb, creatinine was 1.58 in May with GFR 44.  We are requesting his most recent lab work from Dr. Nevada Crane.  He did undergo a recent chest x-ray and VQ scan on November 2 for investigation of his shortness of breath and also reportedly an abnormal D-dimer level.  These studies were both reassuring.  Past Medical History:  Diagnosis Date   Arthritis    Chronic back pain    CKD (chronic kidney disease) stage 3, GFR 30-59 ml/min (HCC)    Essential hypertension    GERD (gastroesophageal reflux disease)    Helicobacter pylori gastritis    Treated   Hx of adenomatous colonic polyps    IBS (irritable bowel syndrome)    Prostate CA (HCC)    Seed implants   Renal insufficiency    Schatzki's ring     Past Surgical History:  Procedure Laterality Date   APPENDECTOMY  age 70   BACK SURGERY  02/08   [facility]   BACK SURGERY  09/24/04   lumbar, [facility]   BACK SURGERY  09/18/02   [facility]   BILIARY  STENT PLACEMENT  01/07/2012   Procedure: BILIARY STENT PLACEMENT;  Surgeon: Rogene Houston, MD;  Location: [facility];  Service: Endoscopy;  Laterality: N/A;   BILIARY STENT PLACEMENT  02/21/2012   Procedure: BILIARY STENT PLACEMENT;  Surgeon: Rogene Houston, MD;  Location: [facility];  Service: Endoscopy;  Laterality: N/A;  Biliary Stent Replacement   CATARACT EXTRACTION W/PHACO  11/22/2010   Procedure: CATARACT EXTRACTION PHACO AND INTRAOCULAR LENS PLACEMENT ([place]);  Surgeon: Williams Che;  Location: [facility];  Service: Ophthalmology;  Laterality: Right;  CDE: 6.81   CHOLECYSTECTOMY  03/04/05   [facility], [facility]. Gangrenous cholecystitis complicated by abscess requiring percutaneous drainage. He also had common duct stones requiring ERCP and sphincterotomy.   COLONOSCOPY  August 2005   Scattered sigmoid diverticulosis, splenic flexure polyp. Hyperplastic   COLONOSCOPY  1997   3 cm tubular adenoma and a sending colon   COLONOSCOPY  02/01/2011   Rourk-friable anal canal, hyperplastic rectal polyp   COLONOSCOPY N/A 08/15/2013   Procedure: COLONOSCOPY;  Surgeon: Rogene Houston, MD;  [facility];  Service: Endoscopy;  Laterality: N/A;  100   COLONOSCOPY N/A 09/20/2018   Procedure: COLONOSCOPY;  Surgeon: Rogene Houston, MD;  [facility];  Service: Endoscopy;  Laterality: N/A;  12:00pm   ERCP  03/02/05  [facility], [facility]. Normal-appearing biliary tree (gallbladder not image), status post sphincterotomy with recovery of small pieces of stone material, status post balloon occlusion cholangiogram.   ERCP  01/07/2012   Procedure: ENDOSCOPIC RETROGRADE CHOLANGIOPANCREATOGRAPHY (ERCP);  Surgeon: Rogene Houston, MD;  Location: [facility];  Service: Endoscopy;  Laterality: N/A;  possible biliary stenting   ERCP  02/21/2012   Procedure: ENDOSCOPIC RETROGRADE CHOLANGIOPANCREATOGRAPHY (ERCP);  Surgeon: Rogene Houston, MD;  Location: [facility];  Service: Endoscopy;  Laterality: N/A;  common bile duct stone  and clip removed   ERCP N/A 05/18/2012   Procedure: ENDOSCOPIC RETROGRADE CHOLANGIOPANCREATOGRAPHY (ERCP);  Surgeon: Rogene Houston, MD;  Location: [facility];  Service: Endoscopy;  Laterality: N/A;  stone and debri removal   ESOPHAGOGASTRODUODENOSCOPY  August 2005   Erosive esophagitis and Schatzki ring, small hiatal hernia   ESOPHAGOGASTRODUODENOSCOPY  01/2008   Mild reflux esophagitis, small hiatal hernia   ESOPHAGOGASTRODUODENOSCOPY  02/01/2011   Rourk-erosive reflux esophagitis, small hiatal hernia   ESOPHAGOGASTRODUODENOSCOPY  10/26/2011   Dr. Eli Phillips gastric submucosal petechia (bx-benign ulceration), juxta ampullary duodenal diverticulum and some mucosal edema involving 1st/2nd portion of duodenum with superficial erosions (bx-superficial ulceration/benign)   ESOPHAGOGASTRODUODENOSCOPY  02/21/2012   Procedure: ESOPHAGOGASTRODUODENOSCOPY (EGD);  Surgeon: Rogene Houston, MD;  Location: [facility];  Service: Endoscopy;  Laterality: N/A;   EYE SURGERY     Incomplete colonoscopy  December 2009   Left-sided diverticula, mid descending colon polyp, due to recurrent looping and redundancy exam was incomplete. It was felt that the mid colon was reached. Followup barium enema showed colon interposition between the liver and the diaphragm, redundant sigmoid colon but no colon mass or polyp identified.. Pathology revealed tubular adenoma.   POLYPECTOMY  09/20/2018   Procedure: POLYPECTOMY;  Surgeon: Rogene Houston, MD;  [facility];  Service: Endoscopy;;  Hepatic flexure polyp cold snare, splenic flexure polyp   SPHINCTEROTOMY  01/07/2012   Procedure: SPHINCTEROTOMY;  Surgeon: Rogene Houston, MD;  Location: [facility];  Service: Endoscopy;  Laterality: N/A;  Extended   SPYGLASS CHOLANGIOSCOPY  02/21/2012   Procedure: SPYGLASS CHOLANGIOSCOPY;  Surgeon: Rogene Houston, MD;  Location: [facility];  Service: Endoscopy;  Laterality: N/A;   SPYGLASS CHOLANGIOSCOPY N/A 05/18/2012   Procedure:  SPYGLASS CHOLANGIOSCOPY;  Surgeon: Rogene Houston, MD;  Location: [facility];  Service: Endoscopy;  Laterality: N/A;   TOTAL KNEE ARTHROPLASTY  03/21/2012   Procedure: TOTAL KNEE ARTHROPLASTY;  Surgeon: [name], MD;  Location: [place];  Service: Orthopedics;  Laterality: Right;  right knee arthroplasty   TOTAL KNEE ARTHROPLASTY Left 04/08/2019   Procedure: LEFT TOTAL KNEE ARTHROPLASTY;  Surgeon: Frederik Pear, MD;  Location: [facility];  Service: Orthopedics;  Laterality: Left;    Current Outpatient Medications  Medication Sig Dispense Refill   amLODipine (NORVASC) 5 MG tablet Take 5 mg by mouth at bedtime.     aspirin EC 81 MG tablet Take 81 mg by mouth daily. Swallow whole.     atorvastatin (LIPITOR) 10 MG tablet Take 10 mg by mouth daily.     losartan (COZAAR) 50 MG tablet Take 50 mg by mouth daily.     neomycin-polymyxin-hydrocortisone (CORTISPORIN) 3.5-10000-1 OTIC suspension Place 4 drops into the right ear 3 (three) times daily. (Patient not taking: Reported on 01/11/2021) 10 mL 0   nitroGLYCERIN (NITROSTAT) 0.4 MG SL tablet Place 1 tablet (0.4 mg total) under the tongue every 5 (five) minutes as needed for chest pain. (Patient not taking: Reported on 01/11/2021)  25 tablet 3   No current facility-administered medications for this visit.   Allergies:  Patient has no known allergies.   Social History: The patient  reports that he has never smoked. He has never used smokeless tobacco. He reports that he does not drink alcohol and does not use drugs.   Family History: The patient's family history includes Coronary artery disease in his father and mother; Deep vein thrombosis in his daughter; Diabetes in his father and mother; Stomach cancer in his father.   ROS: No palpitations or syncope.  Physical Exam: VS:  BP 122/62   Pulse 60   Ht 6\' 2"  (1.88 m)   Wt 257 lb (116.6 kg)   SpO2 98%   BMI 33.00 kg/m , BMI Body mass index is 33 kg/m.  Wt Readings from Last 3 Encounters:  01/11/21  257 lb (116.6 kg)  01/31/20 256 lb (116.1 kg)  12/25/19 256 lb (116.1 kg)    General: Patient appears comfortable at rest. HEENT: Conjunctiva and lids normal, wearing a mask. Neck: Supple, no elevated JVP or carotid bruits, no thyromegaly. Lungs: Clear to auscultation, nonlabored breathing at rest. Cardiac: Regular rate and rhythm, no S3, 1/6 systolic murmur, no pericardial rub. Abdomen: Soft, nontender, bowel sounds present. Extremities: No pitting edema, distal pulses 2+. Skin: Warm and dry. Musculoskeletal: No kyphosis. Neuropsychiatric: Alert and oriented x3, affect grossly appropriate.  ECG:  An ECG dated 01/22/2019 was personally reviewed today and demonstrated:  Sinus rhythm with prolonged PR interval, right bundle branch block, left anterior fascicular block, frequent PACs.  Recent Labwork:    Component Value Date/Time   CHOL 200 11/10/2015 0734   TRIG 208 (H) 11/10/2015 0734   HDL 34 (L) 11/10/2015 0734   CHOLHDL 5.9 (H) 11/10/2015 0734   VLDL 42 (H) 11/10/2015 0734   LDLCALC 124 11/10/2015 0734  May 2022: Hemoglobin A1c 5.8%, cholesterol 150, triglycerides 121, HDL 34, LDL 94, BUN 20, creatinine 1.58, potassium 4.4, AST 26, ALT 21, hemoglobin 15.5, platelets 158  Other Studies Reviewed Today:  Echocardiogram 01/23/2019:  1. Left ventricular ejection fraction, by visual estimation, is 60 to  65%. The left ventricle has normal function. There is no left ventricular  hypertrophy.   2. Elevated left ventricular end-diastolic pressure.   3. Left ventricular diastolic parameters are consistent with Grade I  diastolic dysfunction (impaired relaxation).   4. Global right ventricle has normal systolic function.The right  ventricular size is normal. No increase in right ventricular wall  thickness.   5. Left atrial size was normal.   6. Right atrial size was normal.   7. Mild aortic valve annular calcification.   8. Mild mitral annular calcification.   9. The mitral valve is  grossly normal. Trace mitral valve regurgitation.  10. The tricuspid valve is grossly normal. Tricuspid valve regurgitation  is trivial.  11. The aortic valve is tricuspid. Aortic valve regurgitation is mild.  Mild aortic valve sclerosis without stenosis.  12. There is Mild calcification of the aortic valve.  13. There is Mild thickening of the aortic valve.  14. The pulmonic valve was grossly normal. Pulmonic valve regurgitation is  not visualized.  15. Aortic dilatation noted.  16. There is mild dilatation of the aortic root.  17. Normal pulmonary artery systolic pressure.  18. The interatrial septum was not well visualized.   Lexiscan Myoview 09/06/2017: Blood pressure demonstrated a normal response to exercise. There was no ST segment deviation noted during stress. This is a low risk  study. The left ventricular ejection fraction is normal (55-65%). Moderate size mild intensity inferior defect with very mild reversibility. The normal wall motion would suggest possible subdiaphragmatic attenuation, however cannot exclude prior infarct with very mild per-infarct ischemia. Either findings would support low risk.  Assessment and Plan:  1.  Progressive dyspnea on exertion concerning for accelerating angina in an 80 year old male with medically managed ischemic heart disease based on previous abnormal Myoview in 2019 indicating inferior wall scar with mild peri-infarct ischemia.  Symptoms have been worse over the last month in the setting of stable medical therapy including aspirin, Norvasc, losartan, and Lipitor.  He is not on beta-blocker with conduction disease at baseline.  We discussed diagnostic cardiac catheterization for evaluation of coronary anatomy and review of potential revascularization options.  Risk and benefits reviewed and he is in agreement to proceed.  Does have risk of contrast nephropathy with CKD stage IIIb, we will obtain follow-up lab work.  Anticipate holding losartan and  having him come in early for IV fluids on the day of the procedure.  Would not obtain ventriculogram.  2.  Mixed hyperlipidemia, he remains on Lipitor.  Last LDL was at 94.  3.  Frequent PVCs by history, no syncope, and LVEF normal range over time.  4.  CKD stage IIIb, last creatinine 1.58, requesting interval lab work from PCP.  Medication Adjustments/Labs and Tests Ordered: Current medicines are reviewed at length with the patient today.  Concerns regarding medicines are outlined above.   Tests Ordered: Orders Placed This Encounter  Procedures   Basic metabolic panel   CBC   EKG 12-Lead    Medication Changes: No orders of the defined types were placed in this encounter.   Disposition:  Follow up  after procedure.  Signed, Satira Sark, MD, [facility] 01/11/2021 11:52 AM    [facility], [place], [name] [address] Phone: [phone]; Fax: [phone]

## 2021-01-10 NOTE — H&P (View-Only) (Signed)
Cardiology Office Note  Date: 01/11/2021   ID: [name], DOB 1940/11/14, MRN [id number]  PCP:  Celene Squibb, MD  Cardiologist:  Rozann Lesches, MD Electrophysiologist:  None   Chief Complaint  Patient presents with   Cardiac follow-up    History of Present Illness: [name] is an 80 y.o. male last seen in July 2021.  He is referred back to the office by Dr. Nevada Crane due to recent exertional symptoms.  He states that over the last month he has been more short of breath with activity, NYHA class II-III.  No definite chest pain but a sense of fatigue as well.  He has a history of medically managed ischemic heart disease based on previous Myoview in 2019 showing possible inferior scar with mild peri-infarct ischemia.  I personally reviewed his ECG today which shows sinus bradycardia with prolonged PR interval, right bundle branch block, and left anterior fascicular block.  He is not on beta-blocker therapy.  Current regimen includes aspirin, Norvasc, losartan, and Lipitor.  He also has CKD stage IIIb, creatinine was 1.58 in May with GFR 44.  We are requesting his most recent lab work from Dr. Nevada Crane.  He did undergo a recent chest x-ray and VQ scan on November 2 for investigation of his shortness of breath and also reportedly an abnormal D-dimer level.  These studies were both reassuring.  Past Medical History:  Diagnosis Date   Arthritis    Chronic back pain    CKD (chronic kidney disease) stage 3, GFR 30-59 ml/min (HCC)    Essential hypertension    GERD (gastroesophageal reflux disease)    Helicobacter pylori gastritis    Treated   Hx of adenomatous colonic polyps    IBS (irritable bowel syndrome)    Prostate CA (HCC)    Seed implants   Renal insufficiency    Schatzki's ring     Past Surgical History:  Procedure Laterality Date   APPENDECTOMY  age 66   BACK SURGERY  02/08   [facility]   BACK SURGERY  09/24/04   lumbar, [facility]   BACK SURGERY  09/18/02   [facility]   BILIARY  STENT PLACEMENT  01/07/2012   Procedure: BILIARY STENT PLACEMENT;  Surgeon: Rogene Houston, MD;  Location: [facility];  Service: Endoscopy;  Laterality: N/A;   BILIARY STENT PLACEMENT  02/21/2012   Procedure: BILIARY STENT PLACEMENT;  Surgeon: Rogene Houston, MD;  Location: [facility];  Service: Endoscopy;  Laterality: N/A;  Biliary Stent Replacement   CATARACT EXTRACTION W/PHACO  11/22/2010   Procedure: CATARACT EXTRACTION PHACO AND INTRAOCULAR LENS PLACEMENT ([place]);  Surgeon: Williams Che;  Location: [facility];  Service: Ophthalmology;  Laterality: Right;  CDE: 6.81   CHOLECYSTECTOMY  03/04/05   [facility], [facility]. Gangrenous cholecystitis complicated by abscess requiring percutaneous drainage. He also had common duct stones requiring ERCP and sphincterotomy.   COLONOSCOPY  August 2005   Scattered sigmoid diverticulosis, splenic flexure polyp. Hyperplastic   COLONOSCOPY  1997   3 cm tubular adenoma and a sending colon   COLONOSCOPY  02/01/2011   Rourk-friable anal canal, hyperplastic rectal polyp   COLONOSCOPY N/A 08/15/2013   Procedure: COLONOSCOPY;  Surgeon: Rogene Houston, MD;  [facility];  Service: Endoscopy;  Laterality: N/A;  100   COLONOSCOPY N/A 09/20/2018   Procedure: COLONOSCOPY;  Surgeon: Rogene Houston, MD;  [facility];  Service: Endoscopy;  Laterality: N/A;  12:00pm   ERCP  03/02/05  [facility], [facility]. Normal-appearing biliary tree (gallbladder not image), status post sphincterotomy with recovery of small pieces of stone material, status post balloon occlusion cholangiogram.   ERCP  01/07/2012   Procedure: ENDOSCOPIC RETROGRADE CHOLANGIOPANCREATOGRAPHY (ERCP);  Surgeon: Rogene Houston, MD;  Location: [facility];  Service: Endoscopy;  Laterality: N/A;  possible biliary stenting   ERCP  02/21/2012   Procedure: ENDOSCOPIC RETROGRADE CHOLANGIOPANCREATOGRAPHY (ERCP);  Surgeon: Rogene Houston, MD;  Location: [facility];  Service: Endoscopy;  Laterality: N/A;  common bile duct stone  and clip removed   ERCP N/A 05/18/2012   Procedure: ENDOSCOPIC RETROGRADE CHOLANGIOPANCREATOGRAPHY (ERCP);  Surgeon: Rogene Houston, MD;  Location: [facility];  Service: Endoscopy;  Laterality: N/A;  stone and debri removal   ESOPHAGOGASTRODUODENOSCOPY  August 2005   Erosive esophagitis and Schatzki ring, small hiatal hernia   ESOPHAGOGASTRODUODENOSCOPY  01/2008   Mild reflux esophagitis, small hiatal hernia   ESOPHAGOGASTRODUODENOSCOPY  02/01/2011   Rourk-erosive reflux esophagitis, small hiatal hernia   ESOPHAGOGASTRODUODENOSCOPY  10/26/2011   Dr. Eli Phillips gastric submucosal petechia (bx-benign ulceration), juxta ampullary duodenal diverticulum and some mucosal edema involving 1st/2nd portion of duodenum with superficial erosions (bx-superficial ulceration/benign)   ESOPHAGOGASTRODUODENOSCOPY  02/21/2012   Procedure: ESOPHAGOGASTRODUODENOSCOPY (EGD);  Surgeon: Rogene Houston, MD;  Location: [facility];  Service: Endoscopy;  Laterality: N/A;   EYE SURGERY     Incomplete colonoscopy  December 2009   Left-sided diverticula, mid descending colon polyp, due to recurrent looping and redundancy exam was incomplete. It was felt that the mid colon was reached. Followup barium enema showed colon interposition between the liver and the diaphragm, redundant sigmoid colon but no colon mass or polyp identified.. Pathology revealed tubular adenoma.   POLYPECTOMY  09/20/2018   Procedure: POLYPECTOMY;  Surgeon: Rogene Houston, MD;  [facility];  Service: Endoscopy;;  Hepatic flexure polyp cold snare, splenic flexure polyp   SPHINCTEROTOMY  01/07/2012   Procedure: SPHINCTEROTOMY;  Surgeon: Rogene Houston, MD;  Location: [facility];  Service: Endoscopy;  Laterality: N/A;  Extended   SPYGLASS CHOLANGIOSCOPY  02/21/2012   Procedure: SPYGLASS CHOLANGIOSCOPY;  Surgeon: Rogene Houston, MD;  Location: [facility];  Service: Endoscopy;  Laterality: N/A;   SPYGLASS CHOLANGIOSCOPY N/A 05/18/2012   Procedure:  SPYGLASS CHOLANGIOSCOPY;  Surgeon: Rogene Houston, MD;  Location: [facility];  Service: Endoscopy;  Laterality: N/A;   TOTAL KNEE ARTHROPLASTY  03/21/2012   Procedure: TOTAL KNEE ARTHROPLASTY;  Surgeon: [name], MD;  Location: [place];  Service: Orthopedics;  Laterality: Right;  right knee arthroplasty   TOTAL KNEE ARTHROPLASTY Left 04/08/2019   Procedure: LEFT TOTAL KNEE ARTHROPLASTY;  Surgeon: Frederik Pear, MD;  Location: [facility];  Service: Orthopedics;  Laterality: Left;    Current Outpatient Medications  Medication Sig Dispense Refill   amLODipine (NORVASC) 5 MG tablet Take 5 mg by mouth at bedtime.     aspirin EC 81 MG tablet Take 81 mg by mouth daily. Swallow whole.     atorvastatin (LIPITOR) 10 MG tablet Take 10 mg by mouth daily.     losartan (COZAAR) 50 MG tablet Take 50 mg by mouth daily.     neomycin-polymyxin-hydrocortisone (CORTISPORIN) 3.5-10000-1 OTIC suspension Place 4 drops into the right ear 3 (three) times daily. (Patient not taking: Reported on 01/11/2021) 10 mL 0   nitroGLYCERIN (NITROSTAT) 0.4 MG SL tablet Place 1 tablet (0.4 mg total) under the tongue every 5 (five) minutes as needed for chest pain. (Patient not taking: Reported on 01/11/2021)  25 tablet 3   No current facility-administered medications for this visit.   Allergies:  Patient has no known allergies.   Social History: The patient  reports that he has never smoked. He has never used smokeless tobacco. He reports that he does not drink alcohol and does not use drugs.   Family History: The patient's family history includes Coronary artery disease in his father and mother; Deep vein thrombosis in his daughter; Diabetes in his father and mother; Stomach cancer in his father.   ROS: No palpitations or syncope.  Physical Exam: VS:  BP 122/62   Pulse 60   Ht 6\' 2"  (1.88 m)   Wt 257 lb (116.6 kg)   SpO2 98%   BMI 33.00 kg/m , BMI Body mass index is 33 kg/m.  Wt Readings from Last 3 Encounters:  01/11/21  257 lb (116.6 kg)  01/31/20 256 lb (116.1 kg)  12/25/19 256 lb (116.1 kg)    General: Patient appears comfortable at rest. HEENT: Conjunctiva and lids normal, wearing a mask. Neck: Supple, no elevated JVP or carotid bruits, no thyromegaly. Lungs: Clear to auscultation, nonlabored breathing at rest. Cardiac: Regular rate and rhythm, no S3, 1/6 systolic murmur, no pericardial rub. Abdomen: Soft, nontender, bowel sounds present. Extremities: No pitting edema, distal pulses 2+. Skin: Warm and dry. Musculoskeletal: No kyphosis. Neuropsychiatric: Alert and oriented x3, affect grossly appropriate.  ECG:  An ECG dated 01/22/2019 was personally reviewed today and demonstrated:  Sinus rhythm with prolonged PR interval, right bundle branch block, left anterior fascicular block, frequent PACs.  Recent Labwork:    Component Value Date/Time   CHOL 200 11/10/2015 0734   TRIG 208 (H) 11/10/2015 0734   HDL 34 (L) 11/10/2015 0734   CHOLHDL 5.9 (H) 11/10/2015 0734   VLDL 42 (H) 11/10/2015 0734   LDLCALC 124 11/10/2015 0734  May 2022: Hemoglobin A1c 5.8%, cholesterol 150, triglycerides 121, HDL 34, LDL 94, BUN 20, creatinine 1.58, potassium 4.4, AST 26, ALT 21, hemoglobin 15.5, platelets 158  Other Studies Reviewed Today:  Echocardiogram 01/23/2019:  1. Left ventricular ejection fraction, by visual estimation, is 60 to  65%. The left ventricle has normal function. There is no left ventricular  hypertrophy.   2. Elevated left ventricular end-diastolic pressure.   3. Left ventricular diastolic parameters are consistent with Grade I  diastolic dysfunction (impaired relaxation).   4. Global right ventricle has normal systolic function.The right  ventricular size is normal. No increase in right ventricular wall  thickness.   5. Left atrial size was normal.   6. Right atrial size was normal.   7. Mild aortic valve annular calcification.   8. Mild mitral annular calcification.   9. The mitral valve is  grossly normal. Trace mitral valve regurgitation.  10. The tricuspid valve is grossly normal. Tricuspid valve regurgitation  is trivial.  11. The aortic valve is tricuspid. Aortic valve regurgitation is mild.  Mild aortic valve sclerosis without stenosis.  12. There is Mild calcification of the aortic valve.  13. There is Mild thickening of the aortic valve.  14. The pulmonic valve was grossly normal. Pulmonic valve regurgitation is  not visualized.  15. Aortic dilatation noted.  16. There is mild dilatation of the aortic root.  17. Normal pulmonary artery systolic pressure.  18. The interatrial septum was not well visualized.   Lexiscan Myoview 09/06/2017: Blood pressure demonstrated a normal response to exercise. There was no ST segment deviation noted during stress. This is a low risk  study. The left ventricular ejection fraction is normal (55-65%). Moderate size mild intensity inferior defect with very mild reversibility. The normal wall motion would suggest possible subdiaphragmatic attenuation, however cannot exclude prior infarct with very mild per-infarct ischemia. Either findings would support low risk.  Assessment and Plan:  1.  Progressive dyspnea on exertion concerning for accelerating angina in an 80 year old male with medically managed ischemic heart disease based on previous abnormal Myoview in 2019 indicating inferior wall scar with mild peri-infarct ischemia.  Symptoms have been worse over the last month in the setting of stable medical therapy including aspirin, Norvasc, losartan, and Lipitor.  He is not on beta-blocker with conduction disease at baseline.  We discussed diagnostic cardiac catheterization for evaluation of coronary anatomy and review of potential revascularization options.  Risk and benefits reviewed and he is in agreement to proceed.  Does have risk of contrast nephropathy with CKD stage IIIb, we will obtain follow-up lab work.  Anticipate holding losartan and  having him come in early for IV fluids on the day of the procedure.  Would not obtain ventriculogram.  2.  Mixed hyperlipidemia, he remains on Lipitor.  Last LDL was at 94.  3.  Frequent PVCs by history, no syncope, and LVEF normal range over time.  4.  CKD stage IIIb, last creatinine 1.58, requesting interval lab work from PCP.  Medication Adjustments/Labs and Tests Ordered: Current medicines are reviewed at length with the patient today.  Concerns regarding medicines are outlined above.   Tests Ordered: Orders Placed This Encounter  Procedures   Basic metabolic panel   CBC   EKG 12-Lead    Medication Changes: No orders of the defined types were placed in this encounter.   Disposition:  Follow up  after procedure.  Signed, Satira Sark, MD, [facility] 01/11/2021 11:52 AM    [facility] [place], [place], [place] [address] Phone: [phone]; Fax: [phone]

## 2021-01-11 ENCOUNTER — Other Ambulatory Visit: Payer: Self-pay | Admitting: Cardiology

## 2021-01-11 ENCOUNTER — Telehealth: Payer: Self-pay | Admitting: Cardiology

## 2021-01-11 ENCOUNTER — Ambulatory Visit (INDEPENDENT_AMBULATORY_CARE_PROVIDER_SITE_OTHER): Payer: PPO | Admitting: Cardiology

## 2021-01-11 ENCOUNTER — Encounter: Payer: Self-pay | Admitting: Cardiology

## 2021-01-11 ENCOUNTER — Encounter: Payer: Self-pay | Admitting: *Deleted

## 2021-01-11 VITALS — BP 122/62 | HR 60 | Ht 74.0 in | Wt 257.0 lb

## 2021-01-11 DIAGNOSIS — Z01818 Encounter for other preprocedural examination: Secondary | ICD-10-CM

## 2021-01-11 DIAGNOSIS — I25119 Atherosclerotic heart disease of native coronary artery with unspecified angina pectoris: Secondary | ICD-10-CM

## 2021-01-11 DIAGNOSIS — I200 Unstable angina: Secondary | ICD-10-CM | POA: Diagnosis not present

## 2021-01-11 DIAGNOSIS — Z01810 Encounter for preprocedural cardiovascular examination: Secondary | ICD-10-CM | POA: Diagnosis not present

## 2021-01-11 MED ORDER — SODIUM CHLORIDE 0.9% FLUSH: 3.0000 mL | Freq: Two times a day (BID) | INTRAVENOUS | Status: DC

## 2021-01-11 NOTE — Patient Instructions (Signed)
Medication Instructions:  Your physician recommends that you continue on your current medications as directed. Please refer to the Current Medication list given to you today.  Labwork: BMET & CBC today at [facility] This may be done at [facility] ([place]) Monday-Friday from 8:00 am - 4:00 pm. No appointment is needed.  Testing/Procedures: Your physician has requested that you have a cardiac catheterization. Cardiac catheterization is used to diagnose and/or treat various heart conditions. Doctors may recommend this procedure for a number of different reasons. The most common reason is to evaluate chest pain. Chest pain can be a symptom of coronary artery disease (CAD), and cardiac catheterization can show whether plaque is narrowing or blocking your heart's arteries. This procedure is also used to evaluate the valves, as well as measure the blood flow and oxygen levels in different parts of your heart. For further information please visit [email or web address]. Please follow instruction sheet, as given.  Follow-Up: Your physician recommends that you schedule a follow-up appointment in: 1 month  Any Other Special Instructions Will Be Listed Below (If Applicable).  If you need a refill on your cardiac medications before your next appointment, please call your pharmacy.   [place] [address] Dept: [phone] Loc: [place]  01/11/2021  You are scheduled for a Cardiac Catheterization on Monday, November 28 with Dr. Quay Burow.  1. Please arrive at the [facility] (Main Entrance A) at [facility]: [address] [place], [place] [address] at 6:30 AM (This time is two hours before your procedure to ensure your preparation). Free valet parking service is available.   Special note: Every effort is made to have your  procedure done on time. Please understand that emergencies sometimes delay scheduled procedures.  2. Diet: Do not eat solid foods after midnight.  The patient may have clear liquids until 5am upon the day of the procedure.  3. Labs: You will need to have blood drawn on Monday, November 21 at [place]. [address], [place]  Open: 7am - 6pm, Sat 8am - 12 noon   Phone: [phone]. You do not need to be fasting.  4. Medication instructions in preparation for your procedure:   Contrast Allergy: No   Stop taking, Cozaar (Losartan) Sunday, November 27,   On the morning of your procedure, take your Aspirin 81 mg and any morning medicines NOT listed above.  You may use sips of water.  5. Plan for one night stay--bring personal belongings. 6. Bring a current list of your medications and current insurance cards. 7. You MUST have a responsible person to drive you home. 8. Someone MUST be with you the first 24 hours after you arrive home or your discharge will be delayed. 9. Please wear clothes that are easy to get on and off and wear slip-on shoes.  Thank you for allowing [place] to care for you!   -- [facility]

## 2021-01-11 NOTE — Telephone Encounter (Signed)
PERCERT:  Left heart cath dx: accelerating angina Monday, 11/28/222 @11 :30 am with Dr. Gwenlyn Found (w/hydration protocol)

## 2021-01-12 ENCOUNTER — Telehealth: Payer: Self-pay | Admitting: *Deleted

## 2021-01-12 LAB — BASIC METABOLIC PANEL
BUN/Creatinine Ratio: 18 (calc) (ref 6–22)
BUN: 30 mg/dL — ABNORMAL HIGH (ref 7–25)
CO2: 30 mmol/L (ref 20–32)
Calcium: 9.4 mg/dL (ref 8.6–10.3)
Chloride: 99 mmol/L (ref 98–110)
Creat: 1.69 mg/dL — ABNORMAL HIGH (ref 0.70–1.22)
Glucose, Bld: 96 mg/dL (ref 65–99)
Potassium: 4.3 mmol/L (ref 3.5–5.3)
Sodium: 137 mmol/L (ref 135–146)

## 2021-01-12 LAB — BASIC METABOLIC PANEL WITH GFR
BUN/Creatinine Ratio: 18 (calc) (ref 6–22)
BUN: 30 mg/dL — ABNORMAL HIGH (ref 7–25)
CO2: 30 mmol/L (ref 20–32)
Calcium: 9.4 mg/dL (ref 8.6–10.3)
Chloride: 99 mmol/L (ref 98–110)
Creat: 1.69 mg/dL — ABNORMAL HIGH (ref 0.70–1.22)
Glucose, Bld: 96 mg/dL (ref 65–99)
Potassium: 4.3 mmol/L (ref 3.5–5.3)
Sodium: 137 mmol/L (ref 135–146)
eGFR: 40 mL/min/{1.73_m2} — ABNORMAL LOW (ref >=60)

## 2021-01-12 LAB — CBC
HCT: 43 % (ref 38.5–50.0)
Hemoglobin: 14.5 g/dL (ref 13.2–17.1)
MCH: 31.9 pg (ref 27.0–33.0)
MCHC: 33.7 g/dL (ref 32.0–36.0)
MCV: 94.5 fL (ref 80.0–100.0)
MPV: 9.2 fL (ref 7.5–12.5)
Platelets: 167 10*3/uL (ref 140–400)
RBC: 4.55 10*6/uL (ref 4.20–5.80)
RDW: 12.1 % (ref 11.0–15.0)
WBC: 4.5 10*3/uL (ref 3.8–10.8)

## 2021-01-12 LAB — TEST AUTHORIZATION

## 2021-01-12 NOTE — Telephone Encounter (Signed)
Patient informed. Copy sent to PCP °

## 2021-01-12 NOTE — Telephone Encounter (Signed)
-----   Message from Satira Sark, MD sent at 01/12/2021  8:05 AM EST ----- Results reviewed.  Creatinine looks to be relatively stable at 1.69, potassium normal.  Hemoglobin also normal.  As mentioned in office note, anticipate him coming in for hydration prior to his cardiac catheterization, also hold losartan.

## 2021-01-18 ENCOUNTER — Encounter (HOSPITAL_COMMUNITY)
Admission: RE | Disposition: A | Discharge status: Home / Self Care | Payer: Self-pay | Source: Ambulatory Visit | Attending: Cardiovascular Disease

## 2021-01-18 ENCOUNTER — Other Ambulatory Visit: Payer: Self-pay | Admitting: Cardiology

## 2021-01-18 ENCOUNTER — Other Ambulatory Visit: Payer: Self-pay

## 2021-01-18 ENCOUNTER — Ambulatory Visit (HOSPITAL_COMMUNITY)
Admission: RE | Admit: 2021-01-18 | Discharge: 2021-01-18 | Disposition: A | Discharge status: Home / Self Care | Payer: PPO | Source: Ambulatory Visit | Attending: Cardiovascular Disease | Admitting: Cardiovascular Disease

## 2021-01-18 ENCOUNTER — Encounter (HOSPITAL_COMMUNITY): Payer: Self-pay | Admitting: Cardiovascular Disease

## 2021-01-18 DIAGNOSIS — I129 Hypertensive chronic kidney disease with stage 1 through stage 4 chronic kidney disease, or unspecified chronic kidney disease: Secondary | ICD-10-CM | POA: Insufficient documentation

## 2021-01-18 DIAGNOSIS — R0609 Other forms of dyspnea: Secondary | ICD-10-CM | POA: Diagnosis not present

## 2021-01-18 DIAGNOSIS — Z79899 Other long term (current) drug therapy: Secondary | ICD-10-CM | POA: Diagnosis not present

## 2021-01-18 DIAGNOSIS — N1832 Chronic kidney disease, stage 3b: Secondary | ICD-10-CM | POA: Insufficient documentation

## 2021-01-18 DIAGNOSIS — I5031 Acute diastolic (congestive) heart failure: Secondary | ICD-10-CM | POA: Diagnosis not present

## 2021-01-18 DIAGNOSIS — E782 Mixed hyperlipidemia: Secondary | ICD-10-CM | POA: Diagnosis not present

## 2021-01-18 DIAGNOSIS — R079 Chest pain, unspecified: Secondary | ICD-10-CM

## 2021-01-18 DIAGNOSIS — I2510 Atherosclerotic heart disease of native coronary artery without angina pectoris: Secondary | ICD-10-CM | POA: Diagnosis not present

## 2021-01-18 DIAGNOSIS — I25118 Atherosclerotic heart disease of native coronary artery with other forms of angina pectoris: Secondary | ICD-10-CM | POA: Insufficient documentation

## 2021-01-18 DIAGNOSIS — I200 Unstable angina: Secondary | ICD-10-CM

## 2021-01-18 HISTORY — PX: LEFT HEART CATH AND CORONARY ANGIOGRAPHY: CATH118249

## 2021-01-18 SURGERY — LEFT HEART CATH AND CORONARY ANGIOGRAPHY
Anesthesia: LOCAL

## 2021-01-18 MED ORDER — ATORVASTATIN CALCIUM 80 MG PO TABS: 80.0000 mg | ORAL_TABLET | Freq: Every day | ORAL | Status: DC

## 2021-01-18 MED ORDER — ACETAMINOPHEN 325 MG PO TABS: 650.0000 mg | ORAL_TABLET | ORAL | Status: DC | PRN

## 2021-01-18 MED ORDER — HEPARIN (PORCINE) IN NACL 1000-0.9 UT/500ML-% IV SOLN
INTRAVENOUS | Status: DC | PRN
  Administered 2021-01-18 (×3): 500 mL

## 2021-01-18 MED ORDER — SODIUM CHLORIDE 0.9% FLUSH: 3.0000 mL | INTRAVENOUS | Status: DC | PRN

## 2021-01-18 MED ORDER — HEPARIN SODIUM (PORCINE) 1000 UNIT/ML IJ SOLN
INTRAMUSCULAR | Status: AC
  Filled 2021-01-18: qty 10

## 2021-01-18 MED ORDER — HEPARIN (PORCINE) IN NACL 1000-0.9 UT/500ML-% IV SOLN
INTRAVENOUS | Status: AC
  Filled 2021-01-18: qty 500

## 2021-01-18 MED ORDER — HEPARIN SODIUM (PORCINE) 1000 UNIT/ML IJ SOLN
INTRAMUSCULAR | Status: DC | PRN
  Administered 2021-01-18: 5500 [IU] via INTRAVENOUS

## 2021-01-18 MED ORDER — VERAPAMIL HCL 2.5 MG/ML IV SOLN
INTRAVENOUS | Status: AC
  Filled 2021-01-18: qty 2

## 2021-01-18 MED ORDER — ONDANSETRON HCL 4 MG/2ML IJ SOLN: 4.0000 mg | Freq: Four times a day (QID) | INTRAMUSCULAR | Status: DC | PRN

## 2021-01-18 MED ORDER — MORPHINE SULFATE (PF) 2 MG/ML IV SOLN: 2.0000 mg | INTRAVENOUS | Status: DC | PRN

## 2021-01-18 MED ORDER — SODIUM CHLORIDE 0.9 % WEIGHT BASED INFUSION
3.0000 mL/kg/h | INTRAVENOUS | Status: AC
  Administered 2021-01-18: 07:00:00 3 mL/kg/h via INTRAVENOUS

## 2021-01-18 MED ORDER — NITROGLYCERIN 1 MG/10 ML FOR IR/CATH LAB
INTRA_ARTERIAL | Status: AC
  Filled 2021-01-18: qty 10

## 2021-01-18 MED ORDER — SODIUM CHLORIDE 0.9 % WEIGHT BASED INFUSION: 1.0000 mL/kg/h | INTRAVENOUS | Status: DC

## 2021-01-18 MED ORDER — HYDRALAZINE HCL 20 MG/ML IJ SOLN: 10.0000 mg | INTRAMUSCULAR | Status: DC | PRN

## 2021-01-18 MED ORDER — SODIUM CHLORIDE 0.9% FLUSH: 3.0000 mL | Freq: Two times a day (BID) | INTRAVENOUS | Status: DC

## 2021-01-18 MED ORDER — SODIUM CHLORIDE 0.9 % IV SOLN: 250.0000 mL | INTRAVENOUS | Status: DC | PRN

## 2021-01-18 MED ORDER — ASPIRIN 81 MG PO CHEW: 81.0000 mg | CHEWABLE_TABLET | Freq: Every day | ORAL | Status: DC

## 2021-01-18 MED ORDER — ASPIRIN 81 MG PO CHEW: 81.0000 mg | CHEWABLE_TABLET | ORAL | Status: DC

## 2021-01-18 MED ORDER — IOHEXOL 350 MG/ML SOLN
INTRAVENOUS | Status: DC | PRN
  Administered 2021-01-18: 11:00:00 55 mL via INTRA_ARTERIAL

## 2021-01-18 MED ORDER — VERAPAMIL HCL 2.5 MG/ML IV SOLN
INTRA_ARTERIAL | Status: DC | PRN
  Administered 2021-01-18: 11:00:00 8 mL via INTRA_ARTERIAL

## 2021-01-18 MED ORDER — LIDOCAINE HCL (PF) 1 % IJ SOLN
INTRAMUSCULAR | Status: AC
  Filled 2021-01-18: qty 30

## 2021-01-18 MED ORDER — LIDOCAINE HCL (PF) 1 % IJ SOLN
INTRAMUSCULAR | Status: DC | PRN
  Administered 2021-01-18: 2 mL via INTRADERMAL

## 2021-01-18 MED ORDER — LABETALOL HCL 5 MG/ML IV SOLN: 10.0000 mg | INTRAVENOUS | Status: DC | PRN

## 2021-01-18 MED ORDER — SODIUM CHLORIDE 0.9 % IV SOLN: INTRAVENOUS | Status: DC

## 2021-01-18 SURGICAL SUPPLY — 15 items
CATH INFINITI 5 FR 3DRC (CATHETERS) ×1 IMPLANT
CATH INFINITI 5FR ANG PIGTAIL (CATHETERS) ×1 IMPLANT
CATH INFINITI JR4 5F (CATHETERS) ×1 IMPLANT
CATH OPTITORQUE TIG 4.0 5F (CATHETERS) ×1 IMPLANT
DEVICE RAD COMP TR BAND LRG (VASCULAR PRODUCTS) ×1 IMPLANT
GLIDESHEATH SLEND A-KIT 6F 22G (SHEATH) ×1 IMPLANT
GUIDEWIRE INQWIRE 1.5J.035X260 (WIRE) IMPLANT
INQWIRE 1.5J .035X260CM (WIRE) ×2
KIT HEART LEFT (KITS) ×3 IMPLANT
PACK CARDIAC CATHETERIZATION (CUSTOM PROCEDURE TRAY) ×3 IMPLANT
SHEATH 6FR 85 DEST SLENDER (SHEATH) ×1 IMPLANT
SYR MEDRAD MARK 7 150ML (SYRINGE) ×3 IMPLANT
TRANSDUCER W/STOPCOCK (MISCELLANEOUS) ×3 IMPLANT
TUBING CIL FLEX 10 FLL-RA (TUBING) ×3 IMPLANT
WIRE HI TORQ VERSACORE-J 145CM (WIRE) ×1 IMPLANT

## 2021-01-18 NOTE — Progress Notes (Signed)
Orders placed for pre op echo for TCTS consult for CABG.

## 2021-01-18 NOTE — Interval H&P Note (Signed)
Cath Lab Visit (complete for each Cath Lab visit)  Clinical Evaluation Leading to the Procedure:   ACS: No.  Non-ACS:    Anginal Classification: No Symptoms  Anti-ischemic medical therapy: Minimal Therapy (1 class of medications)  Non-Invasive Test Results: No non-invasive testing performed  Prior CABG: No previous CABG      History and Physical Interval Note:  01/18/2021 10:24 AM  [name]  has presented today for surgery, with the diagnosis of angina.  The various methods of treatment have been discussed with the patient and family. After consideration of risks, benefits and other options for treatment, the patient has consented to  Procedure(s): LEFT HEART CATH AND CORONARY ANGIOGRAPHY (N/A) as a surgical intervention.  The patient's history has been reviewed, patient examined, no change in status, stable for surgery.  I have reviewed the patient's chart and labs.  Questions were answered to the patient's satisfaction.     Quay Burow

## 2021-01-19 DIAGNOSIS — R7303 Prediabetes: Secondary | ICD-10-CM | POA: Diagnosis not present

## 2021-01-20 DIAGNOSIS — Z0001 Encounter for general adult medical examination with abnormal findings: Secondary | ICD-10-CM | POA: Diagnosis not present

## 2021-01-20 DIAGNOSIS — I129 Hypertensive chronic kidney disease with stage 1 through stage 4 chronic kidney disease, or unspecified chronic kidney disease: Secondary | ICD-10-CM | POA: Diagnosis not present

## 2021-01-20 DIAGNOSIS — N1832 Chronic kidney disease, stage 3b: Secondary | ICD-10-CM | POA: Diagnosis not present

## 2021-01-20 DIAGNOSIS — M25512 Pain in left shoulder: Secondary | ICD-10-CM | POA: Diagnosis not present

## 2021-01-20 DIAGNOSIS — M179 Osteoarthritis of knee, unspecified: Secondary | ICD-10-CM | POA: Diagnosis not present

## 2021-01-20 DIAGNOSIS — Z8546 Personal history of malignant neoplasm of prostate: Secondary | ICD-10-CM | POA: Diagnosis not present

## 2021-01-20 DIAGNOSIS — R7303 Prediabetes: Secondary | ICD-10-CM | POA: Diagnosis not present

## 2021-01-20 DIAGNOSIS — E782 Mixed hyperlipidemia: Secondary | ICD-10-CM | POA: Diagnosis not present

## 2021-01-20 DIAGNOSIS — I2510 Atherosclerotic heart disease of native coronary artery without angina pectoris: Secondary | ICD-10-CM | POA: Diagnosis not present

## 2021-01-20 DIAGNOSIS — M79645 Pain in left finger(s): Secondary | ICD-10-CM | POA: Diagnosis not present

## 2021-01-21 ENCOUNTER — Other Ambulatory Visit: Payer: PPO

## 2021-01-21 ENCOUNTER — Ambulatory Visit (HOSPITAL_COMMUNITY): Payer: PPO

## 2021-01-26 ENCOUNTER — Other Ambulatory Visit: Payer: Self-pay

## 2021-01-26 ENCOUNTER — Ambulatory Visit (HOSPITAL_COMMUNITY)
Admission: RE | Admit: 2021-01-26 | Discharge: 2021-01-26 | Disposition: A | Discharge status: Home / Self Care | Payer: PPO | Source: Ambulatory Visit | Attending: Urology | Admitting: Urology

## 2021-01-26 DIAGNOSIS — R590 Localized enlarged lymph nodes: Secondary | ICD-10-CM | POA: Diagnosis not present

## 2021-01-26 DIAGNOSIS — K5790 Diverticulosis of intestine, part unspecified, without perforation or abscess without bleeding: Secondary | ICD-10-CM | POA: Diagnosis not present

## 2021-01-26 DIAGNOSIS — M25561 Pain in right knee: Secondary | ICD-10-CM | POA: Diagnosis not present

## 2021-01-26 DIAGNOSIS — M25562 Pain in left knee: Secondary | ICD-10-CM | POA: Diagnosis not present

## 2021-01-26 DIAGNOSIS — R599 Enlarged lymph nodes, unspecified: Secondary | ICD-10-CM | POA: Insufficient documentation

## 2021-01-26 DIAGNOSIS — K4020 Bilateral inguinal hernia, without obstruction or gangrene, not specified as recurrent: Secondary | ICD-10-CM | POA: Diagnosis not present

## 2021-01-26 DIAGNOSIS — I700 Atherosclerosis of aorta: Secondary | ICD-10-CM | POA: Diagnosis not present

## 2021-01-26 MED ORDER — IOHEXOL 300 MG/ML  SOLN
100.0000 mL | Freq: Once | INTRAMUSCULAR | Status: AC | PRN
  Administered 2021-01-26: 75 mL via INTRAVENOUS

## 2021-01-28 ENCOUNTER — Ambulatory Visit: Payer: PPO | Admitting: Urology

## 2021-02-04 ENCOUNTER — Other Ambulatory Visit: Payer: Self-pay

## 2021-02-04 ENCOUNTER — Ambulatory Visit (HOSPITAL_COMMUNITY): Payer: PPO | Attending: Cardiology

## 2021-02-04 DIAGNOSIS — R079 Chest pain, unspecified: Secondary | ICD-10-CM

## 2021-02-04 LAB — ECHOCARDIOGRAM COMPLETE
Area-P 1/2: 2.76 cm2
S' Lateral: 3 cm

## 2021-02-04 NOTE — Progress Notes (Signed)
[address]       [place] [address]             [place] Record #[id number] Date of Birth: March 01, 1940  Referring: Lorretta Harp, MD Primary Care: Celene Squibb, MD Primary Cardiologist:Samuel Domenic Polite, MD  Chief Complaint:    Chief Complaint  Patient presents with   Coronary Artery Disease    Surgical consult, Cardiac Cath 01/18/21, ECHO 12/15./22    History of Present Illness:     80 year old male referred for surgical evaluation of two-vessel coronary artery disease.  The patient states that he is not having any chest pain or shortness of breath with exertion.  He remains very active.  He is able to climb ladders and do what ever he wants at his house.  He is curious as to why surgery is being considered for him.   Past Medical and Surgical History: Previous Chest Surgery: No Previous Chest Radiation: No Diabetes Mellitus: No.  HbA1C pending Creatinine: 1.68  Past Medical History:  Diagnosis Date   Arthritis    Chronic back pain    CKD (chronic kidney disease) stage 3, GFR 30-59 ml/min (HCC)    Essential hypertension    GERD (gastroesophageal reflux disease)    Helicobacter pylori gastritis    Treated   Hx of adenomatous colonic polyps    IBS (irritable bowel syndrome)    Prostate CA (HCC)    Seed implants   Renal insufficiency    Schatzki's ring     Past Surgical History:  Procedure Laterality Date   APPENDECTOMY  age 33   BACK SURGERY  02/08   [facility]   BACK SURGERY  09/24/04   lumbar, [facility]   BACK SURGERY  09/18/02   [facility]   BILIARY STENT PLACEMENT  01/07/2012   Procedure: BILIARY STENT PLACEMENT;  Surgeon: Rogene Houston, MD;  [facility];  Service: Endoscopy;  Laterality: N/A;   BILIARY STENT PLACEMENT  02/21/2012   Procedure: BILIARY STENT PLACEMENT;  Surgeon: Rogene Houston, MD;  [facility]: [facility];  Service: Endoscopy;  Laterality: N/A;  Biliary Stent Replacement   CATARACT  EXTRACTION W/PHACO  11/22/2010   Procedure: CATARACT EXTRACTION PHACO AND INTRAOCULAR LENS PLACEMENT ([place]);  Surgeon: Williams Che;  Location: [facility];  Service: Ophthalmology;  Laterality: Right;  CDE: 6.81   CHOLECYSTECTOMY  03/04/05   [facility], [facility]. Gangrenous cholecystitis complicated by abscess requiring percutaneous drainage. He also had common duct stones requiring ERCP and sphincterotomy.   COLONOSCOPY  August 2005   Scattered sigmoid diverticulosis, splenic flexure polyp. Hyperplastic   COLONOSCOPY  1997   3 cm tubular adenoma and a sending colon   COLONOSCOPY  02/01/2011   Rourk-friable anal canal, hyperplastic rectal polyp   COLONOSCOPY N/A 08/15/2013   Procedure: COLONOSCOPY;  Surgeon: Rogene Houston, MD;  [facility];  Service: Endoscopy;  Laterality: N/A;  100   COLONOSCOPY N/A 09/20/2018   Procedure: COLONOSCOPY;  Surgeon: Rogene Houston, MD;  [facility];  Service: Endoscopy;  Laterality: N/A;  12:00pm   ERCP  03/02/05   [facility], R[facility]. Normal-appearing biliary tree (gallbladder not image), status post sphincterotomy with recovery of small pieces of stone material, status post balloon occlusion cholangiogram.   ERCP  01/07/2012   Procedure: ENDOSCOPIC RETROGRADE CHOLANGIOPANCREATOGRAPHY (ERCP);  Surgeon: Rogene Houston, MD;  [facility]: [facility];  Service: Endoscopy;  Laterality: N/A;  possible biliary stenting   ERCP  02/21/2012   Procedure: ENDOSCOPIC RETROGRADE CHOLANGIOPANCREATOGRAPHY (ERCP);  Surgeon: Rogene Houston, MD;  [facility];  Service: Endoscopy;  Laterality: N/A;  common bile duct stone and clip removed   ERCP N/A 05/18/2012   Procedure: ENDOSCOPIC RETROGRADE CHOLANGIOPANCREATOGRAPHY (ERCP);  Surgeon: Rogene Houston, MD;  [facility]: [facility];  Service: Endoscopy;  Laterality: N/A;  stone and debri removal   ESOPHAGOGASTRODUODENOSCOPY  August 2005   Erosive esophagitis and Schatzki ring, small hiatal hernia   ESOPHAGOGASTRODUODENOSCOPY   01/2008   Mild reflux esophagitis, small hiatal hernia   ESOPHAGOGASTRODUODENOSCOPY  02/01/2011   Rourk-erosive reflux esophagitis, small hiatal hernia   ESOPHAGOGASTRODUODENOSCOPY  10/26/2011   Dr. Eli Phillips gastric submucosal petechia (bx-benign ulceration), juxta ampullary duodenal diverticulum and some mucosal edema involving 1st/2nd portion of duodenum with superficial erosions (bx-superficial ulceration/benign)   ESOPHAGOGASTRODUODENOSCOPY  02/21/2012   Procedure: ESOPHAGOGASTRODUODENOSCOPY (EGD);  Surgeon: Rogene Houston, MD;  [facility]: [facility];  Service: Endoscopy;  Laterality: N/A;   EYE SURGERY     Incomplete colonoscopy  December 2009   Left-sided diverticula, mid descending colon polyp, due to recurrent looping and redundancy exam was incomplete. It was felt that the mid colon was reached. Followup barium enema showed colon interposition between the liver and the diaphragm, redundant sigmoid colon but no colon mass or polyp identified.. Pathology revealed tubular adenoma.   LEFT HEART CATH AND CORONARY ANGIOGRAPHY N/A 01/18/2021   Procedure: LEFT HEART CATH AND CORONARY ANGIOGRAPHY;  Surgeon: Lorretta Harp, MD;  Location: [place] [facility];  Service: Cardiovascular;  Laterality: N/A;   POLYPECTOMY  09/20/2018   Procedure: POLYPECTOMY;  Surgeon: Rogene Houston, MD;  [facility];  Service: Endoscopy;;  Hepatic flexure polyp cold snare, splenic flexure polyp   SPHINCTEROTOMY  01/07/2012   Procedure: SPHINCTEROTOMY;  Surgeon: Rogene Houston, MD;  [facility]: [facility];  Service: Endoscopy;  Laterality: N/A;  Extended   SPYGLASS CHOLANGIOSCOPY  02/21/2012   Procedure: SPYGLASS CHOLANGIOSCOPY;  Surgeon: Rogene Houston, MD;  [facility];  Service: Endoscopy;  Laterality: N/A;   SPYGLASS CHOLANGIOSCOPY N/A 05/18/2012   Procedure: SPYGLASS CHOLANGIOSCOPY;  Surgeon: Rogene Houston, MD;  [facility]: [facility];  Service: Endoscopy;  Laterality: N/A;   TOTAL KNEE ARTHROPLASTY   03/21/2012   Procedure: TOTAL KNEE ARTHROPLASTY;  Surgeon: [name], MD;  Location: [place];  Service: Orthopedics;  Laterality: Right;  right knee arthroplasty   TOTAL KNEE ARTHROPLASTY Left 04/08/2019   Procedure: LEFT TOTAL KNEE ARTHROPLASTY;  Surgeon: Frederik Pear, MD;  [facility];  Service: Orthopedics;  Laterality: Left;    Social History: Support: Lives with his wife who is quite functional  Social History   Tobacco Use  Smoking Status Never  Smokeless Tobacco Never    Social History   Substance and Sexual Activity  Alcohol Use No   Alcohol/week: 0.0 standard drinks     No Known Allergies    Current Outpatient Medications  Medication Sig Dispense Refill   amLODipine (NORVASC) 5 MG tablet Take 5 mg by mouth at bedtime.     Ascorbic Acid (VITAMIN C) 1000 MG tablet Take 1,000 mg by mouth daily.     aspirin EC 81 MG tablet Take 81 mg by mouth daily. Swallow whole.     atorvastatin (LIPITOR) 10 MG tablet Take 10 mg by mouth daily.     cholecalciferol (VITAMIN D3) 25 MCG (1000 UNIT) tablet Take 1,000 Units  by mouth daily.     losartan (COZAAR) 50 MG tablet Take 50 mg by mouth daily.     neomycin-polymyxin-hydrocortisone (CORTISPORIN) 3.5-10000-1 OTIC suspension Place 4 drops into the right ear 3 (three) times daily. 10 mL 0   zinc gluconate 50 MG tablet Take 50 mg by mouth daily.     nitroGLYCERIN (NITROSTAT) 0.4 MG SL tablet Place 0.4 mg under the tongue every 5 (five) minutes as needed for chest pain. (Patient not taking: Reported on 02/05/2021)     Current Facility-Administered Medications  Medication Dose Route Frequency Provider Last Rate Last Admin   sodium chloride flush (NS) 0.9 % injection 3 mL  3 mL Intravenous Q12H Satira Sark, MD        (Not in a hospital admission)   Family History  Problem Relation Age of Onset   Diabetes Mother    Coronary artery disease Mother    Coronary artery disease Father    Diabetes Father    Stomach cancer  Father    Deep vein thrombosis Daughter    Hypotension Neg Hx    Anesthesia problems Neg Hx    Malignant hyperthermia Neg Hx    Pseudochol deficiency Neg Hx    Colon cancer Neg Hx      Review of Systems:   Review of Systems  Constitutional: Negative.   Respiratory: Negative.    Cardiovascular:  Negative for chest pain.  Gastrointestinal: Negative.      Physical Exam: BP 140/65    Pulse 76    Resp 20    Ht 6\' 2"  (1.88 m)    Wt 250 lb (113.4 kg)    SpO2 95% Comment: RA   BMI 32.10 kg/m  Physical Exam Constitutional:      General: He is not in acute distress.    Appearance: Normal appearance. He is normal weight. He is not ill-appearing.  Cardiovascular:     Rate and Rhythm: Normal rate and regular rhythm.     Heart sounds: No murmur heard. Pulmonary:     Effort: Pulmonary effort is normal. No respiratory distress.     Breath sounds: Normal breath sounds.  Abdominal:     General: Abdomen is flat.  Musculoskeletal:        General: Normal range of motion.     Cervical back: Normal range of motion.  Skin:    General: Skin is warm and dry.  Neurological:     General: No focal deficit present.     Mental Status: He is alert and oriented to person, place, and time.      Diagnostic Studies & Laboratory data:    Left Heart Catherization: Intervention  Echo:  1. Left ventricular ejection fraction, by visual estimation, is 60 to  65%. The left ventricle has normal function. There is no left ventricular  hypertrophy.   2. Elevated left ventricular end-diastolic pressure.   3. Left ventricular diastolic parameters are consistent with Grade I  diastolic dysfunction (impaired relaxation).   4. Global right ventricle has normal systolic function.The right  ventricular size is normal. No increase in right ventricular wall  thickness.   5. Left atrial size was normal.   6. Right atrial size was normal.   7. Mild aortic valve annular calcification.   8. Mild mitral annular  calcification.   9. The mitral valve is grossly normal. Trace mitral valve regurgitation.  10. The tricuspid valve is grossly normal. Tricuspid valve regurgitation  is trivial.  11. The aortic valve  is tricuspid. Aortic valve regurgitation is mild.  Mild aortic valve sclerosis without stenosis.  12. There is Mild calcification of the aortic valve.  13. There is Mild thickening of the aortic valve.  14. The pulmonic valve was grossly normal. Pulmonic valve regurgitation is  not visualized.  15. Aortic dilatation noted.  16. There is mild dilatation of the aortic root.  17. Normal pulmonary artery systolic pressure.  18. The interatrial septum was not well visualized.   EKG: Sinus bradycardia with first-degree AV block I have independently reviewed the above radiologic studies and discussed with the patient   Recent Lab Findings: Lab Results  Component Value Date   WBC 4.5 01/11/2021   HGB 14.5 01/11/2021   HCT 43.0 01/11/2021   PLT 167 01/11/2021   GLUCOSE 96 01/11/2021   GLUCOSE 96 01/11/2021   CHOL 200 11/10/2015   TRIG 208 (H) 11/10/2015   HDL 34 (L) 11/10/2015   LDLCALC 124 11/10/2015   ALT 19 04/30/2018   AST 25 04/30/2018   NA 137 01/11/2021   NA 137 01/11/2021   K 4.3 01/11/2021   K 4.3 01/11/2021   CL 99 01/11/2021   CL 99 01/11/2021   CREATININE 1.69 (H) 01/11/2021   CREATININE 1.69 (H) 01/11/2021   BUN 30 (H) 01/11/2021   BUN 30 (H) 01/11/2021   CO2 30 01/11/2021   CO2 30 01/11/2021   TSH 2.862 01/19/2011   INR 1.0 04/02/2019      Assessment / Plan:   80 year old male with two-vessel coronary artery disease.  I personally reviewed his left heart cath.  He has several tandem lesions within his LAD but there appears to be a good target.  He also has a mid RCA stenosis but his PDA would be a good target as well.  On his echocardiogram he has no significant valvular disease and preserved biventricular function.  Of note he has chronic kidney disease with a  creatinine of 1.69.  Plan for off-pump CABG x2 with LIMA to LAD and saphenous vein graft to PDA.     I  spent 40 minutes counseling the patient face to face.   Lajuana Matte 02/05/2021 4:05 PM

## 2021-02-05 ENCOUNTER — Telehealth: Payer: Self-pay | Admitting: *Deleted

## 2021-02-05 ENCOUNTER — Institutional Professional Consult (permissible substitution) (INDEPENDENT_AMBULATORY_CARE_PROVIDER_SITE_OTHER): Payer: PPO | Admitting: Thoracic Surgery (Cardiothoracic Vascular Surgery)

## 2021-02-05 VITALS — BP 140/65 | HR 76 | Resp 20 | Ht 74.0 in | Wt 250.0 lb

## 2021-02-05 DIAGNOSIS — I2510 Atherosclerotic heart disease of native coronary artery without angina pectoris: Secondary | ICD-10-CM

## 2021-02-05 NOTE — Telephone Encounter (Signed)
Patient informed. Copy sent to PCP °

## 2021-02-05 NOTE — Telephone Encounter (Signed)
-----   Message from Satira Sark, MD sent at 02/04/2021  5:00 PM EST ----- Results reviewed.  LVEF low normal range at 50 to 55%.  Recent cardiac catheterization reviewed with plan for TCTS consultation.

## 2021-02-07 NOTE — Progress Notes (Signed)
Sent via mail 

## 2021-02-10 ENCOUNTER — Other Ambulatory Visit: Payer: Self-pay | Admitting: *Deleted

## 2021-02-10 ENCOUNTER — Encounter: Payer: Self-pay | Admitting: *Deleted

## 2021-02-10 DIAGNOSIS — I2510 Atherosclerotic heart disease of native coronary artery without angina pectoris: Secondary | ICD-10-CM

## 2021-02-17 ENCOUNTER — Other Ambulatory Visit: Payer: Self-pay

## 2021-02-17 ENCOUNTER — Other Ambulatory Visit: Payer: PPO

## 2021-02-17 DIAGNOSIS — C61 Malignant neoplasm of prostate: Secondary | ICD-10-CM

## 2021-02-18 ENCOUNTER — Other Ambulatory Visit: Payer: PPO

## 2021-02-18 LAB — PSA: Prostate Specific Ag, Serum: <0.1 ng/mL (ref 0.0–4.0)

## 2021-02-24 ENCOUNTER — Encounter: Payer: Self-pay | Admitting: Cardiology

## 2021-02-24 NOTE — Progress Notes (Signed)
Cardiology Office Note  Date: 02/25/2021   ID: JONCARLOS ATKISON, DOB November 20, 1940, MRN [id number]  PCP:  Celene Squibb, MD  Cardiologist:  Rozann Lesches, MD Electrophysiologist:  None   Chief Complaint  Patient presents with   Cardiac follow-up    History of Present Illness: [name] is an 81 y.o. male last seen in November 2022.  He was referred for a diagnostic cardiac catheterization and at that time, performed by Dr. Gwenlyn Found on November 28 and found to have two-vessel obstructive CAD with long calcified proximal to mid LAD stenosis and moderate mid RCA stenosis.  It was ultimately felt that surgical revascularization was the best option and he was seen by Dr. Kipp Brood with plan to pursue CABG on January 17.  He is here today for a follow-up visit.  He reports compliance with current medications as noted below.  Plans to follow-up for presurgical vascular studies as scheduled.  Past Medical History:  Diagnosis Date   Arthritis    CAD (coronary artery disease)    Two-vessel obstructive disease November 2022   Chronic back pain    CKD (chronic kidney disease) stage 3, GFR 30-59 ml/min (HCC)    Essential hypertension    GERD (gastroesophageal reflux disease)    Helicobacter pylori gastritis    Treated   Hx of adenomatous colonic polyps    IBS (irritable bowel syndrome)    Prostate CA (HCC)    Seed implants   Renal insufficiency    Schatzki's ring     Past Surgical History:  Procedure Laterality Date   APPENDECTOMY  age 64   BACK SURGERY  02/08   [facility]   BACK SURGERY  09/24/04   [facility]umbar, [facility]   BACK SURGERY  09/18/02   [facility]   BILIARY STENT PLACEMENT  01/07/2012   Procedure: BILIARY STENT PLACEMENT;  Surgeon: Rogene Houston, MD;  [facility];  Service: Endoscopy;  Laterality: N/A;   BILIARY STENT PLACEMENT  02/21/2012   Procedure: BILIARY STENT PLACEMENT;  Surgeon: Rogene Houston, MD;  Location: [facility];  Service: Endoscopy;  Laterality: N/A;  Biliary Stent  Replacement   CATARACT EXTRACTION W/PHACO  11/22/2010   Procedure: CATARACT EXTRACTION PHACO AND INTRAOCULAR LENS PLACEMENT ([place]);  Surgeon: Williams Che;  Location: [facility];  Service: Ophthalmology;  Laterality: Right;  CDE: 6.81   CHOLECYSTECTOMY  03/04/05   [facility], [facility]. Gangrenous cholecystitis complicated by abscess requiring percutaneous drainage. He also had common duct stones requiring ERCP and sphincterotomy.   COLONOSCOPY  August 2005   Scattered sigmoid diverticulosis, splenic flexure polyp. Hyperplastic   COLONOSCOPY  1997   3 cm tubular adenoma and a sending colon   COLONOSCOPY  02/01/2011   Rourk-friable anal canal, hyperplastic rectal polyp   COLONOSCOPY N/A 08/15/2013   Procedure: COLONOSCOPY;  Surgeon: Rogene Houston, MD;  [facility];  Service: Endoscopy;  Laterality: N/A;  100   COLONOSCOPY N/A 09/20/2018   Procedure: COLONOSCOPY;  Surgeon: Rogene Houston, MD;  [facility];  Service: Endoscopy;  Laterality: N/A;  12:00pm   ERCP  03/02/05   [facility], [facility]. Normal-appearing biliary tree (gallbladder not image), status post sphincterotomy with recovery of small pieces of stone material, status post balloon occlusion cholangiogram.   ERCP  01/07/2012   Procedure: ENDOSCOPIC RETROGRADE CHOLANGIOPANCREATOGRAPHY (ERCP);  Surgeon: Rogene Houston, MD;  [facility];  Service: Endoscopy;  Laterality: N/A;  possible biliary stenting   ERCP  02/21/2012  Procedure: ENDOSCOPIC RETROGRADE CHOLANGIOPANCREATOGRAPHY (ERCP);  Surgeon: Rogene Houston, MD;  [facility];  Service: Endoscopy;  Laterality: N/A;  common bile duct stone and clip removed   ERCP N/A 05/18/2012   Procedure: ENDOSCOPIC RETROGRADE CHOLANGIOPANCREATOGRAPHY (ERCP);  Surgeon: Rogene Houston, MD;  [facility];  Service: Endoscopy;  Laterality: N/A;  stone and debri removal   ESOPHAGOGASTRODUODENOSCOPY  August 2005   Erosive esophagitis and Schatzki ring, small hiatal hernia    ESOPHAGOGASTRODUODENOSCOPY  01/2008   Mild reflux esophagitis, small hiatal hernia   ESOPHAGOGASTRODUODENOSCOPY  02/01/2011   Rourk-erosive reflux esophagitis, small hiatal hernia   ESOPHAGOGASTRODUODENOSCOPY  10/26/2011   Dr. Eli Phillips gastric submucosal petechia (bx-benign ulceration), juxta ampullary duodenal diverticulum and some mucosal edema involving 1st/2nd portion of duodenum with superficial erosions (bx-superficial ulceration/benign)   ESOPHAGOGASTRODUODENOSCOPY  02/21/2012   Procedure: ESOPHAGOGASTRODUODENOSCOPY (EGD);  Surgeon: Rogene Houston, MD;  [facility];  Service: Endoscopy;  Laterality: N/A;   EYE SURGERY     Incomplete colonoscopy  December 2009   Left-sided diverticula, mid descending colon polyp, due to recurrent looping and redundancy exam was incomplete. It was felt that the mid colon was reached. Followup barium enema showed colon interposition between the liver and the diaphragm, redundant sigmoid colon but no colon mass or polyp identified.. Pathology revealed tubular adenoma.   LEFT HEART CATH AND CORONARY ANGIOGRAPHY N/A 01/18/2021   Procedure: LEFT HEART CATH AND CORONARY ANGIOGRAPHY;  Surgeon: Lorretta Harp, MD;  Location: [place] [facility];  Service: Cardiovascular;  Laterality: N/A;   POLYPECTOMY  09/20/2018   Procedure: POLYPECTOMY;  Surgeon: Rogene Houston, MD;  [facility];  Service: Endoscopy;;  Hepatic flexure polyp cold snare, splenic flexure polyp   SPHINCTEROTOMY  01/07/2012   Procedure: SPHINCTEROTOMY;  Surgeon: Rogene Houston, MD;  Location: [facility];  Service: Endoscopy;  Laterality: N/A;  Extended   SPYGLASS CHOLANGIOSCOPY  02/21/2012   Procedure: SPYGLASS CHOLANGIOSCOPY;  Surgeon: Rogene Houston, MD;  [facility];  Service: Endoscopy;  Laterality: N/A;   SPYGLASS CHOLANGIOSCOPY N/A 05/18/2012   Procedure: SPYGLASS CHOLANGIOSCOPY;  Surgeon: Rogene Houston, MD;  Location: [facility];  Service: Endoscopy;  Laterality: N/A;    TOTAL KNEE ARTHROPLASTY  03/21/2012   Procedure: TOTAL KNEE ARTHROPLASTY;  Surgeon: [name], MD;  Location: [place];  Service: Orthopedics;  Laterality: Right;  right knee arthroplasty   TOTAL KNEE ARTHROPLASTY Left 04/08/2019   Procedure: LEFT TOTAL KNEE ARTHROPLASTY;  Surgeon: Frederik Pear, MD;  [facility];  Service: Orthopedics;  Laterality: Left;    Current Outpatient Medications  Medication Sig Dispense Refill   amLODipine (NORVASC) 5 MG tablet Take 5 mg by mouth at bedtime.     Ascorbic Acid (VITAMIN C) 1000 MG tablet Take 1,000 mg by mouth daily.     aspirin EC 81 MG tablet Take 81 mg by mouth daily. Swallow whole.     atorvastatin (LIPITOR) 10 MG tablet Take 10 mg by mouth daily.     cholecalciferol (VITAMIN D3) 25 MCG (1000 UNIT) tablet Take 1,000 Units by mouth daily.     losartan (COZAAR) 50 MG tablet Take 50 mg by mouth daily.     neomycin-polymyxin-hydrocortisone (CORTISPORIN) 3.5-10000-1 OTIC suspension Place 4 drops into the right ear 3 (three) times daily. 10 mL 0   zinc gluconate 50 MG tablet Take 50 mg by mouth daily.     Current Facility-Administered Medications  Medication Dose Route Frequency Provider Last Rate Last  Admin   sodium chloride flush (NS) 0.9 % injection 3 mL  3 mL Intravenous Q12H Satira Sark, MD       Allergies:  Patient has no known allergies.   ROS: Dyspnea on exertion, intermittent angina.  No palpitations or syncope.  Physical Exam: VS:  BP 128/60    Pulse (!) 59    Ht 6\' 2"  (1.88 m)    Wt 249 lb 6.4 oz (113.1 kg)    SpO2 95%    BMI 32.02 kg/m , BMI Body mass index is 32.02 kg/m.  Wt Readings from Last 3 Encounters:  02/25/21 249 lb 6.4 oz (113.1 kg)  02/05/21 250 lb (113.4 kg)  01/18/21 248 lb (112.5 kg)    General: Patient appears comfortable at rest. HEENT: Conjunctiva and lids normal, wearing a mask. Neck: Supple, no elevated JVP or carotid bruits, no thyromegaly. Lungs: Clear to auscultation, nonlabored breathing at  rest. Cardiac: Regular rate and rhythm, no S3, 1/6 systolic murmur. Extremities: No pitting edema.  ECG:  An ECG dated 01/11/2021 was personally reviewed today and demonstrated:  Sinus bradycardia with prolonged PR interval, right bundle branch block, and left anterior fascicular block.  Recent Labwork: 01/11/2021: BUN 30; BUN 30; Creat 1.69; Creat 1.69; Hemoglobin 14.5; Platelets 167; Potassium 4.3; Potassium 4.3; Sodium 137; Sodium 137     Component Value Date/Time   CHOL 200 11/10/2015 0734   TRIG 208 (H) 11/10/2015 0734   HDL 34 (L) 11/10/2015 0734   CHOLHDL 5.9 (H) 11/10/2015 0734   VLDL 42 (H) 11/10/2015 0734   LDLCALC 124 11/10/2015 0734    Other Studies Reviewed Today:  Cardiac catheterization 01/18/2021:   Prox RCA to Mid RCA lesion is 60% stenosed.   Prox LAD lesion is 50% stenosed.   Prox LAD to Mid LAD lesion is 80% stenosed.   Mid LAD lesion is 90% stenosed.  Echocardiogram 02/04/2021:  1. Left ventricular ejection fraction, by estimation, is 50 to 55%. The  left ventricle has low normal function. The left ventricle has no regional  wall motion abnormalities. There is mild left ventricular hypertrophy of  the basal-septal segment. Left  ventricular diastolic parameters are consistent with Grade I diastolic  dysfunction (impaired relaxation). Elevated left atrial pressure.   2. Right ventricular systolic function is normal. The right ventricular  size is normal.   3. Left atrial size was mildly dilated.   4. The mitral valve is normal in structure. No evidence of mitral valve  regurgitation. No evidence of mitral stenosis.   5. The aortic valve is calcified. Aortic valve regurgitation is mild. No  aortic stenosis is present.   6. Aortic dilatation noted. There is borderline dilatation of the aortic  root, measuring 38 mm.   Assessment and Plan:  1.  CAD involving LAD and RCA with plan for surgical revascularization with Dr. Kipp Brood on January 17.  LVEF 50 to  55% range.  Follow-up for presurgical vascular studies.  Continue aspirin, Lipitor, Norvasc, and losartan.  We will arrange a follow-up visit in the next 6 weeks.  2.  Mixed hyperlipidemia, continues on Lipitor.  3.  CKD stage IIIb, creatinine 1.69.  Medication Adjustments/Labs and Tests Ordered: Current medicines are reviewed at length with the patient today.  Concerns regarding medicines are outlined above.   Tests Ordered: No orders of the defined types were placed in this encounter.   Medication Changes: No orders of the defined types were placed in this encounter.   Disposition:  Follow  up  6 weeks in the [place] office.  Signed, Satira Sark, MD, [facility] 02/25/2021 12:47 PM    [facility] [address], [place], [place] [address] Phone: [phone]; Fax: [phone]

## 2021-02-25 ENCOUNTER — Ambulatory Visit: Payer: PPO | Admitting: Cardiology

## 2021-02-25 ENCOUNTER — Encounter: Payer: Self-pay | Admitting: Urology

## 2021-02-25 ENCOUNTER — Ambulatory Visit (INDEPENDENT_AMBULATORY_CARE_PROVIDER_SITE_OTHER): Payer: PPO | Admitting: Urology

## 2021-02-25 ENCOUNTER — Other Ambulatory Visit: Payer: Self-pay

## 2021-02-25 ENCOUNTER — Encounter: Payer: Self-pay | Admitting: Cardiology

## 2021-02-25 VITALS — BP 119/64 | HR 69

## 2021-02-25 VITALS — BP 128/60 | HR 59 | Ht 74.0 in | Wt 249.4 lb

## 2021-02-25 DIAGNOSIS — E782 Mixed hyperlipidemia: Secondary | ICD-10-CM | POA: Diagnosis not present

## 2021-02-25 DIAGNOSIS — N3281 Overactive bladder: Secondary | ICD-10-CM

## 2021-02-25 DIAGNOSIS — N1832 Chronic kidney disease, stage 3b: Secondary | ICD-10-CM | POA: Diagnosis not present

## 2021-02-25 DIAGNOSIS — R590 Localized enlarged lymph nodes: Secondary | ICD-10-CM | POA: Diagnosis not present

## 2021-02-25 DIAGNOSIS — N3941 Urge incontinence: Secondary | ICD-10-CM | POA: Diagnosis not present

## 2021-02-25 DIAGNOSIS — C61 Malignant neoplasm of prostate: Secondary | ICD-10-CM | POA: Diagnosis not present

## 2021-02-25 DIAGNOSIS — I25119 Atherosclerotic heart disease of native coronary artery with unspecified angina pectoris: Secondary | ICD-10-CM

## 2021-02-25 DIAGNOSIS — Z8546 Personal history of malignant neoplasm of prostate: Secondary | ICD-10-CM | POA: Diagnosis not present

## 2021-02-25 LAB — URINALYSIS, ROUTINE W REFLEX MICROSCOPIC
Bilirubin, UA: NEGATIVE
Glucose, UA: NEGATIVE
Leukocytes,UA: NEGATIVE
Nitrite, UA: NEGATIVE
Protein,UA: NEGATIVE
RBC, UA: NEGATIVE
Specific Gravity, UA: 1.015 (ref 1.005–1.030)
Urobilinogen, Ur: 0.2 mg/dL (ref 0.2–1.0)
pH, UA: 6 (ref 5.0–7.5)

## 2021-02-25 NOTE — Patient Instructions (Addendum)
Medication Instructions:  Your physician recommends that you continue on your current medications as directed. Please refer to the Current Medication list given to you today.  Labwork: none  Testing/Procedures: none  Follow-Up: Your physician recommends that you schedule a follow-up appointment in: 6 weeks  Any Other Special Instructions Will Be Listed Below (If Applicable).  If you need a refill on your cardiac medications before your next appointment, please call your pharmacy. 

## 2021-02-25 NOTE — Progress Notes (Signed)
Subjective:  1. History of prostate cancer   2. Urge incontinence   3. Overactive bladder   4. Lymphadenopathy, retroperitoneal     01/31/20: [name] returns today in f/u from recent imaging.  He had a CT for the abdominal pain he had at his last visit and was found to have some small but slightly larger left iliac adenopathy.  He then had a PSMA PET that showed mild uptake in the nodes and this is felt to be either indolent nodal metastases vs slightly enlarged benign nodes.  His PSA remains <0.1 in 10/21.  He has continue to have some abdominal discomfort and has seen GI but a cause has not been clearly determined.  He has chronic constipation.    He is having SUI and uses paper towel.  He has urgency with UUI that has progressed.  He has nocturia x 3.   He had previously been on Oxybutynin and he was given samples of Gemtesa which helped a little bit.  He had UDS in the past that showed a 215ml bladder with some instability.   1/5/23[name] returns today in f/u.  His PSA prior to this visit remains undetectible.  A repeat CT [facility] on 01/26/21 showed no further adenopathy or other concerning findings.  He is going to have a CABG on 03/09/21 by Dr. Kipp Brood.  He had been having DOE.  He has persistent MUI.  He didn't get much benefit from oxybutynin or myrbetriq.  He is using a paper towel in the underwear and has moderate nocturia.  He has had no hematuria.  He had a seed implant remotely.      ROS:  ROS:  A complete review of systems was performed.  All systems are negative except for pertinent findings as noted.   ROS  No Known Allergies  Outpatient Encounter Medications as of 02/25/2021  Medication Sig   amLODipine (NORVASC) 5 MG tablet Take 5 mg by mouth at bedtime.   Ascorbic Acid (VITAMIN C) 1000 MG tablet Take 1,000 mg by mouth daily.   aspirin EC 81 MG tablet Take 81 mg by mouth daily. Swallow whole.   atorvastatin (LIPITOR) 10 MG tablet Take 10 mg by mouth daily.   cholecalciferol  (VITAMIN D3) 25 MCG (1000 UNIT) tablet Take 1,000 Units by mouth daily.   losartan (COZAAR) 50 MG tablet Take 50 mg by mouth daily.   neomycin-polymyxin-hydrocortisone (CORTISPORIN) 3.5-10000-1 OTIC suspension Place 4 drops into the right ear 3 (three) times daily.   zinc gluconate 50 MG tablet Take 50 mg by mouth daily.   Facility-Administered Encounter Medications as of 02/25/2021  Medication   sodium chloride flush (NS) 0.9 % injection 3 mL    Past Medical History:  Diagnosis Date   Arthritis    CAD (coronary artery disease)    Two-vessel obstructive disease November 2022   Chronic back pain    CKD (chronic kidney disease) stage 3, GFR 30-59 ml/min (HCC)    Essential hypertension    GERD (gastroesophageal reflux disease)    Helicobacter pylori gastritis    Treated   Hx of adenomatous colonic polyps    IBS (irritable bowel syndrome)    Prostate CA (HCC)    Seed implants   Renal insufficiency    Schatzki's ring     Past Surgical History:  Procedure Laterality Date   APPENDECTOMY  age 36   BACK SURGERY  02/08   [facility]   BACK SURGERY  09/24/04   [facility], [facility]  BACK SURGERY  09/18/02   [facility]   BILIARY STENT PLACEMENT  01/07/2012   Procedure: BILIARY STENT PLACEMENT;  Surgeon: Rogene Houston, MD;  Location: [facility];  Service: Endoscopy;  Laterality: N/A;   BILIARY STENT PLACEMENT  02/21/2012   Procedure: BILIARY STENT PLACEMENT;  Surgeon: Rogene Houston, MD;  Location: [facility];  Service: Endoscopy;  Laterality: N/A;  Biliary Stent Replacement   CATARACT EXTRACTION W/PHACO  11/22/2010   Procedure: CATARACT EXTRACTION PHACO AND INTRAOCULAR LENS PLACEMENT ([place]);  Surgeon: Williams Che;  Location: [facility];  Service: Ophthalmology;  Laterality: Right;  CDE: 6.81   CHOLECYSTECTOMY  03/04/05   [facility], [facility]. Gangrenous cholecystitis complicated by abscess requiring percutaneous drainage. He also had common duct stones requiring ERCP and sphincterotomy.   COLONOSCOPY  August 2005    Scattered sigmoid diverticulosis, splenic flexure polyp. Hyperplastic   COLONOSCOPY  1997   3 cm tubular adenoma and a sending colon   COLONOSCOPY  02/01/2011   Rourk-friable anal canal, hyperplastic rectal polyp   COLONOSCOPY N/A 08/15/2013   Procedure: COLONOSCOPY;  Surgeon: Rogene Houston, MD;  [facility];  Service: Endoscopy;  Laterality: N/A;  100   COLONOSCOPY N/A 09/20/2018   Procedure: COLONOSCOPY;  Surgeon: Rogene Houston, MD;  [facility];  Service: Endoscopy;  Laterality: N/A;  12:00pm   ERCP  03/02/05   [facility], R[facility]k. Normal-appearing biliary tree (gallbladder not image), status post sphincterotomy with recovery of small pieces of stone material, status post balloon occlusion cholangiogram.   ERCP  01/07/2012   Procedure: ENDOSCOPIC RETROGRADE CHOLANGIOPANCREATOGRAPHY (ERCP);  Surgeon: Rogene Houston, MD;  Location: [facility];  Service: Endoscopy;  Laterality: N/A;  possible biliary stenting   ERCP  02/21/2012   Procedure: ENDOSCOPIC RETROGRADE CHOLANGIOPANCREATOGRAPHY (ERCP);  Surgeon: Rogene Houston, MD;  Location: [facility];  Service: Endoscopy;  Laterality: N/A;  common bile duct stone and clip removed   ERCP N/A 05/18/2012   Procedure: ENDOSCOPIC RETROGRADE CHOLANGIOPANCREATOGRAPHY (ERCP);  Surgeon: Rogene Houston, MD;  Location: [facility];  Service: Endoscopy;  Laterality: N/A;  stone and debri removal   ESOPHAGOGASTRODUODENOSCOPY  August 2005   Erosive esophagitis and Schatzki ring, small hiatal hernia   ESOPHAGOGASTRODUODENOSCOPY  01/2008   Mild reflux esophagitis, small hiatal hernia   ESOPHAGOGASTRODUODENOSCOPY  02/01/2011   Rourk-erosive reflux esophagitis, small hiatal hernia   ESOPHAGOGASTRODUODENOSCOPY  10/26/2011   Dr. Eli Phillips gastric submucosal petechia (bx-benign ulceration), juxta ampullary duodenal diverticulum and some mucosal edema involving 1st/2nd portion of duodenum with superficial erosions (bx-superficial ulceration/benign)    ESOPHAGOGASTRODUODENOSCOPY  02/21/2012   Procedure: ESOPHAGOGASTRODUODENOSCOPY (EGD);  Surgeon: Rogene Houston, MD;  Location: [facility];  Service: Endoscopy;  Laterality: N/A;   EYE SURGERY     Incomplete colonoscopy  December 2009   Left-sided diverticula, mid descending colon polyp, due to recurrent looping and redundancy exam was incomplete. It was felt that the mid colon was reached. Followup barium enema showed colon interposition between the liver and the diaphragm, redundant sigmoid colon but no colon mass or polyp identified.. Pathology revealed tubular adenoma.   LEFT HEART CATH AND CORONARY ANGIOGRAPHY N/A 01/18/2021   Procedure: LEFT HEART CATH AND CORONARY ANGIOGRAPHY;  Surgeon: Lorretta Harp, MD;  Location: [place] [facility];  Service: Cardiovascular;  Laterality: N/A;   POLYPECTOMY  09/20/2018   Procedure: POLYPECTOMY;  Surgeon: Rogene Houston, MD;  [facility];  Service: Endoscopy;;  Hepatic flexure polyp cold snare, splenic flexure polyp  SPHINCTEROTOMY  01/07/2012   Procedure: SPHINCTEROTOMY;  Surgeon: Rogene Houston, MD;  Location: [facility];  Service: Endoscopy;  Laterality: N/A;  Extended   SPYGLASS CHOLANGIOSCOPY  02/21/2012   Procedure: SPYGLASS CHOLANGIOSCOPY;  Surgeon: Rogene Houston, MD;  Location: [facility];  Service: Endoscopy;  Laterality: N/A;   SPYGLASS CHOLANGIOSCOPY N/A 05/18/2012   Procedure: SPYGLASS CHOLANGIOSCOPY;  Surgeon: Rogene Houston, MD;  Location: [facility];  Service: Endoscopy;  Laterality: N/A;   TOTAL KNEE ARTHROPLASTY  03/21/2012   Procedure: TOTAL KNEE ARTHROPLASTY;  Surgeon: [name], MD;  Location: [place];  Service: Orthopedics;  Laterality: Right;  right knee arthroplasty   TOTAL KNEE ARTHROPLASTY Left 04/08/2019   Procedure: LEFT TOTAL KNEE ARTHROPLASTY;  Surgeon: Frederik Pear, MD;  Location: [facility];  Service: Orthopedics;  Laterality: Left;    Social History   Socioeconomic History   Marital status: Married    Spouse name: Not  on file   Number of children: 2   Years of education: Not on file   Highest education level: Not on file  Occupational History   Occupation: Retired Chemical engineer: RETIRED  Tobacco Use   Smoking status: Never   Smokeless tobacco: Never  Vaping Use   Vaping Use: Never used  Substance and Sexual Activity   Alcohol use: No    Alcohol/week: 0.0 standard drinks   Drug use: No   Sexual activity: Not on file  Other Topics Concern   Not on file  Social History Narrative   Not on file   Social Determinants of Health   Financial Resource Strain: Not on file  Food Insecurity: Not on file  Transportation Needs: Not on file  Physical Activity: Not on file  Stress: Not on file  Social Connections: Not on file  Intimate Partner Violence: Not on file    Family History  Problem Relation Age of Onset   Diabetes Mother    Coronary artery disease Mother    Coronary artery disease Father    Diabetes Father    Stomach cancer Father    Deep vein thrombosis Daughter    Hypotension Neg Hx    Anesthesia problems Neg Hx    Malignant hyperthermia Neg Hx    Pseudochol deficiency Neg Hx    Colon cancer Neg Hx        Objective: Vitals:   02/25/21 1008  BP: 119/64  Pulse: 69     Physical Exam  Lab Results:  Results for orders placed or performed in visit on 02/25/21 (from the past 24 hour(s))  Urinalysis, Routine w reflex microscopic     Status: Abnormal   Collection Time: 02/25/21 10:10 AM  Result Value Ref Range   Specific Gravity, UA 1.015 1.005 - 1.030   pH, UA 6.0 5.0 - 7.5   Color, UA Yellow Yellow   Appearance Ur Clear Clear   Leukocytes,UA Negative Negative   Protein,UA Negative Negative/Trace   Glucose, UA Negative Negative   Ketones, UA Trace (A) Negative   RBC, UA Negative Negative   Bilirubin, UA Negative Negative   Urobilinogen, Ur 0.2 0.2 - 1.0 mg/dL   Nitrite, UA Negative Negative   Microscopic Examination Comment    Narrative   Performed at:   [facility] [address], [place], [place]  [id number] Lab Director: Mina Marble MT, Phone:  [phone]    BMET No results for input(s): NA, K, CL, CO2, GLUCOSE, BUN, CREATININE, CALCIUM in the last  72 hours. PSA <0.1 in 9/20. <0.1 in 10/21 <0.1 in 12/22  UA reviewed.    Studies/Results:  CT  films and reports reviewed.     PSA reviewed.   Cards note reviewed.    Assessment & Plan: History of prostate cancer.  PSA remains undetectible.  I will have him return in a year with a PSA.Marland Kitchen  Lymphadenopathy.  This has resolved.   OAB with urge incontinence.  He hasn't responded to meds.  I discussed PTNS but he is not interested in treatment at this time.   CAD.  He is scheduled for a CABG.  I have messaged the surgeon about being careful with catheter placement.   No orders of the defined types were placed in this encounter.    Orders Placed This Encounter  Procedures   Urinalysis, Routine w reflex microscopic   PSA    Standing Status:   Future    Standing Expiration Date:   02/25/2022      Return in about 1 year (around 02/25/2022) for with PSA. .   CC: Celene Squibb, MD      Irine Seal 02/25/2021 Patient ID: [name], male   DOB: September 15, 1940, 81 y.o.   MRN: [id number]

## 2021-02-25 NOTE — Progress Notes (Signed)
Urological Symptom Review  Patient is experiencing the following symptoms: Get up at night to urinate Stream starts and stops Weak stream   Review of Systems  Gastrointestinal (upper)  : Negative for upper GI symptoms  Gastrointestinal (lower) : Negative for lower GI symptoms  Constitutional : Negative for symptoms  Skin: Negative for skin symptoms  Eyes: Negative for eye symptoms  Ear/Nose/Throat : Negative for Ear/Nose/Throat symptoms  Hematologic/Lymphatic: Negative for Hematologic/Lymphatic symptoms  Cardiovascular : Negative for cardiovascular symptoms  Respiratory : Negative for respiratory symptoms  Endocrine: Negative for endocrine symptoms  Musculoskeletal: Joint pain  Neurological: Negative for neurological symptoms  Psychologic: Negative for psychiatric symptoms

## 2021-02-26 ENCOUNTER — Ambulatory Visit (INDEPENDENT_AMBULATORY_CARE_PROVIDER_SITE_OTHER): Payer: PPO | Admitting: Thoracic Surgery (Cardiothoracic Vascular Surgery)

## 2021-02-26 DIAGNOSIS — I2510 Atherosclerotic heart disease of native coronary artery without angina pectoris: Secondary | ICD-10-CM

## 2021-02-26 NOTE — Progress Notes (Signed)
°   °  [address]       [place] [address]             [phone]       Patient: Home Provider: Office Consent for Telemedicine visit obtained.  Todays visit was completed via a real-time telehealth (see specific modality noted below). The patient/authorized person provided oral consent at the time of the visit to engage in a telemedicine encounter with the present provider at [facility]. The patient/authorized person was informed of the potential benefits, limitations, and risks of telemedicine. The patient/authorized person expressed understanding that the laws that protect confidentiality also apply to telemedicine. The patient/authorized person acknowledged understanding that telemedicine does not provide emergency services and that he or she would need to call 911 or proceed to the nearest hospital for help if such a need arose.   Total time spent in the clinical discussion 10 minutes.  Telehealth Modality: Phone visit (audio only)  I had a telephone visit with Mr. Broce.  He had concerns about surgery and his age.  I reassured him that given his functional status and the quality of his targets he should recover well from this operation.  We will proceed with his regular surgery date.

## 2021-03-04 NOTE — Pre-Procedure Instructions (Signed)
Surgical Instructions    Your procedure is scheduled on Tuesday, January 17th.  Report to [facility] Main Entrance "A" at 05:30 A.M., then check in with the Admitting office.  Call this number if you have problems the morning of surgery:  [phone]   If you have any questions prior to your surgery date call [phone]: Open Monday-Friday 8am-4pm    Remember:  Do not eat or drink after midnight the night before your surgery      Take these medicines the morning of surgery with A SIP OF WATER  atorvastatin (LIPITOR)  neomycin-polymyxin-hydrocortisone (CORTISPORIN) ear drops   Follow your surgeon's instructions on when to stop Aspirin.  If no instructions were given by your surgeon then you will need to call the office to get those instructions.     As of today, STOP taking any Aleve, Naproxen, Ibuprofen, Motrin, Advil, Goody's, BC's, all herbal medications, fish oil, and all vitamins.                     Do NOT Smoke (Tobacco/Vaping) or drink Alcohol 24 hours prior to your procedure.  If you use a CPAP at night, you may bring all equipment for your overnight stay.   Contacts, glasses, piercing's, hearing aid's, dentures or partials may not be worn into surgery, please bring cases for these belongings.    For patients admitted to the hospital, discharge time will be determined by your treatment team.   Patients discharged the day of surgery will not be allowed to drive home, and someone needs to stay with them for 24 hours.  NO VISITORS WILL BE ALLOWED IN PRE-OP WHERE PATIENTS GET READY FOR SURGERY.  ONLY 1 SUPPORT PERSON MAY BE PRESENT IN THE WAITING ROOM WHILE YOU ARE IN SURGERY.  IF YOU ARE TO BE ADMITTED, ONCE YOU ARE IN YOUR ROOM YOU WILL BE ALLOWED TWO (2) VISITORS.  Minor children may have two parents present. Special consideration for safety and communication needs will be reviewed on a case by case basis.   Special instructions:   [facility]- Preparing For  Surgery  Before surgery, you can play an important role. Because skin is not sterile, your skin needs to be as free of germs as possible. You can reduce the number of germs on your skin by washing with CHG (chlorahexidine gluconate) Soap before surgery.  CHG is an antiseptic cleaner which kills germs and bonds with the skin to continue killing germs even after washing.    Oral Hygiene is also important to reduce your risk of infection.  Remember - BRUSH YOUR TEETH THE MORNING OF SURGERY WITH YOUR REGULAR TOOTHPASTE  Please do not use if you have an allergy to CHG or antibacterial soaps. If your skin becomes reddened/irritated stop using the CHG.  Do not shave (including legs and underarms) for at least 48 hours prior to first CHG shower. It is OK to shave your face.  Please follow these instructions carefully.   Shower the NIGHT BEFORE SURGERY and the MORNING OF SURGERY  If you chose to wash your hair, wash your hair first as usual with your normal shampoo.  After you shampoo, rinse your hair and body thoroughly to remove the shampoo.  Use CHG Soap as you would any other liquid soap. You can apply CHG directly to the skin and wash gently with a scrungie or a clean washcloth.   Apply the CHG Soap to your body ONLY FROM THE NECK DOWN.  Do  not use on open wounds or open sores. Avoid contact with your eyes, ears, mouth and genitals (private parts). Wash Face and genitals (private parts)  with your normal soap.   Wash thoroughly, paying special attention to the area where your surgery will be performed.  Thoroughly rinse your body with warm water from the neck down.  DO NOT shower/wash with your normal soap after using and rinsing off the CHG Soap.  Pat yourself dry with a CLEAN TOWEL.  Wear CLEAN PAJAMAS to bed the night before surgery  Place CLEAN SHEETS on your bed the night before your surgery  DO NOT SLEEP WITH PETS.   Day of Surgery: Shower with CHG soap. Do not wear  jewelry Do not wear lotions, powders, colognes, or deodorant. Men may shave face and neck. Do not bring valuables to the hospital. [facility] is not responsible for any belongings or valuables. Wear Clean/Comfortable clothing the morning of surgery Remember to brush your teeth WITH YOUR REGULAR TOOTHPASTE.   Please read over the following fact sheets that you were given.   3 days prior to your procedure or After your COVID test   You are not required to quarantine however you are required to wear a well-fitting mask when you are out and around people not in your household. If your mask becomes wet or soiled, replace with a new one.   Wash your hands often with soap and water for 20 seconds or clean your hands with an alcohol-based hand sanitizer that contains at least 60% alcohol.   Do not share personal items.   Notify your provider:  o if you are in close contact with someone who has COVID  o or if you develop a fever of 100.4 or greater, sneezing, cough, sore throat, shortness of breath or body aches.

## 2021-03-05 ENCOUNTER — Ambulatory Visit (HOSPITAL_COMMUNITY)
Admission: RE | Admit: 2021-03-05 | Discharge: 2021-03-05 | Disposition: A | Discharge status: Home / Self Care | Payer: PPO | Source: Ambulatory Visit | Attending: Thoracic Surgery (Cardiothoracic Vascular Surgery) | Admitting: Thoracic Surgery (Cardiothoracic Vascular Surgery)

## 2021-03-05 ENCOUNTER — Encounter (HOSPITAL_COMMUNITY)
Admission: RE | Admit: 2021-03-05 | Discharge: 2021-03-05 | Disposition: A | Discharge status: Home / Self Care | Payer: PPO | Source: Ambulatory Visit | Attending: Thoracic Surgery (Cardiothoracic Vascular Surgery) | Admitting: Thoracic Surgery (Cardiothoracic Vascular Surgery)

## 2021-03-05 ENCOUNTER — Other Ambulatory Visit: Payer: Self-pay

## 2021-03-05 ENCOUNTER — Encounter (HOSPITAL_COMMUNITY): Payer: Self-pay

## 2021-03-05 ENCOUNTER — Ambulatory Visit (HOSPITAL_BASED_OUTPATIENT_CLINIC_OR_DEPARTMENT_OTHER)
Admission: RE | Admit: 2021-03-05 | Discharge: 2021-03-05 | Disposition: A | Discharge status: Home / Self Care | Payer: PPO | Source: Ambulatory Visit | Attending: Thoracic Surgery (Cardiothoracic Vascular Surgery) | Admitting: Thoracic Surgery (Cardiothoracic Vascular Surgery)

## 2021-03-05 VITALS — BP 145/68 | HR 63 | Temp 97.5°F | Resp 17 | Ht 74.0 in | Wt 251.3 lb

## 2021-03-05 DIAGNOSIS — I2510 Atherosclerotic heart disease of native coronary artery without angina pectoris: Secondary | ICD-10-CM | POA: Insufficient documentation

## 2021-03-05 DIAGNOSIS — Z9889 Other specified postprocedural states: Secondary | ICD-10-CM | POA: Diagnosis not present

## 2021-03-05 DIAGNOSIS — Z20822 Contact with and (suspected) exposure to covid-19: Secondary | ICD-10-CM | POA: Insufficient documentation

## 2021-03-05 DIAGNOSIS — Z01818 Encounter for other preprocedural examination: Secondary | ICD-10-CM | POA: Insufficient documentation

## 2021-03-05 DIAGNOSIS — I700 Atherosclerosis of aorta: Secondary | ICD-10-CM | POA: Diagnosis not present

## 2021-03-05 HISTORY — DX: Personal history of urinary calculi: Z87.442

## 2021-03-05 HISTORY — DX: Dyspnea, unspecified: R06.00

## 2021-03-05 HISTORY — DX: Cerebral infarction, unspecified: I63.9

## 2021-03-05 LAB — CBC
HCT: 45.6 % (ref 39.0–52.0)
Hemoglobin: 15 g/dL (ref 13.0–17.0)
MCH: 32 pg (ref 26.0–34.0)
MCHC: 32.9 g/dL (ref 30.0–36.0)
MCV: 97.2 fL (ref 80.0–100.0)
Platelets: 152 10*3/uL (ref 150–400)
RBC: 4.69 MIL/uL (ref 4.22–5.81)
RDW: 12.7 % (ref 11.5–15.5)
WBC: 5.3 10*3/uL (ref 4.0–10.5)
nRBC: 0 % (ref 0.0–0.2)

## 2021-03-05 LAB — BLOOD GAS, ARTERIAL
Acid-Base Excess: 0.7 mmol/L (ref 0.0–2.0)
Bicarbonate: 25.3 mmol/L (ref 20.0–28.0)
FIO2: 21
O2 Saturation: 93.7 %
Patient temperature: 37
pCO2 arterial: 43.9 mmHg (ref 32.0–48.0)
pH, Arterial: 7.379 (ref 7.350–7.450)
pO2, Arterial: 84.9 mmHg (ref 83.0–108.0)

## 2021-03-05 LAB — HEMOGLOBIN A1C
Hgb A1c MFr Bld: 5.7 % — ABNORMAL HIGH (ref 4.8–5.6)
Mean Plasma Glucose: 116.89 mg/dL

## 2021-03-05 LAB — COMPREHENSIVE METABOLIC PANEL
ALT: 28 U/L (ref 0–44)
AST: 29 U/L (ref 15–41)
Albumin: 3.9 g/dL (ref 3.5–5.0)
Alkaline Phosphatase: 59 U/L (ref 38–126)
Anion gap: 13 (ref 5–15)
BUN: 30 mg/dL — ABNORMAL HIGH (ref 8–23)
CO2: 22 mmol/L (ref 22–32)
Calcium: 9.5 mg/dL (ref 8.9–10.3)
Chloride: 102 mmol/L (ref 98–111)
Creatinine, Ser: 1.46 mg/dL — ABNORMAL HIGH (ref 0.61–1.24)
GFR, Estimated: 48 mL/min — ABNORMAL LOW (ref >60)
Glucose, Bld: 111 mg/dL — ABNORMAL HIGH (ref 70–99)
Potassium: 4.3 mmol/L (ref 3.5–5.1)
Sodium: 137 mmol/L (ref 135–145)
Total Bilirubin: 0.8 mg/dL (ref 0.3–1.2)
Total Protein: 7.1 g/dL (ref 6.5–8.1)

## 2021-03-05 LAB — SURGICAL PCR SCREEN
MRSA, PCR: NEGATIVE
Staphylococcus aureus: POSITIVE — AB

## 2021-03-05 LAB — PROTIME-INR
INR: 1 (ref 0.8–1.2)
Prothrombin Time: 13.6 seconds (ref 11.4–15.2)

## 2021-03-05 LAB — TYPE AND SCREEN
ABO/RH(D): A POS
Antibody Screen: NEGATIVE

## 2021-03-05 LAB — APTT: aPTT: 26 seconds (ref 24–36)

## 2021-03-05 NOTE — Progress Notes (Signed)
PCP - Emi Belfast Cardiologist - Rozann Lesches Urologist: Jeffie Pollock  PPM/ICD - denies   Chest x-ray - 03/05/21 EKG - 03/05/21 Stress Test - 09/06/17 ECHO - 02/04/21 Cardiac Cath - 01/18/21  Sleep Study - denies   Patient instructed to hold all Aspirin, NSAID's, herbal medications, fish oil and vitamins 7 days prior to surgery.   ERAS Protcol -no   COVID TEST- 03/05/21 in PAT   Anesthesia review: yes, cardiac history   Patient denies shortness of breath, fever, cough and chest pain at PAT appointment   All instructions explained to the patient, with a verbal understanding of the material. Patient agrees to go over the instructions while at home for a better understanding. Patient also instructed to self quarantine after being tested for COVID-19. The opportunity to ask questions was provided.

## 2021-03-06 LAB — SARS CORONAVIRUS 2 (TAT 6-24 HRS): SARS Coronavirus 2: NEGATIVE

## 2021-03-08 MED ORDER — PHENYLEPHRINE HCL-NACL 20-0.9 MG/250ML-% IV SOLN
30.0000 ug/min | INTRAVENOUS | Status: AC
  Administered 2021-03-09: 40 ug/min via INTRAVENOUS
  Filled 2021-03-08: qty 250

## 2021-03-08 MED ORDER — POTASSIUM CHLORIDE 2 MEQ/ML IV SOLN
80.0000 meq | INTRAVENOUS | Status: DC
  Filled 2021-03-08: qty 40

## 2021-03-08 MED ORDER — NITROGLYCERIN IN D5W 200-5 MCG/ML-% IV SOLN
2.0000 ug/min | INTRAVENOUS | Status: DC
  Filled 2021-03-08: qty 250

## 2021-03-08 MED ORDER — CEFAZOLIN SODIUM-DEXTROSE 2-4 GM/100ML-% IV SOLN
2.0000 g | INTRAVENOUS | Status: DC
  Filled 2021-03-08: qty 100

## 2021-03-08 MED ORDER — TRANEXAMIC ACID (OHS) PUMP PRIME SOLUTION
2.0000 mg/kg | INTRAVENOUS | Status: DC
  Filled 2021-03-08: qty 2.28

## 2021-03-08 MED ORDER — NOREPINEPHRINE 4 MG/250ML-% IV SOLN
0.0000 ug/min | INTRAVENOUS | Status: DC
  Filled 2021-03-08: qty 250

## 2021-03-08 MED ORDER — HEPARIN 30,000 UNITS/1000 ML (OHS) CELLSAVER SOLUTION
Status: DC
  Filled 2021-03-08: qty 1000

## 2021-03-08 MED ORDER — EPINEPHRINE HCL 5 MG/250ML IV SOLN IN NS
0.0000 ug/min | INTRAVENOUS | Status: DC
  Filled 2021-03-08: qty 250

## 2021-03-08 MED ORDER — MILRINONE LACTATE IN DEXTROSE 20-5 MG/100ML-% IV SOLN
0.3000 ug/kg/min | INTRAVENOUS | Status: DC
  Filled 2021-03-08: qty 100

## 2021-03-08 MED ORDER — MANNITOL 20 % IV SOLN
INTRAVENOUS | Status: DC
  Filled 2021-03-08: qty 13

## 2021-03-08 MED ORDER — INSULIN REGULAR(HUMAN) IN NACL 100-0.9 UT/100ML-% IV SOLN
INTRAVENOUS | Status: AC
  Administered 2021-03-09: 1 [IU]/h via INTRAVENOUS
  Filled 2021-03-08: qty 100

## 2021-03-08 MED ORDER — DEXMEDETOMIDINE HCL IN NACL 400 MCG/100ML IV SOLN
0.1000 ug/kg/h | INTRAVENOUS | Status: AC
  Administered 2021-03-09: .7 ug/kg/h via INTRAVENOUS
  Filled 2021-03-08: qty 100

## 2021-03-08 MED ORDER — TRANEXAMIC ACID (OHS) BOLUS VIA INFUSION
15.0000 mg/kg | INTRAVENOUS | Status: AC
  Administered 2021-03-09: 1710 mg via INTRAVENOUS
  Filled 2021-03-08: qty 1710

## 2021-03-08 MED ORDER — VANCOMYCIN HCL 1250 MG/250ML IV SOLN
1250.0000 mg | INTRAVENOUS | Status: DC
  Filled 2021-03-08: qty 250

## 2021-03-08 MED ORDER — PLASMA-LYTE A IV SOLN
INTRAVENOUS | Status: DC
  Filled 2021-03-08: qty 5

## 2021-03-08 MED ORDER — TRANEXAMIC ACID 1000 MG/10ML IV SOLN
1.5000 mg/kg/h | INTRAVENOUS | Status: AC
  Administered 2021-03-09: 1.5 mg/kg/h via INTRAVENOUS
  Filled 2021-03-08: qty 25

## 2021-03-08 MED ORDER — CEFAZOLIN SODIUM-DEXTROSE 2-4 GM/100ML-% IV SOLN
2.0000 g | INTRAVENOUS | Status: AC
  Administered 2021-03-09: 2 g via INTRAVENOUS
  Filled 2021-03-08: qty 100

## 2021-03-08 MED ORDER — VANCOMYCIN HCL 1500 MG/300ML IV SOLN
1500.0000 mg | INTRAVENOUS | Status: AC
  Administered 2021-03-09: 1500 mg via INTRAVENOUS
  Filled 2021-03-08 (×2): qty 300

## 2021-03-09 ENCOUNTER — Other Ambulatory Visit: Payer: Self-pay

## 2021-03-09 ENCOUNTER — Inpatient Hospital Stay (HOSPITAL_COMMUNITY)
Admission: RE | Admit: 2021-03-09 | Discharge: 2021-03-16 | DRG: 236 | Disposition: A | Discharge status: Home Health | Payer: PPO | Attending: Thoracic Surgery (Cardiothoracic Vascular Surgery) | Admitting: Thoracic Surgery (Cardiothoracic Vascular Surgery)

## 2021-03-09 ENCOUNTER — Inpatient Hospital Stay (HOSPITAL_COMMUNITY): Payer: PPO

## 2021-03-09 ENCOUNTER — Encounter (HOSPITAL_COMMUNITY): Payer: Self-pay | Admitting: Thoracic Surgery (Cardiothoracic Vascular Surgery)

## 2021-03-09 ENCOUNTER — Inpatient Hospital Stay (HOSPITAL_COMMUNITY)
Admission: RE | Disposition: A | Discharge status: Home Health | Payer: Self-pay | Source: Home / Self Care | Attending: Thoracic Surgery (Cardiothoracic Vascular Surgery)

## 2021-03-09 ENCOUNTER — Inpatient Hospital Stay (HOSPITAL_COMMUNITY): Payer: PPO | Admitting: Physician Assistant

## 2021-03-09 ENCOUNTER — Inpatient Hospital Stay (HOSPITAL_COMMUNITY): Payer: PPO | Admitting: Certified Registered Nurse Anesthetist

## 2021-03-09 DIAGNOSIS — J9811 Atelectasis: Secondary | ICD-10-CM | POA: Diagnosis not present

## 2021-03-09 DIAGNOSIS — Z8619 Personal history of other infectious and parasitic diseases: Secondary | ICD-10-CM

## 2021-03-09 DIAGNOSIS — Z7982 Long term (current) use of aspirin: Secondary | ICD-10-CM | POA: Diagnosis not present

## 2021-03-09 DIAGNOSIS — Q438 Other specified congenital malformations of intestine: Secondary | ICD-10-CM

## 2021-03-09 DIAGNOSIS — E669 Obesity, unspecified: Secondary | ICD-10-CM | POA: Diagnosis present

## 2021-03-09 DIAGNOSIS — N1832 Chronic kidney disease, stage 3b: Secondary | ICD-10-CM | POA: Diagnosis present

## 2021-03-09 DIAGNOSIS — K589 Irritable bowel syndrome without diarrhea: Secondary | ICD-10-CM | POA: Diagnosis present

## 2021-03-09 DIAGNOSIS — Z8249 Family history of ischemic heart disease and other diseases of the circulatory system: Secondary | ICD-10-CM

## 2021-03-09 DIAGNOSIS — Z79899 Other long term (current) drug therapy: Secondary | ICD-10-CM

## 2021-03-09 DIAGNOSIS — K2100 Gastro-esophageal reflux disease with esophagitis, without bleeding: Secondary | ICD-10-CM | POA: Diagnosis present

## 2021-03-09 DIAGNOSIS — I129 Hypertensive chronic kidney disease with stage 1 through stage 4 chronic kidney disease, or unspecified chronic kidney disease: Secondary | ICD-10-CM | POA: Diagnosis not present

## 2021-03-09 DIAGNOSIS — Z86010 Personal history of colonic polyps: Secondary | ICD-10-CM

## 2021-03-09 DIAGNOSIS — I10 Essential (primary) hypertension: Secondary | ICD-10-CM

## 2021-03-09 DIAGNOSIS — I509 Heart failure, unspecified: Secondary | ICD-10-CM | POA: Diagnosis not present

## 2021-03-09 DIAGNOSIS — K567 Ileus, unspecified: Secondary | ICD-10-CM

## 2021-03-09 DIAGNOSIS — Z8719 Personal history of other diseases of the digestive system: Secondary | ICD-10-CM | POA: Diagnosis not present

## 2021-03-09 DIAGNOSIS — I452 Bifascicular block: Secondary | ICD-10-CM | POA: Diagnosis not present

## 2021-03-09 DIAGNOSIS — K219 Gastro-esophageal reflux disease without esophagitis: Secondary | ICD-10-CM

## 2021-03-09 DIAGNOSIS — I2510 Atherosclerotic heart disease of native coronary artery without angina pectoris: Secondary | ICD-10-CM | POA: Diagnosis not present

## 2021-03-09 DIAGNOSIS — I4891 Unspecified atrial fibrillation: Secondary | ICD-10-CM | POA: Diagnosis not present

## 2021-03-09 DIAGNOSIS — Z833 Family history of diabetes mellitus: Secondary | ICD-10-CM

## 2021-03-09 DIAGNOSIS — I495 Sick sinus syndrome: Secondary | ICD-10-CM | POA: Diagnosis present

## 2021-03-09 DIAGNOSIS — Z951 Presence of aortocoronary bypass graft: Secondary | ICD-10-CM

## 2021-03-09 DIAGNOSIS — Z981 Arthrodesis status: Secondary | ICD-10-CM | POA: Diagnosis not present

## 2021-03-09 DIAGNOSIS — Z96653 Presence of artificial knee joint, bilateral: Secondary | ICD-10-CM | POA: Diagnosis present

## 2021-03-09 DIAGNOSIS — I25118 Atherosclerotic heart disease of native coronary artery with other forms of angina pectoris: Secondary | ICD-10-CM | POA: Diagnosis not present

## 2021-03-09 DIAGNOSIS — R0602 Shortness of breath: Secondary | ICD-10-CM | POA: Diagnosis not present

## 2021-03-09 DIAGNOSIS — Z8673 Personal history of transient ischemic attack (TIA), and cerebral infarction without residual deficits: Secondary | ICD-10-CM | POA: Diagnosis not present

## 2021-03-09 DIAGNOSIS — I440 Atrioventricular block, first degree: Secondary | ICD-10-CM | POA: Diagnosis present

## 2021-03-09 DIAGNOSIS — Z87442 Personal history of urinary calculi: Secondary | ICD-10-CM

## 2021-03-09 DIAGNOSIS — N183 Chronic kidney disease, stage 3 unspecified: Secondary | ICD-10-CM | POA: Diagnosis present

## 2021-03-09 DIAGNOSIS — Z9049 Acquired absence of other specified parts of digestive tract: Secondary | ICD-10-CM

## 2021-03-09 DIAGNOSIS — R451 Restlessness and agitation: Secondary | ICD-10-CM | POA: Diagnosis present

## 2021-03-09 DIAGNOSIS — D62 Acute posthemorrhagic anemia: Secondary | ICD-10-CM

## 2021-03-09 DIAGNOSIS — Z800 Family history of malignant neoplasm of digestive organs: Secondary | ICD-10-CM

## 2021-03-09 DIAGNOSIS — R079 Chest pain, unspecified: Secondary | ICD-10-CM | POA: Diagnosis not present

## 2021-03-09 DIAGNOSIS — E8770 Fluid overload, unspecified: Secondary | ICD-10-CM | POA: Diagnosis present

## 2021-03-09 DIAGNOSIS — K6389 Other specified diseases of intestine: Secondary | ICD-10-CM | POA: Diagnosis not present

## 2021-03-09 DIAGNOSIS — J90 Pleural effusion, not elsewhere classified: Secondary | ICD-10-CM | POA: Diagnosis not present

## 2021-03-09 DIAGNOSIS — G8929 Other chronic pain: Secondary | ICD-10-CM | POA: Diagnosis not present

## 2021-03-09 DIAGNOSIS — K3189 Other diseases of stomach and duodenum: Secondary | ICD-10-CM | POA: Diagnosis not present

## 2021-03-09 DIAGNOSIS — R918 Other nonspecific abnormal finding of lung field: Secondary | ICD-10-CM | POA: Diagnosis not present

## 2021-03-09 DIAGNOSIS — I110 Hypertensive heart disease with heart failure: Secondary | ICD-10-CM | POA: Diagnosis not present

## 2021-03-09 DIAGNOSIS — I517 Cardiomegaly: Secondary | ICD-10-CM | POA: Diagnosis not present

## 2021-03-09 DIAGNOSIS — Z09 Encounter for follow-up examination after completed treatment for conditions other than malignant neoplasm: Secondary | ICD-10-CM

## 2021-03-09 DIAGNOSIS — I700 Atherosclerosis of aorta: Secondary | ICD-10-CM | POA: Diagnosis not present

## 2021-03-09 DIAGNOSIS — F05 Delirium due to known physiological condition: Secondary | ICD-10-CM | POA: Diagnosis present

## 2021-03-09 DIAGNOSIS — Z8546 Personal history of malignant neoplasm of prostate: Secondary | ICD-10-CM | POA: Diagnosis not present

## 2021-03-09 DIAGNOSIS — Z4682 Encounter for fitting and adjustment of non-vascular catheter: Secondary | ICD-10-CM

## 2021-03-09 DIAGNOSIS — I25111 Atherosclerotic heart disease of native coronary artery with angina pectoris with documented spasm: Secondary | ICD-10-CM | POA: Diagnosis not present

## 2021-03-09 HISTORY — PX: CORONARY ARTERY BYPASS GRAFT: SHX141

## 2021-03-09 HISTORY — PX: TEE WITHOUT CARDIOVERSION: SHX5443

## 2021-03-09 LAB — CBC
HCT: 36.1 % — ABNORMAL LOW (ref 39.0–52.0)
HCT: 34.1 % — ABNORMAL LOW (ref 39.0–52.0)
Hemoglobin: 12.2 g/dL — ABNORMAL LOW (ref 13.0–17.0)
Hemoglobin: 11.4 g/dL — ABNORMAL LOW (ref 13.0–17.0)
MCH: 32.3 pg (ref 26.0–34.0)
MCH: 32.3 pg (ref 26.0–34.0)
MCHC: 33.8 g/dL (ref 30.0–36.0)
MCHC: 33.4 g/dL (ref 30.0–36.0)
MCV: 95.5 fL (ref 80.0–100.0)
MCV: 96.6 fL (ref 80.0–100.0)
Platelets: 135 10*3/uL — ABNORMAL LOW (ref 150–400)
Platelets: 130 10*3/uL — ABNORMAL LOW (ref 150–400)
RBC: 3.78 MIL/uL — ABNORMAL LOW (ref 4.22–5.81)
RBC: 3.53 MIL/uL — ABNORMAL LOW (ref 4.22–5.81)
RDW: 12.8 % (ref 11.5–15.5)
RDW: 12.9 % (ref 11.5–15.5)
WBC: 8.4 10*3/uL (ref 4.0–10.5)
WBC: 10.1 10*3/uL (ref 4.0–10.5)
nRBC: 0 % (ref 0.0–0.2)
nRBC: 0 % (ref 0.0–0.2)

## 2021-03-09 LAB — POCT I-STAT, CHEM 8
BUN: 31 mg/dL — ABNORMAL HIGH (ref 8–23)
BUN: 29 mg/dL — ABNORMAL HIGH (ref 8–23)
BUN: 28 mg/dL — ABNORMAL HIGH (ref 8–23)
Calcium, Ion: 1.19 mmol/L (ref 1.15–1.40)
Calcium, Ion: 1.12 mmol/L — ABNORMAL LOW (ref 1.15–1.40)
Calcium, Ion: 1.16 mmol/L (ref 1.15–1.40)
Chloride: 102 mmol/L (ref 98–111)
Chloride: 104 mmol/L (ref 98–111)
Chloride: 104 mmol/L (ref 98–111)
Creatinine, Ser: 1.7 mg/dL — ABNORMAL HIGH (ref 0.61–1.24)
Creatinine, Ser: 1.5 mg/dL — ABNORMAL HIGH (ref 0.61–1.24)
Creatinine, Ser: 1.4 mg/dL — ABNORMAL HIGH (ref 0.61–1.24)
Glucose, Bld: 125 mg/dL — ABNORMAL HIGH (ref 70–99)
Glucose, Bld: 111 mg/dL — ABNORMAL HIGH (ref 70–99)
Glucose, Bld: 138 mg/dL — ABNORMAL HIGH (ref 70–99)
HCT: 37 % — ABNORMAL LOW (ref 39.0–52.0)
HCT: 31 % — ABNORMAL LOW (ref 39.0–52.0)
HCT: 32 % — ABNORMAL LOW (ref 39.0–52.0)
Hemoglobin: 12.6 g/dL — ABNORMAL LOW (ref 13.0–17.0)
Hemoglobin: 10.5 g/dL — ABNORMAL LOW (ref 13.0–17.0)
Hemoglobin: 10.9 g/dL — ABNORMAL LOW (ref 13.0–17.0)
Potassium: 3.8 mmol/L (ref 3.5–5.1)
Potassium: 3.7 mmol/L (ref 3.5–5.1)
Potassium: 3.8 mmol/L (ref 3.5–5.1)
Sodium: 139 mmol/L (ref 135–145)
Sodium: 139 mmol/L (ref 135–145)
Sodium: 139 mmol/L (ref 135–145)
TCO2: 26 mmol/L (ref 22–32)
TCO2: 23 mmol/L (ref 22–32)
TCO2: 25 mmol/L (ref 22–32)

## 2021-03-09 LAB — POCT I-STAT 7, (LYTES, BLD GAS, ICA,H+H)
Acid-base deficit: 1 mmol/L (ref 0.0–2.0)
Acid-base deficit: 7 mmol/L — ABNORMAL HIGH (ref 0.0–2.0)
Acid-base deficit: 5 mmol/L — ABNORMAL HIGH (ref 0.0–2.0)
Bicarbonate: 23.8 mmol/L (ref 20.0–28.0)
Bicarbonate: 19.6 mmol/L — ABNORMAL LOW (ref 20.0–28.0)
Bicarbonate: 20.8 mmol/L (ref 20.0–28.0)
Calcium, Ion: 1.2 mmol/L (ref 1.15–1.40)
Calcium, Ion: 1.16 mmol/L (ref 1.15–1.40)
Calcium, Ion: 1.14 mmol/L — ABNORMAL LOW (ref 1.15–1.40)
HCT: 35 % — ABNORMAL LOW (ref 39.0–52.0)
HCT: 33 % — ABNORMAL LOW (ref 39.0–52.0)
HCT: 31 % — ABNORMAL LOW (ref 39.0–52.0)
Hemoglobin: 11.9 g/dL — ABNORMAL LOW (ref 13.0–17.0)
Hemoglobin: 11.2 g/dL — ABNORMAL LOW (ref 13.0–17.0)
Hemoglobin: 10.5 g/dL — ABNORMAL LOW (ref 13.0–17.0)
O2 Saturation: 97 %
O2 Saturation: 89 %
O2 Saturation: 94 %
Patient temperature: 34.8
Patient temperature: 38.1
Patient temperature: 38.2
Potassium: 3.7 mmol/L (ref 3.5–5.1)
Potassium: 4.6 mmol/L (ref 3.5–5.1)
Potassium: 4.4 mmol/L (ref 3.5–5.1)
Sodium: 143 mmol/L (ref 135–145)
Sodium: 141 mmol/L (ref 135–145)
Sodium: 139 mmol/L (ref 135–145)
TCO2: 25 mmol/L (ref 22–32)
TCO2: 21 mmol/L — ABNORMAL LOW (ref 22–32)
TCO2: 22 mmol/L (ref 22–32)
pCO2 arterial: 36.8 mmHg (ref 32.0–48.0)
pCO2 arterial: 43.6 mmHg (ref 32.0–48.0)
pCO2 arterial: 41.5 mmHg (ref 32.0–48.0)
pH, Arterial: 7.409 (ref 7.350–7.450)
pH, Arterial: 7.266 — ABNORMAL LOW (ref 7.350–7.450)
pH, Arterial: 7.314 — ABNORMAL LOW (ref 7.350–7.450)
pO2, Arterial: 78 mmHg — ABNORMAL LOW (ref 83.0–108.0)
pO2, Arterial: 70 mmHg — ABNORMAL LOW (ref 83.0–108.0)
pO2, Arterial: 82 mmHg — ABNORMAL LOW (ref 83.0–108.0)

## 2021-03-09 LAB — BASIC METABOLIC PANEL
Anion gap: 7 (ref 5–15)
BUN: 28 mg/dL — ABNORMAL HIGH (ref 8–23)
CO2: 22 mmol/L (ref 22–32)
Calcium: 8 mg/dL — ABNORMAL LOW (ref 8.9–10.3)
Chloride: 109 mmol/L (ref 98–111)
Creatinine, Ser: 1.55 mg/dL — ABNORMAL HIGH (ref 0.61–1.24)
GFR, Estimated: 45 mL/min — ABNORMAL LOW (ref >60)
Glucose, Bld: 138 mg/dL — ABNORMAL HIGH (ref 70–99)
Potassium: 4.7 mmol/L (ref 3.5–5.1)
Sodium: 138 mmol/L (ref 135–145)

## 2021-03-09 LAB — GLUCOSE, CAPILLARY
Glucose-Capillary: 129 mg/dL — ABNORMAL HIGH (ref 70–99)
Glucose-Capillary: 164 mg/dL — ABNORMAL HIGH (ref 70–99)
Glucose-Capillary: 149 mg/dL — ABNORMAL HIGH (ref 70–99)
Glucose-Capillary: 147 mg/dL — ABNORMAL HIGH (ref 70–99)
Glucose-Capillary: 137 mg/dL — ABNORMAL HIGH (ref 70–99)
Glucose-Capillary: 185 mg/dL — ABNORMAL HIGH (ref 70–99)
Glucose-Capillary: 164 mg/dL — ABNORMAL HIGH (ref 70–99)
Glucose-Capillary: 147 mg/dL — ABNORMAL HIGH (ref 70–99)
Glucose-Capillary: 151 mg/dL — ABNORMAL HIGH (ref 70–99)

## 2021-03-09 LAB — PROTIME-INR
INR: 1.3 — ABNORMAL HIGH (ref 0.8–1.2)
Prothrombin Time: 16.1 seconds — ABNORMAL HIGH (ref 11.4–15.2)

## 2021-03-09 LAB — MAGNESIUM: Magnesium: 2.7 mg/dL — ABNORMAL HIGH (ref 1.7–2.4)

## 2021-03-09 LAB — APTT: aPTT: 32 seconds (ref 24–36)

## 2021-03-09 SURGERY — OFF PUMP CORONARY ARTERY BYPASS GRAFTING (CABG)
Anesthesia: General | Site: Chest

## 2021-03-09 MED ORDER — GLYCOPYRROLATE 0.2 MG/ML IJ SOLN
INTRAMUSCULAR | Status: DC | PRN
  Administered 2021-03-09: .2 mg via INTRAVENOUS

## 2021-03-09 MED ORDER — NOREPINEPHRINE 4 MG/250ML-% IV SOLN
0.0000 ug/min | INTRAVENOUS | Status: DC
  Administered 2021-03-09: 0 ug/min via INTRAVENOUS

## 2021-03-09 MED ORDER — ROCURONIUM BROMIDE 10 MG/ML (PF) SYRINGE
PREFILLED_SYRINGE | INTRAVENOUS | Status: AC
  Filled 2021-03-09: qty 10

## 2021-03-09 MED ORDER — LACTATED RINGERS IV SOLN: INTRAVENOUS | Status: DC

## 2021-03-09 MED ORDER — ONDANSETRON HCL 4 MG/2ML IJ SOLN: 4.0000 mg | Freq: Four times a day (QID) | INTRAMUSCULAR | Status: DC | PRN

## 2021-03-09 MED ORDER — BISACODYL 10 MG RE SUPP: 10.0000 mg | Freq: Every day | RECTAL | Status: DC

## 2021-03-09 MED ORDER — CHLORHEXIDINE GLUCONATE 0.12 % MT SOLN
15.0000 mL | OROMUCOSAL | Status: AC
  Administered 2021-03-09: 15 mL via OROMUCOSAL

## 2021-03-09 MED ORDER — PROTAMINE SULFATE 10 MG/ML IV SOLN
INTRAVENOUS | Status: DC | PRN
  Administered 2021-03-09: 10 mg via INTRAVENOUS
  Administered 2021-03-09: 150 mg via INTRAVENOUS

## 2021-03-09 MED ORDER — EPINEPHRINE 1 MG/10ML IJ SOSY
PREFILLED_SYRINGE | INTRAMUSCULAR | Status: DC | PRN
  Administered 2021-03-09: 10 ug via INTRAVENOUS
  Administered 2021-03-09 (×2): 5 ug via INTRAVENOUS

## 2021-03-09 MED ORDER — PANTOPRAZOLE SODIUM 40 MG PO TBEC
40.0000 mg | DELAYED_RELEASE_TABLET | Freq: Every day | ORAL | Status: DC
  Administered 2021-03-11 – 2021-03-16 (×6): 40 mg via ORAL
  Filled 2021-03-09 (×6): qty 1

## 2021-03-09 MED ORDER — SODIUM CHLORIDE 0.9% FLUSH: 3.0000 mL | INTRAVENOUS | Status: DC | PRN

## 2021-03-09 MED ORDER — ACETAMINOPHEN 500 MG PO TABS
1000.0000 mg | ORAL_TABLET | Freq: Four times a day (QID) | ORAL | Status: AC
  Administered 2021-03-09 – 2021-03-14 (×18): 1000 mg via ORAL
  Filled 2021-03-09 (×21): qty 2

## 2021-03-09 MED ORDER — CHLORHEXIDINE GLUCONATE 4 % EX LIQD: 30.0000 mL | CUTANEOUS | Status: DC

## 2021-03-09 MED ORDER — CHLORHEXIDINE GLUCONATE 0.12 % MT SOLN
15.0000 mL | Freq: Once | OROMUCOSAL | Status: AC
  Administered 2021-03-09: 15 mL via OROMUCOSAL
  Filled 2021-03-09: qty 15

## 2021-03-09 MED ORDER — CHLORHEXIDINE GLUCONATE CLOTH 2 % EX PADS
6.0000 | MEDICATED_PAD | Freq: Every day | CUTANEOUS | Status: DC
  Administered 2021-03-09 – 2021-03-14 (×6): 6 via TOPICAL

## 2021-03-09 MED ORDER — METOPROLOL TARTRATE 25 MG/10 ML ORAL SUSPENSION: 12.5000 mg | Freq: Two times a day (BID) | ORAL | Status: DC

## 2021-03-09 MED ORDER — NITROGLYCERIN IN D5W 200-5 MCG/ML-% IV SOLN: 0.0000 ug/min | INTRAVENOUS | Status: DC

## 2021-03-09 MED ORDER — SODIUM CHLORIDE (PF) 0.9 % IJ SOLN
OROMUCOSAL | Status: DC | PRN
  Administered 2021-03-09 (×2): 4 mL via TOPICAL

## 2021-03-09 MED ORDER — FENTANYL CITRATE (PF) 250 MCG/5ML IJ SOLN
INTRAMUSCULAR | Status: AC
  Filled 2021-03-09: qty 5

## 2021-03-09 MED ORDER — FAMOTIDINE IN NACL 20-0.9 MG/50ML-% IV SOLN
20.0000 mg | Freq: Two times a day (BID) | INTRAVENOUS | Status: AC
  Administered 2021-03-09: 20 mg via INTRAVENOUS
  Filled 2021-03-09: qty 50

## 2021-03-09 MED ORDER — POTASSIUM CHLORIDE 10 MEQ/50ML IV SOLN
10.0000 meq | INTRAVENOUS | Status: AC
  Administered 2021-03-09 (×3): 10 meq via INTRAVENOUS

## 2021-03-09 MED ORDER — OXYCODONE HCL 5 MG PO TABS
5.0000 mg | ORAL_TABLET | ORAL | Status: DC | PRN
  Administered 2021-03-09 – 2021-03-11 (×9): 10 mg via ORAL
  Filled 2021-03-09 (×9): qty 2

## 2021-03-09 MED ORDER — MORPHINE SULFATE (PF) 2 MG/ML IV SOLN: 1.0000 mg | INTRAVENOUS | Status: DC | PRN

## 2021-03-09 MED ORDER — MIDAZOLAM HCL (PF) 5 MG/ML IJ SOLN
INTRAMUSCULAR | Status: DC | PRN
  Administered 2021-03-09 (×2): 1 mg via INTRAVENOUS
  Administered 2021-03-09: 3 mg via INTRAVENOUS
  Administered 2021-03-09: 1 mg via INTRAVENOUS

## 2021-03-09 MED ORDER — ALBUMIN HUMAN 5 % IV SOLN
250.0000 mL | INTRAVENOUS | Status: AC | PRN
  Administered 2021-03-09 (×3): 12.5 g via INTRAVENOUS
  Filled 2021-03-09: qty 250

## 2021-03-09 MED ORDER — DOCUSATE SODIUM 100 MG PO CAPS
200.0000 mg | ORAL_CAPSULE | Freq: Every day | ORAL | Status: DC
  Administered 2021-03-10 – 2021-03-13 (×4): 200 mg via ORAL
  Filled 2021-03-09 (×7): qty 2

## 2021-03-09 MED ORDER — TAMSULOSIN HCL 0.4 MG PO CAPS
0.4000 mg | ORAL_CAPSULE | Freq: Every day | ORAL | Status: DC
  Administered 2021-03-09 – 2021-03-15 (×7): 0.4 mg via ORAL
  Filled 2021-03-09 (×7): qty 1

## 2021-03-09 MED ORDER — PROPOFOL 10 MG/ML IV BOLUS
INTRAVENOUS | Status: AC
  Filled 2021-03-09: qty 20

## 2021-03-09 MED ORDER — ACETAMINOPHEN 650 MG RE SUPP
650.0000 mg | Freq: Once | RECTAL | Status: AC
  Administered 2021-03-09: 650 mg via RECTAL

## 2021-03-09 MED ORDER — EPINEPHRINE HCL 5 MG/250ML IV SOLN IN NS: 0.0000 ug/min | INTRAVENOUS | Status: DC

## 2021-03-09 MED ORDER — ARTIFICIAL TEARS OPHTHALMIC OINT
TOPICAL_OINTMENT | OPHTHALMIC | Status: AC
  Filled 2021-03-09: qty 3.5

## 2021-03-09 MED ORDER — LACTATED RINGERS IV SOLN: INTRAVENOUS | Status: DC | PRN

## 2021-03-09 MED ORDER — PHENYLEPHRINE 40 MCG/ML (10ML) SYRINGE FOR IV PUSH (FOR BLOOD PRESSURE SUPPORT)
PREFILLED_SYRINGE | INTRAVENOUS | Status: DC | PRN
Start: 2021-03-09 — End: 2021-03-09
  Administered 2021-03-09: 80 ug via INTRAVENOUS

## 2021-03-09 MED ORDER — HEPARIN SODIUM (PORCINE) 1000 UNIT/ML IJ SOLN
INTRAMUSCULAR | Status: DC | PRN
  Administered 2021-03-09: 16000 [IU] via INTRAVENOUS

## 2021-03-09 MED ORDER — LIDOCAINE 2% (20 MG/ML) 5 ML SYRINGE
INTRAMUSCULAR | Status: AC
  Filled 2021-03-09: qty 5

## 2021-03-09 MED ORDER — METOPROLOL TARTRATE 5 MG/5ML IV SOLN
2.5000 mg | INTRAVENOUS | Status: DC | PRN
  Administered 2021-03-13: 5 mg via INTRAVENOUS
  Filled 2021-03-09: qty 5

## 2021-03-09 MED ORDER — SODIUM CHLORIDE 0.45 % IV SOLN: INTRAVENOUS | Status: DC | PRN

## 2021-03-09 MED ORDER — SODIUM CHLORIDE 0.9% FLUSH
10.0000 mL | Freq: Two times a day (BID) | INTRAVENOUS | Status: DC
  Administered 2021-03-09 – 2021-03-11 (×5): 10 mL

## 2021-03-09 MED ORDER — LACTATED RINGERS IV SOLN
500.0000 mL | Freq: Once | INTRAVENOUS | Status: AC | PRN
  Administered 2021-03-09: 500 mL via INTRAVENOUS

## 2021-03-09 MED ORDER — METOCLOPRAMIDE HCL 5 MG/ML IJ SOLN
10.0000 mg | Freq: Four times a day (QID) | INTRAMUSCULAR | Status: AC
  Administered 2021-03-09 – 2021-03-10 (×3): 10 mg via INTRAVENOUS
  Filled 2021-03-09 (×3): qty 2

## 2021-03-09 MED ORDER — PLASMA-LYTE A IV SOLN
INTRAVENOUS | Status: DC | PRN
  Administered 2021-03-09: 1000 mL via INTRAVASCULAR

## 2021-03-09 MED ORDER — DEXMEDETOMIDINE HCL IN NACL 400 MCG/100ML IV SOLN
0.0000 ug/kg/h | INTRAVENOUS | Status: DC
  Administered 2021-03-09: 0.5 ug/kg/h via INTRAVENOUS
  Filled 2021-03-09 (×2): qty 100

## 2021-03-09 MED ORDER — DEXTROSE 50 % IV SOLN: 0.0000 mL | INTRAVENOUS | Status: DC | PRN

## 2021-03-09 MED ORDER — CEFAZOLIN SODIUM-DEXTROSE 2-4 GM/100ML-% IV SOLN
2.0000 g | Freq: Three times a day (TID) | INTRAVENOUS | Status: AC
  Administered 2021-03-09 – 2021-03-11 (×6): 2 g via INTRAVENOUS
  Filled 2021-03-09 (×6): qty 100

## 2021-03-09 MED ORDER — ASPIRIN EC 325 MG PO TBEC
325.0000 mg | DELAYED_RELEASE_TABLET | Freq: Every day | ORAL | Status: DC
  Administered 2021-03-10 – 2021-03-16 (×7): 325 mg via ORAL
  Filled 2021-03-09 (×8): qty 1

## 2021-03-09 MED ORDER — MIDAZOLAM HCL 2 MG/2ML IJ SOLN: 2.0000 mg | INTRAMUSCULAR | Status: DC | PRN

## 2021-03-09 MED ORDER — CYCLOBENZAPRINE HCL 10 MG PO TABS
5.0000 mg | ORAL_TABLET | Freq: Three times a day (TID) | ORAL | Status: DC | PRN
  Administered 2021-03-09 – 2021-03-11 (×4): 5 mg via ORAL
  Filled 2021-03-09 (×6): qty 1

## 2021-03-09 MED ORDER — FENTANYL CITRATE (PF) 250 MCG/5ML IJ SOLN
INTRAMUSCULAR | Status: DC | PRN
  Administered 2021-03-09 (×8): 50 ug via INTRAVENOUS
  Administered 2021-03-09: 150 ug via INTRAVENOUS
  Administered 2021-03-09 (×2): 50 ug via INTRAVENOUS

## 2021-03-09 MED ORDER — FENTANYL CITRATE PF 50 MCG/ML IJ SOSY
25.0000 ug | PREFILLED_SYRINGE | INTRAMUSCULAR | Status: DC | PRN
  Administered 2021-03-09 – 2021-03-11 (×5): 25 ug via INTRAVENOUS
  Filled 2021-03-09 (×5): qty 1

## 2021-03-09 MED ORDER — METOPROLOL TARTRATE 12.5 MG HALF TABLET
12.5000 mg | ORAL_TABLET | Freq: Once | ORAL | Status: AC
  Administered 2021-03-09: 12.5 mg via ORAL
  Filled 2021-03-09: qty 1

## 2021-03-09 MED ORDER — ~~LOC~~ CARDIAC SURGERY, PATIENT & FAMILY EDUCATION
Freq: Once | Status: DC
  Filled 2021-03-09: qty 1

## 2021-03-09 MED ORDER — BISACODYL 5 MG PO TBEC
10.0000 mg | DELAYED_RELEASE_TABLET | Freq: Every day | ORAL | Status: DC
  Administered 2021-03-10 – 2021-03-13 (×4): 10 mg via ORAL
  Filled 2021-03-09 (×6): qty 2

## 2021-03-09 MED ORDER — ATROPINE SULFATE 0.4 MG/ML IV SOLN
INTRAVENOUS | Status: DC | PRN
Start: 2021-03-09 — End: 2021-03-09
  Administered 2021-03-09 (×2): .2 mg via INTRAVENOUS

## 2021-03-09 MED ORDER — SODIUM BICARBONATE 8.4 % IV SOLN
100.0000 meq | Freq: Once | INTRAVENOUS | Status: AC
  Administered 2021-03-09: 100 meq via INTRAVENOUS

## 2021-03-09 MED ORDER — PROPOFOL 10 MG/ML IV BOLUS
INTRAVENOUS | Status: DC | PRN
  Administered 2021-03-09: 50 mg via INTRAVENOUS

## 2021-03-09 MED ORDER — ALBUMIN HUMAN 5 % IV SOLN: INTRAVENOUS | Status: DC | PRN

## 2021-03-09 MED ORDER — ATORVASTATIN CALCIUM 10 MG PO TABS: 10.0000 mg | ORAL_TABLET | Freq: Every day | ORAL | Status: DC

## 2021-03-09 MED ORDER — ASPIRIN 81 MG PO CHEW: 324.0000 mg | CHEWABLE_TABLET | Freq: Every day | ORAL | Status: DC

## 2021-03-09 MED ORDER — 0.9 % SODIUM CHLORIDE (POUR BTL) OPTIME
TOPICAL | Status: DC | PRN
  Administered 2021-03-09: 5000 mL

## 2021-03-09 MED ORDER — SODIUM CHLORIDE 0.9 % IV SOLN
INTRAVENOUS | Status: DC
Start: 2021-03-09 — End: 2021-03-15

## 2021-03-09 MED ORDER — ACETAMINOPHEN 160 MG/5ML PO SOLN: 650.0000 mg | Freq: Once | ORAL | Status: AC

## 2021-03-09 MED ORDER — SODIUM CHLORIDE 0.9 % IV SOLN: 250.0000 mL | INTRAVENOUS | Status: DC

## 2021-03-09 MED ORDER — SODIUM CHLORIDE 0.9% FLUSH
3.0000 mL | Freq: Two times a day (BID) | INTRAVENOUS | Status: DC
  Administered 2021-03-10 – 2021-03-11 (×3): 3 mL via INTRAVENOUS

## 2021-03-09 MED ORDER — METOPROLOL TARTRATE 12.5 MG HALF TABLET
12.5000 mg | ORAL_TABLET | Freq: Two times a day (BID) | ORAL | Status: DC
  Administered 2021-03-10 – 2021-03-12 (×5): 12.5 mg via ORAL
  Filled 2021-03-09 (×7): qty 1

## 2021-03-09 MED ORDER — TRAMADOL HCL 50 MG PO TABS
50.0000 mg | ORAL_TABLET | ORAL | Status: DC | PRN
  Administered 2021-03-10 (×3): 100 mg via ORAL
  Filled 2021-03-09 (×3): qty 2

## 2021-03-09 MED ORDER — MIDAZOLAM HCL (PF) 10 MG/2ML IJ SOLN
INTRAMUSCULAR | Status: AC
  Filled 2021-03-09: qty 2

## 2021-03-09 MED ORDER — HEPARIN SODIUM (PORCINE) 1000 UNIT/ML IJ SOLN
INTRAMUSCULAR | Status: AC
  Filled 2021-03-09: qty 20

## 2021-03-09 MED ORDER — VANCOMYCIN HCL IN DEXTROSE 1-5 GM/200ML-% IV SOLN
1000.0000 mg | Freq: Once | INTRAVENOUS | Status: AC
  Administered 2021-03-09: 1000 mg via INTRAVENOUS
  Filled 2021-03-09: qty 200

## 2021-03-09 MED ORDER — ACETAMINOPHEN 160 MG/5ML PO SOLN: 1000.0000 mg | Freq: Four times a day (QID) | ORAL | Status: AC

## 2021-03-09 MED ORDER — PHENYLEPHRINE HCL-NACL 20-0.9 MG/250ML-% IV SOLN: 0.0000 ug/min | INTRAVENOUS | Status: DC

## 2021-03-09 MED ORDER — MAGNESIUM SULFATE 4 GM/100ML IV SOLN
4.0000 g | Freq: Once | INTRAVENOUS | Status: AC
  Administered 2021-03-09: 4 g via INTRAVENOUS
  Filled 2021-03-09: qty 100

## 2021-03-09 MED ORDER — MILRINONE LACTATE IN DEXTROSE 20-5 MG/100ML-% IV SOLN: 0.3000 ug/kg/min | INTRAVENOUS | Status: DC

## 2021-03-09 MED ORDER — ROCURONIUM BROMIDE 10 MG/ML (PF) SYRINGE
PREFILLED_SYRINGE | INTRAVENOUS | Status: DC | PRN
  Administered 2021-03-09: 20 mg via INTRAVENOUS
  Administered 2021-03-09: 100 mg via INTRAVENOUS
  Administered 2021-03-09: 30 mg via INTRAVENOUS
  Administered 2021-03-09: 20 mg via INTRAVENOUS

## 2021-03-09 MED ORDER — INSULIN REGULAR(HUMAN) IN NACL 100-0.9 UT/100ML-% IV SOLN: INTRAVENOUS | Status: DC

## 2021-03-09 MED ORDER — EPHEDRINE SULFATE 50 MG/ML IJ SOLN
INTRAMUSCULAR | Status: DC | PRN
Start: 2021-03-09 — End: 2021-03-09
  Administered 2021-03-09: 5 mg via INTRAVENOUS

## 2021-03-09 SURGICAL SUPPLY — 90 items
ADH SKN CLS APL DERMABOND .7 (GAUZE/BANDAGES/DRESSINGS) ×2
BAG DECANTER FOR FLEXI CONT (MISCELLANEOUS) ×10 IMPLANT
BLADE CLIPPER SURG (BLADE) ×5 IMPLANT
BLADE STERNUM SYSTEM 6 (BLADE) ×5 IMPLANT
BLADE SURG 11 STRL SS (BLADE) ×2 IMPLANT
BLOWER MISTER CAL-MED (MISCELLANEOUS) ×7 IMPLANT
BNDG CMPR MED 10X6 ELC LF (GAUZE/BANDAGES/DRESSINGS) ×2
BNDG ELASTIC 4X5.8 VLCR STR LF (GAUZE/BANDAGES/DRESSINGS) ×5 IMPLANT
BNDG ELASTIC 6X10 VLCR STRL LF (GAUZE/BANDAGES/DRESSINGS) ×2 IMPLANT
BNDG ELASTIC 6X5.8 VLCR STR LF (GAUZE/BANDAGES/DRESSINGS) ×5 IMPLANT
BNDG GAUZE ELAST 4 BULKY (GAUZE/BANDAGES/DRESSINGS) ×5 IMPLANT
CABLE SURGICAL S-101-97-12 (CABLE) IMPLANT
CANISTER SUCT 3000ML PPV (MISCELLANEOUS) ×5 IMPLANT
CANNULA MC2 2 STG 29/37 NON-V (CANNULA) ×3 IMPLANT
CANNULA MC2 TWO STAGE (CANNULA) ×4
CANNULA NON VENT 20FR 12 (CANNULA) ×5 IMPLANT
CANNULA VESSEL 3MM BLUNT TIP (CANNULA) ×2 IMPLANT
CLIP RETRACTION 3.0MM CORONARY (MISCELLANEOUS) ×5 IMPLANT
CLIP VESOCCLUDE MED 24/CT (CLIP) IMPLANT
CLIP VESOCCLUDE SM WIDE 24/CT (CLIP) IMPLANT
CONN ST 1/2X1/2  BEN (MISCELLANEOUS) ×4
CONN ST 1/2X1/2 BEN (MISCELLANEOUS) ×3 IMPLANT
CONNECTOR BLAKE 2:1 CARIO BLK (MISCELLANEOUS) ×2 IMPLANT
CONTAINER PROTECT SURGISLUSH (MISCELLANEOUS) ×2 IMPLANT
DEFOGGER ANTIFOG KIT (MISCELLANEOUS) ×2 IMPLANT
DERMABOND ADVANCED (GAUZE/BANDAGES/DRESSINGS) ×2
DERMABOND ADVANCED .7 DNX12 (GAUZE/BANDAGES/DRESSINGS) IMPLANT
DRAIN CHANNEL 19F RND (DRAIN) IMPLANT
DRAIN CONNECTOR BLAKE 1:1 (MISCELLANEOUS) ×2 IMPLANT
DRAPE CARDIOVASCULAR INCISE (DRAPES) ×4
DRAPE INCISE IOBAN 66X45 STRL (DRAPES) ×5 IMPLANT
DRAPE SRG 135X102X78XABS (DRAPES) ×3 IMPLANT
DRAPE WARM FLUID 44X44 (DRAPES) ×5 IMPLANT
DRSG AQUACEL AG ADV 3.5X10 (GAUZE/BANDAGES/DRESSINGS) ×2 IMPLANT
DRSG COVADERM 4X14 (GAUZE/BANDAGES/DRESSINGS) ×5 IMPLANT
ELECT REM PT RETURN 9FT ADLT (ELECTROSURGICAL) ×8
ELECTRODE REM PT RTRN 9FT ADLT (ELECTROSURGICAL) ×6 IMPLANT
FELT TEFLON 1X6 (MISCELLANEOUS) ×8 IMPLANT
GAUZE 4X4 16PLY ~~LOC~~+RFID DBL (SPONGE) ×5 IMPLANT
GAUZE SPONGE 4X4 12PLY STRL (GAUZE/BANDAGES/DRESSINGS) ×10 IMPLANT
GLOVE SURG ENC MOIS LTX SZ7 (GLOVE) ×10 IMPLANT
GLOVE SURG MICRO LTX SZ6 (GLOVE) ×6 IMPLANT
GLOVE SURG MICRO LTX SZ6.5 (GLOVE) ×8 IMPLANT
GLOVE SURG UNDER POLY LF SZ7.5 (GLOVE) ×10 IMPLANT
GOWN STRL REUS W/ TWL LRG LVL3 (GOWN DISPOSABLE) ×12 IMPLANT
GOWN STRL REUS W/ TWL XL LVL3 (GOWN DISPOSABLE) ×6 IMPLANT
GOWN STRL REUS W/TWL LRG LVL3 (GOWN DISPOSABLE) ×16
GOWN STRL REUS W/TWL XL LVL3 (GOWN DISPOSABLE) ×4
HEMOSTAT POWDER SURGIFOAM 1G (HEMOSTASIS) ×15 IMPLANT
INSERT SUTURE HOLDER (MISCELLANEOUS) ×7 IMPLANT
KIT BASIN OR (CUSTOM PROCEDURE TRAY) ×5 IMPLANT
KIT SUCTION CATH 14FR (SUCTIONS) ×15 IMPLANT
KIT TURNOVER KIT B (KITS) ×5 IMPLANT
KIT VASOVIEW HEMOPRO 2 VH 4000 (KITS) ×5 IMPLANT
LEAD PACING MYOCARDI (MISCELLANEOUS) ×7 IMPLANT
MARKER GRAFT CORONARY BYPASS (MISCELLANEOUS) ×17 IMPLANT
NS IRRIG 1000ML POUR BTL (IV SOLUTION) ×25 IMPLANT
OFFPUMP STABILIZER SUV (MISCELLANEOUS) ×7 IMPLANT
PACK E OPEN HEART (SUTURE) ×5 IMPLANT
PACK OPEN HEART (CUSTOM PROCEDURE TRAY) ×5 IMPLANT
PAD ARMBOARD 7.5X6 YLW CONV (MISCELLANEOUS) ×10 IMPLANT
PAD ELECT DEFIB RADIOL ZOLL (MISCELLANEOUS) ×5 IMPLANT
PENCIL BUTTON HOLSTER BLD 10FT (ELECTRODE) ×5 IMPLANT
POSITIONER ACROBAT-I OFFPUMP (MISCELLANEOUS) ×7 IMPLANT
POSITIONER HEAD DONUT 9IN (MISCELLANEOUS) ×5 IMPLANT
PUNCH AORTIC ROTATE 4.0MM (MISCELLANEOUS) ×2 IMPLANT
PUNCH AORTIC ROTATE 4.5MM 8IN (MISCELLANEOUS) IMPLANT
SPONGE T-LAP 18X18 ~~LOC~~+RFID (SPONGE) ×18 IMPLANT
SUPPORT HEART JANKE-BARRON (MISCELLANEOUS) ×5 IMPLANT
SUT BONE WAX W31G (SUTURE) ×5 IMPLANT
SUT MNCRL AB 3-0 PS2 18 (SUTURE) ×10 IMPLANT
SUT MNCRL AB 4-0 PS2 18 (SUTURE) ×2 IMPLANT
SUT PDS AB 1 CTX 36 (SUTURE) ×10 IMPLANT
SUT PROLENE 3 0 SH DA (SUTURE) ×5 IMPLANT
SUT PROLENE 5 0 C 1 36 (SUTURE) ×17 IMPLANT
SUT PROLENE 7 0 BV 1 (SUTURE) ×2 IMPLANT
SUT PROLENE BLUE 7 0 (SUTURE) ×5 IMPLANT
SUT STEEL 6MS V (SUTURE) ×10 IMPLANT
SUT VIC AB 2-0 CT1 27 (SUTURE) ×4
SUT VIC AB 2-0 CT1 TAPERPNT 27 (SUTURE) IMPLANT
SYSTEM SAHARA CHEST DRAIN ATS (WOUND CARE) ×5 IMPLANT
TAPE CLOTH SURG 4X10 WHT LF (GAUZE/BANDAGES/DRESSINGS) ×2 IMPLANT
TAPE PAPER 2X10 WHT MICROPORE (GAUZE/BANDAGES/DRESSINGS) ×2 IMPLANT
TAPES RETRACTO (MISCELLANEOUS) ×12 IMPLANT
TOWEL GREEN STERILE (TOWEL DISPOSABLE) ×5 IMPLANT
TOWEL GREEN STERILE FF (TOWEL DISPOSABLE) ×5 IMPLANT
TRAY FOLEY SLVR 16FR TEMP STAT (SET/KITS/TRAYS/PACK) ×5 IMPLANT
TUBING LAP HI FLOW INSUFFLATIO (TUBING) ×5 IMPLANT
UNDERPAD 30X36 HEAVY ABSORB (UNDERPADS AND DIAPERS) ×5 IMPLANT
WATER STERILE IRR 1000ML POUR (IV SOLUTION) ×10 IMPLANT

## 2021-03-09 NOTE — Brief Op Note (Addendum)
03/09/2021  3:54 PM  PATIENT:  [name]  81 y.o. male  PRE-OPERATIVE DIAGNOSIS:  coronary artery disease  POST-OPERATIVE DIAGNOSIS:  coronary artery disease  PROCEDURE:  Procedure(s) with comments:  OFF PUMP CORONARY ARTERY BYPASS GRAFTING x 2 -LIMA to LAD -SVG to PDA  ENDOSCOPIC HARVEST GREATER SAPHENOUS VEIN -Right Thigh (15/10)  TRANSESOPHAGEAL ECHOCARDIOGRAM (TEE) (N/A)  SURGEON:  Surgeon(s) and Role:    * Lightfoot, Lucile Crater, MD - Primary  PHYSICIAN ASSISTANT: Ellwood Handler PA-C  ASSISTANTS: Ara Kussmaul RNFA   ANESTHESIA:   general  EBL:  700 mL   BLOOD ADMINISTERED:   CC CELLSAVER  DRAINS:  Left Pleural chest tube, mediastinal chest drain    LOCAL MEDICATIONS USED:  NONE  SPECIMEN:  No Specimen  DISPOSITION OF SPECIMEN:  N/A  COUNTS:  YES  TOURNIQUET:  * No tourniquets in log *  DICTATION: .Dragon Dictation  PLAN OF CARE: Admit to inpatient   PATIENT DISPOSITION:  ICU - intubated and hemodynamically stable.   Delay start of Pharmacological VTE agent (>24hrs) due to surgical blood loss or risk of bleeding: yes

## 2021-03-09 NOTE — H&P (Signed)
[address]       [place] [address]             [place] Record #[id number] Date of Birth: [birth year]   Referring: Lorretta Harp, MD Primary Care: Celene Squibb, MD Primary Cardiologist:Samuel Domenic Polite, MD   Chief Complaint:        Chief Complaint  Patient presents with   Coronary Artery Disease      Surgical consult, Cardiac Cath 01/18/21, ECHO 12/15./22     No events since his last clinic appointment  Vitals:   03/09/21 0618 03/09/21 0633  BP:    Pulse:    Resp: 18   Temp:  98.1 F (36.7 C)  SpO2: 95%    Alert NAD Sinus EWOB  OR today for CABG 2. Lajuana Matte  Per my clinic note   History of Present Illness:     81 year old male referred for surgical evaluation of two-vessel coronary artery disease.  The patient states that he is not having any chest pain or shortness of breath with exertion.  He remains very active.  He is able to climb ladders and do what ever he wants at his house.  He is curious as to why surgery is being considered for him.     Past Medical and Surgical History: Previous Chest Surgery: No Previous Chest Radiation: No Diabetes Mellitus: No.  HbA1C pending Creatinine: 1.68       Past Medical History:  Diagnosis Date   Arthritis     Chronic back pain     CKD (chronic kidney disease) stage 3, GFR 30-59 ml/min (HCC)     Essential hypertension     GERD (gastroesophageal reflux disease)     Helicobacter pylori gastritis      Treated   Hx of adenomatous colonic polyps     IBS (irritable bowel syndrome)     Prostate CA (HCC)      Seed implants   Renal insufficiency     Schatzki's ring             Past Surgical History:  Procedure Laterality Date   APPENDECTOMY   age 89   BACK SURGERY   02/08    [facility]   BACK SURGERY   09/24/04    [facility], [facility]   BACK SURGERY   09/18/02    [facility]   BILIARY STENT PLACEMENT   01/07/2012     Procedure: BILIARY STENT PLACEMENT;  Surgeon: Rogene Houston, MD;  Location: [facility];  Service: Endoscopy;  Laterality: N/A;   BILIARY STENT PLACEMENT   02/21/2012    Procedure: BILIARY STENT PLACEMENT;  Surgeon: Rogene Houston, MD;  Location: [facility];  Service: Endoscopy;  Laterality: N/A;  Biliary Stent Replacement   CATARACT EXTRACTION W/PHACO   11/22/2010    Procedure: CATARACT EXTRACTION PHACO AND INTRAOCULAR LENS PLACEMENT ([place]);  Surgeon: Williams Che;  Location: [facility];  Service: Ophthalmology;  Laterality: Right;  CDE: 6.81   CHOLECYSTECTOMY   03/04/05    [facility], [facility]. Gangrenous cholecystitis complicated by abscess requiring percutaneous drainage. He also had common duct stones requiring ERCP and sphincterotomy.   COLONOSCOPY   August 2005    Scattered sigmoid diverticulosis,  splenic flexure polyp. Hyperplastic   COLONOSCOPY   1997    3 cm tubular adenoma and a sending colon   COLONOSCOPY   02/01/2011    Rourk-friable anal canal, hyperplastic rectal polyp   COLONOSCOPY N/A 08/15/2013    Procedure: COLONOSCOPY;  Surgeon: Rogene Houston, MD;  [facility];  Service: Endoscopy;  Laterality: N/A;  100   COLONOSCOPY N/A 09/20/2018    Procedure: COLONOSCOPY;  Surgeon: Rogene Houston, MD;  [facility];  Service: Endoscopy;  Laterality: N/A;  12:00pm   ERCP   03/02/05    [facility], [facility]. Normal-appearing biliary tree (gallbladder not image), status post sphincterotomy with recovery of small pieces of stone material, status post balloon occlusion cholangiogram.   ERCP   01/07/2012    Procedure: ENDOSCOPIC RETROGRADE CHOLANGIOPANCREATOGRAPHY (ERCP);  Surgeon: Rogene Houston, MD;  Location: [facility];  Service: Endoscopy;  Laterality: N/A;  possible biliary stenting   ERCP   02/21/2012    Procedure: ENDOSCOPIC RETROGRADE CHOLANGIOPANCREATOGRAPHY (ERCP);  Surgeon: Rogene Houston, MD;  Location: [facility];  Service: Endoscopy;  Laterality: N/A;  common bile duct stone and clip  removed   ERCP N/A 05/18/2012    Procedure: ENDOSCOPIC RETROGRADE CHOLANGIOPANCREATOGRAPHY (ERCP);  Surgeon: Rogene Houston, MD;  Location: [facility];  Service: Endoscopy;  Laterality: N/A;  stone and debri removal   ESOPHAGOGASTRODUODENOSCOPY   August 2005    Erosive esophagitis and Schatzki ring, small hiatal hernia   ESOPHAGOGASTRODUODENOSCOPY   01/2008    Mild reflux esophagitis, small hiatal hernia   ESOPHAGOGASTRODUODENOSCOPY   02/01/2011    Rourk-erosive reflux esophagitis, small hiatal hernia   ESOPHAGOGASTRODUODENOSCOPY   10/26/2011    Dr. Eli Phillips gastric submucosal petechia (bx-benign ulceration), juxta ampullary duodenal diverticulum and some mucosal edema involving 1st/2nd portion of duodenum with superficial erosions (bx-superficial ulceration/benign)   ESOPHAGOGASTRODUODENOSCOPY   02/21/2012    Procedure: ESOPHAGOGASTRODUODENOSCOPY (EGD);  Surgeon: Rogene Houston, MD;  Location: [facility];  Service: Endoscopy;  Laterality: N/A;   EYE SURGERY       Incomplete colonoscopy   December 2009    Left-sided diverticula, mid descending colon polyp, due to recurrent looping and redundancy exam was incomplete. It was felt that the mid colon was reached. Followup barium enema showed colon interposition between the liver and the diaphragm, redundant sigmoid colon but no colon mass or polyp identified.. Pathology revealed tubular adenoma.   LEFT HEART CATH AND CORONARY ANGIOGRAPHY N/A 01/18/2021    Procedure: LEFT HEART CATH AND CORONARY ANGIOGRAPHY;  Surgeon: Lorretta Harp, MD;  Location: [facility];  Service: Cardiovascular;  Laterality: N/A;   POLYPECTOMY   09/20/2018    Procedure: POLYPECTOMY;  Surgeon: Rogene Houston, MD;  [facility];  Service: Endoscopy;;  Hepatic flexure polyp cold snare, splenic flexure polyp   SPHINCTEROTOMY   01/07/2012    Procedure: SPHINCTEROTOMY;  Surgeon: Rogene Houston, MD;  Location: [facility];  Service: Endoscopy;  Laterality: N/A;   Extended   SPYGLASS CHOLANGIOSCOPY   02/21/2012    Procedure: SPYGLASS CHOLANGIOSCOPY;  Surgeon: Rogene Houston, MD;  Location: [facility];  Service: Endoscopy;  Laterality: N/A;   SPYGLASS CHOLANGIOSCOPY N/A 05/18/2012    Procedure: SPYGLASS CHOLANGIOSCOPY;  Surgeon: Rogene Houston, MD;  Location: [facility];  Service: Endoscopy;  Laterality: N/A;   TOTAL KNEE ARTHROPLASTY   03/21/2012    Procedure: TOTAL KNEE ARTHROPLASTY;  Surgeon: [name], MD;  Location: [facility];  Service: Orthopedics;  Laterality: Right;  right knee arthroplasty   TOTAL KNEE ARTHROPLASTY Left 04/08/2019    Procedure: LEFT TOTAL KNEE ARTHROPLASTY;  Surgeon: Frederik Pear, MD;  Location: [facility];  Service: Orthopedics;  Laterality: Left;      Social History: Support: Lives with his wife who is quite functional   Social History       Tobacco Use  Smoking Status Never  Smokeless Tobacco Never    Social History        Substance and Sexual Activity  Alcohol Use No   Alcohol/week: 0.0 standard drinks        No Known Allergies             Current Outpatient Medications  Medication Sig Dispense Refill   amLODipine (NORVASC) 5 MG tablet Take 5 mg by mouth at bedtime.       Ascorbic Acid (VITAMIN C) 1000 MG tablet Take 1,000 mg by mouth daily.       aspirin EC 81 MG tablet Take 81 mg by mouth daily. Swallow whole.       atorvastatin (LIPITOR) 10 MG tablet Take 10 mg by mouth daily.       cholecalciferol (VITAMIN D3) 25 MCG (1000 UNIT) tablet Take 1,000 Units by mouth daily.       losartan (COZAAR) 50 MG tablet Take 50 mg by mouth daily.       neomycin-polymyxin-hydrocortisone (CORTISPORIN) 3.5-10000-1 OTIC suspension Place 4 drops into the right ear 3 (three) times daily. 10 mL 0   zinc gluconate 50 MG tablet Take 50 mg by mouth daily.       nitroGLYCERIN (NITROSTAT) 0.4 MG SL tablet Place 0.4 mg under the tongue every 5 (five) minutes as needed for chest pain. (Patient not taking: Reported on 02/05/2021)                  Current Facility-Administered Medications  Medication Dose Route Frequency Provider Last Rate Last Admin   sodium chloride flush (NS) 0.9 % injection 3 mL  3 mL Intravenous Q12H Satira Sark, MD          (Not in a hospital admission)          Family History  Problem Relation Age of Onset   Diabetes Mother     Coronary artery disease Mother     Coronary artery disease Father     Diabetes Father     Stomach cancer Father     Deep vein thrombosis Daughter     Hypotension Neg Hx     Anesthesia problems Neg Hx     Malignant hyperthermia Neg Hx     Pseudochol deficiency Neg Hx     Colon cancer Neg Hx          Review of Systems:    Review of Systems  Constitutional: Negative.   Respiratory: Negative.    Cardiovascular:  Negative for chest pain.  Gastrointestinal: Negative.                  Physical Exam: BP 140/65    Pulse 76    Resp 20    Ht 6\' 2"  (1.88 m)    Wt 250 lb (113.4 kg)    SpO2 95% Comment: RA   BMI 32.10 kg/m  Physical Exam Constitutional:      General: He is not in acute distress.    Appearance: Normal appearance. He is normal weight. He is not ill-appearing.  Cardiovascular:     Rate and  Rhythm: Normal rate and regular rhythm.     Heart sounds: No murmur heard. Pulmonary:     Effort: Pulmonary effort is normal. No respiratory distress.     Breath sounds: Normal breath sounds.  Abdominal:     General: Abdomen is flat.  Musculoskeletal:        General: Normal range of motion.     Cervical back: Normal range of motion.  Skin:    General: Skin is warm and dry.  Neurological:     General: No focal deficit present.     Mental Status: He is alert and oriented to person, place, and time.        Diagnostic Studies & Laboratory data:    Left Heart Catherization: Intervention   Echo:  1. Left ventricular ejection fraction, by visual estimation, is 60 to  65%. The left ventricle has normal function. There is no left ventricular  hypertrophy.    2. Elevated left ventricular end-diastolic pressure.   3. Left ventricular diastolic parameters are consistent with Grade I  diastolic dysfunction (impaired relaxation).   4. Global right ventricle has normal systolic function.The right  ventricular size is normal. No increase in right ventricular wall  thickness.   5. Left atrial size was normal.   6. Right atrial size was normal.   7. Mild aortic valve annular calcification.   8. Mild mitral annular calcification.   9. The mitral valve is grossly normal. Trace mitral valve regurgitation.  10. The tricuspid valve is grossly normal. Tricuspid valve regurgitation  is trivial.  11. The aortic valve is tricuspid. Aortic valve regurgitation is mild.  Mild aortic valve sclerosis without stenosis.  12. There is Mild calcification of the aortic valve.  13. There is Mild thickening of the aortic valve.  14. The pulmonic valve was grossly normal. Pulmonic valve regurgitation is  not visualized.  15. Aortic dilatation noted.  16. There is mild dilatation of the aortic root.  17. Normal pulmonary artery systolic pressure.  18. The interatrial septum was not well visualized.    EKG: Sinus bradycardia with first-degree AV block I have independently reviewed the above radiologic studies and discussed with the patient    Recent Lab Findings: Recent Labs       Lab Results  Component Value Date    WBC 4.5 01/11/2021    HGB 14.5 01/11/2021    HCT 43.0 01/11/2021    PLT 167 01/11/2021    GLUCOSE 96 01/11/2021    GLUCOSE 96 01/11/2021    CHOL 200 11/10/2015    TRIG 208 (H) 11/10/2015    HDL 34 (L) 11/10/2015    LDLCALC 124 11/10/2015    ALT 19 04/30/2018    AST 25 04/30/2018    NA 137 01/11/2021    NA 137 01/11/2021    K 4.3 01/11/2021    K 4.3 01/11/2021    CL 99 01/11/2021    CL 99 01/11/2021    CREATININE 1.69 (H) 01/11/2021    CREATININE 1.69 (H) 01/11/2021    BUN 30 (H) 01/11/2021    BUN 30 (H) 01/11/2021    CO2 30  01/11/2021    CO2 30 01/11/2021    TSH 2.862 01/19/2011    INR 1.0 04/02/2019            Assessment / Plan:   81 year old male with two-vessel coronary artery disease.  I personally reviewed his left heart cath.  He has several tandem lesions within his LAD but there appears  to be a good target.  He also has a mid RCA stenosis but his PDA would be a good target as well.  On his echocardiogram he has no significant valvular disease and preserved biventricular function.  Of note he has chronic kidney disease with a creatinine of 1.69.  Plan for off-pump CABG x2 with LIMA to LAD and saphenous vein graft to PDA.

## 2021-03-09 NOTE — Anesthesia Procedure Notes (Signed)
Arterial Line Insertion Start/End1/17/2023 7:01 AM Performed by: Murvin Natal, MD, Clearnce Sorrel, CRNA, CRNA  Preanesthetic checklist: patient identified, risks and benefits discussed, monitors and equipment checked and timeout performed Lidocaine 1% used for infiltration Left, radial was placed Catheter size: 20 G Hand hygiene performed  and maximum sterile barriers used  Perrow's test indicative of satisfactory collateral circulation Attempts: 1 Procedure performed without using ultrasound guided technique. Additional procedure comments: Performed by Drucie Opitz, SRNA .

## 2021-03-09 NOTE — Transfer of Care (Signed)
Immediate Anesthesia Transfer of Care Note  Patient: [name]  Procedure(s) Performed: OFF PUMP CORONARY ARTERY BYPASS GRAFTING (CABG) X2, USING LEFT INTERNAL MAMMARY ARTERY AND RIGHT LEG GREATER SAPHENOUS VEIN HARVESTED ENDOSCOPICALLY (Chest) TRANSESOPHAGEAL ECHOCARDIOGRAM (TEE)  Patient Location: ICU  Anesthesia Type:General  Level of Consciousness: Patient remains intubated per anesthesia plan  Airway & Oxygen Therapy: Patient remains intubated per anesthesia plan and Patient placed on Ventilator (see vital sign flow sheet for setting)  Post-op Assessment: Report given to RN and Post -op Vital signs reviewed and stable  Post vital signs: Reviewed  Last Vitals:  Vitals Value Taken Time  BP    Temp    Pulse 40 03/09/21 1152  Resp 14 03/09/21 1152  SpO2 95 % 03/09/21 1152    Last Pain:  Vitals:   03/09/21 0633  TempSrc: Oral  PainSc:          Complications: No notable events documented.

## 2021-03-09 NOTE — Anesthesia Procedure Notes (Addendum)
Procedure Name: Intubation Date/Time: 03/09/2021 7:47 AM Performed by: Clearnce Sorrel, CRNA Pre-anesthesia Checklist: Patient identified, Emergency Drugs available, Suction available and Patient being monitored Patient Re-evaluated:Patient Re-evaluated prior to induction Oxygen Delivery Method: Circle System Utilized Preoxygenation: Pre-oxygenation with 100% oxygen Induction Type: IV induction Ventilation: Mask ventilation without difficulty Laryngoscope Size: Mac and 4 Grade View: Grade I Tube type: Oral Tube size: 8.0 mm Number of attempts: 1 Airway Equipment and Method: Stylet and Oral airway Placement Confirmation: ETT inserted through vocal cords under direct vision, positive ETCO2 and breath sounds checked- equal and bilateral Secured at: 22 cm Tube secured with: Tape Dental Injury: Teeth and Oropharynx as per pre-operative assessment  Comments: Intubated by Drucie Opitz, Fernanda Drum

## 2021-03-09 NOTE — Hospital Course (Addendum)
History of Present Illness:  [name] is an 81 yo male with history of CKD, chronic back pain, HTN, IBS, H/O Prostate cancer, and GERD.  The patient developed complaints of shortness of breath with activity.  He denied chest pain but he did have some degree of fatigue.  He was referred to Dr. Domenic Polite by his PCP during which visit EKG was obtained and showed Sinus Bradycardia with a prolonged PR interval, RBBB, and left anterior fascicular block.  He had a previous myoview study in 2019 which showed an inferior wall scar with mild peri-infarct ischemia.  It was felt the patient would benefit from cardiac catheterization to evaluate for possible CAD.  The patient underwent catheterization by Dr. Gwenlyn Found in November ov 2022 which showed 2V CAD.  It was felt coronary bypass grafting would be indicated and he was referred to Dr. Kipp Brood for evaluation.  At that time the patient denied any symptoms.  He is able climb ladders and whatever he wants around the house.  It was felt the patient would benefit from coronary bypass grafting.  The risks and benefits of the procedure were explained to the patient and he was agreeable to proceed.  Hospital Course:  [name] presented to [facility] on 03[facility]09/2021.  He was taken to the operating room and underwent Off Pump CABG x 2 utilizing LIMA to LAD and SVG to PDA.  He also underwent endoscopic harvest of greater saphenous vein from his right thigh.  He tolerated the procedure without difficulty and was taken to the SICU in stable condition.  The was extubated the evening of surgery.  The patient has a history of CKD Stage 3.   He was in NSR and is pacing wires were removed without difficulty.  He subsequently has developed atrial fibrillation.  Due to his underlying bifascicular block cardiology was reconsulted to assist with medical management.  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  He has been started on Eliquis and Plavix has been stopped.  They are also recommending discharge with a 2-week ZIO life monitor for ongoing surveillance for higher degree of AV block.  In addition there is some concern over worsening sinus node dysfunction.  EP will follow as an outpatient and Dr. Miki Kins is arranging this.  His chest tube output decreased and these were removed on 03/11/2021.  The patient was difficult to wean off oxygen.  He was given a dose of IV lasix, which allowed him to wean down to 3L via [place].  He subsequently developed an AKI due to diuretic use.  His creatinine level went up to 2.16.  It began to trend and as he was volume overloaded diuretics were reinitiated.  The patient developed progressive abdominal distention.  He denied N/V.  KUB was obtained and was consistent with the presence of an Ileus.  He was placed on a liquid diet and bowel care was continued.  This has significantly improved over time clinically.  His mobility was limited due to his chronic back pain.  He also developed a significant delirium and behavior was difficult to control.  He required anxiolytics as well as antipsychotics at night.  A sitter was also obtained.  PT consult was obtained and they felt patient would benefit from home health PT.  The patient's delirium resolved.  He has been difficult to wean off oxygen.  CXR was obtained and showed elevation of  the right hemi-diaphragm.  There was no evidence of pleural effusion.  He was started on Lasix for volume overloaded state.  His creatinine level returned to baseline.  A walk test was performed and he will require 1L of oxygen via  at discharge.  ZIO patch has been arranged and placed.  His surgical incisions are healing without evidence of infection.  He is medically stable for discharge home today.

## 2021-03-09 NOTE — Consult Note (Signed)
NAME:  [name], MRN:  [id number], DOB:  Dec 17, 1940, LOS: 0 ADMISSION DATE:  03/09/2021, CONSULTATION DATE:  1/17 REFERRING MD:  Kipp Brood, CHIEF COMPLAINT:  post-op critical care    History of Present Illness:  81 year old male dx'd Nov 2022 w/ severe 2V CAD involving RCA and LAD.  At baseline very active. Still functional prior surgery reporting no limitations from dyspnea or chest pain. Presented to one 1/17 for elective CABG x 2V (off pump).  Surgery completed: CABG X 2.  LIMA LAD, RSVG PDA  Endoscopic greater saphenous vein harvest on the right. Pulmonary asked to evaluate and assist w/ post-op ICU care   Pertinent  Medical History  CKD stage III, HTN, GERD, prior H pylori, IBD, adenomatous polyps of colon, IBS, Prostate cancer Schatzki's ring, prior  appendectomy, prior cholecystectomy  Significant Hospital Events: Including procedures, antibiotic start and stop dates in addition to other pertinent events   1/17 for elective CABG x 2V (off pump).  Surgery completed: CABG X 2.  LIMA LAD, RSVG PDA  Endoscopic greater saphenous vein harvest on the right  Interim History / Subjective:  Sedated   Objective   Blood pressure (Abnormal) 130/52, pulse 63, temperature 98.1 F (36.7 C), temperature source Oral, resp. rate 18, height 6\' 2"  (1.88 m), weight 114 kg, SpO2 95 %.        Intake/Output Summary (Last 24 hours) at 03/09/2021 1201 Last data filed at 03/09/2021 1126 Gross per 24 hour  Intake 3375 ml  Output 1575 ml  Net 1800 ml   Filed Weights   03/09/21 0618  Weight: 114 kg    Examination: General: 81 year old wm sedated on vent  HENT: NCAT no JVD  Lungs: clear  Cardiovascular: RRR Abdomen: soft  Extremities: warm and dry  Neuro: sedated  GU: clear yellow   Resolved Hospital Problem list     Assessment & Plan:  Principal Problem:   S/P CABG x 2 Active Problems:   Chronic renal disease, stage III (HCC)   History of prostate cancer   CAD (coronary artery  disease)   GERD (gastroesophageal reflux disease)   HTN (hypertension)    S/p 2V CABG w/ known h/o severe CAD (LAD and RCA) Plan Wean vasoactive gtts per protocol  Lines/tubes per CVTS Tele w/ demand temp pacer in place  Cont lipitor Asa  Need for mechanical ventilation  Plan Wean sedation  Vent weaning protocol  VAP bundle   CKD stage IIIb Plan Ensure MAP > 65 Renal dose meds Strict I&O Serial chemistries    Best Practice (right click and "Reselect all SmartList Selections" daily)   Diet/type: NPO DVT prophylaxis: SCD GI prophylaxis: PPI Lines: Central line and Arterial Line Foley:  Yes, and it is still needed Code Status:  full code Last date of multidisciplinary goals of care discussion [per primary ]  Labs   CBC: Recent Labs  Lab 03/05/21 1432 03/09/21 0755 03/09/21 0911 03/09/21 1045  WBC 5.3  --   --   --   HGB 15.0 12.6* 10.5* 10.9*  HCT 45.6 37.0* 31.0* 32.0*  MCV 97.2  --   --   --   PLT 152  --   --   --     Basic Metabolic Panel: Recent Labs  Lab 03/05/21 1432 03/09/21 0755 03/09/21 0911 03/09/21 1045  NA 137 139 139 139  K 4.3 3.8 3.7 3.8  CL 102 102 104 104  CO2 22  --   --   --  GLUCOSE 111* 125* 111* 138*  BUN 30* 31* 29* 28*  CREATININE 1.46* 1.70* 1.50* 1.40*  CALCIUM 9.5  --   --   --    GFR: Estimated Creatinine Clearance: 55.5 mL/min (A) (by C-G formula based on SCr of 1.4 mg/dL (H)). Recent Labs  Lab 03/05/21 1432  WBC 5.3    Liver Function Tests: Recent Labs  Lab 03/05/21 1432  AST 29  ALT 28  ALKPHOS 59  BILITOT 0.8  PROT 7.1  ALBUMIN 3.9   No results for input(s): LIPASE, AMYLASE in the last 168 hours. No results for input(s): AMMONIA in the last 168 hours.  ABG    Component Value Date/Time   PHART 7.379 03/05/2021 1433   PCO2ART 43.9 03/05/2021 1433   PO2ART 84.9 03/05/2021 1433   HCO3 25.3 03/05/2021 1433   TCO2 25 03/09/2021 1045   O2SAT 93.7 03/05/2021 1433     Coagulation  Profile: Recent Labs  Lab 03/05/21 1432  INR 1.0    Cardiac Enzymes: No results for input(s): CKTOTAL, CKMB, CKMBINDEX, TROPONINI in the last 168 hours.  HbA1C: Hgb A1c MFr Bld  Date/Time Value Ref Range Status  03/05/2021 02:33 PM 5.7 (H) 4.8 - 5.6 % Final    Comment:    (NOTE) Pre diabetes:          5.7%-6.4%  Diabetes:              >6.4%  Glycemic control for   <7.0% adults with diabetes     CBG: No results for input(s): GLUCAP in the last 168 hours.  Review of Systems:   Not able   Past Medical History:  He,  has a past medical history of Arthritis, CAD (coronary artery disease), Chronic back pain, CKD (chronic kidney disease) stage 3, GFR 30-59 ml/min (HCC), Dyspnea, Essential hypertension, GERD (gastroesophageal reflux disease), Helicobacter pylori gastritis, History of kidney stones, adenomatous colonic polyps, IBS (irritable bowel syndrome), Prostate CA ([place]), Renal insufficiency, Schatzki's ring, and Stroke ([place]).   Surgical History:   Past Surgical History:  Procedure Laterality Date   APPENDECTOMY  age 48   BACK SURGERY  02/08   [facility]   BACK SURGERY  09/24/04   lumbar, [facility]   BACK SURGERY  09/18/02   [facility]   BILIARY STENT PLACEMENT  01/07/2012   Procedure: BILIARY STENT PLACEMENT;  Surgeon: Rogene Houston, MD;  Location: [facility];  Service: Endoscopy;  Laterality: N/A;   BILIARY STENT PLACEMENT  02/21/2012   Procedure: BILIARY STENT PLACEMENT;  Surgeon: Rogene Houston, MD;  Location: [facility];  Service: Endoscopy;  Laterality: N/A;  Biliary Stent Replacement   CATARACT EXTRACTION W/PHACO  11/22/2010   Procedure: CATARACT EXTRACTION PHACO AND INTRAOCULAR LENS PLACEMENT ([place]);  Surgeon: Williams Che;  Location: [facility];  Service: Ophthalmology;  Laterality: Right;  CDE: 6.81   CHOLECYSTECTOMY  03/04/05   [facility], [facility]. Gangrenous cholecystitis complicated by abscess requiring percutaneous drainage. He also had common duct stones requiring ERCP and  sphincterotomy.   COLONOSCOPY  August 2005   Scattered sigmoid diverticulosis, splenic flexure polyp. Hyperplastic   COLONOSCOPY  1997   3 cm tubular adenoma and a sending colon   COLONOSCOPY  02/01/2011   Rourk-friable anal canal, hyperplastic rectal polyp   COLONOSCOPY N/A 08/15/2013   Procedure: COLONOSCOPY;  Surgeon: Rogene Houston, MD;  [facility]: [facility];  Service: Endoscopy;  Laterality: N/A;  100   COLONOSCOPY N/A 09/20/2018   Procedure: COLONOSCOPY;  Surgeon: Hildred Laser  U, MD;  Location: [facility];  Service: Endoscopy;  Laterality: N/A;  12:00pm   ERCP  03/02/05   [facility], [facility]. Normal-appearing biliary tree (gallbladder not image), status post sphincterotomy with recovery of small pieces of stone material, status post balloon occlusion cholangiogram.   ERCP  01/07/2012   Procedure: ENDOSCOPIC RETROGRADE CHOLANGIOPANCREATOGRAPHY (ERCP);  Surgeon: Rogene Houston, MD;  Location: [facility];  Service: Endoscopy;  Laterality: N/A;  possible biliary stenting   ERCP  02/21/2012   Procedure: ENDOSCOPIC RETROGRADE CHOLANGIOPANCREATOGRAPHY (ERCP);  Surgeon: Rogene Houston, MD;  Location: [facility];  Service: Endoscopy;  Laterality: N/A;  common bile duct stone and clip removed   ERCP N/A 05/18/2012   Procedure: ENDOSCOPIC RETROGRADE CHOLANGIOPANCREATOGRAPHY (ERCP);  Surgeon: Rogene Houston, MD;  Location: [facility];  Service: Endoscopy;  Laterality: N/A;  stone and debri removal   ESOPHAGOGASTRODUODENOSCOPY  August 2005   Erosive esophagitis and Schatzki ring, small hiatal hernia   ESOPHAGOGASTRODUODENOSCOPY  01/2008   Mild reflux esophagitis, small hiatal hernia   ESOPHAGOGASTRODUODENOSCOPY  02/01/2011   Rourk-erosive reflux esophagitis, small hiatal hernia   ESOPHAGOGASTRODUODENOSCOPY  10/26/2011   Dr. Eli Phillips gastric submucosal petechia (bx-benign ulceration), juxta ampullary duodenal diverticulum and some mucosal edema involving 1st/2nd portion of duodenum with superficial  erosions (bx-superficial ulceration/benign)   ESOPHAGOGASTRODUODENOSCOPY  02/21/2012   Procedure: ESOPHAGOGASTRODUODENOSCOPY (EGD);  Surgeon: Rogene Houston, MD;  Location: [facility];  Service: Endoscopy;  Laterality: N/A;   EYE SURGERY     Incomplete colonoscopy  December 2009   Left-sided diverticula, mid descending colon polyp, due to recurrent looping and redundancy exam was incomplete. It was felt that the mid colon was reached. Followup barium enema showed colon interposition between the liver and the diaphragm, redundant sigmoid colon but no colon mass or polyp identified.. Pathology revealed tubular adenoma.   LEFT HEART CATH AND CORONARY ANGIOGRAPHY N/A 01/18/2021   Procedure: LEFT HEART CATH AND CORONARY ANGIOGRAPHY;  Surgeon: Lorretta Harp, MD;  Location: [facility];  Service: Cardiovascular;  Laterality: N/A;   POLYPECTOMY  09/20/2018   Procedure: POLYPECTOMY;  Surgeon: Rogene Houston, MD;  [facility];  Service: Endoscopy;;  Hepatic flexure polyp cold snare, splenic flexure polyp   SPHINCTEROTOMY  01/07/2012   Procedure: SPHINCTEROTOMY;  Surgeon: Rogene Houston, MD;  Location: [facility];  Service: Endoscopy;  Laterality: N/A;  Extended   SPYGLASS CHOLANGIOSCOPY  02/21/2012   Procedure: SPYGLASS CHOLANGIOSCOPY;  Surgeon: Rogene Houston, MD;  Location: [facility];  Service: Endoscopy;  Laterality: N/A;   SPYGLASS CHOLANGIOSCOPY N/A 05/18/2012   Procedure: SPYGLASS CHOLANGIOSCOPY;  Surgeon: Rogene Houston, MD;  Location: [facility];  Service: Endoscopy;  Laterality: N/A;   TOTAL KNEE ARTHROPLASTY  03/21/2012   Procedure: TOTAL KNEE ARTHROPLASTY;  Surgeon: [name], MD;  Location: [facility];  Service: Orthopedics;  Laterality: Right;  right knee arthroplasty   TOTAL KNEE ARTHROPLASTY Left 04/08/2019   Procedure: LEFT TOTAL KNEE ARTHROPLASTY;  Surgeon: Frederik Pear, MD;  Location: [facility];  Service: Orthopedics;  Laterality: Left;     Social History:   reports that he has  never smoked. He has never used smokeless tobacco. He reports that he does not drink alcohol and does not use drugs.   Family History:  His family history includes Coronary artery disease in his father and mother; Deep vein thrombosis in his daughter; Diabetes in his father and mother; Stomach cancer in his father. There is no history of Hypotension, Anesthesia  problems, Malignant hyperthermia, Pseudochol deficiency, or Colon cancer.   Allergies No Known Allergies   Home Medications  Prior to Admission medications   Medication Sig Start Date End Date Taking? Authorizing Provider  amLODipine (NORVASC) 5 MG tablet Take 5 mg by mouth at bedtime. 01/04/21  Yes [provider]  Ascorbic Acid (VITAMIN C) 1000 MG tablet Take 1,000 mg by mouth daily.   Yes [provider]  aspirin EC 81 MG tablet Take 81 mg by mouth daily. Swallow whole.   Yes [provider]  atorvastatin (LIPITOR) 10 MG tablet Take 10 mg by mouth daily.   Yes [provider]  cholecalciferol (VITAMIN D3) 25 MCG (1000 UNIT) tablet Take 1,000 Units by mouth daily.   Yes [provider]  losartan (COZAAR) 50 MG tablet Take 50 mg by mouth daily. 10/14/20  Yes [provider]  zinc gluconate 50 MG tablet Take 50 mg by mouth daily.   Yes [provider]  neomycin-polymyxin-hydrocortisone (CORTISPORIN) 3.5-10000-1 OTIC suspension Place 4 drops into the right ear 3 (three) times daily. 09/23/20   Vanessa Kick, MD     Critical care time:    Erick Colace ACNP-BC [place] Pager # [phone] OR # [phone] if no answer

## 2021-03-09 NOTE — Anesthesia Postprocedure Evaluation (Signed)
Anesthesia Post Note  Patient: GABRIELA GIANNELLI  Procedure(s) Performed: OFF PUMP CORONARY ARTERY BYPASS GRAFTING (CABG) X2, USING LEFT INTERNAL MAMMARY ARTERY AND RIGHT LEG GREATER SAPHENOUS VEIN HARVESTED ENDOSCOPICALLY (Chest) TRANSESOPHAGEAL ECHOCARDIOGRAM (TEE)     Patient location during evaluation: ICU Anesthesia Type: General Level of consciousness: awake Pain management: pain level controlled Vital Signs Assessment: post-procedure vital signs reviewed and stable Respiratory status: spontaneous breathing, nonlabored ventilation and respiratory function stable Cardiovascular status: blood pressure returned to baseline and stable Postop Assessment: no apparent nausea or vomiting Anesthetic complications: no   No notable events documented.  Last Vitals:  Vitals:   03/09/21 1900 03/09/21 2000  BP: (!) 122/53 (!) 102/58  Pulse: 82 79  Resp: 18 (!) 25  Temp: (!) 38.2 C (!) 38.2 C  SpO2: 93% 92%    Last Pain:  Vitals:   03/09/21 2000  TempSrc: Bladder  PainSc: 8                  Taro Hidrogo P Fareeha Evon

## 2021-03-09 NOTE — Procedures (Addendum)
Extubation Procedure Note  Patient Details:   Name: [name] DOB: Nov 24, 1940 MRN: [id number]   Airway Documentation:    Vent end date: 03/09/21 Vent end time: 1740   Evaluation  O2 sats: stable throughout Complications: No apparent complications Patient did tolerate procedure well. Bilateral Breath Sounds: Clear, Diminished   Yes,  Per protocol pt was extubated to [facility]. Prior to extubation pt did have a positive cuff leak. NIF= -30 VC= .9L with good pt effort. Pt was able to state his name. No stridor noted and no complications. RN of pt at bedside. RT will continue to monitor pt.  Jorje Guild 03/09/2021, 5:41 PM

## 2021-03-09 NOTE — Anesthesia Preprocedure Evaluation (Addendum)
Anesthesia Evaluation  Patient identified by MRN, date of birth, ID band Patient awake    Reviewed: Allergy & Precautions, NPO status , Patient's Chart, lab work & pertinent test results  Airway Mallampati: II  TM Distance: >3 FB Neck ROM: Full    Dental  (+) Missing, Chipped, Poor Dentition,    Pulmonary neg pulmonary ROS,    Pulmonary exam normal breath sounds clear to auscultation       Cardiovascular hypertension, Pt. on medications + CAD and +CHF  Normal cardiovascular exam Rhythm:Regular Rate:Normal  ECHO: Left ventricular ejection fraction, by estimation, is 50 to 55%. The left ventricle has low normal function. The left ventricle has no regional wall motion abnormalities. There is mild left ventricular hypertrophy of the basal-septal segment. Left ventricular diastolic parameters are consistent with Grade I diastolic dysfunction (impaired relaxation). Elevated left atrial pressure. Right ventricular systolic function is normal. The right ventricular size is normal. Left atrial size was mildly dilated. The mitral valve is normal in structure. No evidence of mitral valve regurgitation. No evidence of mitral stenosis. The aortic valve is calcified. Aortic valve regurgitation is mild. No aortic stenosis is present. Aortic dilatation noted. There is borderline dilatation of the aortic root, measuring 38 mm.   Neuro/Psych CVA, No Residual Symptoms negative psych ROS   GI/Hepatic Neg liver ROS, Schatzki's ring   Endo/Other  negative endocrine ROS  Renal/GU Renal disease     Musculoskeletal  (+) Arthritis ,   Abdominal (+) + obese,   Peds  Hematology negative hematology ROS (+)   Anesthesia Other Findings CAD  Reproductive/Obstetrics                           Anesthesia Physical Anesthesia Plan  ASA: 4  Anesthesia Plan: General   Post-op Pain Management:    Induction:  Intravenous  PONV Risk Score and Plan: 2 and Ondansetron, Dexamethasone, Midazolam and Treatment may vary due to age or medical condition  Airway Management Planned: Oral ETT  Additional Equipment: Arterial line, CVP and Ultrasound Guidance Line Placement  Intra-op Plan:   Post-operative Plan: Post-operative intubation/ventilation  Informed Consent: I have reviewed the patients History and Physical, chart, labs and discussed the procedure including the risks, benefits and alternatives for the proposed anesthesia with the patient or authorized representative who has indicated his/her understanding and acceptance.     Dental advisory given  Plan Discussed with: CRNA  Anesthesia Plan Comments:        Anesthesia Quick Evaluation

## 2021-03-09 NOTE — Anesthesia Procedure Notes (Signed)
Central Venous Catheter Insertion Performed by: Murvin Natal, MD, anesthesiologist Start/End1/17/2023 6:45 AM, 03/09/2021 7:00 AM Patient location: Pre-op. Preanesthetic checklist: patient identified, IV checked, site marked, risks and benefits discussed, surgical consent, monitors and equipment checked, pre-op evaluation, timeout performed and anesthesia consent Position: Trendelenburg Lidocaine 1% used for infiltration and patient sedated Hand hygiene performed , maximum sterile barriers used  and Seldinger technique used Catheter size: 8.5 Fr Total catheter length 10. Sheath introducer Procedure performed using ultrasound guided technique. Ultrasound Notes:anatomy identified, needle tip was noted to be adjacent to the nerve/plexus identified, no ultrasound evidence of intravascular and/or intraneural injection and image(s) printed for medical record Attempts: 1 Following insertion, line sutured, dressing applied and Biopatch. Post procedure assessment: blood return through all ports, free fluid flow and no air  Patient tolerated the procedure well with no immediate complications. Additional procedure comments: Multi access catheter inserted.

## 2021-03-09 NOTE — Op Note (Signed)
[address]       [place] [address]             [phone]                                         03/09/2021    Patient:  Dian Situ Pre-Op Dx: Jerl Santos CAD Stable Angina HTN  HLP   Post-op Dx:  same Procedure: Off pump CABG X 2.  LIMA LAD, RSVG PDA   Endoscopic greater saphenous vein harvest on the right   Surgeon and Role:      * Maely Clements, Lucile Crater, MD - Primary    * E. Barrett, PA-C - assisting with vein harvest, and anastamosis   Anesthesia  general EBL:  475ml Blood Administration: none  Drains: 48 F blake drain:  R, L, mediastinal  Wires: atrial Counts: correct   Indications: 81 year old male with two-vessel coronary artery disease.  I personally reviewed his left heart cath.  He has several tandem lesions within his LAD but there appears to be a good target.  He also has a mid RCA stenosis but his PDA would be a good target as well.  On his echocardiogram he has no significant valvular disease and preserved biventricular function.  Of note he has chronic kidney disease with a creatinine of 1.69.  Plan for off-pump CABG x2 with LIMA to LAD and saphenous vein graft to PDA. Findings: Small LIMA, good vein.  Good LAD, and PDA.   Operative Technique: After the risks, benefits and alternatives were thoroughly discussed, the patient was brought to the operative theatre.  All invasive lines were placed in pre-op holding.  Anesthesia was induced, and the patient was prepped and draped in normal sterile fashion.  An appropriate surgical pause was performed, and pre-operative antibiotics were dosed accordingly.  We began with simultaneous incisions along the right leg for harvesting of the greater saphenous vein and the chest for the sternotomy.  In regards to the sternotomy, this was carried down with bovie cautery, and the sternum was divided with a reciprocating saw.  Meticulous hemostasis was obtained.  The left internal thoracic artery was exposed  and harvested in in pedicled fashion.  The patient was systemically heparinized, and the artery was divided distally, and placed in a papaverine sponge.    The sternal elevator was removed, and a retractor was placed.  The pericardium was divided in the midline and fashioned into a cradle with pericardial stitches.  Next, we exposed a good target on the  LAD, and fashioned an end to side anastomosis between it and the LITA.  We next exposed the posterior wall and identified a good target on PDA.  An end to side anastomosis between it and reverse saphenous vein.  Meticulous hemostasis was obtained.    A partial occludding clamp was then placed on the ascending aorta, and we created an end to side anastomosis between it and the proximal vein grafts.  The proximal site was marked with a ring.  Hemostasis was obtained, and the heparin was reversed with protamine.  Chest tubes and wires were placed, and the sternum was re-approximated with with sternal wires.  The soft tissue and skin were re-approximated wth absorbable suture.    The patient tolerated the procedure without any immediate complications, and was transferred to the ICU in guarded condition.  Tera Mater  Bary Leriche

## 2021-03-09 NOTE — Progress Notes (Signed)
° °   °  [address]       ,[place] [address]             [phone]      S/p CABG x 2  Extubated C/o back pain  BP 99/62    Pulse 80    Temp 100 F (37.8 C)    Resp 19    Ht 6\' 2"  (1.88 m)    Wt 114 kg    SpO2 93%    BMI 32.27 kg/m  Ci = 3.1 Intake/Output Summary (Last 24 hours) at 03/09/2021 1828 Last data filed at 03/09/2021 1800 Gross per 24 hour  Intake 4794.58 ml  Output 2612 ml  Net 2182.58 ml   CBG mildly elevated No other recent labs  [name] C. Roxan Hockey, MD [facility] [phone]

## 2021-03-10 ENCOUNTER — Inpatient Hospital Stay (HOSPITAL_COMMUNITY): Payer: PPO

## 2021-03-10 ENCOUNTER — Encounter (HOSPITAL_COMMUNITY): Payer: Self-pay | Admitting: Thoracic Surgery (Cardiothoracic Vascular Surgery)

## 2021-03-10 DIAGNOSIS — J9811 Atelectasis: Secondary | ICD-10-CM

## 2021-03-10 DIAGNOSIS — D62 Acute posthemorrhagic anemia: Secondary | ICD-10-CM

## 2021-03-10 LAB — BASIC METABOLIC PANEL
Anion gap: 6 (ref 5–15)
Anion gap: 8 (ref 5–15)
BUN: 25 mg/dL — ABNORMAL HIGH (ref 8–23)
BUN: 23 mg/dL (ref 8–23)
CO2: 24 mmol/L (ref 22–32)
CO2: 26 mmol/L (ref 22–32)
Calcium: 7.9 mg/dL — ABNORMAL LOW (ref 8.9–10.3)
Calcium: 8.1 mg/dL — ABNORMAL LOW (ref 8.9–10.3)
Chloride: 107 mmol/L (ref 98–111)
Chloride: 101 mmol/L (ref 98–111)
Creatinine, Ser: 1.48 mg/dL — ABNORMAL HIGH (ref 0.61–1.24)
Creatinine, Ser: 1.54 mg/dL — ABNORMAL HIGH (ref 0.61–1.24)
GFR, Estimated: 47 mL/min — ABNORMAL LOW (ref >60)
GFR, Estimated: 45 mL/min — ABNORMAL LOW (ref >60)
Glucose, Bld: 133 mg/dL — ABNORMAL HIGH (ref 70–99)
Glucose, Bld: 120 mg/dL — ABNORMAL HIGH (ref 70–99)
Potassium: 4.2 mmol/L (ref 3.5–5.1)
Potassium: 4 mmol/L (ref 3.5–5.1)
Sodium: 137 mmol/L (ref 135–145)
Sodium: 135 mmol/L (ref 135–145)

## 2021-03-10 LAB — CBC
HCT: 33.1 % — ABNORMAL LOW (ref 39.0–52.0)
HCT: 33.8 % — ABNORMAL LOW (ref 39.0–52.0)
Hemoglobin: 10.8 g/dL — ABNORMAL LOW (ref 13.0–17.0)
Hemoglobin: 10.8 g/dL — ABNORMAL LOW (ref 13.0–17.0)
MCH: 31.9 pg (ref 26.0–34.0)
MCH: 32 pg (ref 26.0–34.0)
MCHC: 32.6 g/dL (ref 30.0–36.0)
MCHC: 32 g/dL (ref 30.0–36.0)
MCV: 97.6 fL (ref 80.0–100.0)
MCV: 100 fL (ref 80.0–100.0)
Platelets: 123 10*3/uL — ABNORMAL LOW (ref 150–400)
Platelets: 119 10*3/uL — ABNORMAL LOW (ref 150–400)
RBC: 3.39 MIL/uL — ABNORMAL LOW (ref 4.22–5.81)
RBC: 3.38 MIL/uL — ABNORMAL LOW (ref 4.22–5.81)
RDW: 12.9 % (ref 11.5–15.5)
RDW: 13.3 % (ref 11.5–15.5)
WBC: 8.1 10*3/uL (ref 4.0–10.5)
WBC: 8.4 10*3/uL (ref 4.0–10.5)
nRBC: 0 % (ref 0.0–0.2)
nRBC: 0 % (ref 0.0–0.2)

## 2021-03-10 LAB — GLUCOSE, CAPILLARY
Glucose-Capillary: 144 mg/dL — ABNORMAL HIGH (ref 70–99)
Glucose-Capillary: 128 mg/dL — ABNORMAL HIGH (ref 70–99)
Glucose-Capillary: 131 mg/dL — ABNORMAL HIGH (ref 70–99)
Glucose-Capillary: 123 mg/dL — ABNORMAL HIGH (ref 70–99)
Glucose-Capillary: 132 mg/dL — ABNORMAL HIGH (ref 70–99)
Glucose-Capillary: 133 mg/dL — ABNORMAL HIGH (ref 70–99)
Glucose-Capillary: 130 mg/dL — ABNORMAL HIGH (ref 70–99)
Glucose-Capillary: 121 mg/dL — ABNORMAL HIGH (ref 70–99)
Glucose-Capillary: 139 mg/dL — ABNORMAL HIGH (ref 70–99)

## 2021-03-10 LAB — MAGNESIUM
Magnesium: 2.3 mg/dL (ref 1.7–2.4)
Magnesium: 2.3 mg/dL (ref 1.7–2.4)

## 2021-03-10 MED ORDER — GABAPENTIN 100 MG PO CAPS
200.0000 mg | ORAL_CAPSULE | Freq: Two times a day (BID) | ORAL | Status: AC
  Administered 2021-03-10 – 2021-03-12 (×6): 200 mg via ORAL
  Filled 2021-03-10 (×7): qty 2

## 2021-03-10 MED ORDER — INSULIN ASPART 100 UNIT/ML IJ SOLN: 0.0000 [IU] | INTRAMUSCULAR | Status: DC

## 2021-03-10 MED ORDER — INSULIN ASPART 100 UNIT/ML IJ SOLN
0.0000 [IU] | Freq: Three times a day (TID) | INTRAMUSCULAR | Status: DC
  Administered 2021-03-11 – 2021-03-12 (×4): 2 [IU] via SUBCUTANEOUS

## 2021-03-10 MED ORDER — INSULIN ASPART 100 UNIT/ML IJ SOLN
0.0000 [IU] | INTRAMUSCULAR | Status: DC
  Administered 2021-03-10 (×4): 2 [IU] via SUBCUTANEOUS

## 2021-03-10 MED ORDER — LIDOCAINE 5 % EX PTCH
2.0000 | MEDICATED_PATCH | CUTANEOUS | Status: DC
  Administered 2021-03-10 – 2021-03-16 (×7): 2 via TRANSDERMAL
  Filled 2021-03-10 (×7): qty 2

## 2021-03-10 MED ORDER — CLOPIDOGREL BISULFATE 75 MG PO TABS
75.0000 mg | ORAL_TABLET | Freq: Every day | ORAL | Status: DC
  Administered 2021-03-10 – 2021-03-13 (×4): 75 mg via ORAL
  Filled 2021-03-10 (×5): qty 1

## 2021-03-10 MED ORDER — ATORVASTATIN CALCIUM 40 MG PO TABS
40.0000 mg | ORAL_TABLET | Freq: Every day | ORAL | Status: DC
  Administered 2021-03-10 – 2021-03-16 (×7): 40 mg via ORAL
  Filled 2021-03-10 (×8): qty 1

## 2021-03-10 MED ORDER — ENOXAPARIN SODIUM 30 MG/0.3ML IJ SOSY
30.0000 mg | PREFILLED_SYRINGE | Freq: Every day | INTRAMUSCULAR | Status: DC
  Administered 2021-03-10 – 2021-03-12 (×3): 30 mg via SUBCUTANEOUS
  Filled 2021-03-10 (×3): qty 0.3

## 2021-03-10 NOTE — Care Management (Signed)
°  Transition of Care [facility]) Screening Note   Patient Details  Name: [name] Date of Birth: [birth year]   Transition of Care [facility]) CM/SW Contact:    Bethena Roys, RN Phone Number: 03/10/2021, 4:40 PM    Transition of Care Department [facility]) has reviewed the patient and no TOC needs have been identified at this time. Case Manager spoke with patient and he states prior to arrival he was independent from home with spouse. Case Manager will continue to follow for disposition needs as the patient progresses.

## 2021-03-10 NOTE — Consult Note (Signed)
NAME:  [name], MRN:  [id number], DOB:  1940/03/22, LOS: 1 ADMISSION DATE:  03/09/2021, CONSULTATION DATE:  1/17 REFERRING MD:  Kipp Brood, CHIEF COMPLAINT:  post-op critical care    History of Present Illness:  81 year old male dx'd Nov 2022 w/ severe 2V CAD involving RCA and LAD.  At baseline very active. Still functional prior surgery reporting no limitations from dyspnea or chest pain. Presented to one 1/17 for elective CABG x 2V (off pump).  Surgery completed: CABG X 2.  LIMA LAD, RSVG PDA  Endoscopic greater saphenous vein harvest on the right. Pulmonary asked to evaluate and assist w/ post-op ICU care   Pertinent  Medical History  CKD stage III, HTN, GERD, prior H pylori, IBD, adenomatous polyps of colon, IBS, Prostate cancer Schatzki's ring, prior  appendectomy, prior cholecystectomy  Significant Hospital Events: Including procedures, antibiotic start and stop dates in addition to other pertinent events   1/17 for elective CABG x 2V (off pump).  Surgery completed: CABG X 2.  LIMA LAD, RSVG PDA  Endoscopic greater saphenous vein harvest on the right 1/17 fast track extubation .   Interim History / Subjective:    Extubated and off pressors.  Complains of chest pain.   Objective   Blood pressure (!) 110/53, pulse 72, temperature 99.3 F (37.4 C), resp. rate (!) 26, height 6\' 2"  (1.88 m), weight 113.9 kg, SpO2 92 %. PAP: (10-24)/(7-18) 10/8 CVP:  [5 mmHg-18 mmHg] 8 mmHg  Vent Mode: PSV;CPAP FiO2 (%):  [40 %-50 %] 40 % Set Rate:  [4 bmp-12 bmp] 4 bmp Vt Set:  [650 mL] 650 mL PEEP:  [5 cmH20] 5 cmH20 Pressure Support:  [10 cmH20] 10 cmH20 Plateau Pressure:  [14 cmH20-19 cmH20] 19 cmH20   Intake/Output Summary (Last 24 hours) at 03/10/2021 0800 Last data filed at 03/10/2021 0600 Gross per 24 hour  Intake 5775.4 ml  Output 3522 ml  Net 2253.4 ml    Filed Weights   03/09/21 0618 03/10/21 0500  Weight: 114 kg 113.9 kg    Examination: General: 81 year old wm in bed and  comfortable.  HENT: NCAT no JVD  Lungs: clear, splinting with deep breathing. Marginal saturation which improve with IS Cardiovascular: RRR, not pacing. No rub. Minimal drainage  Abdomen: soft  Extremities: warm and dry  Neuro: intact.  GU: clear yellow   Ancillary tests personally reviewed:   CXR 1/18 : low lung volumes and bibasilar atelectasis.  Creatinine stable: 1.48 Expecte  Assessment & Plan:  Principal Problem:   S/P CABG x 2 Active Problems:   Chronic renal disease, stage III (HCC)   History of prostate cancer   CAD (coronary artery disease)   GERD (gastroesophageal reflux disease)   HTN (hypertension)   Acute postoperative anemia due to expected blood loss   Bilateral atelectasis  Plan:   - Improve pain control. Encourage use of oral analgesics, will add gabapentin for co-analgesia.  - progressive ambulation and encourage incentive spirometry. - Clinically euvolemic, no diuresis, follow creatinine  - Progress per care pathway - remove lines and drains - Appropriate to transition to  insulin - Initiate secondary cardiac prevention: betablocker, increase atorvastatin to 40mg , Plavix. Losartan to restart closer to discharge.   Best Practice (right click and "Reselect all SmartList Selections" daily)   Diet/type: Regular consistency (see orders) DVT prophylaxis: SCD - start heparin tomorrow GI prophylaxis: PPI Lines: Central line and Arterial Line can be removed.  Foley:  Yes, and it is  still needed and removal ordered  Code Status:  full code Last date of multidisciplinary goals of care discussion [per primary ]  Kipp Brood, MD [facility] ICU Physician [place]  Pager: [phone] Or Epic Secure Chat After hours: [phone].  03/10/2021, 8:18 AM

## 2021-03-10 NOTE — Progress Notes (Signed)
° °   °  [address]       [place] [address]             [phone]                 1 Day Post-Op Procedure(s) (LRB): OFF PUMP CORONARY ARTERY BYPASS GRAFTING (CABG) X2, USING LEFT INTERNAL MAMMARY ARTERY AND RIGHT LEG GREATER SAPHENOUS VEIN HARVESTED ENDOSCOPICALLY (N/A) TRANSESOPHAGEAL ECHOCARDIOGRAM (TEE) (N/A)   Events: No event _______________________________________________________________ Vitals: BP (!) 110/53    Pulse 72    Temp 99.3 F (37.4 C)    Resp (!) 26    Ht 6\' 2"  (1.88 m)    Wt 113.9 kg    SpO2 92%    BMI 32.24 kg/m  Filed Weights   03/09/21 0618 03/10/21 0500  Weight: 114 kg 113.9 kg     - Neuro: alert NAD  - Cardiovascular: sinus  Drips: none.   PAP: (10-24)/(7-18) 10/8 CVP:  [5 mmHg-18 mmHg] 8 mmHg  - Pulm: EWOB   ABG    Component Value Date/Time   PHART 7.314 (L) 03/09/2021 2008   PCO2ART 41.5 03/09/2021 2008   PO2ART 82 (L) 03/09/2021 2008   HCO3 20.8 03/09/2021 2008   TCO2 22 03/09/2021 2008   ACIDBASEDEF 5.0 (H) 03/09/2021 2008   O2SAT 94.0 03/09/2021 2008    - Abd: ND - Extremity: warm  .Intake/Output      01/17 0701 01/18 0700 01/18 0701 01/19 0700   P.O. 200    I.V. (mL/kg) 3703.9 (32.5)    Blood 425    IV Piggyback 1446.5    Total Intake(mL/kg) 5775.4 (50.7)    Urine (mL/kg/hr) 2372 (0.9)    Blood 700    Chest Tube 450    Total Output 3522    Net +2253.4            _______________________________________________________________ Labs: CBC Latest Ref Rng & Units 03/10/2021 03/09/2021 03/09/2021  WBC 4.0 - 10.5 K/uL 8.1 - -  Hemoglobin 13.0 - 17.0 g/dL 10.8(L) 10.5(L) 11.2(L)  Hematocrit 39.0 - 52.0 % 33.1(L) 31.0(L) 33.0(L)  Platelets 150 - 400 K/uL 123(L) - -   CMP Latest Ref Rng & Units 03/10/2021 03/09/2021 03/09/2021  Glucose 70 - 99 mg/dL 133(H) - -  BUN 8 - 23 mg/dL 25(H) - -  Creatinine 0.61 - 1.24 mg/dL 1.48(H) - -  Sodium 135 - 145 mmol/L 137 139 141  Potassium 3.5 - 5.1 mmol/L 4.2 4.4 4.6   Chloride 98 - 111 mmol/L 107 - -  CO2 22 - 32 mmol/L 24 - -  Calcium 8.9 - 10.3 mg/dL 7.9(L) - -  Total Protein 6.5 - 8.1 g/dL - - -  Total Bilirubin 0.3 - 1.2 mg/dL - - -  Alkaline Phos 38 - 126 U/L - - -  AST 15 - 41 U/L - - -  ALT 0 - 44 U/L - - -    CXR: PV congest.  Low volumes  _______________________________________________________________  Assessment and Plan: POD 1 s/p CABG  Neuro: pain controlled CV: on A/S/BB.  Will start plavix today for off pump after wires remove Pulm: pulm hygiene Renal: creat stable GI: advancing diet Heme: stable ID: [email or web address] Endo: SSI Dispo: possible floor today   Lajuana Matte 03/10/2021 7:14 AM

## 2021-03-10 NOTE — Discharge Summary (Signed)
[address]       [place] [address]             [phone]    Physician Discharge Summary  Patient ID: [name] MRN: [id number] DOB/AGE: 82-Jun-1942 82 y.o.  Admit date: 03/09/2021 Discharge date: 03/16/2021  Admission Diagnoses:  Patient Active Problem List   Diagnosis Date Noted   Acute postoperative anemia due to expected blood loss 03/10/2021   Bilateral atelectasis 03/10/2021   CAD (coronary artery disease) 03/09/2021   GERD (gastroesophageal reflux disease) 03/09/2021   HTN (hypertension) 03/09/2021   Bloating 12/25/2019   Abdominal pain 12/25/2019   Colonic adenoma 05/01/2018   Degenerative arthritis of left knee 11/01/2017   Abnormal cardiac function test 09/22/2015   Chronic renal disease, stage III ([place]) 09/22/2015   Family history of coronary artery disease in mother 09/22/2015   Bifascicular block 09/22/2015   Hypertrophy of breast 11/26/2014   AKI (acute kidney injury) ([place]) 01/24/2014   Impaired mobility and ADLs 12/05/2013   Bacteremia 11/15/2013   Spinal abscess ([place]) 11/15/2013   Thoracic kyphosis 10/28/2013   Hemiplegia affecting dominant side, late effect of cerebrovascular disease 10/28/2013   Postoperative shock 56/38/7564   Acute diastolic congestive heart failure ([place]) 10/28/2013   Sciatica 10/18/2013   History of prostate cancer 10/08/2013   Left lumbar radiculopathy 10/08/2013   Personal history of colonic polyps 07/23/2013   Osteoarthritis of right knee 03/22/2012   Common bile duct (CBD) stricture 01/16/2012   Epigastric pain 01/19/2011   Hx of adenomatous colonic polyps 01/19/2011   Constipation 01/19/2011   Change in bowel function 01/19/2011   Rectal bleeding 01/19/2011   Thrombocytopenia ([place]) 01/19/2011   Discharge Diagnoses:  Patient Active Problem List   Diagnosis Date Noted   Acute postoperative anemia due to expected blood loss 03/10/2021   Bilateral atelectasis 03/10/2021   S/P CABG x 2 03/09/2021    CAD (coronary artery disease) 03/09/2021   GERD (gastroesophageal reflux disease) 03/09/2021   HTN (hypertension) 03/09/2021   Bloating 12/25/2019   Abdominal pain 12/25/2019   Colonic adenoma 05/01/2018   Degenerative arthritis of left knee 11/01/2017   Abnormal cardiac function test 09/22/2015   Chronic renal disease, stage III ([place]) 09/22/2015   Family history of coronary artery disease in mother 09/22/2015   Bifascicular block 09/22/2015   Hypertrophy of breast 11/26/2014   AKI (acute kidney injury) ([place]) 01/24/2014   Impaired mobility and ADLs 12/05/2013   Bacteremia 11/15/2013   Spinal abscess ([place]) 11/15/2013   Thoracic kyphosis 10/28/2013   Hemiplegia affecting dominant side, late effect of cerebrovascular disease 10/28/2013   Postoperative shock 82/29/5188   Acute diastolic congestive heart failure ([place]) 10/28/2013   Sciatica 10/18/2013   History of prostate cancer 10/08/2013   Left lumbar radiculopathy 10/08/2013   Personal history of colonic polyps 07/23/2013   Osteoarthritis of right knee 03/22/2012   Common bile duct (CBD) stricture 01/16/2012   Epigastric pain 01/19/2011   Hx of adenomatous colonic polyps 01/19/2011   Constipation 01/19/2011   Change in bowel function 01/19/2011   Rectal bleeding 01/19/2011   Thrombocytopenia ([place]) 01/19/2011   Discharged Condition: good  History of Present Illness:  [name] is an 81 yo male with history of CKD, chronic back pain, HTN, IBS, H/O Prostate cancer, and GERD.  The patient developed complaints of shortness of breath with activity.  He denied chest pain but he did have some degree of fatigue.  He was referred  to Dr. Domenic Polite by his PCP during which visit EKG was obtained and showed Sinus Bradycardia with a prolonged PR interval, RBBB, and left anterior fascicular block.  He had a previous myoview study in 2019 which showed an inferior wall scar with mild peri-infarct ischemia.  It was felt the patient would benefit  from cardiac catheterization to evaluate for possible CAD.  The patient underwent catheterization by Dr. Gwenlyn Found in November ov 2022 which showed 2V CAD.  It was felt coronary bypass grafting would be indicated and he was referred to Dr. Kipp Brood for evaluation.  At that time the patient denied any symptoms.  He is able climb ladders and whatever he wants around the house.  It was felt the patient would benefit from coronary bypass grafting.  The risks and benefits of the procedure were explained to the patient and he was agreeable to proceed.  Hospital Course:  [name] presented to [facility] on [facility]09[facility]2021.  He was taken to the operating room and underwent Off Pump CABG x 2 utilizing LIMA to LAD and SVG to PDA.  He also underwent endoscopic harvest of greater saphenous vein from his right thigh.  He tolerated the procedure without difficulty and was taken to the SICU in stable condition.  The was extubated the evening of surgery.  The patient has a history of CKD Stage 3.   He was in NSR and is pacing wires were removed without difficulty.  He subsequently has developed atrial fibrillation.  Due to his underlying bifascicular block cardiology was reconsulted to assist with medical management.  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  He has been started on Eliquis and Plavix has been stopped.  They are also recommending discharge with a 2-week ZIO life monitor for ongoing surveillance for higher degree of AV block.  In addition there is some concern over worsening sinus node dysfunction.  EP will follow as an outpatient and Dr. Miki Kins is arranging this.  His chest tube output decreased and these were removed on 03/11/2021.  The patient was difficult to wean off oxygen.  He was given a dose of IV lasix, which allowed him to wean down to 3L via [place].  He subsequently developed an AKI due to diuretic  use.  His creatinine level went up to 2.16.  It began to trend and as he was volume overloaded diuretics were reinitiated.  The patient developed progressive abdominal distention.  He denied N/V.  KUB was obtained and was consistent with the presence of an Ileus.  He was placed on a liquid diet and bowel care was continued.  This has significantly improved over time clinically.  His mobility was limited due to his chronic back pain.  He also developed a significant delirium and behavior was difficult to control.  He required anxiolytics as well as antipsychotics at night.  A sitter was also obtained.  PT consult was obtained and they felt patient would benefit from home health PT.  The patient's delirium resolved.  He has been difficult to wean off oxygen.  CXR was obtained and showed elevation of the right hemi-diaphragm.  There was no evidence of pleural effusion.  He was started on Lasix for volume overloaded state.  His creatinine level returned to baseline.  A walk test was performed and he will require 1L of oxygen via [place] at discharge.  ZIO patch has been arranged and placed.  His surgical incisions are healing without evidence of infection.  He is medically stable for discharge home today.  Consults: pulmonary/intensive care  Significant Diagnostic Studies: angiography:     Prox RCA to Mid RCA lesion is 60% stenosed.   Prox LAD lesion is 50% stenosed.   Prox LAD to Mid LAD lesion is 80% stenosed.   Mid LAD lesion is 90% stenosed.  Treatments: surgery:   03/09/2021    Patient:  [name] Pre-Op Dx: Jerl Santos CAD Stable Angina HTN      HLP   Post-op Dx:  same Procedure: Off pump CABG X 2.  LIMA LAD, RSVG PDA   Endoscopic greater saphenous vein harvest on the right   Surgeon and Role:      * Lightfoot, Lucile Crater, MD - Primary    * E. Carlous Olivares, PA-C - assisting with vein harvest, and anastamosis  Discharge Exam: Blood pressure 132/65, pulse 73, temperature 98.2 F (36.8 C), temperature  source Oral, resp. rate 20, height 6\' 2"  (1.88 m), weight 111.3 kg, SpO2 92 %.  General appearance: alert, cooperative, and no distress Heart: regular rate and rhythm Lungs: clear to auscultation bilaterally Abdomen: soft, non-tender; bowel sounds normal; no masses,  no organomegaly Extremities: extremities normal, atraumatic, no cyanosis or edema Wound: clean and dry    Discharge Medications:  The patient has been discharged on:   1.Beta Blocker:  Yes [ X  ]                              No   [   ]                              If No, reason:  2.Ace Inhibitor/ARB: Yes [   ]                                     No  [ X   ]                                     If No, reason: CKD  3.Statin:   Yes [ X  ]                  No  [   ]                  If No, reason:  4.Ecasa:  Yes  [ X  ]                  No   [   ]                  If No, reason:  Patient had ACS upon admission:  Plavix/P2Y12 inhibitor: Yes [   ]                                      No  [  x ]  Discharge Instructions     Amb Referral to Cardiac Rehabilitation   Complete by: As directed    Diagnosis: CABG   CABG X ___: 2   After initial  evaluation and assessments completed: Virtual Based Care may be provided alone or in conjunction with Phase 2 Cardiac Rehab based on patient barriers.: Yes      Allergies as of 03/16/2021   No Known Allergies      Medication List     STOP taking these medications    amLODipine 5 MG tablet Commonly known as: NORVASC   losartan 50 MG tablet Commonly known as: COZAAR       TAKE these medications    apixaban 2.5 MG Tabs tablet Commonly known as: ELIQUIS Take 1 tablet (2.5 mg total) by mouth 2 (two) times daily.   aspirin EC 81 MG tablet Take 81 mg by mouth daily. Swallow whole.   atorvastatin 40 MG tablet Commonly known as: LIPITOR Take 1 tablet (40 mg total) by mouth daily. What changed:  medication strength how much to take   cholecalciferol 25 MCG  (1000 UNIT) tablet Commonly known as: VITAMIN D3 Take 1,000 Units by mouth daily.   furosemide 40 MG tablet Commonly known as: LASIX Take 1 tablet (40 mg total) by mouth daily.   metoprolol tartrate 25 MG tablet Commonly known as: LOPRESSOR Take 1.5 tablets (37.5 mg total) by mouth 2 (two) times daily.   neomycin-polymyxin-hydrocortisone 3.5-10000-1 OTIC suspension Commonly known as: CORTISPORIN Place 4 drops into the right ear 3 (three) times daily.   potassium chloride SA 20 MEQ tablet Commonly known as: KLOR-CON M Take 1 tablet (20 mEq total) by mouth daily.   tamsulosin 0.4 MG Caps capsule Commonly known as: FLOMAX Take 1 capsule (0.4 mg total) by mouth daily after supper.   traMADol 50 MG tablet Commonly known as: ULTRAM Take 1-2 tablets (50-100 mg total) by mouth every 4 (four) hours as needed for moderate pain.   vitamin C 1000 MG tablet Take 1,000 mg by mouth daily.   zinc gluconate 50 MG tablet Take 50 mg by mouth daily.               Durable Medical Equipment  (From admission, onward)           Start     Ordered   03/16/21 1054  For home use only DME oxygen  Once       Question Answer Comment  Length of Need 6 Months   Mode or (Route) Nasal cannula   Liters per Minute 1   Frequency Continuous (stationary and portable oxygen unit needed)   Oxygen conserving device No   Oxygen delivery system Gas      03/16/21 [place] [place] [facility] Follow up.   Specialty: Home Health Services Why: Physical Therapy-office to call with visit times. Contact information: [place] [address] [phone]         Shirley Friar, PA-C Follow up.   Specialty: Physician Assistant Why: on 2/17 at 1120 for post hospital follow up Contact information: [place] [address] [phone]         Lajuana Matte, MD Follow up on  03/26/2021.   Specialty: Cardiothoracic Surgery Why: Appointment is at 2:00, this is a virtual appointment.  Dr. Kipp Brood will call you Contact information: [address] [place] [address] [phone]                 Signed:  Ellwood Handler, PA-C  03/16/2021, 10:56 AM

## 2021-03-10 NOTE — Progress Notes (Signed)
Patient ID: [name], male   DOB: 02-17-1941, 81 y.o.   MRN: [id number] TCTS Evening Rounds:  Hemodynamically stable in sinus rhythm. Sats 92% 6L [place]. CVP 7, CI 23.7 by FT.  UO 25-50/hr.  BMET    Component Value Date/Time   NA 135 03/10/2021 1719   K 4.0 03/10/2021 1719   CL 101 03/10/2021 1719   CO2 26 03/10/2021 1719   GLUCOSE 120 (H) 03/10/2021 1719   BUN 23 03/10/2021 1719   CREATININE 1.54 (H) 03/10/2021 1719   CREATININE 1.69 (H) 01/11/2021 1345   CREATININE 1.69 (H) 01/11/2021 1345   CALCIUM 8.1 (L) 03/10/2021 1719   EGFR 40 (L) 01/11/2021 1345   GFRNONAA 45 (L) 03/10/2021 1719   CBC    Component Value Date/Time   WBC 8.4 03/10/2021 1719   RBC 3.38 (L) 03/10/2021 1719   HGB 10.8 (L) 03/10/2021 1719   HCT 33.8 (L) 03/10/2021 1719   PLT 119 (L) 03/10/2021 1719   MCV 100.0 03/10/2021 1719   MCH 32.0 03/10/2021 1719   MCHC 32.0 03/10/2021 1719   RDW 13.3 03/10/2021 1719   LYMPHSABS 0.8 04/02/2019 1112   MONOABS 0.4 04/02/2019 1112   EOSABS 0.1 04/02/2019 1112   BASOSABS 0.0 04/02/2019 1112

## 2021-03-11 ENCOUNTER — Inpatient Hospital Stay (HOSPITAL_COMMUNITY): Payer: PPO

## 2021-03-11 LAB — BASIC METABOLIC PANEL
Anion gap: 7 (ref 5–15)
BUN: 24 mg/dL — ABNORMAL HIGH (ref 8–23)
CO2: 26 mmol/L (ref 22–32)
Calcium: 8 mg/dL — ABNORMAL LOW (ref 8.9–10.3)
Chloride: 102 mmol/L (ref 98–111)
Creatinine, Ser: 1.55 mg/dL — ABNORMAL HIGH (ref 0.61–1.24)
GFR, Estimated: 45 mL/min — ABNORMAL LOW (ref >60)
Glucose, Bld: 119 mg/dL — ABNORMAL HIGH (ref 70–99)
Potassium: 4.3 mmol/L (ref 3.5–5.1)
Sodium: 135 mmol/L (ref 135–145)

## 2021-03-11 LAB — CBC
HCT: 33.2 % — ABNORMAL LOW (ref 39.0–52.0)
Hemoglobin: 10.5 g/dL — ABNORMAL LOW (ref 13.0–17.0)
MCH: 31.7 pg (ref 26.0–34.0)
MCHC: 31.6 g/dL (ref 30.0–36.0)
MCV: 100.3 fL — ABNORMAL HIGH (ref 80.0–100.0)
Platelets: 121 10*3/uL — ABNORMAL LOW (ref 150–400)
RBC: 3.31 MIL/uL — ABNORMAL LOW (ref 4.22–5.81)
RDW: 13.5 % (ref 11.5–15.5)
WBC: 9.2 10*3/uL (ref 4.0–10.5)
nRBC: 0 % (ref 0.0–0.2)

## 2021-03-11 LAB — GLUCOSE, CAPILLARY
Glucose-Capillary: 144 mg/dL — ABNORMAL HIGH (ref 70–99)
Glucose-Capillary: 122 mg/dL — ABNORMAL HIGH (ref 70–99)
Glucose-Capillary: 147 mg/dL — ABNORMAL HIGH (ref 70–99)
Glucose-Capillary: 138 mg/dL — ABNORMAL HIGH (ref 70–99)

## 2021-03-11 MED ORDER — ~~LOC~~ CARDIAC SURGERY, PATIENT & FAMILY EDUCATION: Freq: Once | Status: AC

## 2021-03-11 MED ORDER — SODIUM CHLORIDE 0.9 % IV SOLN: 250.0000 mL | INTRAVENOUS | Status: DC | PRN

## 2021-03-11 MED ORDER — METOCLOPRAMIDE HCL 5 MG/ML IJ SOLN
10.0000 mg | Freq: Four times a day (QID) | INTRAMUSCULAR | Status: AC
  Administered 2021-03-11 (×4): 10 mg via INTRAVENOUS
  Filled 2021-03-11 (×4): qty 2

## 2021-03-11 MED ORDER — FUROSEMIDE 40 MG PO TABS: 40.0000 mg | ORAL_TABLET | Freq: Every day | ORAL | Status: DC

## 2021-03-11 MED ORDER — FUROSEMIDE 10 MG/ML IJ SOLN
40.0000 mg | Freq: Once | INTRAMUSCULAR | Status: AC
  Administered 2021-03-11: 40 mg via INTRAVENOUS
  Filled 2021-03-11: qty 4

## 2021-03-11 MED ORDER — SODIUM CHLORIDE 0.9% FLUSH: 3.0000 mL | INTRAVENOUS | Status: DC | PRN

## 2021-03-11 MED ORDER — SODIUM CHLORIDE 0.9% FLUSH
3.0000 mL | Freq: Two times a day (BID) | INTRAVENOUS | Status: DC
  Administered 2021-03-11 – 2021-03-12 (×4): 3 mL via INTRAVENOUS

## 2021-03-11 MED FILL — Heparin Sodium (Porcine) Inj 1000 Unit/ML: Qty: 1000 | Status: AC

## 2021-03-11 MED FILL — Cefazolin Sodium-Dextrose IV Solution 2 GM/100ML-4%: INTRAVENOUS | Qty: 100 | Status: AC

## 2021-03-11 MED FILL — Potassium Chloride Inj 2 mEq/ML: INTRAVENOUS | Qty: 40 | Status: AC

## 2021-03-11 MED FILL — Lidocaine HCl Local Preservative Free (PF) Inj 2%: INTRAMUSCULAR | Qty: 15 | Status: AC

## 2021-03-11 NOTE — Progress Notes (Signed)
CT surgery p.m. Rounds  Patient up resting in recliner breathing comfortably Oxygen saturation improving with Lasix diuresis Maintaining sinus rhythm  Blood pressure (!) 88/48, pulse 84, temperature 98.6 F (37 C), resp. rate 14, height 6\' 2"  (1.88 m), weight 115.9 kg, SpO2 91 %.

## 2021-03-11 NOTE — Evaluation (Signed)
Physical Therapy Evaluation Patient Details Name: [name] MRN: [id number] DOB: [birth year] Today's Date: 03/11/2021  History of Present Illness  Pt adm 1/17 a/p CABG x 2. PMH - multiple back surgeries, bil TKR, chronic back pain, arthritis, ckd, prostate CA.  Clinical Impression  Pt presents to PT with limited mobility due to weakness, decr balance, and likely orthostasis. Expect pt should progress well once more alert and not orthostatic.  Pt on 6L O2. Suspect pt with orthostasis. After sitting after second round of amb pt with BP 100/60's. Suspect likely lower during amb. Stood after returning to room with BP 77/65 in standing. Pt with legs becoming weak so unable to get 3 minute standing     Recommendations for follow up therapy are one component of a multi-disciplinary discharge planning process, led by the attending physician.  Recommendations may be updated based on patient status, additional functional criteria and insurance authorization.  Follow Up Recommendations Home health PT    Assistance Recommended at Discharge Intermittent Supervision/Assistance  Patient can return home with the following  A little help with walking and/or transfers;Help with stairs or ramp for entrance    Equipment Recommendations None recommended by PT  Recommendations for Other Services       Functional Status Assessment Patient has had a recent decline in their functional status and demonstrates the ability to make significant improvements in function in a reasonable and predictable amount of time.     Precautions / Restrictions Precautions Precautions: Fall;Other (comment) Precaution Comments: watch orthostatics      Mobility  Bed Mobility               General bed mobility comments: Pt up in chair    Transfers Overall transfer level: Needs assistance Equipment used: Rolling walker (2 wheels) (and EVA walker) Transfers: Sit to/from Stand Sit to Stand: Mod assist            General transfer comment: Assist to bring hips up and for balance. Verbal/tactile cue for hands to knees    Ambulation/Gait Ambulation/Gait assistance: Min assist, Mod assist Gait Distance (Feet): 10 Feet (10' x 1, 20' x 1) Assistive device: Rolling walker (2 wheels), Ethelene Hal Gait Pattern/deviations: Step-to pattern, Decreased step length - right, Decreased step length - left, Shuffle Gait velocity: decr Gait velocity interpretation: <1.31 ft/sec, indicative of household ambulator   General Gait Details: Stood from chair and used Multimedia programmer to initiate ambulation. Initially min assist for balance as pt with short, shuffling steps. After 7-8' pt began leaning rt and slow to respond and requiring mod assist. Called for assist and nurse brought the chair. Stood from chair and used rolling walker. Amb initially with min assist and again pt began slowing and with incr time to respond. Asked if lightheaded and pt said yes.  Stairs            Wheelchair Mobility    Modified Rankin (Stroke Patients Only)       Balance Overall balance assessment: Needs assistance Sitting-balance support: No upper extremity supported, Feet supported Sitting balance-Leahy Scale: Fair     Standing balance support: Bilateral upper extremity supported, During functional activity Standing balance-Leahy Scale: Poor Standing balance comment: walker and min assist                             Pertinent Vitals/Pain Pain Assessment Pain Assessment: No/denies pain    Home Living Family/patient expects to be discharged  to:: Private residence Living Arrangements: Spouse/significant other Available Help at Discharge: Family Type of Home: House Home Access: Stairs to enter Entrance Stairs-Rails: Right Entrance Stairs-Number of Steps: 2   Home Layout: One level Home Equipment: Conservation officer, nature (2 wheels)      Prior Function Prior Level of Function : Independent/Modified Independent                      Hand Dominance        Extremity/Trunk Assessment   Upper Extremity Assessment Upper Extremity Assessment: Defer to OT evaluation    Lower Extremity Assessment Lower Extremity Assessment: Generalized weakness       Communication   Communication: No difficulties  Cognition Arousal/Alertness: Awake/alert (Decr alertness during ambulation) Behavior During Therapy: Flat affect Overall Cognitive Status: Impaired/Different from baseline Area of Impairment: Attention, Following commands, Awareness, Problem solving                   Current Attention Level: Sustained   Following Commands: Follows one step commands inconsistently, Follows one step commands with increased time   Awareness: Intellectual Problem Solving: Slow processing, Requires verbal cues General Comments: Suspect likely due to pain meds/low BP        General Comments General comments (skin integrity, edema, etc.): Pt on 6L O2. Suspect pt with orthostasis. After sitting after second round of amb pt with BP 100/60's. Suspect likely lower during amb. Stood after returning to room with BP 77/65 in standing. Pt with legs becoming weak so unable to get 3 minute standing    Exercises     Assessment/Plan    PT Assessment Patient needs continued PT services  PT Problem List Decreased strength;Decreased activity tolerance;Decreased balance;Decreased mobility;Decreased cognition;Decreased knowledge of precautions;Cardiopulmonary status limiting activity       PT Treatment Interventions DME instruction;Gait training;Stair training;Functional mobility training;Therapeutic activities;Therapeutic exercise;Balance training;Patient/family education    PT Goals (Current goals can be found in the Care Plan section)  Acute Rehab PT Goals Patient Stated Goal: return home PT Goal Formulation: With patient Time For Goal Achievement: 03/25/21 Potential to Achieve Goals: Good    Frequency Min  3X/week     Co-evaluation               AM-PAC PT "6 Clicks" Mobility  Outcome Measure Help needed turning from your back to your side while in a flat bed without using bedrails?: A Little Help needed moving from lying on your back to sitting on the side of a flat bed without using bedrails?: A Lot Help needed moving to and from a bed to a chair (including a wheelchair)?: A Lot Help needed standing up from a chair using your arms (e.g., wheelchair or bedside chair)?: A Lot Help needed to walk in hospital room?: A Lot Help needed climbing 3-5 steps with a railing? : Total 6 Click Score: 12    End of Session   Activity Tolerance: Treatment limited secondary to medical complications (Comment) Patient left: in chair;with call bell/phone within reach;with family/visitor present Nurse Communication: Mobility status;Other (comment) (nurse present and aware) PT Visit Diagnosis: Other abnormalities of gait and mobility (R26.89);Unsteadiness on feet (R26.81);Muscle weakness (generalized) (M62.81)    Time: 3664-4034 PT Time Calculation (min) (ACUTE ONLY): 40 min   Charges:   PT Evaluation $PT Eval Moderate Complexity: 1 Mod PT Treatments $Gait Training: 23-37 mins        Alcan Border Pager [phone] Office Miranda  [place] 03/11/2021, 3:00 PM

## 2021-03-11 NOTE — Progress Notes (Addendum)
° °   °  [address]       [place] [address]             [phone]      2 Days Post-Op Procedure(s) (LRB): OFF PUMP CORONARY ARTERY BYPASS GRAFTING (CABG) X2, USING LEFT INTERNAL MAMMARY ARTERY AND RIGHT LEG GREATER SAPHENOUS VEIN HARVESTED ENDOSCOPICALLY (N/A) TRANSESOPHAGEAL ECHOCARDIOGRAM (TEE) (N/A)  Subjective:  Sitting up in the chair.  Biggest complaint is back pain.  No BM, not passing gas yet either  Objective: Vital signs in last 24 hours: Temp:  [98 F (36.7 C)-99.3 F (37.4 C)] 98.5 F (36.9 C) (01/19 0700) Pulse Rate:  [64-89] 86 (01/19 0700) Cardiac Rhythm: Normal sinus rhythm (01/19 0400) Resp:  [17-33] 28 (01/19 0700) BP: (94-143)/(49-106) 104/56 (01/19 0700) SpO2:  [89 %-97 %] 93 % (01/19 0700) Arterial Line BP: (89-159)/(33-65) 128/44 (01/18 1215)  Hemodynamic parameters for last 24 hours: CVP:  [4 mmHg-27 mmHg] 4 mmHg  Intake/Output from previous day: 01/18 0701 - 01/19 0700 In: 1839.5 [P.O.:1140; I.V.:399.5; IV Piggyback:300] Out: 605 [Urine:515; Chest Tube:90]  General appearance: alert, cooperative, and no distress Heart: regular rate and rhythm Lungs: diminished breath sounds bibasilar Abdomen: soft, distended, hypoactive BS Extremities: edema trace Wound: clean and dry dry EVH sites, aquacel on sternotomy  Lab Results: Recent Labs    03/10/21 1719 03/11/21 0355  WBC 8.4 9.2  HGB 10.8* 10.5*  HCT 33.8* 33.2*  PLT 119* 121*   BMET:  Recent Labs    03/10/21 1719 03/11/21 0355  NA 135 135  K 4.0 4.3  CL 101 102  CO2 26 26  GLUCOSE 120* 119*  BUN 23 24*  CREATININE 1.54* 1.55*  CALCIUM 8.1* 8.0*    PT/INR:  Recent Labs    03/09/21 1217  LABPROT 16.1*  INR 1.3*   ABG    Component Value Date/Time   PHART 7.314 (L) 03/09/2021 2008   HCO3 20.8 03/09/2021 2008   TCO2 22 03/09/2021 2008   ACIDBASEDEF 5.0 (H) 03/09/2021 2008   O2SAT 94.0 03/09/2021 2008   CBG (last 3)  Recent Labs    03/10/21 1551  03/10/21 2003 03/11/21 0701  GLUCAP 121* 139* 144*    Assessment/Plan: S/P Procedure(s) (LRB): OFF PUMP CORONARY ARTERY BYPASS GRAFTING (CABG) X2, USING LEFT INTERNAL MAMMARY ARTERY AND RIGHT LEG GREATER SAPHENOUS VEIN HARVESTED ENDOSCOPICALLY (N/A) TRANSESOPHAGEAL ECHOCARDIOGRAM (TEE) (N/A)  CV- Sinus Bradycardia- on Lopressor, EPW out yesterday Pulm- CT output has been low, will d/c today, weaning oxygen as tolerated down to 3L via [place], currently on CXR with atelectasis bilaterally, encouraged patient to use IS more frequently and take good deep breaths despite his pain Renal- CKD Stage 3, creatinine remains stable, weight is stable,per nursing difficulty weaning oxygen,, marginal U/O will give lasix today Expected post operative blood loss anemia, mild hgb at 10.5 CBGs controlled, not a diabetic, will stop SSIP Dispo- patient stable, in Sinus Bradycardia, will d/c chest tubes today, stop cbgs, creatinine is stable, transfer to 4E today, PT consult to help with mobility as back pain is hindering   LOS: 2 days    Ellwood Handler, PA-C 03/11/2021  He is on 6L [place] this am with sats 93%. CXR shows increased bibasilar atelectasis. He needs to work on [place]. Limited by pain. Hopefully getting chest tubes out will help. Creat stable around his baseline. Wt is 4 lbs over preop. Will see how he responds to lasix this morning.

## 2021-03-11 NOTE — Progress Notes (Signed)
Critical Care Progress Note  NAME:  [name], MRN:  [id number], DOB:  20-Dec-1940, LOS: 2 ADMISSION DATE:  03/09/2021, CONSULTATION DATE:  1/17 REFERRING MD:  Kipp Brood, CHIEF COMPLAINT:  post-op critical care    History of Present Illness:  81 year old male dx'd Nov 2022 w/ severe 2V CAD involving RCA and LAD.  At baseline very active. Still functional prior surgery reporting no limitations from dyspnea or chest pain. Presented to one 1/17 for elective CABG x 2V (off pump).  Surgery completed: CABG X 2.  LIMA LAD, RSVG PDA  Endoscopic greater saphenous vein harvest on the right. Pulmonary asked to evaluate and assist w/ post-op ICU care   Pertinent  Medical History  CKD stage III, HTN, GERD, prior H pylori, IBD, adenomatous polyps of colon, IBS, Prostate cancer Schatzki's ring, prior  appendectomy, prior cholecystectomy  Significant Hospital Events: Including procedures, antibiotic start and stop dates in addition to other pertinent events   1/17 for elective CABG x 2V (off pump).  Surgery completed: CABG X 2.  LIMA LAD, RSVG PDA  Endoscopic greater saphenous vein harvest on the right 1/17 fast track extubation .   Interim History / Subjective:    Chest pain improved.  Objective   Blood pressure (!) 104/56, pulse 86, temperature 98.5 F (36.9 C), temperature source Oral, resp. rate (!) 28, height 6\' 2"  (1.88 m), weight 115.9 kg, SpO2 93 %. CVP:  [4 mmHg-27 mmHg] 4 mmHg      Intake/Output Summary (Last 24 hours) at 03/11/2021 1023 Last data filed at 03/11/2021 0700 Gross per 24 hour  Intake 1345.44 ml  Output 500 ml  Net 845.44 ml    Filed Weights   03/09/21 0618 03/10/21 0500 03/11/21 0500  Weight: 114 kg 113.9 kg 115.9 kg    Examination: General: 81 year old wm in bed and comfortable.  HENT: NCAT no JVD  Lungs: clear, splinting with deep breathing has improved.  Still on 6 L but able to take deeper breaths now getting 500 mL on incentive spirometry Cardiovascular: RRR, not  pacing. No rub. Minimal drainage  Abdomen: soft  Extremities: warm and dry  Neuro: intact.  GU: clear yellow   Ancillary tests personally reviewed:   CXR 1/18 : low lung volumes and bibasilar atelectasis.  Creatinine has increased slightly to 1.55 Adequate glycemic control Assessment & Plan:  Principal Problem:   S/P CABG x 2 Active Problems:   Chronic renal disease, stage III (HCC)   History of prostate cancer   CAD (coronary artery disease)   GERD (gastroesophageal reflux disease)   HTN (hypertension)   Acute postoperative anemia due to expected blood loss   Bilateral atelectasis  Plan:   - Improve pain control. Encourage use of oral analgesics, will add gabapentin for co-analgesia.  - progressive ambulation and encourage incentive spirometry. -Diurese today - Progress per care pathway - remove lines and drains -Adequate glycemic - Initiate secondary cardiac prevention: betablocker, increase atorvastatin to 40mg , Plavix. Losartan to restart closer to discharge.   PCCM will sign off.  Please reconsult if further questions arise.  Best Practice (right click and "Reselect all SmartList Selections" daily)   Diet/type: Regular consistency (see orders) DVT prophylaxis: SCD - start heparin tomorrow GI prophylaxis: PPI Lines: Central line and Arterial Line can be removed.  Foley:  Yes, and it is still needed and removal ordered  Code Status:  full code Last date of multidisciplinary goals of care discussion [per primary ]  Kipp Brood,  MD [facility] ICU Physician [place]  Pager: [phone] Or Epic Secure Chat After hours: [phone].  03/11/2021, 10:23 AM

## 2021-03-11 NOTE — Progress Notes (Signed)
1030 Atrial pacing wire removed per protocol, pt tol well.  Wire fully intact upon removal.  VS q 15 min, bedrest x 1 hour.

## 2021-03-12 ENCOUNTER — Inpatient Hospital Stay (HOSPITAL_COMMUNITY): Payer: PPO

## 2021-03-12 LAB — BASIC METABOLIC PANEL
Anion gap: 7 (ref 5–15)
BUN: 37 mg/dL — ABNORMAL HIGH (ref 8–23)
CO2: 29 mmol/L (ref 22–32)
Calcium: 8.4 mg/dL — ABNORMAL LOW (ref 8.9–10.3)
Chloride: 97 mmol/L — ABNORMAL LOW (ref 98–111)
Creatinine, Ser: 2.16 mg/dL — ABNORMAL HIGH (ref 0.61–1.24)
GFR, Estimated: 30 mL/min — ABNORMAL LOW (ref >60)
Glucose, Bld: 132 mg/dL — ABNORMAL HIGH (ref 70–99)
Potassium: 4.4 mmol/L (ref 3.5–5.1)
Sodium: 133 mmol/L — ABNORMAL LOW (ref 135–145)

## 2021-03-12 LAB — CBC
HCT: 32.3 % — ABNORMAL LOW (ref 39.0–52.0)
Hemoglobin: 9.9 g/dL — ABNORMAL LOW (ref 13.0–17.0)
MCH: 30.8 pg (ref 26.0–34.0)
MCHC: 30.7 g/dL (ref 30.0–36.0)
MCV: 100.6 fL — ABNORMAL HIGH (ref 80.0–100.0)
Platelets: 131 10*3/uL — ABNORMAL LOW (ref 150–400)
RBC: 3.21 MIL/uL — ABNORMAL LOW (ref 4.22–5.81)
RDW: 13.4 % (ref 11.5–15.5)
WBC: 8.9 10*3/uL (ref 4.0–10.5)
nRBC: 0 % (ref 0.0–0.2)

## 2021-03-12 LAB — GLUCOSE, CAPILLARY
Glucose-Capillary: 133 mg/dL — ABNORMAL HIGH (ref 70–99)
Glucose-Capillary: 119 mg/dL — ABNORMAL HIGH (ref 70–99)
Glucose-Capillary: 115 mg/dL — ABNORMAL HIGH (ref 70–99)

## 2021-03-12 MED ORDER — SODIUM CHLORIDE 0.9% FLUSH
3.0000 mL | Freq: Two times a day (BID) | INTRAVENOUS | Status: DC
  Administered 2021-03-12 – 2021-03-16 (×8): 3 mL via INTRAVENOUS

## 2021-03-12 MED ORDER — SODIUM CHLORIDE 0.9% FLUSH: 3.0000 mL | Freq: Two times a day (BID) | INTRAVENOUS | Status: DC

## 2021-03-12 NOTE — Progress Notes (Addendum)
° °   °  [address]       [place] [address]             [phone]      3 Days Post-Op Procedure(s) (LRB): OFF PUMP CORONARY ARTERY BYPASS GRAFTING (CABG) X2, USING LEFT INTERNAL MAMMARY ARTERY AND RIGHT LEG GREATER SAPHENOUS VEIN HARVESTED ENDOSCOPICALLY (N/A) TRANSESOPHAGEAL ECHOCARDIOGRAM (TEE) (N/A)  Subjective:  Patient again up in chair.  He states his back pain is a little better.  He denies N/V.  He is not passing any gas  Objective: Vital signs in last 24 hours: Temp:  [98.6 F (37 C)-99.2 F (37.3 C)] 98.7 F (37.1 C) (01/20 0300) Pulse Rate:  [66-108] 69 (01/20 0700) Cardiac Rhythm: Heart block;Bundle branch block (01/20 0400) Resp:  [11-29] 18 (01/20 0700) BP: (84-122)/(48-68) 103/52 (01/20 0700) SpO2:  [89 %-96 %] 93 % (01/20 0700) Weight:  [115.4 kg] 115.4 kg (01/20 0500)  Intake/Output from previous day: 01/19 0701 - 01/20 0700 In: 840 [P.O.:840] Out: 1220 [Urine:1200; Chest Tube:20]  General appearance: alert, cooperative, and no distress Heart: regular rate and rhythm Lungs: diminished breath sounds bibasilar Abdomen: soft, non-tender; bowel sounds normal; no masses,  no organomegaly Extremities: extremities normal, atraumatic, no cyanosis or edema Wound: clean and dry  Lab Results: Recent Labs    03/11/21 0355 03/12/21 0147  WBC 9.2 8.9  HGB 10.5* 9.9*  HCT 33.2* 32.3*  PLT 121* 131*   BMET:  Recent Labs    03/11/21 0355 03/12/21 0147  NA 135 133*  K 4.3 4.4  CL 102 97*  CO2 26 29  GLUCOSE 119* 132*  BUN 24* 37*  CREATININE 1.55* 2.16*  CALCIUM 8.0* 8.4*    PT/INR:  Recent Labs    03/09/21 1217  LABPROT 16.1*  INR 1.3*   ABG    Component Value Date/Time   PHART 7.314 (L) 03/09/2021 2008   HCO3 20.8 03/09/2021 2008   TCO2 22 03/09/2021 2008   ACIDBASEDEF 5.0 (H) 03/09/2021 2008   O2SAT 94.0 03/09/2021 2008   CBG (last 3)  Recent Labs    03/11/21 1645 03/11/21 2210 03/12/21 0617  GLUCAP 147* 138*  133*    Assessment/Plan: S/P Procedure(s) (LRB): OFF PUMP CORONARY ARTERY BYPASS GRAFTING (CABG) X2, USING LEFT INTERNAL MAMMARY ARTERY AND RIGHT LEG GREATER SAPHENOUS VEIN HARVESTED ENDOSCOPICALLY (N/A) TRANSESOPHAGEAL ECHOCARDIOGRAM (TEE) (N/A)  CV- NSR, BP stable continue low dose Lopressor Pulm- down to 3L, CXR w/o significant effusion, continue IS Renal- AKI, creatinine up to 2.16, this is due to IV diuretics received yesterday, also stool burden could be attributing.. his weight is stable.. no further lasix, potassium at this time GI- bowel is further distended today, denies N/V.Marland Kitchen but no real BS appreciated, patient is not passing gas... he has received reglan, stool softner prn.. Im concerned there is an early ileus.. will check KUB and may need to give some fluid and transition to liquid diet to slow oral intake Expected post operative blood loss anemia, stable CBGs controlled, not a diabetic will d/c SSIP Dispo- patient stable, creatinine up due to diuretics, GI issues will start ileus workup, no further lasix, potassium at this time... awaiting bed on 4E   LOS: 3 days    Walter Handler, PA-C 03/12/2021  Agree with above Creat up, holding diuresis Awaiting return of bowel function Aggressive ambulation Awaiting 4E bed  [name]

## 2021-03-12 NOTE — Progress Notes (Signed)
CCMD notified that patient's HR afib 110s -120s. Does not sustain.   Scheduled metoprolol given.   Patient resting and asymptomatic.

## 2021-03-12 NOTE — Progress Notes (Signed)
Pt admitted to [facility] from [facility].  Pt is A&OX4 and neuro intact.  Pt placed on telemetry and CCMD notified.  Vitals taken and within normal range.  Pt is currently comfortable and not in pain.  03/12/21 1856  Vitals  Temp 98.6 F (37 C)  Temp Source Oral  BP (!) 137/55  MAP (mmHg) 74  BP Location Right Arm  BP Method Automatic  Patient Position (if appropriate) Lying  Pulse Rate 85  Pulse Rate Source Monitor  ECG Heart Rate 86  Resp (!) 22  Level of Consciousness  Level of Consciousness Alert  Oxygen Therapy  SpO2 91 %  O2 Device Nasal Cannula  O2 Flow Rate (L/min) 4 L/min  Pain Assessment  Pain Scale 0-10  Pain Score 0  PCA/Epidural/Spinal Assessment  Respiratory Pattern Regular;Unlabored  Glasgow Coma Scale  Eye Opening 4  Best Verbal Response (NON-intubated) 5  Best Motor Response 6  Glasgow Coma Scale Score 15  MEWS Score  MEWS Temp 0  MEWS Systolic 0  MEWS Pulse 0  MEWS RR 1  MEWS LOC 0  MEWS Score 1  MEWS Score Color Green

## 2021-03-12 NOTE — Evaluation (Signed)
Occupational Therapy Evaluation Patient Details Name: [name] MRN: [id number] DOB: Jan 27, 1941 Today's Date: 03/12/2021   History of Present Illness Pt adm 1/17 a/p CABG x 2. PMH - multiple back surgeries, bil TKR, chronic back pain, arthritis, ckd, prostate CA.   Clinical Impression   This 81 yo male admitted and underwent above presents to acute OT with PLOF of being totally independent and active in everyday tasks including being able to climb ladders. He currently has sternal precautions that affect his self care and mobility. He has his wife at home that can A with basic ADLs prn. He will continue to benefit from acute OT without need for follow up.      Recommendations for follow up therapy are one component of a multi-disciplinary discharge planning process, led by the attending physician.  Recommendations may be updated based on patient status, additional functional criteria and insurance authorization.   Follow Up Recommendations  No OT follow up    Assistance Recommended at Discharge Intermittent Supervision/Assistance  Patient can return home with the following A little help with bathing/dressing/bathroom;Assistance with cooking/housework    Functional Status Assessment  Patient has had a recent decline in their functional status and demonstrates the ability to make significant improvements in function in a reasonable and predictable amount of time.  Equipment Recommendations  None recommended by OT       Precautions / Restrictions Precautions Precautions: Fall;Other (comment) Precaution Comments: watch BP Restrictions Weight Bearing Restrictions: Yes Other Position/Activity Restrictions: sternal      Mobility Bed Mobility               General bed mobility comments: Pt up in chair    Transfers Overall transfer level: Needs assistance Equipment used: Rolling walker (2 wheels) Transfers: Sit to/from Stand Sit to Stand: Mod assist, +2 physical  assistance, Min assist           General transfer comment: On first attempt from chair pt unable to rise with +2 min assist and required 2nd attempt with +2 mod assist. On subsequent stand rose with +2 min assist.      Balance Overall balance assessment: Needs assistance Sitting-balance support: No upper extremity supported, Feet supported Sitting balance-Leahy Scale: Fair     Standing balance support: Single extremity supported, During functional activity Standing balance-Leahy Scale: Poor Standing balance comment: support of one UE and/or bracing of legs on chair for support during standing for grooming                           ADL either performed or assessed with clinical judgement   ADL Overall ADL's : Needs assistance/impaired Eating/Feeding: Independent;Sitting   Grooming: Min guard;Standing;Wash/dry face;Oral care   Upper Body Bathing: Set up;Sitting   Lower Body Bathing: Moderate assistance Lower Body Bathing Details (indicate cue type and reason): +2 mod A sit<>stand Upper Body Dressing : Minimal assistance;Sitting   Lower Body Dressing: Maximal assistance Lower Body Dressing Details (indicate cue type and reason): +2 mod A sit<>stand Toilet Transfer: Minimal assistance;+2 for safety/equipment;Ambulation;Rolling walker (2 wheels) Toilet Transfer Details (indicate cue type and reason): Mod A +2 sit<>stand, simulated recliner>out and ambulating around hallway>sit in recliner when back to room Toileting- Clothing Manipulation and Hygiene: Moderate assistance Toileting - Clothing Manipulation Details (indicate cue type and reason): Mod A +2 sit<>stand             Vision Ability to See in Adequate Light: 0 Adequate Patient Visual  Report: No change from baseline              Pertinent Vitals/Pain Pain Assessment Pain Assessment: Faces Faces Pain Scale: Hurts little more Pain Location: chronic back Pain Descriptors / Indicators: Grimacing,  Discomfort Pain Intervention(s): Limited activity within patient's tolerance, Monitored during session, Repositioned     Hand Dominance Right   Extremity/Trunk Assessment Upper Extremity Assessment Upper Extremity Assessment: Overall WFL for tasks assessed           Communication Communication Communication: No difficulties   Cognition Arousal/Alertness: Awake/alert Behavior During Therapy: WFL for tasks assessed/performed Overall Cognitive Status: Impaired/Different from baseline Area of Impairment: Following commands, Awareness, Problem solving, Memory, Safety/judgement                     Memory: Decreased recall of precautions, Decreased short-term memory Following Commands: Follows one step commands with increased time Safety/Judgement: Decreased awareness of safety, Decreased awareness of deficits Awareness: Emergent Problem Solving: Slow processing, Requires verbal cues General Comments: Pt slowed processing but HOH may be contributing                Home Living Family/patient expects to be discharged to:: Private residence Living Arrangements: Spouse/significant other Available Help at Discharge: Family;Available 24 hours/day Type of Home: House Home Access: Stairs to enter [employer] of Steps: 2 Entrance Stairs-Rails: Right Home Layout: One level     Bathroom Shower/Tub: Tub/shower unit;Curtain   Bathroom Toilet: Handicapped height     Home Equipment: Conservation officer, nature (2 wheels)          Prior Functioning/Environment Prior Level of Function : Independent/Modified Independent                        OT Problem List: Impaired balance (sitting and/or standing);Pain;Decreased knowledge of precautions      OT Treatment/Interventions: Self-care/ADL training;DME and/or AE instruction;Patient/family education;Balance training    OT Goals(Current goals can be found in the care plan section) Acute Rehab OT Goals Patient  Stated Goal: to go home OT Goal Formulation: With patient Time For Goal Achievement: 03/26/21 Potential to Achieve Goals: Good  OT Frequency: Min 2X/week    Co-evaluation PT/OT/SLP Co-Evaluation/Treatment: Yes Reason for Co-Treatment: For patient/therapist safety PT goals addressed during session: Mobility/safety with mobility OT goals addressed during session: ADL's and self-care;Strengthening/ROM      AM-PAC OT "6 Clicks" Daily Activity     Outcome Measure Help from another person eating meals?: None Help from another person taking care of personal grooming?: A Little Help from another person toileting, which includes using toliet, bedpan, or urinal?: A Lot Help from another person bathing (including washing, rinsing, drying)?: A Lot Help from another person to put on and taking off regular upper body clothing?: A Little Help from another person to put on and taking off regular lower body clothing?: A Lot 6 Click Score: 16   End of Session Equipment Utilized During Treatment: Gait belt;Rolling walker (2 wheels);Oxygen Nurse Communication: Mobility status  Activity Tolerance: Patient tolerated treatment well Patient left: in chair;with call bell/phone within reach  OT Visit Diagnosis: Unsteadiness on feet (R26.81);Muscle weakness (generalized) (M62.81);Pain Pain - part of body:  (back)                Time: 1610-9604 OT Time Calculation (min): 24 min Charges:  OT General Charges $OT Visit: 1 Visit OT Treatments $Self Care/Home Management : 8-22 mins  [place] Circle, OTR/L Acute [employer] Pager [phone]  Office [phone]    Almon Register 03/12/2021, 4:54 PM

## 2021-03-12 NOTE — Progress Notes (Signed)
Patient having some hallucinations. Patient reoriented and answers questions appropriately.

## 2021-03-12 NOTE — Progress Notes (Signed)
Patient stated that he passed a little bit of gas.

## 2021-03-12 NOTE — Progress Notes (Signed)
° °   °  [address]       ,[place] [address]             [phone]      POD # 3 OPCAB x 2  No complaints, but no BM or flatus yet  BP (!) 107/41    Pulse 90    Temp 98.8 F (37.1 C) (Oral)    Resp (!) 27    Ht 6\' 2"  (1.88 m)    Wt 115.4 kg    SpO2 94%    BMI 32.66 kg/m   Intake/Output Summary (Last 24 hours) at 03/12/2021 1816 Last data filed at 03/12/2021 1600 Gross per 24 hour  Intake 1080 ml  Output 775 ml  Net 305 ml   KUB c/w ileus  Remo Lipps C. Roxan Hockey, MD [facility] [phone]

## 2021-03-12 NOTE — Progress Notes (Signed)
Physical Therapy Treatment Patient Details Name: [name] MRN: [id number] DOB: [birth year] Today's Date: 03/12/2021   History of Present Illness Pt adm 1/17 a/p CABG x 2. PMH - multiple back surgeries, bil TKR, chronic back pain, arthritis, ckd, prostate CA.    PT Comments    Pt with much improved mobility and activity tolerance. Will continue to work toward return home.    Recommendations for follow up therapy are one component of a multi-disciplinary discharge planning process, led by the attending physician.  Recommendations may be updated based on patient status, additional functional criteria and insurance authorization.  Follow Up Recommendations  Home health PT     Assistance Recommended at Discharge Intermittent Supervision/Assistance  Patient can return home with the following A little help with walking and/or transfers;Help with stairs or ramp for entrance   Equipment Recommendations  None recommended by PT    Recommendations for Other Services       Precautions / Restrictions Precautions Precautions: Fall;Other (comment) Precaution Comments: watch BP Restrictions Weight Bearing Restrictions: Yes (sternal precautions)     Mobility  Bed Mobility               General bed mobility comments: Pt up in chair    Transfers Overall transfer level: Needs assistance Equipment used: Rolling walker (2 wheels) Transfers: Sit to/from Stand Sit to Stand: Mod assist, +2 physical assistance, Min assist           General transfer comment: On first attempt from chair pt unable to rise with +2 min assist and required 2nd attempt with +2 mod assist. On subsequent stand rose with +2 min assist.    Ambulation/Gait Ambulation/Gait assistance: Min assist, +2 safety/equipment Gait Distance (Feet): 200 Feet Assistive device: Rolling walker (2 wheels), Ethelene Hal Gait Pattern/deviations: Step-through pattern, Wide base of support, Decreased stride length, Trunk  flexed Gait velocity: at times too fast for his current abilities Gait velocity interpretation: 1.31 - 2.62 ft/sec, indicative of limited community ambulator   General Gait Details: Assist for stability and verbal cues for pt to slow down and to stay closer to walker.   Stairs             Wheelchair Mobility    Modified Rankin (Stroke Patients Only)       Balance Overall balance assessment: Needs assistance Sitting-balance support: No upper extremity supported, Feet supported Sitting balance-Leahy Scale: Fair     Standing balance support: Bilateral upper extremity supported, During functional activity, No upper extremity supported Standing balance-Leahy Scale: Poor Standing balance comment: support of UE's or bracing of legs on chair for support                            Cognition Arousal/Alertness: Awake/alert Behavior During Therapy: Flat affect Overall Cognitive Status: Impaired/Different from baseline Area of Impairment: Following commands, Awareness, Problem solving, Memory, Safety/judgement                     Memory: Decreased recall of precautions, Decreased short-term memory Following Commands: Follows one step commands with increased time Safety/Judgement: Decreased awareness of safety, Decreased awareness of deficits Awareness: Emergent Problem Solving: Slow processing, Requires verbal cues General Comments: Pt more alert and interactive today. Still some slowed processing but [place] may be contributing        Exercises      General Comments General comments (skin integrity, edema, etc.): BP stable with >100/60 prior to and  after ambSpO2 ~92% with amb on      Pertinent Vitals/Pain Pain Assessment Pain Assessment: Faces Faces Pain Scale: Hurts little more Pain Location: chronic back Pain Descriptors / Indicators: Grimacing, Discomfort Pain Intervention(s): Limited activity within patient's tolerance    Home Living                           Prior Function            PT Goals (current goals can now be found in the care plan section) Acute Rehab PT Goals Patient Stated Goal: return home Progress towards PT goals: Progressing toward goals    Frequency    Min 3X/week      PT Plan Current plan remains appropriate    Co-evaluation PT/OT/SLP Co-Evaluation/Treatment: Yes Reason for Co-Treatment: For patient/therapist safety PT goals addressed during session: Mobility/safety with mobility        AM-PAC PT "6 Clicks" Mobility   Outcome Measure  Help needed turning from your back to your side while in a flat bed without using bedrails?: A Little Help needed moving from lying on your back to sitting on the side of a flat bed without using bedrails?: A Little Help needed moving to and from a bed to a chair (including a wheelchair)?: A Lot Help needed standing up from a chair using your arms (e.g., wheelchair or bedside chair)?: A Lot Help needed to walk in hospital room?: A Little Help needed climbing 3-5 steps with a railing? : A Lot 6 Click Score: 15    End of Session   Activity Tolerance: Patient tolerated treatment well Patient left: in chair;with call bell/phone within reach Nurse Communication: Mobility status PT Visit Diagnosis: Other abnormalities of gait and mobility (R26.89);Unsteadiness on feet (R26.81);Muscle weakness (generalized) (M62.81)     Time: 6015-6153 PT Time Calculation (min) (ACUTE ONLY): 21 min  Charges:  $Gait Training: 8-22 mins                     [place] Pager [phone] Office [place] 03/12/2021, 11:59 AM

## 2021-03-12 NOTE — Progress Notes (Signed)
1645- attempt made to call report   1730- 2nd attempt made to call report

## 2021-03-12 NOTE — TOC Initial Note (Addendum)
Transition of Care [facility]) - Initial/Assessment Note    Patient Details  Name: [name] MRN: [id number] Date of Birth: Jun 01, 1940  [facility] CM/SW Contact:    Bethena Roys, RN Phone Number: 03/12/2021, 4:51 PM  Clinical Narrative: Physical Therapy recommendations for home health PT. Case Manager spoke with patient and he stated he would benefit from therapy at home. Son at the bedside and also agreeable to home health services. Medicare.gov list provided and family chose [facility]-referral placed. Case Manager awaiting call back from the agency for confirmation of services. Case Manager will follow up on Saturday.             03-12-21 1659 Case Manager received notification from [facility] that they can service the patient. Start of Care to begin within 24-48 hours post transition home. Case Manager will continue to follow for additional needs.   Expected Discharge Plan: [place] Barriers to Discharge: Continued Medical Work up   Patient Goals and CMS Choice Patient states their goals for this hospitalization and ongoing recovery are:: patient would like to return home. CMS Medicare.gov Compare Post Acute Care list provided to:: Patient Choice offered to / list presented to : Patient, Adult Children  Expected Discharge Plan and Services Expected Discharge Plan: [place] In-house Referral: NA Discharge Planning Services: CM Consult Post Acute Care Choice: [place] arrangements for the past 2 months: Single Family Home                   DME Agency: NA       HH Arranged: PT HH Agency: [place] Date HH Agency Contacted: 03/12/21 Time HH Agency Contacted: 82 Representative spoke with at [place]: Lanetta Inch (Awaiting call back for acceptance confirmation.)  Prior Living Arrangements/Services Living arrangements for the past 2 months: Single Family Home Lives with:: Spouse  (Has support of children.) Patient language and need for interpreter reviewed:: Yes Do you feel safe going back to the place where you live?: Yes      Need for Family Participation in Patient Care: Yes (Comment) Care giver support system in place?: Yes (comment) Current home services: DME (patient has rolling walker at home.) Criminal Activity/Legal Involvement Pertinent to Current Situation/Hospitalization: No - Comment as needed  Activities of Daily Living      Permission Sought/Granted Permission sought to share information with : Family Supports, Case Freight forwarder, Chartered certified accountant granted to share information with : Yes, Verbal Permission Granted     Permission granted to share info w AGENCY: [facility]        Emotional Assessment Appearance:: Appears stated age Attitude/Demeanor/Rapport: Engaged Affect (typically observed): Appropriate Orientation: : Oriented to  Time, Oriented to Place, Oriented to Self, Oriented to Situation Alcohol / Substance Use: Not Applicable Psych Involvement: No (comment)  Admission diagnosis:  S/P CABG x 2 [Z95.1] Patient Active Problem List   Diagnosis Date Noted   Acute postoperative anemia due to expected blood loss 03/10/2021   Bilateral atelectasis 03/10/2021   S/P CABG x 2 03/09/2021   CAD (coronary artery disease) 03/09/2021   GERD (gastroesophageal reflux disease) 03/09/2021   HTN (hypertension) 03/09/2021   Bloating 12/25/2019   Abdominal pain 12/25/2019   Colonic adenoma 05/01/2018   Degenerative arthritis of left knee 11/01/2017   Abnormal cardiac function test 09/22/2015   Chronic renal disease, stage III ([place]) 09/22/2015   Family history of coronary artery disease  in mother 09/22/2015   Bifascicular block 09/22/2015   Hypertrophy of breast 11/26/2014   AKI (acute kidney injury) ([place]) 01/24/2014   Impaired mobility and ADLs 12/05/2013   Bacteremia 11/15/2013   Spinal abscess ([place]) 11/15/2013    Thoracic kyphosis 10/28/2013   Hemiplegia affecting dominant side, late effect of cerebrovascular disease 10/28/2013   Postoperative shock 40/11/2723   Acute diastolic congestive heart failure ([place]) 10/28/2013   Sciatica 10/18/2013   History of prostate cancer 10/08/2013   Left lumbar radiculopathy 10/08/2013   Personal history of colonic polyps 07/23/2013   Osteoarthritis of right knee 03/22/2012   Common bile duct (CBD) stricture 01/16/2012   Epigastric pain 01/19/2011   Hx of adenomatous colonic polyps 01/19/2011   Constipation 01/19/2011   Change in bowel function 01/19/2011   Rectal bleeding 01/19/2011   Thrombocytopenia ([place]) 01/19/2011   PCP:  Celene Squibb, MD Pharmacy:   [place], [place] [address] Phone: [phone] Fax: [phone]  [facility] - [place], [place] - [address] [place] [address] Phone: [phone] Fax: [phone]   Readmission Risk Interventions No flowsheet data found.

## 2021-03-13 ENCOUNTER — Inpatient Hospital Stay (HOSPITAL_COMMUNITY): Payer: PPO

## 2021-03-13 DIAGNOSIS — I4891 Unspecified atrial fibrillation: Secondary | ICD-10-CM

## 2021-03-13 DIAGNOSIS — I495 Sick sinus syndrome: Secondary | ICD-10-CM

## 2021-03-13 DIAGNOSIS — Z951 Presence of aortocoronary bypass graft: Secondary | ICD-10-CM

## 2021-03-13 LAB — BASIC METABOLIC PANEL
Anion gap: 6 (ref 5–15)
BUN: 46 mg/dL — ABNORMAL HIGH (ref 8–23)
CO2: 28 mmol/L (ref 22–32)
Calcium: 8.1 mg/dL — ABNORMAL LOW (ref 8.9–10.3)
Chloride: 99 mmol/L (ref 98–111)
Creatinine, Ser: 1.79 mg/dL — ABNORMAL HIGH (ref 0.61–1.24)
GFR, Estimated: 38 mL/min — ABNORMAL LOW (ref >60)
Glucose, Bld: 149 mg/dL — ABNORMAL HIGH (ref 70–99)
Potassium: 4.1 mmol/L (ref 3.5–5.1)
Sodium: 133 mmol/L — ABNORMAL LOW (ref 135–145)

## 2021-03-13 LAB — URINALYSIS, ROUTINE W REFLEX MICROSCOPIC
Bilirubin Urine: NEGATIVE
Glucose, UA: NEGATIVE mg/dL
Ketones, ur: NEGATIVE mg/dL
Leukocytes,Ua: NEGATIVE
Nitrite: NEGATIVE
Protein, ur: NEGATIVE mg/dL
Specific Gravity, Urine: 1.019 (ref 1.005–1.030)
pH: 5 (ref 5.0–8.0)

## 2021-03-13 MED ORDER — METOPROLOL TARTRATE 25 MG PO TABS
25.0000 mg | ORAL_TABLET | Freq: Two times a day (BID) | ORAL | Status: DC
  Administered 2021-03-13 (×2): 25 mg via ORAL
  Filled 2021-03-13 (×2): qty 1

## 2021-03-13 MED ORDER — POTASSIUM CHLORIDE CRYS ER 20 MEQ PO TBCR
20.0000 meq | EXTENDED_RELEASE_TABLET | Freq: Every day | ORAL | Status: DC
  Administered 2021-03-13 – 2021-03-16 (×4): 20 meq via ORAL
  Filled 2021-03-13 (×4): qty 1

## 2021-03-13 MED ORDER — METOPROLOL TARTRATE 12.5 MG HALF TABLET: 12.5000 mg | ORAL_TABLET | Freq: Two times a day (BID) | ORAL | Status: DC

## 2021-03-13 MED ORDER — METOPROLOL TARTRATE 25 MG/10 ML ORAL SUSPENSION: 25.0000 mg | Freq: Two times a day (BID) | ORAL | Status: DC

## 2021-03-13 MED ORDER — METOPROLOL TARTRATE 25 MG/10 ML ORAL SUSPENSION: 12.5000 mg | Freq: Two times a day (BID) | ORAL | Status: DC

## 2021-03-13 MED ORDER — QUETIAPINE FUMARATE 25 MG PO TABS: 50.0000 mg | ORAL_TABLET | Freq: Every day | ORAL | Status: DC

## 2021-03-13 MED ORDER — METOPROLOL TARTRATE 25 MG PO TABS: 25.0000 mg | ORAL_TABLET | Freq: Two times a day (BID) | ORAL | Status: DC

## 2021-03-13 MED ORDER — FUROSEMIDE 40 MG PO TABS
40.0000 mg | ORAL_TABLET | Freq: Every day | ORAL | Status: DC
  Administered 2021-03-13 – 2021-03-16 (×4): 40 mg via ORAL
  Filled 2021-03-13 (×4): qty 1

## 2021-03-13 MED ORDER — APIXABAN 2.5 MG PO TABS
2.5000 mg | ORAL_TABLET | Freq: Two times a day (BID) | ORAL | Status: DC
  Administered 2021-03-13 – 2021-03-16 (×7): 2.5 mg via ORAL
  Filled 2021-03-13 (×7): qty 1

## 2021-03-13 MED ORDER — HALOPERIDOL 1 MG PO TABS: 1.0000 mg | ORAL_TABLET | Freq: Three times a day (TID) | ORAL | Status: DC | PRN

## 2021-03-13 MED ORDER — HALOPERIDOL LACTATE 5 MG/ML IJ SOLN: 1.0000 mg | Freq: Three times a day (TID) | INTRAMUSCULAR | Status: DC | PRN

## 2021-03-13 MED ORDER — LORAZEPAM 2 MG/ML IJ SOLN
0.5000 mg | Freq: Once | INTRAMUSCULAR | Status: DC
Start: 2021-03-13 — End: 2021-03-16

## 2021-03-13 MED ORDER — OLANZAPINE 2.5 MG PO TABS
2.5000 mg | ORAL_TABLET | Freq: Every day | ORAL | Status: DC
  Administered 2021-03-13 – 2021-03-15 (×3): 2.5 mg via ORAL
  Filled 2021-03-13 (×4): qty 1

## 2021-03-13 NOTE — Progress Notes (Signed)
CARDIAC REHAB PHASE I   Offered to walk with pt. Pt declines at this time states he just got back to bed. Pt states he is in a bad mood today, hoping for d/c tomorrow. Offered to complete education with pt, pt states he is not in the mood for it at this time. Materials at bedside. Will refer to CRP II [place].  [id number] Rufina Falco, RN BSN 03/13/2021 9:53 AM

## 2021-03-13 NOTE — Progress Notes (Signed)
Patient stated that, "he told another nurse that he should have never had the surgery done."

## 2021-03-13 NOTE — Progress Notes (Signed)
HR sustaining 130. 5 mg metoprolol given. HR 90-100s.   Will continue to monitor

## 2021-03-13 NOTE — Consult Note (Signed)
Cardiology Consultation:   Patient ID: [name] MRN: [id number]; DOB: [birth year]  Admit date: 03/09/2021 Date of Consult: 03/13/2021  PCP:  Celene Squibb, MD   [facility] Providers Cardiologist:  Rozann Lesches, MD    Patient Profile:   [name] is a 81 y.o. male with a hx of CAD s/p CABG 2v who is being seen 03/13/2021 for the evaluation of tachycardia-bradycardia at the request of Dr Kipp Brood.  History of Present Illness:   [name] is s/p CABG 2v on 03/09/2021. His post operative course has been complicated by AF w/ RVR. He tells me that he can tell when he is in atrial fibrillation because he feels tired. No syncope.   Past Medical History:  Diagnosis Date   Arthritis    CAD (coronary artery disease)    Two-vessel obstructive disease November 2022   Chronic back pain    CKD (chronic kidney disease) stage 3, GFR 30-59 ml/min (HCC)    Dyspnea    Essential hypertension    GERD (gastroesophageal reflux disease)    Helicobacter pylori gastritis    Treated   History of kidney stones    Hx of adenomatous colonic polyps    IBS (irritable bowel syndrome)    Prostate CA (HCC)    Seed implants   Renal insufficiency    Schatzki's ring    Stroke [facility])     Past Surgical History:  Procedure Laterality Date   APPENDECTOMY  age 61   BACK SURGERY  02/08   [facility]   BACK SURGERY  09/24/04   lumbar, [facility]   BACK SURGERY  09/18/02   [facility]   BILIARY STENT PLACEMENT  01/07/2012   Procedure: BILIARY STENT PLACEMENT;  Surgeon: Rogene Houston, MD;  Location: [facility];  Service: Endoscopy;  Laterality: N/A;   BILIARY STENT PLACEMENT  02/21/2012   Procedure: BILIARY STENT PLACEMENT;  Surgeon: Rogene Houston, MD;  Location: [facility];  Service: Endoscopy;  Laterality: N/A;  Biliary Stent Replacement   CATARACT EXTRACTION W/PHACO  11/22/2010   Procedure: CATARACT EXTRACTION PHACO AND INTRAOCULAR LENS PLACEMENT ([place]);  Surgeon: Williams Che;  Location: [facility];  Service:  Ophthalmology;  Laterality: Right;  CDE: 6.81   CHOLECYSTECTOMY  03/04/05   [facility], [place]. Gangrenous cholecystitis complicated by abscess requiring percutaneous drainage. He also had common duct stones requiring ERCP and sphincterotomy.   COLONOSCOPY  August 2005   Scattered sigmoid diverticulosis, splenic flexure polyp. Hyperplastic   COLONOSCOPY  1997   3 cm tubular adenoma and a sending colon   COLONOSCOPY  02/01/2011   Rourk-friable anal canal, hyperplastic rectal polyp   COLONOSCOPY N/A 08/15/2013   Procedure: COLONOSCOPY;  Surgeon: Rogene Houston, MD;  Location: [facility];  Service: Endoscopy;  Laterality: N/A;  100   COLONOSCOPY N/A 09/20/2018   Procedure: COLONOSCOPY;  Surgeon: Rogene Houston, MD;  Location: [facility];  Service: Endoscopy;  Laterality: N/A;  12:00pm   CORONARY ARTERY BYPASS GRAFT N/A 03/09/2021   Procedure: OFF PUMP CORONARY ARTERY BYPASS GRAFTING (CABG) X2, USING LEFT INTERNAL MAMMARY ARTERY AND RIGHT LEG GREATER SAPHENOUS VEIN HARVESTED ENDOSCOPICALLY;  Surgeon: Lajuana Matte, MD;  Location: [place];  Service: Open Heart Surgery;  Laterality: N/A;  X 2   ERCP  03/02/05   [facility], [facility]. Normal-appearing biliary tree (gallbladder not image), status post sphincterotomy with recovery of small pieces of stone material, status post balloon occlusion cholangiogram.   ERCP  01/07/2012   Procedure:  ENDOSCOPIC RETROGRADE CHOLANGIOPANCREATOGRAPHY (ERCP);  Surgeon: Rogene Houston, MD;  Location: [facility];  Service: Endoscopy;  Laterality: N/A;  possible biliary stenting   ERCP  02/21/2012   Procedure: ENDOSCOPIC RETROGRADE CHOLANGIOPANCREATOGRAPHY (ERCP);  Surgeon: Rogene Houston, MD;  Location: [facility];  Service: Endoscopy;  Laterality: N/A;  common bile duct stone and clip removed   ERCP N/A 05/18/2012   Procedure: ENDOSCOPIC RETROGRADE CHOLANGIOPANCREATOGRAPHY (ERCP);  Surgeon: Rogene Houston, MD;  Location: [facility];  Service: Endoscopy;  Laterality: N/A;  stone and  debri removal   ESOPHAGOGASTRODUODENOSCOPY  August 2005   Erosive esophagitis and Schatzki ring, small hiatal hernia   ESOPHAGOGASTRODUODENOSCOPY  01/2008   Mild reflux esophagitis, small hiatal hernia   ESOPHAGOGASTRODUODENOSCOPY  02/01/2011   Rourk-erosive reflux esophagitis, small hiatal hernia   ESOPHAGOGASTRODUODENOSCOPY  10/26/2011   Dr. Eli Phillips gastric submucosal petechia (bx-benign ulceration), juxta ampullary duodenal diverticulum and some mucosal edema involving 1st/2nd portion of duodenum with superficial erosions (bx-superficial ulceration/benign)   ESOPHAGOGASTRODUODENOSCOPY  02/21/2012   Procedure: ESOPHAGOGASTRODUODENOSCOPY (EGD);  Surgeon: Rogene Houston, MD;  Location: [facility];  Service: Endoscopy;  Laterality: N/A;   EYE SURGERY     Incomplete colonoscopy  December 2009   Left-sided diverticula, mid descending colon polyp, due to recurrent looping and redundancy exam was incomplete. It was felt that the mid colon was reached. Followup barium enema showed colon interposition between the liver and the diaphragm, redundant sigmoid colon but no colon mass or polyp identified.. Pathology revealed tubular adenoma.   LEFT HEART CATH AND CORONARY ANGIOGRAPHY N/A 01/18/2021   Procedure: LEFT HEART CATH AND CORONARY ANGIOGRAPHY;  Surgeon: Lorretta Harp, MD;  Location: [place] [facility];  Service: Cardiovascular;  Laterality: N/A;   POLYPECTOMY  09/20/2018   Procedure: POLYPECTOMY;  Surgeon: Rogene Houston, MD;  [facility];  Service: Endoscopy;;  Hepatic flexure polyp cold snare, splenic flexure polyp   SPHINCTEROTOMY  01/07/2012   Procedure: SPHINCTEROTOMY;  Surgeon: Rogene Houston, MD;  Location: [facility];  Service: Endoscopy;  Laterality: N/A;  Extended   SPYGLASS CHOLANGIOSCOPY  02/21/2012   Procedure: SPYGLASS CHOLANGIOSCOPY;  Surgeon: Rogene Houston, MD;  Location: [facility];  Service: Endoscopy;  Laterality: N/A;   SPYGLASS CHOLANGIOSCOPY N/A 05/18/2012    Procedure: SPYGLASS CHOLANGIOSCOPY;  Surgeon: Rogene Houston, MD;  Location: [facility];  Service: Endoscopy;  Laterality: N/A;   TEE WITHOUT CARDIOVERSION N/A 03/09/2021   Procedure: TRANSESOPHAGEAL ECHOCARDIOGRAM (TEE);  Surgeon: Lajuana Matte, MD;  Location: [place];  Service: Open Heart Surgery;  Laterality: N/A;   TOTAL KNEE ARTHROPLASTY  03/21/2012   Procedure: TOTAL KNEE ARTHROPLASTY;  Surgeon: [name], MD;  Location: [place];  Service: Orthopedics;  Laterality: Right;  right knee arthroplasty   TOTAL KNEE ARTHROPLASTY Left 04/08/2019   Procedure: LEFT TOTAL KNEE ARTHROPLASTY;  Surgeon: Frederik Pear, MD;  Location: [facility];  Service: Orthopedics;  Laterality: Left;       Inpatient Medications: Scheduled Meds:  acetaminophen  1,000 mg Oral Q6H   Or   acetaminophen (TYLENOL) oral liquid 160 mg/5 mL  1,000 mg Per Tube Q6H   aspirin EC  325 mg Oral Daily   Or   aspirin  324 mg Per Tube Daily   atorvastatin  40 mg Oral Daily   bisacodyl  10 mg Oral Daily   Or   bisacodyl  10 mg Rectal Daily   Chlorhexidine Gluconate Cloth  6 each Topical Q0600   clopidogrel  75 mg Oral Daily   docusate sodium  200 mg Oral Daily   enoxaparin (LOVENOX) injection  30 mg Subcutaneous QHS   lidocaine  2 patch Transdermal Q24H   metoprolol tartrate  12.5 mg Oral BID   Or   metoprolol tartrate  12.5 mg Per Tube BID   pantoprazole  40 mg Oral Daily   QUEtiapine  50 mg Oral QHS   sodium chloride flush  3 mL Intravenous Q12H   tamsulosin  0.4 mg Oral QPC supper   Continuous Infusions:  sodium chloride Stopped (03/10/21 0636)   sodium chloride     sodium chloride     sodium chloride     lactated ringers     PRN Meds: sodium chloride, sodium chloride, metoprolol tartrate, ondansetron (ZOFRAN) IV, traMADol  Allergies:   No Known Allergies  Social History:   Social History   Socioeconomic History   Marital status: Married    Spouse name: Not on file   Number of children: 2   Years of  education: Not on file   Highest education level: Not on file  Occupational History   Occupation: Retired Chemical engineer: RETIRED  Tobacco Use   Smoking status: Never   Smokeless tobacco: Never  Vaping Use   Vaping Use: Never used  Substance and Sexual Activity   Alcohol use: No    Alcohol/week: 0.0 standard drinks   Drug use: No   Sexual activity: Not on file  Other Topics Concern   Not on file  Social History Narrative   Not on file   Social Determinants of Health   Financial Resource Strain: Not on file  Food Insecurity: Not on file  Transportation Needs: Not on file  Physical Activity: Not on file  Stress: Not on file  Social Connections: Not on file  Intimate Partner Violence: Not on file    Family History:    Family History  Problem Relation Age of Onset   Diabetes Mother    Coronary artery disease Mother    Coronary artery disease Father    Diabetes Father    Stomach cancer Father    Deep vein thrombosis Daughter    Hypotension Neg Hx    Anesthesia problems Neg Hx    Malignant hyperthermia Neg Hx    Pseudochol deficiency Neg Hx    Colon cancer Neg Hx      ROS:  Please see the history of present illness.   All other ROS reviewed and negative.     Physical Exam/Data:   Vitals:   03/12/21 2342 03/12/21 2358 03/13/21 0433 03/13/21 0738  BP: (!) 95/51 113/73 101/89 (!) 100/52  Pulse: 60 (!) 115 (!) 127 76  Resp: 20 20 20 20   Temp: 97.6 F (36.4 C)  97.6 F (36.4 C) 97.7 F (36.5 C)  TempSrc: Oral  Oral Oral  SpO2: 93% 98% 94% 96%  Weight:   118.7 kg   Height:        Intake/Output Summary (Last 24 hours) at 03/13/2021 1113 Last data filed at 03/13/2021 0256 Gross per 24 hour  Intake 720 ml  Output 900 ml  Net -180 ml   Last 3 Weights 03/13/2021 03/12/2021 03/11/2021  Weight (lbs) 261 lb 11 oz 254 lb 6.6 oz 255 lb 8.2 oz  Weight (kg) 118.7 kg 115.4 kg 115.9 kg     Body mass index is 33.6 kg/m.   General:  Well nourished, well  developed, in no acute distress. Sitting  on side of bed. HEENT: normal Neck: no JVD Vascular: No carotid bruits; Distal pulses 2+ bilaterally Cardiac:  normal S1, S2; RRR; no murmur, sternotomy healing well. Lungs:  clear to auscultation bilaterally, no wheezing, rhonchi or rales  Abd: soft, nontender, no hepatomegaly  Ext: trivial pitting edema in bilateral LE R>L Musculoskeletal:  No deformities, BUE and BLE strength normal and equal Skin: warm and dry  Neuro:  CNs 2-12 intact, no focal abnormalities noted Psych:  Normal affect   EKG:  The EKG was personally reviewed and demonstrates:   03/13/2021 ECG shows RBBB and LAFB with atrial flutter and frequent monomorphic PVCs. - 03/10/2021 ECG shows first degree AV delay, RBBB and LAFB - 03/09/2021 ECG #1 shows sinus bradycardia (HR 20) with RBBB, LAFB and first degree AV delay  Telemetry:  Telemetry was personally reviewed and demonstrates:   Paroxysms of atrial fibrillation and flutter with ventricular rates 110s-130s, PVCs. Sinus rhythm with first degree av delay and BBB   Relevant CV Studies:  02/04/2021 Echo EF50% RV normal   CHA2DS2-VASc Score = 6  The patient's score is based upon: CHF History: 0 HTN History: 1 Diabetes History: 0 Stroke History: 2 Vascular Disease History: 1 Age Score: 2 Gender Score: 0    Signed,  Vickie Epley, MD    03/13/2021 11:13 AM     Laboratory Data:  High Sensitivity Troponin:  No results for input(s): TROPONINIHS in the last 720 hours.   Chemistry Recent Labs  Lab 03/09/21 1805 03/09/21 1843 03/10/21 0315 03/10/21 1719 03/11/21 0355 03/12/21 0147 03/13/21 0042  NA 138   < > 137 135 135 133* 133*  K 4.7   < > 4.2 4.0 4.3 4.4 4.1  CL 109  --  107 101 102 97* 99  CO2 22  --  24 26 26 29 28   GLUCOSE 138*  --  133* 120* 119* 132* 149*  BUN 28*  --  25* 23 24* 37* 46*  CREATININE 1.55*  --  1.48* 1.54* 1.55* 2.16* 1.79*  CALCIUM 8.0*  --  7.9* 8.1* 8.0* 8.4* 8.1*  MG 2.7*   --  2.3 2.3  --   --   --   GFRNONAA 45*  --  47* 45* 45* 30* 38*  ANIONGAP 7  --  6 8 7 7 6    < > = values in this interval not displayed.    No results for input(s): PROT, ALBUMIN, AST, ALT, ALKPHOS, BILITOT in the last 168 hours. Lipids No results for input(s): CHOL, TRIG, HDL, LABVLDL, LDLCALC, CHOLHDL in the last 168 hours.  Hematology Recent Labs  Lab 03/10/21 1719 03/11/21 0355 03/12/21 0147  WBC 8.4 9.2 8.9  RBC 3.38* 3.31* 3.21*  HGB 10.8* 10.5* 9.9*  HCT 33.8* 33.2* 32.3*  MCV 100.0 100.3* 100.6*  MCH 32.0 31.7 30.8  MCHC 32.0 31.6 30.7  RDW 13.3 13.5 13.4  PLT 119* 121* 131*   Thyroid No results for input(s): TSH, FREET4 in the last 168 hours.  BNPNo results for input(s): BNP, PROBNP in the last 168 hours.  DDimer No results for input(s): DDIMER in the last 168 hours.   Radiology/Studies:  DG Chest 2 View  Result Date: 03/12/2021 CLINICAL DATA:  Chest tube placement EXAM: CHEST - 2 VIEW COMPARISON:  Chest radiograph 1 day prior FINDINGS: The right-sided vascular catheter has been removed. The bilateral chest tubes have been removed. Postsurgical changes reflecting CABG are again seen. The cardiomediastinal silhouette is grossly stable. Lung  volumes are low. There is linear opacity over the right midlung which is new since the prior study. Patchy opacities in the left base likely reflect atelectasis. There is no significant pleural effusion. There is no appreciable pneumothorax. Spinal fusion hardware is again seen. There is mild gaseous distention of the bowel in the upper abdomen which may reflect ileus. IMPRESSION: 1. Interval removal of the right-sided vascular catheter and bilateral chest tubes. 2. Linear opacity over the right midlung is new since the prior study. This may reflect atelectasis, though loculated pneumothorax or possibly pneumoperitoneum under the diaphragm are not entirely excluded. Recommend lateral decubitus abdominal and chest radiographs for further  evaluation. Alternatively if the patient cannot go decubitus, short interval follow up radiographs and close clinical monitoring may be considered. 3. Mild gaseous distention of the bowel in the upper abdomen could reflect ileus. These results were called by telephone at the time of interpretation on 03/12/2021 at 8:56 am to provider Jadene Pierini , who verbally acknowledged these results. Electronically Signed   By: Valetta Mole M.D.   On: 03/12/2021 08:57   DG Abd 1 View  Result Date: 03/12/2021 CLINICAL DATA:  Ileus EXAM: ABDOMEN - 1 VIEW COMPARISON:  CT abdomen pelvis 01/26/2021 FINDINGS: Mildly distended colon containing small amount of retained stool. Small bowel nondilated. Mild distension of the stomach. Negative for urinary tract calculi. Mild bilateral pedicle screw and rod fusion thoracic and lumbar spine IMPRESSION: Mild attic distension consistent with ileus. Electronically Signed   By: Franchot Gallo M.D.   On: 03/12/2021 10:14   DG Chest Port 1 View  Result Date: 03/13/2021 CLINICAL DATA:  Chest pain and shortness of breath. EXAM: PORTABLE CHEST 1 VIEW COMPARISON:  Chest radiograph 03/20/2021. FINDINGS: Stable cardiac and mediastinal contours. Elevation of the right hemidiaphragm. Slight improved aeration with improved bibasilar airspace opacities. No pneumothorax. IMPRESSION: Improved aeration with decreasing bibasilar airspace opacities. Electronically Signed   By: Lovey Newcomer M.D.   On: 03/13/2021 10:27   DG Chest Port 1 View  Result Date: 03/11/2021 CLINICAL DATA:  Pleural effusion. EXAM: PORTABLE CHEST 1 VIEW COMPARISON:  03/10/2021 FINDINGS: Sequelae of CABG are again identified. The cardiomediastinal silhouette is unchanged. A right jugular catheter, left chest tube, and right chest tube or mediastinal drain remain in place. Lung volumes remain low with similar appearance of heterogeneous bibasilar opacities. No large pleural effusion or pneumothorax is identified. IMPRESSION: Low lung  volumes with bibasilar atelectasis. No large pleural effusion or pneumothorax. Electronically Signed   By: Logan Bores M.D.   On: 03/11/2021 09:04   DG Chest Port 1 View  Result Date: 03/10/2021 CLINICAL DATA:  Chest tube present post cardiac surgery EXAM: PORTABLE CHEST 1 VIEW COMPARISON:  03/09/2021 FINDINGS: Right IJ central line and bilateral chest tubes remain present. Endotracheal tube is no longer present. Lung volumes remain low with bibasilar atelectasis. Similar cardiomediastinal contours. No pneumothorax. Partially imaged extensive thoracolumbar fusion. IMPRESSION: Lines and tubes as above. Low lung volumes with bibasilar atelectasis. No pneumothorax. Electronically Signed   By: Macy Mis M.D.   On: 03/10/2021 09:45   DG Chest Port 1 View  Result Date: 03/09/2021 CLINICAL DATA:  Postop day 0 status post CABG EXAM: PORTABLE CHEST 1 VIEW COMPARISON:  03/05/2021 FINDINGS: The endotracheal tube tip is 3.2 cm above the projected location of the carina, satisfactorily position. Right internal jugular central venous catheter tip: SVC. Left chest tube noted, right chest tube versus mediastinal drain noted. Posterolateral rod and pedicle screw fixators  in the thoracic spine bilaterally. Low lung volumes. No pneumothorax identified. Scarring or atelectasis the lung bases, right greater than left. Upper normal heart size without compelling findings of cardiogenic edema at this time. Atherosclerotic calcification of the aortic arch. IMPRESSION: 1. Postop day 0 status post CABG with tubes and lines satisfactorily positioned. No visible pneumothorax. 2. Mild atelectasis at both lung bases.  Low lung volumes. 3.  Aortic Atherosclerosis (ICD10-I70.0). Electronically Signed   By: Van Clines M.D.   On: 03/09/2021 12:27     Assessment and Plan:   Mr Wombles is an 81yo man who is post op from 2v CABG. His post op course has been complicated by atrial fibrillation with rapid ventricular rates. He has  significant baseline conduction system disease (RBBB, first degree AV block, LAFB).   Tachy-Brady Syndrome Atrial fibrillation, post operative  Continue metoprolol but recommend increasing dose to 25mg  PO BID. Further uptitration as his BP/HR tolerates. Recommend anticoagulation given elevated CHADSVASc of 6. Defer to CTS re: timing of this Recommend discharge with a 2 week Zio live monitor for ongoing survillance for higher degree AV block, worsening sinus node dysfunction. Follow up with EP as outpatient, I will send a msg to arrange this.  I'll follow him while he is inpatient.   For questions or updates, please contact [place] Please consult [email or web address] for contact info under    Signed, Vickie Epley, MD  03/13/2021 11:13 AM

## 2021-03-13 NOTE — Progress Notes (Signed)
Mobility Specialist: Progress Note   03/13/21 1232  Mobility  Bed Position Chair  Activity Ambulated with assistance in hallway  Level of Assistance Minimal assist, patient does 75% or more  Assistive Device Front wheel walker  Distance Ambulated (ft) 120 ft  Activity Response Tolerated well  $Mobility charge 1 Mobility   Pre-Mobility: 76 HR, 100/58 BP, 97% SpO2 Post-Mobility: 84 HR, 115/57 BP, 99% SpO2  Pt sitting EOB upon entering room and agreeable to mobility. Ambulated on 6 L/min [place] with sats maintaining 98-100%, no c/o throughout. Pt weaned down to 4 L/min [place] after returning to the room and maintaining 97%, RN notified. Pt to recliner after walk with call bell and phone in reach. Chair alarm is on.   [facility] Velmer Broadfoot Mobility Specialist Mobility Specialist 4 [place]: [phone] Mobility Specialist 2 [place] and [place]: [phone]

## 2021-03-13 NOTE — Progress Notes (Signed)
ANTICOAGULATION CONSULT NOTE - Initial Consult  Pharmacy Consult for apixaban Indication: atrial fibrillation  No Known Allergies  Patient Measurements: Height: 6\' 2"  (188 cm) Weight: 118.7 kg (261 lb 11 oz) IBW/kg (Calculated) : 82.2  Vital Signs: Temp: 98.4 F (36.9 C) (01/21 1131) Temp Source: Oral (01/21 1131) BP: 100/58 (01/21 1131) Pulse Rate: 75 (01/21 1131)  Labs: Recent Labs    03/10/21 1719 03/11/21 0355 03/12/21 0147 03/13/21 0042  HGB 10.8* 10.5* 9.9*  --   HCT 33.8* 33.2* 32.3*  --   PLT 119* 121* 131*  --   CREATININE 1.54* 1.55* 2.16* 1.79*    Estimated Creatinine Clearance: 44.3 mL/min (A) (by C-G formula based on SCr of 1.79 mg/dL (H)).   Medical History: Past Medical History:  Diagnosis Date   Arthritis    CAD (coronary artery disease)    Two-vessel obstructive disease November 2022   Chronic back pain    CKD (chronic kidney disease) stage 3, GFR 30-59 ml/min (HCC)    Dyspnea    Essential hypertension    GERD (gastroesophageal reflux disease)    Helicobacter pylori gastritis    Treated   History of kidney stones    Hx of adenomatous colonic polyps    IBS (irritable bowel syndrome)    Prostate CA (HCC)    Seed implants   Renal insufficiency    Schatzki's ring    Stroke [facility])    Assessment: 81 yo M admitted 1/17 for surgical evaluation of two-vessel coronary artery disease. Now 4-days post-op and found to have atrial fibrillation. Patient was not on anticoagulation PTA.   Age >/= 80 years and Serum Creatinine >/= 1.5, so will plan for dose-reduced apixaban 2.5 mg BID. Hgb slightly down from admission at 9.9 from 12.6 but overall CBC stable. Patient was on Lovenox 30 mg daily for VTE prophylaxis, so ok to start apixaban now.   Goal of Therapy:  Monitor platelets by anticoagulation protocol: Yes   Plan:  Discontinue Lovenox 30 mg daily Start apixaban 2.5 mg BID Daily CBC Monitor s/sx of bleeding  Thank you for including pharmacy in  the care of this patient.  Zenaida Deed, PharmD PGY1 Acute Care Pharmacy Resident  Phone: [phone] 03/13/2021  12:13 PM  Please check [email or web address] for unit-specific pharmacy phone numbers.

## 2021-03-13 NOTE — Progress Notes (Addendum)
[address]       [place] [address]             [phone]      4 Days Post-Op Procedure(s) (LRB): OFF PUMP CORONARY ARTERY BYPASS GRAFTING (CABG) X2, USING LEFT INTERNAL MAMMARY ARTERY AND RIGHT LEG GREATER SAPHENOUS VEIN HARVESTED ENDOSCOPICALLY (N/A) TRANSESOPHAGEAL ECHOCARDIOGRAM (TEE) (N/A) Subjective: Agitated and confused overnight  Objective: Vital signs in last 24 hours: Temp:  [97.6 F (36.4 C)-98.8 F (37.1 C)] 97.7 F (36.5 C) (01/21 0738) Pulse Rate:  [60-127] 76 (01/21 0738) Cardiac Rhythm: Atrial fibrillation;Bundle branch block (01/21 0201) Resp:  [16-27] 20 (01/21 0738) BP: (87-137)/(32-89) 100/52 (01/21 0738) SpO2:  [84 %-98 %] 96 % (01/21 0738) Weight:  [118.7 kg] 118.7 kg (01/21 0433)  Hemodynamic parameters for last 24 hours:    Intake/Output from previous day: 01/20 0701 - 01/21 0700 In: 1200 [P.O.:1200] Out: 1300 [Urine:1300] Intake/Output this shift: No intake/output data recorded.  General appearance: alert, cooperative, and no distress Heart: regular rate and rhythm Lungs: dim in right base Abdomen: some distension, soft, non tender, obese Extremities: + edema Wound: incis healing well  Lab Results: Recent Labs    03/11/21 0355 03/12/21 0147  WBC 9.2 8.9  HGB 10.5* 9.9*  HCT 33.2* 32.3*  PLT 121* 131*   BMET:  Recent Labs    03/12/21 0147 03/13/21 0042  NA 133* 133*  K 4.4 4.1  CL 97* 99  CO2 29 28  GLUCOSE 132* 149*  BUN 37* 46*  CREATININE 2.16* 1.79*  CALCIUM 8.4* 8.1*    PT/INR: No results for input(s): LABPROT, INR in the last 72 hours. ABG    Component Value Date/Time   PHART 7.314 (L) 03/09/2021 2008   HCO3 20.8 03/09/2021 2008   TCO2 22 03/09/2021 2008   ACIDBASEDEF 5.0 (H) 03/09/2021 2008   O2SAT 94.0 03/09/2021 2008   CBG (last 3)  Recent Labs    03/12/21 0617 03/12/21 1118 03/12/21 1558  GLUCAP 133* 119* 115*    Meds Scheduled Meds:  acetaminophen  1,000 mg Oral Q6H    Or   acetaminophen (TYLENOL) oral liquid 160 mg/5 mL  1,000 mg Per Tube Q6H   aspirin EC  325 mg Oral Daily   Or   aspirin  324 mg Per Tube Daily   atorvastatin  40 mg Oral Daily   bisacodyl  10 mg Oral Daily   Or   bisacodyl  10 mg Rectal Daily   Chlorhexidine Gluconate Cloth  6 each Topical Q0600   clopidogrel  75 mg Oral Daily   docusate sodium  200 mg Oral Daily   enoxaparin (LOVENOX) injection  30 mg Subcutaneous QHS   lidocaine  2 patch Transdermal Q24H   metoprolol tartrate  12.5 mg Oral BID   Or   metoprolol tartrate  12.5 mg Per Tube BID   pantoprazole  40 mg Oral Daily   sodium chloride flush  3 mL Intravenous Q12H   tamsulosin  0.4 mg Oral QPC supper   Continuous Infusions:  sodium chloride Stopped (03/10/21 0636)   sodium chloride     sodium chloride     sodium chloride     lactated ringers     PRN Meds:.sodium chloride, sodium chloride, cyclobenzaprine, fentaNYL (SUBLIMAZE) injection, metoprolol tartrate, ondansetron (ZOFRAN) IV, oxyCODONE, traMADol  Xrays DG Chest 2 View  Result Date: 03/12/2021 CLINICAL DATA:  Chest tube placement EXAM: CHEST - 2 VIEW COMPARISON:  Chest  radiograph 1 day prior FINDINGS: The right-sided vascular catheter has been removed. The bilateral chest tubes have been removed. Postsurgical changes reflecting CABG are again seen. The cardiomediastinal silhouette is grossly stable. Lung volumes are low. There is linear opacity over the right midlung which is new since the prior study. Patchy opacities in the left base likely reflect atelectasis. There is no significant pleural effusion. There is no appreciable pneumothorax. Spinal fusion hardware is again seen. There is mild gaseous distention of the bowel in the upper abdomen which may reflect ileus. IMPRESSION: 1. Interval removal of the right-sided vascular catheter and bilateral chest tubes. 2. Linear opacity over the right midlung is new since the prior study. This may reflect atelectasis,  though loculated pneumothorax or possibly pneumoperitoneum under the diaphragm are not entirely excluded. Recommend lateral decubitus abdominal and chest radiographs for further evaluation. Alternatively if the patient cannot go decubitus, short interval follow up radiographs and close clinical monitoring may be considered. 3. Mild gaseous distention of the bowel in the upper abdomen could reflect ileus. These results were called by telephone at the time of interpretation on 03/12/2021 at 8:56 am to provider Jadene Pierini , who verbally acknowledged these results. Electronically Signed   By: Valetta Mole M.D.   On: 03/12/2021 08:57   DG Abd 1 View  Result Date: 03/12/2021 CLINICAL DATA:  Ileus EXAM: ABDOMEN - 1 VIEW COMPARISON:  CT abdomen pelvis 01/26/2021 FINDINGS: Mildly distended colon containing small amount of retained stool. Small bowel nondilated. Mild distension of the stomach. Negative for urinary tract calculi. Mild bilateral pedicle screw and rod fusion thoracic and lumbar spine IMPRESSION: Mild attic distension consistent with ileus. Electronically Signed   By: Franchot Gallo M.D.   On: 03/12/2021 10:14    Assessment/Plan: S/P Procedure(s) (LRB): OFF PUMP CORONARY ARTERY BYPASS GRAFTING (CABG) X2, USING LEFT INTERNAL MAMMARY ARTERY AND RIGHT LEG GREATER SAPHENOUS VEIN HARVESTED ENDOSCOPICALLY (N/A) TRANSESOPHAGEAL ECHOCARDIOGRAM (TEE) (N/A)  POD#4  1 afeb, VSS S BP 87-137, mostly 100's- 110's-BBB, bifascicular, PVC's ? Some afib with RVR- will need cardiology input 2 sats ok on 4 liters 3 adeq UOP but weight trending higher if accurate, about 4 kg over preop- may need to consider further diuretics 4 Creat trending down, off diuretics , has baseline renal insuff( creat 1.4-1.5's, was on losartan preop 5 CXR appears to have increasing right effus, atx- repeat 2 view in am 6 BS control good- not a diabetic 7 discontinue oxycodone/flexeril /fentanyl with MS changes 8 may need to consider PRN  haldol or other agent at night 9 lovenox for DVT ppx 10 has had BM- ileus much improved clinically- advance diet 11 hopefully home 1-2 days  LOS: 4 days    John Giovanni PA-C  03/13/2021   Patient seen and examined, agree with above Less confused this AM bit still issues with impulse control Creatinine better will diurese  Remo Lipps C. Roxan Hockey, MD [facility] [phone]

## 2021-03-13 NOTE — Progress Notes (Signed)
HR increased to 120s. Patient still having confusion and has to be reoriented.

## 2021-03-13 NOTE — Progress Notes (Signed)
EKG done to confirm rhythm. EKG placed in chart.   Will continue to monitor

## 2021-03-13 NOTE — Plan of Care (Signed)
  Problem: Clinical Measurements: Goal: Respiratory complications will improve Outcome: Not Progressing   

## 2021-03-14 ENCOUNTER — Inpatient Hospital Stay (HOSPITAL_COMMUNITY): Payer: PPO

## 2021-03-14 LAB — CBC
HCT: 27.6 % — ABNORMAL LOW (ref 39.0–52.0)
Hemoglobin: 9 g/dL — ABNORMAL LOW (ref 13.0–17.0)
MCH: 31.9 pg (ref 26.0–34.0)
MCHC: 32.6 g/dL (ref 30.0–36.0)
MCV: 97.9 fL (ref 80.0–100.0)
Platelets: 138 10*3/uL — ABNORMAL LOW (ref 150–400)
RBC: 2.82 MIL/uL — ABNORMAL LOW (ref 4.22–5.81)
RDW: 13.2 % (ref 11.5–15.5)
WBC: 5.4 10*3/uL (ref 4.0–10.5)
nRBC: 0 % (ref 0.0–0.2)

## 2021-03-14 LAB — GLUCOSE, CAPILLARY: Glucose-Capillary: 111 mg/dL — ABNORMAL HIGH (ref 70–99)

## 2021-03-14 LAB — POCT I-STAT 7, (LYTES, BLD GAS, ICA,H+H)
Acid-base deficit: 4 mmol/L — ABNORMAL HIGH (ref 0.0–2.0)
Bicarbonate: 20.9 mmol/L (ref 20.0–28.0)
Calcium, Ion: 1.16 mmol/L (ref 1.15–1.40)
HCT: 32 % — ABNORMAL LOW (ref 39.0–52.0)
Hemoglobin: 10.9 g/dL — ABNORMAL LOW (ref 13.0–17.0)
O2 Saturation: 96 %
Patient temperature: 37.4
Potassium: 4.6 mmol/L (ref 3.5–5.1)
Sodium: 140 mmol/L (ref 135–145)
TCO2: 22 mmol/L (ref 22–32)
pCO2 arterial: 36.4 mmHg (ref 32.0–48.0)
pH, Arterial: 7.369 (ref 7.350–7.450)
pO2, Arterial: 83 mmHg (ref 83.0–108.0)

## 2021-03-14 LAB — BASIC METABOLIC PANEL
Anion gap: 8 (ref 5–15)
BUN: 51 mg/dL — ABNORMAL HIGH (ref 8–23)
CO2: 28 mmol/L (ref 22–32)
Calcium: 8.2 mg/dL — ABNORMAL LOW (ref 8.9–10.3)
Chloride: 101 mmol/L (ref 98–111)
Creatinine, Ser: 1.75 mg/dL — ABNORMAL HIGH (ref 0.61–1.24)
GFR, Estimated: 39 mL/min — ABNORMAL LOW (ref >60)
Glucose, Bld: 121 mg/dL — ABNORMAL HIGH (ref 70–99)
Potassium: 3.7 mmol/L (ref 3.5–5.1)
Sodium: 137 mmol/L (ref 135–145)

## 2021-03-14 LAB — BRAIN NATRIURETIC PEPTIDE: B Natriuretic Peptide: 155 pg/mL — ABNORMAL HIGH (ref 0.0–100.0)

## 2021-03-14 MED ORDER — METOPROLOL TARTRATE 25 MG PO TABS
37.5000 mg | ORAL_TABLET | Freq: Two times a day (BID) | ORAL | Status: DC
  Administered 2021-03-14 – 2021-03-16 (×5): 37.5 mg via ORAL
  Filled 2021-03-14 (×5): qty 1

## 2021-03-14 MED ORDER — METOPROLOL TARTRATE 25 MG/10 ML ORAL SUSPENSION: 37.5000 mg | Freq: Two times a day (BID) | ORAL | Status: DC

## 2021-03-14 NOTE — Progress Notes (Addendum)
[address]       [place] [address]             [phone]      5 Days Post-Op Procedure(s) (LRB): OFF PUMP CORONARY ARTERY BYPASS GRAFTING (CABG) X2, USING LEFT INTERNAL MAMMARY ARTERY AND RIGHT LEG GREATER SAPHENOUS VEIN HARVESTED ENDOSCOPICALLY (N/A) TRANSESOPHAGEAL ECHOCARDIOGRAM (TEE) (N/A) Subjective:  confusion/agitation is a significant issue, better with family around  Objective: Vital signs in last 24 hours: Temp:  [97.3 F (36.3 C)-99.4 F (37.4 C)] 99.4 F (37.4 C) (01/22 0400) Pulse Rate:  [66-75] 69 (01/22 0400) Cardiac Rhythm: Normal sinus rhythm;Bundle branch block (01/21 1930) Resp:  [15-20] 15 (01/22 0533) BP: (100-120)/(48-61) 110/48 (01/22 0400) SpO2:  [96 %-100 %] 100 % (01/22 0057) Weight:  [117.4 kg] 117.4 kg (01/22 0400)  Hemodynamic parameters for last 24 hours:    Intake/Output from previous day: 01/21 0701 - 01/22 0700 In: 480 [P.O.:480] Out: 900 [Urine:900] Intake/Output this shift: No intake/output data recorded.  General appearance: distracted and no distress Heart: regular rate and rhythm Lungs: dim in bases  Abdomen: soft, non tender Extremities: no edema Wound: incis healing well  Lab Results: Recent Labs    03/12/21 0147 03/14/21 0055  WBC 8.9 5.4  HGB 9.9* 9.0*  HCT 32.3* 27.6*  PLT 131* 138*   BMET:  Recent Labs    03/13/21 0042 03/14/21 0055  NA 133* 137  K 4.1 3.7  CL 99 101  CO2 28 28  GLUCOSE 149* 121*  BUN 46* 51*  CREATININE 1.79* 1.75*  CALCIUM 8.1* 8.2*    PT/INR: No results for input(s): LABPROT, INR in the last 72 hours. ABG    Component Value Date/Time   PHART 7.314 (L) 03/09/2021 2008   HCO3 20.8 03/09/2021 2008   TCO2 22 03/09/2021 2008   ACIDBASEDEF 5.0 (H) 03/09/2021 2008   O2SAT 94.0 03/09/2021 2008   CBG (last 3)  Recent Labs    03/12/21 1118 03/12/21 1558 03/14/21 0718  GLUCAP 119* 115* 111*    Meds Scheduled Meds:  acetaminophen  1,000 mg Oral Q6H    Or   acetaminophen (TYLENOL) oral liquid 160 mg/5 mL  1,000 mg Per Tube Q6H   apixaban  2.5 mg Oral BID   aspirin EC  325 mg Oral Daily   Or   aspirin  324 mg Per Tube Daily   atorvastatin  40 mg Oral Daily   bisacodyl  10 mg Oral Daily   Or   bisacodyl  10 mg Rectal Daily   Chlorhexidine Gluconate Cloth  6 each Topical Q0600   docusate sodium  200 mg Oral Daily   furosemide  40 mg Oral Daily   lidocaine  2 patch Transdermal Q24H   LORazepam  0.5 mg Intravenous Once   metoprolol tartrate  25 mg Oral BID   Or   metoprolol tartrate  25 mg Per Tube BID   OLANZapine  2.5 mg Oral QHS   pantoprazole  40 mg Oral Daily   potassium chloride  20 mEq Oral Daily   sodium chloride flush  3 mL Intravenous Q12H   tamsulosin  0.4 mg Oral QPC supper   Continuous Infusions:  sodium chloride Stopped (03/10/21 0636)   sodium chloride     sodium chloride     sodium chloride     lactated ringers     PRN Meds:.sodium chloride, sodium chloride, metoprolol tartrate, ondansetron (ZOFRAN) IV, traMADol  Xrays DG  Abd 1 View  Result Date: 03/12/2021 CLINICAL DATA:  Ileus EXAM: ABDOMEN - 1 VIEW COMPARISON:  CT abdomen pelvis 01/26/2021 FINDINGS: Mildly distended colon containing small amount of retained stool. Small bowel nondilated. Mild distension of the stomach. Negative for urinary tract calculi. Mild bilateral pedicle screw and rod fusion thoracic and lumbar spine IMPRESSION: Mild attic distension consistent with ileus. Electronically Signed   By: Franchot Gallo M.D.   On: 03/12/2021 10:14   DG Chest Port 1 View  Result Date: 03/13/2021 CLINICAL DATA:  Chest pain and shortness of breath. EXAM: PORTABLE CHEST 1 VIEW COMPARISON:  Chest radiograph 03/20/2021. FINDINGS: Stable cardiac and mediastinal contours. Elevation of the right hemidiaphragm. Slight improved aeration with improved bibasilar airspace opacities. No pneumothorax. IMPRESSION: Improved aeration with decreasing bibasilar airspace opacities.  Electronically Signed   By: Lovey Newcomer M.D.   On: 03/13/2021 10:27    Assessment/Plan: S/P Procedure(s) (LRB): OFF PUMP CORONARY ARTERY BYPASS GRAFTING (CABG) X2, USING LEFT INTERNAL MAMMARY ARTERY AND RIGHT LEG GREATER SAPHENOUS VEIN HARVESTED ENDOSCOPICALLY (N/A) TRANSESOPHAGEAL ECHOCARDIOGRAM (TEE) (N/A) POD#5   1 Tmax 99.4, VSS 2 sats ok on 1.5 liters, ranges from 0-4 liters yesterday desats to low 80's if off 3 good UOP, weight trending lower, about 3 KG over preop- cont to diurese- poss increase dose 4 creat pretty stable at 1.75 5 HCT dropped from 32 to 27, getting diuresed and equilibrating, some HGB/RBC's on UA, he does have H/O kidney stones, more likely recent foley for surgery- monitor clinically. On lovenox for DVT ppx 6 thrombocytopenia conts to show improving trend 7 MS changes , most likely delerium /sundowning, limit pain meds as able, zyprexa and prn ativan, limit to inpatient use  better for QT interval than some of the other options. May need neuro assist if not better soon. Will get sitter 8 cardiology assisting with rhythm issues, added eliquis and stopped plavix, CHADSVASc of 6- risks noted , pharmacy assisting with dosing. SR, PVC's 8 CXR bilat ASD, ? Right hemidiaphragm elev, check pBNP    LOS: 5 days    John Giovanni PA-C Pager [phone] 03/14/2021   He is calm at present but delirium remains a significant issue. Meds for delirium as above Appreciate Dr. Mardene Speak assistance with rhythm management  Remo Lipps C. Roxan Hockey, MD [facility] [phone]

## 2021-03-14 NOTE — Progress Notes (Signed)
Progress Note  Patient Name: [name] Date of Encounter: 03/14/2021  [facility] Cardiologist: Walter Lesches, MD   Subjective   NAEO. No complaints this AM.  Inpatient Medications    Scheduled Meds:  acetaminophen  1,000 mg Oral Q6H   Or   acetaminophen (TYLENOL) oral liquid 160 mg/5 mL  1,000 mg Per Tube Q6H   apixaban  2.5 mg Oral BID   aspirin EC  325 mg Oral Daily   Or   aspirin  324 mg Per Tube Daily   atorvastatin  40 mg Oral Daily   bisacodyl  10 mg Oral Daily   Or   bisacodyl  10 mg Rectal Daily   Chlorhexidine Gluconate Cloth  6 each Topical Q0600   docusate sodium  200 mg Oral Daily   furosemide  40 mg Oral Daily   lidocaine  2 patch Transdermal Q24H   LORazepam  0.5 mg Intravenous Once   metoprolol tartrate  37.5 mg Oral BID   Or   metoprolol tartrate  37.5 mg Per Tube BID   OLANZapine  2.5 mg Oral QHS   pantoprazole  40 mg Oral Daily   potassium chloride  20 mEq Oral Daily   sodium chloride flush  3 mL Intravenous Q12H   tamsulosin  0.4 mg Oral QPC supper   Continuous Infusions:  sodium chloride Stopped (03/10/21 0636)   sodium chloride     sodium chloride     sodium chloride     lactated ringers     PRN Meds: sodium chloride, sodium chloride, metoprolol tartrate, ondansetron (ZOFRAN) IV, traMADol   Vital Signs    Vitals:   03/14/21 0057 03/14/21 0400 03/14/21 0533 03/14/21 0843  BP: 120/60 (!) 110/48  (!) 107/58  Pulse: 66 69  73  Resp: 20 19 15 19   Temp: 98.8 F (37.1 C) 99.4 F (37.4 C)  98.9 F (37.2 C)  TempSrc: Oral Axillary  Axillary  SpO2: 100%   93%  Weight:  117.4 kg    Height:        Intake/Output Summary (Last 24 hours) at 03/14/2021 0902 Last data filed at 03/13/2021 1800 Gross per 24 hour  Intake 360 ml  Output 900 ml  Net -540 ml   Last 3 Weights 03/14/2021 03/13/2021 03/12/2021  Weight (lbs) 258 lb 13.1 oz 261 lb 11 oz 254 lb 6.6 oz  Weight (kg) 117.4 kg 118.7 kg 115.4 kg      Telemetry    Sinus in the  70s - Personally Reviewed  ECG    Personally Reviewed  Physical Exam   GEN: No acute distress.  In bed at 30 degrees Neck: No JVD Cardiac: RRR, no murmurs, rubs, or gallops. Sternotomy healing well. Respiratory: Clear to auscultation bilaterally. GI: Soft, nontender, non-distended  MS: No edema; No deformity. Neuro:  Nonfocal  Psych: Normal affect   Labs    High Sensitivity Troponin:  No results for input(s): TROPONINIHS in the last 720 hours.   Chemistry Recent Labs  Lab 03/09/21 1805 03/09/21 1843 03/10/21 0315 03/10/21 1719 03/11/21 0355 03/12/21 0147 03/13/21 0042 03/14/21 0055  NA 138   < > 137 135   < > 133* 133* 137  K 4.7   < > 4.2 4.0   < > 4.4 4.1 3.7  CL 109  --  107 101   < > 97* 99 101  CO2 22  --  24 26   < > 29 28 28   GLUCOSE  138*  --  133* 120*   < > 132* 149* 121*  BUN 28*  --  25* 23   < > 37* 46* 51*  CREATININE 1.55*  --  1.48* 1.54*   < > 2.16* 1.79* 1.75*  CALCIUM 8.0*  --  7.9* 8.1*   < > 8.4* 8.1* 8.2*  MG 2.7*  --  2.3 2.3  --   --   --   --   GFRNONAA 45*  --  47* 45*   < > 30* 38* 39*  ANIONGAP 7  --  6 8   < > 7 6 8    < > = values in this interval not displayed.    Lipids No results for input(s): CHOL, TRIG, HDL, LABVLDL, LDLCALC, CHOLHDL in the last 168 hours.  Hematology Recent Labs  Lab 03/11/21 0355 03/12/21 0147 03/14/21 0055  WBC 9.2 8.9 5.4  RBC 3.31* 3.21* 2.82*  HGB 10.5* 9.9* 9.0*  HCT 33.2* 32.3* 27.6*  MCV 100.3* 100.6* 97.9  MCH 31.7 30.8 31.9  MCHC 31.6 30.7 32.6  RDW 13.5 13.4 13.2  PLT 121* 131* 138*   Thyroid No results for input(s): TSH, FREET4 in the last 168 hours.  BNPNo results for input(s): BNP, PROBNP in the last 168 hours.  DDimer No results for input(s): DDIMER in the last 168 hours.   Radiology    DG Chest 2 View  Result Date: 03/14/2021 CLINICAL DATA:  Status post CABG. EXAM: CHEST - 2 VIEW COMPARISON:  Chest radiograph 03/13/2021. FINDINGS: Monitoring leads overlie the patient. Patient status  post median sternotomy. Stable enlarged cardiac and mediastinal contours. Elevation of the right hemidiaphragm. Low lung volume exam. Bibasilar airspace opacities, similar to prior. No definite pleural effusion or pneumothorax. IMPRESSION: Low lung volume exam with similar bibasilar airspace opacities. Electronically Signed   By: [name] M.D.   On: 03/14/2021 08:19   DG Chest Port 1 View  Result Date: 03/13/2021 CLINICAL DATA:  Chest pain and shortness of breath. EXAM: PORTABLE CHEST 1 VIEW COMPARISON:  Chest radiograph 03/20/2021. FINDINGS: Stable cardiac and mediastinal contours. Elevation of the right hemidiaphragm. Slight improved aeration with improved bibasilar airspace opacities. No pneumothorax. IMPRESSION: Improved aeration with decreasing bibasilar airspace opacities. Electronically Signed   By: [name] M.D.   On: 03/13/2021 10:27    Cardiac Studies       Assessment & Plan    Mr [name] is an 81yo man who is post op from 2v CABG. His post op course has been complicated by atrial fibrillation with rapid ventricular rates. He has significant baseline conduction system disease (RBBB, first degree AV block, LAFB).    Tachy-Brady Syndrome Atrial fibrillation, post operative   Continue metoprolol, increase dose to 37.5mg  PO BID. Further uptitration as his BP/HR tolerates.  Recommend anticoagulation given elevated CHADSVASc of 6. Defer to CTS re: timing of this  Recommend discharge with a 2 week Zio live monitor for ongoing survillance for higher degree AV block, worsening sinus node dysfunction.  Follow up with EP as outpatient, I will send a msg to arrange this.   I'll follow him while he is inpatient.    For questions or updates, please contact [facility] Please consult [email or web address] for contact info under        Signed, Walter Epley, MD  03/14/2021, 9:02 AM

## 2021-03-14 NOTE — Progress Notes (Signed)
Electrophysiology Rounding Note  Patient Name: MANPREET STREY Date of Encounter: 03/15/21   Primary Cardiologist: Rozann Lesches, MD Electrophysiologist: Dr. Quentin Ore   Subjective   The patient is doing well today.  At this time, the patient denies chest pain, shortness of breath, or any new concerns. Wants to go home.  Inpatient Medications    Scheduled Meds:  acetaminophen  1,000 mg Oral Q6H   Or   acetaminophen (TYLENOL) oral liquid 160 mg/5 mL  1,000 mg Per Tube Q6H   apixaban  2.5 mg Oral BID   aspirin EC  325 mg Oral Daily   Or   aspirin  324 mg Per Tube Daily   atorvastatin  40 mg Oral Daily   bisacodyl  10 mg Oral Daily   Or   bisacodyl  10 mg Rectal Daily   Chlorhexidine Gluconate Cloth  6 each Topical Q0600   docusate sodium  200 mg Oral Daily   furosemide  40 mg Oral Daily   lidocaine  2 patch Transdermal Q24H   LORazepam  0.5 mg Intravenous Once   metoprolol tartrate  37.5 mg Oral BID   Or   metoprolol tartrate  37.5 mg Per Tube BID   OLANZapine  2.5 mg Oral QHS   pantoprazole  40 mg Oral Daily   potassium chloride  20 mEq Oral Daily   sodium chloride flush  3 mL Intravenous Q12H   tamsulosin  0.4 mg Oral QPC supper   Continuous Infusions:  sodium chloride Stopped (03/10/21 0636)   sodium chloride     sodium chloride     sodium chloride     lactated ringers     PRN Meds: sodium chloride, sodium chloride, metoprolol tartrate, ondansetron (ZOFRAN) IV, traMADol   Vital Signs    Vitals:   03/14/21 1142 03/14/21 1558 03/14/21 1925 03/14/21 2042  BP: 136/68 (!) 111/54 126/63   Pulse: 78 65 69 73  Resp: 18 20 (!) 22   Temp: 97.9 F (36.6 C) (!) 97.5 F (36.4 C) 97.7 F (36.5 C)   TempSrc: Oral Oral Oral   SpO2: 100% 99% 96%   Weight:      Height:        Intake/Output Summary (Last 24 hours) at 03/14/2021 2154 Last data filed at 03/14/2021 2044 Gross per 24 hour  Intake 363 ml  Output 525 ml  Net -162 ml   Filed Weights   03/12/21 0500  03/13/21 0433 03/14/21 0400  Weight: 115.4 kg 118.7 kg 117.4 kg    Physical Exam    GEN- The patient is well appearing, alert and oriented x 3 today.   Head- normocephalic, atraumatic Eyes-  Sclera clear, conjunctiva pink Ears- hearing intact Oropharynx- clear Neck- supple Lungs- Clear to ausculation bilaterally, normal work of breathing Heart- Regular rate and rhythm, no murmurs, rubs or gallops GI- soft, NT, ND, + BS Extremities- no clubbing or cyanosis. No edema Skin- no rash or lesion Psych- euthymic mood, full affect Neuro- strength and sensation are intact  Labs    CBC Recent Labs    03/12/21 0147 03/14/21 0055  WBC 8.9 5.4  HGB 9.9* 9.0*  HCT 32.3* 27.6*  MCV 100.6* 97.9  PLT 131* 366*   Basic Metabolic Panel Recent Labs    03/13/21 0042 03/14/21 0055  NA 133* 137  K 4.1 3.7  CL 99 101  CO2 28 28  GLUCOSE 149* 121*  BUN 46* 51*  CREATININE 1.79* 1.75*  CALCIUM 8.1* 8.2*  Liver Function Tests No results for input(s): AST, ALT, ALKPHOS, BILITOT, PROT, ALBUMIN in the last 72 hours. No results for input(s): LIPASE, AMYLASE in the last 72 hours. Cardiac Enzymes No results for input(s): CKTOTAL, CKMB, CKMBINDEX, TROPONINI in the last 72 hours.   Telemetry    NSR 50-70s with occasional PVCs (personally reviewed)  Radiology    DG Chest 2 View  Result Date: 03/14/2021 CLINICAL DATA:  Status post CABG. EXAM: CHEST - 2 VIEW COMPARISON:  Chest radiograph 03/13/2021. FINDINGS: Monitoring leads overlie the patient. Patient status post median sternotomy. Stable enlarged cardiac and mediastinal contours. Elevation of the right hemidiaphragm. Low lung volume exam. Bibasilar airspace opacities, similar to prior. No definite pleural effusion or pneumothorax. IMPRESSION: Low lung volume exam with similar bibasilar airspace opacities. Electronically Signed   By: Lovey Newcomer M.D.   On: 03/14/2021 08:19   DG Chest Port 1 View  Result Date: 03/13/2021 CLINICAL DATA:   Chest pain and shortness of breath. EXAM: PORTABLE CHEST 1 VIEW COMPARISON:  Chest radiograph 03/20/2021. FINDINGS: Stable cardiac and mediastinal contours. Elevation of the right hemidiaphragm. Slight improved aeration with improved bibasilar airspace opacities. No pneumothorax. IMPRESSION: Improved aeration with decreasing bibasilar airspace opacities. Electronically Signed   By: Lovey Newcomer M.D.   On: 03/13/2021 10:27    Patient Profile     Mr Scurlock is an 81yo man who is post op from 2v CABG. His post op course has been complicated by atrial fibrillation with rapid ventricular rates. He has significant baseline conduction system disease (RBBB, first degree AV block, LAFB).   Assessment & Plan    Tachy-Brady Syndrome Atrial fibrillation, post operative   Continue metoprolol 37.5 mg BID. Continue uptitration as his BP/HR tolerates.   Recommend anticoagulation given elevated CHADSVASc of 6. Defer to CTS re: timing of this   Will plan with a 2 week Zio live monitor for ongoing survillance for higher degree AV block, worsening sinus node dysfunction.    For questions or updates, please contact [facility] Please consult [email or web address] for contact info under Cardiology/STEMI.  Signed, Shirley Friar, PA-C  03/14/2021, 9:54 PM

## 2021-03-15 LAB — BASIC METABOLIC PANEL
Anion gap: 8 (ref 5–15)
BUN: 49 mg/dL — ABNORMAL HIGH (ref 8–23)
CO2: 25 mmol/L (ref 22–32)
Calcium: 8.2 mg/dL — ABNORMAL LOW (ref 8.9–10.3)
Chloride: 106 mmol/L (ref 98–111)
Creatinine, Ser: 1.65 mg/dL — ABNORMAL HIGH (ref 0.61–1.24)
GFR, Estimated: 41 mL/min — ABNORMAL LOW (ref >60)
Glucose, Bld: 125 mg/dL — ABNORMAL HIGH (ref 70–99)
Potassium: 3.9 mmol/L (ref 3.5–5.1)
Sodium: 139 mmol/L (ref 135–145)

## 2021-03-15 LAB — CBC
HCT: 30.3 % — ABNORMAL LOW (ref 39.0–52.0)
Hemoglobin: 9.9 g/dL — ABNORMAL LOW (ref 13.0–17.0)
MCH: 32.1 pg (ref 26.0–34.0)
MCHC: 32.7 g/dL (ref 30.0–36.0)
MCV: 98.4 fL (ref 80.0–100.0)
Platelets: 156 10*3/uL (ref 150–400)
RBC: 3.08 MIL/uL — ABNORMAL LOW (ref 4.22–5.81)
RDW: 13.1 % (ref 11.5–15.5)
WBC: 5.1 10*3/uL (ref 4.0–10.5)
nRBC: 0 % (ref 0.0–0.2)

## 2021-03-15 NOTE — Care Management Important Message (Signed)
Important Message  Patient Details  Name: [name] MRN: [id number] Date of Birth: [birth year]   Medicare Important Message Given:  Yes     Shelda Altes 03/15/2021, 10:14 AM

## 2021-03-15 NOTE — Discharge Instructions (Addendum)
Discharge Instructions:  1. You may shower, please wash incisions daily with soap and water and keep dry.  If you wish to cover wounds with dressing you may do so but please keep clean and change daily.  No tub baths or swimming until incisions have completely healed.  If your incisions become red or develop any drainage please call our office at [phone]  2. No Driving until cleared by Dr. Lightfoot's office and you are no longer using narcotic pain medications  3. Monitor your weight daily.. Please use the same scale and weigh at same time... If you gain 5-10 lbs in 48 hours with associated lower extremity swelling, please contact our office at [phone]  4. Fever of 101.5 for at least 24 hours with no source, please contact our office at [phone]  5. Activity- up as tolerated, please walk at least 3 times per day.  Avoid strenuous activity, no lifting, pushing, or pulling with your arms over 8-10 lbs for a minimum of 6 weeks  6. If any questions or concerns arise, please do not hesitate to contact our office at [phone]  Information on my medicine - ELIQUIS (apixaban)  This medication education was reviewed with me or my healthcare representative as part of my discharge preparation.  The pharmacist that spoke with me during my hospital stay was:  Carney, Jessica C, RPH  Why was Eliquis prescribed for you? Eliquis was prescribed for you to reduce the risk of a blood clot forming that can cause a stroke if you have a medical condition called atrial fibrillation (a type of irregular heartbeat).  What do You need to know about Eliquis ? Take your Eliquis TWICE DAILY - one tablet in the morning and one tablet in the evening with or without food. If you have difficulty swallowing the tablet whole please discuss with your pharmacist how to take the medication safely.  Take Eliquis exactly as prescribed by your doctor and DO NOT stop taking Eliquis without talking to the  doctor who prescribed the medication.  Stopping may increase your risk of developing a stroke.  Refill your prescription before you run out.  After discharge, you should have regular check-up appointments with your healthcare provider that is prescribing your Eliquis.  In the future your dose may need to be changed if your kidney function or weight changes by a significant amount or as you get older.  What do you do if you miss a dose? If you miss a dose, take it as soon as you remember on the same day and resume taking twice daily.  Do not take more than one dose of ELIQUIS at the same time to make up a missed dose.  Important Safety Information A possible side effect of Eliquis is bleeding. You should call your healthcare provider right away if you experience any of the following: Bleeding from an injury or your nose that does not stop. Unusual colored urine (red or dark brown) or unusual colored stools (red or black). Unusual bruising for unknown reasons. A serious fall or if you hit your head (even if there is no bleeding).  Some medicines may interact with Eliquis and might increase your risk of bleeding or clotting while on Eliquis. To help avoid this, consult your healthcare provider or pharmacist prior to using any new prescription or non-prescription medications, including herbals, vitamins, non-steroidal anti-inflammatory drugs (NSAIDs) and supplements.  This website has more information on Eliquis (apixaban): http://www.eliquis.com/eliquis/home   

## 2021-03-15 NOTE — Progress Notes (Addendum)
° °   °  [name]       [name] [address]             [phone]      6 Days Post-Op Procedure(s) (LRB): OFF PUMP CORONARY ARTERY BYPASS GRAFTING (CABG) X2, USING LEFT INTERNAL MAMMARY ARTERY AND RIGHT LEG GREATER SAPHENOUS VEIN HARVESTED ENDOSCOPICALLY (N/A) TRANSESOPHAGEAL ECHOCARDIOGRAM (TEE) (N/A)  Subjective:  Patient doing better.  States he wasn't confused last night.  He continues to have back pain.  He thinks if he had a better mattress this wouldn't be as bad.  Objective: Vital signs in last 24 hours: Temp:  [97.5 F (36.4 C)-98.9 F (37.2 C)] 98.3 F (36.8 C) (01/23 0530) Pulse Rate:  [64-78] 65 (01/23 0530) Cardiac Rhythm: Normal sinus rhythm;Bundle branch block (01/22 1947) Resp:  [18-26] 22 (01/23 0530) BP: (107-136)/(48-68) 130/48 (01/23 0530) SpO2:  [93 %-100 %] 97 % (01/23 0530) Weight:  [115.3 kg] 115.3 kg (01/23 0407)  Intake/Output from previous day: 01/22 0701 - 01/23 0700 In: 363 [P.O.:360; I.V.:3] Out: 1150 [Urine:1150]  General appearance: alert, cooperative, and no distress Heart: regular rate and rhythm Lungs: clear to auscultation bilaterally Abdomen: soft, non-tender; bowel sounds normal; no masses,  no organomegaly Extremities: edema mild Wound: clean and dry  Lab Results: Recent Labs    03/14/21 0055 03/15/21 0232  WBC 5.4 5.1  HGB 9.0* 9.9*  HCT 27.6* 30.3*  PLT 138* 156   BMET:  Recent Labs    03/14/21 0055 03/15/21 0232  NA 137 139  K 3.7 3.9  CL 101 106  CO2 28 25  GLUCOSE 121* 125*  BUN 51* 49*  CREATININE 1.75* 1.65*  CALCIUM 8.2* 8.2*    PT/INR: No results for input(s): LABPROT, INR in the last 72 hours. ABG    Component Value Date/Time   PHART 7.314 (L) 03/09/2021 2008   HCO3 20.8 03/09/2021 2008   TCO2 22 03/09/2021 2008   ACIDBASEDEF 5.0 (H) 03/09/2021 2008   O2SAT 94.0 03/09/2021 2008   CBG (last 3)  Recent Labs    03/12/21 1118 03/12/21 1558 03/14/21 0718  GLUCAP 119* 115*  111*    Assessment/Plan: S/P Procedure(s) (LRB): OFF PUMP CORONARY ARTERY BYPASS GRAFTING (CABG) X2, USING LEFT INTERNAL MAMMARY ARTERY AND RIGHT LEG GREATER SAPHENOUS VEIN HARVESTED ENDOSCOPICALLY (N/A) TRANSESOPHAGEAL ECHOCARDIOGRAM (TEE) (N/A)  CV- Tachy/Brady syndrome- on Lopressor 37.5 mg BID.Marland Kitchen EP following, will need Zio per their recommendations at discharge, Eliquis has been started Pulm- weaning oxygen as tolerated, CXR w/o effusions, continue IS Renal- H/O CKD Stg III, baseline creatinine is around 1.5, currently at 1.65, on Lasix, potassium weight is trending done GU- UA is negative for infection, + hgb monitor H/O prostate cancer may need to follow up with urology post discharge Expected post operative blood loss anemia, Hgb at 9.0 Deconditioning- chronic back pain is a problem, continue PT/OT, home health orders have been placed Dispo- patient stable, delirium is improving, he is AAO this morning, continue lasix, potassium wean oxygen as tolerated, continue PT/OT   LOS: 6 days   Ellwood Handler, PA-C 03/15/2021  Agree with above Creat down Continue diuresis and pulm hygiene  Cristian Grieves O Rayley Gao

## 2021-03-15 NOTE — Progress Notes (Signed)
Mobility Specialist: Progress Note   03/15/21 1552  Mobility  Bed Position Chair  Activity Ambulated with assistance in hallway  Level of Assistance Contact guard assist, steadying assist  Assistive Device Front wheel walker  Distance Ambulated (ft) 470 ft  Activity Response Tolerated well  $Mobility charge 1 Mobility   Post-Mobility: 68 HR  Received pt in chair having no complaints and agreeable to mobility. Asymptomatic throughout ambulation, returned back to chair w/ call bell in reach and all needs met.  [facility] Darneshia Demary Mobility Specialist Mobility Specialist 4 [place]: [phone] Mobility Specialist 2 [place] and [place]: [phone]

## 2021-03-15 NOTE — Progress Notes (Signed)
Occupational Therapy Treatment Patient Details Name: [name] MRN: [id number] DOB: 1940-03-26 Today's Date: 03/15/2021   History of present illness Pt adm 1/17 s/p CABG x 2. PMH - multiple back surgeries, bil TKR, chronic back pain, arthritis, ckd, prostate CA.   OT comments  Pt sitting in recliner upon arrival and recently perform functional mobility in hallway with mobility specialist. Providing education on compensatory techniques for UB dressing, LB dressing, and tub transfer. Pt performing UB dressing with Min A (both donning and doffing). Pt verbalized understanding of other techniques but will need further practice. Recommend dc to home and will continue to follow acutely as admitted.     Recommendations for follow up therapy are one component of a multi-disciplinary discharge planning process, led by the attending physician.  Recommendations may be updated based on patient status, additional functional criteria and insurance authorization.    Follow Up Recommendations  No OT follow up    Assistance Recommended at Discharge Intermittent Supervision/Assistance  Patient can return home with the following  A little help with bathing/dressing/bathroom;Assistance with cooking/housework   Equipment Recommendations  None recommended by OT    Recommendations for Other Services      Precautions / Restrictions Precautions Precautions: Fall;Sternal Precaution Booklet Issued: Yes (comment) Precaution Comments: sternal precautions Restrictions Other Position/Activity Restrictions: Sternal precautions       Mobility Bed Mobility               General bed mobility comments: in recliner upon arrival    Transfers                         Balance Overall balance assessment: Needs assistance Sitting-balance support: No upper extremity supported, Feet supported Sitting balance-Leahy Scale: Good                                     ADL either  performed or assessed with clinical judgement   ADL Overall ADL's : Needs assistance/impaired                 Upper Body Dressing : Minimal assistance;Sitting Upper Body Dressing Details (indicate cue type and reason): practice UB dressing with t-shirt. Min A for assisting to pull down shirt in back and to doff due to tightness of shirt   Lower Body Dressing Details (indicate cue type and reason): Educating on figure four. Pt able to bring L ankle to R knee but unable to complete on opposite side           Tub/Shower Transfer Details (indicate cue type and reason): Recommending pt perform side stepping for tub transfer. Pt reports he does this already   General ADL Comments: Focused session on reviewing compensatory techniques for ADLs    Extremity/Trunk Assessment Upper Extremity Assessment Upper Extremity Assessment: Overall WFL for tasks assessed   Lower Extremity Assessment Lower Extremity Assessment: Defer to PT evaluation        Vision       Perception     Praxis      Cognition Arousal/Alertness: Awake/alert Behavior During Therapy: WFL for tasks assessed/performed Overall Cognitive Status: Within Functional Limits for tasks assessed  Exercises      Shoulder Instructions       General Comments VSS on RA    Pertinent Vitals/ Pain       Pain Assessment Pain Assessment: No/denies pain  Home Living                                          Prior Functioning/Environment              Frequency  Min 2X/week        Progress Toward Goals  OT Goals(current goals can now be found in the care plan section)  Progress towards OT goals: Progressing toward goals  Acute Rehab OT Goals OT Goal Formulation: With patient Time For Goal Achievement: 03/26/21 Potential to Achieve Goals: Good ADL Goals Pt Will Perform Grooming: Independently;standing Pt Will Perform Lower  Body Dressing: with modified independence;sit to/from stand;with adaptive equipment Pt Will Transfer to Toilet: with modified independence;ambulating;regular height toilet Pt Will Perform Toileting - Clothing Manipulation and hygiene: with modified independence;sit to/from stand Additional ADL Goal #1: Pt will be Mod I in and OOB for basic ADLs (increased time)  Plan Discharge plan remains appropriate    Co-evaluation                 AM-PAC OT "6 Clicks" Daily Activity     Outcome Measure   Help from another person eating meals?: None Help from another person taking care of personal grooming?: A Little Help from another person toileting, which includes using toliet, bedpan, or urinal?: A Lot Help from another person bathing (including washing, rinsing, drying)?: A Lot Help from another person to put on and taking off regular upper body clothing?: A Little Help from another person to put on and taking off regular lower body clothing?: A Lot 6 Click Score: 16    End of Session    OT Visit Diagnosis: Unsteadiness on feet (R26.81);Muscle weakness (generalized) (M62.81);Pain   Activity Tolerance Patient tolerated treatment well   Patient Left in chair;with call bell/phone within reach;with chair alarm set   Nurse Communication Mobility status        Time: 7588-3254 OT Time Calculation (min): 13 min  Charges: OT General Charges $OT Visit: 1 Visit OT Treatments $Self Care/Home Management : 8-22 mins  [place], OTR/L Acute Rehab Pager: [phone] Office: [place] 03/15/2021, 4:52 PM

## 2021-03-15 NOTE — Progress Notes (Signed)
CARDIAC REHAB PHASE I   PRE:  Rate/Rhythm: 69 SR    BP: sitting 122/58    SaO2: 92 2L  MODE:  Ambulation: 300 ft   POST:  Rate/Rhythm: 104 ST    BP: sitting 153/73     SaO2: 95 1L  Pt with mod assist to stand with gait belt. Needed BSC for gas. Stood better second attempt (from [facility]). Tried RA from bed to [facility] but 86-90 RA therefore ambulated on 1L. Spot check during walk 97 1L. Pt steady walking with RW and 1L. No major c/o. To recliner after walk. Left on RA in recliner, pt feeling well. Encouraged x2 more walks and IS. Pulse ox tends to register low then increase, needs more testing for O2. 1002-1055   Darrick Meigs CES, ACSM 03/15/2021 10:51 AM

## 2021-03-15 NOTE — Progress Notes (Signed)
Physical Therapy Treatment Patient Details Name: [name] MRN: [id number] DOB: 1940-11-02 Today's Date: 03/15/2021   History of Present Illness Pt adm 1/17 a/p CABG x 2. PMH - multiple back surgeries, bil TKR, chronic back pain, arthritis, ckd, prostate CA.    PT Comments    Pt making excellent progress with improved safety awareness, transfers, and gait.  Ambulated 300' on RA with stable sats.  Initial sit to stand with min A but improved to supervision with practice/exercise.  Continue plan of care - add stair training next session.    Recommendations for follow up therapy are one component of a multi-disciplinary discharge planning process, led by the attending physician.  Recommendations may be updated based on patient status, additional functional criteria and insurance authorization.  Follow Up Recommendations  Home health PT     Assistance Recommended at Discharge Intermittent Supervision/Assistance  Patient can return home with the following Help with stairs or ramp for entrance;A little help with walking and/or transfers;A little help with bathing/dressing/bathroom;Assist for transportation   Equipment Recommendations  None recommended by PT    Recommendations for Other Services       Precautions / Restrictions Precautions Precautions: Fall;Sternal Precaution Booklet Issued: Yes (comment) (sternal prec) Restrictions Weight Bearing Restrictions: Yes     Mobility  Bed Mobility               General bed mobility comments: Pt up in chair - has recliner that he can sleep in at home    Transfers Overall transfer level: Needs assistance Equipment used: Rolling walker (2 wheels) Transfers: Sit to/from Stand Sit to Stand: Min assist, Supervision           General transfer comment: Initial sit to stand with min A to rise.  After ambulation performed 5 x sit to stand with supervision.  Cues for hand placement on knees , "nose over toes" and scooting to edge  of chair    Ambulation/Gait Ambulation/Gait assistance: Supervision, Min guard Gait Distance (Feet): 300 Feet Assistive device: Rolling walker (2 wheels) Gait Pattern/deviations: Step-through pattern       General Gait Details: min guard progressing to supervision with min cues for posture   Stairs             Wheelchair Mobility    Modified Rankin (Stroke Patients Only)       Balance Overall balance assessment: Needs assistance Sitting-balance support: No upper extremity supported, Feet supported Sitting balance-Leahy Scale: Good     Standing balance support: No upper extremity supported, Bilateral upper extremity supported Standing balance-Leahy Scale: Fair Standing balance comment: RW to ambulate but could static stand no AD                            Cognition Arousal/Alertness: Awake/alert Behavior During Therapy: WFL for tasks assessed/performed Overall Cognitive Status: Within Functional Limits for tasks assessed                                 General Comments: Oriented, motivated, follows all commands, only minimal cues for sternal prec        Exercises      General Comments General comments (skin integrity, edema, etc.): Pt on RA with sats96-97% throughout session      Pertinent Vitals/Pain Pain Assessment Pain Assessment: No/denies pain    Home Living  Prior Function            PT Goals (current goals can now be found in the care plan section) Progress towards PT goals: Progressing toward goals    Frequency    Min 3X/week      PT Plan Current plan remains appropriate    Co-evaluation              AM-PAC PT "6 Clicks" Mobility   Outcome Measure  Help needed turning from your back to your side while in a flat bed without using bedrails?: None Help needed moving from lying on your back to sitting on the side of a flat bed without using bedrails?: A  Little Help needed moving to and from a bed to a chair (including a wheelchair)?: A Little Help needed standing up from a chair using your arms (e.g., wheelchair or bedside chair)?: A Little Help needed to walk in hospital room?: A Little Help needed climbing 3-5 steps with a railing? : A Little 6 Click Score: 19    End of Session Equipment Utilized During Treatment: Gait belt Activity Tolerance: Patient tolerated treatment well Patient left: in chair;with call bell/phone within reach;with family/visitor present;with chair alarm set Nurse Communication: Mobility status PT Visit Diagnosis: Other abnormalities of gait and mobility (R26.89);Unsteadiness on feet (R26.81);Muscle weakness (generalized) (M62.81)     Time: 8022-3361 PT Time Calculation (min) (ACUTE ONLY): 15 min  Charges:  $Gait Training: 8-22 mins                     Abran Richard, PT Acute Rehab Services Pager [phone] [facility] [place] 03/15/2021, 1:56 PM

## 2021-03-16 ENCOUNTER — Inpatient Hospital Stay (HOSPITAL_COMMUNITY)
Admission: RE | Admit: 2021-03-16 | Discharge: 2021-03-16 | Disposition: A | Discharge status: Home / Self Care | Payer: PPO | Source: Home / Self Care | Attending: Thoracic Surgery (Cardiothoracic Vascular Surgery) | Admitting: Thoracic Surgery (Cardiothoracic Vascular Surgery)

## 2021-03-16 ENCOUNTER — Other Ambulatory Visit (HOSPITAL_COMMUNITY): Payer: Self-pay

## 2021-03-16 DIAGNOSIS — I495 Sick sinus syndrome: Secondary | ICD-10-CM

## 2021-03-16 MED ORDER — METOPROLOL TARTRATE 25 MG PO TABS: 37.5000 mg | ORAL_TABLET | Freq: Two times a day (BID) | ORAL | 3 refills | Status: DC

## 2021-03-16 MED ORDER — TRAMADOL HCL 50 MG PO TABS: 50.0000 mg | ORAL_TABLET | ORAL | 0 refills | Status: DC | PRN

## 2021-03-16 MED ORDER — TAMSULOSIN HCL 0.4 MG PO CAPS: 0.4000 mg | ORAL_CAPSULE | Freq: Every day | ORAL | 0 refills | Status: DC

## 2021-03-16 MED ORDER — POTASSIUM CHLORIDE CRYS ER 20 MEQ PO TBCR: 20.0000 meq | EXTENDED_RELEASE_TABLET | Freq: Every day | ORAL | 0 refills | Status: DC

## 2021-03-16 MED ORDER — APIXABAN 2.5 MG PO TABS: 2.5000 mg | ORAL_TABLET | Freq: Two times a day (BID) | ORAL | 3 refills | Status: DC

## 2021-03-16 MED ORDER — FUROSEMIDE 40 MG PO TABS: 40.0000 mg | ORAL_TABLET | Freq: Every day | ORAL | 0 refills | Status: DC

## 2021-03-16 MED ORDER — ATORVASTATIN CALCIUM 40 MG PO TABS: 40.0000 mg | ORAL_TABLET | Freq: Every day | ORAL | 3 refills | Status: DC

## 2021-03-16 NOTE — TOC Transition Note (Addendum)
Transition of Care (TOC) - CM/SW Discharge Note Marvetta Gibbons RN, BSN Transitions of Care Unit 4E- RN Case Manager See Treatment Team for direct phone #    Patient Details  Name: [name] MRN: [id number] Date of Birth: [birth year]  Transition of Care [facility]) CM/SW Contact:  Dawayne Patricia, RN Phone Number: 03/16/2021, 11:11 AM   Clinical Narrative:    Pt stable for transition home today, notified that pt will need home 02- order has been placed for home 02 DME.  CM spoke with pt at bedside to discuss home 02 arrangements and offer choice- pt states he does not have a preference for home 02 supplier and will defer to this writer to set up with agency.   Confirmed HH arrangements with [facility]- spoke with Stacie. [facility] to contact pt to schedule initial visit.   Call made to [facility] with [employer] for home 02 needs -[employer] will deliver equipment for transport home and give pt info for scheduling delivery of home equipment later today once he is home.    Final next level of care: [place] Barriers to Discharge: Barriers Resolved   Patient Goals and CMS Choice Patient states their goals for this hospitalization and ongoing recovery are:: patient would like to return home. CMS Medicare.gov Compare Post Acute Care list provided to:: Patient Choice offered to / list presented to : Patient, Adult Children  Discharge Placement                 Home w/ [facility]      Discharge Plan and Services In-house Referral: NA Discharge Planning Services: CM Consult Post Acute Care Choice: Home Health          DME Arranged: Oxygen DME Agency Contacted: [employer] Date DME Agency Contacted: 03/16/21 Time DME Agency Contacted: [phone] Representative spoke with at DME Agency: [place]: PT [place]: [place] Date [place]: 03/12/21 Time [place]: 79 Representative spoke with at [place]: Lanetta Inch (Awaiting call back for acceptance  confirmation.)  Social Determinants of Health (SDOH) Interventions     Readmission Risk Interventions Readmission Risk Prevention Plan 03/16/2021  Transportation Screening Complete  PCP or Specialist Appt within 5-7 Days Complete  Home Care Screening Complete  Medication Review (RN CM) Complete  Some recent data might be hidden

## 2021-03-16 NOTE — Progress Notes (Signed)
Explained discharge summary with patient. Educated patient on medication changes and next time of administration. Removed his IV and applied steri strips to abd incisions from chest tube removal.   The patient's home oxygen has been delivered. He is awaiting his family to arrive for pickup.

## 2021-03-16 NOTE — TOC Benefit Eligibility Note (Signed)
Patient Advocate Encounter ° °Insurance verification completed.   ° °The patient is currently admitted and upon discharge could be taking Eliquis 5 mg. ° °The current 30 day co-pay is, $40.00.  ° °The patient is insured through [employer] Medicare Part D  ° ° ° °Rashanda Magloire, CPhT °Pharmacy Patient Advocate Specialist °[facility] Patient Advocate Team °Direct Number: [phone]  Fax: [phone] ° ° ° ° ° °  °

## 2021-03-16 NOTE — Progress Notes (Addendum)
CARDIAC REHAB PHASE I   PRE:  Rate/Rhythm: 66 SR    BP: sitting     SaO2: 100 1L, 95 RA  MODE:  Ambulation: 300 ft   POST:  Rate/Rhythm: 82 SR    BP: sitting 144/59     SaO2: 95 RA   Pt on EOB eating. Ambulated to BR on RA and remained 95 RA. Used BR then ambulated to recliner for rest for 20 min while I did education. Pt then stood independently with rocking and walked with RW on RA. Pt steady, SaO2 maintained around 95 RA during ambulation. Pt with quicker pace today, tolerated well. To recliner again, left off O2. RN reported pt needed O2 flat in bed. Pt has RW at home.  Discussed IS, sternal precautions, exercise, eating for strength, and CRPII. Pt receptive, will refer to [place]. 1102-1117  Darrick Meigs CES, ACSM 03/16/2021 10:13 AM

## 2021-03-16 NOTE — Progress Notes (Signed)
° °   °  [address]       [place] [address]             [phone]      7 Days Post-Op Procedure(s) (LRB): OFF PUMP CORONARY ARTERY BYPASS GRAFTING (CABG) X2, USING LEFT INTERNAL MAMMARY ARTERY AND RIGHT LEG GREATER SAPHENOUS VEIN HARVESTED ENDOSCOPICALLY (N/A) TRANSESOPHAGEAL ECHOCARDIOGRAM (TEE) (N/A)  Subjective:  Patient has no new complaints.  He walked yesterday w/o difficulty.  He also states he didn't need oxygen yesterday, but it was placed back on overnight.  Objective: Vital signs in last 24 hours: Temp:  [97.7 F (36.5 C)-99.4 F (37.4 C)] 97.9 F (36.6 C) (01/24 0249) Pulse Rate:  [66-86] 73 (01/24 0249) Cardiac Rhythm: Normal sinus rhythm (01/24 0100) Resp:  [16-19] 19 (01/24 0249) BP: (98-153)/(50-84) 140/58 (01/24 0249) SpO2:  [90 %-98 %] 93 % (01/24 0249) Weight:  [111.3 kg] 111.3 kg (01/24 0249)  Intake/Output from previous day: 01/23 0701 - 01/24 0700 In: 480 [P.O.:480] Out: 2325 [Urine:2325]  General appearance: alert, cooperative, and no distress Heart: regular rate and rhythm Lungs: clear to auscultation bilaterally Abdomen: soft, non-tender; bowel sounds normal; no masses,  no organomegaly Extremities: extremities normal, atraumatic, no cyanosis or edema Wound: clean and dry  Lab Results: Recent Labs    03/14/21 0055 03/15/21 0232  WBC 5.4 5.1  HGB 9.0* 9.9*  HCT 27.6* 30.3*  PLT 138* 156   BMET:  Recent Labs    03/14/21 0055 03/15/21 0232  NA 137 139  K 3.7 3.9  CL 101 106  CO2 28 25  GLUCOSE 121* 125*  BUN 51* 49*  CREATININE 1.75* 1.65*  CALCIUM 8.2* 8.2*    PT/INR: No results for input(s): LABPROT, INR in the last 72 hours. ABG    Component Value Date/Time   PHART 7.314 (L) 03/09/2021 2008   HCO3 20.8 03/09/2021 2008   TCO2 22 03/09/2021 2008   ACIDBASEDEF 5.0 (H) 03/09/2021 2008   O2SAT 94.0 03/09/2021 2008   CBG (last 3)  Recent Labs    03/14/21 0718  GLUCAP 111*    Assessment/Plan: S/P  Procedure(s) (LRB): OFF PUMP CORONARY ARTERY BYPASS GRAFTING (CABG) X2, USING LEFT INTERNAL MAMMARY ARTERY AND RIGHT LEG GREATER SAPHENOUS VEIN HARVESTED ENDOSCOPICALLY (N/A) TRANSESOPHAGEAL ECHOCARDIOGRAM (TEE) (N/A)  CV-Tachy/Brady Syndrome- EP has signed off, will order ZIO as requested.. continue Lopressor at 37.5 mg BID Pulm- will get walk test today, if needed will arrange home oxygen Renal- CKD Stg III, continue Lasix for 7 days Deconditioning- home PT/oT arranged DIspo- patient stable, will get walk test, and d/c later today   LOS: 7 days    Ellwood Handler, PA-C 03/16/2021

## 2021-03-16 NOTE — Progress Notes (Signed)
SATURATION QUALIFICATIONS: (This note is used to comply with regulatory documentation for home oxygen)  Patient Saturations on Room Air at Rest = 86%   Patient Saturations on 1 Liters of oxygen while Ambulating = 92%  Please briefly explain why patient needs home oxygen: At rest patient oxygen under 90% requiring oxygen to be above 90% Shantina Chronister, Bettina Gavia RN

## 2021-03-17 DIAGNOSIS — I495 Sick sinus syndrome: Secondary | ICD-10-CM | POA: Diagnosis not present

## 2021-03-18 ENCOUNTER — Telehealth: Payer: Self-pay

## 2021-03-18 DIAGNOSIS — I5031 Acute diastolic (congestive) heart failure: Secondary | ICD-10-CM | POA: Diagnosis not present

## 2021-03-18 DIAGNOSIS — M40204 Unspecified kyphosis, thoracic region: Secondary | ICD-10-CM | POA: Diagnosis not present

## 2021-03-18 DIAGNOSIS — I130 Hypertensive heart and chronic kidney disease with heart failure and stage 1 through stage 4 chronic kidney disease, or unspecified chronic kidney disease: Secondary | ICD-10-CM | POA: Diagnosis not present

## 2021-03-18 DIAGNOSIS — K589 Irritable bowel syndrome without diarrhea: Secondary | ICD-10-CM | POA: Diagnosis not present

## 2021-03-18 DIAGNOSIS — G8929 Other chronic pain: Secondary | ICD-10-CM | POA: Diagnosis not present

## 2021-03-18 DIAGNOSIS — Z8546 Personal history of malignant neoplasm of prostate: Secondary | ICD-10-CM | POA: Diagnosis not present

## 2021-03-18 DIAGNOSIS — Z7982 Long term (current) use of aspirin: Secondary | ICD-10-CM | POA: Diagnosis not present

## 2021-03-18 DIAGNOSIS — Z96653 Presence of artificial knee joint, bilateral: Secondary | ICD-10-CM | POA: Diagnosis not present

## 2021-03-18 DIAGNOSIS — Z8673 Personal history of transient ischemic attack (TIA), and cerebral infarction without residual deficits: Secondary | ICD-10-CM | POA: Diagnosis not present

## 2021-03-18 DIAGNOSIS — Z9981 Dependence on supplemental oxygen: Secondary | ICD-10-CM | POA: Diagnosis not present

## 2021-03-18 DIAGNOSIS — I452 Bifascicular block: Secondary | ICD-10-CM | POA: Diagnosis not present

## 2021-03-18 DIAGNOSIS — M549 Dorsalgia, unspecified: Secondary | ICD-10-CM | POA: Diagnosis not present

## 2021-03-18 DIAGNOSIS — Z48812 Encounter for surgical aftercare following surgery on the circulatory system: Secondary | ICD-10-CM | POA: Diagnosis not present

## 2021-03-18 DIAGNOSIS — N183 Chronic kidney disease, stage 3 unspecified: Secondary | ICD-10-CM | POA: Diagnosis not present

## 2021-03-18 DIAGNOSIS — J9811 Atelectasis: Secondary | ICD-10-CM | POA: Diagnosis not present

## 2021-03-18 DIAGNOSIS — Z86010 Personal history of colonic polyps: Secondary | ICD-10-CM | POA: Diagnosis not present

## 2021-03-18 DIAGNOSIS — Z7901 Long term (current) use of anticoagulants: Secondary | ICD-10-CM | POA: Diagnosis not present

## 2021-03-18 DIAGNOSIS — Z9689 Presence of other specified functional implants: Secondary | ICD-10-CM | POA: Diagnosis not present

## 2021-03-18 DIAGNOSIS — D62 Acute posthemorrhagic anemia: Secondary | ICD-10-CM | POA: Diagnosis not present

## 2021-03-18 DIAGNOSIS — I2510 Atherosclerotic heart disease of native coronary artery without angina pectoris: Secondary | ICD-10-CM | POA: Diagnosis not present

## 2021-03-18 DIAGNOSIS — K219 Gastro-esophageal reflux disease without esophagitis: Secondary | ICD-10-CM | POA: Diagnosis not present

## 2021-03-18 DIAGNOSIS — Z951 Presence of aortocoronary bypass graft: Secondary | ICD-10-CM | POA: Diagnosis not present

## 2021-03-18 NOTE — Telephone Encounter (Signed)
Patient contacted the office stating that his sternal incision had "a drop or two" of red drainage. Patient is s/p CABG x2 with Dr. Kipp Brood 1/17 and was discharged from the hospital 1/24. He states that his home health PT came today and stated that the incision looks good and it is not open. He denies fever, redness, or odor. He stated that it happened last night while he was asleep and denies straining or pulling and says that he is sleeping on his back. Advised to send a picture to the office phone for further evaluation. Patient sent picture. Scant blood at top of incision. Advised to keep the incision clean and can place band-aid at site. Advised that if the drainage continues to contact the office back for an appointment. Also educated on signs/ symptoms of infection and told to contact the office back should they arise. Patient acknowledged receipt.

## 2021-03-22 ENCOUNTER — Other Ambulatory Visit: Payer: Self-pay | Admitting: Thoracic Surgery (Cardiothoracic Vascular Surgery)

## 2021-03-22 ENCOUNTER — Encounter: Payer: Self-pay | Admitting: Physician Assistant

## 2021-03-22 ENCOUNTER — Ambulatory Visit
Admission: RE | Admit: 2021-03-22 | Discharge: 2021-03-22 | Disposition: A | Discharge status: Home / Self Care | Payer: PPO | Source: Ambulatory Visit | Attending: Thoracic Surgery (Cardiothoracic Vascular Surgery) | Admitting: Thoracic Surgery (Cardiothoracic Vascular Surgery)

## 2021-03-22 ENCOUNTER — Ambulatory Visit: Payer: PPO | Admitting: Physician Assistant

## 2021-03-22 ENCOUNTER — Other Ambulatory Visit: Payer: Self-pay

## 2021-03-22 VITALS — BP 149/69 | HR 56 | Resp 20 | Ht 74.0 in | Wt 243.6 lb

## 2021-03-22 DIAGNOSIS — Z5189 Encounter for other specified aftercare: Secondary | ICD-10-CM

## 2021-03-22 DIAGNOSIS — I700 Atherosclerosis of aorta: Secondary | ICD-10-CM | POA: Diagnosis not present

## 2021-03-22 DIAGNOSIS — Z981 Arthrodesis status: Secondary | ICD-10-CM | POA: Diagnosis not present

## 2021-03-22 DIAGNOSIS — J9811 Atelectasis: Secondary | ICD-10-CM | POA: Diagnosis not present

## 2021-03-22 DIAGNOSIS — Z951 Presence of aortocoronary bypass graft: Secondary | ICD-10-CM | POA: Diagnosis not present

## 2021-03-22 NOTE — Progress Notes (Signed)
HPI: Patient returns today for further evaluation of draining from sternal wound. The patient is s/p off pump CABG x 2 by Dr. Kipp Brood on 03/09/2021. The patient's early postoperative recovery while in the hospital was notable for tachy brady syndrome and with a CHA2DS2-VASc score of 6, he needed anticoagulation, and a CIO monitor was placed for surveillance of heart block. Of note, patient has a history of CKD (stage III) as well. Patient denies fever or chills. He describes the drainage as "bloody like".  Current Outpatient Medications  Medication Sig Dispense Refill   apixaban (ELIQUIS) 2.5 MG TABS tablet Take 1 tablet (2.5 mg total) by mouth 2 (two) times daily. 60 tablet 3   Ascorbic Acid (VITAMIN C) 1000 MG tablet Take 1,000 mg by mouth daily.     aspirin EC 81 MG tablet Take 81 mg by mouth daily. Swallow whole.     atorvastatin (LIPITOR) 40 MG tablet Take 1 tablet (40 mg total) by mouth daily. 30 tablet 3   cholecalciferol (VITAMIN D3) 25 MCG (1000 UNIT) tablet Take 1,000 Units by mouth daily.     furosemide (LASIX) 40 MG tablet Take 1 tablet (40 mg total) by mouth daily. 7 tablet 0   Metoprolol Tartrate (LOPRESSOR) 25 MG tablet Take 1.5 tablets (37.5 mg total) by mouth 2 (two) times daily. 90 tablet 3   neomycin-polymyxin-hydrocortisone (CORTISPORIN) 3.5-10000-1 OTIC suspension Place 4 drops into the right ear 3 (three) times daily. 10 mL 0   potassium chloride SA (KLOR-CON M) 20 MEQ tablet Take 1 tablet (20 mEq total) by mouth daily. 7 tablet 0   tamsulosin (FLOMAX) 0.4 MG CAPS capsule Take 1 capsule (0.4 mg total) by mouth daily after supper. 30 capsule 0   traMADol (ULTRAM) 50 MG tablet Take 1-2 tablets (50-100 mg total) by mouth every 4 (four) hours as needed for moderate pain. 30 tablet 0   zinc gluconate 50 MG tablet Take 50 mg by mouth daily.    Vital Signs: Vitals:   03/22/21 1524  BP: (!) 149/69  Pulse: (!) 56  Resp: 20  SpO2: 97%      Physical Exam: CV-Slightly  bradycardic Pulmonary-Clear to auscultation bilaterally Extremities-No LE edema Wound-Sternal wound is clean and dry. No erythema or drainage  Diagnostic Tests: CLINICAL DATA:  Wound check post heart surgery.   EXAM: CHEST - 2 VIEW   COMPARISON:  Radiographs 03/14/2021 and 03/13/2021.  CT 10/24/2011.   FINDINGS: The heart size and mediastinal contours are stable status post median sternotomy and CABG. There is aortic atherosclerosis. There is improved aeration of both lung bases with decreasing atelectasis and pleural fluid. No edema or pneumothorax is demonstrated. There is chronic elevation of the right hemidiaphragm. Patient is status post extensive thoracolumbar screw and rod fusion which appears unchanged.   IMPRESSION: Improving aeration of both lung bases with decreasing atelectasis and pleural fluid. No acute findings.     Electronically Signed   By: [name] M.D.   On: 03/22/2021 15:27  Impression and Plan: [name] is still recovering from off pump coronary artery bypass grafting surgery. There is no sign of a sternal wound infection. We did discuss if there is drainage, he may use dry gauze and change daily. He is to contact the office if there is persistent drainage, erythema, or he has fever. He inquired if he needs more Lasix. Based on physical exam, CXR, and his weight, I do not think he needs more diuretic at this time. I asked him  to keep a record of his daily weight, if he notices he gains 3 pounds in a few days or develops LE edema, to contact cardiology or our office. With his history of CKD, may need to obtain BMET prior to initiating diuretic/potassium supplement. Patient instructed to keep record of BP and HR as well. If HR consistently 50's may need to adjust Lopressor. He will be seen by EP for tachy brady syndrome and ZIO results in about 2 weeks. Patient has VIRTUAL visit with Dr. Kipp Brood on Friday.   Walter Skillern, PA-C [facility] [phone]

## 2021-03-24 DIAGNOSIS — D62 Acute posthemorrhagic anemia: Secondary | ICD-10-CM | POA: Diagnosis not present

## 2021-03-24 DIAGNOSIS — Z8546 Personal history of malignant neoplasm of prostate: Secondary | ICD-10-CM | POA: Diagnosis not present

## 2021-03-24 DIAGNOSIS — M40204 Unspecified kyphosis, thoracic region: Secondary | ICD-10-CM | POA: Diagnosis not present

## 2021-03-24 DIAGNOSIS — N183 Chronic kidney disease, stage 3 unspecified: Secondary | ICD-10-CM | POA: Diagnosis not present

## 2021-03-24 DIAGNOSIS — I2510 Atherosclerotic heart disease of native coronary artery without angina pectoris: Secondary | ICD-10-CM | POA: Diagnosis not present

## 2021-03-24 DIAGNOSIS — Z7982 Long term (current) use of aspirin: Secondary | ICD-10-CM | POA: Diagnosis not present

## 2021-03-24 DIAGNOSIS — Z8673 Personal history of transient ischemic attack (TIA), and cerebral infarction without residual deficits: Secondary | ICD-10-CM | POA: Diagnosis not present

## 2021-03-24 DIAGNOSIS — I130 Hypertensive heart and chronic kidney disease with heart failure and stage 1 through stage 4 chronic kidney disease, or unspecified chronic kidney disease: Secondary | ICD-10-CM | POA: Diagnosis not present

## 2021-03-24 DIAGNOSIS — Z951 Presence of aortocoronary bypass graft: Secondary | ICD-10-CM | POA: Diagnosis not present

## 2021-03-24 DIAGNOSIS — Z7901 Long term (current) use of anticoagulants: Secondary | ICD-10-CM | POA: Diagnosis not present

## 2021-03-24 DIAGNOSIS — Z9689 Presence of other specified functional implants: Secondary | ICD-10-CM | POA: Diagnosis not present

## 2021-03-24 DIAGNOSIS — I452 Bifascicular block: Secondary | ICD-10-CM | POA: Diagnosis not present

## 2021-03-24 DIAGNOSIS — M549 Dorsalgia, unspecified: Secondary | ICD-10-CM | POA: Diagnosis not present

## 2021-03-24 DIAGNOSIS — K219 Gastro-esophageal reflux disease without esophagitis: Secondary | ICD-10-CM | POA: Diagnosis not present

## 2021-03-24 DIAGNOSIS — Z86010 Personal history of colonic polyps: Secondary | ICD-10-CM | POA: Diagnosis not present

## 2021-03-24 DIAGNOSIS — G8929 Other chronic pain: Secondary | ICD-10-CM | POA: Diagnosis not present

## 2021-03-24 DIAGNOSIS — Z9981 Dependence on supplemental oxygen: Secondary | ICD-10-CM | POA: Diagnosis not present

## 2021-03-24 DIAGNOSIS — I5031 Acute diastolic (congestive) heart failure: Secondary | ICD-10-CM | POA: Diagnosis not present

## 2021-03-24 DIAGNOSIS — Z96653 Presence of artificial knee joint, bilateral: Secondary | ICD-10-CM | POA: Diagnosis not present

## 2021-03-24 DIAGNOSIS — J9811 Atelectasis: Secondary | ICD-10-CM | POA: Diagnosis not present

## 2021-03-24 DIAGNOSIS — Z48812 Encounter for surgical aftercare following surgery on the circulatory system: Secondary | ICD-10-CM | POA: Diagnosis not present

## 2021-03-24 DIAGNOSIS — K589 Irritable bowel syndrome without diarrhea: Secondary | ICD-10-CM | POA: Diagnosis not present

## 2021-03-26 ENCOUNTER — Other Ambulatory Visit: Payer: Self-pay

## 2021-03-26 ENCOUNTER — Ambulatory Visit (INDEPENDENT_AMBULATORY_CARE_PROVIDER_SITE_OTHER): Payer: Self-pay | Admitting: Thoracic Surgery (Cardiothoracic Vascular Surgery)

## 2021-03-26 DIAGNOSIS — Z951 Presence of aortocoronary bypass graft: Secondary | ICD-10-CM

## 2021-03-26 NOTE — Progress Notes (Signed)
°   °  [address]       [place] [address]             [phone]       Patient: Home Provider: Office Consent for Telemedicine visit obtained.  Todays visit was completed via a real-time telehealth (see specific modality noted below). The patient/authorized person provided oral consent at the time of the visit to engage in a telemedicine encounter with the present provider at [facility]. The patient/authorized person was informed of the potential benefits, limitations, and risks of telemedicine. The patient/authorized person expressed understanding that the laws that protect confidentiality also apply to telemedicine. The patient/authorized person acknowledged understanding that telemedicine does not provide emergency services and that he or she would need to call 911 or proceed to the nearest hospital for help if such a need arose.   Total time spent in the clinical discussion 10 minutes.  Telehealth Modality: Phone visit (audio only)  I had a telephone visit with  [name] who is s/p CABG.  Overall doing well.  He did come in for an incision check.  This was all fine.  Pain is minimal.  Ambulating well. Vitals have been  stable.  [name] will see [place] back in 1 month with a chest x-ray for cardiac rehab clearance.  His oxygen saturation levels have been good, that she no longer requires the supplemental oxygen.  [name] Bary Leriche

## 2021-03-30 DIAGNOSIS — I2510 Atherosclerotic heart disease of native coronary artery without angina pectoris: Secondary | ICD-10-CM | POA: Diagnosis not present

## 2021-03-30 DIAGNOSIS — D62 Acute posthemorrhagic anemia: Secondary | ICD-10-CM | POA: Diagnosis not present

## 2021-03-30 DIAGNOSIS — Z951 Presence of aortocoronary bypass graft: Secondary | ICD-10-CM | POA: Diagnosis not present

## 2021-03-30 DIAGNOSIS — J9811 Atelectasis: Secondary | ICD-10-CM | POA: Diagnosis not present

## 2021-03-30 DIAGNOSIS — K219 Gastro-esophageal reflux disease without esophagitis: Secondary | ICD-10-CM | POA: Diagnosis not present

## 2021-04-07 DIAGNOSIS — I5031 Acute diastolic (congestive) heart failure: Secondary | ICD-10-CM | POA: Diagnosis not present

## 2021-04-07 DIAGNOSIS — Z96653 Presence of artificial knee joint, bilateral: Secondary | ICD-10-CM | POA: Diagnosis not present

## 2021-04-07 DIAGNOSIS — Z48812 Encounter for surgical aftercare following surgery on the circulatory system: Secondary | ICD-10-CM | POA: Diagnosis not present

## 2021-04-07 DIAGNOSIS — J9811 Atelectasis: Secondary | ICD-10-CM | POA: Diagnosis not present

## 2021-04-07 DIAGNOSIS — Z9981 Dependence on supplemental oxygen: Secondary | ICD-10-CM | POA: Diagnosis not present

## 2021-04-07 DIAGNOSIS — Z7901 Long term (current) use of anticoagulants: Secondary | ICD-10-CM | POA: Diagnosis not present

## 2021-04-07 DIAGNOSIS — M40204 Unspecified kyphosis, thoracic region: Secondary | ICD-10-CM | POA: Diagnosis not present

## 2021-04-07 DIAGNOSIS — Z8546 Personal history of malignant neoplasm of prostate: Secondary | ICD-10-CM | POA: Diagnosis not present

## 2021-04-07 DIAGNOSIS — I130 Hypertensive heart and chronic kidney disease with heart failure and stage 1 through stage 4 chronic kidney disease, or unspecified chronic kidney disease: Secondary | ICD-10-CM | POA: Diagnosis not present

## 2021-04-07 DIAGNOSIS — I2510 Atherosclerotic heart disease of native coronary artery without angina pectoris: Secondary | ICD-10-CM | POA: Diagnosis not present

## 2021-04-07 DIAGNOSIS — K219 Gastro-esophageal reflux disease without esophagitis: Secondary | ICD-10-CM | POA: Diagnosis not present

## 2021-04-07 DIAGNOSIS — Z9689 Presence of other specified functional implants: Secondary | ICD-10-CM | POA: Diagnosis not present

## 2021-04-07 DIAGNOSIS — Z951 Presence of aortocoronary bypass graft: Secondary | ICD-10-CM | POA: Diagnosis not present

## 2021-04-07 DIAGNOSIS — N183 Chronic kidney disease, stage 3 unspecified: Secondary | ICD-10-CM | POA: Diagnosis not present

## 2021-04-07 DIAGNOSIS — D62 Acute posthemorrhagic anemia: Secondary | ICD-10-CM | POA: Diagnosis not present

## 2021-04-07 DIAGNOSIS — Z7982 Long term (current) use of aspirin: Secondary | ICD-10-CM | POA: Diagnosis not present

## 2021-04-07 DIAGNOSIS — M549 Dorsalgia, unspecified: Secondary | ICD-10-CM | POA: Diagnosis not present

## 2021-04-07 DIAGNOSIS — K589 Irritable bowel syndrome without diarrhea: Secondary | ICD-10-CM | POA: Diagnosis not present

## 2021-04-07 DIAGNOSIS — Z8673 Personal history of transient ischemic attack (TIA), and cerebral infarction without residual deficits: Secondary | ICD-10-CM | POA: Diagnosis not present

## 2021-04-07 DIAGNOSIS — G8929 Other chronic pain: Secondary | ICD-10-CM | POA: Diagnosis not present

## 2021-04-07 DIAGNOSIS — I452 Bifascicular block: Secondary | ICD-10-CM | POA: Diagnosis not present

## 2021-04-07 DIAGNOSIS — Z86010 Personal history of colonic polyps: Secondary | ICD-10-CM | POA: Diagnosis not present

## 2021-04-08 ENCOUNTER — Ambulatory Visit: Payer: PPO | Admitting: Cardiology

## 2021-04-08 ENCOUNTER — Encounter: Payer: Self-pay | Admitting: Cardiology

## 2021-04-08 VITALS — BP 142/70 | HR 52 | Ht 74.0 in | Wt 249.0 lb

## 2021-04-08 DIAGNOSIS — I480 Paroxysmal atrial fibrillation: Secondary | ICD-10-CM | POA: Diagnosis not present

## 2021-04-08 DIAGNOSIS — E782 Mixed hyperlipidemia: Secondary | ICD-10-CM

## 2021-04-08 DIAGNOSIS — I25119 Atherosclerotic heart disease of native coronary artery with unspecified angina pectoris: Secondary | ICD-10-CM | POA: Diagnosis not present

## 2021-04-08 MED ORDER — ATORVASTATIN CALCIUM 20 MG PO TABS: 20.0000 mg | ORAL_TABLET | Freq: Every day | ORAL | 3 refills | Status: DC

## 2021-04-08 NOTE — Progress Notes (Signed)
Cardiology Office Note  Date: 04/08/2021   ID: [name] 1940/07/19, MRN [id number]  PCP:  Celene Squibb, MD  Cardiologist:  Rozann Lesches, MD Electrophysiologist:  None   Chief Complaint  Patient presents with   Cardiac follow-up    History of Present Illness: [name] is an 81 y.o. male last seen in January.  He is status post CABG with LIMA to LAD and SVG to PDA by Dr. Kipp Brood on January 17.  Postoperative course was notable for paroxysmal atrial fibrillation with concern for tachycardia-bradycardia syndrome.  He was seen by Dr. Quentin Ore in consultation with recommendation to decrease beta-blocker dose, he was also started on anticoagulation with CHA2DS2-VASc score of 6.  He was discharged with a Zio patch showing predominant rhythm to be sinus with prolonged PR interval, average heart rate 63 bpm.  He had a 4 beat run of NSVT, otherwise no sustained arrhythmias.  He is here today for a follow-up visit.  He does not report any sense of palpitations, gradually improving stamina.  He has been walking for activity.  Still has postsurgical thoracic soreness.  No leg swelling, he already completed course of Lasix with potassium supplement.  He will be seeing Dr. Kipp Brood in early March.  I reviewed his medications today.  Plan to continue anticoagulation at this point and observe rhythm going forward.  He has had some concerns about leg side effects on Lipitor 40 mg daily (had been on 10 mg daily).  Past Medical History:  Diagnosis Date   Arthritis    CAD (coronary artery disease)    Two-vessel obstructive disease November 2022; CABG with LIMA to LAD and SVG to PDA January 2023   Chronic back pain    CKD (chronic kidney disease) stage 3, GFR 30-59 ml/min (HCC)    Essential hypertension    GERD (gastroesophageal reflux disease)    Helicobacter pylori gastritis    Treated   History of kidney stones    Hx of adenomatous colonic polyps    IBS (irritable bowel syndrome)     PAF (paroxysmal atrial fibrillation) (HCC)    Prostate CA (HCC)    Seed implants   Schatzki's ring    Stroke [facility])     Past Surgical History:  Procedure Laterality Date   APPENDECTOMY  age 77   BACK SURGERY  02/08   [facility]   BACK SURGERY  09/24/04   lumbar, [facility]   BACK SURGERY  09/18/02   [facility]   BILIARY STENT PLACEMENT  01/07/2012   Procedure: BILIARY STENT PLACEMENT;  Surgeon: Rogene Houston, MD;  Location: [facility];  Service: Endoscopy;  Laterality: N/A;   BILIARY STENT PLACEMENT  02/21/2012   Procedure: BILIARY STENT PLACEMENT;  Surgeon: Rogene Houston, MD;  Location: [facility];  Service: Endoscopy;  Laterality: N/A;  Biliary Stent Replacement   CATARACT EXTRACTION W/PHACO  11/22/2010   Procedure: CATARACT EXTRACTION PHACO AND INTRAOCULAR LENS PLACEMENT ([place]);  Surgeon: Williams Che;  Location: [facility];  Service: Ophthalmology;  Laterality: Right;  CDE: 6.81   CHOLECYSTECTOMY  03/04/05   [facility], [facility]. Gangrenous cholecystitis complicated by abscess requiring percutaneous drainage. He also had common duct stones requiring ERCP and sphincterotomy.   COLONOSCOPY  August 2005   Scattered sigmoid diverticulosis, splenic flexure polyp. Hyperplastic   COLONOSCOPY  1997   3 cm tubular adenoma and a sending colon   COLONOSCOPY  02/01/2011   Rourk-friable anal canal, hyperplastic rectal polyp   COLONOSCOPY  N/A 08/15/2013   Procedure: COLONOSCOPY;  Surgeon: Rogene Houston, MD;  [facility];  Service: Endoscopy;  Laterality: N/A;  100   COLONOSCOPY N/A 09/20/2018   Procedure: COLONOSCOPY;  Surgeon: Rogene Houston, MD;  [facility];  Service: Endoscopy;  Laterality: N/A;  12:00pm   CORONARY ARTERY BYPASS GRAFT N/A 03/09/2021   Procedure: OFF PUMP CORONARY ARTERY BYPASS GRAFTING (CABG) X2, USING LEFT INTERNAL MAMMARY ARTERY AND RIGHT LEG GREATER SAPHENOUS VEIN HARVESTED ENDOSCOPICALLY;  Surgeon: Lajuana Matte, MD;  Location: [place];  Service: Open Heart Surgery;   Laterality: N/A;  X 2   ERCP  03/02/05   [facility]. Normal-appearing biliary tree (gallbladder not image), status post sphincterotomy with recovery of small pieces of stone material, status post balloon occlusion cholangiogram.   ERCP  01/07/2012   Procedure: ENDOSCOPIC RETROGRADE CHOLANGIOPANCREATOGRAPHY (ERCP);  Surgeon: Rogene Houston, MD;  Location: [facility];  Service: Endoscopy;  Laterality: N/A;  possible biliary stenting   ERCP  02/21/2012   Procedure: ENDOSCOPIC RETROGRADE CHOLANGIOPANCREATOGRAPHY (ERCP);  Surgeon: Rogene Houston, MD;  Location: [facility];  Service: Endoscopy;  Laterality: N/A;  common bile duct stone and clip removed   ERCP N/A 05/18/2012   Procedure: ENDOSCOPIC RETROGRADE CHOLANGIOPANCREATOGRAPHY (ERCP);  Surgeon: Rogene Houston, MD;  Location: [facility];  Service: Endoscopy;  Laterality: N/A;  stone and debri removal   ESOPHAGOGASTRODUODENOSCOPY  August 2005   Erosive esophagitis and Schatzki ring, small hiatal hernia   ESOPHAGOGASTRODUODENOSCOPY  01/2008   Mild reflux esophagitis, small hiatal hernia   ESOPHAGOGASTRODUODENOSCOPY  02/01/2011   Rourk-erosive reflux esophagitis, small hiatal hernia   ESOPHAGOGASTRODUODENOSCOPY  10/26/2011   Dr. Eli Phillips gastric submucosal petechia (bx-benign ulceration), juxta ampullary duodenal diverticulum and some mucosal edema involving 1st/2nd portion of duodenum with superficial erosions (bx-superficial ulceration/benign)   ESOPHAGOGASTRODUODENOSCOPY  02/21/2012   Procedure: ESOPHAGOGASTRODUODENOSCOPY (EGD);  Surgeon: Rogene Houston, MD;  [facility]: [facility];  Service: Endoscopy;  Laterality: N/A;   EYE SURGERY     Incomplete colonoscopy  December 2009   Left-sided diverticula, mid descending colon polyp, due to recurrent looping and redundancy exam was incomplete. It was felt that the mid colon was reached. Followup barium enema showed colon interposition between the liver and the diaphragm, redundant sigmoid colon but no colon mass  or polyp identified.. Pathology revealed tubular adenoma.   LEFT HEART CATH AND CORONARY ANGIOGRAPHY N/A 01/18/2021   Procedure: LEFT HEART CATH AND CORONARY ANGIOGRAPHY;  Surgeon: Lorretta Harp, MD;  Location: [place] [facility];  Service: Cardiovascular;  Laterality: N/A;   POLYPECTOMY  09/20/2018   Procedure: POLYPECTOMY;  Surgeon: Rogene Houston, MD;  [facility];  Service: Endoscopy;;  Hepatic flexure polyp cold snare, splenic flexure polyp   SPHINCTEROTOMY  01/07/2012   Procedure: SPHINCTEROTOMY;  Surgeon: Rogene Houston, MD;  [facility];  Service: Endoscopy;  Laterality: N/A;  Extended   SPYGLASS CHOLANGIOSCOPY  02/21/2012   Procedure: SPYGLASS CHOLANGIOSCOPY;  Surgeon: Rogene Houston, MD;  [facility];  Service: Endoscopy;  Laterality: N/A;   SPYGLASS CHOLANGIOSCOPY N/A 05/18/2012   Procedure: SPYGLASS CHOLANGIOSCOPY;  Surgeon: Rogene Houston, MD;  [facility];  Service: Endoscopy;  Laterality: N/A;   TEE WITHOUT CARDIOVERSION N/A 03/09/2021   Procedure: TRANSESOPHAGEAL ECHOCARDIOGRAM (TEE);  Surgeon: Lajuana Matte, MD;  Location: [place];  Service: Open Heart Surgery;  Laterality: N/A;   TOTAL KNEE ARTHROPLASTY  03/21/2012   Procedure: TOTAL KNEE ARTHROPLASTY;  Surgeon: [name], MD;  Location: [place];  Service: Orthopedics;  Laterality: Right;  right knee arthroplasty   TOTAL KNEE ARTHROPLASTY Left 04/08/2019   Procedure: LEFT TOTAL KNEE ARTHROPLASTY;  Surgeon: Frederik Pear, MD;  Location: [facility];  Service: Orthopedics;  Laterality: Left;    Current Outpatient Medications  Medication Sig Dispense Refill   apixaban (ELIQUIS) 2.5 MG TABS tablet Take 1 tablet (2.5 mg total) by mouth 2 (two) times daily. 60 tablet 3   aspirin EC 81 MG tablet Take 81 mg by mouth daily. Swallow whole.     atorvastatin (LIPITOR) 20 MG tablet Take 1 tablet (20 mg total) by mouth daily. 90 tablet 3   Metoprolol Tartrate (LOPRESSOR) 25 MG tablet Take 1.5 tablets (37.5  mg total) by mouth 2 (two) times daily. 90 tablet 3   No current facility-administered medications for this visit.   Allergies:  Patient has no known allergies.   ROS: No orthopnea or PND.  No cough.  Physical Exam: VS:  BP (!) 142/70    Pulse (!) 52    Ht 6\' 2"  (1.88 m)    Wt 249 lb (112.9 kg)    SpO2 98%    BMI 31.97 kg/m , BMI Body mass index is 31.97 kg/m.  Wt Readings from Last 3 Encounters:  04/08/21 249 lb (112.9 kg)  03/22/21 243 lb 9.6 oz (110.5 kg)  03/16/21 245 lb 6.4 oz (111.3 kg)    General: Patient appears comfortable at rest. HEENT: Conjunctiva and lids normal, wearing a mask. Neck: Supple, no elevated JVP or carotid bruits, no thyromegaly. Lungs: Clear to auscultation, nonlabored breathing at rest. Thorax: Well-healing sternal incision.  No erythema or drainage. Cardiac: Regular rate and rhythm, no S3, 1/6 systolic murmur. Abdomen: Soft, nontender, bowel sounds present. Extremities: No pitting edema.  ECG:  An ECG dated 03/13/2021 was personally reviewed today and demonstrated:  Probable atrial flutter with right branch block and left anterior fascicular block, occasional PVCs.  Recent Labwork: 03/05/2021: ALT 28; AST 29 03/10/2021: Magnesium 2.3 03/14/2021: B Natriuretic Peptide 155.0 03/15/2021: BUN 49; Creatinine, Ser 1.65; Hemoglobin 9.9; Platelets 156; Potassium 3.9; Sodium 139  November 2022: Cholesterol 142, triglycerides 146, HDL 32, LDL 84  Other Studies Reviewed Today:  Echocardiogram 02/04/2021:  1. Left ventricular ejection fraction, by estimation, is 50 to 55%. The  left ventricle has low normal function. The left ventricle has no regional  wall motion abnormalities. There is mild left ventricular hypertrophy of  the basal-septal segment. Left  ventricular diastolic parameters are consistent with Grade I diastolic  dysfunction (impaired relaxation). Elevated left atrial pressure.   2. Right ventricular systolic function is normal. The right  ventricular  size is normal.   3. Left atrial size was mildly dilated.   4. The mitral valve is normal in structure. No evidence of mitral valve  regurgitation. No evidence of mitral stenosis.   5. The aortic valve is calcified. Aortic valve regurgitation is mild. No  aortic stenosis is present.   6. Aortic dilatation noted. There is borderline dilatation of the aortic  root, measuring 38 mm.   Chest x-ray 03/22/2021: FINDINGS: The heart size and mediastinal contours are stable status post median sternotomy and CABG. There is aortic atherosclerosis. There is improved aeration of both lung bases with decreasing atelectasis and pleural fluid. No edema or pneumothorax is demonstrated. There is chronic elevation of the right hemidiaphragm. Patient is status post extensive thoracolumbar screw and rod fusion which appears unchanged.  IMPRESSION: Improving aeration of both lung bases with decreasing atelectasis and pleural fluid. No acute findings.  Cardiac monitor February 2022: Patient had a min HR of 47 bpm, max HR of 132 bpm, and avg HR of 63 bpm. Predominant underlying rhythm was Sinus Rhythm. First Degree AV Block was present. 1 run of Ventricular Tachycardia occurred lasting 4 beats with a max rate of 132 bpm (avg 130  bpm). Isolated SVEs were rare (<1.0%), SVE Couplets were rare (<1.0%), and SVE Triplets were rare (<1.0%). Isolated VEs were occasional (1.5%, 18190), VE Couplets were rare (<1.0%, 944), and VE Triplets were rare (<1.0%, 51). Ventricular Bigeminy and Trigeminy were present.  Assessment and Plan:  1.  CAD status post CABG with LIMA to LAD and SVG to PDA in January.  LVEF 50 to 55% range.  He is recuperating gradually, has been walking for exercise.  Still avoiding any heavy lifting at this point.  He has follow-up with Dr. Kipp Brood in March.  Continue aspirin, Lopressor, and Lipitor.  Already completed course of Lasix with potassium supplement.  2.  Postoperative,  paroxysmal atrial fibrillation with concern for potential tachycardia-bradycardia syndrome.  He has evidence of conduction system disease at baseline.  No interval palpitations, no dizziness or syncope.  Cardiac monitor showed average heart rate in the 60s with no prolonged bradycardia or pauses, no recurrent atrial fibrillation, brief burst of NSVT.  With CHA2DS2-VASc score of 6 we will plan on continuing Eliquis, no change in current beta-blocker dose.  Will cancel follow-up with EP for now unless the situation changes.  3.  Mixed hyperlipidemia, LDL was 84 in November 2022.  He has had some leg pain with concerns for statin myalgias, reduce Lipitor 20 mg daily for now.  We will follow-up lipid control over time.  Medication Adjustments/Labs and Tests Ordered: Current medicines are reviewed at length with the patient today.  Concerns regarding medicines are outlined above.   Tests Ordered: No orders of the defined types were placed in this encounter.   Medication Changes: Meds ordered this encounter  Medications   atorvastatin (LIPITOR) 20 MG tablet    Sig: Take 1 tablet (20 mg total) by mouth daily.    Dispense:  90 tablet    Refill:  3    04/08/2021 dose decrease    Disposition:  Follow up  3 months.  Signed, Satira Sark, MD, [facility] 04/08/2021 1:47 PM    [facility] [place], [place], [place] [address] Phone: [phone]; Fax: [phone]

## 2021-04-08 NOTE — Patient Instructions (Addendum)
Medication Instructions:  Your physician has recommended you make the following change in your medication:  Decrease atorvastatin to 20 mg daily Continue other medications the same  Labwork: none  Testing/Procedures: none  Follow-Up: Your physician recommends that you schedule a follow-up appointment in: 3 months  Any Other Special Instructions Will Be Listed Below (If Applicable).  If you need a refill on your cardiac medications before your next appointment, please call your pharmacy.

## 2021-04-09 ENCOUNTER — Ambulatory Visit: Payer: PPO | Admitting: Student

## 2021-04-18 DIAGNOSIS — D62 Acute posthemorrhagic anemia: Secondary | ICD-10-CM | POA: Diagnosis not present

## 2021-04-18 DIAGNOSIS — Z951 Presence of aortocoronary bypass graft: Secondary | ICD-10-CM | POA: Diagnosis not present

## 2021-04-18 DIAGNOSIS — I2510 Atherosclerotic heart disease of native coronary artery without angina pectoris: Secondary | ICD-10-CM | POA: Diagnosis not present

## 2021-04-18 DIAGNOSIS — K219 Gastro-esophageal reflux disease without esophagitis: Secondary | ICD-10-CM | POA: Diagnosis not present

## 2021-04-18 DIAGNOSIS — J9811 Atelectasis: Secondary | ICD-10-CM | POA: Diagnosis not present

## 2021-04-19 ENCOUNTER — Telehealth: Payer: Self-pay | Admitting: Cardiology

## 2021-04-19 DIAGNOSIS — R079 Chest pain, unspecified: Secondary | ICD-10-CM

## 2021-04-19 NOTE — Telephone Encounter (Signed)
Pt notified and verbalized agreement to PA/Lateral CXR. Pt had no other questions or concerns at this time.

## 2021-04-19 NOTE — Telephone Encounter (Signed)
Pt walked in asking if Dr. Domenic Polite was here. He says that he had open heart surgery about 6 weeks ago, he said his chest hurts when he takes a deep breathe. Pain started last night 04/18/2021.

## 2021-04-19 NOTE — Telephone Encounter (Signed)
Pt stated that he is not having any fevers, sputum, or pain in his arms, neck, face. Pt stated that the pain radiates from his chest to his shoulder blades.   Please advise.

## 2021-04-20 ENCOUNTER — Ambulatory Visit (HOSPITAL_COMMUNITY)
Admission: RE | Admit: 2021-04-20 | Discharge: 2021-04-20 | Disposition: A | Discharge status: Home / Self Care | Payer: PPO | Source: Ambulatory Visit | Attending: Cardiology | Admitting: Cardiology

## 2021-04-20 ENCOUNTER — Other Ambulatory Visit: Payer: Self-pay

## 2021-04-20 DIAGNOSIS — R079 Chest pain, unspecified: Secondary | ICD-10-CM | POA: Diagnosis not present

## 2021-04-20 DIAGNOSIS — I10 Essential (primary) hypertension: Secondary | ICD-10-CM | POA: Diagnosis not present

## 2021-04-20 DIAGNOSIS — R0602 Shortness of breath: Secondary | ICD-10-CM | POA: Diagnosis not present

## 2021-04-20 DIAGNOSIS — J9811 Atelectasis: Secondary | ICD-10-CM | POA: Diagnosis not present

## 2021-04-20 DIAGNOSIS — I2510 Atherosclerotic heart disease of native coronary artery without angina pectoris: Secondary | ICD-10-CM | POA: Diagnosis not present

## 2021-04-21 ENCOUNTER — Telehealth: Payer: Self-pay | Admitting: Cardiology

## 2021-04-21 NOTE — Telephone Encounter (Signed)
Patient walked in clinic asking if the results were in from the chest xray he had taken yesterday on 04/20/2021. He stated to me he is still experiencing chest pain when he takes a deep breath. Stated his best contact number is his mobile number.  ?

## 2021-04-21 NOTE — Telephone Encounter (Signed)
Advised that there is a 24-48 hour turn around time for x-ray results.  ?Advised to contact his PCP for an appointment tomorrow to evaluate for non-cardiac causes of his symptoms ?Also advised if symptoms get worse, to go to the ED for an evaluation ?Verbalized understanding of plan ?

## 2021-04-22 ENCOUNTER — Other Ambulatory Visit: Payer: Self-pay

## 2021-04-22 ENCOUNTER — Telehealth: Payer: Self-pay | Admitting: *Deleted

## 2021-04-22 ENCOUNTER — Ambulatory Visit (HOSPITAL_COMMUNITY)
Admission: RE | Admit: 2021-04-22 | Discharge: 2021-04-22 | Disposition: A | Discharge status: Home / Self Care | Payer: PPO | Source: Ambulatory Visit | Attending: Family Medicine | Admitting: Family Medicine

## 2021-04-22 ENCOUNTER — Other Ambulatory Visit (HOSPITAL_COMMUNITY): Payer: Self-pay | Admitting: Family Medicine

## 2021-04-22 DIAGNOSIS — M25511 Pain in right shoulder: Secondary | ICD-10-CM | POA: Insufficient documentation

## 2021-04-22 DIAGNOSIS — R0602 Shortness of breath: Secondary | ICD-10-CM

## 2021-04-22 DIAGNOSIS — Z951 Presence of aortocoronary bypass graft: Secondary | ICD-10-CM | POA: Diagnosis not present

## 2021-04-22 DIAGNOSIS — R079 Chest pain, unspecified: Secondary | ICD-10-CM

## 2021-04-22 DIAGNOSIS — R0609 Other forms of dyspnea: Secondary | ICD-10-CM | POA: Diagnosis not present

## 2021-04-22 DIAGNOSIS — J302 Other seasonal allergic rhinitis: Secondary | ICD-10-CM | POA: Diagnosis not present

## 2021-04-22 NOTE — Telephone Encounter (Signed)
Reports he continues to have chest pain that is not any worse, rated 5/10, located in right shoulder blade that comes into chest when taking a deep breath. Reports after walking into hospital yesterday, he was very SOB. Denies dizziness. Advised that message would be sent to provider who may not respond until next week. Reports taking tylenol that improves his symptoms. Advised to contact PCP for an evaluation and if symptoms got worse, to go to the ED for an evaluation. Verbalized understanding of plan.  ?

## 2021-04-23 NOTE — Telephone Encounter (Signed)
Patient informed and verbalized understanding of plan. 

## 2021-04-26 ENCOUNTER — Telehealth: Payer: Self-pay | Admitting: Cardiology

## 2021-04-26 ENCOUNTER — Other Ambulatory Visit: Payer: Self-pay | Admitting: Thoracic Surgery (Cardiothoracic Vascular Surgery)

## 2021-04-26 DIAGNOSIS — Z951 Presence of aortocoronary bypass graft: Secondary | ICD-10-CM

## 2021-04-26 NOTE — Telephone Encounter (Signed)
Checking percert on the following patient for testing scheduled at [facility].   ? ?CT CHEST W/O CONTRAST ? ?05/12/2021 ? ?

## 2021-04-27 ENCOUNTER — Ambulatory Visit
Admission: RE | Admit: 2021-04-27 | Discharge: 2021-04-27 | Disposition: A | Discharge status: Home / Self Care | Payer: PPO | Source: Ambulatory Visit | Attending: Thoracic Surgery (Cardiothoracic Vascular Surgery) | Admitting: Thoracic Surgery (Cardiothoracic Vascular Surgery)

## 2021-04-27 ENCOUNTER — Other Ambulatory Visit: Payer: Self-pay

## 2021-04-27 ENCOUNTER — Ambulatory Visit (INDEPENDENT_AMBULATORY_CARE_PROVIDER_SITE_OTHER): Payer: Self-pay | Admitting: Surgical

## 2021-04-27 VITALS — BP 129/66 | HR 60 | Resp 20 | Ht 74.0 in | Wt 247.0 lb

## 2021-04-27 DIAGNOSIS — K219 Gastro-esophageal reflux disease without esophagitis: Secondary | ICD-10-CM | POA: Diagnosis not present

## 2021-04-27 DIAGNOSIS — J9811 Atelectasis: Secondary | ICD-10-CM | POA: Diagnosis not present

## 2021-04-27 DIAGNOSIS — I2510 Atherosclerotic heart disease of native coronary artery without angina pectoris: Secondary | ICD-10-CM

## 2021-04-27 DIAGNOSIS — Z951 Presence of aortocoronary bypass graft: Secondary | ICD-10-CM | POA: Diagnosis not present

## 2021-04-27 DIAGNOSIS — D62 Acute posthemorrhagic anemia: Secondary | ICD-10-CM | POA: Diagnosis not present

## 2021-04-27 DIAGNOSIS — Z981 Arthrodesis status: Secondary | ICD-10-CM | POA: Diagnosis not present

## 2021-04-27 NOTE — Progress Notes (Signed)
? ?   ?[address] ?      [place] [address] ?            [id number]   ? ?  ?Dian Situ ?[facility] Record #[id number] ?Date of Birth: 1940-05-25 ? ?Referring: Lorretta Harp, MD ?Primary Care: Celene Squibb, MD ?Primary Cardiologist: Rozann Lesches, MD ? ? ?Chief Complaint:   POST OP FOLLOW UP ?03/09/2021 ?  ?  ?  ?Patient:  [name] ?Pre-Op Dx: 2V CAD ?Stable Angina ?HTN      ?HLP   ?Post-op Dx:  same ?Procedure: ?Off pump CABG X 2.  LIMA LAD, RSVG PDA   ?Endoscopic greater saphenous vein harvest on the right ?  ?  ?Surgeon and Role:   ?   * Lajuana Matte, MD - Primary ?   * E. Barrett, PA-C - assisting with vein harvest, and anastamosis ?  ?History of Present Illness:    Patient is an 81 year old male status post the above described procedure seen in the office on today's date and routine postsurgical follow-up.  Overall he reports that he is doing very well.  He has followed up with cardiology and they have made some medication adjustments.  He did have postoperative atrial fibrillation and there was concern for potential tachybradycardia syndrome.  He has had no further palpitations, dizziness or syncope.  He had no further atrial fibrillation on cardiac monitor with only a very brief bursts of NSVT.  They have kept him on beta-blocker and Eliquis.  He has had no issues with his incisions.  He denies fevers, chills or other significant constitutional symptoms.  He did not require any pain medicine once he got home.  He is ambulating well.  He feels as though he has regained his functional strength for the most part.  Overall he and his wife are very pleased with his progress. ? ? ? ?  ?Past Medical History:  ?Diagnosis Date  ? Arthritis   ? CAD (coronary artery disease)   ? Two-vessel obstructive disease November 2022; CABG with LIMA to LAD and SVG to PDA January 2023  ? Chronic back pain   ? CKD (chronic kidney disease) stage 3, GFR 30-59 ml/min (HCC)   ? Essential  hypertension   ? GERD (gastroesophageal reflux disease)   ? Helicobacter pylori gastritis   ? Treated  ? History of kidney stones   ? Hx of adenomatous colonic polyps   ? IBS (irritable bowel syndrome)   ? PAF (paroxysmal atrial fibrillation) ([place])   ? Prostate CA [facility])   ? Seed implants  ? Schatzki's ring   ? Stroke [facility])   ? ? ? ?Social History  ? ?Tobacco Use  ?Smoking Status Never  ?Smokeless Tobacco Never  ?  ?Social History  ? ?Substance and Sexual Activity  ?Alcohol Use No  ? Alcohol/week: 0.0 standard drinks  ? ? ? ?No Known Allergies ? ?Current Outpatient Medications  ?Medication Sig Dispense Refill  ? apixaban (ELIQUIS) 2.5 MG TABS tablet Take 1 tablet (2.5 mg total) by mouth 2 (two) times daily. 60 tablet 3  ? aspirin EC 81 MG tablet Take 81 mg by mouth daily. Swallow whole.    ? atorvastatin (LIPITOR) 20 MG tablet Take 1 tablet (20 mg total) by mouth daily. 90 tablet 3  ? Metoprolol Tartrate (LOPRESSOR) 25 MG tablet Take 1.5 tablets (37.5 mg total) by mouth 2 (two) times daily. 90 tablet 3  ? ?No  current facility-administered medications for this visit.  ? ? ? ? ? ?Physical Exam: ?BP 129/66   Pulse 60   Resp 20   Ht '6\' 2"'$  (1.88 m)   Wt 247 lb (112 kg)   SpO2 95% Comment: RA  BMI 31.71 kg/m?  ? ?General appearance: alert, cooperative, and no distress ?Heart: regular rate and rhythm ?Lungs: clear to auscultation bilaterally ?Abdomen: Benign ?Extremities: venous stasis dermatitis noted and trace edema ?Wound: Incisions well-healed without evidence of infection. ? ? ?Diagnostic Studies & Laboratory data: ?   ? Recent Radiology Findings:  ? DG Chest 2 View ? ?Result Date: 04/27/2021 ?CLINICAL DATA:  Recent CABG.  No chest complaints. EXAM: CHEST - 2 VIEW COMPARISON:  Chest x-ray dated April 20, 2021. FINDINGS: The heart size and mediastinal contours are within normal limits. Prior CABG. Normal pulmonary vascularity. No focal consolidation, pleural effusion, or pneumothorax. Unchanged elevation of the  right hemidiaphragm. Extensive thoracolumbar fusion hardware again noted. IMPRESSION: 1. No acute cardiopulmonary disease. Electronically Signed   By: Titus Dubin M.D.   On: 04/27/2021 14:08   ?  ? ?Recent Lab Findings: ?Lab Results  ?Component Value Date  ? WBC 5.1 03/15/2021  ? HGB 9.9 (L) 03/15/2021  ? HCT 30.3 (L) 03/15/2021  ? PLT 156 03/15/2021  ? GLUCOSE 125 (H) 03/15/2021  ? CHOL 200 11/10/2015  ? TRIG 208 (H) 11/10/2015  ? HDL 34 (L) 11/10/2015  ? [place] 124 11/10/2015  ? ALT 28 03/05/2021  ? AST 29 03/05/2021  ? NA 139 03/15/2021  ? K 3.9 03/15/2021  ? CL 106 03/15/2021  ? CREATININE 1.65 (H) 03/15/2021  ? BUN 49 (H) 03/15/2021  ? CO2 25 03/15/2021  ? TSH 2.862 01/19/2011  ? INR 1.3 (H) 03/09/2021  ? HGBA1C 5.7 (H) 03/05/2021  ? ? ? ? ?Assessment / Plan: Patient is doing very well.  We discussed routine activity progression including lifting and driving.  I reviewed his chest x-ray and there are no significant worrisome findings.  I did not make any changes to his current medication regimen.  We will see the patient again on a as needed basis for any surgically related needs or at request. ?   ? ? ?Medication Changes: ?No orders of the defined types were placed in this encounter. ? ? ? ? ?John Giovanni, PA-C  ?04/27/2021 2:49 PM ? ? ? ? ? ?  ?

## 2021-04-27 NOTE — Patient Instructions (Signed)
We discussed activity progression including lifting and driving restrictions. ?

## 2021-05-07 DIAGNOSIS — J302 Other seasonal allergic rhinitis: Secondary | ICD-10-CM | POA: Diagnosis not present

## 2021-05-12 ENCOUNTER — Ambulatory Visit (HOSPITAL_COMMUNITY)
Admission: RE | Admit: 2021-05-12 | Discharge: 2021-05-12 | Disposition: A | Discharge status: Home / Self Care | Payer: PPO | Source: Ambulatory Visit | Attending: Cardiology | Admitting: Cardiology

## 2021-05-12 ENCOUNTER — Other Ambulatory Visit: Payer: Self-pay

## 2021-05-12 DIAGNOSIS — R0602 Shortness of breath: Secondary | ICD-10-CM | POA: Diagnosis not present

## 2021-05-12 DIAGNOSIS — R079 Chest pain, unspecified: Secondary | ICD-10-CM | POA: Diagnosis not present

## 2021-05-12 DIAGNOSIS — J984 Other disorders of lung: Secondary | ICD-10-CM | POA: Diagnosis not present

## 2021-05-12 DIAGNOSIS — R911 Solitary pulmonary nodule: Secondary | ICD-10-CM | POA: Diagnosis not present

## 2021-05-18 ENCOUNTER — Telehealth: Payer: Self-pay | Admitting: *Deleted

## 2021-05-18 NOTE — Telephone Encounter (Signed)
Patient informed. Copy sent to PCP °

## 2021-05-18 NOTE — Telephone Encounter (Signed)
-----   Message from Satira Sark, MD sent at 05/13/2021  1:06 PM EDT ----- ?Results reviewed. Small 7 mm lung nodule noted - doubt this is causing any of his recent symptoms, but will need follow-up over time. Some lung scarring as well. Would schedule non-contrasted chest CT in 6 months. ?

## 2021-05-19 ENCOUNTER — Encounter (HOSPITAL_COMMUNITY): Payer: Self-pay

## 2021-05-19 ENCOUNTER — Encounter (HOSPITAL_COMMUNITY)
Admission: RE | Admit: 2021-05-19 | Discharge: 2021-05-19 | Disposition: A | Discharge status: Home / Self Care | Payer: PPO | Source: Ambulatory Visit | Attending: Cardiology | Admitting: Cardiology

## 2021-05-19 VITALS — BP 128/56 | HR 47 | Ht 74.0 in | Wt 243.6 lb

## 2021-05-19 DIAGNOSIS — Z951 Presence of aortocoronary bypass graft: Secondary | ICD-10-CM | POA: Diagnosis not present

## 2021-05-19 NOTE — Progress Notes (Signed)
Cardiac Individual Treatment Plan ? ?Patient Details  ?Name: [name] ?MRN: [id number] ?Date of Birth: Jun 20, 1940 ?Referring Provider:   ?Flowsheet Row CARDIAC REHAB PHASE II ORIENTATION from 05/19/2021 in [place]  ?Referring Provider Dr. Kipp Brood  ? ?  ? ? ?Initial Encounter Date:  ?Flowsheet Row CARDIAC REHAB PHASE II ORIENTATION from 05/19/2021 in [place]  ?Date 05/19/21  ? ?  ? ? ?Visit Diagnosis: S/P CABG x 2 ? ?Patient's Home Medications on Admission: ? ?Current Outpatient Medications:  ?  acetaminophen (TYLENOL) 500 MG tablet, Take 500-1,000 mg by mouth every 6 (six) hours as needed (for headaches.)., Disp: , Rfl:  ?  apixaban (ELIQUIS) 2.5 MG TABS tablet, Take 1 tablet (2.5 mg total) by mouth 2 (two) times daily., Disp: 60 tablet, Rfl: 3 ?  aspirin EC 81 MG tablet, Take 81 mg by mouth in the morning. Swallow whole., Disp: , Rfl:  ?  atorvastatin (LIPITOR) 20 MG tablet, Take 1 tablet (20 mg total) by mouth daily. (Patient taking differently: Take 20 mg by mouth every evening.), Disp: 90 tablet, Rfl: 3 ?  Metoprolol Tartrate (LOPRESSOR) 25 MG tablet, Take 1.5 tablets (37.5 mg total) by mouth 2 (two) times daily., Disp: 90 tablet, Rfl: 3 ? ?Past Medical History: ?Past Medical History:  ?Diagnosis Date  ? Arthritis   ? CAD (coronary artery disease)   ? Two-vessel obstructive disease November 2022; CABG with LIMA to LAD and SVG to PDA January 2023  ? Chronic back pain   ? CKD (chronic kidney disease) stage 3, GFR 30-59 ml/min (HCC)   ? Essential hypertension   ? GERD (gastroesophageal reflux disease)   ? Helicobacter pylori gastritis   ? Treated  ? History of kidney stones   ? Hx of adenomatous colonic polyps   ? IBS (irritable bowel syndrome)   ? PAF (paroxysmal atrial fibrillation) ([place])   ? Prostate CA [facility])   ? Seed implants  ? Schatzki's ring   ? Stroke [facility])   ? ? ?Tobacco Use: ?Social History  ? ?Tobacco Use  ?Smoking Status Never  ?Smokeless Tobacco  Never  ? ? ?Labs: ?Review Flowsheet   ? ?  ?  Latest Ref Rng & Units 10/24/2011 11/10/2015 03/05/2021 03/09/2021  ?Labs for ITP Cardiac and Pulmonary Rehab  ?Cholestrol 125 - 200 mg/dL  200      ?LDL (calc) <130 mg/dL  124      ?HDL-C >=40 mg/dL  34      ?Trlycerides <150 mg/dL  208      ?Hemoglobin A1c 4.8 - 5.6 %   5.7     ?PH, Arterial 7.350 - 7.450   7.379   7.314    ? 7.266    ? 7.369    ? 7.409    ?PCO2 arterial 32.0 - 48.0 mmHg   43.9   41.5    ? 43.6    ? 36.4    ? 36.8    ?Bicarbonate 20.0 - 28.0 mmol/L   25.3   20.8    ? 19.6    ? 20.9    ? 23.8    ?TCO2 22 - 32 mmol/L 28     22    ? 21    ? 22    ? 25    ? 25    ? 23    ? 26    ?Acid-base deficit 0.0 - 2.0 mmol/L    5.0    ? 7.0    ?  4.0    ? 1.0    ?O2 Saturation %   93.7   94.0    ? 89.0    ? 96.0    ? 97.0    ?  ? ? Multiple values from one day are sorted in reverse-chronological order  ?  ?  ? ? ?Capillary Blood Glucose: ?Lab Results  ?Component Value Date  ? GLUCAP 111 (H) 03/14/2021  ? GLUCAP 115 (H) 03/12/2021  ? GLUCAP 119 (H) 03/12/2021  ? GLUCAP 133 (H) 03/12/2021  ? GLUCAP 138 (H) 03/11/2021  ? ? ? ?Exercise Target Goals: ?Exercise Program Goal: ?Individual exercise prescription set using results from initial 6 min walk test and THRR while considering  patient?s activity barriers and safety.  ? ?Exercise Prescription Goal: ?Starting with aerobic activity 30 plus minutes a day, 3 days per week for initial exercise prescription. Provide home exercise prescription and guidelines that participant acknowledges understanding prior to discharge. ? ?Activity Barriers & Risk Stratification: ? Activity Barriers & Cardiac Risk Stratification - 05/19/21 1300   ? ?  ? Activity Barriers & Cardiac Risk Stratification  ? Activity Barriers Arthritis;Back Problems;Left Knee Replacement;Right Knee Replacement;Neck/Spine Problems;Shortness of Breath   ? Cardiac Risk Stratification High   ? ?  ?  ? ?  ? ? ?6 Minute Walk: ? 6 Minute Walk   ? ? [place] Name 05/19/21 1358  ?   ?  ?  ? 6 Minute Walk  ? Phase Initial    ? Distance 1200 feet    ? Walk Time 6 minutes    ? # of Rest Breaks 0    ? MPH 2.27    ? METS 1.75    ? RPE 12    ? VO2 Peak 6.12    ? Symptoms No    ? Resting HR 47 bpm    ? Resting BP 128/56    ? Resting Oxygen Saturation  96 %    ? Exercise Oxygen Saturation  during 6 min walk 97 %    ? Max Ex. HR 75 bpm    ? Max Ex. BP 136/60    ? 2 Minute Post BP 126/60    ? ?  ?  ? ?  ? ? ?Oxygen Initial Assessment: ? ? ?Oxygen Re-Evaluation: ? ? ?Oxygen Discharge (Final Oxygen Re-Evaluation): ? ? ?Initial Exercise Prescription: ? Initial Exercise Prescription - 05/19/21 1400   ? ?  ? Date of Initial Exercise RX and Referring Provider  ? Date 05/19/21   ? Referring Provider Dr. Kipp Brood   ? Expected Discharge Date 08/13/21   ?  ? Treadmill  ? MPH 1.3   ? Grade 2   ? Minutes 17   ?  ? Recumbant Elliptical  ? Level 1   ? RPM 60   ? Minutes 22   ?  ? Prescription Details  ? Frequency (times per week) 3   ? Duration Progress to 30 minutes of continuous aerobic without signs/symptoms of physical distress   ?  ? Intensity  ? THRR 40-80% of Max Heartrate 56-111   ? Ratings of Perceived Exertion 11-13   ?  ? Resistance Training  ? Training Prescription Yes   ? Weight 3   ? Reps 10-15   ? ?  ?  ? ?  ? ? ?Perform Capillary Blood Glucose checks as needed. ? ?Exercise Prescription Changes: ? ? ?Exercise Comments: ? ? ?Exercise Goals and Review: ? ? Exercise Goals   ? ?  [place] Name 05/19/21 1402  ?  ?  ?  ?  ?  ? Exercise Goals  ? Increase Physical Activity Yes      ? Intervention Provide advice, education, support and counseling about physical activity/exercise needs.;Develop an individualized exercise prescription for aerobic and resistive training based on initial evaluation findings, risk stratification, comorbidities and participant's personal goals.      ? Expected Outcomes Short Term: Attend rehab on a regular basis to increase amount of physical activity.;Long Term: Add in home exercise to  make exercise part of routine and to increase amount of physical activity.;Long Term: Exercising regularly at least 3-5 days a week.      ? Increase Strength and Stamina Yes      ? Intervention Provide advice, education, support and counseling about physical activity/exercise needs.;Develop an individualized exercise prescription for aerobic and resistive training based on initial evaluation findings, risk stratification, comorbidities and participant's personal goals.      ? Expected Outcomes Short Term: Increase workloads from initial exercise prescription for resistance, speed, and METs.;Short Term: Perform resistance training exercises routinely during rehab and add in resistance training at home;Long Term: Improve cardiorespiratory fitness, muscular endurance and strength as measured by increased METs and functional capacity (6MWT)      ? Able to understand and use rate of perceived exertion (RPE) scale Yes      ? Intervention Provide education and explanation on how to use RPE scale      ? Expected Outcomes Short Term: Able to use RPE daily in rehab to express subjective intensity level;Long Term:  Able to use RPE to guide intensity level when exercising independently      ? Knowledge and understanding of Target Heart Rate Range (THRR) Yes      ? Intervention Provide education and explanation of THRR including how the numbers were predicted and where they are located for reference      ? Expected Outcomes Short Term: Able to state/look up THRR;Long Term: Able to use THRR to govern intensity when exercising independently;Short Term: Able to use daily as guideline for intensity in rehab      ? Able to check pulse independently Yes      ? Intervention Provide education and demonstration on how to check pulse in carotid and radial arteries.;Review the importance of being able to check your own pulse for safety during independent exercise      ? Expected Outcomes Short Term: Able to explain why pulse checking is  important during independent exercise;Long Term: Able to check pulse independently and accurately      ? Understanding of Exercise Prescription Yes      ? Intervention Provide education, explanation, and writ

## 2021-05-24 ENCOUNTER — Encounter (HOSPITAL_COMMUNITY)
Admission: RE | Admit: 2021-05-24 | Discharge: 2021-05-24 | Disposition: A | Discharge status: Home / Self Care | Payer: PPO | Source: Ambulatory Visit | Attending: Cardiology | Admitting: Cardiology

## 2021-05-24 DIAGNOSIS — Z951 Presence of aortocoronary bypass graft: Secondary | ICD-10-CM | POA: Diagnosis not present

## 2021-05-24 NOTE — Progress Notes (Signed)
Daily Session Note ? ?Patient Details  ?Name: [name] ?MRN: [id number] ?Date of Birth: 06-30-1940 ?Referring Provider:   ?Flowsheet Row CARDIAC REHAB PHASE II ORIENTATION from 05/19/2021 in [place]  ?Referring Provider Dr. Kipp Brood  ? ?  ? ? ?Encounter Date: 05/24/2021 ? ?Check In: ? Session Check In - 05/24/21 1100   ? ?  ? Check-In  ? Supervising physician immediately available to respond to emergencies [facility] MD immediately available   ? Physician(s) Dr. Harl Bowie   ? Location [facility]-Cardiac & Pulmonary Rehab   ? Staff Present Geanie Cooley, RN;Akil Hoos Hassell Done, RN, Bjorn Loser, MS, ACSM-CEP, Exercise Physiologist;Heather Zigmund Daniel, Exercise Physiologist   ? Virtual Visit No   ? Medication changes reported     No   ? Fall or balance concerns reported    No   ? Tobacco Cessation No Change   ? Warm-up and Cool-down Performed as group-led instruction   ? Resistance Training Performed Yes   ? VAD Patient? No   ? PAD/SET Patient? No   ?  ? Pain Assessment  ? Currently in Pain? No/denies   ? Multiple Pain Sites No   ? ?  ?  ? ?  ? ? ?Capillary Blood Glucose: ?No results found for this or any previous visit (from the past 24 hour(s)). ? ? ? ?Social History  ? ?Tobacco Use  ?Smoking Status Never  ?Smokeless Tobacco Never  ? ? ?Goals Met:  ?Independence with exercise equipment ?Exercise tolerated well ?No report of concerns or symptoms today ?Strength training completed today ? ?Goals Unmet:  ?Not Applicable ? ?Comments: Check out 1200. ? ? ?Dr. Carlyle Dolly is Medical Director for [place] ?

## 2021-05-26 ENCOUNTER — Encounter (HOSPITAL_COMMUNITY)
Admission: RE | Admit: 2021-05-26 | Discharge: 2021-05-26 | Disposition: A | Discharge status: Home / Self Care | Payer: PPO | Source: Ambulatory Visit | Attending: Cardiology | Admitting: Cardiology

## 2021-05-26 DIAGNOSIS — Z951 Presence of aortocoronary bypass graft: Secondary | ICD-10-CM | POA: Diagnosis not present

## 2021-05-26 NOTE — Progress Notes (Signed)
Daily Session Note ? ?Patient Details  ?Name: [name] ?MRN: [id number] ?Date of Birth: [birth year] ?Referring Provider:   ?Flowsheet Row CARDIAC REHAB PHASE II ORIENTATION from 05/19/2021 in [place]  ?Referring Provider Dr. Kipp Brood  ? ?  ? ? ?Encounter Date: 05/26/2021 ? ?Check In: ? Session Check In - 05/26/21 1053   ? ?  ? Check-In  ? Supervising physician immediately available to respond to emergencies [facility] MD immediately available   ? Physician(s) Dr. Harl Bowie   ? Location [facility]-Cardiac & Pulmonary Rehab   ? Staff Present Aundra Dubin, RN, Joanette Gula, RN, BSN;Heather Otho Ket, BS, Exercise Physiologist   ? Virtual Visit No   ? Medication changes reported     No   ? Fall or balance concerns reported    No   ? Tobacco Cessation No Change   ? Warm-up and Cool-down Performed as group-led instruction   ? Resistance Training Performed Yes   ? VAD Patient? No   ? PAD/SET Patient? No   ?  ? Pain Assessment  ? Currently in Pain? No/denies   ? Multiple Pain Sites No   ? ?  ?  ? ?  ? ? ?Capillary Blood Glucose: ?No results found for this or any previous visit (from the past 24 hour(s)). ? ? ? ?Social History  ? ?Tobacco Use  ?Smoking Status Never  ?Smokeless Tobacco Never  ? ? ?Goals Met:  ?Independence with exercise equipment ?Exercise tolerated well ?No report of concerns or symptoms today ?Strength training completed today ? ?Goals Unmet:  ?Not Applicable ? ?Comments: Check out 1200 ? ? ?Dr. Carlyle Dolly is Medical Director for [place] ?

## 2021-05-28 ENCOUNTER — Encounter (HOSPITAL_COMMUNITY)
Admission: RE | Admit: 2021-05-28 | Discharge: 2021-05-28 | Disposition: A | Discharge status: Home / Self Care | Payer: PPO | Source: Ambulatory Visit | Attending: Cardiology | Admitting: Cardiology

## 2021-05-28 DIAGNOSIS — Z951 Presence of aortocoronary bypass graft: Secondary | ICD-10-CM | POA: Diagnosis not present

## 2021-05-28 NOTE — Progress Notes (Signed)
Daily Session Note ? ?Patient Details  ?Name: [name] ?MRN: [id number] ?Date of Birth: [birth year] ?Referring Provider:   ?Flowsheet Row CARDIAC REHAB PHASE II ORIENTATION from 05/19/2021 in [place]  ?Referring Provider Dr. Kipp Brood  ? ?  ? ? ?Encounter Date: 05/28/2021 ? ?Check In: ? Session Check In - 05/28/21 1100   ? ?  ? Check-In  ? Supervising physician immediately available to respond to emergencies [facility] MD immediately available   ? Physician(s) Dr Harrington Challenger   ? Location [facility]-Cardiac & Pulmonary Rehab   ? Staff Present Redge Gainer, BS, Exercise Physiologist;Dennard Vezina Hassell Done, RN, Bjorn Loser, MS, ACSM-CEP, Exercise Physiologist   ? Virtual Visit No   ? Medication changes reported     No   ? Fall or balance concerns reported    No   ? Tobacco Cessation No Change   ? Warm-up and Cool-down Performed as group-led instruction   ? Resistance Training Performed Yes   ? VAD Patient? No   ? PAD/SET Patient? No   ?  ? Pain Assessment  ? Currently in Pain? No/denies   ? Multiple Pain Sites No   ? ?  ?  ? ?  ? ? ?Capillary Blood Glucose: ?No results found for this or any previous visit (from the past 24 hour(s)). ? ? ? ?Social History  ? ?Tobacco Use  ?Smoking Status Never  ?Smokeless Tobacco Never  ? ? ?Goals Met:  ?Independence with exercise equipment ?Exercise tolerated well ?No report of concerns or symptoms today ?Strength training completed today ? ?Goals Unmet:  ?Not Applicable ? ?Comments: Checkout at 1200 ? ? ?Dr. Carlyle Dolly is Medical Director for [place] ?

## 2021-05-31 ENCOUNTER — Encounter (HOSPITAL_COMMUNITY): Payer: PPO

## 2021-05-31 ENCOUNTER — Encounter (HOSPITAL_COMMUNITY)
Admission: RE | Admit: 2021-05-31 | Discharge: 2021-05-31 | Disposition: A | Discharge status: Home / Self Care | Payer: PPO | Source: Ambulatory Visit | Attending: Cardiology | Admitting: Cardiology

## 2021-05-31 VITALS — Wt 247.6 lb

## 2021-05-31 DIAGNOSIS — Z951 Presence of aortocoronary bypass graft: Secondary | ICD-10-CM

## 2021-05-31 NOTE — Progress Notes (Signed)
Daily Session Note ? ?Patient Details  ?Name: [name] ?MRN: [id number] ?Date of Birth: 1940/05/14 ?Referring Provider:   ?Flowsheet Row CARDIAC REHAB PHASE II ORIENTATION from 05/19/2021 in [place]  ?Referring Provider Dr. Kipp Brood  ? ?  ? ? ?Encounter Date: 05/31/2021 ? ?Check In: ? Session Check In - 05/31/21 1054   ? ?  ? Check-In  ? Supervising physician immediately available to respond to emergencies [facility] MD immediately available   ? Physician(s) Dr Johnsie Cancel   ? Staff Present Geanie Cooley, RN;Heather Otho Ket, BS, Exercise Physiologist;Elihu Milstein Hassell Done, RN, BSN   ? Virtual Visit No   ? Medication changes reported     No   ? Fall or balance concerns reported    No   ? Warm-up and Cool-down Performed as group-led instruction   ? Resistance Training Performed Yes   ? VAD Patient? No   ? PAD/SET Patient? No   ?  ? Pain Assessment  ? Currently in Pain? No/denies   ? Multiple Pain Sites No   ? ?  ?  ? ?  ? ? ?Capillary Blood Glucose: ?No results found for this or any previous visit (from the past 24 hour(s)). ? ? ? ?Social History  ? ?Tobacco Use  ?Smoking Status Never  ?Smokeless Tobacco Never  ? ? ?Goals Met:  ?Independence with exercise equipment ?Exercise tolerated well ?No report of concerns or symptoms today ?Strength training completed today ? ?Goals Unmet:  ?Not Applicable ? ?Comments: Checkout at 1200. ? ? ?Dr. Carlyle Dolly is Medical Director for [place] ?

## 2021-06-02 ENCOUNTER — Encounter (HOSPITAL_COMMUNITY)
Admission: RE | Admit: 2021-06-02 | Discharge: 2021-06-02 | Disposition: A | Discharge status: Home / Self Care | Payer: PPO | Source: Ambulatory Visit | Attending: Cardiology | Admitting: Cardiology

## 2021-06-02 DIAGNOSIS — Z951 Presence of aortocoronary bypass graft: Secondary | ICD-10-CM

## 2021-06-02 NOTE — Progress Notes (Signed)
Daily Session Note ? ?Patient Details  ?Name: [name] ?MRN: [id number] ?Date of Birth: [birth year] ?Referring Provider:   ?Flowsheet Row CARDIAC REHAB PHASE II ORIENTATION from 05/19/2021 in [place]  ?Referring Provider Dr. Kipp Brood  ? ?  ? ? ?Encounter Date: 06/02/2021 ? ?Check In: ? Session Check In - 06/02/21 1054   ? ?  ? Check-In  ? Supervising physician immediately available to respond to emergencies [facility] MD immediately available   ? Physician(s) Dr Gasper Sells   ? Location [facility]-Cardiac [facility] Pulmonary Rehab   ? Staff Present Geanie Cooley, RN;Heather Otho Ket, [place], Exercise Physiologist;Eiley Mcginnity Hassell Done, RN, Bjorn Loser, MS, ACSM-CEP, Exercise Physiologist   ? Virtual Visit No   ? Medication changes reported     No   ? Fall or balance concerns reported    No   ? Tobacco Cessation No Change   ? Warm-up and Cool-down Performed as group-led instruction   ? Resistance Training Performed Yes   ? VAD Patient? No   ? PAD/SET Patient? No   ?  ? Pain Assessment  ? Currently in Pain? No/denies   ? Multiple Pain Sites No   ? ?  ?  ? ?  ? ? ?Capillary Blood Glucose: ?No results found for this or any previous visit (from the past 24 hour(s)). ? ? ? ?Social History  ? ?Tobacco Use  ?Smoking Status Never  ?Smokeless Tobacco Never  ? ? ?Goals Met:  ?Independence with exercise equipment ?Exercise tolerated well ?No report of concerns or symptoms today ?Strength training completed today ? ?Goals Unmet:  ?Not Applicable ? ?Comments: Checkout at 1200. ? ? ?Dr. Carlyle Dolly is Medical Director for [place] ?

## 2021-06-04 ENCOUNTER — Encounter (HOSPITAL_COMMUNITY)
Admission: RE | Admit: 2021-06-04 | Discharge: 2021-06-04 | Disposition: A | Discharge status: Home / Self Care | Payer: PPO | Source: Ambulatory Visit | Attending: Cardiology | Admitting: Cardiology

## 2021-06-04 DIAGNOSIS — Z951 Presence of aortocoronary bypass graft: Secondary | ICD-10-CM | POA: Diagnosis not present

## 2021-06-04 NOTE — Progress Notes (Signed)
I have reviewed a Home Exercise Prescription with Dian Situ . [name] is  currently exercising at home.  The patient was advised to walk 2 days a week for 30-45 minutes.  [name] and I discussed how to progress their exercise prescription.  The patient stated that their goals were build back his strength.  The patient stated that they understand the exercise prescription.  We reviewed exercise guidelines, target heart rate during exercise, RPE Scale, weather conditions, NTG use, endpoints for exercise, warmup and cool down.  Patient is encouraged to come to me with any questions. I will continue to follow up with the patient to assist them with progression and safety.    ?

## 2021-06-04 NOTE — Progress Notes (Signed)
Daily Session Note ? ?Patient Details  ?Name: [name] ?MRN: [id number] ?Date of Birth: Jan 15, 1941 ?Referring Provider:   ?Flowsheet Row CARDIAC REHAB PHASE II ORIENTATION from 05/19/2021 in [place]  ?Referring Provider Dr. Kipp Brood  ? ?  ? ? ?Encounter Date: 06/04/2021 ? ?Check In: ? Session Check In - 06/04/21 1100   ? ?  ? Check-In  ? Supervising physician immediately available to respond to emergencies [facility] MD immediately available   ? Physician(s) Nishan   ? Location [facility]   ? Staff Present Aundra Dubin, RN, Bjorn Loser, MS, ACSM-CEP, Exercise Physiologist;Heather Zigmund Daniel, Exercise Physiologist   ? Virtual Visit No   ? Medication changes reported     No   ? Fall or balance concerns reported    No   ? Tobacco Cessation No Change   ? Warm-up and Cool-down Performed as group-led instruction   ? Resistance Training Performed Yes   ? VAD Patient? No   ? PAD/SET Patient? No   ?  ? Pain Assessment  ? Currently in Pain? No/denies   ? Multiple Pain Sites No   ? ?  ?  ? ?  ? ? ?Capillary Blood Glucose: ?No results found for this or any previous visit (from the past 24 hour(s)). ? ? ? ?Social History  ? ?Tobacco Use  ?Smoking Status Never  ?Smokeless Tobacco Never  ? ? ?Goals Met:  ?Independence with exercise equipment ?Exercise tolerated well ?No report of concerns or symptoms today ?Strength training completed today ? ?Goals Unmet:  ?Not Applicable ? ?Comments: Check out 1200. ? ? ?Dr. Carlyle Dolly is Medical Director for [place] ?

## 2021-06-07 ENCOUNTER — Encounter (HOSPITAL_COMMUNITY)
Admission: RE | Admit: 2021-06-07 | Discharge: 2021-06-07 | Disposition: A | Discharge status: Home / Self Care | Payer: PPO | Source: Ambulatory Visit | Attending: Cardiology | Admitting: Cardiology

## 2021-06-07 DIAGNOSIS — Z951 Presence of aortocoronary bypass graft: Secondary | ICD-10-CM | POA: Diagnosis not present

## 2021-06-07 NOTE — Progress Notes (Signed)
Daily Session Note ? ?Patient Details  ?Name: [name] ?MRN: [id number] ?Date of Birth: 1940-07-07 ?Referring Provider:   ?Flowsheet Row CARDIAC REHAB PHASE II ORIENTATION from 05/19/2021 in [place]  ?Referring Provider Dr. Kipp Brood  ? ?  ? ? ?Encounter Date: 06/07/2021 ? ?Check In: ? Session Check In - 06/07/21 1056   ? ?  ? Check-In  ? Supervising physician immediately available to respond to emergencies [facility] MD immediately available   ? Physician(s) Dr Johnsie Cancel   ? Location [facility]   ? Staff Present Redge Gainer, BS, Exercise Physiologist;Cachet Mccutchen Hassell Done, RN, Bjorn Loser, MS, ACSM-CEP, Exercise Physiologist   ? Virtual Visit No   ? Medication changes reported     No   ? Fall or balance concerns reported    No   ? Tobacco Cessation No Change   ? Warm-up and Cool-down Performed as group-led instruction   ? Resistance Training Performed Yes   ? VAD Patient? No   ? PAD/SET Patient? No   ?  ? Pain Assessment  ? Currently in Pain? No/denies   ? Multiple Pain Sites No   ? ?  ?  ? ?  ? ? ?Capillary Blood Glucose: ?No results found for this or any previous visit (from the past 24 hour(s)). ? ? ? ?Social History  ? ?Tobacco Use  ?Smoking Status Never  ?Smokeless Tobacco Never  ? ? ?Goals Met:  ?Independence with exercise equipment ?Exercise tolerated well ?No report of concerns or symptoms today ?Strength training completed today ? ?Goals Unmet:  ?Not Applicable ? ?Comments: Check out at 1200. ? ? ?Dr. Carlyle Dolly is Medical Director for [place] ?

## 2021-06-09 ENCOUNTER — Encounter (HOSPITAL_COMMUNITY)
Admission: RE | Admit: 2021-06-09 | Discharge: 2021-06-09 | Disposition: A | Discharge status: Home / Self Care | Payer: PPO | Source: Ambulatory Visit | Attending: Cardiology | Admitting: Cardiology

## 2021-06-09 DIAGNOSIS — Z951 Presence of aortocoronary bypass graft: Secondary | ICD-10-CM

## 2021-06-09 NOTE — Progress Notes (Signed)
Daily Session Note ? ?Patient Details  ?Name: [name] ?MRN: [id number] ?Date of Birth: 1940/11/24 ?Referring Provider:   ?Flowsheet Row CARDIAC REHAB PHASE II ORIENTATION from 05/19/2021 in [place]  ?Referring Provider Dr. Kipp Brood  ? ?  ? ? ?Encounter Date: 06/09/2021 ? ?Check In: ? Session Check In - 06/09/21 1052   ? ?  ? Check-In  ? Supervising physician immediately available to respond to emergencies [facility] MD immediately available   ? Physician(s) Dr Harl Bowie   ? Location [facility]   ? Staff Present Madelyn Flavors, RN, Bjorn Loser, MS, ACSM-CEP, Exercise Physiologist;Heather Zigmund Daniel, Exercise Physiologist   ? Virtual Visit No   ? Medication changes reported     No   ? Fall or balance concerns reported    No   ? Tobacco Cessation No Change   ? Warm-up and Cool-down Performed as group-led instruction   ? Resistance Training Performed Yes   ? VAD Patient? No   ? PAD/SET Patient? No   ?  ? Pain Assessment  ? Currently in Pain? No/denies   ? Multiple Pain Sites No   ? ?  ?  ? ?  ? ? ?Capillary Blood Glucose: ?No results found for this or any previous visit (from the past 24 hour(s)). ? ? ? ?Social History  ? ?Tobacco Use  ?Smoking Status Never  ?Smokeless Tobacco Never  ? ? ?Goals Met:  ?Independence with exercise equipment ?Personal goals reviewed ?No report of concerns or symptoms today ?Strength training completed today ? ?Goals Unmet:  ?Not Applicable ? ?Comments: Checkout at 1200. ? ? ?Dr. Carlyle Dolly is Medical Director for [place] ?

## 2021-06-11 ENCOUNTER — Encounter (HOSPITAL_COMMUNITY)
Admission: RE | Admit: 2021-06-11 | Discharge: 2021-06-11 | Disposition: A | Discharge status: Home / Self Care | Payer: PPO | Source: Ambulatory Visit | Attending: Cardiology | Admitting: Cardiology

## 2021-06-11 DIAGNOSIS — Z951 Presence of aortocoronary bypass graft: Secondary | ICD-10-CM | POA: Diagnosis not present

## 2021-06-11 NOTE — Progress Notes (Signed)
Daily Session Note ? ?Patient Details  ?Name: [name] ?MRN: [id number] ?Date of Birth: 02/06/1941 ?Referring Provider:   ?Flowsheet Row CARDIAC REHAB PHASE II ORIENTATION from 05/19/2021 in [place]  ?Referring Provider Dr. Kipp Brood  ? ?  ? ? ?Encounter Date: 06/11/2021 ? ?Check In: ? Session Check In - 06/11/21 1050   ? ?  ? Check-In  ? Supervising physician immediately available to respond to emergencies [facility] MD immediately available   ? Physician(s) Dr Harrington Challenger   ? Location [facility]   ? Staff Present Madelyn Flavors, RN, BSN;Christy Edwards, RN, BSN;Heather Otho Ket, BS, Exercise Physiologist   ? Virtual Visit No   ? Medication changes reported     No   ? Fall or balance concerns reported    No   ? Tobacco Cessation No Change   ? Warm-up and Cool-down Performed as group-led instruction   ? Resistance Training Performed Yes   ? VAD Patient? No   ? PAD/SET Patient? No   ?  ? Pain Assessment  ? Currently in Pain? No/denies   ? Multiple Pain Sites No   ? ?  ?  ? ?  ? ? ?Capillary Blood Glucose: ?No results found for this or any previous visit (from the past 24 hour(s)). ? ? ? ?Social History  ? ?Tobacco Use  ?Smoking Status Never  ?Smokeless Tobacco Never  ? ? ?Goals Met:  ?Independence with exercise equipment ?Exercise tolerated well ?No report of concerns or symptoms today ?Strength training completed today ? ?Goals Unmet:  ?Not Applicable ? ?Comments: Check out at 1200. ? ? ?Dr. Carlyle Dolly is Medical Director for [place] ?

## 2021-06-14 ENCOUNTER — Encounter (HOSPITAL_COMMUNITY)
Admission: RE | Admit: 2021-06-14 | Discharge: 2021-06-14 | Disposition: A | Discharge status: Home / Self Care | Payer: PPO | Source: Ambulatory Visit | Attending: Cardiology | Admitting: Cardiology

## 2021-06-14 VITALS — Wt 250.2 lb

## 2021-06-14 DIAGNOSIS — Z951 Presence of aortocoronary bypass graft: Secondary | ICD-10-CM

## 2021-06-14 NOTE — Progress Notes (Signed)
Daily Session Note ? ?Patient Details  ?Name: [name] ?MRN: [id number] ?Date of Birth: [birth year] ?Referring Provider:   ?Flowsheet Row CARDIAC REHAB PHASE II ORIENTATION from 05/19/2021 in [place]  ?Referring Provider Dr. Kipp Brood  ? ?  ? ? ?Encounter Date: 06/14/2021 ? ?Check In: ? Session Check In - 06/14/21 1100   ? ?  ? Check-In  ? Supervising physician immediately available to respond to emergencies [facility] MD immediately available   ? Physician(s) Chandrasekhar   ? Location [facility]   ? Staff Present Redge Gainer, BS, Exercise Physiologist;Bertram Haddix Wynetta Emery, RN, BSN   ? Virtual Visit No   ? Medication changes reported     No   ? Fall or balance concerns reported    No   ? Tobacco Cessation No Change   ? Warm-up and Cool-down Performed as group-led instruction   ? Resistance Training Performed Yes   ? VAD Patient? No   ? PAD/SET Patient? No   ?  ? Pain Assessment  ? Currently in Pain? No/denies   ? Multiple Pain Sites No   ? ?  ?  ? ?  ? ? ?Capillary Blood Glucose: ?No results found for this or any previous visit (from the past 24 hour(s)). ? ? ? ?Social History  ? ?Tobacco Use  ?Smoking Status Never  ?Smokeless Tobacco Never  ? ? ?Goals Met:  ?Independence with exercise equipment ?Exercise tolerated well ?No report of concerns or symptoms today ?Strength training completed today ? ?Goals Unmet:  ?Not Applicable ? ?Comments: Check out 1200. ? ? ?Dr. Carlyle Dolly is Medical Director for [place] ?

## 2021-06-16 ENCOUNTER — Encounter (HOSPITAL_COMMUNITY)
Admission: RE | Admit: 2021-06-16 | Discharge: 2021-06-16 | Disposition: A | Discharge status: Home / Self Care | Payer: PPO | Source: Ambulatory Visit | Attending: Cardiology | Admitting: Cardiology

## 2021-06-16 DIAGNOSIS — Z951 Presence of aortocoronary bypass graft: Secondary | ICD-10-CM | POA: Diagnosis not present

## 2021-06-16 NOTE — Progress Notes (Signed)
Cardiac Individual Treatment Plan ? ?Patient Details  ?Name: [name] ?MRN: [id number] ?Date of Birth: 31-Jan-1941 ?Referring Provider:   ?Flowsheet Row CARDIAC REHAB PHASE II ORIENTATION from 05/19/2021 in [place]  ?Referring Provider Dr. Kipp Brood  ? ?  ? ? ?Initial Encounter Date:  ?Flowsheet Row CARDIAC REHAB PHASE II ORIENTATION from 05/19/2021 in [place]  ?Date 05/19/21  ? ?  ? ? ?Visit Diagnosis: S/P CABG x 2 ? ?Patient's Home Medications on Admission: ? ?Current Outpatient Medications:  ?  acetaminophen (TYLENOL) 500 MG tablet, Take 500-1,000 mg by mouth every 6 (six) hours as needed (for headaches.)., Disp: , Rfl:  ?  apixaban (ELIQUIS) 2.5 MG TABS tablet, Take 1 tablet (2.5 mg total) by mouth 2 (two) times daily., Disp: 60 tablet, Rfl: 3 ?  aspirin EC 81 MG tablet, Take 81 mg by mouth in the morning. Swallow whole., Disp: , Rfl:  ?  atorvastatin (LIPITOR) 20 MG tablet, Take 1 tablet (20 mg total) by mouth daily. (Patient taking differently: Take 20 mg by mouth every evening.), Disp: 90 tablet, Rfl: 3 ?  Metoprolol Tartrate (LOPRESSOR) 25 MG tablet, Take 1.5 tablets (37.5 mg total) by mouth 2 (two) times daily., Disp: 90 tablet, Rfl: 3 ? ?Past Medical History: ?Past Medical History:  ?Diagnosis Date  ? Arthritis   ? CAD (coronary artery disease)   ? Two-vessel obstructive disease November 2022; CABG with LIMA to LAD and SVG to PDA January 2023  ? Chronic back pain   ? CKD (chronic kidney disease) stage 3, GFR 30-59 ml/min (HCC)   ? Essential hypertension   ? GERD (gastroesophageal reflux disease)   ? Helicobacter pylori gastritis   ? Treated  ? History of kidney stones   ? Hx of adenomatous colonic polyps   ? IBS (irritable bowel syndrome)   ? PAF (paroxysmal atrial fibrillation) ([place])   ? Prostate CA [facility])   ? Seed implants  ? Schatzki's ring   ? Stroke [facility])   ? ? ?Tobacco Use: ?Social History  ? ?Tobacco Use  ?Smoking Status Never  ?Smokeless Tobacco  Never  ? ? ?Labs: ?Review Flowsheet   ? ?  ?  Latest Ref Rng & Units 10/24/2011 11/10/2015 03/05/2021 03/09/2021  ?Labs for [facility] Cardiac and Pulmonary Rehab  ?Cholestrol 125 - 200 mg/dL  200      ?LDL (calc) <130 mg/dL  124      ?HDL-C >=40 mg/dL  34      ?Trlycerides <150 mg/dL  208      ?Hemoglobin A1c 4.8 - 5.6 %   5.7     ?PH, Arterial 7.350 - 7.450   7.379   7.314    ? 7.266    ? 7.369    ? 7.409    ?PCO2 arterial 32.0 - 48.0 mmHg   43.9   41.5    ? 43.6    ? 36.4    ? 36.8    ?Bicarbonate 20.0 - 28.0 mmol/L   25.3   20.8    ? 19.6    ? 20.9    ? 23.8    ?TCO2 22 - 32 mmol/L 28     22    ? 21    ? 22    ? 25    ? 25    ? 23    ? 26    ?Acid-base deficit 0.0 - 2.0 mmol/L    5.0    ? 7.0    ?  4.0    ? 1.0    ?O2 Saturation %   93.7   94.0    ? 89.0    ? 96.0    ? 97.0    ?  ? ? Multiple values from one day are sorted in reverse-chronological order  ?  ?  ? ? ?Capillary Blood Glucose: ?Lab Results  ?Component Value Date  ? GLUCAP 111 (H) 03/14/2021  ? GLUCAP 115 (H) 03/12/2021  ? GLUCAP 119 (H) 03/12/2021  ? GLUCAP 133 (H) 03/12/2021  ? GLUCAP 138 (H) 03/11/2021  ? ? ? ?Exercise Target Goals: ?Exercise Program Goal: ?Individual exercise prescription set using results from initial 6 min walk test and THRR while considering  patient?s activity barriers and safety.  ? ?Exercise Prescription Goal: ?Starting with aerobic activity 30 plus minutes a day, 3 days per week for initial exercise prescription. Provide home exercise prescription and guidelines that participant acknowledges understanding prior to discharge. ? ?Activity Barriers & Risk Stratification: ? Activity Barriers & Cardiac Risk Stratification - 05/19/21 1300   ? ?  ? Activity Barriers & Cardiac Risk Stratification  ? Activity Barriers Arthritis;Back Problems;Left Knee Replacement;Right Knee Replacement;Neck/Spine Problems;Shortness of Breath   ? Cardiac Risk Stratification High   ? ?  ?  ? ?  ? ? ?6 Minute Walk: ? 6 Minute Walk   ? ? [place] Name 05/19/21 1358  ?   ?  ?  ? 6 Minute Walk  ? Phase Initial    ? Distance 1200 feet    ? Walk Time 6 minutes    ? # of Rest Breaks 0    ? MPH 2.27    ? METS 1.75    ? RPE 12    ? VO2 Peak 6.12    ? Symptoms No    ? Resting HR 47 bpm    ? Resting BP 128/56    ? Resting Oxygen Saturation  96 %    ? Exercise Oxygen Saturation  during 6 min walk 97 %    ? Max Ex. HR 75 bpm    ? Max Ex. BP 136/60    ? 2 Minute Post BP 126/60    ? ?  ?  ? ?  ? ? ?Oxygen Initial Assessment: ? ? ?Oxygen Re-Evaluation: ? ? ?Oxygen Discharge (Final Oxygen Re-Evaluation): ? ? ?Initial Exercise Prescription: ? Initial Exercise Prescription - 05/19/21 1400   ? ?  ? Date of Initial Exercise RX and Referring Provider  ? Date 05/19/21   ? Referring Provider Dr. Kipp Brood   ? Expected Discharge Date 08/13/21   ?  ? Treadmill  ? MPH 1.3   ? Grade 2   ? Minutes 17   ?  ? Recumbant Elliptical  ? Level 1   ? RPM 60   ? Minutes 22   ?  ? Prescription Details  ? Frequency (times per week) 3   ? Duration Progress to 30 minutes of continuous aerobic without signs/symptoms of physical distress   ?  ? Intensity  ? THRR 40-80% of Max Heartrate 56-111   ? Ratings of Perceived Exertion 11-13   ?  ? Resistance Training  ? Training Prescription Yes   ? Weight 3   ? Reps 10-15   ? ?  ?  ? ?  ? ? ?Perform Capillary Blood Glucose checks as needed. ? ?Exercise Prescription Changes: ? ? Exercise Prescription Changes   ? ? [place] Name 05/31/21 1200 06/04/21 1100 06/14/21 1200  ?  ?  ?  ?  Response to Exercise  ? Blood Pressure (Admit) 150/58 -- 140/60    ? Blood Pressure (Exercise) 156/76 -- 178/62    ? Blood Pressure (Exit) 108/60 -- 142/64    ? Heart Rate (Admit) 46 bpm -- 48 bpm    ? Heart Rate (Exercise) 89 bpm -- 89 bpm    ? Heart Rate (Exit) 55 bpm -- 57 bpm    ? Rating of Perceived Exertion (Exercise) 10 -- 10    ? Duration Continue with 30 min of aerobic exercise without signs/symptoms of physical distress. -- Continue with 30 min of aerobic exercise without signs/symptoms of physical  distress.    ? Intensity THRR unchanged -- THRR unchanged    ?  ? Progression  ? Progression Continue to progress workloads to maintain intensity without signs/symptoms of physical distress. -- Continue to progress workloads to maintain intensity without signs/symptoms of physical distress.    ?  ? Resistance Training  ? Training Prescription Yes -- Yes    ? Weight 4 -- 5    ? Reps 10-15 -- 10-15    ? Time 10 Minutes -- 10 Minutes    ?  ? Treadmill  ? MPH 2.1 -- 2.5    ? Grade 0 -- 0    ? Minutes 17 -- 17    ? METs 2.61 -- 2.91    ?  ? Recumbant Elliptical  ? Level 2 -- 2    ? RPM 51 -- 66    ? Minutes 22 -- 22    ? METs 2.4 -- 3.2    ?  ? Home Exercise Plan  ? Plans to continue exercise at -- Home (comment) --    ? Frequency -- Add 2 additional days to program exercise sessions. --    ? Initial Home Exercises Provided -- 06/04/21 --    ? ?  ?  ? ?  ? ? ?Exercise Comments: ? ? Exercise Comments   ? ? [place] Name 06/04/21 1137  ?  ?  ?  ?  ? Exercise Comments home exercise reviewed      ? ?  ?  ? ?  ? ? ?Exercise Goals and Review: ? ? Exercise Goals   ? ? [place] Name 05/19/21 1402 06/14/21 1234  ?  ?  ?  ?  ? Exercise Goals  ? Increase Physical Activity Yes Yes     ? Intervention Provide advice, education, support and counseling about physical activity/exercise needs.;Develop an individualized exercise prescription for aerobic and resistive training based on initial evaluation findings, risk stratification, comorbidities and participant's personal goals. Provide advice, education, support and counseling about physical activity/exercise needs.;Develop an individualized exercise prescription for aerobic and resistive training based on initial evaluation findings, risk stratification, comorbidities and participant's personal goals.     ? Expected Outcomes Short Term: Attend rehab on a regular basis to increase amount of physical activity.;Long Term: Add in home exercise to make exercise part of routine and to increase amount  of physical activity.;Long Term: Exercising regularly at least 3-5 days a week. Short Term: Attend rehab on a regular basis to increase amount of physical activity.;Long Term: Add in home exercise to make

## 2021-06-16 NOTE — Progress Notes (Signed)
Daily Session Note ? ?Patient Details  ?Name: [name] ?MRN: [id number] ?Date of Birth: 01/01/1941 ?Referring Provider:   ?Flowsheet Row CARDIAC REHAB PHASE II ORIENTATION from 05/19/2021 in [place] CARDIAC REHABILITATION  ?Referring Provider Dr. Lightfoot  ? ?  ? ? ?Encounter Date: 06/16/2021 ? ?Check In: ? Session Check In - 06/16/21 1100   ? ?  ? Check-In  ? Supervising physician immediately available to respond to emergencies [facility] MD immediately available   ? Physician(s) Dr. McDowell   ? Location [facility]   ? Staff Present Heather Jachimiak, BS, Exercise Physiologist; , MS, ACSM-CEP, Exercise Physiologist   ? Virtual Visit No   ? Medication changes reported     No   ? Fall or balance concerns reported    No   ? Tobacco Cessation No Change   ? Warm-up and Cool-down Performed as group-led instruction   ? Resistance Training Performed Yes   ? VAD Patient? No   ? PAD/SET Patient? No   ?  ? Pain Assessment  ? Currently in Pain? No/denies   ? Multiple Pain Sites No   ? ?  ?  ? ?  ? ? ?Capillary Blood Glucose: ?No results found for this or any previous visit (from the past 24 hour(s)). ? ? ? ?Social History  ? ?Tobacco Use  ?Smoking Status Never  ?Smokeless Tobacco Never  ? ? ?Goals Met:  ?Independence with exercise equipment ?Exercise tolerated well ?No report of concerns or symptoms today ?Strength training completed today ? ?Goals Unmet:  ?Not Applicable ? ?Comments: checkout time is 1200 ? ? ?Dr. Jonathan Branch is Medical Director for [place] [facility] ?

## 2021-06-18 ENCOUNTER — Encounter (HOSPITAL_COMMUNITY)
Admission: RE | Admit: 2021-06-18 | Discharge: 2021-06-18 | Disposition: A | Discharge status: Home / Self Care | Payer: PPO | Source: Ambulatory Visit | Attending: Cardiology | Admitting: Cardiology

## 2021-06-18 DIAGNOSIS — Z951 Presence of aortocoronary bypass graft: Secondary | ICD-10-CM | POA: Diagnosis not present

## 2021-06-18 NOTE — Progress Notes (Signed)
Daily Session Note ? ?Patient Details  ?Name: [name] ?MRN: [id number] ?Date of Birth: [birth year] ?Referring Provider:   ?Flowsheet Row CARDIAC REHAB PHASE II ORIENTATION from 05/19/2021 in [place]  ?Referring Provider Dr. Kipp Brood  ? ?  ? ? ?Encounter Date: 06/18/2021 ? ?Check In: ? Session Check In - 06/18/21 1056   ? ?  ? Check-In  ? Supervising physician immediately available to respond to emergencies [facility] MD immediately available   ? Physician(s) Dr. Domenic Polite   ? Staff Present Aundra Dubin, RN, BSN;Heather Otho Ket, BS, Exercise Physiologist   ? Virtual Visit No   ? Medication changes reported     No   ? Fall or balance concerns reported    No   ? Tobacco Cessation No Change   ? Warm-up and Cool-down Performed as group-led instruction   ? Resistance Training Performed Yes   ? VAD Patient? No   ? PAD/SET Patient? No   ?  ? Pain Assessment  ? Currently in Pain? No/denies   ? Multiple Pain Sites No   ? ?  ?  ? ?  ? ? ?Capillary Blood Glucose: ?No results found for this or any previous visit (from the past 24 hour(s)). ? ? ? ?Social History  ? ?Tobacco Use  ?Smoking Status Never  ?Smokeless Tobacco Never  ? ? ?Goals Met:  ?Independence with exercise equipment ?Exercise tolerated well ?No report of concerns or symptoms today ?Strength training completed today ? ?Goals Unmet:  ?Not Applicable ? ?Comments: check out at 1200. ? ? ?Dr. Carlyle Dolly is Medical Director for [place] ?

## 2021-06-21 ENCOUNTER — Encounter (HOSPITAL_COMMUNITY)
Admission: RE | Admit: 2021-06-21 | Discharge: 2021-06-21 | Disposition: A | Discharge status: Home / Self Care | Payer: PPO | Source: Ambulatory Visit | Attending: Cardiology | Admitting: Cardiology

## 2021-06-21 DIAGNOSIS — Z951 Presence of aortocoronary bypass graft: Secondary | ICD-10-CM | POA: Diagnosis not present

## 2021-06-21 NOTE — Progress Notes (Signed)
Daily Session Note ? ?Patient Details  ?Name: [name] ?MRN: [id number] ?Date of Birth: Apr 14, 1940 ?Referring Provider:   ?Flowsheet Row CARDIAC REHAB PHASE II ORIENTATION from 05/19/2021 in [place]  ?Referring Provider Dr. Kipp Brood  ? ?  ? ? ?Encounter Date: 06/21/2021 ? ?Check In: ? Session Check In - 06/21/21 1100   ? ?  ? Check-In  ? Supervising physician immediately available to respond to emergencies [facility] MD immediately available   ? Physician(s) Dr. Domenic Polite   ? Location [facility]   ? Staff Present Aundra Dubin, RN, BSN;Heather Otho Ket, BS, Exercise Physiologist;Inari Shin Hassell Done, RN, BSN   ? Virtual Visit No   ? Medication changes reported     No   ? Fall or balance concerns reported    No   ? Tobacco Cessation No Change   ? Warm-up and Cool-down Performed as group-led instruction   ? Resistance Training Performed Yes   ? VAD Patient? No   ? PAD/SET Patient? No   ?  ? Pain Assessment  ? Currently in Pain? No/denies   ? Multiple Pain Sites No   ? ?  ?  ? ?  ? ? ?Capillary Blood Glucose: ?No results found for this or any previous visit (from the past 24 hour(s)). ? ? ? ?Social History  ? ?Tobacco Use  ?Smoking Status Never  ?Smokeless Tobacco Never  ? ? ?Goals Met:  ?Independence with exercise equipment ?Exercise tolerated well ?No report of concerns or symptoms today ?Strength training completed today ? ?Goals Unmet:  ?Not Applicable ? ?Comments: Check out at 1200. ? ? ?Dr. Carlyle Dolly is Medical Director for [place] ?

## 2021-06-23 ENCOUNTER — Encounter (HOSPITAL_COMMUNITY)
Admission: RE | Admit: 2021-06-23 | Discharge: 2021-06-23 | Disposition: A | Discharge status: Home / Self Care | Payer: PPO | Source: Ambulatory Visit | Attending: Cardiology | Admitting: Cardiology

## 2021-06-23 DIAGNOSIS — Z951 Presence of aortocoronary bypass graft: Secondary | ICD-10-CM

## 2021-06-23 NOTE — Progress Notes (Signed)
Daily Session Note ? ?Patient Details  ?Name: [name] ?MRN: [id number] ?Date of Birth: 1940-05-07 ?Referring Provider:   ?Flowsheet Row CARDIAC REHAB PHASE II ORIENTATION from 05/19/2021 in [place]  ?Referring Provider Dr. Kipp Brood  ? ?  ? ? ?Encounter Date: 06/23/2021 ? ?Check In: ? Session Check In - 06/23/21 1100   ? ?  ? Check-In  ? Supervising physician immediately available to respond to emergencies [facility] MD immediately available   ? Physician(s) Dr. Gardiner Rhyme   ? Location [facility]   ? Staff Present Redge Gainer, BS, Exercise Physiologist;Debra Wynetta Emery, RN, Bjorn Loser, MS, ACSM-CEP, Exercise Physiologist   ? Virtual Visit No   ? Medication changes reported     No   ? Fall or balance concerns reported    No   ? Tobacco Cessation No Change   ? Warm-up and Cool-down Performed as group-led instruction   ? Resistance Training Performed Yes   ? VAD Patient? No   ? PAD/SET Patient? No   ?  ? Pain Assessment  ? Currently in Pain? No/denies   ? Multiple Pain Sites No   ? ?  ?  ? ?  ? ? ?Capillary Blood Glucose: ?No results found for this or any previous visit (from the past 24 hour(s)). ? ? ? ?Social History  ? ?Tobacco Use  ?Smoking Status Never  ?Smokeless Tobacco Never  ? ? ?Goals Met:  ?Independence with exercise equipment ?Exercise tolerated well ?No report of concerns or symptoms today ?Strength training completed today ? ?Goals Unmet:  ?Not Applicable ? ?Comments: check out 1200 ? ? ?Dr. Carlyle Dolly is Medical Director for [place] ?

## 2021-06-25 ENCOUNTER — Encounter (HOSPITAL_COMMUNITY)
Admission: RE | Admit: 2021-06-25 | Discharge: 2021-06-25 | Disposition: A | Discharge status: Home / Self Care | Payer: PPO | Source: Ambulatory Visit | Attending: Cardiology | Admitting: Cardiology

## 2021-06-25 DIAGNOSIS — Z951 Presence of aortocoronary bypass graft: Secondary | ICD-10-CM | POA: Diagnosis not present

## 2021-06-25 NOTE — Progress Notes (Signed)
Daily Session Note ? ?Patient Details  ?Name: [name] ?MRN: [id number] ?Date of Birth: [birth year] ?Referring Provider:   ?Flowsheet Row CARDIAC REHAB PHASE II ORIENTATION from 05/19/2021 in [place]  ?Referring Provider Dr. Kipp Brood  ? ?  ? ? ?Encounter Date: 06/25/2021 ? ?Check In: ? Session Check In - 06/25/21 1053   ? ?  ? Check-In  ? Supervising physician immediately available to respond to emergencies [facility] MD immediately available   ? Physician(s) Dr. Radford Pax   ? Location [facility]   ? Staff Present Redge Gainer, BS, Exercise Physiologist;Dalton Kris Mouton, MS, ACSM-CEP, Exercise Physiologist;Kenith Trickel Hassell Done, RN, BSN   ? Virtual Visit No   ? Medication changes reported     No   ? Fall or balance concerns reported    No   ? Tobacco Cessation No Change   ? Warm-up and Cool-down Performed as group-led instruction   ? Resistance Training Performed Yes   ? VAD Patient? No   ? PAD/SET Patient? No   ?  ? Pain Assessment  ? Currently in Pain? No/denies   ? Multiple Pain Sites No   ? ?  ?  ? ?  ? ? ?Capillary Blood Glucose: ?No results found for this or any previous visit (from the past 24 hour(s)). ? ? ? ?Social History  ? ?Tobacco Use  ?Smoking Status Never  ?Smokeless Tobacco Never  ? ? ?Goals Met:  ?Independence with exercise equipment ?Exercise tolerated well ?No report of concerns or symptoms today ?Strength training completed today ? ?Goals Unmet:  ?Not Applicable ? ?Comments: check out 1200. ? ? ?Dr. Carlyle Dolly is Medical Director for [place] ?

## 2021-06-28 ENCOUNTER — Encounter (HOSPITAL_COMMUNITY)
Admission: RE | Admit: 2021-06-28 | Discharge: 2021-06-28 | Disposition: A | Discharge status: Home / Self Care | Payer: PPO | Source: Ambulatory Visit | Attending: Cardiology | Admitting: Cardiology

## 2021-06-28 VITALS — Wt 247.8 lb

## 2021-06-28 DIAGNOSIS — Z951 Presence of aortocoronary bypass graft: Secondary | ICD-10-CM

## 2021-06-28 NOTE — Progress Notes (Signed)
Daily Session Note ? ?Patient Details  ?Name: [name] ?MRN: [id number] ?Date of Birth: [birth year] ?Referring Provider:   ?Flowsheet Row CARDIAC REHAB PHASE II ORIENTATION from 05/19/2021 in [place]  ?Referring Provider Dr. Kipp Brood  ? ?  ? ? ?Encounter Date: 06/28/2021 ? ?Check In: ? Session Check In - 06/28/21 1056   ? ?  ? Check-In  ? Supervising physician immediately available to respond to emergencies [facility] MD immediately available   ? Physician(s) O'[place]   ? Location [facility]   ? Staff Present Redge Gainer, BS, Exercise Physiologist;Eather Chaires Wynetta Emery, RN, Bjorn Loser, MS, ACSM-CEP, Exercise Physiologist   ? Virtual Visit No   ? Medication changes reported     No   ? Fall or balance concerns reported    No   ? Tobacco Cessation No Change   ? Warm-up and Cool-down Performed as group-led instruction   ? Resistance Training Performed Yes   ? VAD Patient? No   ? PAD/SET Patient? No   ?  ? Pain Assessment  ? Currently in Pain? No/denies   ? Multiple Pain Sites No   ? ?  ?  ? ?  ? ? ?Capillary Blood Glucose: ?No results found for this or any previous visit (from the past 24 hour(s)). ? ? ? ?Social History  ? ?Tobacco Use  ?Smoking Status Never  ?Smokeless Tobacco Never  ? ? ?Goals Met:  ?Independence with exercise equipment ?Exercise tolerated well ?No report of concerns or symptoms today ?Strength training completed today ? ?Goals Unmet:  ?Not Applicable ? ?Comments: Check out 1200. ? ? ?Dr. Carlyle Dolly is Medical Director for [place] ?

## 2021-06-30 ENCOUNTER — Encounter (HOSPITAL_COMMUNITY)
Admission: RE | Admit: 2021-06-30 | Discharge: 2021-06-30 | Disposition: A | Discharge status: Home / Self Care | Payer: PPO | Source: Ambulatory Visit | Attending: Cardiology | Admitting: Cardiology

## 2021-06-30 DIAGNOSIS — Z951 Presence of aortocoronary bypass graft: Secondary | ICD-10-CM

## 2021-06-30 NOTE — Progress Notes (Signed)
Daily Session Note ? ?Patient Details  ?Name: [name] ?MRN: [id number] ?Date of Birth: [birth year] ?Referring Provider:   ?Flowsheet Row CARDIAC REHAB PHASE II ORIENTATION from 05/19/2021 in [place]  ?Referring Provider Dr. Kipp Brood  ? ?  ? ? ?Encounter Date: 06/30/2021 ? ?Check In: ? Session Check In - 06/30/21 1056   ? ?  ? Check-In  ? Supervising physician immediately available to respond to emergencies [facility] MD immediately available   ? Physician(s) Dr. Harrington Challenger   ? Location [facility]   ? Staff Present Geanie Cooley, RN;Debra Wynetta Emery, RN, Bjorn Loser, MS, ACSM-CEP, Exercise Physiologist   ? Virtual Visit No   ? Medication changes reported     No   ? Fall or balance concerns reported    No   ? Tobacco Cessation No Change   ? Warm-up and Cool-down Performed as group-led instruction   ? Resistance Training Performed Yes   ? VAD Patient? No   ? PAD/SET Patient? No   ?  ? Pain Assessment  ? Currently in Pain? No/denies   ? Multiple Pain Sites No   ? ?  ?  ? ?  ? ? ?Capillary Blood Glucose: ?No results found for this or any previous visit (from the past 24 hour(s)). ? ? ? ?Social History  ? ?Tobacco Use  ?Smoking Status Never  ?Smokeless Tobacco Never  ? ? ?Goals Met:  ?Independence with exercise equipment ?Exercise tolerated well ?No report of concerns or symptoms today ?Strength training completed today ? ?Goals Unmet:  ?Not Applicable ? ?Comments: check out 2 12:00pm ? ? ?Dr. Carlyle Dolly is Medical Director for [place] ?

## 2021-07-02 ENCOUNTER — Encounter (HOSPITAL_COMMUNITY)
Admission: RE | Admit: 2021-07-02 | Discharge: 2021-07-02 | Disposition: A | Discharge status: Home / Self Care | Payer: PPO | Source: Ambulatory Visit | Attending: Cardiology | Admitting: Cardiology

## 2021-07-02 DIAGNOSIS — Z951 Presence of aortocoronary bypass graft: Secondary | ICD-10-CM

## 2021-07-02 NOTE — Progress Notes (Signed)
Daily Session Note ? ?Patient Details  ?Name: [name] ?MRN: [id number] ?Date of Birth: 05-Jan-1941 ?Referring Provider:   ?Flowsheet Row CARDIAC REHAB PHASE II ORIENTATION from 05/19/2021 in [place]  ?Referring Provider Dr. Kipp Brood  ? ?  ? ? ?Encounter Date: 07/02/2021 ? ?Check In: ? Session Check In - 07/02/21 1056   ? ?  ? Check-In  ? Supervising physician immediately available to respond to emergencies [facility] MD immediately available   ? Physician(s) Dr Marlou Porch   ? Location [facility]   ? Staff Present Aundra Dubin, RN, BSN;Heather Otho Ket, BS, Exercise Physiologist;[place] Howerter Hassell Done, RN, BSN   ? Virtual Visit No   ? Medication changes reported     No   ? Fall or balance concerns reported    No   ? Tobacco Cessation No Change   ? Warm-up and Cool-down Performed as group-led instruction   ? Resistance Training Performed Yes   ? VAD Patient? No   ? PAD/SET Patient? No   ?  ? Pain Assessment  ? Currently in Pain? No/denies   ? Multiple Pain Sites No   ? ?  ?  ? ?  ? ? ?Capillary Blood Glucose: ?No results found for this or any previous visit (from the past 24 hour(s)). ? ? ? ?Social History  ? ?Tobacco Use  ?Smoking Status Never  ?Smokeless Tobacco Never  ? ? ?Goals Met:  ?Independence with exercise equipment ?Exercise tolerated well ?No report of concerns or symptoms today ?Strength training completed today ? ?Goals Unmet:  ?Not Applicable ? ?Comments: check out at 1200. ? ? ?Dr. Carlyle Dolly is Medical Director for [place] ?

## 2021-07-05 ENCOUNTER — Encounter (HOSPITAL_COMMUNITY)
Admission: RE | Admit: 2021-07-05 | Discharge: 2021-07-05 | Disposition: A | Discharge status: Home / Self Care | Payer: PPO | Source: Ambulatory Visit | Attending: Cardiology | Admitting: Cardiology

## 2021-07-05 DIAGNOSIS — Z951 Presence of aortocoronary bypass graft: Secondary | ICD-10-CM | POA: Diagnosis not present

## 2021-07-05 NOTE — Progress Notes (Signed)
Daily Session Note ? ?Patient Details  ?Name: [name] ?MRN: [id number] ?Date of Birth: May 07, 1940 ?Referring Provider:   ?Flowsheet Row CARDIAC REHAB PHASE II ORIENTATION from 05/19/2021 in [place]  ?Referring Provider Dr. Kipp Brood  ? ?  ? ? ?Encounter Date: 07/05/2021 ? ?Check In: ? Session Check In - 07/05/21 1043   ? ?  ? Check-In  ? Supervising physician immediately available to respond to emergencies [facility] MD immediately available   ? Physician(s) Dr Harl Bowie   ? Location [facility]   ? Staff Present Redge Gainer, BS, Exercise Physiologist;[place] Cassetta Hassell Done, RN, Bjorn Loser, MS, ACSM-CEP, Exercise Physiologist   ? Virtual Visit No   ? Medication changes reported     No   ? Fall or balance concerns reported    No   ? Tobacco Cessation No Change   ? Warm-up and Cool-down Performed as group-led instruction   ? Resistance Training Performed Yes   ? VAD Patient? No   ? PAD/SET Patient? No   ?  ? Pain Assessment  ? Currently in Pain? No/denies   ? Multiple Pain Sites No   ? ?  ?  ? ?  ? ? ?Capillary Blood Glucose: ?No results found for this or any previous visit (from the past 24 hour(s)). ? ? ? ?Social History  ? ?Tobacco Use  ?Smoking Status Never  ?Smokeless Tobacco Never  ? ? ?Goals Met:  ?Independence with exercise equipment ?Exercise tolerated well ?No report of concerns or symptoms today ?Strength training completed today ? ?Goals Unmet:  ?Not Applicable ? ?Comments: Check out at 1200. ? ? ?Dr. Carlyle Dolly is Medical Director for [name] ?

## 2021-07-06 ENCOUNTER — Ambulatory Visit: Payer: PPO | Admitting: Cardiology

## 2021-07-06 ENCOUNTER — Encounter: Payer: Self-pay | Admitting: Cardiology

## 2021-07-06 VITALS — BP 152/62 | HR 41 | Ht 74.0 in | Wt 248.4 lb

## 2021-07-06 DIAGNOSIS — I4819 Other persistent atrial fibrillation: Secondary | ICD-10-CM

## 2021-07-06 DIAGNOSIS — R911 Solitary pulmonary nodule: Secondary | ICD-10-CM

## 2021-07-06 DIAGNOSIS — I25119 Atherosclerotic heart disease of native coronary artery with unspecified angina pectoris: Secondary | ICD-10-CM

## 2021-07-06 DIAGNOSIS — I495 Sick sinus syndrome: Secondary | ICD-10-CM | POA: Diagnosis not present

## 2021-07-06 MED ORDER — METOPROLOL TARTRATE 25 MG PO TABS: 12.5000 mg | ORAL_TABLET | Freq: Two times a day (BID) | ORAL | 0 refills | Status: DC

## 2021-07-06 NOTE — Patient Instructions (Addendum)
Medication Instructions:  ?Your physician has recommended you make the following change in your medication: ] ?Decrease lopressor to 12.5 mg twice a day ?Continue all other medications as directed ? ?Labwork: ?none ? ?Testing/Procedures: ?none ? ?Follow-Up: ?Your physician recommends that you schedule a follow-up appointment in: 4-6 weeks ? ?Any Other Special Instructions Will Be Listed Below (If Applicable). ? ?If you need a refill on your cardiac medications before your next appointment, please call your pharmacy.  ?

## 2021-07-06 NOTE — Progress Notes (Signed)
? ? ?Cardiology Office Note ? ?Date: 07/06/2021  ? ?ID: [name], DOB Mar 07, 1940, MRN [id number] ? ?PCP:  Celene Squibb, MD  ?Cardiologist:  Rozann Lesches, MD ?Electrophysiologist:  None  ? ?Chief Complaint  ?Patient presents with  ? Cardiac follow-up  ? ? ?History of Present Illness: ?[name] is an 81 y.o. male last seen in February.  He is here for a follow-up visit.  Still participating in cardiac rehabilitation.  He complains of fatigue, no dizziness or syncope.  Also postsurgical thoracic soreness and burning.  He had a chest CT in March as noted below.  No obvious acute findings. ? ?I reviewed his medications and we discussed cutting his Lopressor back to 12.5 mg twice daily.  ECG today shows sinus bradycardia at 41 bpm with right bundle branch block and left anterior fascicular block.  He had prior evidence of tachycardia-bradycardia syndrome which we continue to follow.  I reviewed his telemetry strips from cardiac rehabilitation, no tachycardia noted. ? ? ?Past Medical History:  ?Diagnosis Date  ? Arthritis   ? CAD (coronary artery disease)   ? Two-vessel obstructive disease November 2022; CABG with LIMA to LAD and SVG to PDA January 2023  ? Chronic back pain   ? CKD (chronic kidney disease) stage 3, GFR 30-59 ml/min (HCC)   ? Essential hypertension   ? GERD (gastroesophageal reflux disease)   ? Helicobacter pylori gastritis   ? Treated  ? History of kidney stones   ? Hx of adenomatous colonic polyps   ? IBS (irritable bowel syndrome)   ? PAF (paroxysmal atrial fibrillation) ([place])   ? Prostate CA [facility])   ? Seed implants  ? Schatzki's ring   ? Stroke [facility])   ? ? ?Past Surgical History:  ?Procedure Laterality Date  ? APPENDECTOMY  age 89  ? BACK SURGERY  02/08  ? [place]  ? BACK SURGERY  09/24/04  ? lumbar, [facility]  ? BACK SURGERY  09/18/02  ? [place]  ? BILIARY STENT PLACEMENT  01/07/2012  ? Procedure: BILIARY STENT PLACEMENT;  Surgeon: Rogene Houston, MD;  Location: [facility];  Service: Endoscopy;  Laterality:  N/A;  ? BILIARY STENT PLACEMENT  02/21/2012  ? Procedure: BILIARY STENT PLACEMENT;  Surgeon: Rogene Houston, MD;  Location: [facility];  Service: Endoscopy;  Laterality: N/A;  Biliary Stent Replacement  ? CATARACT EXTRACTION W/PHACO  11/22/2010  ? Procedure: CATARACT EXTRACTION PHACO AND INTRAOCULAR LENS PLACEMENT (IOC);  Surgeon: Williams Che;  Location: [facility];  Service: Ophthalmology;  Laterality: Right;  CDE: 6.81  ? CHOLECYSTECTOMY  03/04/05  ? [facility], [place]. Gangrenous cholecystitis complicated by abscess requiring percutaneous drainage. He also had common duct stones requiring ERCP and sphincterotomy.  ? COLONOSCOPY  August 2005  ? Scattered sigmoid diverticulosis, splenic flexure polyp. Hyperplastic  ? COLONOSCOPY  1997  ? 3 cm tubular adenoma and a sending colon  ? COLONOSCOPY  02/01/2011  ? Rourk-friable anal canal, hyperplastic rectal polyp  ? COLONOSCOPY N/A 08/15/2013  ? Procedure: COLONOSCOPY;  Surgeon: Rogene Houston, MD;  Location[facility];  Service: Endoscopy;  Laterality: N/A;  100  ? COLONOSCOPY N/A 09/20/2018  ? Procedure: COLONOSCOPY;  Surgeon: Rogene Houston, MD;  Location[facility];  Service: Endoscopy;  Laterality: N/A;  12:00pm  ? CORONARY ARTERY BYPASS GRAFT N/A 03/09/2021  ? Procedure: OFF PUMP CORONARY ARTERY BYPASS GRAFTING (CABG) X2, USING LEFT INTERNAL MAMMARY ARTERY AND RIGHT LEG GREATER SAPHENOUS VEIN HARVESTED ENDOSCOPICALLY;  Surgeon: Lajuana Matte, MD;  Location: [place];  Service: Open Heart Surgery;  Laterality: N/A;  X 2  ? ERCP  03/02/05  ? [facility], [place]. Normal-appearing biliary tree (gallbladder not image), status post sphincterotomy with recovery of small pieces of stone material, status post balloon occlusion cholangiogram.  ? ERCP  01/07/2012  ? Procedure: ENDOSCOPIC RETROGRADE CHOLANGIOPANCREATOGRAPHY (ERCP);  Surgeon: Rogene Houston, MD;  Location: [facility];  Service: Endoscopy;  Laterality: N/A;  possible biliary stenting  ? ERCP  02/21/2012  ? Procedure:  ENDOSCOPIC RETROGRADE CHOLANGIOPANCREATOGRAPHY (ERCP);  Surgeon: Rogene Houston, MD;  Location: [facility];  Service: Endoscopy;  Laterality: N/A;  common bile duct stone and clip removed  ? ERCP N/A 05/18/2012  ? Procedure: ENDOSCOPIC RETROGRADE CHOLANGIOPANCREATOGRAPHY (ERCP);  Surgeon: Rogene Houston, MD;  Location: [facility];  Service: Endoscopy;  Laterality: N/A;  stone and debri removal  ? ESOPHAGOGASTRODUODENOSCOPY  August 2005  ? Erosive esophagitis and Schatzki ring, small hiatal hernia  ? ESOPHAGOGASTRODUODENOSCOPY  01/2008  ? Mild reflux esophagitis, small hiatal hernia  ? ESOPHAGOGASTRODUODENOSCOPY  02/01/2011  ? Rourk-erosive reflux esophagitis, small hiatal hernia  ? ESOPHAGOGASTRODUODENOSCOPY  10/26/2011  ? Dr. Eli Phillips gastric submucosal petechia (bx-benign ulceration), juxta ampullary duodenal diverticulum and some mucosal edema involving 1st/2nd portion of duodenum with superficial erosions (bx-superficial ulceration/benign)  ? ESOPHAGOGASTRODUODENOSCOPY  02/21/2012  ? Procedure: ESOPHAGOGASTRODUODENOSCOPY (EGD);  Surgeon: Rogene Houston, MD;  Location: [facility];  Service: Endoscopy;  Laterality: N/A;  ? EYE SURGERY    ? Incomplete colonoscopy  December 2009  ? Left-sided diverticula, mid descending colon polyp, due to recurrent looping and redundancy exam was incomplete. It was felt that the mid colon was reached. Followup barium enema showed colon interposition between the liver and the diaphragm, redundant sigmoid colon but no colon mass or polyp identified.. Pathology revealed tubular adenoma.  ? LEFT HEART CATH AND CORONARY ANGIOGRAPHY N/A 01/18/2021  ? Procedure: LEFT HEART CATH AND CORONARY ANGIOGRAPHY;  Surgeon: Lorretta Harp, MD;  Location: [place] [facility];  Service: Cardiovascular;  Laterality: N/A;  ? POLYPECTOMY  09/20/2018  ? Procedure: POLYPECTOMY;  Surgeon: Rogene Houston, MD;  Location[facility];  Service: Endoscopy;;  Hepatic flexure polyp cold snare, splenic flexure  polyp  ? SPHINCTEROTOMY  01/07/2012  ? Procedure: SPHINCTEROTOMY;  Surgeon: Rogene Houston, MD;  Location: [facility];  Service: Endoscopy;  Laterality: N/A;  Extended  ? SPYGLASS CHOLANGIOSCOPY  02/21/2012  ? Procedure: SPYGLASS CHOLANGIOSCOPY;  Surgeon: Rogene Houston, MD;  Location: [facility];  Service: Endoscopy;  Laterality: N/A;  ? SPYGLASS CHOLANGIOSCOPY N/A 05/18/2012  ? Procedure: SPYGLASS CHOLANGIOSCOPY;  Surgeon: Rogene Houston, MD;  Location: [facility];  Service: Endoscopy;  Laterality: N/A;  ? TEE WITHOUT CARDIOVERSION N/A 03/09/2021  ? Procedure: TRANSESOPHAGEAL ECHOCARDIOGRAM (TEE);  Surgeon: Lajuana Matte, MD;  Location: [place];  Service: Open Heart Surgery;  Laterality: N/A;  ? TOTAL KNEE ARTHROPLASTY  03/21/2012  ? Procedure: TOTAL KNEE ARTHROPLASTY;  Surgeon: [name], MD;  Location: [place];  Service: Orthopedics;  Laterality: Right;  right knee arthroplasty  ? TOTAL KNEE ARTHROPLASTY Left 04/08/2019  ? Procedure: LEFT TOTAL KNEE ARTHROPLASTY;  Surgeon: Frederik Pear, MD;  Location: [facility];  Service: Orthopedics;  Laterality: Left;  ? ? ?Current Outpatient Medications  ?Medication Sig Dispense Refill  ? apixaban (ELIQUIS) 2.5 MG TABS tablet Take 1 tablet (2.5 mg total) by mouth 2 (two) times daily. 60 tablet 3  ? aspirin EC  81 MG tablet Take 81 mg by mouth in the morning. Swallow whole.    ? atorvastatin (LIPITOR) 20 MG tablet Take 1 tablet (20 mg total) by mouth daily. (Patient taking differently: Take 20 mg by mouth every evening.) 90 tablet 3  ? metoprolol tartrate (LOPRESSOR) 25 MG tablet Take 0.5 tablets (12.5 mg total) by mouth 2 (two) times daily. 90 tablet 0  ? ?No current facility-administered medications for this visit.  ? ?Allergies:  Patient has no known allergies.  ? ?ROS: No syncope. ? ?Physical Exam: ?VS:  BP (!) 152/62   Pulse (!) 41   Ht '6\' 2"'$  (1.88 m)   Wt 248 lb 6.4 oz (112.7 kg)   SpO2 95%   BMI 31.89 kg/m? , BMI Body mass index is 31.89 kg/m?. ? ?Wt Readings from Last 3  Encounters:  ?07/06/21 248 lb 6.4 oz (112.7 kg)  ?06/28/21 247 lb 12.8 oz (112.4 kg)  ?06/14/21 250 lb 3.6 oz (113.5 kg)  ?  ?General: Patient appears comfortable at rest. ?HEENT: Conjunctiva and lids normal. ?Neck: Su

## 2021-07-07 ENCOUNTER — Encounter (HOSPITAL_COMMUNITY)
Admission: RE | Admit: 2021-07-07 | Discharge: 2021-07-07 | Disposition: A | Discharge status: Home / Self Care | Payer: PPO | Source: Ambulatory Visit | Attending: Cardiology | Admitting: Cardiology

## 2021-07-07 DIAGNOSIS — Z951 Presence of aortocoronary bypass graft: Secondary | ICD-10-CM | POA: Diagnosis not present

## 2021-07-07 NOTE — Progress Notes (Signed)
Daily Session Note ? ?Patient Details  ?Name: [name] ?MRN: [id number] ?Date of Birth: [birth year] ?Referring Provider:   ?Flowsheet Row CARDIAC REHAB PHASE II ORIENTATION from 05/19/2021 in [place]  ?Referring Provider Dr. Kipp Brood  ? ?  ? ? ?Encounter Date: 07/07/2021 ? ?Check In: ? Session Check In - 07/07/21 1059   ? ?  ? Check-In  ? Supervising physician immediately available to respond to emergencies [facility] MD immediately available   ? Physician(s) Dr Harl Bowie   ? Location [facility]   ? Staff Present Redge Gainer, BS, Exercise Physiologist;Dalton Kris Mouton, MS, ACSM-CEP, Exercise Physiologist;Torien Ramroop, RN;Debra Wynetta Emery, RN, BSN   ? Virtual Visit No   ? Medication changes reported     No   ? Fall or balance concerns reported    No   ? Tobacco Cessation No Change   ? Warm-up and Cool-down Performed as group-led instruction   ? Resistance Training Performed Yes   ? VAD Patient? No   ? PAD/SET Patient? No   ?  ? Pain Assessment  ? Currently in Pain? No/denies   ? Multiple Pain Sites No   ? ?  ?  ? ?  ? ? ?Capillary Blood Glucose: ?No results found for this or any previous visit (from the past 24 hour(s)). ? ? ? ?Social History  ? ?Tobacco Use  ?Smoking Status Never  ?Smokeless Tobacco Never  ? ? ?Goals Met:  ?Independence with exercise equipment ?Exercise tolerated well ?No report of concerns or symptoms today ?Strength training completed today ? ?Goals Unmet:  ?Not Applicable ? ?Comments: check out @ 12:00 ? ? ?Dr. Carlyle Dolly is Medical Director for [place] ?

## 2021-07-09 ENCOUNTER — Encounter (HOSPITAL_COMMUNITY)
Admission: RE | Admit: 2021-07-09 | Discharge: 2021-07-09 | Disposition: A | Discharge status: Home / Self Care | Payer: PPO | Source: Ambulatory Visit | Attending: Cardiology | Admitting: Cardiology

## 2021-07-09 ENCOUNTER — Other Ambulatory Visit: Payer: Self-pay | Admitting: Physician Assistant

## 2021-07-09 ENCOUNTER — Other Ambulatory Visit: Payer: Self-pay | Admitting: Cardiology

## 2021-07-09 DIAGNOSIS — Z951 Presence of aortocoronary bypass graft: Secondary | ICD-10-CM | POA: Diagnosis not present

## 2021-07-09 DIAGNOSIS — I4819 Other persistent atrial fibrillation: Secondary | ICD-10-CM

## 2021-07-09 NOTE — Progress Notes (Signed)
Daily Session Note  Patient Details  Name: [name] MRN: [id number] Date of Birth: 07/04/1940 Referring Provider:   Flowsheet Row CARDIAC REHAB PHASE II ORIENTATION from 05/19/2021 in [place] [facility]  Referring Provider Dr. Lightfoot       Encounter Date: 07/09/2021  Check In:  Session Check In - 07/09/21 1054       Check-In   Supervising physician immediately available to respond to emergencies [facility] MD immediately available    Physician(s) Dr Chandrasekher    Location [facility]    Staff Present Heather Jachimiak, BS, Exercise Physiologist;Phyllis Billingsley, RN; , RN, BSN    Virtual Visit No    Medication changes reported     Yes    Comments Decreased Metoporol 12.5mg    Fall or balance concerns reported    No    Tobacco Cessation No Change    Resistance Training Performed Yes    VAD Patient? No    PAD/SET Patient? No      Pain Assessment   Currently in Pain? No/denies    Multiple Pain Sites No             Capillary Blood Glucose: No results found for this or any previous visit (from the past 24 hour(s)).    Social History   Tobacco Use  Smoking Status Never  Smokeless Tobacco Never    Goals Met:  Independence with exercise equipment Exercise tolerated well No report of concerns or symptoms today Strength training completed today  Goals Unmet:  Not Applicable  Comments: check out at 1200.   Dr. Jonathan Branch is Medical Director for [place] [facility] 

## 2021-07-09 NOTE — Telephone Encounter (Signed)
Prescription refill request for Eliquis received. Indication: Afib  Last office visit:07/06/21 Domenic Polite)  Scr: 1.65 (03/15/21) Age: 81 Weight: 112.7kg  Appropriate dose and refill sent to requested pharmacy.

## 2021-07-12 ENCOUNTER — Encounter (HOSPITAL_COMMUNITY)
Admission: RE | Admit: 2021-07-12 | Discharge: 2021-07-12 | Disposition: A | Discharge status: Home / Self Care | Payer: PPO | Source: Ambulatory Visit | Attending: Cardiology | Admitting: Cardiology

## 2021-07-12 VITALS — Wt 245.2 lb

## 2021-07-12 DIAGNOSIS — Z951 Presence of aortocoronary bypass graft: Secondary | ICD-10-CM

## 2021-07-12 NOTE — Progress Notes (Signed)
Daily Session Note  Patient Details  Name: [name] MRN: [id number] Date of Birth: 1940-05-17 Referring Provider:   Flowsheet Row CARDIAC REHAB PHASE II ORIENTATION from 05/19/2021 in [employer]  Referring Provider Dr. Kipp Brood       Encounter Date: 07/12/2021  Check In:  Session Check In - 07/12/21 1054       Check-In   Supervising physician immediately available to respond to emergencies [facility] MD immediately available    Physician(s) Dr Gardiner Rhyme    Location [facility]    Staff Present Redge Gainer, BS, Exercise Physiologist;Phyllis Billingsley, RN;Treylin Burtch Hassell Done, RN, Jennye Moccasin, RN, BSN    Virtual Visit No    Medication changes reported     Yes    Comments Decreased Metoporol 12.68m    Tobacco Cessation No Change    Warm-up and Cool-down Performed as group-led instruction    Resistance Training Performed Yes    VAD Patient? No    PAD/SET Patient? No      Pain Assessment   Currently in Pain? No/denies    Multiple Pain Sites No             Capillary Blood Glucose: No results found for this or any previous visit (from the past 24 hour(s)).    Social History   Tobacco Use  Smoking Status Never  Smokeless Tobacco Never    Goals Met:  Independence with exercise equipment Exercise tolerated well No report of concerns or symptoms today Strength training completed today  Goals Unmet:  Not Applicable  Comments: Check out at 1200.   Dr. JCarlyle Dollyis Medical Director for [facility]

## 2021-07-14 ENCOUNTER — Encounter (HOSPITAL_COMMUNITY)
Admission: RE | Admit: 2021-07-14 | Discharge: 2021-07-14 | Disposition: A | Discharge status: Home / Self Care | Payer: PPO | Source: Ambulatory Visit | Attending: Cardiology | Admitting: Cardiology

## 2021-07-14 DIAGNOSIS — Z951 Presence of aortocoronary bypass graft: Secondary | ICD-10-CM | POA: Diagnosis not present

## 2021-07-14 NOTE — Progress Notes (Signed)
Cardiac Individual Treatment Plan  Patient Details  Name: OLAJUWON FOSDICK MRN: [id number] Date of Birth: [birth year] Referring Provider:   Flowsheet Row CARDIAC REHAB PHASE II ORIENTATION from 05/19/2021 in [place]  Referring Provider Dr. Kipp Brood       Initial Encounter Date:  Flowsheet Row CARDIAC REHAB PHASE II ORIENTATION from 05/19/2021 in [place]  Date 05/19/21       Visit Diagnosis: S/P CABG x 2  Patient's Home Medications on Admission:  Current Outpatient Medications:    apixaban (ELIQUIS) 2.5 MG TABS tablet, TAKE ONE TABLET (2.'5MG'$  TOTAL) BY MOUTH TWO TIMES DAILY, Disp: 60 tablet, Rfl: 5   aspirin EC 81 MG tablet, Take 81 mg by mouth in the morning. Swallow whole., Disp: , Rfl:    atorvastatin (LIPITOR) 20 MG tablet, Take 1 tablet (20 mg total) by mouth daily. (Patient taking differently: Take 20 mg by mouth every evening.), Disp: 90 tablet, Rfl: 3   metoprolol tartrate (LOPRESSOR) 25 MG tablet, Take 0.5 tablets (12.5 mg total) by mouth 2 (two) times daily., Disp: 90 tablet, Rfl: 0  Past Medical History: Past Medical History:  Diagnosis Date   Arthritis    CAD (coronary artery disease)    Two-vessel obstructive disease November 2022; CABG with LIMA to LAD and SVG to PDA January 2023   Chronic back pain    CKD (chronic kidney disease) stage 3, GFR 30-59 ml/min (HCC)    Essential hypertension    GERD (gastroesophageal reflux disease)    Helicobacter pylori gastritis    Treated   History of kidney stones    Hx of adenomatous colonic polyps    IBS (irritable bowel syndrome)    PAF (paroxysmal atrial fibrillation) (HCC)    Prostate CA (HCC)    Seed implants   Schatzki's ring    Stroke ([place])     Tobacco Use: Social History   Tobacco Use  Smoking Status Never  Smokeless Tobacco Never    Labs: Review Flowsheet        Latest Ref Rng & Units 10/24/2011 11/10/2015 03/05/2021 03/09/2021  Labs for ITP Cardiac and  Pulmonary Rehab  Cholestrol 125 - 200 mg/dL  200      LDL (calc) <130 mg/dL  124      HDL-C >=40 mg/dL  34      Trlycerides <150 mg/dL  208      Hemoglobin A1c 4.8 - 5.6 %   5.7     PH, Arterial 7.350 - 7.450   7.379   7.314     7.266     7.369     7.409    PCO2 arterial 32.0 - 48.0 mmHg   43.9   41.5     43.6     36.4     36.8    Bicarbonate 20.0 - 28.0 mmol/L   25.3   20.8     19.6     20.9     23.8    TCO2 22 - 32 mmol/L '28     22     21     22     25     25     23     26    '$ Acid-base deficit 0.0 - 2.0 mmol/L    5.0     7.0     4.0     1.0    O2 Saturation %   93.7   94.0  89.0     96.0     97.0        Multiple values from one day are sorted in reverse-chronological order        Capillary Blood Glucose: Lab Results  Component Value Date   GLUCAP 111 (H) 03/14/2021   GLUCAP 115 (H) 03/12/2021   GLUCAP 119 (H) 03/12/2021   GLUCAP 133 (H) 03/12/2021   GLUCAP 138 (H) 03/11/2021     Exercise Target Goals: Exercise Program Goal: Individual exercise prescription set using results from initial 6 min walk test and THRR while considering  patient's activity barriers and safety.   Exercise Prescription Goal: Starting with aerobic activity 30 plus minutes a day, 3 days per week for initial exercise prescription. Provide home exercise prescription and guidelines that participant acknowledges understanding prior to discharge.  Activity Barriers & Risk Stratification:  Activity Barriers & Cardiac Risk Stratification - 05/19/21 1300       Activity Barriers & Cardiac Risk Stratification   Activity Barriers Arthritis;Back Problems;Left Knee Replacement;Right Knee Replacement;Neck/Spine Problems;Shortness of Breath    Cardiac Risk Stratification High             6 Minute Walk:  6 Minute Walk     Row Name 05/19/21 1358         6 Minute Walk   Phase Initial     Distance 1200 feet     Walk Time 6 minutes     # of Rest Breaks 0     MPH 2.27     METS  1.75     RPE 12     VO2 Peak 6.12     Symptoms No     Resting HR 47 bpm     Resting BP 128/56     Resting Oxygen Saturation  96 %     Exercise Oxygen Saturation  during 6 min walk 97 %     Max Ex. HR 75 bpm     Max Ex. BP 136/60     2 Minute Post BP 126/60              Oxygen Initial Assessment:   Oxygen Re-Evaluation:   Oxygen Discharge (Final Oxygen Re-Evaluation):   Initial Exercise Prescription:  Initial Exercise Prescription - 05/19/21 1400       Date of Initial Exercise RX and Referring Provider   Date 05/19/21    Referring Provider Dr. Kipp Brood    Expected Discharge Date 08/13/21      Treadmill   MPH 1.3    Grade 2    Minutes 17      Recumbant Elliptical   Level 1    RPM 60    Minutes 22      Prescription Details   Frequency (times per week) 3    Duration Progress to 30 minutes of continuous aerobic without signs/symptoms of physical distress      Intensity   THRR 40-80% of Max Heartrate 56-111    Ratings of Perceived Exertion 11-13      Resistance Training   Training Prescription Yes    Weight 3    Reps 10-15             Perform Capillary Blood Glucose checks as needed.  Exercise Prescription Changes:   Exercise Prescription Changes     Row Name 05/31/21 1200 06/04/21 1100 06/14/21 1200 06/28/21 1200 07/12/21 1400     Response to Exercise   Blood Pressure (Admit) 150/58 -- 140/60 130/60 152/62  Blood Pressure (Exercise) 156/76 -- 178/62 180/70 190/68   Blood Pressure (Exit) 108/60 -- 142/64 158/60 126/66   Heart Rate (Admit) 46 bpm -- 48 bpm 41 bpm 49 bpm   Heart Rate (Exercise) 89 bpm -- 89 bpm 88 bpm 90 bpm   Heart Rate (Exit) 55 bpm -- 57 bpm 50 bpm 59 bpm   Rating of Perceived Exertion (Exercise) 10 -- '10 10 12   '$ Duration Continue with 30 min of aerobic exercise without signs/symptoms of physical distress. -- Continue with 30 min of aerobic exercise without signs/symptoms of physical distress. Continue with 30 min of  aerobic exercise without signs/symptoms of physical distress. Continue with 30 min of aerobic exercise without signs/symptoms of physical distress.   Intensity THRR unchanged -- THRR unchanged THRR unchanged THRR unchanged     Progression   Progression Continue to progress workloads to maintain intensity without signs/symptoms of physical distress. -- Continue to progress workloads to maintain intensity without signs/symptoms of physical distress. Continue to progress workloads to maintain intensity without signs/symptoms of physical distress. Continue to progress workloads to maintain intensity without signs/symptoms of physical distress.     Resistance Training   Training Prescription Yes -- Yes Yes Yes   Weight 4 -- '5 5 5   '$ Reps 10-15 -- 10-15 10-15 10-15   Time 10 Minutes -- 10 Minutes 10 Minutes 10 Minutes     Treadmill   MPH 2.1 -- 2.5 2.5 2.5   Grade 0 -- 0 0 1.5   Minutes 17 -- '17 17 17   '$ METs 2.61 -- 2.91 3.6 3.43     Recumbant Elliptical   Level 2 -- '2 4 1   '$ RPM 51 -- 66 56 60   Minutes 22 -- '22 22 22   '$ METs 2.4 -- 3.2 3 3.2     Home Exercise Plan   Plans to continue exercise at -- Home (comment) -- -- --   Frequency -- Add 2 additional days to program exercise sessions. -- -- --   Initial Home Exercises Provided -- 06/04/21 -- -- --            Exercise Comments:   Exercise Comments     Row Name 06/04/21 1137           Exercise Comments home exercise reviewed                Exercise Goals and Review:   Exercise Goals     Row Name 05/19/21 1402 06/14/21 1234 07/12/21 1416         Exercise Goals   Increase Physical Activity Yes Yes Yes     Intervention Provide advice, education, support and counseling about physical activity/exercise needs.;Develop an individualized exercise prescription for aerobic and resistive training based on initial evaluation findings, risk stratification, comorbidities and participant's personal goals. Provide advice,  education, support and counseling about physical activity/exercise needs.;Develop an individualized exercise prescription for aerobic and resistive training based on initial evaluation findings, risk stratification, comorbidities and participant's personal goals. Provide advice, education, support and counseling about physical activity/exercise needs.;Develop an individualized exercise prescription for aerobic and resistive training based on initial evaluation findings, risk stratification, comorbidities and participant's personal goals.     Expected Outcomes Short Term: Attend rehab on a regular basis to increase amount of physical activity.;Long Term: Add in home exercise to make exercise part of routine and to increase amount of physical activity.;Long Term: Exercising regularly at least 3-5 days a week. Short Term: Attend  rehab on a regular basis to increase amount of physical activity.;Long Term: Add in home exercise to make exercise part of routine and to increase amount of physical activity.;Long Term: Exercising regularly at least 3-5 days a week. Short Term: Attend rehab on a regular basis to increase amount of physical activity.;Long Term: Add in home exercise to make exercise part of routine and to increase amount of physical activity.;Long Term: Exercising regularly at least 3-5 days a week.     Increase Strength and Stamina Yes Yes Yes     Intervention Provide advice, education, support and counseling about physical activity/exercise needs.;Develop an individualized exercise prescription for aerobic and resistive training based on initial evaluation findings, risk stratification, comorbidities and participant's personal goals. Provide advice, education, support and counseling about physical activity/exercise needs.;Develop an individualized exercise prescription for aerobic and resistive training based on initial evaluation findings, risk stratification, comorbidities and participant's personal  goals. Provide advice, education, support and counseling about physical activity/exercise needs.;Develop an individualized exercise prescription for aerobic and resistive training based on initial evaluation findings, risk stratification, comorbidities and participant's personal goals.     Expected Outcomes Short Term: Increase workloads from initial exercise prescription for resistance, speed, and METs.;Short Term: Perform resistance training exercises routinely during rehab and add in resistance training at home;Long Term: Improve cardiorespiratory fitness, muscular endurance and strength as measured by increased METs and functional capacity (6MWT) Short Term: Increase workloads from initial exercise prescription for resistance, speed, and METs.;Short Term: Perform resistance training exercises routinely during rehab and add in resistance training at home;Long Term: Improve cardiorespiratory fitness, muscular endurance and strength as measured by increased METs and functional capacity (6MWT) Short Term: Increase workloads from initial exercise prescription for resistance, speed, and METs.;Short Term: Perform resistance training exercises routinely during rehab and add in resistance training at home;Long Term: Improve cardiorespiratory fitness, muscular endurance and strength as measured by increased METs and functional capacity (6MWT)     Able to understand and use rate of perceived exertion (RPE) scale Yes Yes Yes     Intervention Provide education and explanation on how to use RPE scale Provide education and explanation on how to use RPE scale Provide education and explanation on how to use RPE scale     Expected Outcomes Short Term: Able to use RPE daily in rehab to express subjective intensity level;Long Term:  Able to use RPE to guide intensity level when exercising independently Short Term: Able to use RPE daily in rehab to express subjective intensity level;Long Term:  Able to use RPE to guide intensity  level when exercising independently Short Term: Able to use RPE daily in rehab to express subjective intensity level;Long Term:  Able to use RPE to guide intensity level when exercising independently     Knowledge and understanding of Target Heart Rate Range (THRR) Yes Yes Yes     Intervention Provide education and explanation of THRR including how the numbers were predicted and where they are located for reference Provide education and explanation of THRR including how the numbers were predicted and where they are located for reference Provide education and explanation of THRR including how the numbers were predicted and where they are located for reference     Expected Outcomes Short Term: Able to state/look up THRR;Long Term: Able to use THRR to govern intensity when exercising independently;Short Term: Able to use daily as guideline for intensity in rehab Short Term: Able to state/look up THRR;Long Term: Able to use THRR to govern intensity when  exercising independently;Short Term: Able to use daily as guideline for intensity in rehab Short Term: Able to state/look up THRR;Long Term: Able to use THRR to govern intensity when exercising independently;Short Term: Able to use daily as guideline for intensity in rehab     Able to check pulse independently Yes Yes Yes     Intervention Provide education and demonstration on how to check pulse in carotid and radial arteries.;Review the importance of being able to check your own pulse for safety during independent exercise Provide education and demonstration on how to check pulse in carotid and radial arteries.;Review the importance of being able to check your own pulse for safety during independent exercise Provide education and demonstration on how to check pulse in carotid and radial arteries.;Review the importance of being able to check your own pulse for safety during independent exercise     Expected Outcomes Short Term: Able to explain why pulse checking is  important during independent exercise;Long Term: Able to check pulse independently and accurately Short Term: Able to explain why pulse checking is important during independent exercise;Long Term: Able to check pulse independently and accurately Short Term: Able to explain why pulse checking is important during independent exercise;Long Term: Able to check pulse independently and accurately     Understanding of Exercise Prescription Yes Yes Yes     Intervention Provide education, explanation, and written materials on patient's individual exercise prescription Provide education, explanation, and written materials on patient's individual exercise prescription Provide education, explanation, and written materials on patient's individual exercise prescription     Expected Outcomes Short Term: Able to explain program exercise prescription;Long Term: Able to explain home exercise prescription to exercise independently Short Term: Able to explain program exercise prescription;Long Term: Able to explain home exercise prescription to exercise independently Short Term: Able to explain program exercise prescription;Long Term: Able to explain home exercise prescription to exercise independently              Exercise Goals Re-Evaluation :  Exercise Goals Re-Evaluation     Row Name 06/14/21 1234 07/12/21 1417           Exercise Goal Re-Evaluation   Exercise Goals Review Increase Strength and Stamina;Increase Physical Activity;Able to understand and use rate of perceived exertion (RPE) scale;Knowledge and understanding of Target Heart Rate Range (THRR);Able to check pulse independently;Understanding of Exercise Prescription Increase Physical Activity;Increase Strength and Stamina;Able to understand and use rate of perceived exertion (RPE) scale;Knowledge and understanding of Target Heart Rate Range (THRR);Able to check pulse independently;Understanding of Exercise Prescription      Comments Pt has completed 11  sessions of cardiac rehab. He comes to class motviated and is progressing his workload. He is currently exercising at 3.2 METs on the ellp. Will continue to monitor and progress as able. Pt has completed 22 session of cardiac rehab. He continues to be motivated when in class and progressing his workload. He is having high BP and bradycardia when in class and recently had his medication changed at his follow up appointment. He is currenlty exercising at 3.43 METs on the treadmill. Will continue to monitor and progress as able.      Expected Outcomes Through exercise at home and at rehab, the patient will meet their stated goals. Through exercise at home and at rehab, the patient will meet their stated goals.                Discharge Exercise Prescription (Final Exercise Prescription Changes):  Exercise Prescription Changes -  07/12/21 1400       Response to Exercise   Blood Pressure (Admit) 152/62    Blood Pressure (Exercise) 190/68    Blood Pressure (Exit) 126/66    Heart Rate (Admit) 49 bpm    Heart Rate (Exercise) 90 bpm    Heart Rate (Exit) 59 bpm    Rating of Perceived Exertion (Exercise) 12    Duration Continue with 30 min of aerobic exercise without signs/symptoms of physical distress.    Intensity THRR unchanged      Progression   Progression Continue to progress workloads to maintain intensity without signs/symptoms of physical distress.      Resistance Training   Training Prescription Yes    Weight 5    Reps 10-15    Time 10 Minutes      Treadmill   MPH 2.5    Grade 1.5    Minutes 17    METs 3.43      Recumbant Elliptical   Level 1    RPM 60    Minutes 22    METs 3.2             Nutrition:  Target Goals: Understanding of nutrition guidelines, daily intake of sodium '1500mg'$ , cholesterol '200mg'$ , calories 30% from fat and 7% or less from saturated fats, daily to have 5 or more servings of fruits and vegetables.  Biometrics:  Pre Biometrics - 05/19/21 1403        Pre Biometrics   Height '6\' 2"'$  (1.88 m)    Weight 110.5 kg    Waist Circumference 44 inches    Hip Circumference 40 inches    Waist to Hip Ratio 1.1 %    BMI (Calculated) 31.26    Triceps Skinfold 14 mm    % Body Fat 30.2 %    Grip Strength 27.4 kg    Single Leg Stand 1 seconds              Nutrition Therapy Plan and Nutrition Goals:  Nutrition Therapy & Goals - 06/07/21 0743       Personal Nutrition Goals   Comments Patient scored 89 on his diet assessment. We offer 2 educational session on heart healthy nutrition with handouts and assistance with RD referral if patient is interested.      Intervention Plan   Intervention Nutrition handout(s) given to patient.    Expected Outcomes Short Term Goal: Understand basic principles of dietary content, such as calories, fat, sodium, cholesterol and nutrients.             Nutrition Assessments:  Nutrition Assessments - 05/19/21 1303       MEDFICTS Scores   Pre Score 89            MEDIFICTS Score Key: ?70 Need to make dietary changes  40-70 Heart Healthy Diet ? 40 Therapeutic Level Cholesterol Diet   Picture Your Plate Scores: <40 Unhealthy dietary pattern with much room for improvement. 41-50 Dietary pattern unlikely to meet recommendations for good health and room for improvement. 51-60 More healthful dietary pattern, with some room for improvement.  >60 Healthy dietary pattern, although there may be some specific behaviors that could be improved.    Nutrition Goals Re-Evaluation:   Nutrition Goals Discharge (Final Nutrition Goals Re-Evaluation):   Psychosocial: Target Goals: Acknowledge presence or absence of significant depression and/or stress, maximize coping skills, provide positive support system. Participant is able to verbalize types and ability to use techniques and skills needed for reducing stress and  depression.  Initial Review & Psychosocial Screening:  Initial Psych Review &  Screening - 05/19/21 1300       Initial Review   Current issues with None Identified      Family Dynamics   Good Support System? Yes    Comments His wife and his son are his main support group.      Barriers   Psychosocial barriers to participate in program There are no identifiable barriers or psychosocial needs.      Screening Interventions   Interventions Encouraged to exercise    Expected Outcomes Long Term goal: The participant improves quality of Life and PHQ9 Scores as seen by post scores and/or verbalization of changes;Short Term goal: Identification and review with participant of any Quality of Life or Depression concerns found by scoring the questionnaire.             Quality of Life Scores:  Quality of Life - 05/19/21 1403       Quality of Life   Select Quality of Life      Quality of Life Scores   Health/Function Pre 25.11 %    Socioeconomic Pre 26.75 %    Psych/Spiritual Pre 28.58 %    Family Pre 30 %    GLOBAL Pre 26.89 %            Scores of 19 and below usually indicate a poorer quality of life in these areas.  A difference of  2-3 points is a clinically meaningful difference.  A difference of 2-3 points in the total score of the Quality of Life Index has been associated with significant improvement in overall quality of life, self-image, physical symptoms, and general health in studies assessing change in quality of life.  PHQ-9: Review Flowsheet        05/19/2021 11/26/2014  Depression screen PHQ 2/9  Decreased Interest 0 0  Down, Depressed, Hopeless 0 0  PHQ - 2 Score 0 0  Altered sleeping 0   Tired, decreased energy 1   Change in appetite 0   Feeling bad or failure about yourself  0   Trouble concentrating 0   Moving slowly or fidgety/restless 0   Suicidal thoughts 0   PHQ-9 Score 1   Difficult doing work/chores Not difficult at all          Interpretation of Total Score  Total Score Depression Severity:  1-4 = Minimal depression,  5-9 = Mild depression, 10-14 = Moderate depression, 15-19 = Moderately severe depression, 20-27 = Severe depression   Psychosocial Evaluation and Intervention:  Psychosocial Evaluation - 05/19/21 1342       Psychosocial Evaluation & Interventions   Interventions Stress management education;Relaxation education;Encouraged to exercise with the program and follow exercise prescription    Comments Pt has no barriers to participating in CR. He has no identifiable psychosocial issues. He has had to make several lifestyle adjustments since his CABG. He reports that he has always been able to do whatever he has wanted physically. Now he is limited while allowing his sternum to heal and he has been experiencing DOE since his surgery. He scored a 1 on his PHQ-9 and he attributes this to his lack of energy that he feels some days. His energy levels have not returned to their pre CABG levels. He scored an 57 on his MEDFICTs and is interested in making dietary changes. He reports that his wife cooks "old school", and they have never tried to cook heart healthy. He is  interested in making substitutions in his diet. He reports that his son died unexpectedly about 2 years ago, and he had a daughter die 10 years ago as well. He reports that he has always been able to cope well, and that he has a good support system with his wife, son, and grand children. His goals while in the program are to lose weight and to decrease his SOB with exertion. He is eager to start the program and to get back to his former physical fitness levels.    Expected Outcomes Pt will continue to have no identifiable psychosocial issues.    Continue Psychosocial Services  No Follow up required             Psychosocial Re-Evaluation:  Psychosocial Re-Evaluation     [place] Name 06/07/21 0749 07/05/21 0805           Psychosocial Re-Evaluation   Current issues with None Identified None Identified      Comments Patient is new to the program  completing 7 sessions. He continues to have no psychosocial barriers or issues identified. He demonstrates an interest in improving his health. We will continue to monitor. Patient has completed 18 sessions. He continues to have no psychosocial barriers or issues identified. He continues to demonstrate an interest in improving his health and seems to enjoy coming to the sessions. We will continue to monitor.      Expected Outcomes Patient will continue to have no psychosocial barriers identified. Patient will continue to have no psychosocial barriers identified.      Interventions Relaxation education;Encouraged to attend Cardiac Rehabilitation for the exercise;Stress management education Relaxation education;Encouraged to attend Cardiac Rehabilitation for the exercise;Stress management education      Continue Psychosocial Services  No Follow up required No Follow up required               Psychosocial Discharge (Final Psychosocial Re-Evaluation):  Psychosocial Re-Evaluation - 07/05/21 0805       Psychosocial Re-Evaluation   Current issues with None Identified    Comments Patient has completed 18 sessions. He continues to have no psychosocial barriers or issues identified. He continues to demonstrate an interest in improving his health and seems to enjoy coming to the sessions. We will continue to monitor.    Expected Outcomes Patient will continue to have no psychosocial barriers identified.    Interventions Relaxation education;Encouraged to attend Cardiac Rehabilitation for the exercise;Stress management education    Continue Psychosocial Services  No Follow up required             Vocational Rehabilitation: Provide vocational rehab assistance to qualifying candidates.   Vocational Rehab Evaluation & Intervention:  Vocational Rehab - 05/19/21 1315       Initial Vocational Rehab Evaluation & Intervention   Assessment shows need for Vocational Rehabilitation No   He is retired  and not interested in returning to work.            Education: Education Goals: Education classes will be provided on a weekly basis, covering required topics. Participant will state understanding/return demonstration of topics presented.  Learning Barriers/Preferences:  Learning Barriers/Preferences - 05/19/21 1308       Learning Barriers/Preferences   Learning Barriers None    Learning Preferences Skilled Demonstration;Written Material             Education Topics: Hypertension, Hypertension Reduction -Define heart disease and high blood pressure. Discus how high blood pressure affects the body and ways to reduce high  blood pressure. Flowsheet Row CARDIAC REHAB PHASE II EXERCISE from 07/07/2021 in [place]  Date 05/26/21  Educator DM  Instruction Review Code 1- Verbalizes Understanding       Exercise and Your Heart -Discuss why it is important to exercise, the FITT principles of exercise, normal and abnormal responses to exercise, and how to exercise safely. Flowsheet Row CARDIAC REHAB PHASE II EXERCISE from 07/07/2021 in [place]  Date 06/02/21  Educator Epps  Instruction Review Code 1- Verbalizes Understanding       Angina -Discuss definition of angina, causes of angina, treatment of angina, and how to decrease risk of having angina. Flowsheet Row CARDIAC REHAB PHASE II EXERCISE from 07/07/2021 in [place]  Date 06/09/21  Educator DM  Instruction Review Code 1- Verbalizes Understanding       Cardiac Medications -Review what the following cardiac medications are used for, how they affect the body, and side effects that may occur when taking the medications.  Medications include Aspirin, Beta blockers, calcium channel blockers, ACE Inhibitors, angiotensin receptor blockers, diuretics, digoxin, and antihyperlipidemics. Flowsheet Row CARDIAC REHAB PHASE II EXERCISE from 07/07/2021 in [place]  Date 06/16/21  Educator DF  Instruction Review Code 2- Demonstrated Understanding       Congestive Heart Failure -Discuss the definition of CHF, how to live with CHF, the signs and symptoms of CHF, and how keep track of weight and sodium intake. Flowsheet Row CARDIAC REHAB PHASE II EXERCISE from 07/07/2021 in [place]  Date 06/23/21  Educator hj  Instruction Review Code 2- Demonstrated Understanding       Heart Disease and Intimacy -Discus the effect sexual activity has on the heart, how changes occur during intimacy as we age, and safety during sexual activity. Flowsheet Row CARDIAC REHAB PHASE II EXERCISE from 07/07/2021 in [place]  Date 06/30/21  Educator pb  Instruction Review Code 1- Verbalizes Understanding       Smoking Cessation / COPD -Discuss different methods to quit smoking, the health benefits of quitting smoking, and the definition of COPD. Flowsheet Row CARDIAC REHAB PHASE II EXERCISE from 07/07/2021 in [place]  Date 07/07/21  Educator pb  Instruction Review Code 1- Verbalizes Understanding       Nutrition I: Fats -Discuss the types of cholesterol, what cholesterol does to the heart, and how cholesterol levels can be controlled.   Nutrition II: Labels -Discuss the different components of food labels and how to read food label   Heart Parts/Heart Disease and PAD -Discuss the anatomy of the heart, the pathway of blood circulation through the heart, and these are affected by heart disease.   Stress I: Signs and Symptoms -Discuss the causes of stress, how stress may lead to anxiety and depression, and ways to limit stress.   Stress II: Relaxation -Discuss different types of relaxation techniques to limit stress.   Warning Signs of Stroke / TIA -Discuss definition of a stroke, what the signs and symptoms are of a stroke, and how to identify when  someone is having stroke.   Knowledge Questionnaire Score:  Knowledge Questionnaire Score - 05/19/21 1309       Knowledge Questionnaire Score   Pre Score 15/24             Core Components/Risk Factors/Patient Goals at Admission:  Personal Goals and Risk Factors at Admission - 05/19/21 1317       Core Components/Risk  Factors/Patient Goals on Admission    Weight Management Yes;Obesity;Weight Loss    Intervention Weight Management: Develop a combined nutrition and exercise program designed to reach desired caloric intake, while maintaining appropriate intake of nutrient and fiber, sodium and fats, and appropriate energy expenditure required for the weight goal.;Weight Management: Provide education and appropriate resources to help participant work on and attain dietary goals.;Weight Management/Obesity: Establish reasonable short term and long term weight goals.;Obesity: Provide education and appropriate resources to help participant work on and attain dietary goals.    Expected Outcomes Short Term: Continue to assess and modify interventions until short term weight is achieved;Long Term: Adherence to nutrition and physical activity/exercise program aimed toward attainment of established weight goal;Weight Maintenance: Understanding of the daily nutrition guidelines, which includes 25-35% calories from fat, 7% or less cal from saturated fats, less than '200mg'$  cholesterol, less than 1.5gm of sodium, & 5 or more servings of fruits and vegetables daily;Weight Loss: Understanding of general recommendations for a balanced deficit meal plan, which promotes 1-2 lb weight loss per week and includes a negative energy balance of [phone] kcal/d;Understanding recommendations for meals to include 15-35% energy as protein, 25-35% energy from fat, 35-60% energy from carbohydrates, less than '200mg'$  of dietary cholesterol, 20-35 gm of total fiber daily;Understanding of distribution of calorie intake throughout the  day with the consumption of 4-5 meals/snacks    Improve shortness of breath with ADL's Yes    Intervention Provide education, individualized exercise plan and daily activity instruction to help decrease symptoms of SOB with activities of daily living.    Expected Outcomes Short Term: Improve cardiorespiratory fitness to achieve a reduction of symptoms when performing ADLs;Long Term: Be able to perform more ADLs without symptoms or delay the onset of symptoms             Core Components/Risk Factors/Patient Goals Review:   Goals and Risk Factor Review     Row Name 06/07/21 0751 07/05/21 0806           Core Components/Risk Factors/Patient Goals Review   Personal Goals Review Weight Management/Obesity;Improve shortness of breath with ADL's;Other Weight Management/Obesity;Improve shortness of breath with ADL's;Other      Review Patient was referred to CR with [facility]. He has multiple risk factors for CAD and is participating in the program for risk modification. He has completed 7 sessions. His current weight is 246.9 lb which is up 3.9 lbs from his initial weight. His personal goals for the program are to lose weight; decrease his SOB with exertion and to get back to his former level of physical fitness. We will continue to monitor his progress as he works towards meeting these goals. Patient has completed 18 sessions with his current weight of 247.4 up 1 lb from his last 30 day review. He is doing well in the program with consistent attendance and progressions. His blood pressue is elevated at times. He is seeing Dr. Domenic Polite tomorrow 5/16. We are sending a progress report with him and I am going to sent Dr. Domenic Polite a note regarding his blood pressure. Patient works hard during sessions. His personal goals for the program continue to be to lose weight and decrease his SOB with exertion and get back to his former physical fitness level. We will continue to monitor his progress as he works towards  meeting these goals.      Expected Outcomes Patient will complete the program meeting both personal and program goals. Patient will complete the program meeting both personal  and program goals.               Core Components/Risk Factors/Patient Goals at Discharge (Final Review):   Goals and Risk Factor Review - 07/05/21 0806       Core Components/Risk Factors/Patient Goals Review   Personal Goals Review Weight Management/Obesity;Improve shortness of breath with ADL's;Other    Review Patient has completed 18 sessions with his current weight of 247.4 up 1 lb from his last 30 day review. He is doing well in the program with consistent attendance and progressions. His blood pressue is elevated at times. He is seeing Dr. Domenic Polite tomorrow 5/16. We are sending a progress report with him and I am going to sent Dr. Domenic Polite a note regarding his blood pressure. Patient works hard during sessions. His personal goals for the program continue to be to lose weight and decrease his SOB with exertion and get back to his former physical fitness level. We will continue to monitor his progress as he works towards meeting these goals.    Expected Outcomes Patient will complete the program meeting both personal and program goals.             ITP Comments:  ITP Comments     Row Name 07/05/21 1426 07/07/21 1504         ITP Comments Sent Dr. Domenic Polite a message regarding patient's blood pressure and his bradycardia with pauses. He has an appointement with him tomorrow. Will continue to monitor. Dr. Domenic Polite decreased patient's Lopressor from 25 mg to 12.5 mg BID. Patient complained of fatigue and his bradycardia could be causing. Dr. Domenic Polite wants CR to monitor his HR. He may have to discontinue Lopressor if it persists. We will continue to monitor.               Comments: ITP REVIEW Pt is making expected progress toward Cardiac Rehab goals after completing 23 sessions. Recommend continued exercise,  life style modification, education, and increased stamina and strength.

## 2021-07-14 NOTE — Progress Notes (Signed)
Daily Session Note  Patient Details  Name: [name] MRN: [id number] Date of Birth: [birth year] Referring Provider:   Flowsheet Row CARDIAC REHAB PHASE II ORIENTATION from 05/19/2021 in [place]  Referring Provider Dr. Kipp Brood       Encounter Date: 07/14/2021  Check In:  Session Check In - 07/14/21 1059       Check-In   Supervising physician immediately available to respond to emergencies [facility] MD immediately available    Physician(s) Dr Domenic Polite    Location [facility]    Staff Present Redge Gainer, BS, Exercise Physiologist;Jachai Okazaki Hassell Done, RN, Jennye Moccasin, RN, BSN    Virtual Visit Yes    Medication changes reported     No    Fall or balance concerns reported    No    Tobacco Cessation No Change    Warm-up and Cool-down Performed as group-led instruction    Resistance Training Performed Yes    VAD Patient? No    PAD/SET Patient? No      Pain Assessment   Currently in Pain? No/denies    Multiple Pain Sites No             Capillary Blood Glucose: No results found for this or any previous visit (from the past 24 hour(s)).    Social History   Tobacco Use  Smoking Status Never  Smokeless Tobacco Never    Goals Met:  Independence with exercise equipment Exercise tolerated well No report of concerns or symptoms today Strength training completed today  Goals Unmet:  Not Applicable  Comments: checkout at 1200.   Dr. Carlyle Dolly is Medical Director for [facility]

## 2021-07-15 DIAGNOSIS — I10 Essential (primary) hypertension: Secondary | ICD-10-CM | POA: Diagnosis not present

## 2021-07-15 DIAGNOSIS — R7301 Impaired fasting glucose: Secondary | ICD-10-CM | POA: Diagnosis not present

## 2021-07-15 DIAGNOSIS — E782 Mixed hyperlipidemia: Secondary | ICD-10-CM | POA: Diagnosis not present

## 2021-07-16 ENCOUNTER — Encounter (HOSPITAL_COMMUNITY)
Admission: RE | Admit: 2021-07-16 | Discharge: 2021-07-16 | Disposition: A | Discharge status: Home / Self Care | Payer: PPO | Source: Ambulatory Visit | Attending: Cardiology | Admitting: Cardiology

## 2021-07-16 DIAGNOSIS — Z951 Presence of aortocoronary bypass graft: Secondary | ICD-10-CM

## 2021-07-16 NOTE — Progress Notes (Signed)
Daily Session Note  Patient Details  Name: [name] MRN: [id number] Date of Birth: 11/21/1940 Referring Provider:   Flowsheet Row CARDIAC REHAB PHASE II ORIENTATION from 05/19/2021 in [place] [facility]  Referring Provider Dr. Lightfoot       Encounter Date: 07/16/2021  Check In:  Session Check In - 07/16/21 1100       Check-In   Supervising physician immediately available to respond to emergencies [facility] MD immediately available    Physician(s) Dr Hilty    Location [facility]    Staff Present Heather Jachimiak, BS, Exercise Physiologist;Christy Edwards, RN, BSN; , RN, BSN    Virtual Visit No    Medication changes reported     No    Fall or balance concerns reported    No    Tobacco Cessation No Change    Warm-up and Cool-down Performed as group-led instruction    Resistance Training Performed Yes    VAD Patient? No    PAD/SET Patient? No      Pain Assessment   Currently in Pain? No/denies    Multiple Pain Sites No             Capillary Blood Glucose: No results found for this or any previous visit (from the past 24 hour(s)).    Social History   Tobacco Use  Smoking Status Never  Smokeless Tobacco Never    Goals Met:  Independence with exercise equipment Exercise tolerated well No report of concerns or symptoms today Strength training completed today  Goals Unmet:  Not Applicable  Comments: Checkout at 1200.   Dr. Jonathan Branch is Medical Director for [facility] 

## 2021-07-19 ENCOUNTER — Encounter (HOSPITAL_COMMUNITY): Payer: PPO

## 2021-07-21 ENCOUNTER — Encounter (HOSPITAL_COMMUNITY)
Admission: RE | Admit: 2021-07-21 | Discharge: 2021-07-21 | Disposition: A | Discharge status: Home / Self Care | Payer: PPO | Source: Ambulatory Visit | Attending: Cardiology | Admitting: Cardiology

## 2021-07-21 DIAGNOSIS — Z951 Presence of aortocoronary bypass graft: Secondary | ICD-10-CM | POA: Diagnosis not present

## 2021-07-21 NOTE — Progress Notes (Signed)
Daily Session Note  Patient Details  Name: [name] MRN: [id number] Date of Birth: [birth year] Referring Provider:   Flowsheet Row CARDIAC REHAB PHASE II ORIENTATION from 05/19/2021 in [place]  Referring Provider Dr. Kipp Brood       Encounter Date: 07/21/2021  Check In:  Session Check In - 07/21/21 1054       Check-In   Supervising physician immediately available to respond to emergencies [facility] MD immediately available    Physician(s) Dr Harl Bowie    Location [facility]    Staff Present Aundra Dubin, RN, Joanette Gula, RN, Bjorn Loser, MS, ACSM-CEP, Exercise Physiologist    Virtual Visit No    Medication changes reported     No    Fall or balance concerns reported    No    Tobacco Cessation No Change    Warm-up and Cool-down Not performed (comment)    Resistance Training Performed Yes    VAD Patient? No    PAD/SET Patient? No      Pain Assessment   Currently in Pain? No/denies    Multiple Pain Sites No             Capillary Blood Glucose: No results found for this or any previous visit (from the past 24 hour(s)).    Social History   Tobacco Use  Smoking Status Never  Smokeless Tobacco Never    Goals Met:  Independence with exercise equipment Exercise tolerated well No report of concerns or symptoms today Strength training completed today  Goals Unmet:  Not Applicable  Comments: Checkout at 1200.   Dr. Carlyle Dolly is Medical Director for [facility]

## 2021-07-23 ENCOUNTER — Encounter (HOSPITAL_COMMUNITY)
Admission: RE | Admit: 2021-07-23 | Discharge: 2021-07-23 | Disposition: A | Discharge status: Home / Self Care | Payer: PPO | Source: Ambulatory Visit | Attending: Cardiology | Admitting: Cardiology

## 2021-07-23 DIAGNOSIS — Z951 Presence of aortocoronary bypass graft: Secondary | ICD-10-CM | POA: Diagnosis not present

## 2021-07-23 NOTE — Progress Notes (Signed)
Daily Session Note  Patient Details  Name: FRIEND DORFMAN MRN: [id number] Date of Birth: 1941/01/29 Referring Provider:   Flowsheet Row CARDIAC REHAB PHASE II ORIENTATION from 05/19/2021 in [place]  Referring Provider Dr. Kipp Brood       Encounter Date: 07/23/2021  Check In:  Session Check In - 07/23/21 1058       Check-In   Supervising physician immediately available to respond to emergencies [facility] MD immediately available    Physician(s) Dr Gardiner Rhyme    Location [facility]    Staff Present Aundra Dubin, RN, Joanette Gula, RN, Bjorn Loser, MS, ACSM-CEP, Exercise Physiologist    Virtual Visit No    Medication changes reported     No    Fall or balance concerns reported    No    Tobacco Cessation No Change    Warm-up and Cool-down Performed as group-led instruction    Resistance Training Performed Yes    VAD Patient? No    PAD/SET Patient? No      Pain Assessment   Currently in Pain? No/denies    Multiple Pain Sites No             Capillary Blood Glucose: No results found for this or any previous visit (from the past 24 hour(s)).    Social History   Tobacco Use  Smoking Status Never  Smokeless Tobacco Never    Goals Met:  Independence with exercise equipment Exercise tolerated well No report of concerns or symptoms today Strength training completed today  Goals Unmet:  Not Applicable  Comments: check out at 1200.   Dr. Carlyle Dolly is Medical Director for [facility]

## 2021-07-26 ENCOUNTER — Encounter (HOSPITAL_COMMUNITY)
Admission: RE | Admit: 2021-07-26 | Discharge: 2021-07-26 | Disposition: A | Discharge status: Home / Self Care | Payer: PPO | Source: Ambulatory Visit | Attending: Cardiology | Admitting: Cardiology

## 2021-07-26 VITALS — Wt 246.5 lb

## 2021-07-26 DIAGNOSIS — Z951 Presence of aortocoronary bypass graft: Secondary | ICD-10-CM

## 2021-07-26 NOTE — Progress Notes (Signed)
Daily Session Note  Patient Details  Name: [name] MRN: [id number] Date of Birth: Oct 12, 1940 Referring Provider:   Flowsheet Row CARDIAC REHAB PHASE II ORIENTATION from 05/19/2021 in [place]  Referring Provider Dr. Kipp Brood       Encounter Date: 07/26/2021  Check In:  Session Check In - 07/26/21 1100       Check-In   Supervising physician immediately available to respond to emergencies [facility] MD immediately available    Physician(s) Sr. Marlou Porch    Location [facility]    Staff Present Redge Gainer, BS, Exercise Physiologist;Debra Wynetta Emery, RN, BSN    Virtual Visit No    Medication changes reported     No    Fall or balance concerns reported    No    Tobacco Cessation No Change    Warm-up and Cool-down Performed as group-led instruction    Resistance Training Performed Yes    VAD Patient? No    PAD/SET Patient? No      Pain Assessment   Currently in Pain? No/denies    Multiple Pain Sites No             Capillary Blood Glucose: No results found for this or any previous visit (from the past 24 hour(s)).    Social History   Tobacco Use  Smoking Status Never  Smokeless Tobacco Never    Goals Met:  Independence with exercise equipment Exercise tolerated well No report of concerns or symptoms today Strength training completed today  Goals Unmet:  Not Applicable  Comments: check out 1200   Dr. Carlyle Dolly is Medical Director for [facility]

## 2021-07-27 DIAGNOSIS — M25511 Pain in right shoulder: Secondary | ICD-10-CM | POA: Diagnosis not present

## 2021-07-27 DIAGNOSIS — Z8546 Personal history of malignant neoplasm of prostate: Secondary | ICD-10-CM | POA: Diagnosis not present

## 2021-07-27 DIAGNOSIS — J302 Other seasonal allergic rhinitis: Secondary | ICD-10-CM | POA: Diagnosis not present

## 2021-07-27 DIAGNOSIS — N1832 Chronic kidney disease, stage 3b: Secondary | ICD-10-CM | POA: Diagnosis not present

## 2021-07-27 DIAGNOSIS — M179 Osteoarthritis of knee, unspecified: Secondary | ICD-10-CM | POA: Diagnosis not present

## 2021-07-27 DIAGNOSIS — R0609 Other forms of dyspnea: Secondary | ICD-10-CM | POA: Diagnosis not present

## 2021-07-27 DIAGNOSIS — E782 Mixed hyperlipidemia: Secondary | ICD-10-CM | POA: Diagnosis not present

## 2021-07-27 DIAGNOSIS — I2510 Atherosclerotic heart disease of native coronary artery without angina pectoris: Secondary | ICD-10-CM | POA: Diagnosis not present

## 2021-07-27 DIAGNOSIS — R7303 Prediabetes: Secondary | ICD-10-CM | POA: Diagnosis not present

## 2021-07-27 DIAGNOSIS — Z0001 Encounter for general adult medical examination with abnormal findings: Secondary | ICD-10-CM | POA: Diagnosis not present

## 2021-07-27 DIAGNOSIS — Z951 Presence of aortocoronary bypass graft: Secondary | ICD-10-CM | POA: Diagnosis not present

## 2021-07-27 DIAGNOSIS — I129 Hypertensive chronic kidney disease with stage 1 through stage 4 chronic kidney disease, or unspecified chronic kidney disease: Secondary | ICD-10-CM | POA: Diagnosis not present

## 2021-07-28 ENCOUNTER — Encounter (HOSPITAL_COMMUNITY)
Admission: RE | Admit: 2021-07-28 | Discharge: 2021-07-28 | Disposition: A | Discharge status: Home / Self Care | Payer: PPO | Source: Ambulatory Visit | Attending: Cardiology | Admitting: Cardiology

## 2021-07-28 DIAGNOSIS — Z951 Presence of aortocoronary bypass graft: Secondary | ICD-10-CM | POA: Diagnosis not present

## 2021-07-28 NOTE — Progress Notes (Signed)
Daily Session Note  Patient Details  Name: [name] MRN: [id number] Date of Birth: 01/21/1941 Referring Provider:   Flowsheet Row CARDIAC REHAB PHASE II ORIENTATION from 05/19/2021 in [place]  Referring Provider Dr. Kipp Brood       Encounter Date: 07/28/2021  Check In:  Session Check In - 07/28/21 1054       Check-In   Supervising physician immediately available to respond to emergencies [facility] MD immediately available    Physician(s) Dr Johnsie Cancel    Location [facility]    Staff Present Redge Gainer, BS, Exercise Physiologist;Debra Wynetta Emery, RN, Joanette Gula, RN, BSN    Virtual Visit No    Medication changes reported     No    Fall or balance concerns reported    No    Tobacco Cessation No Change    Warm-up and Cool-down Performed as group-led instruction    Resistance Training Performed Yes    VAD Patient? No    PAD/SET Patient? No      Pain Assessment   Currently in Pain? No/denies    Multiple Pain Sites No             Capillary Blood Glucose: No results found for this or any previous visit (from the past 24 hour(s)).    Social History   Tobacco Use  Smoking Status Never  Smokeless Tobacco Never    Goals Met:  Independence with exercise equipment Exercise tolerated well No report of concerns or symptoms today Strength training completed today  Goals Unmet:  Not Applicable  Comments: Checkout at 1200.   Dr. Carlyle Dolly is Medical Director for [facility]

## 2021-07-30 ENCOUNTER — Encounter (HOSPITAL_COMMUNITY)
Admission: RE | Admit: 2021-07-30 | Discharge: 2021-07-30 | Disposition: A | Discharge status: Home / Self Care | Payer: PPO | Source: Ambulatory Visit | Attending: Cardiology | Admitting: Cardiology

## 2021-07-30 DIAGNOSIS — Z951 Presence of aortocoronary bypass graft: Secondary | ICD-10-CM

## 2021-07-30 NOTE — Progress Notes (Signed)
Daily Session Note  Patient Details  Name: [name] MRN: [id number] Date of Birth: 01-29-1941 Referring Provider:   Flowsheet Row CARDIAC REHAB PHASE II ORIENTATION from 05/19/2021 in [place]  Referring Provider Dr. Kipp Brood       Encounter Date: 07/30/2021  Check In:  Session Check In - 07/30/21 1100       Check-In   Supervising physician immediately available to respond to emergencies [facility] MD immediately available    Physician(s) Dr. Domenic Polite    Location [facility]    Staff Present Redge Gainer, BS, Exercise Physiologist;Debra Wynetta Emery, RN, BSN    Virtual Visit No    Medication changes reported     No    Fall or balance concerns reported    No    Tobacco Cessation No Change    Warm-up and Cool-down Performed as group-led instruction    Resistance Training Performed Yes    VAD Patient? No    PAD/SET Patient? No      Pain Assessment   Currently in Pain? No/denies    Multiple Pain Sites No             Capillary Blood Glucose: No results found for this or any previous visit (from the past 24 hour(s)).    Social History   Tobacco Use  Smoking Status Never  Smokeless Tobacco Never    Goals Met:  Independence with exercise equipment Exercise tolerated well No report of concerns or symptoms today Strength training completed today  Goals Unmet:  Not Applicable  Comments: check out 1200   Dr. Carlyle Dolly is Medical Director for [facility]

## 2021-08-02 ENCOUNTER — Encounter (HOSPITAL_COMMUNITY)
Admission: RE | Admit: 2021-08-02 | Discharge: 2021-08-02 | Disposition: A | Discharge status: Home / Self Care | Payer: PPO | Source: Ambulatory Visit | Attending: Cardiology | Admitting: Cardiology

## 2021-08-02 DIAGNOSIS — Z951 Presence of aortocoronary bypass graft: Secondary | ICD-10-CM

## 2021-08-02 NOTE — Progress Notes (Signed)
Daily Session Note  Patient Details  Name: [name] MRN: [id number] Date of Birth: 1941/02/17 Referring Provider:   Flowsheet Row CARDIAC REHAB PHASE II ORIENTATION from 05/19/2021 in [place]  Referring Provider Dr. Kipp Brood       Encounter Date: 08/02/2021  Check In:  Session Check In - 08/02/21 1100       Check-In   Supervising physician immediately available to respond to emergencies [facility] MD immediately available    Physician(s) Dr. Debara Pickett    Location [facility]    Staff Present Redge Gainer, BS, Exercise Physiologist;Dalton Kris Mouton, MS, ACSM-CEP, Exercise Physiologist    Virtual Visit No    Medication changes reported     No    Fall or balance concerns reported    No    Tobacco Cessation No Change    Warm-up and Cool-down Performed as group-led instruction    Resistance Training Performed Yes    VAD Patient? No    PAD/SET Patient? No      Pain Assessment   Currently in Pain? No/denies    Multiple Pain Sites No             Capillary Blood Glucose: No results found for this or any previous visit (from the past 24 hour(s)).    Social History   Tobacco Use  Smoking Status Never  Smokeless Tobacco Never    Goals Met:  Independence with exercise equipment Exercise tolerated well No report of concerns or symptoms today Strength training completed today  Goals Unmet:  Not Applicable  Comments: check out 1200   Dr. Carlyle Dolly is Medical Director for [place]

## 2021-08-04 ENCOUNTER — Encounter (HOSPITAL_COMMUNITY)
Admission: RE | Admit: 2021-08-04 | Discharge: 2021-08-04 | Disposition: A | Discharge status: Home / Self Care | Payer: PPO | Source: Ambulatory Visit | Attending: Cardiology | Admitting: Cardiology

## 2021-08-04 DIAGNOSIS — Z951 Presence of aortocoronary bypass graft: Secondary | ICD-10-CM | POA: Diagnosis not present

## 2021-08-04 NOTE — Progress Notes (Signed)
Daily Session Note  Patient Details  Name: [name] MRN: [id number] Date of Birth: 04-Sep-1940 Referring Provider:   Flowsheet Row CARDIAC REHAB PHASE II ORIENTATION from 05/19/2021 in [place]  Referring Provider Dr. Kipp Brood       Encounter Date: 08/04/2021  Check In:  Session Check In - 08/04/21 1100       Check-In   Supervising physician immediately available to respond to emergencies [facility] MD immediately available    Physician(s) Dr. Radford Pax    Location [facility]    Staff Present Redge Gainer, BS, Exercise Physiologist;Dalton Kris Mouton, MS, ACSM-CEP, Exercise Physiologist;Daphyne Hassell Done, RN, BSN    Virtual Visit No    Fall or balance concerns reported    No    Tobacco Cessation No Change    Warm-up and Cool-down Performed as group-led instruction    Resistance Training Performed Yes    VAD Patient? No    PAD/SET Patient? No      Pain Assessment   Currently in Pain? No/denies    Multiple Pain Sites No             Capillary Blood Glucose: No results found for this or any previous visit (from the past 24 hour(s)).    Social History   Tobacco Use  Smoking Status Never  Smokeless Tobacco Never    Goals Met:  Independence with exercise equipment Exercise tolerated well No report of concerns or symptoms today Strength training completed today  Goals Unmet:  Not Applicable  Comments: Check out 120   Dr. Carlyle Dolly is Medical Director for [facility] [facility]

## 2021-08-06 ENCOUNTER — Encounter (HOSPITAL_COMMUNITY)
Admission: RE | Admit: 2021-08-06 | Discharge: 2021-08-06 | Disposition: A | Discharge status: Home / Self Care | Payer: PPO | Source: Ambulatory Visit | Attending: Cardiology | Admitting: Cardiology

## 2021-08-06 DIAGNOSIS — Z951 Presence of aortocoronary bypass graft: Secondary | ICD-10-CM | POA: Diagnosis not present

## 2021-08-09 ENCOUNTER — Encounter (HOSPITAL_COMMUNITY)
Admission: RE | Admit: 2021-08-09 | Discharge: 2021-08-09 | Disposition: A | Discharge status: Home / Self Care | Payer: PPO | Source: Ambulatory Visit | Attending: Cardiology | Admitting: Cardiology

## 2021-08-09 DIAGNOSIS — Z951 Presence of aortocoronary bypass graft: Secondary | ICD-10-CM

## 2021-08-09 NOTE — Progress Notes (Signed)
Daily Session Note  Patient Details  Name: [name] MRN: [id number] Date of Birth: 1940/05/11 Referring Provider:   Flowsheet Row CARDIAC REHAB PHASE II ORIENTATION from 05/19/2021 in [place]  Referring Provider Dr. Kipp Brood       Encounter Date: 08/09/2021  Check In:  Session Check In - 08/09/21 1057       Check-In   Supervising physician immediately available to respond to emergencies [facility] MD immediately available    Physician(s) Dr Kristine Garbe    Location [facility]    Staff Present Redge Gainer, BS, Exercise Physiologist;Wania Longstreth Hassell Done, RN, Jennye Moccasin, RN, BSN    Virtual Visit No    Medication changes reported     No    Fall or balance concerns reported    No    Tobacco Cessation No Change    Warm-up and Cool-down Performed as group-led instruction    Resistance Training Performed Yes    VAD Patient? No    PAD/SET Patient? No      Pain Assessment   Currently in Pain? No/denies    Multiple Pain Sites No             Capillary Blood Glucose: No results found for this or any previous visit (from the past 24 hour(s)).    Social History   Tobacco Use  Smoking Status Never  Smokeless Tobacco Never    Goals Met:  Independence with exercise equipment Exercise tolerated well No report of concerns or symptoms today Strength training completed today  Goals Unmet:  Not Applicable  Comments: Checkout at 1200.   Dr. Carlyle Dolly is Medical Director for [facility]

## 2021-08-11 ENCOUNTER — Encounter (HOSPITAL_COMMUNITY)
Admission: RE | Admit: 2021-08-11 | Discharge: 2021-08-11 | Disposition: A | Discharge status: Home / Self Care | Payer: PPO | Source: Ambulatory Visit | Attending: Cardiology | Admitting: Cardiology

## 2021-08-11 DIAGNOSIS — Z951 Presence of aortocoronary bypass graft: Secondary | ICD-10-CM

## 2021-08-11 NOTE — Progress Notes (Signed)
Daily Session Note  Patient Details  Name: [name] MRN: [id number] Date of Birth: [birth year] Referring Provider:   Flowsheet Row CARDIAC REHAB PHASE II ORIENTATION from 05/19/2021 in [facility]  Referring Provider Dr. Kipp Brood       Encounter Date: 08/11/2021  Check In:  Session Check In - 08/11/21 1100       Check-In   Supervising physician immediately available to respond to emergencies [facility] MD immediately available    Physician(s) Dr Gasper Sells    Location [facility]    Staff Present Redge Gainer, BS, Exercise Physiologist;Hakan Nudelman Hassell Done, RN, Jennye Moccasin, RN, BSN    Virtual Visit No    Medication changes reported     No    Fall or balance concerns reported    No    Tobacco Cessation No Change    Warm-up and Cool-down Performed as group-led instruction    Resistance Training Performed Yes    VAD Patient? No    PAD/SET Patient? No      Pain Assessment   Currently in Pain? No/denies    Multiple Pain Sites No             Capillary Blood Glucose: No results found for this or any previous visit (from the past 24 hour(s)).    Social History   Tobacco Use  Smoking Status Never  Smokeless Tobacco Never    Goals Met:  Independence with exercise equipment Exercise tolerated well No report of concerns or symptoms today Strength training completed today  Goals Unmet:  Not Applicable  Comments: Checkout at 1200.   Dr. Carlyle Dolly is Medical Director for [facility]

## 2021-08-13 ENCOUNTER — Encounter (HOSPITAL_COMMUNITY)
Admission: RE | Admit: 2021-08-13 | Discharge: 2021-08-13 | Disposition: A | Discharge status: Home / Self Care | Payer: PPO | Source: Ambulatory Visit | Attending: Cardiology | Admitting: Cardiology

## 2021-08-13 VITALS — Ht 74.0 in | Wt 245.2 lb

## 2021-08-13 DIAGNOSIS — Z951 Presence of aortocoronary bypass graft: Secondary | ICD-10-CM

## 2021-08-21 NOTE — Progress Notes (Unsigned)
Cardiology Office Note  Date: 08/23/2021   ID: [name], [name] [birth year], MRN [id number]  PCP:  Celene Squibb, MD  Cardiologist:  Rozann Lesches, MD Electrophysiologist:  None   Chief Complaint  Patient presents with   Cardiac follow-up    History of Present Illness: [name] is an 81 y.o. male last seen in May.  He presents for a follow-up visit.  He did complete cardiac rehabilitation in the interim.  Overall did well although blood pressure was increased and he did remain in fairly bradycardic despite cutting back Lopressor.  He is concerned about sternal pain that he has had recently.  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  Somewhat of a pleuritic component.  No cough or hemoptysis.  No orthopnea or PND.  I reviewed his medications and discussed stopping Lopressor completely.  We will add Norvasc for blood pressure control.  We also discussed arranging a noncontrasted chest CT to make sure that his sternum remains intact.  Past Medical History:  Diagnosis Date   Arthritis    CAD (coronary artery disease)    Two-vessel obstructive disease November 2022; CABG with LIMA to LAD and SVG to PDA January 2023   Chronic back pain    CKD (chronic kidney disease) stage 3, GFR 30-59 ml/min (HCC)    Essential hypertension    GERD (gastroesophageal reflux disease)    Helicobacter pylori gastritis    Treated   History of kidney stones    Hx of adenomatous colonic polyps    IBS (irritable bowel syndrome)    PAF (paroxysmal atrial fibrillation) (HCC)    Prostate CA (HCC)    Seed implants   Schatzki's ring    Stroke [facility])     Past Surgical History:  Procedure Laterality Date   APPENDECTOMY  age 18   BACK SURGERY  02/08   [facility]   BACK SURGERY  09/24/04   lumbar, [facility]   BACK SURGERY  09/18/02   [facility]   BILIARY STENT PLACEMENT  01/07/2012   Procedure: BILIARY STENT PLACEMENT;  Surgeon: Rogene Houston, MD;  Location: [facility];  Service: Endoscopy;  Laterality: N/A;   BILIARY STENT PLACEMENT  02/21/2012   Procedure: BILIARY STENT PLACEMENT;  Surgeon: Rogene Houston, MD;  Location: [facility];  Service: Endoscopy;  Laterality: N/A;  Biliary Stent Replacement   CATARACT EXTRACTION W/PHACO  11/22/2010   Procedure: CATARACT EXTRACTION PHACO AND INTRAOCULAR LENS PLACEMENT ([place]);  Surgeon: Williams Che;  Location: [facility];  Service: Ophthalmology;  Laterality: Right;  CDE: 6.81   CHOLECYSTECTOMY  03/04/05   [facility], [facility]. Gangrenous cholecystitis complicated by abscess requiring percutaneous drainage. He also had common duct stones requiring ERCP and sphincterotomy.   COLONOSCOPY  August 2005   Scattered sigmoid diverticulosis, splenic flexure polyp. Hyperplastic   COLONOSCOPY  1997   3 cm tubular adenoma and a sending colon   COLONOSCOPY  02/01/2011   Rourk-friable anal canal, hyperplastic rectal polyp   COLONOSCOPY N/A 08/15/2013   Procedure: COLONOSCOPY;  Surgeon: Rogene Houston, MD;  Location: [facility];  Service: Endoscopy;  Laterality: N/A;  100   COLONOSCOPY N/A 09/20/2018   Procedure: COLONOSCOPY;  Surgeon: Rogene Houston, MD;  Location[facility];  Service: Endoscopy;  Laterality: N/A;  12:00pm   CORONARY ARTERY BYPASS GRAFT N/A 03/09/2021   Procedure: OFF PUMP CORONARY ARTERY  BYPASS GRAFTING (CABG) X2, USING LEFT INTERNAL MAMMARY ARTERY AND RIGHT LEG GREATER SAPHENOUS VEIN HARVESTED ENDOSCOPICALLY;  Surgeon: Lajuana Matte, MD;  Location: [place];  Service: Open Heart Surgery;  Laterality: N/A;  X 2   ERCP  03/02/05   [facility]. Normal-appearing biliary tree (gallbladder not image), status post sphincterotomy with recovery of small pieces of stone material, status post balloon occlusion cholangiogram.   ERCP  01/07/2012   Procedure: ENDOSCOPIC RETROGRADE CHOLANGIOPANCREATOGRAPHY (ERCP);  Surgeon: Rogene Houston, MD;  Location: [facility];  Service: Endoscopy;  Laterality: N/A;   possible biliary stenting   ERCP  02/21/2012   Procedure: ENDOSCOPIC RETROGRADE CHOLANGIOPANCREATOGRAPHY (ERCP);  Surgeon: Rogene Houston, MD;  Location: [facility];  Service: Endoscopy;  Laterality: N/A;  common bile duct stone and clip removed   ERCP N/A 05/18/2012   Procedure: ENDOSCOPIC RETROGRADE CHOLANGIOPANCREATOGRAPHY (ERCP);  Surgeon: Rogene Houston, MD;  Location: [facility];  Service: Endoscopy;  Laterality: N/A;  stone and debri removal   ESOPHAGOGASTRODUODENOSCOPY  August 2005   Erosive esophagitis and Schatzki ring, small hiatal hernia   ESOPHAGOGASTRODUODENOSCOPY  01/2008   Mild reflux esophagitis, small hiatal hernia   ESOPHAGOGASTRODUODENOSCOPY  02/01/2011   Rourk-erosive reflux esophagitis, small hiatal hernia   ESOPHAGOGASTRODUODENOSCOPY  10/26/2011   Dr. Eli Phillips gastric submucosal petechia (bx-benign ulceration), juxta ampullary duodenal diverticulum and some mucosal edema involving 1st/2nd portion of duodenum with superficial erosions (bx-superficial ulceration/benign)   ESOPHAGOGASTRODUODENOSCOPY  02/21/2012   Procedure: ESOPHAGOGASTRODUODENOSCOPY (EGD);  Surgeon: Rogene Houston, MD;  Location: [facility];  Service: Endoscopy;  Laterality: N/A;   EYE SURGERY     Incomplete colonoscopy  December 2009   Left-sided diverticula, mid descending colon polyp, due to recurrent looping and redundancy exam was incomplete. It was felt that the mid colon was reached. Followup barium enema showed colon interposition between the liver and the diaphragm, redundant sigmoid colon but no colon mass or polyp identified.. Pathology revealed tubular adenoma.   LEFT HEART CATH AND CORONARY ANGIOGRAPHY N/A 01/18/2021   Procedure: LEFT HEART CATH AND CORONARY ANGIOGRAPHY;  Surgeon: Lorretta Harp, MD;  Location: [place] [facility];  Service: Cardiovascular;  Laterality: N/A;   POLYPECTOMY  09/20/2018   Procedure: POLYPECTOMY;  Surgeon: Rogene Houston, MD;  [facility];  Service:  Endoscopy;;  Hepatic flexure polyp cold snare, splenic flexure polyp   SPHINCTEROTOMY  01/07/2012   Procedure: SPHINCTEROTOMY;  Surgeon: Rogene Houston, MD;  Location: [facility];  Service: Endoscopy;  Laterality: N/A;  Extended   SPYGLASS CHOLANGIOSCOPY  02/21/2012   Procedure: SPYGLASS CHOLANGIOSCOPY;  Surgeon: Rogene Houston, MD;  Location: [facility];  Service: Endoscopy;  Laterality: N/A;   SPYGLASS CHOLANGIOSCOPY N/A 05/18/2012   Procedure: SPYGLASS CHOLANGIOSCOPY;  Surgeon: Rogene Houston, MD;  Location: [facility];  Service: Endoscopy;  Laterality: N/A;   TEE WITHOUT CARDIOVERSION N/A 03/09/2021   Procedure: TRANSESOPHAGEAL ECHOCARDIOGRAM (TEE);  Surgeon: Lajuana Matte, MD;  Location: [place];  Service: Open Heart Surgery;  Laterality: N/A;   TOTAL KNEE ARTHROPLASTY  03/21/2012   Procedure: TOTAL KNEE ARTHROPLASTY;  Surgeon: [name], MD;  Location: [place];  Service: Orthopedics;  Laterality: Right;  right knee arthroplasty   TOTAL KNEE ARTHROPLASTY Left 04/08/2019   Procedure: LEFT TOTAL KNEE ARTHROPLASTY;  Surgeon: Frederik Pear, MD;  Location: [facility];  Service: Orthopedics;  Laterality: Left;    Current Outpatient Medications  Medication Sig Dispense Refill   amLODipine (NORVASC) 5 MG tablet Take 1  tablet (5 mg total) by mouth daily. 30 tablet 6   apixaban (ELIQUIS) 2.5 MG TABS tablet TAKE ONE TABLET (2.'5MG'$  TOTAL) BY MOUTH TWO TIMES DAILY 60 tablet 5   aspirin EC 81 MG tablet Take 81 mg by mouth in the morning. Swallow whole.     atorvastatin (LIPITOR) 20 MG tablet Take 1 tablet (20 mg total) by mouth daily. (Patient taking differently: Take 20 mg by mouth every evening.) 90 tablet 3   No current facility-administered medications for this visit.   Allergies:  Patient has no known allergies.   ROS: No palpitations or syncope.  Physical Exam: VS:  BP (!) 158/70   Pulse (!) 45   Ht '6\' 2"'$  (1.88 m)   Wt 251 lb (113.9 kg)   SpO2 94%   BMI 32.23 kg/m , BMI Body mass index is 32.23  kg/m.  Wt Readings from Last 3 Encounters:  08/23/21 251 lb (113.9 kg)  08/13/21 245 lb 2.4 oz (111.2 kg)  07/26/21 246 lb 7.6 oz (111.8 kg)    General: Patient appears comfortable at rest. HEENT: Conjunctiva and lids normal, oropharynx clear. Neck: Supple, no elevated JVP or carotid bruits, no thyromegaly. Lungs: Clear to auscultation, nonlabored breathing at rest. Cardiac: Regular rate and rhythm, no S3, 1/6 systolic murmur. Extremities: No pitting edema.  ECG:  An ECG dated 07/06/2021 was personally reviewed today and demonstrated:  Sinus bradycardia with right bundle branch block and left anterior fascicular block.  Recent Labwork: 03/05/2021: ALT 28; AST 29 03/10/2021: Magnesium 2.3 03/14/2021: B Natriuretic Peptide 155.0 03/15/2021: BUN 49; Creatinine, Ser 1.65; Hemoglobin 9.9; Platelets 156; Potassium 3.9; Sodium 139     Component Value Date/Time   CHOL 200 11/10/2015 0734   TRIG 208 (H) 11/10/2015 0734   HDL 34 (L) 11/10/2015 0734   CHOLHDL 5.9 (H) 11/10/2015 0734   VLDL 42 (H) 11/10/2015 0734   LDLCALC 124 11/10/2015 0734    Other Studies Reviewed Today:  Echocardiogram 02/04/2021:  1. Left ventricular ejection fraction, by estimation, is 50 to 55%. The  left ventricle has low normal function. The left ventricle has no regional  wall motion abnormalities. There is mild left ventricular hypertrophy of  the basal-septal segment. Left  ventricular diastolic parameters are consistent with Grade I diastolic  dysfunction (impaired relaxation). Elevated left atrial pressure.   2. Right ventricular systolic function is normal. The right ventricular  size is normal.   3. Left atrial size was mildly dilated.   4. The mitral valve is normal in structure. No evidence of mitral valve  regurgitation. No evidence of mitral stenosis.   5. The aortic valve is calcified. Aortic valve regurgitation is mild. No  aortic stenosis is present.   6. Aortic dilatation noted. There is  borderline dilatation of the aortic  root, measuring 38 mm.    Cardiac monitor 04/05/2021: HR 47 - 132, average 63 bpm. 1 NSVT lasting 4 beats. Rare supraventricular and ventricular ectopy. No sustained arrhythmias.   Chest CT 05/12/2021: IMPRESSION: 1. 7 mm perifissural nodule in the posterior right upper lobe is new since 01/22/2020. Non-contrast chest CT at 6-12 months is recommended. If the nodule is stable at time of repeat CT, then future CT at 18-24 months (from today's scan) is considered optional for low-risk patients, but is recommended for high-risk patients. This recommendation follows the consensus statement: Guidelines for Management of Incidental Pulmonary Nodules Detected on CT Images: From the Fleischner Society 2017; Radiology 2017; 284:228-243. 2. Areas of architectural distortion/scarring  in the right middle lobe, right lower lobe, and lingula. 3. Aortic Atherosclerosis (ICD10-I70.0).  Assessment and Plan:  1.  Atypical sternal discomfort, likely inflammatory although we will obtain a noncontrasted chest CT just to make sure that sternal union is intact.  I cautioned him against heavy lifting.  2.  CAD status post CABG in January, completed cardiac rehabilitation.  Plan to continue aspirin and Lipitor.  We will stop Lopressor given persistent bradycardia.  3.  Essential hypertension, starting Norvasc 5 mg daily.  4.  Postoperative atrial fibrillation with tachycardia-bradycardia syndrome.  Not bothered by palpitations at this time.  We are stopping beta-blocker given bradycardia as discussed above.  Continue Eliquis with CHA2DS2-VASc score of 6.  Medication Adjustments/Labs and Tests Ordered: Current medicines are reviewed at length with the patient today.  Concerns regarding medicines are outlined above.   Tests Ordered: Orders Placed This Encounter  Procedures   CT Chest Wo Contrast    Medication Changes: Meds ordered this encounter  Medications    amLODipine (NORVASC) 5 MG tablet    Sig: Take 1 tablet (5 mg total) by mouth daily.    Dispense:  30 tablet    Refill:  6    Stopping Lopressor, med changed 08/23/2021    Disposition:  Follow up  6 months.  Signed, Satira Sark, MD, [facility] 08/23/2021 11:33 AM    [facility] [place], [place], [place] [address] Phone: [phone]; Fax: [phone]

## 2021-08-23 ENCOUNTER — Telehealth: Payer: Self-pay | Admitting: Cardiology

## 2021-08-23 ENCOUNTER — Ambulatory Visit: Payer: PPO | Admitting: Cardiology

## 2021-08-23 ENCOUNTER — Encounter: Payer: Self-pay | Admitting: Cardiology

## 2021-08-23 VITALS — BP 158/70 | HR 45 | Ht 74.0 in | Wt 251.0 lb

## 2021-08-23 DIAGNOSIS — R0789 Other chest pain: Secondary | ICD-10-CM

## 2021-08-23 DIAGNOSIS — I10 Essential (primary) hypertension: Secondary | ICD-10-CM | POA: Diagnosis not present

## 2021-08-23 DIAGNOSIS — I25119 Atherosclerotic heart disease of native coronary artery with unspecified angina pectoris: Secondary | ICD-10-CM

## 2021-08-23 MED ORDER — AMLODIPINE BESYLATE 5 MG PO TABS: 5.0000 mg | ORAL_TABLET | Freq: Every day | ORAL | 6 refills | Status: DC

## 2021-08-23 NOTE — Telephone Encounter (Signed)
Checking percert on the following patient for testing scheduled at [facility].    CT CHEST W/O CONTRAST  09/10/2021

## 2021-08-23 NOTE — Patient Instructions (Signed)
Medication Instructions:  Stop Lopressor (Metoprolol Tart) Begin Norvasc '5mg'$  daily Continue all other medications.     Labwork: none  Testing/Procedures: Chest CT without contrast  Office will contact with results via phone or letter.     Follow-Up: 6 months   Any Other Special Instructions Will Be Listed Below (If Applicable).   If you need a refill on your cardiac medications before your next appointment, please call your pharmacy. \

## 2021-08-31 DIAGNOSIS — R2232 Localized swelling, mass and lump, left upper limb: Secondary | ICD-10-CM | POA: Diagnosis not present

## 2021-08-31 DIAGNOSIS — N649 Disorder of breast, unspecified: Secondary | ICD-10-CM | POA: Diagnosis not present

## 2021-08-31 DIAGNOSIS — I495 Sick sinus syndrome: Secondary | ICD-10-CM | POA: Diagnosis not present

## 2021-08-31 DIAGNOSIS — I4819 Other persistent atrial fibrillation: Secondary | ICD-10-CM | POA: Diagnosis not present

## 2021-08-31 DIAGNOSIS — R911 Solitary pulmonary nodule: Secondary | ICD-10-CM | POA: Diagnosis not present

## 2021-09-09 DIAGNOSIS — H0102B Squamous blepharitis left eye, upper and lower eyelids: Secondary | ICD-10-CM | POA: Diagnosis not present

## 2021-09-09 DIAGNOSIS — H0288B Meibomian gland dysfunction left eye, upper and lower eyelids: Secondary | ICD-10-CM | POA: Diagnosis not present

## 2021-09-09 DIAGNOSIS — Z961 Presence of intraocular lens: Secondary | ICD-10-CM | POA: Diagnosis not present

## 2021-09-09 DIAGNOSIS — H0102A Squamous blepharitis right eye, upper and lower eyelids: Secondary | ICD-10-CM | POA: Diagnosis not present

## 2021-09-09 DIAGNOSIS — H04203 Unspecified epiphora, bilateral lacrimal glands: Secondary | ICD-10-CM | POA: Diagnosis not present

## 2021-09-09 DIAGNOSIS — H1045 Other chronic allergic conjunctivitis: Secondary | ICD-10-CM | POA: Diagnosis not present

## 2021-09-09 DIAGNOSIS — H04123 Dry eye syndrome of bilateral lacrimal glands: Secondary | ICD-10-CM | POA: Diagnosis not present

## 2021-09-09 DIAGNOSIS — H0288A Meibomian gland dysfunction right eye, upper and lower eyelids: Secondary | ICD-10-CM | POA: Diagnosis not present

## 2021-09-10 ENCOUNTER — Ambulatory Visit (HOSPITAL_COMMUNITY)
Admission: RE | Admit: 2021-09-10 | Discharge: 2021-09-10 | Disposition: A | Discharge status: Home / Self Care | Payer: PPO | Source: Ambulatory Visit | Attending: Cardiology | Admitting: Cardiology

## 2021-09-10 DIAGNOSIS — R911 Solitary pulmonary nodule: Secondary | ICD-10-CM | POA: Diagnosis not present

## 2021-09-10 DIAGNOSIS — J9811 Atelectasis: Secondary | ICD-10-CM | POA: Diagnosis not present

## 2021-09-10 DIAGNOSIS — R0789 Other chest pain: Secondary | ICD-10-CM | POA: Diagnosis not present

## 2021-09-17 DIAGNOSIS — I10 Essential (primary) hypertension: Secondary | ICD-10-CM | POA: Diagnosis not present

## 2021-09-23 DIAGNOSIS — H0102B Squamous blepharitis left eye, upper and lower eyelids: Secondary | ICD-10-CM | POA: Diagnosis not present

## 2021-09-23 DIAGNOSIS — H0288B Meibomian gland dysfunction left eye, upper and lower eyelids: Secondary | ICD-10-CM | POA: Diagnosis not present

## 2021-09-23 DIAGNOSIS — H26491 Other secondary cataract, right eye: Secondary | ICD-10-CM | POA: Diagnosis not present

## 2021-09-23 DIAGNOSIS — Z961 Presence of intraocular lens: Secondary | ICD-10-CM | POA: Diagnosis not present

## 2021-09-23 DIAGNOSIS — H0288A Meibomian gland dysfunction right eye, upper and lower eyelids: Secondary | ICD-10-CM | POA: Diagnosis not present

## 2021-09-23 DIAGNOSIS — H40013 Open angle with borderline findings, low risk, bilateral: Secondary | ICD-10-CM | POA: Diagnosis not present

## 2021-09-23 DIAGNOSIS — H04203 Unspecified epiphora, bilateral lacrimal glands: Secondary | ICD-10-CM | POA: Diagnosis not present

## 2021-09-23 DIAGNOSIS — H02831 Dermatochalasis of right upper eyelid: Secondary | ICD-10-CM | POA: Diagnosis not present

## 2021-09-23 DIAGNOSIS — H47322 Drusen of optic disc, left eye: Secondary | ICD-10-CM | POA: Diagnosis not present

## 2021-09-23 DIAGNOSIS — H02834 Dermatochalasis of left upper eyelid: Secondary | ICD-10-CM | POA: Diagnosis not present

## 2021-09-23 DIAGNOSIS — H04123 Dry eye syndrome of bilateral lacrimal glands: Secondary | ICD-10-CM | POA: Diagnosis not present

## 2021-09-23 DIAGNOSIS — H0102A Squamous blepharitis right eye, upper and lower eyelids: Secondary | ICD-10-CM | POA: Diagnosis not present

## 2021-10-20 ENCOUNTER — Telehealth: Payer: Self-pay | Admitting: Cardiology

## 2021-10-20 NOTE — Telephone Encounter (Signed)
Patient states since he had open heart surgery his pulse will beat 3-4 times and then stop.  He states he went to his PCP and he found the same thing.   Patient doesn't a log of his HR reading. He would like to speak to nurse to know what she thinks about it.

## 2021-10-20 NOTE — Telephone Encounter (Signed)
Patient made aware. Verbalized understanding. Nurse visit scheduled for Thursday 10/21/21 @ 9:00 for orthostatic vital signs and placement of 72 hr Zio XT, per SM.

## 2021-10-20 NOTE — Telephone Encounter (Signed)
States that when he stands up or is walking, he feels dizzy and like he is going to faint, which only lasts for a minute or so. Feels like his heart beats a few times and then stops. States that this is something that his PCP noticed but did not have any recommendations as to what he should do. States that he was feeling like this during last visit with Dr. Domenic Polite in July and that he thinks all of this started when he had open heart surgery in January. States that he thought the surgery was supposed to make him feel better but he feels worse. Does have some intermittent chest pains but unsure if its just some soreness. Denies sob Verified medications. Feels like eliquis is not good for him to take. Please advise

## 2021-10-21 ENCOUNTER — Ambulatory Visit: Payer: PPO | Attending: Cardiology

## 2021-10-21 ENCOUNTER — Telehealth: Payer: Self-pay | Admitting: Cardiology

## 2021-10-21 ENCOUNTER — Ambulatory Visit: Payer: PPO | Attending: Cardiology | Admitting: *Deleted

## 2021-10-21 DIAGNOSIS — I4819 Other persistent atrial fibrillation: Secondary | ICD-10-CM | POA: Diagnosis not present

## 2021-10-21 DIAGNOSIS — R42 Dizziness and giddiness: Secondary | ICD-10-CM

## 2021-10-21 DIAGNOSIS — R001 Bradycardia, unspecified: Secondary | ICD-10-CM | POA: Diagnosis not present

## 2021-10-21 NOTE — Progress Notes (Signed)
Patient notified and verbalized understanding. 

## 2021-10-21 NOTE — Telephone Encounter (Signed)
Pt calling requesting to speak to Laurine Blazer, LPN. Transferred.

## 2021-10-21 NOTE — Telephone Encounter (Signed)
Answered questions he had in regards to monitor that was placed this morning.

## 2021-10-21 NOTE — Progress Notes (Signed)
Patient in office for orthostatic BP's and monitor placement.  EKG done as well due to low HR readings on the pulse ox.    EKG captured HR at 50.

## 2021-10-22 ENCOUNTER — Other Ambulatory Visit: Payer: Self-pay | Admitting: Cardiology

## 2021-10-22 DIAGNOSIS — R42 Dizziness and giddiness: Secondary | ICD-10-CM

## 2021-10-22 DIAGNOSIS — R001 Bradycardia, unspecified: Secondary | ICD-10-CM

## 2021-10-28 DIAGNOSIS — E039 Hypothyroidism, unspecified: Secondary | ICD-10-CM | POA: Diagnosis not present

## 2021-10-28 DIAGNOSIS — I129 Hypertensive chronic kidney disease with stage 1 through stage 4 chronic kidney disease, or unspecified chronic kidney disease: Secondary | ICD-10-CM | POA: Diagnosis not present

## 2021-10-28 DIAGNOSIS — R7303 Prediabetes: Secondary | ICD-10-CM | POA: Diagnosis not present

## 2021-11-03 DIAGNOSIS — R42 Dizziness and giddiness: Secondary | ICD-10-CM | POA: Diagnosis not present

## 2021-11-03 DIAGNOSIS — R001 Bradycardia, unspecified: Secondary | ICD-10-CM | POA: Diagnosis not present

## 2021-11-04 ENCOUNTER — Other Ambulatory Visit (HOSPITAL_COMMUNITY): Payer: Self-pay | Admitting: Family Medicine

## 2021-11-04 ENCOUNTER — Ambulatory Visit (HOSPITAL_COMMUNITY)
Admission: RE | Admit: 2021-11-04 | Discharge: 2021-11-04 | Disposition: A | Discharge status: Home / Self Care | Payer: PPO | Source: Ambulatory Visit | Attending: Family Medicine | Admitting: Family Medicine

## 2021-11-04 DIAGNOSIS — M25512 Pain in left shoulder: Secondary | ICD-10-CM | POA: Diagnosis not present

## 2021-11-04 DIAGNOSIS — E782 Mixed hyperlipidemia: Secondary | ICD-10-CM | POA: Diagnosis not present

## 2021-11-04 DIAGNOSIS — N1832 Chronic kidney disease, stage 3b: Secondary | ICD-10-CM | POA: Diagnosis not present

## 2021-11-04 DIAGNOSIS — M19012 Primary osteoarthritis, left shoulder: Secondary | ICD-10-CM | POA: Diagnosis not present

## 2021-11-04 DIAGNOSIS — M25511 Pain in right shoulder: Secondary | ICD-10-CM | POA: Diagnosis not present

## 2021-11-04 DIAGNOSIS — I2510 Atherosclerotic heart disease of native coronary artery without angina pectoris: Secondary | ICD-10-CM | POA: Diagnosis not present

## 2021-11-04 DIAGNOSIS — R0609 Other forms of dyspnea: Secondary | ICD-10-CM | POA: Diagnosis not present

## 2021-11-04 DIAGNOSIS — I700 Atherosclerosis of aorta: Secondary | ICD-10-CM | POA: Diagnosis not present

## 2021-11-04 DIAGNOSIS — R7303 Prediabetes: Secondary | ICD-10-CM | POA: Diagnosis not present

## 2021-11-04 DIAGNOSIS — I129 Hypertensive chronic kidney disease with stage 1 through stage 4 chronic kidney disease, or unspecified chronic kidney disease: Secondary | ICD-10-CM | POA: Diagnosis not present

## 2021-11-04 DIAGNOSIS — R2232 Localized swelling, mass and lump, left upper limb: Secondary | ICD-10-CM | POA: Diagnosis not present

## 2021-11-04 DIAGNOSIS — Z8546 Personal history of malignant neoplasm of prostate: Secondary | ICD-10-CM | POA: Diagnosis not present

## 2021-11-04 DIAGNOSIS — D6869 Other thrombophilia: Secondary | ICD-10-CM | POA: Diagnosis not present

## 2021-11-04 DIAGNOSIS — Z951 Presence of aortocoronary bypass graft: Secondary | ICD-10-CM | POA: Diagnosis not present

## 2021-11-08 DIAGNOSIS — Z23 Encounter for immunization: Secondary | ICD-10-CM | POA: Diagnosis not present

## 2021-11-09 ENCOUNTER — Other Ambulatory Visit: Payer: Self-pay | Admitting: *Deleted

## 2021-11-09 DIAGNOSIS — I25119 Atherosclerotic heart disease of native coronary artery with unspecified angina pectoris: Secondary | ICD-10-CM

## 2021-11-10 ENCOUNTER — Ambulatory Visit: Payer: PPO | Attending: Cardiology

## 2021-11-10 DIAGNOSIS — I25119 Atherosclerotic heart disease of native coronary artery with unspecified angina pectoris: Secondary | ICD-10-CM | POA: Diagnosis not present

## 2021-11-10 LAB — ECHOCARDIOGRAM COMPLETE
AR max vel: 1.75 cm2
AV Area VTI: 1.77 cm2
AV Area mean vel: 1.8 cm2
AV Mean grad: 8 mmHg
AV Peak grad: 13.9 mmHg
AV Vena cont: 0.22 cm
Ao pk vel: 1.87 m/s
Area-P 1/2: 3.06 cm2
Calc EF: 62.2 %
S' Lateral: 2.89 cm
Single Plane A2C EF: 66.8 %
Single Plane A4C EF: 59.2 %

## 2021-11-10 MED ORDER — PERFLUTREN LIPID MICROSPHERE
1.0000 mL | INTRAVENOUS | Status: AC | PRN
  Administered 2021-11-10: 2 mL via INTRAVENOUS

## 2021-11-11 ENCOUNTER — Telehealth: Payer: Self-pay | Admitting: *Deleted

## 2021-11-11 DIAGNOSIS — I493 Ventricular premature depolarization: Secondary | ICD-10-CM

## 2021-11-11 NOTE — Telephone Encounter (Signed)
-----   Message from Satira Sark, MD sent at 11/10/2021  4:35 PM EDT ----- Results reviewed.  Overall LVEF remains normal at 55 to 60%.  There is a small region of scar in the apical anterolateral wall evident by Definity contrast imaging that would be consistent with known diagnosis of underlying ischemic heart disease.  Otherwise trivial mitral regurgitation and moderately calcified aortic valve without stenosis.  Ascending aorta is mildly dilated.  He did have frequent PVCs by recent cardiac monitor and evidence of underlying conduction system disease, currently not on any AV nodal blocker.  If he continues to be bothered by persistent palpitations or intermittent dizziness we could refer him for formal EP consultation.

## 2021-11-11 NOTE — Telephone Encounter (Signed)
Patient informed and verbalized understanding of plan. Says he continues to bothered with palpitations and dizziness EP referral placed

## 2021-11-23 ENCOUNTER — Encounter: Payer: Self-pay | Admitting: *Deleted

## 2021-11-23 ENCOUNTER — Encounter: Payer: Self-pay | Admitting: Internal Medicine

## 2021-11-23 ENCOUNTER — Ambulatory Visit: Payer: PPO | Attending: Internal Medicine | Admitting: Internal Medicine

## 2021-11-23 VITALS — BP 118/60 | HR 62 | Ht 74.0 in | Wt 247.0 lb

## 2021-11-23 DIAGNOSIS — Z01818 Encounter for other preprocedural examination: Secondary | ICD-10-CM

## 2021-11-23 NOTE — Progress Notes (Signed)
HPI Mr. Leffler is referred for evaluation of symptomatic bradycardia. He is a pleasant 81 yo man with CAD, s/p CABG in 1/23. He has worn a cardiac monitor demonstrating PVC's and NSVT but daytime pauses of 3.7 seconds with near syncope and transient HR's in the low 20's. He feels poorly He had about 8% PVC's. He denies anginal symptoms.  No Known Allergies   Current Outpatient Medications  Medication Sig Dispense Refill   amLODipine (NORVASC) 5 MG tablet Take 1 tablet (5 mg total) by mouth daily. 30 tablet 6   apixaban (ELIQUIS) 2.5 MG TABS tablet TAKE ONE TABLET (2.'5MG'$  TOTAL) BY MOUTH TWO TIMES DAILY 60 tablet 5   aspirin EC 81 MG tablet Take 81 mg by mouth in the morning. Swallow whole.     atorvastatin (LIPITOR) 20 MG tablet Take 1 tablet (20 mg total) by mouth daily. (Patient taking differently: Take 20 mg by mouth every evening.) 90 tablet 3   ezetimibe (ZETIA) 10 MG tablet Take 10 mg by mouth daily.     No current facility-administered medications for this visit.     Past Medical History:  Diagnosis Date   Arthritis    CAD (coronary artery disease)    Two-vessel obstructive disease November 2022; CABG with LIMA to LAD and SVG to PDA January 2023   Chronic back pain    CKD (chronic kidney disease) stage 3, GFR 30-59 ml/min (HCC)    Essential hypertension    GERD (gastroesophageal reflux disease)    Helicobacter pylori gastritis    Treated   History of kidney stones    Hx of adenomatous colonic polyps    IBS (irritable bowel syndrome)    PAF (paroxysmal atrial fibrillation) (HCC)    Prostate CA (HCC)    Seed implants   Schatzki's ring    Stroke ([place])     ROS:   All systems reviewed and negative except as noted in the HPI.   Past Surgical History:  Procedure Laterality Date   APPENDECTOMY  age 81   BACK SURGERY  02/08   [facility]   BACK SURGERY  09/24/04   lumbar, [facility]   BACK SURGERY  09/18/02   [facility]   BILIARY STENT PLACEMENT  01/07/2012   Procedure: BILIARY  STENT PLACEMENT;  Surgeon: Rogene Houston, MD;  [facility];  Service: Endoscopy;  Laterality: N/A;   BILIARY STENT PLACEMENT  02/21/2012   Procedure: BILIARY STENT PLACEMENT;  Surgeon: Rogene Houston, MD;  [facility]: [facility];  Service: Endoscopy;  Laterality: N/A;  Biliary Stent Replacement   CATARACT EXTRACTION W/PHACO  11/22/2010   Procedure: CATARACT EXTRACTION PHACO AND INTRAOCULAR LENS PLACEMENT ([place]);  Surgeon: Williams Che;  Location: [facility];  Service: Ophthalmology;  Laterality: Right;  CDE: 6.81   CHOLECYSTECTOMY  03/04/05   [facility], [facility]. Gangrenous cholecystitis complicated by abscess requiring percutaneous drainage. He also had common duct stones requiring ERCP and sphincterotomy.   COLONOSCOPY  August 2005   Scattered sigmoid diverticulosis, splenic flexure polyp. Hyperplastic   COLONOSCOPY  1997   3 cm tubular adenoma and a sending colon   COLONOSCOPY  02/01/2011   Rourk-friable anal canal, hyperplastic rectal polyp   COLONOSCOPY N/A 08/15/2013   Procedure: COLONOSCOPY;  Surgeon: Rogene Houston, MD;  [facility];  Service: Endoscopy;  Laterality: N/A;  100   COLONOSCOPY N/A 09/20/2018   Procedure: COLONOSCOPY;  Surgeon: Rogene Houston, MD;  [facility];  Service: Endoscopy;  Laterality: N/A;  12:00pm   CORONARY ARTERY BYPASS GRAFT N/A 03/09/2021   Procedure: OFF PUMP CORONARY ARTERY BYPASS GRAFTING (CABG) X2, USING LEFT INTERNAL MAMMARY ARTERY AND RIGHT LEG GREATER SAPHENOUS VEIN HARVESTED ENDOSCOPICALLY;  Surgeon: Lajuana Matte, MD;  Location: [place];  Service: Open Heart Surgery;  Laterality: N/A;  X 2   ERCP  03/02/05   [facility], [facility]. Normal-appearing biliary tree (gallbladder not image), status post sphincterotomy with recovery of small pieces of stone material, status post balloon occlusion cholangiogram.   ERCP  01/07/2012   Procedure: ENDOSCOPIC RETROGRADE CHOLANGIOPANCREATOGRAPHY (ERCP);  Surgeon: Rogene Houston, MD;  [facility];   Service: Endoscopy;  Laterality: N/A;  possible biliary stenting   ERCP  02/21/2012   Procedure: ENDOSCOPIC RETROGRADE CHOLANGIOPANCREATOGRAPHY (ERCP);  Surgeon: Rogene Houston, MD;  [facility];  Service: Endoscopy;  Laterality: N/A;  common bile duct stone and clip removed   ERCP N/A 05/18/2012   Procedure: ENDOSCOPIC RETROGRADE CHOLANGIOPANCREATOGRAPHY (ERCP);  Surgeon: Rogene Houston, MD;  [facility];  Service: Endoscopy;  Laterality: N/A;  stone and debri removal   ESOPHAGOGASTRODUODENOSCOPY  August 2005   Erosive esophagitis and Schatzki ring, small hiatal hernia   ESOPHAGOGASTRODUODENOSCOPY  01/2008   Mild reflux esophagitis, small hiatal hernia   ESOPHAGOGASTRODUODENOSCOPY  02/01/2011   Rourk-erosive reflux esophagitis, small hiatal hernia   ESOPHAGOGASTRODUODENOSCOPY  10/26/2011   Dr. Eli Phillips gastric submucosal petechia (bx-benign ulceration), juxta ampullary duodenal diverticulum and some mucosal edema involving 1st/2nd portion of duodenum with superficial erosions (bx-superficial ulceration/benign)   ESOPHAGOGASTRODUODENOSCOPY  02/21/2012   Procedure: ESOPHAGOGASTRODUODENOSCOPY (EGD);  Surgeon: Rogene Houston, MD;  [facility];  Service: Endoscopy;  Laterality: N/A;   EYE SURGERY     Incomplete colonoscopy  December 2009   Left-sided diverticula, mid descending colon polyp, due to recurrent looping and redundancy exam was incomplete. It was felt that the mid colon was reached. Followup barium enema showed colon interposition between the liver and the diaphragm, redundant sigmoid colon but no colon mass or polyp identified.. Pathology revealed tubular adenoma.   LEFT HEART CATH AND CORONARY ANGIOGRAPHY N/A 01/18/2021   Procedure: LEFT HEART CATH AND CORONARY ANGIOGRAPHY;  Surgeon: Lorretta Harp, MD;  Location: [place] [facility];  Service: Cardiovascular;  Laterality: N/A;   POLYPECTOMY  09/20/2018   Procedure: POLYPECTOMY;  Surgeon: Rogene Houston, MD;   [facility];  Service: Endoscopy;;  Hepatic flexure polyp cold snare, splenic flexure polyp   SPHINCTEROTOMY  01/07/2012   Procedure: SPHINCTEROTOMY;  Surgeon: Rogene Houston, MD;  [facility];  Service: Endoscopy;  Laterality: N/A;  Extended   SPYGLASS CHOLANGIOSCOPY  02/21/2012   Procedure: SPYGLASS CHOLANGIOSCOPY;  Surgeon: Rogene Houston, MD;  [facility];  Service: Endoscopy;  Laterality: N/A;   SPYGLASS CHOLANGIOSCOPY N/A 05/18/2012   Procedure: SPYGLASS CHOLANGIOSCOPY;  Surgeon: Rogene Houston, MD;  [facility];  Service: Endoscopy;  Laterality: N/A;   TEE WITHOUT CARDIOVERSION N/A 03/09/2021   Procedure: TRANSESOPHAGEAL ECHOCARDIOGRAM (TEE);  Surgeon: Lajuana Matte, MD;  Location: [place];  Service: Open Heart Surgery;  Laterality: N/A;   TOTAL KNEE ARTHROPLASTY  03/21/2012   Procedure: TOTAL KNEE ARTHROPLASTY;  Surgeon: [name], MD;  Location: [place];  Service: Orthopedics;  Laterality: Right;  right knee arthroplasty   TOTAL KNEE ARTHROPLASTY Left 04/08/2019   Procedure: LEFT TOTAL KNEE ARTHROPLASTY;  Surgeon: Frederik Pear, MD;  [facility];  Service: Orthopedics;  Laterality: Left;  Family History  Problem Relation Age of Onset   Diabetes Mother    Coronary artery disease Mother    Coronary artery disease Father    Diabetes Father    Stomach cancer Father    Deep vein thrombosis Daughter    Hypotension Neg Hx    Anesthesia problems Neg Hx    Malignant hyperthermia Neg Hx    Pseudochol deficiency Neg Hx    Colon cancer Neg Hx      Social History   Socioeconomic History   Marital status: Married    Spouse name: Not on file   Number of children: 2   Years of education: Not on file   Highest education level: Not on file  Occupational History   Occupation: Retired Chemical engineer: RETIRED  Tobacco Use   Smoking status: Never   Smokeless tobacco: Never  Vaping Use   Vaping Use: Never used  Substance and Sexual  Activity   Alcohol use: No    Alcohol/week: 0.0 standard drinks of alcohol   Drug use: No   Sexual activity: Not on file  Other Topics Concern   Not on file  Social History Narrative   Not on file   Social Determinants of Health   Financial Resource Strain: Not on file  Food Insecurity: Not on file  Transportation Needs: Not on file  Physical Activity: Not on file  Stress: Not on file  Social Connections: Not on file  Intimate Partner Violence: Not on file     BP 118/60   Pulse 62   Ht '6\' 2"'$  (1.88 m)   Wt 247 lb (112 kg)   SpO2 96%   BMI 31.71 kg/m   Physical Exam:  Well appearing NAD HEENT: Unremarkable Neck:  No JVD, no thyromegally Lymphatics:  No adenopathy Back:  No CVA tenderness Lungs:  Clear HEART:  Regular rate rhythm, no murmurs, no rubs, no clicks Abd:  soft, positive bowel sounds, no organomegally, no rebound, no guarding Ext:  2 plus pulses, no edema, no cyanosis, no clubbing Skin:  No rashes no nodules Neuro:  CN II through XII intact, motor grossly intact  Assess/Plan:  Symptomatic sinus node dysfunction - he has had daytime pauses and is symptomatic and is not on any AV nodal blocking drugs. I have recommended proceeding with DDD PM insertion.  2. PVC's - I suspect that he would benefit from a beta blocker after his PPM is inserted.  3. CAD - he is s/p CABG and denies anginal symptoms. We will follow.   Carleene Overlie Janey Petron,MD

## 2021-11-23 NOTE — H&P (View-Only) (Signed)
HPI [name] is referred for evaluation of symptomatic bradycardia. He is a pleasant 81 yo man with CAD, s/p CABG in 1/23. He has worn a cardiac monitor demonstrating PVC's and NSVT but daytime pauses of 3.7 seconds with near syncope and transient HR's in the low 20's. He feels poorly He had about 8% PVC's. He denies anginal symptoms.  No Known Allergies   Current Outpatient Medications  Medication Sig Dispense Refill   amLODipine (NORVASC) 5 MG tablet Take 1 tablet (5 mg total) by mouth daily. 30 tablet 6   apixaban (ELIQUIS) 2.5 MG TABS tablet TAKE ONE TABLET (2.'5MG'$  TOTAL) BY MOUTH TWO TIMES DAILY 60 tablet 5   aspirin EC 81 MG tablet Take 81 mg by mouth in the morning. Swallow whole.     atorvastatin (LIPITOR) 20 MG tablet Take 1 tablet (20 mg total) by mouth daily. (Patient taking differently: Take 20 mg by mouth every evening.) 90 tablet 3   ezetimibe (ZETIA) 10 MG tablet Take 10 mg by mouth daily.     No current facility-administered medications for this visit.     Past Medical History:  Diagnosis Date   Arthritis    CAD (coronary artery disease)    Two-vessel obstructive disease November 2022; CABG with LIMA to LAD and SVG to PDA January 2023   Chronic back pain    CKD (chronic kidney disease) stage 3, GFR 30-59 ml/min (HCC)    Essential hypertension    GERD (gastroesophageal reflux disease)    Helicobacter pylori gastritis    Treated   History of kidney stones    Hx of adenomatous colonic polyps    IBS (irritable bowel syndrome)    PAF (paroxysmal atrial fibrillation) (HCC)    Prostate CA (HCC)    Seed implants   Schatzki's ring    Stroke ([place])     ROS:   All systems reviewed and negative except as noted in the HPI.   Past Surgical History:  Procedure Laterality Date   APPENDECTOMY  age 31   BACK SURGERY  02/08   [facility]   BACK SURGERY  09/24/04   lumbar, [facility]   BACK SURGERY  09/18/02   [facility]   BILIARY STENT PLACEMENT  01/07/2012   Procedure: BILIARY  STENT PLACEMENT;  Surgeon: Rogene Houston, MD;  [facility];  Service: Endoscopy;  Laterality: N/A;   BILIARY STENT PLACEMENT  02/21/2012   Procedure: BILIARY STENT PLACEMENT;  Surgeon: Rogene Houston, MD;  [facility];  Service: Endoscopy;  Laterality: N/A;  Biliary Stent Replacement   CATARACT EXTRACTION W/PHACO  11/22/2010   Procedure: CATARACT EXTRACTION PHACO AND INTRAOCULAR LENS PLACEMENT ([place]);  Surgeon: Williams Che;  [facility];  Service: Ophthalmology;  Laterality: Right;  CDE: 6.81   CHOLECYSTECTOMY  03/04/05   [facility], [facility]. Gangrenous cholecystitis complicated by abscess requiring percutaneous drainage. He also had common duct stones requiring ERCP and sphincterotomy.   COLONOSCOPY  August 2005   Scattered sigmoid diverticulosis, splenic flexure polyp. Hyperplastic   COLONOSCOPY  1997   3 cm tubular adenoma and a sending colon   COLONOSCOPY  02/01/2011   Rourk-friable anal canal, hyperplastic rectal polyp   COLONOSCOPY N/A 08/15/2013   Procedure: COLONOSCOPY;  Surgeon: Rogene Houston, MD;  [facility];  Service: Endoscopy;  Laterality: N/A;  100   COLONOSCOPY N/A 09/20/2018   Procedure: COLONOSCOPY;  Surgeon: Rogene Houston, MD;  [facility];  Service: Endoscopy;  Laterality: N/A;  12:00pm   CORONARY ARTERY BYPASS GRAFT N/A 03/09/2021   Procedure: OFF PUMP CORONARY ARTERY BYPASS GRAFTING (CABG) X2, USING LEFT INTERNAL MAMMARY ARTERY AND RIGHT LEG GREATER SAPHENOUS VEIN HARVESTED ENDOSCOPICALLY;  Surgeon: Lajuana Matte, MD;  Location: [place];  Service: Open Heart Surgery;  Laterality: N/A;  X 2   ERCP  03/02/05   [facility], R[facility]. Normal-appearing biliary tree (gallbladder not image), status post sphincterotomy with recovery of small pieces of stone material, status post balloon occlusion cholangiogram.   ERCP  01/07/2012   Procedure: ENDOSCOPIC RETROGRADE CHOLANGIOPANCREATOGRAPHY (ERCP);  Surgeon: Rogene Houston, MD;  [facility];   Service: Endoscopy;  Laterality: N/A;  possible biliary stenting   ERCP  02/21/2012   Procedure: ENDOSCOPIC RETROGRADE CHOLANGIOPANCREATOGRAPHY (ERCP);  Surgeon: Rogene Houston, MD;  [facility];  Service: Endoscopy;  Laterality: N/A;  common bile duct stone and clip removed   ERCP N/A 05/18/2012   Procedure: ENDOSCOPIC RETROGRADE CHOLANGIOPANCREATOGRAPHY (ERCP);  Surgeon: Rogene Houston, MD;  Location[facility];  Service: Endoscopy;  Laterality: N/A;  stone and debri removal   ESOPHAGOGASTRODUODENOSCOPY  August 2005   Erosive esophagitis and Schatzki ring, small hiatal hernia   ESOPHAGOGASTRODUODENOSCOPY  01/2008   Mild reflux esophagitis, small hiatal hernia   ESOPHAGOGASTRODUODENOSCOPY  02/01/2011   Rourk-erosive reflux esophagitis, small hiatal hernia   ESOPHAGOGASTRODUODENOSCOPY  10/26/2011   Dr. Eli Phillips gastric submucosal petechia (bx-benign ulceration), juxta ampullary duodenal diverticulum and some mucosal edema involving 1st/2nd portion of duodenum with superficial erosions (bx-superficial ulceration/benign)   ESOPHAGOGASTRODUODENOSCOPY  02/21/2012   Procedure: ESOPHAGOGASTRODUODENOSCOPY (EGD);  Surgeon: Rogene Houston, MD;  [facility];  Service: Endoscopy;  Laterality: N/A;   EYE SURGERY     Incomplete colonoscopy  December 2009   Left-sided diverticula, mid descending colon polyp, due to recurrent looping and redundancy exam was incomplete. It was felt that the mid colon was reached. Followup barium enema showed colon interposition between the liver and the diaphragm, redundant sigmoid colon but no colon mass or polyp identified.. Pathology revealed tubular adenoma.   LEFT HEART CATH AND CORONARY ANGIOGRAPHY N/A 01/18/2021   Procedure: LEFT HEART CATH AND CORONARY ANGIOGRAPHY;  Surgeon: Lorretta Harp, MD;  Location: [place] [facility];  Service: Cardiovascular;  Laterality: N/A;   POLYPECTOMY  09/20/2018   Procedure: POLYPECTOMY;  Surgeon: Rogene Houston, MD;   [facility];  Service: Endoscopy;;  Hepatic flexure polyp cold snare, splenic flexure polyp   SPHINCTEROTOMY  01/07/2012   Procedure: SPHINCTEROTOMY;  Surgeon: Rogene Houston, MD;  [facility];  Service: Endoscopy;  Laterality: N/A;  Extended   SPYGLASS CHOLANGIOSCOPY  02/21/2012   Procedure: SPYGLASS CHOLANGIOSCOPY;  Surgeon: Rogene Houston, MD;  [facility];  Service: Endoscopy;  Laterality: N/A;   SPYGLASS CHOLANGIOSCOPY N/A 05/18/2012   Procedure: SPYGLASS CHOLANGIOSCOPY;  Surgeon: Rogene Houston, MD;  [facility];  Service: Endoscopy;  Laterality: N/A;   TEE WITHOUT CARDIOVERSION N/A 03/09/2021   Procedure: TRANSESOPHAGEAL ECHOCARDIOGRAM (TEE);  Surgeon: Lajuana Matte, MD;  Location: [place];  Service: Open Heart Surgery;  Laterality: N/A;   TOTAL KNEE ARTHROPLASTY  03/21/2012   Procedure: TOTAL KNEE ARTHROPLASTY;  Surgeon: [name], MD;  Location: [place];  Service: Orthopedics;  Laterality: Right;  right knee arthroplasty   TOTAL KNEE ARTHROPLASTY Left 04/08/2019   Procedure: LEFT TOTAL KNEE ARTHROPLASTY;  Surgeon: Frederik Pear, MD;  [facility];  Service: Orthopedics;  Laterality: Left;  Family History  Problem Relation Age of Onset   Diabetes Mother    Coronary artery disease Mother    Coronary artery disease Father    Diabetes Father    Stomach cancer Father    Deep vein thrombosis Daughter    Hypotension Neg Hx    Anesthesia problems Neg Hx    Malignant hyperthermia Neg Hx    Pseudochol deficiency Neg Hx    Colon cancer Neg Hx      Social History   Socioeconomic History   Marital status: Married    Spouse name: Not on file   Number of children: 2   Years of education: Not on file   Highest education level: Not on file  Occupational History   Occupation: Retired Chemical engineer: RETIRED  Tobacco Use   Smoking status: Never   Smokeless tobacco: Never  Vaping Use   Vaping Use: Never used  Substance and Sexual  Activity   Alcohol use: No    Alcohol/week: 0.0 standard drinks of alcohol   Drug use: No   Sexual activity: Not on file  Other Topics Concern   Not on file  Social History Narrative   Not on file   Social Determinants of Health   Financial Resource Strain: Not on file  Food Insecurity: Not on file  Transportation Needs: Not on file  Physical Activity: Not on file  Stress: Not on file  Social Connections: Not on file  Intimate Partner Violence: Not on file     BP 118/60   Pulse 62   Ht '6\' 2"'$  (1.88 m)   Wt 247 lb (112 kg)   SpO2 96%   BMI 31.71 kg/m   Physical Exam:  Well appearing NAD HEENT: Unremarkable Neck:  No JVD, no thyromegally Lymphatics:  No adenopathy Back:  No CVA tenderness Lungs:  Clear HEART:  Regular rate rhythm, no murmurs, no rubs, no clicks Abd:  soft, positive bowel sounds, no organomegally, no rebound, no guarding Ext:  2 plus pulses, no edema, no cyanosis, no clubbing Skin:  No rashes no nodules Neuro:  CN II through XII intact, motor grossly intact  Assess/Plan:  Symptomatic sinus node dysfunction - he has had daytime pauses and is symptomatic and is not on any AV nodal blocking drugs. I have recommended proceeding with DDD PM insertion.  2. PVC's - I suspect that he would benefit from a beta blocker after his PPM is inserted.  3. CAD - he is s/p CABG and denies anginal symptoms. We will follow.   Carleene Overlie Vernia Teem,MD

## 2021-11-23 NOTE — Patient Instructions (Signed)
Medication Instructions:   Your physician recommends that you continue on your current medications as directed. Please refer to the Current Medication list given to you today.  Hold Eliquis 2 Days prior to your procedure   *If you need a refill on your cardiac medications before your next appointment, please call your pharmacy*   Lab Work: Your physician recommends that you return for lab work in: [place], BMET   If you have labs (blood work) drawn today and your tests are completely normal, you will receive your results only by: [place] (if you have [place]) OR A paper copy in the mail If you have any lab test that is abnormal or we need to change your treatment, we will call you to review the results.   Testing/Procedures: Your physician has recommended that you have a pacemaker inserted. A pacemaker is a small device that is placed under the skin of your chest or abdomen to help control abnormal heart rhythms. This device uses electrical pulses to prompt the heart to beat at a normal rate. Pacemakers are used to treat heart rhythms that are too slow. Wire (leads) are attached to the pacemaker that goes into the chambers of you heart. This is done in the hospital and usually requires and overnight stay. Please see the instruction sheet given to you today for more information.    Follow-Up: At [facility], you and your health needs are our priority.  As part of our continuing mission to provide you with exceptional heart care, we have created designated Provider Care Teams.  These Care Teams include your primary Cardiologist (physician) and Advanced Practice Providers (APPs -  Physician Assistants and Nurse Practitioners) who all work together to provide you with the care you need, when you need it.  We recommend signing up for the patient portal called "MyChart".  Sign up information is provided on this After Visit Summary.  MyChart is used to connect with patients for  Virtual Visits (Telemedicine).  Patients are able to view lab/test results, encounter notes, upcoming appointments, etc.  Non-urgent messages can be sent to your provider as well.   To learn more about what you can do with MyChart, go to [email or web address].    Your next appointment:   3 month(s)  The format for your next appointment:   In Person  Provider:   Cristopher Peru, MD    Other Instructions Thank you for choosing [facility]!    Important Information About Sugar

## 2021-11-24 NOTE — Addendum Note (Signed)
Addended by: Barbarann Ehlers A on: 11/24/2021 08:44 AM   Modules accepted: Orders

## 2021-11-29 ENCOUNTER — Other Ambulatory Visit (HOSPITAL_COMMUNITY)
Admission: RE | Admit: 2021-11-29 | Discharge: 2021-11-29 | Disposition: A | Discharge status: Home / Self Care | Payer: PPO | Source: Ambulatory Visit | Attending: Internal Medicine | Admitting: Internal Medicine

## 2021-11-29 DIAGNOSIS — Z01818 Encounter for other preprocedural examination: Secondary | ICD-10-CM | POA: Diagnosis not present

## 2021-11-29 LAB — BASIC METABOLIC PANEL
Anion gap: 10 (ref 5–15)
BUN: 35 mg/dL — ABNORMAL HIGH (ref 8–23)
CO2: 26 mmol/L (ref 22–32)
Calcium: 9.2 mg/dL (ref 8.9–10.3)
Chloride: 101 mmol/L (ref 98–111)
Creatinine, Ser: 1.73 mg/dL — ABNORMAL HIGH (ref 0.61–1.24)
GFR, Estimated: 39 mL/min — ABNORMAL LOW (ref >60)
Glucose, Bld: 105 mg/dL — ABNORMAL HIGH (ref 70–99)
Potassium: 4.2 mmol/L (ref 3.5–5.1)
Sodium: 137 mmol/L (ref 135–145)

## 2021-11-29 LAB — CBC
HCT: 46.1 % (ref 39.0–52.0)
Hemoglobin: 15.1 g/dL (ref 13.0–17.0)
MCH: 31.7 pg (ref 26.0–34.0)
MCHC: 32.8 g/dL (ref 30.0–36.0)
MCV: 96.8 fL (ref 80.0–100.0)
Platelets: 179 10*3/uL (ref 150–400)
RBC: 4.76 MIL/uL (ref 4.22–5.81)
RDW: 13.1 % (ref 11.5–15.5)
WBC: 6.2 10*3/uL (ref 4.0–10.5)
nRBC: 0 % (ref 0.0–0.2)

## 2021-12-02 ENCOUNTER — Telehealth: Payer: Self-pay | Admitting: Cardiology

## 2021-12-02 DIAGNOSIS — I4819 Other persistent atrial fibrillation: Secondary | ICD-10-CM

## 2021-12-02 NOTE — Telephone Encounter (Signed)
Pt came in office to see if he can get more samples of Eliquis 2.5 mg

## 2021-12-02 NOTE — Telephone Encounter (Signed)
Sample request for Eliquis received. Indication: Afib  Last office visit: 11/23/21 Lovena Le) Scr: 1.73 (11/29/21) Age: 81 Weight: 112kg  Eliquis 2.5 mg BID is appropriate dose.

## 2021-12-02 NOTE — Telephone Encounter (Signed)
Pt came in office to see if he could get an appointment w/Dr. Domenic Polite before his surgery 12/22/21. He is having a pacemaker put in. There are no available appointments until January at this time. Pt is wanting Dr. Myles Gip advice on it.

## 2021-12-02 NOTE — Telephone Encounter (Signed)
Patient wanted to make sure that Dr.McDowell would agree with him getting a pacemaker. I assured him that when Dr.McDowell referred him to EP that whatever Dr.taylor felt he needed.Patient was appeased by that.

## 2021-12-02 NOTE — Telephone Encounter (Signed)
We do not have any 2.5 mg samples.Patient will pick up two boxes from the [place] office on Monday 10/16

## 2021-12-03 MED ORDER — APIXABAN 2.5 MG PO TABS: ORAL_TABLET | ORAL | 0 refills | Status: DC

## 2021-12-03 NOTE — Addendum Note (Signed)
Addended by: Merlene Laughter on: 12/03/2021 01:07 PM   Modules accepted: Orders

## 2021-12-03 NOTE — Telephone Encounter (Signed)
[place] contacted to check co-pay price and is $40/month Says he may not be on eliquis after having PPM placed and didn't want to do patient assistance at this time

## 2021-12-15 ENCOUNTER — Ambulatory Visit: Payer: PPO

## 2021-12-21 NOTE — Pre-Procedure Instructions (Signed)
Instructed patient on the following items: Arrival time 0830 Nothing to eat or drink after midnight No meds AM of procedure Responsible person to drive you home and stay with you for 24 hrs Wash with special soap night before and morning of procedure If on anti-coagulant drug instructions Elqiuis- last dose 10/29

## 2021-12-22 ENCOUNTER — Other Ambulatory Visit: Payer: Self-pay

## 2021-12-22 ENCOUNTER — Encounter (HOSPITAL_COMMUNITY)
Admission: RE | Disposition: A | Discharge status: Home / Self Care | Payer: Self-pay | Source: Home / Self Care | Attending: Internal Medicine

## 2021-12-22 ENCOUNTER — Ambulatory Visit (HOSPITAL_COMMUNITY): Payer: PPO

## 2021-12-22 ENCOUNTER — Ambulatory Visit (HOSPITAL_COMMUNITY)
Admission: RE | Admit: 2021-12-22 | Discharge: 2021-12-22 | Disposition: A | Discharge status: Home / Self Care | Payer: PPO | Attending: Internal Medicine | Admitting: Internal Medicine

## 2021-12-22 DIAGNOSIS — Z951 Presence of aortocoronary bypass graft: Secondary | ICD-10-CM | POA: Diagnosis not present

## 2021-12-22 DIAGNOSIS — Z950 Presence of cardiac pacemaker: Secondary | ICD-10-CM | POA: Diagnosis not present

## 2021-12-22 DIAGNOSIS — I495 Sick sinus syndrome: Secondary | ICD-10-CM | POA: Insufficient documentation

## 2021-12-22 DIAGNOSIS — I2510 Atherosclerotic heart disease of native coronary artery without angina pectoris: Secondary | ICD-10-CM | POA: Diagnosis not present

## 2021-12-22 DIAGNOSIS — R079 Chest pain, unspecified: Secondary | ICD-10-CM | POA: Diagnosis not present

## 2021-12-22 HISTORY — PX: PACEMAKER IMPLANT: EP1218

## 2021-12-22 SURGERY — PACEMAKER IMPLANT

## 2021-12-22 MED ORDER — CEFAZOLIN SODIUM-DEXTROSE 2-4 GM/100ML-% IV SOLN
2.0000 g | INTRAVENOUS | Status: AC
  Administered 2021-12-22: 2 g via INTRAVENOUS

## 2021-12-22 MED ORDER — LIDOCAINE HCL (PF) 1 % IJ SOLN
INTRAMUSCULAR | Status: DC | PRN
  Administered 2021-12-22: 60 mL

## 2021-12-22 MED ORDER — CEFAZOLIN SODIUM-DEXTROSE 1-4 GM/50ML-% IV SOLN
1.0000 g | Freq: Once | INTRAVENOUS | Status: AC
  Administered 2021-12-22: 1 g via INTRAVENOUS
  Filled 2021-12-22: qty 50

## 2021-12-22 MED ORDER — IODIXANOL 320 MG/ML IV SOLN
INTRAVENOUS | Status: DC | PRN
  Administered 2021-12-22: 10 mL

## 2021-12-22 MED ORDER — POVIDONE-IODINE 10 % EX SWAB: 2.0000 | Freq: Once | CUTANEOUS | Status: DC

## 2021-12-22 MED ORDER — ONDANSETRON HCL 4 MG/2ML IJ SOLN: 4.0000 mg | Freq: Four times a day (QID) | INTRAMUSCULAR | Status: DC | PRN

## 2021-12-22 MED ORDER — ACETAMINOPHEN 325 MG PO TABS: 325.0000 mg | ORAL_TABLET | ORAL | Status: DC | PRN

## 2021-12-22 MED ORDER — MIDAZOLAM HCL 5 MG/5ML IJ SOLN
INTRAMUSCULAR | Status: DC | PRN
  Administered 2021-12-22 (×3): 1 mg via INTRAVENOUS

## 2021-12-22 MED ORDER — MIDAZOLAM HCL 5 MG/5ML IJ SOLN
INTRAMUSCULAR | Status: AC
  Filled 2021-12-22: qty 5

## 2021-12-22 MED ORDER — CHLORHEXIDINE GLUCONATE 4 % EX LIQD
4.0000 | Freq: Once | CUTANEOUS | Status: DC
  Filled 2021-12-22: qty 60

## 2021-12-22 MED ORDER — FENTANYL CITRATE (PF) 100 MCG/2ML IJ SOLN
INTRAMUSCULAR | Status: AC
  Filled 2021-12-22: qty 2

## 2021-12-22 MED ORDER — SODIUM CHLORIDE 0.9 % IV SOLN: INTRAVENOUS | Status: DC

## 2021-12-22 MED ORDER — HEPARIN (PORCINE) IN NACL 1000-0.9 UT/500ML-% IV SOLN
INTRAVENOUS | Status: DC | PRN
  Administered 2021-12-22: 500 mL

## 2021-12-22 MED ORDER — CEFAZOLIN SODIUM-DEXTROSE 2-4 GM/100ML-% IV SOLN
INTRAVENOUS | Status: AC
  Filled 2021-12-22: qty 100

## 2021-12-22 MED ORDER — SODIUM CHLORIDE 0.9 % IV SOLN
INTRAVENOUS | Status: AC
  Filled 2021-12-22: qty 2

## 2021-12-22 MED ORDER — SODIUM CHLORIDE 0.9 % IV SOLN
80.0000 mg | INTRAVENOUS | Status: AC
  Administered 2021-12-22: 80 mg

## 2021-12-22 MED ORDER — LIDOCAINE HCL (PF) 1 % IJ SOLN
INTRAMUSCULAR | Status: AC
  Filled 2021-12-22: qty 60

## 2021-12-22 MED ORDER — FENTANYL CITRATE (PF) 100 MCG/2ML IJ SOLN
INTRAMUSCULAR | Status: DC | PRN
  Administered 2021-12-22 (×3): 12.5 ug via INTRAVENOUS

## 2021-12-22 SURGICAL SUPPLY — 14 items
CABLE SURGICAL S-101-97-12 (CABLE) ×2 IMPLANT
CATH CPS LOCATOR 3D MED (CATHETERS) IMPLANT
HELIX LOCKING TOOL (MISCELLANEOUS) ×1
LEAD ULTIPACE 52 LPA1231/52 (Lead) IMPLANT
LEAD ULTIPACE 65 LPA1231/65 (Lead) IMPLANT
MAT PREVALON FULL STRYKER (MISCELLANEOUS) IMPLANT
PACEMAKER ASSURITY DR-RF (Pacemaker) IMPLANT
PAD DEFIB RADIO PHYSIO CONN (PAD) ×2 IMPLANT
SHEATH 7FR PRELUDE SNAP 13 (SHEATH) IMPLANT
SHEATH 9FR PRELUDE SNAP 13 (SHEATH) IMPLANT
SLITTER AGILIS HISPRO (INSTRUMENTS) IMPLANT
TOOL HELIX LOCKING (MISCELLANEOUS) IMPLANT
TRAY PACEMAKER INSERTION (PACKS) ×2 IMPLANT
WIRE HI TORQ VERSACORE-J 145CM (WIRE) IMPLANT

## 2021-12-22 NOTE — Interval H&P Note (Signed)
History and Physical Interval Note:  12/22/2021 11:18 AM  [name]  has presented today for surgery, with the diagnosis of snd.  The various methods of treatment have been discussed with the patient and family. After consideration of risks, benefits and other options for treatment, the patient has consented to  Procedure(s): PACEMAKER IMPLANT (N/A) as a surgical intervention.  The patient's history has been reviewed, patient examined, no change in status, stable for surgery.  I have reviewed the patient's chart and labs.  Questions were answered to the patient's satisfaction.     Cristopher Peru

## 2021-12-22 NOTE — Progress Notes (Signed)
Walter Le, MD by to see patient and CXR reviewed. MD stated that patient was good to D/C at 1800.

## 2021-12-22 NOTE — Discharge Instructions (Signed)
After Your Pacemaker   You have a [employer] Pacemaker  ACTIVITY Do not lift your arm above shoulder height for 1 week after your procedure. After 7 days, you may progress as below.  You should remove your sling 24 hours after your procedure, unless otherwise instructed by your provider.     Wednesday December 29, 2021  Thursday December 30, 2021 Friday December 31, 2021 Saturday January 01, 2022   Do not lift, push, pull, or carry anything over 10 pounds with the affected arm until 6 weeks (Wednesday February 02, 2022 ) after your procedure.   You may drive AFTER your wound check, unless you have been told otherwise by your provider.   Ask your healthcare provider when you can go back to work   INCISION/Dressing If you are on a blood thinner such as Coumadin, Xarelto, Eliquis, Plavix, or Pradaxa please confirm with your provider when this should be resumed. 12/27/21  If large square, outer bandage is left in place, this can be removed after 24 hours from your procedure. Do not remove steri-strips or glue as below.   Monitor your Pacemaker site for redness, swelling, and drainage. Call the device clinic at [phone] if you experience these symptoms or fever/chills.  If your incision is sealed with Steri-strips or staples, you may shower 7 days after your procedure or when told by your provider. Do not remove the steri-strips or let the shower hit directly on your site. You may wash around your site with soap and water.    If you were discharged in a sling, please do not wear this during the day more than 48 hours after your surgery unless otherwise instructed. This may increase the risk of stiffness and soreness in your shoulder.   Avoid lotions, ointments, or perfumes over your incision until it is well-healed.  You may use a hot tub or a pool AFTER your wound check appointment if the incision is completely closed.  Pacemaker Alerts:  Some alerts are vibratory and others beep. These  are NOT emergencies. Please call our office to let us know. If this occurs at night or on weekends, it can wait until the next business day. Send a remote transmission.  If your device is capable of reading fluid status (for heart failure), you will be offered monthly monitoring to review this with you.   DEVICE MANAGEMENT Remote monitoring is used to monitor your pacemaker from home. This monitoring is scheduled every 91 days by our office. It allows [place] to keep an eye on the functioning of your device to ensure it is working properly. You will routinely see your Electrophysiologist annually (more often if necessary).   You should receive your ID card for your new device in 4-8 weeks. Keep this card with you at all times once received. Consider wearing a medical alert bracelet or necklace.  Your Pacemaker may be MRI compatible. This will be discussed at your next office visit/wound check.  You should avoid contact with strong electric or magnetic fields.   Do not use amateur (ham) radio equipment or electric (arc) welding torches. MP3 player headphones with magnets should not be used. Some devices are safe to use if held at least 12 inches (30 cm) from your Pacemaker. These include power tools, lawn mowers, and speakers. If you are unsure if something is safe to use, ask your health care provider.  When using your cell phone, hold it to the ear that is on the opposite side from the  Pacemaker. Do not leave your cell phone in a pocket over the Pacemaker.  You may safely use electric blankets, heating pads, computers, and microwave ovens.  Call the office right away if: You have chest pain. You feel more short of breath than you have felt before. You feel more light-headed than you have felt before. Your incision starts to open up.  This information is not intended to replace advice given to you by your health care provider. Make sure you discuss any questions you have with your health care  provider.

## 2021-12-23 ENCOUNTER — Encounter (HOSPITAL_COMMUNITY): Payer: Self-pay | Admitting: Internal Medicine

## 2021-12-24 ENCOUNTER — Telehealth: Payer: Self-pay

## 2021-12-24 NOTE — Telephone Encounter (Signed)
Follow-up after same day discharge: Implant date: 12/22/21 MD: Cristopher Peru, MD Device: PPM  Location: Left Chest   Wound check visit: 01/05/22 90 day MD follow-up: 04/19/22  Remote Transmission received:Yes  Dressing/sling removed: Yes  Confirm [place] restart on: 12/27/2021  Patient expressed concerns about cost of Eliquis... Will forward to pharmacy to see if Xarelto might be cheaper

## 2021-12-24 NOTE — Telephone Encounter (Signed)
Eliquis and Xarelto are likely  be the same price. Sounds like he may be in the coverage gap but he should contact his insurance plan to confirm.

## 2021-12-24 NOTE — Telephone Encounter (Signed)
-----   Message from Shirley Friar, PA-C sent at 12/22/2021  1:43 PM EDT ----- Regarding: Same Day Discharge PPM 12/22/21 Dr. Lovena Le

## 2021-12-30 ENCOUNTER — Telehealth: Payer: Self-pay | Admitting: Internal Medicine

## 2021-12-30 ENCOUNTER — Other Ambulatory Visit (HOSPITAL_COMMUNITY): Payer: Self-pay

## 2021-12-30 ENCOUNTER — Ambulatory Visit (INDEPENDENT_AMBULATORY_CARE_PROVIDER_SITE_OTHER): Payer: PPO | Admitting: Internal Medicine

## 2021-12-30 ENCOUNTER — Ambulatory Visit (HOSPITAL_COMMUNITY)
Admission: RE | Admit: 2021-12-30 | Discharge: 2021-12-30 | Disposition: A | Discharge status: Home / Self Care | Payer: PPO | Source: Ambulatory Visit | Attending: Cardiovascular Disease | Admitting: Cardiovascular Disease

## 2021-12-30 DIAGNOSIS — M7989 Other specified soft tissue disorders: Secondary | ICD-10-CM

## 2021-12-30 DIAGNOSIS — I82622 Acute embolism and thrombosis of deep veins of left upper extremity: Secondary | ICD-10-CM

## 2021-12-30 NOTE — Telephone Encounter (Signed)
Patient reports of swelling in left hand (only) that started yesterday. Denies pain, redness or warmth to touch. Able to open and close fingers, although swelling is above knuckles. Advised patient I will check to see if he can be added onto schedule this evening. I will call him back with follow up. Voiced understanding.

## 2021-12-30 NOTE — Telephone Encounter (Signed)
Spoke to [employer], [employer]. Verbal orders obtained to make sure patient is taking his Eliquis 2.'5mg'$  BID and keeps left arm/hand elevated. Patient called and advised of orders and also if any worsening to please let us know. Patient aware of wound check on 01/05/22 and stressed importance of attending apt. Patient voiced understanding.  Patient was aware of clot prior to my phone call. Answered questions for patient and advised if further should arise to please call. Voiced understanding.

## 2021-12-30 NOTE — Telephone Encounter (Signed)
Pt c/o swelling: STAT is pt has developed SOB within 24 hours  How much weight have you gained and in what time span? No  If swelling, where is the swelling located? Left hand. States he can't see his knuckles.   Are you currently taking a fluid pill? No   Are you currently SOB? No   Do you have a log of your daily weights (if so, list)?   Have you gained 3 pounds in a day or 5 pounds in a week? No   Have you traveled recently? No  Pt states that he had pacemaker implant last week and wonders if this could be caused by that. Also worried about possible blood clot. Requesting call back.

## 2021-12-30 NOTE — Telephone Encounter (Addendum)
Spoke to Dr. Ali Lowe and verbal order obtained to schedule left upper extremity venus doppler STAT. Doppler scheduled today at [facility] @ 3:30 (patient advised to arrive at 3:15). Location, date and time discussed with patient. Also given address and phone number.  Spoke to [employer], PA-C to verify venus doppler is correct, agreed.   Added onto Dr. Dara Hoyer schedule (DOD) 12/30/21 @ 4:50. Patient aware and address given along with phone number.   Patient advised as soon as doppler is completed to come straight to [place] for apt with Dr. Ali Lowe. Patient voiced understanding.   Patient appreciative of return call and quick apt.   Routing to Dr. Lovena Le to make aware.

## 2021-12-30 NOTE — Progress Notes (Signed)
  Cardiology Office Note:    Date:  12/30/2021   ID:  [name], DOB [birth year], MRN [id number]  PCP:  Celene Squibb, MD   [place] Providers Cardiologist:  Lenna Sciara, MD Referring MD: Celene Squibb, MD   Chief Complaint/Reason for Referral:  LUE swelling  PATIENT DOD APPOINTMENT CANCELLED IN FAVOR OF UE U/S (AND PATIENT IS BACK ON CHRONIC ELIQUIS); DISCUSSED WITH EP SECTION   ASSESSMENT:    1. Swelling of left hand     PLAN:    In order of problems listed above: 1.  Upper extremity swelling:          Labs/tests ordered: Orders Placed This Encounter  Procedures   VAS [place] LOWER EXTREMITY VENOUS (DVT)     History of Present Illness:    FOCUSED PROBLEM LIST:   1.  Coronary artery disease status post CABG consisting of the LAD and vein graft to PDA 2023 2.  Hypertension 3.  Hyperlipidemia 4.  Paroxysmal atrial fibrillation on Eliquis 5.  Symptomatic bradycardia due to sinus node dysfunction status post dual-chamber permanent pacemaker  The patient is a 81 y.o. male with the indicated medical history here for expedited visit due to upper extremity swelling following a permanent pacemaker implantation on November 1.  The patient resumed his Eliquis on November 6.         Current Medications: No outpatient medications have been marked as taking for the 12/30/21 encounter (Office Visit) with Early Osmond, MD.     Allergies:    Patient has no known allergies.   Social History:   Social History   Tobacco Use   Smoking status: Never   Smokeless tobacco: Never  Vaping Use   Vaping Use: Never used  Substance Use Topics   Alcohol use: No    Alcohol/week: 0.0 standard drinks of alcohol   Drug use: No     Family Hx: Family History  Problem Relation Age of Onset   Diabetes Mother    Coronary artery disease Mother    Coronary artery disease Father    Diabetes Father    Stomach cancer Father    Deep vein thrombosis Daughter    Hypotension  Neg Hx    Anesthesia problems Neg Hx    Malignant hyperthermia Neg Hx    Pseudochol deficiency Neg Hx    Colon cancer Neg Hx      Review of Systems:   Please see the history of present illness.    All other systems reviewed and are negative.     EKGs/Labs/Other Test Reviewed:      Recent Labs: 03/05/2021: ALT 28 03/10/2021: Magnesium 2.3 03/14/2021: B Natriuretic Peptide 155.0 11/29/2021: BUN 35; Creatinine, Ser 1.73; Hemoglobin 15.1; Platelets 179; Potassium 4.2; Sodium 137   Recent Lipid Panel Lab Results  Component Value Date/Time   CHOL 200 11/10/2015 07:34 AM   TRIG 208 (H) 11/10/2015 07:34 AM   HDL 34 (L) 11/10/2015 07:34 AM   LDLCALC 124 11/10/2015 07:34 AM    Risk Assessment/Calculations:    Signed, Early Osmond, MD  12/30/2021 12:58 PM    [name], [place], [place]  [address] Phone: [phone]; Fax: [phone]   Note:  This document was prepared using Dragon voice recognition software and may include unintentional dictation errors.

## 2021-12-30 NOTE — Telephone Encounter (Signed)
Spoke to [place], [place] who spoke with Dr. Curt Bears in the hospital about patient. Verbal order obtained for patient to elevate and continue to doppler ultrasound. Per Dr. Curt Bears patient does NOT need to be seen by DOD today since he is already on [facility]. Patient called and updated to plan of care.   Staff message sent to [facility], Dr. Lovena Le and Leafy Ro to follow up since I am out of the office tomorrow 12/31/21.

## 2021-12-30 NOTE — Telephone Encounter (Signed)
Call from [place] office. Patient is positive of clot in LUE near pacemaker. Informed ordering provider. He recommends to follow-up with EP and keep taking his eliquis. Confirmed patient is taking his eliquis. Will forward to Dr. Lovena Le and his nurse.

## 2022-01-01 ENCOUNTER — Encounter (INDEPENDENT_AMBULATORY_CARE_PROVIDER_SITE_OTHER): Payer: Self-pay | Admitting: Gastroenterology

## 2022-01-05 ENCOUNTER — Ambulatory Visit: Payer: PPO | Attending: Internal Medicine

## 2022-01-05 DIAGNOSIS — Z950 Presence of cardiac pacemaker: Secondary | ICD-10-CM | POA: Diagnosis not present

## 2022-01-05 DIAGNOSIS — R001 Bradycardia, unspecified: Secondary | ICD-10-CM

## 2022-01-05 NOTE — Patient Instructions (Signed)
   After Your Pacemaker   Monitor your pacemaker site for redness, swelling, and drainage. Call the device clinic at [phone] if you experience these symptoms or fever/chills.  Wound healing looks great, you need to increase your Eliquis to '5mg'$  twice daily for the next 3 months until you see Dr. Lovena Le in February.   Your incision was closed with Steri-strips or staples:  You may shower 7 days after your procedure and wash your incision with soap and water. Avoid lotions, ointments, or perfumes over your incision until it is well-healed.  You may use a hot tub or a pool after your wound check appointment if the incision is completely closed.  Do not lift, push or pull greater than 10 pounds with the affected arm until 6 weeks after your procedure. There are no other restrictions in arm movement after your wound check appointment. Until After December 13th  You may drive, unless driving has been restricted by your healthcare providers.   Remote monitoring is used to monitor your pacemaker from home. This monitoring is scheduled every 91 days by our office. It allows [place] to keep an eye on the functioning of your device to ensure it is working properly. You will routinely see your Electrophysiologist annually (more often if necessary).

## 2022-01-06 LAB — CUP PACEART INCLINIC DEVICE CHECK
Battery Remaining Longevity: 84 mo
Brady Statistic RA Percent Paced: 53 %
Brady Statistic RV Percent Paced: 48 %
Date Time Interrogation Session: 20231115113339
Implantable Lead Connection Status: 753985
Implantable Lead Connection Status: 753985
Implantable Lead Implant Date: 20231101
Implantable Lead Implant Date: 20231101
Implantable Lead Location: 753859
Implantable Lead Location: 753860
Implantable Pulse Generator Implant Date: 20231101
Lead Channel Impedance Value: 430 Ohm
Lead Channel Impedance Value: 550 Ohm
Lead Channel Pacing Threshold Amplitude: 0.5 V
Lead Channel Pacing Threshold Amplitude: 0.75 V
Lead Channel Pacing Threshold Pulse Width: 0.5 ms
Lead Channel Pacing Threshold Pulse Width: 0.5 ms
Lead Channel Sensing Intrinsic Amplitude: 5 mV
Lead Channel Sensing Intrinsic Amplitude: 8.4 mV
Pulse Gen Model: 2272
Pulse Gen Serial Number: 8115159

## 2022-01-06 MED ORDER — APIXABAN 5 MG PO TABS: 5.0000 mg | ORAL_TABLET | Freq: Two times a day (BID) | ORAL | 1 refills | Status: DC

## 2022-01-06 NOTE — Progress Notes (Signed)
Wound check appointment. Steri-strips removed. Wound without redness or edema. Incision edges approximated, wound well healed. Normal device function. Thresholds, sensing, and impedances consistent with implant measurements.   Device programmed at 3.5V/auto capture programmed on for extra safety margin until 3 month visit. Histogram distribution appropriate for patient and level of activity. No mode switches or high ventricular rates noted. Patient educated about wound care, arm mobility, lifting restrictions. ROV in 3 months with implanting physician.    Note: patient's pocket has healed WNL no swelling or s/s of hematoma at site.  Patient instructed per Dr. Lovena Le to increase his Eliquis to '5mg'$  bid X 3 months.  He will stay on until his 91 day follow up with Lovena Le in February.  patient given samples and patient assistance paperwork to help with cost.  RX sent in for #60, 1 refill as he was given a 4 week supply of samples in office today.  Patient doing well today, no reported concerns or symptoms.

## 2022-01-12 NOTE — Telephone Encounter (Signed)
Agree with the lowere dose of eliquis. GT

## 2022-02-08 ENCOUNTER — Other Ambulatory Visit: Payer: Self-pay | Admitting: Cardiology

## 2022-02-17 ENCOUNTER — Other Ambulatory Visit: Payer: PPO

## 2022-02-17 DIAGNOSIS — Z8546 Personal history of malignant neoplasm of prostate: Secondary | ICD-10-CM | POA: Diagnosis not present

## 2022-02-18 LAB — PSA: Prostate Specific Ag, Serum: <0.1 ng/mL (ref 0.0–4.0)

## 2022-02-24 ENCOUNTER — Ambulatory Visit: Payer: PPO | Admitting: Urology

## 2022-02-24 ENCOUNTER — Encounter: Payer: Self-pay | Admitting: Urology

## 2022-02-24 VITALS — BP 174/67 | HR 61

## 2022-02-24 DIAGNOSIS — N3281 Overactive bladder: Secondary | ICD-10-CM | POA: Diagnosis not present

## 2022-02-24 DIAGNOSIS — N390 Urinary tract infection, site not specified: Secondary | ICD-10-CM

## 2022-02-24 DIAGNOSIS — N1832 Chronic kidney disease, stage 3b: Secondary | ICD-10-CM | POA: Diagnosis not present

## 2022-02-24 DIAGNOSIS — N3941 Urge incontinence: Secondary | ICD-10-CM

## 2022-02-24 DIAGNOSIS — R300 Dysuria: Secondary | ICD-10-CM

## 2022-02-24 DIAGNOSIS — R82998 Other abnormal findings in urine: Secondary | ICD-10-CM

## 2022-02-24 DIAGNOSIS — Z8546 Personal history of malignant neoplasm of prostate: Secondary | ICD-10-CM | POA: Diagnosis not present

## 2022-02-24 LAB — URINALYSIS, ROUTINE W REFLEX MICROSCOPIC
Bilirubin, UA: NEGATIVE
Glucose, UA: NEGATIVE
Ketones, UA: NEGATIVE
Nitrite, UA: POSITIVE — AB
Specific Gravity, UA: 1.02 (ref 1.005–1.030)
Urobilinogen, Ur: 0.2 mg/dL (ref 0.2–1.0)
pH, UA: 5 (ref 5.0–7.5)

## 2022-02-24 LAB — MICROSCOPIC EXAMINATION: WBC, UA: >30 /hpf — AB (ref 0–5)

## 2022-02-24 MED ORDER — CEPHALEXIN 500 MG PO CAPS: 500.0000 mg | ORAL_CAPSULE | Freq: Two times a day (BID) | ORAL | 0 refills | Status: AC

## 2022-02-24 NOTE — Progress Notes (Signed)
Subjective:  1. History of prostate cancer   2. Urge incontinence   3. Urinary tract infection without hematuria, site unspecified     01/31/20: [name] returns today in f/u from recent imaging.  He had a CT for the abdominal pain he had at his last visit and was found to have some small but slightly larger left iliac adenopathy.  He then had a PSMA PET that showed mild uptake in the nodes and this is felt to be either indolent nodal metastases vs slightly enlarged benign nodes.  His PSA remains <0.1 in 10/21.  He has continue to have some abdominal discomfort and has seen GI but a cause has not been clearly determined.  He has chronic constipation.    He is having SUI and uses paper towel.  He has urgency with UUI that has progressed.  He has nocturia x 3.   He had previously been on Oxybutynin and he was given samples of Gemtesa which helped a little bit.  He had UDS in the past that showed a 239m bladder with some instability.   02/25/21:Walter Mannerreturns today in f/u.  His PSA prior to this visit remains undetectible.  A repeat CT [facility] on 01/26/21 showed no further adenopathy or other concerning findings.  He is going to have a CABG on 03/09/21 by Dr. LKipp Brood  He had been having DOE.  He has persistent MUI.  He didn't get much benefit from oxybutynin or myrbetriq.  He is using a paper towel in the underwear and has moderate nocturia.  He has had no hematuria.  He had a seed implant remotely.    02/24/22:Walter Mannerreturns today in f/u. His PSA remains <0.1.  Chest CT in 7/23 showed no concerning findings.  He has a history of MUI with DI. He had a CABG in 1/23. He remains on Eliquis. He had to have a pacemaker in November.  He has stable LUTS with an IPSS of 20 with nocturia 4x and some intermittency and dribbling. He has some MUI.  He has occ dysuria but no hematuria. He had a DVT in the left arm and the Eliquis dose was increased.  His UA has >30 WBC, 3-10 RBC and moderate bacteria.  He has CKD 3b with a Cr of  1.73 in 10/23.    IPSS     Row Name 02/24/22 1300         International Prostate Symptom Score   How often have you had the sensation of not emptying your bladder? More than half the time     How often have you had to urinate less than every two hours? About half the time     How often have you found you stopped and started again several times when you urinated? Less than half the time     How often have you found it difficult to postpone urination? More than half the time     How often have you had a weak urinary stream? Less than half the time     How often have you had to strain to start urination? Not at All     How many times did you typically get up at night to urinate? 4 Times     Total IPSS Score 19               ROS:  ROS:  A complete review of systems was performed.  All systems are negative except for pertinent findings as noted.  ROS  No Known Allergies  Outpatient Encounter Medications as of 02/24/2022  Medication Sig   acetaminophen (TYLENOL) 500 MG tablet Take 500 mg by mouth every 6 (six) hours as needed (for pain.).   amLODipine (NORVASC) 5 MG tablet TAKE ONE TABLET ('5MG'$  TOTAL) BY MOUTH DAILY   apixaban (ELIQUIS) 5 MG TABS tablet Take 1 tablet (5 mg total) by mouth 2 (two) times daily.   atorvastatin (LIPITOR) 20 MG tablet Take 1 tablet (20 mg total) by mouth daily. (Patient taking differently: Take 20 mg by mouth at bedtime.)   cephALEXin (KEFLEX) 500 MG capsule Take 1 capsule (500 mg total) by mouth 2 (two) times daily for 7 days.   ezetimibe (ZETIA) 10 MG tablet Take 10 mg by mouth at bedtime.   No facility-administered encounter medications on file as of 02/24/2022.    Past Medical History:  Diagnosis Date   Arthritis    CAD (coronary artery disease)    Two-vessel obstructive disease November 2022; CABG with LIMA to LAD and SVG to PDA January 2023   Chronic back pain    CKD (chronic kidney disease) stage 3, GFR 30-59 ml/min (HCC)    Essential  hypertension    GERD (gastroesophageal reflux disease)    Helicobacter pylori gastritis    Treated   History of kidney stones    Hx of adenomatous colonic polyps    IBS (irritable bowel syndrome)    PAF (paroxysmal atrial fibrillation) (HCC)    Prostate CA (HCC)    Seed implants   Schatzki's ring    Stroke [facility])     Past Surgical History:  Procedure Laterality Date   APPENDECTOMY  age 58   BACK SURGERY  02/08   [facility]   BACK SURGERY  09/24/04   [facility], [facility]   BACK SURGERY  09/18/02   [facility]   BILIARY STENT PLACEMENT  01/07/2012   Procedure: BILIARY STENT PLACEMENT;  Surgeon: Rogene Houston, MD;  Location: [facility];  Service: Endoscopy;  Laterality: N/A;   BILIARY STENT PLACEMENT  02/21/2012   Procedure: BILIARY STENT PLACEMENT;  Surgeon: Rogene Houston, MD;  Location: [facility];  Service: Endoscopy;  Laterality: N/A;  Biliary Stent Replacement   CATARACT EXTRACTION W/PHACO  11/22/2010   Procedure: CATARACT EXTRACTION PHACO AND INTRAOCULAR LENS PLACEMENT ([place]);  Surgeon: Williams Che;  Location: [facility];  Service: Ophthalmology;  Laterality: Right;  CDE: 6.81   CHOLECYSTECTOMY  03/04/05   [facility], [place]. Gangrenous cholecystitis complicated by abscess requiring percutaneous drainage. He also had common duct stones requiring ERCP and sphincterotomy.   COLONOSCOPY  August 2005   Scattered sigmoid diverticulosis, splenic flexure polyp. Hyperplastic   COLONOSCOPY  1997   3 cm tubular adenoma and a sending colon   COLONOSCOPY  02/01/2011   Rourk-friable anal canal, hyperplastic rectal polyp   COLONOSCOPY N/A 08/15/2013   Procedure: COLONOSCOPY;  Surgeon: Rogene Houston, MD;  [facility];  Service: Endoscopy;  Laterality: N/A;  100   COLONOSCOPY N/A 09/20/2018   Procedure: COLONOSCOPY;  Surgeon: Rogene Houston, MD;  [facility];  Service: Endoscopy;  Laterality: N/A;  12:00pm   CORONARY ARTERY BYPASS GRAFT N/A 03/09/2021   Procedure: OFF PUMP CORONARY ARTERY BYPASS  GRAFTING (CABG) X2, USING LEFT INTERNAL MAMMARY ARTERY AND RIGHT LEG GREATER SAPHENOUS VEIN HARVESTED ENDOSCOPICALLY;  Surgeon: Lajuana Matte, MD;  Location: [place];  Service: Open Heart Surgery;  Laterality: N/A;  X 2   ERCP  03/02/05  [facility], [facility]. Normal-appearing biliary tree (gallbladder not image), status post sphincterotomy with recovery of small pieces of stone material, status post balloon occlusion cholangiogram.   ERCP  01/07/2012   Procedure: ENDOSCOPIC RETROGRADE CHOLANGIOPANCREATOGRAPHY (ERCP);  Surgeon: Rogene Houston, MD;  [facility];  Service: Endoscopy;  Laterality: N/A;  possible biliary stenting   ERCP  02/21/2012   Procedure: ENDOSCOPIC RETROGRADE CHOLANGIOPANCREATOGRAPHY (ERCP);  Surgeon: Rogene Houston, MD;  [facility];  Service: Endoscopy;  Laterality: N/A;  common bile duct stone and clip removed   ERCP N/A 05/18/2012   Procedure: ENDOSCOPIC RETROGRADE CHOLANGIOPANCREATOGRAPHY (ERCP);  Surgeon: Rogene Houston, MD;  [facility];  Service: Endoscopy;  Laterality: N/A;  stone and debri removal   ESOPHAGOGASTRODUODENOSCOPY  August 2005   Erosive esophagitis and Schatzki ring, small hiatal hernia   ESOPHAGOGASTRODUODENOSCOPY  01/2008   Mild reflux esophagitis, small hiatal hernia   ESOPHAGOGASTRODUODENOSCOPY  02/01/2011   Rourk-erosive reflux esophagitis, small hiatal hernia   ESOPHAGOGASTRODUODENOSCOPY  10/26/2011   Dr. Eli Phillips gastric submucosal petechia (bx-benign ulceration), juxta ampullary duodenal diverticulum and some mucosal edema involving 1st/2nd portion of duodenum with superficial erosions (bx-superficial ulceration/benign)   ESOPHAGOGASTRODUODENOSCOPY  02/21/2012   Procedure: ESOPHAGOGASTRODUODENOSCOPY (EGD);  Surgeon: Rogene Houston, MD;  [facility];  Service: Endoscopy;  Laterality: N/A;   EYE SURGERY     Incomplete colonoscopy  December 2009   Left-sided diverticula, mid descending colon polyp, due to recurrent looping and  redundancy exam was incomplete. It was felt that the mid colon was reached. Followup barium enema showed colon interposition between the liver and the diaphragm, redundant sigmoid colon but no colon mass or polyp identified.. Pathology revealed tubular adenoma.   LEFT HEART CATH AND CORONARY ANGIOGRAPHY N/A 01/18/2021   Procedure: LEFT HEART CATH AND CORONARY ANGIOGRAPHY;  Surgeon: Lorretta Harp, MD;  Location: [place] [facility];  Service: Cardiovascular;  Laterality: N/A;   PACEMAKER IMPLANT N/A 12/22/2021   Procedure: PACEMAKER IMPLANT;  Surgeon: Evans Lance, MD;  Location: [place] [facility];  Service: Cardiovascular;  Laterality: N/A;   POLYPECTOMY  09/20/2018   Procedure: POLYPECTOMY;  Surgeon: Rogene Houston, MD;  [facility];  Service: Endoscopy;;  Hepatic flexure polyp cold snare, splenic flexure polyp   SPHINCTEROTOMY  01/07/2012   Procedure: SPHINCTEROTOMY;  Surgeon: Rogene Houston, MD;  [facility];  Service: Endoscopy;  Laterality: N/A;  Extended   SPYGLASS CHOLANGIOSCOPY  02/21/2012   Procedure: SPYGLASS CHOLANGIOSCOPY;  Surgeon: Rogene Houston, MD;  [facility];  Service: Endoscopy;  Laterality: N/A;   SPYGLASS CHOLANGIOSCOPY N/A 05/18/2012   Procedure: SPYGLASS CHOLANGIOSCOPY;  Surgeon: Rogene Houston, MD;  [facility];  Service: Endoscopy;  Laterality: N/A;   TEE WITHOUT CARDIOVERSION N/A 03/09/2021   Procedure: TRANSESOPHAGEAL ECHOCARDIOGRAM (TEE);  Surgeon: Lajuana Matte, MD;  Location: [place];  Service: Open Heart Surgery;  Laterality: N/A;   TOTAL KNEE ARTHROPLASTY  03/21/2012   Procedure: TOTAL KNEE ARTHROPLASTY;  Surgeon: [name], MD;  Location: [place];  Service: Orthopedics;  Laterality: Right;  right knee arthroplasty   TOTAL KNEE ARTHROPLASTY Left 04/08/2019   Procedure: LEFT TOTAL KNEE ARTHROPLASTY;  Surgeon: Frederik Pear, MD;  Location: [facility];  Service: Orthopedics;  Laterality: Left;    Social History   Socioeconomic  History   Marital status: Married    Spouse name: Not on file   Number of children: 2   Years of education: Not on file  Highest education level: Not on file  Occupational History   Occupation: Retired Chemical engineer: RETIRED  Tobacco Use   Smoking status: Never   Smokeless tobacco: Never  Vaping Use   Vaping Use: Never used  Substance and Sexual Activity   Alcohol use: No    Alcohol/week: 0.0 standard drinks of alcohol   Drug use: No   Sexual activity: Not on file  Other Topics Concern   Not on file  Social History Narrative   Not on file   Social Determinants of Health   Financial Resource Strain: Not on file  Food Insecurity: Not on file  Transportation Needs: Not on file  Physical Activity: Not on file  Stress: Not on file  Social Connections: Not on file  Intimate Partner Violence: Not on file    Family History  Problem Relation Age of Onset   Diabetes Mother    Coronary artery disease Mother    Coronary artery disease Father    Diabetes Father    Stomach cancer Father    Deep vein thrombosis Daughter    Hypotension Neg Hx    Anesthesia problems Neg Hx    Malignant hyperthermia Neg Hx    Pseudochol deficiency Neg Hx    Colon cancer Neg Hx        Objective: Vitals:   02/24/22 1315  BP: (!) 174/67  Pulse: 61     Physical Exam  Lab Results:  Results for orders placed or performed in visit on 02/24/22 (from the past 24 hour(s))  Urinalysis, Routine w reflex microscopic     Status: Abnormal   Collection Time: 02/24/22  1:19 PM  Result Value Ref Range   Specific Gravity, UA 1.020 1.005 - 1.030   pH, UA 5.0 5.0 - 7.5   Color, UA Yellow Yellow   Appearance Ur Cloudy (A) Clear   Leukocytes,UA 3+ (A) Negative   Protein,UA 2+ (A) Negative/Trace   Glucose, UA Negative Negative   Ketones, UA Negative Negative   RBC, UA 3+ (A) Negative   Bilirubin, UA Negative Negative   Urobilinogen, Ur 0.2 0.2 - 1.0 mg/dL   Nitrite, UA Positive (A)  Negative   Microscopic Examination See below:    Narrative   Performed at:  [facility] [address], [place], [place]  [id number] Lab Director: Mina Marble MT, Phone:  [phone]  Microscopic Examination     Status: Abnormal   Collection Time: 02/24/22  1:19 PM   Urine  Result Value Ref Range   WBC, UA >30 (A) 0 - 5 /hpf   RBC, Urine 3-10 (A) 0 - 2 /hpf   Epithelial Cells (non renal) 0-10 0 - 10 /hpf   Bacteria, UA Moderate (A) None seen/Few   Narrative   Performed at:  [facility] [address], [place], [place]  [id number] Lab Director: Mina Marble MT, Phone:  [phone]     BMET No results for input(s): "NA", "K", "CL", "CO2", "GLUCOSE", "BUN", "CREATININE", "CALCIUM" in the last 72 hours. PSA <0.1 in 9/20. <0.1 in 10/21 <0.1 in 12/22 <0.1 in 12/23 UA reviewed.  He appears to have a UTI.    Studies/Results:  CT reports reviewed.     PSA reviewed.     Assessment & Plan: History of prostate cancer.  PSA remains undetectible.  I will have him return in a year with a PSA.Marland Kitchen  OAB with urge incontinence.  He hasn't responded to meds.  UTI.  His UA looks infected and he has some discomfort with voiding.  I will get a culture and send keflex pending the culture.  Dose adjusted for CKD.  Meds ordered this encounter  Medications   cephALEXin (KEFLEX) 500 MG capsule    Sig: Take 1 capsule (500 mg total) by mouth 2 (two) times daily for 7 days.    Dispense:  14 capsule    Refill:  0     Orders Placed This Encounter  Procedures   Urine Culture   Microscopic Examination   Urinalysis, Routine w reflex microscopic   PSA    Standing Status:   Future    Standing Expiration Date:   02/25/2023      Return in about 4 weeks (around 03/24/2022) for He needs 1 month and 1 year f/u.  PSA at 1 year. .   CC: Celene Squibb, MD      Irine Seal 02/24/2022 Patient ID: [name], male   DOB: 13-Jun-1940, 82 y.o.   MRN: [id number]

## 2022-02-25 ENCOUNTER — Telehealth: Payer: Self-pay | Admitting: Internal Medicine

## 2022-02-25 NOTE — Telephone Encounter (Signed)
I will forward to device clinic.

## 2022-02-25 NOTE — Telephone Encounter (Signed)
Spoke with patient, had patient send a transmission, patient stated when he felt his pulse it was skipping patient stated he thought the pacemaker was supposed to stop that. Transmission showed patient having PVCs, informed patient that pacemaker could not stop PVC that it just keep his heart from going to slow, patient also stated that he just overall felt bad after getting device felt like he just couldn't go.Patient agreeable to apt on 03/02/21 with Oda Kilts to discuss concerns.

## 2022-02-25 NOTE — Telephone Encounter (Signed)
Patient is requesting a call back to discuss PPM. He would like a better understanding of how it works.

## 2022-02-28 ENCOUNTER — Telehealth: Payer: Self-pay

## 2022-02-28 LAB — URINE CULTURE

## 2022-02-28 MED ORDER — SULFAMETHOXAZOLE-TRIMETHOPRIM 800-160 MG PO TABS: 0.5000 | ORAL_TABLET | Freq: Two times a day (BID) | ORAL | 0 refills | Status: DC

## 2022-02-28 NOTE — Addendum Note (Signed)
Addended by: Darcella Gasman R on: 02/28/2022 11:05 AM   Modules accepted: Orders

## 2022-02-28 NOTE — Progress Notes (Unsigned)
Electrophysiology Office Note Date: 02/28/2022  ID:  YANKY VANDERBURG, DOB [birth year], MRN [id number]  PCP: Celene Squibb, MD Primary Cardiologist: Rozann Lesches, MD Electrophysiologist: Cristopher Peru, MD   CC: Pacemaker follow-up  [name] is a 82 y.o. male seen today for Cristopher Peru, MD for {VISITTYPE:28148}  Device History: St. Jude Dual Chamber PPM implanted 12/22/2021 for symptomatic SND  Past Medical History:  Diagnosis Date   Arthritis    CAD (coronary artery disease)    Two-vessel obstructive disease November 2022; CABG with LIMA to LAD and SVG to PDA January 2023   Chronic back pain    CKD (chronic kidney disease) stage 3, GFR 30-59 ml/min (HCC)    Essential hypertension    GERD (gastroesophageal reflux disease)    Helicobacter pylori gastritis    Treated   History of kidney stones    Hx of adenomatous colonic polyps    IBS (irritable bowel syndrome)    PAF (paroxysmal atrial fibrillation) ([place])    Prostate CA ([place])    Seed implants   Schatzki's ring    Stroke [facility])    Past Surgical History:  Procedure Laterality Date   APPENDECTOMY  age 26   BACK SURGERY  02/08   [facility]   BACK SURGERY  09/24/04   [facility], [facility]   BACK SURGERY  09/18/02   [facility]   BILIARY STENT PLACEMENT  01/07/2012   Procedure: BILIARY STENT PLACEMENT;  Surgeon: Rogene Houston, MD;  Location: [facility];  Service: Endoscopy;  Laterality: N/A;   BILIARY STENT PLACEMENT  02/21/2012   Procedure: BILIARY STENT PLACEMENT;  Surgeon: Rogene Houston, MD;  Location: [facility];  Service: Endoscopy;  Laterality: N/A;  Biliary Stent Replacement   CATARACT EXTRACTION W/PHACO  11/22/2010   Procedure: CATARACT EXTRACTION PHACO AND INTRAOCULAR LENS PLACEMENT ([place]);  Surgeon: Williams Che;  Location: [facility];  Service: Ophthalmology;  Laterality: Right;  CDE: 6.81   CHOLECYSTECTOMY  03/04/05   [facility], [place]. Gangrenous cholecystitis complicated by abscess requiring percutaneous drainage. He also had common duct  stones requiring ERCP and sphincterotomy.   COLONOSCOPY  August 2005   Scattered sigmoid diverticulosis, splenic flexure polyp. Hyperplastic   COLONOSCOPY  1997   3 cm tubular adenoma and a sending colon   COLONOSCOPY  02/01/2011   Rourk-friable anal canal, hyperplastic rectal polyp   COLONOSCOPY N/A 08/15/2013   Procedure: COLONOSCOPY;  Surgeon: Rogene Houston, MD;  [facility];  Service: Endoscopy;  Laterality: N/A;  100   COLONOSCOPY N/A 09/20/2018   Procedure: COLONOSCOPY;  Surgeon: Rogene Houston, MD;  [facility];  Service: Endoscopy;  Laterality: N/A;  12:00pm   CORONARY ARTERY BYPASS GRAFT N/A 03/09/2021   Procedure: OFF PUMP CORONARY ARTERY BYPASS GRAFTING (CABG) X2, USING LEFT INTERNAL MAMMARY ARTERY AND RIGHT LEG GREATER SAPHENOUS VEIN HARVESTED ENDOSCOPICALLY;  Surgeon: Lajuana Matte, MD;  Location: [place];  Service: Open Heart Surgery;  Laterality: N/A;  X 2   ERCP  03/02/05   [facility]. Normal-appearing biliary tree (gallbladder not image), status post sphincterotomy with recovery of small pieces of stone material, status post balloon occlusion cholangiogram.   ERCP  01/07/2012   Procedure: ENDOSCOPIC RETROGRADE CHOLANGIOPANCREATOGRAPHY (ERCP);  Surgeon: Rogene Houston, MD;  Location: [facility];  Service: Endoscopy;  Laterality: N/A;  possible biliary stenting   ERCP  02/21/2012   Procedure: ENDOSCOPIC RETROGRADE CHOLANGIOPANCREATOGRAPHY (ERCP);  Surgeon: Rogene Houston, MD;  Location: [facility];  Service: Endoscopy;  Laterality: N/A;  common bile duct stone and clip removed   ERCP N/A 05/18/2012   Procedure: ENDOSCOPIC RETROGRADE CHOLANGIOPANCREATOGRAPHY (ERCP);  Surgeon: Rogene Houston, MD;  Location: [facility];  Service: Endoscopy;  Laterality: N/A;  stone and debri removal   ESOPHAGOGASTRODUODENOSCOPY  August 2005   Erosive esophagitis and Schatzki ring, small hiatal hernia   ESOPHAGOGASTRODUODENOSCOPY  01/2008   Mild reflux esophagitis, small hiatal  hernia   ESOPHAGOGASTRODUODENOSCOPY  02/01/2011   Rourk-erosive reflux esophagitis, small hiatal hernia   ESOPHAGOGASTRODUODENOSCOPY  10/26/2011   Dr. Eli Phillips gastric submucosal petechia (bx-benign ulceration), juxta ampullary duodenal diverticulum and some mucosal edema involving 1st/2nd portion of duodenum with superficial erosions (bx-superficial ulceration/benign)   ESOPHAGOGASTRODUODENOSCOPY  02/21/2012   Procedure: ESOPHAGOGASTRODUODENOSCOPY (EGD);  Surgeon: Rogene Houston, MD;  [facility]: [facility];  Service: Endoscopy;  Laterality: N/A;   EYE SURGERY     Incomplete colonoscopy  December 2009   Left-sided diverticula, mid descending colon polyp, due to recurrent looping and redundancy exam was incomplete. It was felt that the mid colon was reached. Followup barium enema showed colon interposition between the liver and the diaphragm, redundant sigmoid colon but no colon mass or polyp identified.. Pathology revealed tubular adenoma.   LEFT HEART CATH AND CORONARY ANGIOGRAPHY N/A 01/18/2021   Procedure: LEFT HEART CATH AND CORONARY ANGIOGRAPHY;  Surgeon: Lorretta Harp, MD;  Location: [place] [facility];  Service: Cardiovascular;  Laterality: N/A;   PACEMAKER IMPLANT N/A 12/22/2021   Procedure: PACEMAKER IMPLANT;  Surgeon: Evans Lance, MD;  Location: [place] [facility];  Service: Cardiovascular;  Laterality: N/A;   POLYPECTOMY  09/20/2018   Procedure: POLYPECTOMY;  Surgeon: Rogene Houston, MD;  [facility];  Service: Endoscopy;;  Hepatic flexure polyp cold snare, splenic flexure polyp   SPHINCTEROTOMY  01/07/2012   Procedure: SPHINCTEROTOMY;  Surgeon: Rogene Houston, MD;  Location[facility];  Service: Endoscopy;  Laterality: N/A;  Extended   SPYGLASS CHOLANGIOSCOPY  02/21/2012   Procedure: SPYGLASS CHOLANGIOSCOPY;  Surgeon: Rogene Houston, MD;  Location: [facility];  Service: Endoscopy;  Laterality: N/A;   SPYGLASS CHOLANGIOSCOPY N/A 05/18/2012   Procedure: SPYGLASS  CHOLANGIOSCOPY;  Surgeon: Rogene Houston, MD;  Location: [facility];  Service: Endoscopy;  Laterality: N/A;   TEE WITHOUT CARDIOVERSION N/A 03/09/2021   Procedure: TRANSESOPHAGEAL ECHOCARDIOGRAM (TEE);  Surgeon: Lajuana Matte, MD;  Location: [place];  Service: Open Heart Surgery;  Laterality: N/A;   TOTAL KNEE ARTHROPLASTY  03/21/2012   Procedure: TOTAL KNEE ARTHROPLASTY;  Surgeon: [name], MD;  Location: [place];  Service: Orthopedics;  Laterality: Right;  right knee arthroplasty   TOTAL KNEE ARTHROPLASTY Left 04/08/2019   Procedure: LEFT TOTAL KNEE ARTHROPLASTY;  Surgeon: Frederik Pear, MD;  Location: [facility];  Service: Orthopedics;  Laterality: Left;    Current Outpatient Medications  Medication Sig Dispense Refill   acetaminophen (TYLENOL) 500 MG tablet Take 500 mg by mouth every 6 (six) hours as needed (for pain.).     amLODipine (NORVASC) 5 MG tablet TAKE ONE TABLET ('5MG'$  TOTAL) BY MOUTH DAILY 30 tablet 6   apixaban (ELIQUIS) 5 MG TABS tablet Take 1 tablet (5 mg total) by mouth 2 (two) times daily. 60 tablet 1   atorvastatin (LIPITOR) 20 MG tablet Take 1 tablet (20 mg total) by mouth daily. (Patient taking differently: Take 20 mg by mouth at bedtime.) 90 tablet 3   cephALEXin (KEFLEX) 500 MG capsule Take 1 capsule (500 mg  total) by mouth 2 (two) times daily for 7 days. 14 capsule 0   ezetimibe (ZETIA) 10 MG tablet Take 10 mg by mouth at bedtime.     No current facility-administered medications for this visit.    Allergies:   Patient has no known allergies.   Social History: Social History   Socioeconomic History   Marital status: Married    Spouse name: Not on file   Number of children: 2   Years of education: Not on file   Highest education level: Not on file  Occupational History   Occupation: Retired Chemical engineer: RETIRED  Tobacco Use   Smoking status: Never   Smokeless tobacco: Never  Vaping Use   Vaping Use: Never used  Substance and Sexual Activity    Alcohol use: No    Alcohol/week: 0.0 standard drinks of alcohol   Drug use: No   Sexual activity: Not on file  Other Topics Concern   Not on file  Social History Narrative   Not on file   Social Determinants of Health   Financial Resource Strain: Not on file  Food Insecurity: Not on file  Transportation Needs: Not on file  Physical Activity: Not on file  Stress: Not on file  Social Connections: Not on file  Intimate Partner Violence: Not on file    Family History: Family History  Problem Relation Age of Onset   Diabetes Mother    Coronary artery disease Mother    Coronary artery disease Father    Diabetes Father    Stomach cancer Father    Deep vein thrombosis Daughter    Hypotension Neg Hx    Anesthesia problems Neg Hx    Malignant hyperthermia Neg Hx    Pseudochol deficiency Neg Hx    Colon cancer Neg Hx      Review of Systems: All other systems reviewed and are otherwise negative except as noted above.  Physical Exam: There were no vitals filed for this visit.   GEN- The patient is well appearing, alert and oriented x 3 today.   HEENT: normocephalic, atraumatic; sclera clear, conjunctiva pink; hearing intact; oropharynx clear; neck supple, no JVP Lymph- no cervical lymphadenopathy Lungs- Clear to ausculation bilaterally, normal work of breathing.  No wheezes, rales, rhonchi Heart- {Blank single:19197::"Regular","Irregularly irregular"}  rate and rhythm, no murmurs, rubs or gallops, PMI not laterally displaced GI- soft, non-tender, non-distended, bowel sounds present, no hepatosplenomegaly Extremities- no clubbing or cyanosis. {EDEMA OZDGU:44034} peripheral edema; DP/PT/radial pulses 2+ bilaterally MS- no significant deformity or atrophy Skin- warm and dry, no rash or lesion; PPM pocket well healed Psych- euthymic mood, full affect Neuro- strength and sensation are intact  PPM Interrogation-  reviewed in detail today,  See PACEART report.  EKG:  EKG {ACTION;  IS/IS VQQ:[id number]} ordered today. Personal review of ekg ordered {Blank single:19197::"today","***"} shows ***   Recent Labs: 03/05/2021: ALT 28 03/10/2021: Magnesium 2.3 03/14/2021: B Natriuretic Peptide 155.0 11/29/2021: BUN 35; Creatinine, Ser 1.73; Hemoglobin 15.1; Platelets 179; Potassium 4.2; Sodium 137   Wt Readings from Last 3 Encounters:  12/22/21 235 lb (106.6 kg)  11/23/21 247 lb (112 kg)  08/23/21 251 lb (113.9 kg)     Other studies Reviewed: Additional studies/ records that were reviewed today include: Previous EP office notes, Previous remote checks, Most recent labwork.   {Select studies to display:26339}  Assessment and Plan:  1. SND s/p St. Jude PPM  Normal PPM function See Claudia Desanctis Art report No changes today  2. PVCs  3. CAD s/p CABG   Current medicines are reviewed at length with the patient today.    Labs/ tests ordered today include: *** No orders of the defined types were placed in this encounter.    Disposition:   Follow up with {EPPROVIDERS:28135} in {Blank single:19197::"2 weeks","4 weeks","3 months","6 months","12 months","as usual post gen change"}    Signed, Shirley Friar, PA-C  02/28/2022 10:02 AM  [facility] [address] [place] [address] [phone] (office) [phone] (fax)

## 2022-02-28 NOTE — Telephone Encounter (Signed)
Made patient aware that we received his urine culture and he should stop the keflex. Made patient aware that a rx bactrim ds 1/2 tab po bid#7 was sent to his pharmacy, patient voice understanding.

## 2022-03-02 ENCOUNTER — Ambulatory Visit: Payer: PPO | Attending: Student | Admitting: Student

## 2022-03-02 ENCOUNTER — Encounter: Payer: Self-pay | Admitting: Student

## 2022-03-02 VITALS — BP 130/66 | HR 60 | Ht 74.0 in | Wt 245.0 lb

## 2022-03-02 DIAGNOSIS — I493 Ventricular premature depolarization: Secondary | ICD-10-CM | POA: Diagnosis not present

## 2022-03-02 DIAGNOSIS — I25119 Atherosclerotic heart disease of native coronary artery with unspecified angina pectoris: Secondary | ICD-10-CM

## 2022-03-02 DIAGNOSIS — R001 Bradycardia, unspecified: Secondary | ICD-10-CM

## 2022-03-02 LAB — CUP PACEART INCLINIC DEVICE CHECK
Battery Remaining Longevity: 87 mo
Battery Voltage: 3.01 V
Brady Statistic RA Percent Paced: 58 %
Brady Statistic RV Percent Paced: 3.1 %
Date Time Interrogation Session: 20240110121420
Implantable Lead Connection Status: 753985
Implantable Lead Connection Status: 753985
Implantable Lead Implant Date: 20231101
Implantable Lead Implant Date: 20231101
Implantable Lead Location: 753859
Implantable Lead Location: 753860
Implantable Pulse Generator Implant Date: 20231101
Lead Channel Impedance Value: 425 Ohm
Lead Channel Impedance Value: 425 Ohm
Lead Channel Impedance Value: 475 Ohm
Lead Channel Impedance Value: 475 Ohm
Lead Channel Pacing Threshold Amplitude: 0.5 V
Lead Channel Pacing Threshold Amplitude: 0.5 V
Lead Channel Pacing Threshold Amplitude: 0.5 V
Lead Channel Pacing Threshold Amplitude: 0.5 V
Lead Channel Pacing Threshold Amplitude: 0.75 V
Lead Channel Pacing Threshold Amplitude: 0.75 V
Lead Channel Pacing Threshold Amplitude: 0.75 V
Lead Channel Pacing Threshold Amplitude: 0.75 V
Lead Channel Pacing Threshold Pulse Width: 0.5 ms
Lead Channel Pacing Threshold Pulse Width: 0.5 ms
Lead Channel Pacing Threshold Pulse Width: 0.5 ms
Lead Channel Pacing Threshold Pulse Width: 0.5 ms
Lead Channel Pacing Threshold Pulse Width: 0.5 ms
Lead Channel Pacing Threshold Pulse Width: 0.5 ms
Lead Channel Pacing Threshold Pulse Width: 0.5 ms
Lead Channel Pacing Threshold Pulse Width: 0.5 ms
Lead Channel Sensing Intrinsic Amplitude: 12 mV
Lead Channel Sensing Intrinsic Amplitude: 12 mV
Lead Channel Sensing Intrinsic Amplitude: 5 mV
Lead Channel Sensing Intrinsic Amplitude: 5 mV
Lead Channel Setting Pacing Amplitude: 3.5 V
Lead Channel Setting Pacing Amplitude: 3.5 V
Lead Channel Setting Pacing Pulse Width: 0.5 ms
Lead Channel Setting Sensing Sensitivity: 2 mV
Pulse Gen Model: 2272
Pulse Gen Serial Number: 8115159

## 2022-03-02 NOTE — Patient Instructions (Signed)
Medication Instructions:  Your physician recommends that you continue on your current medications as directed. Please refer to the Current Medication list given to you today.  *If you need a refill on your cardiac medications before your next appointment, please call your pharmacy*   Lab Work: TODAY: BMET, CBC  If you have labs (blood work) drawn today and your tests are completely normal, you will receive your results only by: [place] (if you have MyChart) OR A paper copy in the mail If you have any lab test that is abnormal or we need to change your treatment, we will call you to review the results.   Follow-Up: At [facility], you and your health needs are our priority.  As part of our continuing mission to provide you with exceptional heart care, we have created designated Provider Care Teams.  These Care Teams include your primary Cardiologist (physician) and Advanced Practice Providers (APPs -  Physician Assistants and Nurse Practitioners) who all work together to provide you with the care you need, when you need it.   Your next appointment:   As scheduled  Important Information About Sugar

## 2022-03-03 LAB — CBC
Hematocrit: 44.5 % (ref 37.5–51.0)
Hemoglobin: 14.7 g/dL (ref 13.0–17.7)
MCH: 31.7 pg (ref 26.6–33.0)
MCHC: 33 g/dL (ref 31.5–35.7)
MCV: 96 fL (ref 79–97)
Platelets: 180 10*3/uL (ref 150–450)
RBC: 4.63 x10E6/uL (ref 4.14–5.80)
RDW: 12.1 % (ref 11.6–15.4)
WBC: 5.4 10*3/uL (ref 3.4–10.8)

## 2022-03-03 LAB — BASIC METABOLIC PANEL
BUN/Creatinine Ratio: 13 (ref 10–24)
BUN: 24 mg/dL (ref 8–27)
CO2: 25 mmol/L (ref 20–29)
Calcium: 9.3 mg/dL (ref 8.6–10.2)
Chloride: 100 mmol/L (ref 96–106)
Creatinine, Ser: 1.85 mg/dL — ABNORMAL HIGH (ref 0.76–1.27)
Glucose: 82 mg/dL (ref 70–99)
Potassium: 4.5 mmol/L (ref 3.5–5.2)
Sodium: 140 mmol/L (ref 134–144)
eGFR: 36 mL/min/{1.73_m2} — ABNORMAL LOW (ref >59)

## 2022-03-09 ENCOUNTER — Encounter: Payer: PPO | Admitting: Internal Medicine

## 2022-03-24 ENCOUNTER — Ambulatory Visit (INDEPENDENT_AMBULATORY_CARE_PROVIDER_SITE_OTHER): Payer: PPO | Admitting: Urology

## 2022-03-24 VITALS — BP 152/65 | HR 60

## 2022-03-24 DIAGNOSIS — N3281 Overactive bladder: Secondary | ICD-10-CM

## 2022-03-24 DIAGNOSIS — Z8546 Personal history of malignant neoplasm of prostate: Secondary | ICD-10-CM

## 2022-03-24 DIAGNOSIS — Z87442 Personal history of urinary calculi: Secondary | ICD-10-CM | POA: Diagnosis not present

## 2022-03-24 DIAGNOSIS — R319 Hematuria, unspecified: Secondary | ICD-10-CM | POA: Diagnosis not present

## 2022-03-24 DIAGNOSIS — N3941 Urge incontinence: Secondary | ICD-10-CM | POA: Diagnosis not present

## 2022-03-24 DIAGNOSIS — N390 Urinary tract infection, site not specified: Secondary | ICD-10-CM | POA: Diagnosis not present

## 2022-03-24 LAB — URINALYSIS, ROUTINE W REFLEX MICROSCOPIC
Bilirubin, UA: NEGATIVE
Glucose, UA: NEGATIVE
Nitrite, UA: POSITIVE — AB
Specific Gravity, UA: 1.025 (ref 1.005–1.030)
Urobilinogen, Ur: 0.2 mg/dL (ref 0.2–1.0)
pH, UA: 5.5 (ref 5.0–7.5)

## 2022-03-24 LAB — MICROSCOPIC EXAMINATION
RBC, Urine: >30 /hpf — AB (ref 0–2)
WBC, UA: >30 /hpf — AB (ref 0–5)

## 2022-03-24 LAB — BLADDER SCAN AMB NON-IMAGING: Scan Result: 37

## 2022-03-24 MED ORDER — SULFAMETHOXAZOLE-TRIMETHOPRIM 800-160 MG PO TABS: 0.5000 | ORAL_TABLET | Freq: Two times a day (BID) | ORAL | 0 refills | Status: DC

## 2022-03-24 NOTE — Progress Notes (Signed)
post void residual=37 

## 2022-03-24 NOTE — Progress Notes (Signed)
Subjective:  1. Urinary tract infection with hematuria, site unspecified   2. History of prostate cancer   3. Overactive bladder   4. Urge incontinence   5. History of nephrolithiasis   6. Urinary tract infection without hematuria, site unspecified     01/31/20: [name] returns today in f/u from recent imaging.  He had a CT for the abdominal pain he had at his last visit and was found to have some small but slightly larger left iliac adenopathy.  He then had a PSMA PET that showed mild uptake in the nodes and this is felt to be either indolent nodal metastases vs slightly enlarged benign nodes.  His PSA remains <0.1 in 10/21.  He has continue to have some abdominal discomfort and has seen GI but a cause has not been clearly determined.  He has chronic constipation.    He is having SUI and uses paper towel.  He has urgency with UUI that has progressed.  He has nocturia x 3.   He had previously been on Oxybutynin and he was given samples of Gemtesa which helped a little bit.  He had UDS in the past that showed a 222m bladder with some instability.   02/25/21:Walter Mannerreturns today in f/u.  His PSA prior to this visit remains undetectible.  A repeat CT [facility] on 01/26/21 showed no further adenopathy or other concerning findings.  He is going to have a CABG on 03/09/21 by Dr. LKipp Brood  He had been having DOE.  He has persistent MUI.  He didn't get much benefit from oxybutynin or myrbetriq.  He is using a paper towel in the underwear and has moderate nocturia.  He has had no hematuria.  He had a seed implant remotely.    02/24/22:Walter Mannerreturns today in f/u. His PSA remains <0.1.  Chest CT in 7/23 showed no concerning findings.  He has a history of MUI with DI. He had a CABG in 1/23. He remains on Eliquis. He had to have a pacemaker in November.  He has stable LUTS with an IPSS of 20 with nocturia 4x and some intermittency and dribbling. He has some MUI.  He has occ dysuria but no hematuria. He had a DVT in the left  arm and the Eliquis dose was increased.  His UA has >30 WBC, 3-10 RBC and moderate bacteria.  He has CKD 3b with a Cr of 1.73 in 10/23.  03/24/22:Walter Mannerreturns today in f/u following treatment of a UTI.  His culture grew serratia and he was switched from keflex to bactrim.   He has the occasional sharp pain in the penis.  He has persistent frequency and nocturia x 4.  He has some urgency with occasional UUI.  He has no hematuria. His Cr on 03/02/22 was 1.85.  He got a little better with the bactrim but his UA looks infected today.  He has a single stone remotely.           ROS:  ROS:  A complete review of systems was performed.  All systems are negative except for pertinent findings as noted.   Review of Systems  Constitutional:  Negative for chills and fever.  Genitourinary:  Positive for frequency and urgency.    No Known Allergies  Outpatient Encounter Medications as of 03/24/2022  Medication Sig   acetaminophen (TYLENOL) 500 MG tablet Take 500 mg by mouth every 6 (six) hours as needed (for pain.).   amLODipine (NORVASC) 5 MG tablet  TAKE ONE TABLET ('5MG'$  TOTAL) BY MOUTH DAILY   apixaban (ELIQUIS) 5 MG TABS tablet Take 1 tablet (5 mg total) by mouth 2 (two) times daily.   atorvastatin (LIPITOR) 20 MG tablet Take 1 tablet (20 mg total) by mouth daily. (Patient taking differently: Take 20 mg by mouth at bedtime.)   ezetimibe (ZETIA) 10 MG tablet Take 10 mg by mouth at bedtime.   sulfamethoxazole-trimethoprim (BACTRIM DS) 800-160 MG tablet Take 0.5 tablets by mouth every 12 (twelve) hours.   [DISCONTINUED] sulfamethoxazole-trimethoprim (BACTRIM DS) 800-160 MG tablet Take 0.5 tablets by mouth every 12 (twelve) hours.   [DISCONTINUED] sulfamethoxazole-trimethoprim (BACTRIM DS) 800-160 MG tablet Take 0.5 tablets by mouth every 12 (twelve) hours.   No facility-administered encounter medications on file as of 03/24/2022.    Past Medical History:  Diagnosis Date   Arthritis    CAD (coronary  artery disease)    Two-vessel obstructive disease November 2022; CABG with LIMA to LAD and SVG to PDA January 2023   Chronic back pain    CKD (chronic kidney disease) stage 3, GFR 30-59 ml/min (HCC)    Essential hypertension    GERD (gastroesophageal reflux disease)    Helicobacter pylori gastritis    Treated   History of kidney stones    Hx of adenomatous colonic polyps    IBS (irritable bowel syndrome)    PAF (paroxysmal atrial fibrillation) (HCC)    Prostate CA (HCC)    Seed implants   Schatzki's ring    Stroke [facility])     Past Surgical History:  Procedure Laterality Date   APPENDECTOMY  age 87   BACK SURGERY  02/08   [facility]   BACK SURGERY  09/24/04   [facility], [facility]   BACK SURGERY  09/18/02   [facility]   BILIARY STENT PLACEMENT  01/07/2012   Procedure: BILIARY STENT PLACEMENT;  Surgeon: Rogene [place], MD;  Location: [facility];  Service: Endoscopy;  Laterality: N/A;   BILIARY STENT PLACEMENT  02/21/2012   Procedure: BILIARY STENT PLACEMENT;  Surgeon: Rogene [place], MD;  Location: [facility];  Service: Endoscopy;  Laterality: N/A;  Biliary Stent Replacement   CATARACT EXTRACTION W/PHACO  11/22/2010   Procedure: CATARACT EXTRACTION PHACO AND INTRAOCULAR LENS PLACEMENT ([place]);  Surgeon: Williams Che;  Location: [facility];  Service: Ophthalmology;  Laterality: Right;  CDE: 6.81   CHOLECYSTECTOMY  03/04/05   [facility], [place]. Gangrenous cholecystitis complicated by abscess requiring percutaneous drainage. He also had common duct stones requiring ERCP and sphincterotomy.   COLONOSCOPY  August 2005   Scattered sigmoid diverticulosis, splenic flexure polyp. Hyperplastic   COLONOSCOPY  1997   3 cm tubular adenoma and a sending colon   COLONOSCOPY  02/01/2011   Rourk-friable anal canal, hyperplastic rectal polyp   COLONOSCOPY N/A 08/15/2013   Procedure: COLONOSCOPY;  Surgeon: Rogene [place], MD;  [facility];  Service: Endoscopy;  Laterality: N/A;  100   COLONOSCOPY N/A 09/20/2018    Procedure: COLONOSCOPY;  Surgeon: Rogene [place], MD;  [facility];  Service: Endoscopy;  Laterality: N/A;  12:00pm   CORONARY ARTERY BYPASS GRAFT N/A 03/09/2021   Procedure: OFF PUMP CORONARY ARTERY BYPASS GRAFTING (CABG) X2, USING LEFT INTERNAL MAMMARY ARTERY AND RIGHT LEG GREATER SAPHENOUS VEIN HARVESTED ENDOSCOPICALLY;  Surgeon: Lajuana Matte, MD;  Location: [place];  Service: Open Heart Surgery;  Laterality: N/A;  X 2   ERCP  03/02/05   [facility], [facility]. Normal-appearing biliary tree (gallbladder not image), status post  sphincterotomy with recovery of small pieces of stone material, status post balloon occlusion cholangiogram.   ERCP  01/07/2012   Procedure: ENDOSCOPIC RETROGRADE CHOLANGIOPANCREATOGRAPHY (ERCP);  Surgeon: Rogene [place], MD;  Location: [facility];  Service: Endoscopy;  Laterality: N/A;  possible biliary stenting   ERCP  02/21/2012   Procedure: ENDOSCOPIC RETROGRADE CHOLANGIOPANCREATOGRAPHY (ERCP);  Surgeon: Rogene [place], MD;  Location: [facility];  Service: Endoscopy;  Laterality: N/A;  common bile duct stone and clip removed   ERCP N/A 05/18/2012   Procedure: ENDOSCOPIC RETROGRADE CHOLANGIOPANCREATOGRAPHY (ERCP);  Surgeon: Rogene [place], MD;  Location: [facility];  Service: Endoscopy;  Laterality: N/A;  stone and debri removal   ESOPHAGOGASTRODUODENOSCOPY  August 2005   Erosive esophagitis and Schatzki ring, small hiatal hernia   ESOPHAGOGASTRODUODENOSCOPY  01/2008   Mild reflux esophagitis, small hiatal hernia   ESOPHAGOGASTRODUODENOSCOPY  02/01/2011   Rourk-erosive reflux esophagitis, small hiatal hernia   ESOPHAGOGASTRODUODENOSCOPY  10/26/2011   Dr. Eli Phillips gastric submucosal petechia (bx-benign ulceration), juxta ampullary duodenal diverticulum and some mucosal edema involving 1st/2nd portion of duodenum with superficial erosions (bx-superficial ulceration/benign)   ESOPHAGOGASTRODUODENOSCOPY  02/21/2012   Procedure: ESOPHAGOGASTRODUODENOSCOPY (EGD);  Surgeon:  Rogene [place], MD;  Location: [facility];  Service: Endoscopy;  Laterality: N/A;   EYE SURGERY     Incomplete colonoscopy  December 2009   Left-sided diverticula, mid descending colon polyp, due to recurrent looping and redundancy exam was incomplete. It was felt that the mid colon was reached. Followup barium enema showed colon interposition between the liver and the diaphragm, redundant sigmoid colon but no colon mass or polyp identified.. Pathology revealed tubular adenoma.   LEFT HEART CATH AND CORONARY ANGIOGRAPHY N/A 01/18/2021   Procedure: LEFT HEART CATH AND CORONARY ANGIOGRAPHY;  Surgeon: Lorretta Harp, MD;  Location: [place] [facility];  Service: Cardiovascular;  Laterality: N/A;   PACEMAKER IMPLANT N/A 12/22/2021   Procedure: PACEMAKER IMPLANT;  Surgeon: Evans Lance, MD;  Location: [place] [facility];  Service: Cardiovascular;  Laterality: N/A;   POLYPECTOMY  09/20/2018   Procedure: POLYPECTOMY;  Surgeon: Rogene [place], MD;  [facility];  Service: Endoscopy;;  Hepatic flexure polyp cold snare, splenic flexure polyp   SPHINCTEROTOMY  01/07/2012   Procedure: SPHINCTEROTOMY;  Surgeon: Rogene [place], MD;  [facility];  Service: Endoscopy;  Laterality: N/A;  Extended   SPYGLASS CHOLANGIOSCOPY  02/21/2012   Procedure: SPYGLASS CHOLANGIOSCOPY;  Surgeon: Rogene [place], MD;  Location: [facility];  Service: Endoscopy;  Laterality: N/A;   SPYGLASS CHOLANGIOSCOPY N/A 05/18/2012   Procedure: SPYGLASS CHOLANGIOSCOPY;  Surgeon: Rogene [place], MD;  Location: [facility];  Service: Endoscopy;  Laterality: N/A;   TEE WITHOUT CARDIOVERSION N/A 03/09/2021   Procedure: TRANSESOPHAGEAL ECHOCARDIOGRAM (TEE);  Surgeon: Lajuana Matte, MD;  Location: [place];  Service: Open Heart Surgery;  Laterality: N/A;   TOTAL KNEE ARTHROPLASTY  03/21/2012   Procedure: TOTAL KNEE ARTHROPLASTY;  Surgeon: [name], MD;  Location: [place];  Service: Orthopedics;  Laterality: Right;  right knee  arthroplasty   TOTAL KNEE ARTHROPLASTY Left 04/08/2019   Procedure: LEFT TOTAL KNEE ARTHROPLASTY;  Surgeon: Frederik Pear, MD;  Location: [facility];  Service: Orthopedics;  Laterality: Left;    Social History   Socioeconomic History   Marital status: Married    Spouse name: Not on file   Number of children: 2   Years of education: Not on file   Highest education level: Not on file  Occupational  History   Occupation: Retired Chemical engineer: RETIRED  Tobacco Use   Smoking status: Never   Smokeless tobacco: Never  Vaping Use   Vaping Use: Never used  Substance and Sexual Activity   Alcohol use: No    Alcohol/week: 0.0 standard drinks of alcohol   Drug use: No   Sexual activity: Not on file  Other Topics Concern   Not on file  Social History Narrative   Not on file   Social Determinants of Health   Financial Resource Strain: Not on file  Food Insecurity: Not on file  Transportation Needs: Not on file  Physical Activity: Not on file  Stress: Not on file  Social Connections: Not on file  Intimate Partner Violence: Not on file    Family History  Problem Relation Age of Onset   Diabetes Mother    Coronary artery disease Mother    Coronary artery disease Father    Diabetes Father    Stomach cancer Father    Deep vein thrombosis Daughter    Hypotension Neg Hx    Anesthesia problems Neg Hx    Malignant hyperthermia Neg Hx    Pseudochol deficiency Neg Hx    Colon cancer Neg Hx        Objective: Vitals:   03/24/22 1314  BP: (!) 152/65  Pulse: 60     Physical Exam Vitals reviewed.  Constitutional:      Appearance: Normal appearance.  Neurological:     Mental Status: He is alert.     Lab Results:  Results for orders placed or performed in visit on 03/24/22 (from the past 24 hour(s))  Urinalysis, Routine w reflex microscopic     Status: Abnormal   Collection Time: 03/24/22  1:20 PM  Result Value Ref Range   Specific Gravity, UA 1.025 1.005 - 1.030    pH, UA 5.5 5.0 - 7.5   Color, UA Yellow Yellow   Appearance Ur Cloudy (A) Clear   Leukocytes,UA 2+ (A) Negative   Protein,UA 2+ (A) Negative/Trace   Glucose, UA Negative Negative   Ketones, UA Trace (A) Negative   RBC, UA 2+ (A) Negative   Bilirubin, UA Negative Negative   Urobilinogen, Ur 0.2 0.2 - 1.0 mg/dL   Nitrite, UA Positive (A) Negative   Microscopic Examination See below:    Narrative   Performed at:  [facility] [address], [place], [place]  [id number] Lab Director: Mina Marble MT, Phone:  [phone]  Microscopic Examination     Status: Abnormal   Collection Time: 03/24/22  1:20 PM   Urine  Result Value Ref Range   WBC, UA >30 (A) 0 - 5 /hpf   RBC, Urine >30 (A) 0 - 2 /hpf   Epithelial Cells (non renal) 0-10 0 - 10 /hpf   Bacteria, UA Many (A) None seen/Few   Narrative   Performed at:  [facility] [address], [place], [place]  [id number] Lab Director: [place], Phone:  [phone]      BMET No results for input(s): "NA", "K", "CL", "CO2", "GLUCOSE", "BUN", "CREATININE", "CALCIUM" in the last 72 hours. PSA <0.1 in 9/20. <0.1 in 10/21 <0.1 in 12/22 <0.1 in 12/23 UA reviewed.  He appears to have a UTI.    Studies/Results:     Assessment & Plan: UTI with hematuria.  His UA still looks infected and he has some discomfort with voiding.  I will get a culture and  resume bactrim.  I will consider suppression based on the culture.  BMP next week to check the potassium and Cr.   History of stones.  With the recurrent infection, I will get him set up for a CT stone study and return for cystoscopy.   Meds ordered this encounter  Medications   DISCONTD: sulfamethoxazole-trimethoprim (BACTRIM DS) 800-160 MG tablet    Sig: Take 0.5 tablets by mouth every 12 (twelve) hours.    Dispense:  7 tablet    Refill:  0   sulfamethoxazole-trimethoprim (BACTRIM DS) 800-160 MG tablet    Sig: Take 0.5 tablets by mouth every 12  (twelve) hours.    Dispense:  28 tablet    Refill:  0     Orders Placed This Encounter  Procedures   Urine Culture   Microscopic Examination   CT RENAL STONE STUDY    Standing Status:   Future    Standing Expiration Date:   09/22/2022    Order Specific Question:   Preferred imaging location?    Answer:   [facility]    Order Specific Question:   Radiology Contrast Protocol - do NOT remove file path    Answer:   [email or web address]   Urinalysis, Routine w reflex microscopic   Basic metabolic panel    Standing Status:   Future    Number of Occurrences:   1    Standing Expiration Date:   06/22/2022   Bladder Scan (Post Void Residual) in office      Return for 2-3 weeks with CT results for cystoscopy. .   CC: Celene Squibb, MD      Irine Seal 03/25/2022 Patient ID: [name], male   DOB: Aug 03, 1940, 82 y.o.   MRN: [id number]

## 2022-03-25 ENCOUNTER — Telehealth: Payer: Self-pay

## 2022-03-25 ENCOUNTER — Other Ambulatory Visit: Payer: Self-pay

## 2022-03-25 DIAGNOSIS — N390 Urinary tract infection, site not specified: Secondary | ICD-10-CM

## 2022-03-25 LAB — BASIC METABOLIC PANEL
BUN/Creatinine Ratio: 16 (ref 10–24)
BUN: 27 mg/dL (ref 8–27)
CO2: 25 mmol/L (ref 20–29)
Calcium: 9.5 mg/dL (ref 8.6–10.2)
Chloride: 100 mmol/L (ref 96–106)
Creatinine, Ser: 1.74 mg/dL — ABNORMAL HIGH (ref 0.76–1.27)
Glucose: 98 mg/dL (ref 70–99)
Potassium: 4.5 mmol/L (ref 3.5–5.2)
Sodium: 140 mmol/L (ref 134–144)
eGFR: 39 mL/min/{1.73_m2} — ABNORMAL LOW (ref >59)

## 2022-03-25 LAB — PSA: Prostate Specific Ag, Serum: <0.1 ng/mL (ref 0.0–4.0)

## 2022-03-25 MED ORDER — SULFAMETHOXAZOLE-TRIMETHOPRIM 800-160 MG PO TABS: 0.5000 | ORAL_TABLET | Freq: Two times a day (BID) | ORAL | 0 refills | Status: DC

## 2022-03-25 NOTE — Telephone Encounter (Signed)
Patient came in today to let me know that their was an issue with the pharmacy electronic script. Patient is aware that the Rx was resent to the pharmacy. Patient that he is experience some hematuria and made him aware that I will send a task back to Dr. Jeffie Pollock about the hematuria and what the next steps will be. Patient voiced understanding

## 2022-03-28 ENCOUNTER — Telehealth: Payer: Self-pay | Admitting: Student

## 2022-03-28 ENCOUNTER — Telehealth: Payer: Self-pay

## 2022-03-28 ENCOUNTER — Ambulatory Visit: Payer: PPO

## 2022-03-28 DIAGNOSIS — I495 Sick sinus syndrome: Secondary | ICD-10-CM

## 2022-03-28 LAB — URINE CULTURE

## 2022-03-28 MED ORDER — TRIMETHOPRIM 100 MG PO TABS: 100.0000 mg | ORAL_TABLET | Freq: Every day | ORAL | 5 refills | Status: DC

## 2022-03-28 NOTE — Telephone Encounter (Signed)
-----   Message from Irine Seal, MD sent at 03/28/2022  4:04 PM EST ----- HE should complete the bactrim and then he needs to be on trimethprim '100mg'$  po qhs #30 with 5 refills for suppression.   ----- Message ----- From: Sherrilyn Rist, CMA Sent: 03/28/2022  11:46 AM EST To: Irine Seal, MD  Please review

## 2022-03-28 NOTE — Telephone Encounter (Signed)
  1. Has your device fired? no  2. Is you device beeping? no  3. Are you experiencing draining or swelling at device site? no  4. Are you calling to see if we received your device transmission? no  5. Have you passed out? no  Patient has a transmission scheduled today and is not sure if his device is manual or automatic.  Please route to [place]

## 2022-03-28 NOTE — Telephone Encounter (Signed)
Spoke with the patient to let him know that his monitor is automatic. Pt verbalized understanding and thanked me for the call.

## 2022-03-28 NOTE — Telephone Encounter (Signed)
-----   Message from Irine Seal, MD sent at 03/28/2022 11:17 AM EST ----- PSA remains undetectible.  Cr is stable at 1.74.   ----- Message ----- From: Sherrilyn Rist, CMA Sent: 03/25/2022   1:44 PM EST To: Irine Seal, MD  Please review

## 2022-03-28 NOTE — Telephone Encounter (Signed)
Patient is aware that PSA remains undetectible. Cr is stable at 1.74.  Patient voiced understanding.

## 2022-03-28 NOTE — Telephone Encounter (Signed)
Patient is aware to complete the bactrim and then he will need to be on trimethprim '100mg'$  po qhs #30 with 5 refills for suppression.  Patient voiced understanding

## 2022-03-29 LAB — CUP PACEART REMOTE DEVICE CHECK
Battery Remaining Longevity: 68 mo
Battery Remaining Percentage: 95.5 %
Battery Voltage: 3.01 V
Brady Statistic AP VP Percent: 6.4 %
Brady Statistic AP VS Percent: 67 %
Brady Statistic AS VP Percent: 1 %
Brady Statistic AS VS Percent: 23 %
Brady Statistic RA Percent Paced: 70 %
Brady Statistic RV Percent Paced: 6.9 %
Date Time Interrogation Session: 20240205020015
Implantable Lead Connection Status: 753985
Implantable Lead Connection Status: 753985
Implantable Lead Implant Date: 20231101
Implantable Lead Implant Date: 20231101
Implantable Lead Location: 753859
Implantable Lead Location: 753860
Implantable Pulse Generator Implant Date: 20231101
Lead Channel Impedance Value: 400 Ohm
Lead Channel Impedance Value: 460 Ohm
Lead Channel Pacing Threshold Amplitude: 0.5 V
Lead Channel Pacing Threshold Amplitude: 0.75 V
Lead Channel Pacing Threshold Pulse Width: 0.5 ms
Lead Channel Pacing Threshold Pulse Width: 0.5 ms
Lead Channel Sensing Intrinsic Amplitude: 12 mV
Lead Channel Sensing Intrinsic Amplitude: 5 mV
Lead Channel Setting Pacing Amplitude: 3.5 V
Lead Channel Setting Pacing Amplitude: 3.5 V
Lead Channel Setting Pacing Pulse Width: 0.5 ms
Lead Channel Setting Sensing Sensitivity: 2 mV
Pulse Gen Model: 2272
Pulse Gen Serial Number: 8115159

## 2022-04-01 ENCOUNTER — Other Ambulatory Visit: Payer: Self-pay | Admitting: Internal Medicine

## 2022-04-01 NOTE — Telephone Encounter (Signed)
Prescription refill request for Eliquis received.  Indication: afib  Last office visit: 03/02/2022 Scr: 1.74, 03/24/2022 Age: 82 yo  Weight: 111.1 kg   Pt is suppose to see Dr. Lovena Le on 2/27 for Eliquis check. Please see Melburn Popper note from 01/07/2023.

## 2022-04-11 ENCOUNTER — Other Ambulatory Visit: Payer: Self-pay | Admitting: Cardiology

## 2022-04-19 ENCOUNTER — Telehealth: Payer: Self-pay | Admitting: Internal Medicine

## 2022-04-19 ENCOUNTER — Encounter: Payer: Self-pay | Admitting: Internal Medicine

## 2022-04-19 ENCOUNTER — Ambulatory Visit: Payer: PPO | Attending: Internal Medicine | Admitting: Internal Medicine

## 2022-04-19 VITALS — BP 124/52 | HR 71 | Ht 74.5 in | Wt 241.0 lb

## 2022-04-19 DIAGNOSIS — I495 Sick sinus syndrome: Secondary | ICD-10-CM | POA: Diagnosis not present

## 2022-04-19 LAB — CUP PACEART INCLINIC DEVICE CHECK
Battery Remaining Longevity: 116 mo
Battery Voltage: 2.99 V
Brady Statistic RA Percent Paced: 66 %
Brady Statistic RV Percent Paced: 9.2 %
Date Time Interrogation Session: 20240227142520
Implantable Lead Connection Status: 753985
Implantable Lead Connection Status: 753985
Implantable Lead Implant Date: 20231101
Implantable Lead Implant Date: 20231101
Implantable Lead Location: 753859
Implantable Lead Location: 753860
Implantable Pulse Generator Implant Date: 20231101
Lead Channel Impedance Value: 400 Ohm
Lead Channel Impedance Value: 487.5 Ohm
Lead Channel Pacing Threshold Amplitude: 0.5 V
Lead Channel Pacing Threshold Amplitude: 0.5 V
Lead Channel Pacing Threshold Amplitude: 0.75 V
Lead Channel Pacing Threshold Amplitude: 0.75 V
Lead Channel Pacing Threshold Pulse Width: 0.5 ms
Lead Channel Pacing Threshold Pulse Width: 0.5 ms
Lead Channel Pacing Threshold Pulse Width: 0.5 ms
Lead Channel Pacing Threshold Pulse Width: 0.5 ms
Lead Channel Sensing Intrinsic Amplitude: 12 mV
Lead Channel Sensing Intrinsic Amplitude: 5 mV
Lead Channel Setting Pacing Amplitude: 2 V
Lead Channel Setting Pacing Amplitude: 2.5 V
Lead Channel Setting Pacing Pulse Width: 0.5 ms
Lead Channel Setting Sensing Sensitivity: 2 mV
Pulse Gen Model: 2272
Pulse Gen Serial Number: 8115159

## 2022-04-19 NOTE — Patient Instructions (Signed)
Medication Instructions:  Your physician recommends that you continue on your current medications as directed. Please refer to the Current Medication list given to you today.  *If you need a refill on your cardiac medications before your next appointment, please call your pharmacy*   Lab Work: NONE   If you have labs (blood work) drawn today and your tests are completely normal, you will receive your results only by: [place] (if you have MyChart) OR A paper copy in the mail If you have any lab test that is abnormal or we need to change your treatment, we will call you to review the results.   Testing/Procedures: NONE    Follow-Up: At [facility], you and your health needs are our priority.  As part of our continuing mission to provide you with exceptional heart care, we have created designated Provider Care Teams.  These Care Teams include your primary Cardiologist (physician) and Advanced Practice Providers (APPs -  Physician Assistants and Nurse Practitioners) who all work together to provide you with the care you need, when you need it.  We recommend signing up for the patient portal called "MyChart".  Sign up information is provided on this After Visit Summary.  MyChart is used to connect with patients for Virtual Visits (Telemedicine).  Patients are able to view lab/test results, encounter notes, upcoming appointments, etc.  Non-urgent messages can be sent to your provider as well.   To learn more about what you can do with MyChart, go to [email or web address].    Your next appointment:   3 month(s)  Provider:   Cristopher Peru, MD    Other Instructions Thank you for choosing [facility]!

## 2022-04-19 NOTE — Telephone Encounter (Signed)
Patient wants to know if he was to continue the eliquis? He said he forgot if it was mentioned this morning at his appt.

## 2022-04-19 NOTE — Telephone Encounter (Signed)
Spoke to pt and advised per Dr. Lovena Le that patient is to continue taking Eliquis. Patient requested patient assistance papers which were provided to him. Patient thankful. Patient had no further questions at this time.

## 2022-04-19 NOTE — Progress Notes (Signed)
HPI [name] is referred for evaluation of symptomatic bradycardia. He is a pleasant 82 yo man with CAD, s/p CABG in 1/23.  He has PAF which is well controlled. He has class 2 dyspnea. He denies anginal symptoms.  He especially notes difficulty with exertion.  No Known Allergies   Current Outpatient Medications  Medication Sig Dispense Refill   acetaminophen (TYLENOL) 500 MG tablet Take 500 mg by mouth every 6 (six) hours as needed (for pain.).     amLODipine (NORVASC) 5 MG tablet TAKE ONE TABLET ('5MG'$  TOTAL) BY MOUTH DAILY 30 tablet 6   apixaban (ELIQUIS) 5 MG TABS tablet TAKE ONE TABLET ('5MG'$  TOTAL) BY MOUTH TWOTIMES DAILY 60 tablet 0   atorvastatin (LIPITOR) 20 MG tablet Take 1 tablet (20 mg total) by mouth at bedtime. 30 tablet 0   ezetimibe (ZETIA) 10 MG tablet Take 10 mg by mouth at bedtime.     fluticasone (FLONASE) 50 MCG/ACT nasal spray Place 1 spray into both nostrils daily.     sulfamethoxazole-trimethoprim (BACTRIM DS) 800-160 MG tablet Take 0.5 tablets by mouth every 12 (twelve) hours. (Patient not taking: Reported on 04/19/2022) 28 tablet 0   trimethoprim (TRIMPEX) 100 MG tablet Take 1 tablet (100 mg total) by mouth at bedtime. (Patient not taking: Reported on 04/19/2022) 30 tablet 5   No current facility-administered medications for this visit.     Past Medical History:  Diagnosis Date   Arthritis    CAD (coronary artery disease)    Two-vessel obstructive disease November 2022; CABG with LIMA to LAD and SVG to PDA January 2023   Chronic back pain    CKD (chronic kidney disease) stage 3, GFR 30-59 ml/min (HCC)    Essential hypertension    GERD (gastroesophageal reflux disease)    Helicobacter pylori gastritis    Treated   History of kidney stones    Hx of adenomatous colonic polyps    IBS (irritable bowel syndrome)    PAF (paroxysmal atrial fibrillation) (HCC)    Prostate CA (HCC)    Seed implants   Schatzki's ring    Stroke ([place])     ROS:   All systems  reviewed and negative except as noted in the HPI.   Past Surgical History:  Procedure Laterality Date   APPENDECTOMY  age 45   BACK SURGERY  02/08   [facility]   BACK SURGERY  09/24/04   [facility], [facility]   BACK SURGERY  09/18/02   [facility]   BILIARY STENT PLACEMENT  01/07/2012   Procedure: BILIARY STENT PLACEMENT;  Surgeon: Rogene Houston, MD;  Location: [facility];  Service: Endoscopy;  Laterality: N/A;   BILIARY STENT PLACEMENT  02/21/2012   Procedure: BILIARY STENT PLACEMENT;  Surgeon: Rogene Houston, MD;  Location: [facility];  Service: Endoscopy;  Laterality: N/A;  Biliary Stent Replacement   CATARACT EXTRACTION W/PHACO  11/22/2010   Procedure: CATARACT EXTRACTION PHACO AND INTRAOCULAR LENS PLACEMENT ([place]);  Surgeon: Williams Che;  Location: [facility];  Service: Ophthalmology;  Laterality: Right;  CDE: 6.81   CHOLECYSTECTOMY  03/04/05   [facility], [facility]. Gangrenous cholecystitis complicated by abscess requiring percutaneous drainage. He also had common duct stones requiring ERCP and sphincterotomy.   COLONOSCOPY  August 2005   Scattered sigmoid diverticulosis, splenic flexure polyp. Hyperplastic   COLONOSCOPY  1997   3 cm tubular adenoma and a sending colon   COLONOSCOPY  02/01/2011   Rourk-friable anal canal, hyperplastic rectal polyp   COLONOSCOPY  N/A 08/15/2013   Procedure: COLONOSCOPY;  Surgeon: Rogene Houston, MD;  [facility];  Service: Endoscopy;  Laterality: N/A;  100   COLONOSCOPY N/A 09/20/2018   Procedure: COLONOSCOPY;  Surgeon: Rogene Houston, MD;  [facility];  Service: Endoscopy;  Laterality: N/A;  12:00pm   CORONARY ARTERY BYPASS GRAFT N/A 03/09/2021   Procedure: OFF PUMP CORONARY ARTERY BYPASS GRAFTING (CABG) X2, USING LEFT INTERNAL MAMMARY ARTERY AND RIGHT LEG GREATER SAPHENOUS VEIN HARVESTED ENDOSCOPICALLY;  Surgeon: Lajuana Matte, MD;  Location: [place];  Service: Open Heart Surgery;  Laterality: N/A;  X 2   ERCP  03/02/05   [facility], [facility]. Normal-appearing  biliary tree (gallbladder not image), status post sphincterotomy with recovery of small pieces of stone material, status post balloon occlusion cholangiogram.   ERCP  01/07/2012   Procedure: ENDOSCOPIC RETROGRADE CHOLANGIOPANCREATOGRAPHY (ERCP);  Surgeon: Rogene Houston, MD;  Location: [facility];  Service: Endoscopy;  Laterality: N/A;  possible biliary stenting   ERCP  02/21/2012   Procedure: ENDOSCOPIC RETROGRADE CHOLANGIOPANCREATOGRAPHY (ERCP);  Surgeon: Rogene Houston, MD;  Location: [facility];  Service: Endoscopy;  Laterality: N/A;  common bile duct stone and clip removed   ERCP N/A 05/18/2012   Procedure: ENDOSCOPIC RETROGRADE CHOLANGIOPANCREATOGRAPHY (ERCP);  Surgeon: Rogene Houston, MD;  Location: [facility];  Service: Endoscopy;  Laterality: N/A;  stone and debri removal   ESOPHAGOGASTRODUODENOSCOPY  August 2005   Erosive esophagitis and Schatzki ring, small hiatal hernia   ESOPHAGOGASTRODUODENOSCOPY  01/2008   Mild reflux esophagitis, small hiatal hernia   ESOPHAGOGASTRODUODENOSCOPY  02/01/2011   Rourk-erosive reflux esophagitis, small hiatal hernia   ESOPHAGOGASTRODUODENOSCOPY  10/26/2011   Dr. Eli Phillips gastric submucosal petechia (bx-benign ulceration), juxta ampullary duodenal diverticulum and some mucosal edema involving 1st/2nd portion of duodenum with superficial erosions (bx-superficial ulceration/benign)   ESOPHAGOGASTRODUODENOSCOPY  02/21/2012   Procedure: ESOPHAGOGASTRODUODENOSCOPY (EGD);  Surgeon: Rogene Houston, MD;  [facility];  Service: Endoscopy;  Laterality: N/A;   EYE SURGERY     Incomplete colonoscopy  December 2009   Left-sided diverticula, mid descending colon polyp, due to recurrent looping and redundancy exam was incomplete. It was felt that the mid colon was reached. Followup barium enema showed colon interposition between the liver and the diaphragm, redundant sigmoid colon but no colon mass or polyp identified.. Pathology revealed tubular adenoma.   LEFT  HEART CATH AND CORONARY ANGIOGRAPHY N/A 01/18/2021   Procedure: LEFT HEART CATH AND CORONARY ANGIOGRAPHY;  Surgeon: Lorretta Harp, MD;  Location: [place] [facility];  Service: Cardiovascular;  Laterality: N/A;   PACEMAKER IMPLANT N/A 12/22/2021   Procedure: PACEMAKER IMPLANT;  Surgeon: Evans Lance, MD;  Location: [place] [facility];  Service: Cardiovascular;  Laterality: N/A;   POLYPECTOMY  09/20/2018   Procedure: POLYPECTOMY;  Surgeon: Rogene Houston, MD;  [facility];  Service: Endoscopy;;  Hepatic flexure polyp cold snare, splenic flexure polyp   SPHINCTEROTOMY  01/07/2012   Procedure: SPHINCTEROTOMY;  Surgeon: Rogene Houston, MD;  [facility];  Service: Endoscopy;  Laterality: N/A;  Extended   SPYGLASS CHOLANGIOSCOPY  02/21/2012   Procedure: SPYGLASS CHOLANGIOSCOPY;  Surgeon: Rogene Houston, MD;  [facility];  Service: Endoscopy;  Laterality: N/A;   SPYGLASS CHOLANGIOSCOPY N/A 05/18/2012   Procedure: SPYGLASS CHOLANGIOSCOPY;  Surgeon: Rogene Houston, MD;  Location[facility];  Service: Endoscopy;  Laterality: N/A;   TEE WITHOUT CARDIOVERSION N/A 03/09/2021   Procedure: TRANSESOPHAGEAL ECHOCARDIOGRAM (TEE);  Surgeon: Kipp Brood,  Lucile Crater, MD;  Location: [place];  Service: Open Heart Surgery;  Laterality: N/A;   TOTAL KNEE ARTHROPLASTY  03/21/2012   Procedure: TOTAL KNEE ARTHROPLASTY;  Surgeon: [name], MD;  Location: [place];  Service: Orthopedics;  Laterality: Right;  right knee arthroplasty   TOTAL KNEE ARTHROPLASTY Left 04/08/2019   Procedure: LEFT TOTAL KNEE ARTHROPLASTY;  Surgeon: Frederik Pear, MD;  Location: [facility];  Service: Orthopedics;  Laterality: Left;     Family History  Problem Relation Age of Onset   Diabetes Mother    Coronary artery disease Mother    Coronary artery disease Father    Diabetes Father    Stomach cancer Father    Deep vein thrombosis Daughter    Hypotension Neg Hx    Anesthesia problems Neg Hx    Malignant hyperthermia Neg Hx     Pseudochol deficiency Neg Hx    Colon cancer Neg Hx      Social History   Socioeconomic History   Marital status: Married    Spouse name: Not on file   Number of children: 2   Years of education: Not on file   Highest education level: Not on file  Occupational History   Occupation: Retired Chemical engineer: RETIRED  Tobacco Use   Smoking status: Never   Smokeless tobacco: Never  Vaping Use   Vaping Use: Never used  Substance and Sexual Activity   Alcohol use: No    Alcohol/week: 0.0 standard drinks of alcohol   Drug use: No   Sexual activity: Not on file  Other Topics Concern   Not on file  Social History Narrative   Not on file   Social Determinants of Health   Financial Resource Strain: Not on file  Food Insecurity: Not on file  Transportation Needs: Not on file  Physical Activity: Not on file  Stress: Not on file  Social Connections: Not on file  Intimate Partner Violence: Not on file     BP (!) 124/52   Pulse 71   Ht 6' 2.5" (1.892 m)   Wt 241 lb (109.3 kg)   SpO2 98%   BMI 30.53 kg/m   Physical Exam:  Well appearing NAD HEENT: Unremarkable Neck:  No JVD, no thyromegally Lymphatics:  No adenopathy Back:  No CVA tenderness Lungs:  Clear with no wheezes HEART:  Regular rate rhythm, no murmurs, no rubs, no clicks Abd:  soft, positive bowel sounds, no organomegally, no rebound, no guarding Ext:  2 plus pulses, no edema, no cyanosis, no clubbing Skin:  No rashes no nodules Neuro:  CN II through XII intact, motor grossly intact   DEVICE  Normal device function.  See PaceArt for details.   Assess/Plan: PAF - he is mostly maintaining NSR. I have discussed stopping his eliquis but recommend he continue.  Symptomatic sinus node dysfunction - he has undergone PPM insertion. Today we have turned on rate response.   3. PVC's - these appear to be better controlled.  4. CAD - he is s/p CABG and denies anginal symptoms. We will  follow.  Carleene Overlie Taeko Schaffer,MD

## 2022-04-29 DIAGNOSIS — E039 Hypothyroidism, unspecified: Secondary | ICD-10-CM | POA: Diagnosis not present

## 2022-04-29 DIAGNOSIS — I129 Hypertensive chronic kidney disease with stage 1 through stage 4 chronic kidney disease, or unspecified chronic kidney disease: Secondary | ICD-10-CM | POA: Diagnosis not present

## 2022-04-29 DIAGNOSIS — R7303 Prediabetes: Secondary | ICD-10-CM | POA: Diagnosis not present

## 2022-05-02 ENCOUNTER — Telehealth: Payer: Self-pay | Admitting: *Deleted

## 2022-05-02 NOTE — Telephone Encounter (Signed)
I agree, go ahead and drop his dose.  SCr has been staying > 1.5

## 2022-05-02 NOTE — Telephone Encounter (Signed)
Prescription refill request for Eliquis received from [facility]  Indication: afib  Last office visit: Lovena Le 04/19/2022 Scr: 1.74, 03/24/2022 Age:  82 yo  Weight: 109.3 kg   Per dosing criteria pt qualifies for a dose decrease of Eliquis.

## 2022-05-03 ENCOUNTER — Other Ambulatory Visit: Payer: Self-pay | Admitting: Internal Medicine

## 2022-05-03 NOTE — Telephone Encounter (Signed)
Agree dose should be reduced to Eliquis 2.'5mg'$  BID for afib indication based on age > 80 and SCr > 1.5, thanks!

## 2022-05-03 NOTE — Telephone Encounter (Signed)
Called patient and told him due to his age and kidney function Eliquis is being reduced to 2.'5mg'$  twice daily.  He verbalized understanding.  New Rx sent in.

## 2022-05-03 NOTE — Telephone Encounter (Signed)
Prescription refill request for Eliquis received. Indication: PAF Last office visit: 04/19/22  Beckie Salts MD Scr: 1.74 Age: 82 Weight: 109.3kg  Based on above findings Eliquis 2.'5mg'$  twice daily would be the appropriate dose.  Message sent to [facility] to address dose reduction.

## 2022-05-04 NOTE — Telephone Encounter (Signed)
Dose was changed to Eliquis 2.'5mg'$  on yesterday.  Eliquis 2.'5mg'$  bid refill was sent in on yesterday by another nurse. Will not resend at this time.

## 2022-05-05 DIAGNOSIS — Z0001 Encounter for general adult medical examination with abnormal findings: Secondary | ICD-10-CM | POA: Diagnosis not present

## 2022-05-05 DIAGNOSIS — R0609 Other forms of dyspnea: Secondary | ICD-10-CM | POA: Diagnosis not present

## 2022-05-05 DIAGNOSIS — I872 Venous insufficiency (chronic) (peripheral): Secondary | ICD-10-CM | POA: Diagnosis not present

## 2022-05-05 DIAGNOSIS — Z23 Encounter for immunization: Secondary | ICD-10-CM | POA: Diagnosis not present

## 2022-05-05 DIAGNOSIS — M179 Osteoarthritis of knee, unspecified: Secondary | ICD-10-CM | POA: Diagnosis not present

## 2022-05-05 DIAGNOSIS — I129 Hypertensive chronic kidney disease with stage 1 through stage 4 chronic kidney disease, or unspecified chronic kidney disease: Secondary | ICD-10-CM | POA: Diagnosis not present

## 2022-05-05 DIAGNOSIS — R7303 Prediabetes: Secondary | ICD-10-CM | POA: Diagnosis not present

## 2022-05-05 DIAGNOSIS — E039 Hypothyroidism, unspecified: Secondary | ICD-10-CM | POA: Diagnosis not present

## 2022-05-05 DIAGNOSIS — Z951 Presence of aortocoronary bypass graft: Secondary | ICD-10-CM | POA: Diagnosis not present

## 2022-05-05 DIAGNOSIS — M25511 Pain in right shoulder: Secondary | ICD-10-CM | POA: Diagnosis not present

## 2022-05-05 DIAGNOSIS — E782 Mixed hyperlipidemia: Secondary | ICD-10-CM | POA: Diagnosis not present

## 2022-05-05 DIAGNOSIS — N1832 Chronic kidney disease, stage 3b: Secondary | ICD-10-CM | POA: Diagnosis not present

## 2022-05-06 NOTE — Progress Notes (Signed)
Remote pacemaker transmission.   

## 2022-05-11 ENCOUNTER — Other Ambulatory Visit: Payer: Self-pay | Admitting: Cardiology

## 2022-05-16 ENCOUNTER — Ambulatory Visit (HOSPITAL_COMMUNITY)
Admission: RE | Admit: 2022-05-16 | Discharge: 2022-05-16 | Disposition: A | Discharge status: Home / Self Care | Payer: PPO | Source: Ambulatory Visit | Attending: Urology | Admitting: Urology

## 2022-05-16 ENCOUNTER — Telehealth: Payer: Self-pay | Admitting: *Deleted

## 2022-05-16 DIAGNOSIS — Z87442 Personal history of urinary calculi: Secondary | ICD-10-CM | POA: Insufficient documentation

## 2022-05-16 DIAGNOSIS — R319 Hematuria, unspecified: Secondary | ICD-10-CM | POA: Diagnosis not present

## 2022-05-16 DIAGNOSIS — N200 Calculus of kidney: Secondary | ICD-10-CM | POA: Diagnosis not present

## 2022-05-16 DIAGNOSIS — N390 Urinary tract infection, site not specified: Secondary | ICD-10-CM | POA: Diagnosis not present

## 2022-05-16 NOTE — Telephone Encounter (Signed)
Received a letter from [employer] ( Eliquis) patient assistance stating that pt needs to show proof of income. Reached out to the pt who states that he does not want to provide that at this time. Informed pt that if he choose to provide that at a later date that I would be more that happy to fax that information to [employer].

## 2022-05-19 ENCOUNTER — Ambulatory Visit (INDEPENDENT_AMBULATORY_CARE_PROVIDER_SITE_OTHER): Payer: PPO | Admitting: Urology

## 2022-05-19 ENCOUNTER — Encounter: Payer: Self-pay | Admitting: Urology

## 2022-05-19 VITALS — BP 165/66 | HR 60 | Ht 72.5 in | Wt 241.0 lb

## 2022-05-19 DIAGNOSIS — N390 Urinary tract infection, site not specified: Secondary | ICD-10-CM | POA: Diagnosis not present

## 2022-05-19 DIAGNOSIS — N99112 Postprocedural membranous urethral stricture: Secondary | ICD-10-CM

## 2022-05-19 DIAGNOSIS — Z87440 Personal history of urinary (tract) infections: Secondary | ICD-10-CM | POA: Diagnosis not present

## 2022-05-19 DIAGNOSIS — R590 Localized enlarged lymph nodes: Secondary | ICD-10-CM | POA: Diagnosis not present

## 2022-05-19 DIAGNOSIS — R319 Hematuria, unspecified: Secondary | ICD-10-CM | POA: Diagnosis not present

## 2022-05-19 DIAGNOSIS — Z8546 Personal history of malignant neoplasm of prostate: Secondary | ICD-10-CM | POA: Diagnosis not present

## 2022-05-19 DIAGNOSIS — R3129 Other microscopic hematuria: Secondary | ICD-10-CM

## 2022-05-19 LAB — URINALYSIS, ROUTINE W REFLEX MICROSCOPIC
Bilirubin, UA: NEGATIVE
Glucose, UA: NEGATIVE
Leukocytes,UA: NEGATIVE
Nitrite, UA: NEGATIVE
Specific Gravity, UA: 1.02 (ref 1.005–1.030)
Urobilinogen, Ur: 1 mg/dL (ref 0.2–1.0)
pH, UA: 5.5 (ref 5.0–7.5)

## 2022-05-19 LAB — MICROSCOPIC EXAMINATION: Bacteria, UA: NONE SEEN

## 2022-05-19 MED ORDER — CIPROFLOXACIN HCL 500 MG PO TABS
500.0000 mg | ORAL_TABLET | Freq: Once | ORAL | Status: AC
  Administered 2022-05-19: 500 mg via ORAL

## 2022-05-19 NOTE — Progress Notes (Signed)
post void residual= 37 Subjective:  1. Postprocedural membranous urethral stricture   2. Personal history of urinary infection   3. Microhematuria   4. History of prostate cancer   5. Lymphadenopathy, retroperitoneal     01/31/20: Mr. Gatewood returns today in f/u from recent imaging.  He had a CT for the abdominal pain he had at his last visit and was found to have some small but slightly larger left iliac adenopathy.  He then had a PSMA PET that showed mild uptake in the nodes and this is felt to be either indolent nodal metastases vs slightly enlarged benign nodes.  His PSA remains <0.1 in 10/21.  He has continue to have some abdominal discomfort and has seen GI but a cause has not been clearly determined.  He has chronic constipation.    He is having SUI and uses paper towel.  He has urgency with UUI that has progressed.  He has nocturia x 3.   He had previously been on Oxybutynin and he was given samples of Gemtesa which helped a little bit.  He had UDS in the past that showed a 249ml bladder with some instability.   1/5/23[name] returns today in f/u.  His PSA prior to this visit remains undetectible.  A repeat CT [facility] on 01/26/21 showed no further adenopathy or other concerning findings.  He is going to have a CABG on 03/09/21 by Dr. Kipp Brood.  He had been having DOE.  He has persistent MUI.  He didn't get much benefit from oxybutynin or myrbetriq.  He is using a paper towel in the underwear and has moderate nocturia.  He has had no hematuria.  He had a seed implant remotely.    1/4/24[name] returns today in f/u. His PSA remains <0.1.  Chest CT in 7/23 showed no concerning findings.  He has a history of MUI with DI. He had a CABG in 1/23. He remains on Eliquis. He had to have a pacemaker in November.  He has stable LUTS with an IPSS of 20 with nocturia 4x and some intermittency and dribbling. He has some MUI.  He has occ dysuria but no hematuria. He had a DVT in the left arm and the Eliquis dose was  increased.  His UA has >30 WBC, 3-10 RBC and moderate bacteria.  He has CKD 3b with a Cr of 1.73 in 10/23.  2/1/24[name] returns today in f/u following treatment of a UTI.  His culture grew serratia and he was switched from keflex to bactrim.   He has the occasional sharp pain in the penis.  He has persistent frequency and nocturia x 4.  He has some urgency with occasional UUI.  He has no hematuria. His Cr on 03/02/22 was 1.85.  He got a little better with the bactrim but his UA looks infected today.  He has a single stone remotely.    3/28/24[name] returns today in f/u from a CT scan for cystoscopy.  The CT showed no obvious cause of the UTI's or hematuria. There was a punctate renal stone.  He remains on TMP for suppression.  He has 3-10 RBC's on UA today.  His PSA was <0.1 on 03/24/22. He had some stable enlarged lymph nodes and borderline dilation of the pulmonary trunk on the CT as well.          ROS:  ROS:  A complete review of systems was performed.  All systems are negative except for pertinent findings as  noted.   Review of Systems  Constitutional:  Negative for chills and fever.  Genitourinary:  Positive for frequency and urgency.    No Known Allergies  Outpatient Encounter Medications as of 05/19/2022  Medication Sig   acetaminophen (TYLENOL) 500 MG tablet Take 500 mg by mouth every 6 (six) hours as needed (for pain.).   amLODipine (NORVASC) 5 MG tablet TAKE ONE TABLET (5MG  TOTAL) BY MOUTH DAILY   apixaban (ELIQUIS) 2.5 MG TABS tablet Take 1 tablet (2.5 mg total) by mouth 2 (two) times daily.   atorvastatin (LIPITOR) 20 MG tablet TAKE ONE (1) TABLET BY MOUTH EVERY DAY   ezetimibe (ZETIA) 10 MG tablet Take 10 mg by mouth at bedtime.   fluticasone (FLONASE) 50 MCG/ACT nasal spray Place 1 spray into both nostrils daily.   trimethoprim (TRIMPEX) 100 MG tablet Take 1 tablet (100 mg total) by mouth at bedtime.   [DISCONTINUED] sulfamethoxazole-trimethoprim (BACTRIM DS) 800-160 MG  tablet Take 0.5 tablets by mouth every 12 (twelve) hours.   [EXPIRED] ciprofloxacin (CIPRO) tablet 500 mg    No facility-administered encounter medications on file as of 05/19/2022.    Past Medical History:  Diagnosis Date   Arthritis    CAD (coronary artery disease)    Two-vessel obstructive disease November 2022; CABG with LIMA to LAD and SVG to PDA January 2023   Chronic back pain    CKD (chronic kidney disease) stage 3, GFR 30-59 ml/min (HCC)    Essential hypertension    GERD (gastroesophageal reflux disease)    Helicobacter pylori gastritis    Treated   History of kidney stones    Hx of adenomatous colonic polyps    IBS (irritable bowel syndrome)    PAF (paroxysmal atrial fibrillation) (HCC)    Prostate CA (HCC)    Seed implants   Schatzki's ring    Stroke [facility])     Past Surgical History:  Procedure Laterality Date   APPENDECTOMY  age 23   BACK SURGERY  02/08   [facility]   BACK SURGERY  09/24/04   lumbar, [facility]   BACK SURGERY  09/18/02   [facility]   BILIARY STENT PLACEMENT  01/07/2012   Procedure: BILIARY STENT PLACEMENT;  Surgeon: Rogene Houston, MD;  Location: [facility];  Service: Endoscopy;  Laterality: N/A;   BILIARY STENT PLACEMENT  02/21/2012   Procedure: BILIARY STENT PLACEMENT;  Surgeon: Rogene Houston, MD;  Location: [facility];  Service: Endoscopy;  Laterality: N/A;  Biliary Stent Replacement   CATARACT EXTRACTION W/PHACO  11/22/2010   Procedure: CATARACT EXTRACTION PHACO AND INTRAOCULAR LENS PLACEMENT ([place]);  Surgeon: Williams Che;  Location: [facility];  Service: Ophthalmology;  Laterality: Right;  CDE: 6.81   CHOLECYSTECTOMY  03/04/05   [facility], [facility]. Gangrenous cholecystitis complicated by abscess requiring percutaneous drainage. He also had common duct stones requiring ERCP and sphincterotomy.   COLONOSCOPY  August 2005   Scattered sigmoid diverticulosis, splenic flexure polyp. Hyperplastic   COLONOSCOPY  1997   3 cm tubular adenoma and a sending colon   COLONOSCOPY   02/01/2011   Rourk-friable anal canal, hyperplastic rectal polyp   COLONOSCOPY N/A 08/15/2013   Procedure: COLONOSCOPY;  Surgeon: Rogene Houston, MD;  [facility];  Service: Endoscopy;  Laterality: N/A;  100   COLONOSCOPY N/A 09/20/2018   Procedure: COLONOSCOPY;  Surgeon: Rogene Houston, MD;  [facility];  Service: Endoscopy;  Laterality: N/A;  12:00pm   CORONARY ARTERY BYPASS GRAFT N/A 03/09/2021  Procedure: OFF PUMP CORONARY ARTERY BYPASS GRAFTING (CABG) X2, USING LEFT INTERNAL MAMMARY ARTERY AND RIGHT LEG GREATER SAPHENOUS VEIN HARVESTED ENDOSCOPICALLY;  Surgeon: Lajuana Matte, MD;  Location: [place];  Service: Open Heart Surgery;  Laterality: N/A;  X 2   ERCP  03/02/05   [facility], [facility]. Normal-appearing biliary tree (gallbladder not image), status post sphincterotomy with recovery of small pieces of stone material, status post balloon occlusion cholangiogram.   ERCP  01/07/2012   Procedure: ENDOSCOPIC RETROGRADE CHOLANGIOPANCREATOGRAPHY (ERCP);  Surgeon: Rogene Houston, MD;  Location: [facility];  Service: Endoscopy;  Laterality: N/A;  possible biliary stenting   ERCP  02/21/2012   Procedure: ENDOSCOPIC RETROGRADE CHOLANGIOPANCREATOGRAPHY (ERCP);  Surgeon: Rogene Houston, MD;  [facility];  Service: Endoscopy;  Laterality: N/A;  common bile duct stone and clip removed   ERCP N/A 05/18/2012   Procedure: ENDOSCOPIC RETROGRADE CHOLANGIOPANCREATOGRAPHY (ERCP);  Surgeon: Rogene Houston, MD;  [facility];  Service: Endoscopy;  Laterality: N/A;  stone and debri removal   ESOPHAGOGASTRODUODENOSCOPY  August 2005   Erosive esophagitis and Schatzki ring, small hiatal hernia   ESOPHAGOGASTRODUODENOSCOPY  01/2008   Mild reflux esophagitis, small hiatal hernia   ESOPHAGOGASTRODUODENOSCOPY  02/01/2011   Rourk-erosive reflux esophagitis, small hiatal hernia   ESOPHAGOGASTRODUODENOSCOPY  10/26/2011   Dr. Eli Phillips gastric submucosal petechia (bx-benign ulceration),  juxta ampullary duodenal diverticulum and some mucosal edema involving 1st/2nd portion of duodenum with superficial erosions (bx-superficial ulceration/benign)   ESOPHAGOGASTRODUODENOSCOPY  02/21/2012   Procedure: ESOPHAGOGASTRODUODENOSCOPY (EGD);  Surgeon: Rogene Houston, MD;  [facility];  Service: Endoscopy;  Laterality: N/A;   EYE SURGERY     Incomplete colonoscopy  December 2009   Left-sided diverticula, mid descending colon polyp, due to recurrent looping and redundancy exam was incomplete. It was felt that the mid colon was reached. Followup barium enema showed colon interposition between the liver and the diaphragm, redundant sigmoid colon but no colon mass or polyp identified.. Pathology revealed tubular adenoma.   LEFT HEART CATH AND CORONARY ANGIOGRAPHY N/A 01/18/2021   Procedure: LEFT HEART CATH AND CORONARY ANGIOGRAPHY;  Surgeon: Lorretta Harp, MD;  Location: [place] [facility];  Service: Cardiovascular;  Laterality: N/A;   PACEMAKER IMPLANT N/A 12/22/2021   Procedure: PACEMAKER IMPLANT;  Surgeon: Evans Lance, MD;  Location: [place] [facility];  Service: Cardiovascular;  Laterality: N/A;   POLYPECTOMY  09/20/2018   Procedure: POLYPECTOMY;  Surgeon: Rogene Houston, MD;  [facility];  Service: Endoscopy;;  Hepatic flexure polyp cold snare, splenic flexure polyp   SPHINCTEROTOMY  01/07/2012   Procedure: SPHINCTEROTOMY;  Surgeon: Rogene Houston, MD;  [facility];  Service: Endoscopy;  Laterality: N/A;  Extended   SPYGLASS CHOLANGIOSCOPY  02/21/2012   Procedure: SPYGLASS CHOLANGIOSCOPY;  Surgeon: Rogene Houston, MD;  [facility];  Service: Endoscopy;  Laterality: N/A;   SPYGLASS CHOLANGIOSCOPY N/A 05/18/2012   Procedure: SPYGLASS CHOLANGIOSCOPY;  Surgeon: Rogene Houston, MD;  Location[facility];  Service: Endoscopy;  Laterality: N/A;   TEE WITHOUT CARDIOVERSION N/A 03/09/2021   Procedure: TRANSESOPHAGEAL ECHOCARDIOGRAM (TEE);  Surgeon: Lajuana Matte, MD;  Location: [place];  Service: Open Heart Surgery;  Laterality: N/A;   TOTAL KNEE ARTHROPLASTY  03/21/2012   Procedure: TOTAL KNEE ARTHROPLASTY;  Surgeon: [name], MD;  Location: [place];  Service: Orthopedics;  Laterality: Right;  right knee arthroplasty   TOTAL KNEE ARTHROPLASTY Left 04/08/2019   Procedure: LEFT TOTAL KNEE ARTHROPLASTY;  Surgeon: Frederik Pear, MD;  Location: [facility];  Service: Orthopedics;  Laterality: Left;    Social History   Socioeconomic History   Marital status: Married    Spouse name: Not on file   Number of children: 2   Years of education: Not on file   Highest education level: Not on file  Occupational History   Occupation: Retired Chemical engineer: RETIRED  Tobacco Use   Smoking status: Never   Smokeless tobacco: Never  Vaping Use   Vaping Use: Never used  Substance and Sexual Activity   Alcohol use: No    Alcohol/week: 0.0 standard drinks of alcohol   Drug use: No   Sexual activity: Not on file  Other Topics Concern   Not on file  Social History Narrative   Not on file   Social Determinants of Health   Financial Resource Strain: Not on file  Food Insecurity: Not on file  Transportation Needs: Not on file  Physical Activity: Not on file  Stress: Not on file  Social Connections: Not on file  Intimate Partner Violence: Not on file    Family History  Problem Relation Age of Onset   Diabetes Mother    Coronary artery disease Mother    Coronary artery disease Father    Diabetes Father    Stomach cancer Father    Deep vein thrombosis Daughter    Hypotension Neg Hx    Anesthesia problems Neg Hx    Malignant hyperthermia Neg Hx    Pseudochol deficiency Neg Hx    Colon cancer Neg Hx        Objective: Vitals:   05/19/22 1314  BP: (!) 165/66  Pulse: 60     Physical Exam Vitals reviewed.  Constitutional:      Appearance: Normal appearance.  Neurological:     Mental Status: He is alert.     Lab Results:  No  results found for this or any previous visit (from the past 24 hour(s)).     BMET No results for input(s): "NA", "K", "CL", "CO2", "GLUCOSE", "BUN", "CREATININE", "CALCIUM" in the last 72 hours. PSA <0.1 in 9/20. <0.1 in 10/21 <0.1 in 12/22 <0.1 in 12/23 UA reviewed.  He appears to have a UTI.    Studies/Results: CT RENAL STONE STUDY  Result Date: 05/17/2022 CLINICAL DATA:  Low pelvic pain and left flank pain. Urinary tract infection with hematuria. Prostate cancer. EXAM: CT ABDOMEN AND PELVIS WITHOUT CONTRAST TECHNIQUE: Multidetector CT imaging of the abdomen and pelvis was performed following the standard protocol without IV contrast. RADIATION DOSE REDUCTION: This exam was performed according to the departmental dose-optimization program which includes automated exposure control, adjustment of the mA and/or kV according to patient size and/or use of iterative reconstruction technique. COMPARISON:  01/26/2021. FINDINGS: Lower chest: Bibasilar pulmonary parenchymal scarring. No suspicious pulmonary nodules. Atherosclerotic calcification of the aorta and aortic valve. Enlarged pulmonic trunk. Heart is at the upper limits of normal in size. No pericardial or pleural effusion. Distal esophagus is grossly unremarkable. Hepatobiliary: Liver is unremarkable. Cholecystectomy. No biliary ductal dilatation. Pancreas: Negative. Spleen: Negative. Adrenals/Urinary Tract: Adrenal glands and right kidney are unremarkable. Punctate left renal stone. Ureters are decompressed. Bladder is low in volume. Stomach/Bowel: Stomach is unremarkable. Duodenal diverticulum is incidentally noted. Small bowel, appendix and colon are otherwise unremarkable. Vascular/Lymphatic: Atherosclerotic calcification of the aorta. Small to borderline enlarged retroperitoneal lymph nodes measure up to 1.3 cm in the left external iliac station, unchanged from 01/26/2021. Reproductive:  Brachytherapy seeds in the prostate. Other: No free  fluid. Mesenteries and peritoneum are unremarkable. Small bilateral inguinal hernias contain fat. Musculoskeletal: Thoraco lumbosacral fusion. No worrisome lytic or sclerotic lesions. IMPRESSION: 1. No findings to explain the patient's clinical history. 2. No evidence of metastatic disease. 3. Left renal stone. 4.  Aortic atherosclerosis (ICD10-I70.0). 5. Enlarged pulmonic trunk, indicative of pulmonary arterial hypertension. Electronically Signed   By: Lorin Picket M.D.   On: 05/17/2022 13:26   CUP PACEART INCLINIC DEVICE CHECK  Result Date: 04/19/2022 Pacemaker check in clinic. Normal device function. Thresholds, sensing, impedances consistent with previous measurements. Device programmed to maximize longevity. No mode switch or high ventricular rates noted. Device programmed at appropriate safety margins. Patient reports feeling poorly since PPM implant.  He has no energy and cannot even do his normal walking workout in the mornings without giving out.  Reviewed with Dr. Lovena Le: The following changes were made to support rate response for patient: 1.  Prorgrammed rate response on so mode changed to DDDR from DDD 2. PAV delay changed from 243ms to 266ms 3. PVARP changed from 269ms to 249ms 4.  Reaction time from Medium to Fast 5. Slope kept the same at 8 6. Sensor from passive to ON 7. Threshold AUTO (-0.5) TO AUTO (+2.0) 8. Pacing threshold adjustments made in respect of 91 day mark : RA 2.0@.5 and RV 2.5@.5. Device programmed to optimize intrinsic conduction. Estimated longevity 8.3 - 9.7 years . Patient enrolled in remote follow-up. Patient education completed.Leticia Penna, RN  CUP PACEART REMOTE DEVICE CHECK  Result Date: 03/29/2022 Scheduled remote reviewed. Normal device function.  Next remote 91 days. [place]  CUP PACEART INCLINIC DEVICE CHECK  Result Date: 03/02/2022 Pacemaker check in clinic. Normal device function. Thresholds, sensing, impedances consistent with previous measurements. Device  programmed to maximize longevity. No mode switch or high ventricular rates noted. Device programmed at appropriate safety margins. Histogram distribution appropriate for patient activity level. Estimated longevity 7 yr, 3 mo Patient enrolled in remote follow-up. Patient education completed.   Procedure: Flexible cystoscopy.  He was prepped with betadine and 2% lidocaine and given Cipro 500mg  po.  The urethral was normal with the exception of a mild/mod membranous stricture with radiation blanching and a small amoutn of dystrophic changes.  The prostate was short with blanching of the mucosa and minimal obstruction.  The bladder has mild/mod trabeculation with a small diverticulum on the dome.  UO's are normal.    Assessment & Plan: UTI with hematuria.  His urine has cleared with the exception of 3-10 RBC's on the antibiotic.  I will have him stay on TMP for now and f/u in 6 months.  Membranous stricture from radiation.   I will monitor his flow.   History of prostate cancer.  He will need a PSA in December.   History of stones.  He had only a punctate stone on CT.   Meds ordered this encounter  Medications   ciprofloxacin (CIPRO) tablet 500 mg     Orders Placed This Encounter  Procedures   Urinalysis, Routine w reflex microscopic      Return in about 6 months (around 11/19/2022).   CC: Celene Squibb, MD      Irine Seal 05/19/2022 Patient ID: Dian Situ, male   DOB: [birth year], 82 y.o.   MRN: [id number]

## 2022-05-20 ENCOUNTER — Encounter: Payer: Self-pay | Admitting: Urology

## 2022-06-16 ENCOUNTER — Ambulatory Visit: Payer: PPO | Attending: Cardiology | Admitting: Cardiology

## 2022-06-16 ENCOUNTER — Encounter: Payer: Self-pay | Admitting: Cardiology

## 2022-06-16 VITALS — BP 120/58 | HR 60 | Ht 74.0 in | Wt 235.0 lb

## 2022-06-16 DIAGNOSIS — N1832 Chronic kidney disease, stage 3b: Secondary | ICD-10-CM | POA: Diagnosis not present

## 2022-06-16 DIAGNOSIS — I951 Orthostatic hypotension: Secondary | ICD-10-CM

## 2022-06-16 DIAGNOSIS — I25119 Atherosclerotic heart disease of native coronary artery with unspecified angina pectoris: Secondary | ICD-10-CM

## 2022-06-16 DIAGNOSIS — I4819 Other persistent atrial fibrillation: Secondary | ICD-10-CM

## 2022-06-16 DIAGNOSIS — E782 Mixed hyperlipidemia: Secondary | ICD-10-CM | POA: Diagnosis not present

## 2022-06-16 DIAGNOSIS — I10 Essential (primary) hypertension: Secondary | ICD-10-CM

## 2022-06-16 MED ORDER — AMLODIPINE BESYLATE 2.5 MG PO TABS: 2.5000 mg | ORAL_TABLET | Freq: Every day | ORAL | 3 refills | Status: DC

## 2022-06-16 NOTE — Progress Notes (Signed)
Cardiology Office Note  Date: 06/16/2022   ID: MASYN ROSTRO, DOB [birth year], MRN [id number]  History of Present Illness: [name] is an 82 y.o. male last seen in July 2023 and with interval follow-up by EP.  He is here for a routine visit.  Reports no angina and stable NYHA class II dyspnea on exertion.  No palpitations.  He does have orthostatic lightheadedness at times, feels like things are going "black," but he does not have frank syncope and feels better when he sits down.  Blood pressure is low normal on current regimen.  He is status post placement of a St. Jude dual-chamber pacemaker due to sinus node dysfunction and symptomatic bradycardia back in November 2023 by Dr. Ladona Ridgel.  We went over his medications today and discussed reducing Norvasc dose.  He does not report any spontaneous bleeding problems on Eliquis and remains on stable lipid regimen with recent LDL 36.  Physical Exam: VS:  BP (!) 120/58   Pulse 60   Ht  (1.88 m)   Wt 235 lb (106.6 kg)   SpO2 96%   BMI 30.17 kg/m , BMI Body mass index is 30.17 kg/m.  Wt Readings from Last 3 Encounters:  06/16/22 235 lb (106.6 kg)  05/19/22 241 lb (109.3 kg)  04/19/22 241 lb (109.3 kg)    General: Patient appears comfortable at rest. HEENT: Conjunctiva and lids normal. Neck: Supple, no elevated JVP or carotid bruits. Lungs: Clear to auscultation, nonlabored breathing at rest. Thorax: Stable pacer pocket site. Cardiac: Regular rate and rhythm, no S3, 1/6 systolic murmur. Extremities: No pitting edema.  ECG:  An ECG dated 12/22/2021 was personally reviewed today and demonstrated:  Sinus rhythm with prolonged PR interval, right bundle branch block, left anterior fascicular block.  Labwork: 03/02/2022: Hemoglobin 14.7; Platelets 180 03/24/2022: BUN 27; Creatinine, Ser 1.74; Potassium 4.5; Sodium 140  March 2024: TSH 4.45, hemoglobin 14.8, platelets 168, BUN 25, creatinine 1.73, potassium 4.2, AST 37, ALT 36,  cholesterol 92, triglycerides 84, HDL 39, LDL 36, hemoglobin A1c 5.8%  Other Studies Reviewed Today:  Echocardiogram 11/10/2021:  1. Left ventricular ejection fraction, by estimation, is 55 to 60%. The  left ventricle has normal function. The left ventricle demonstrates  regional wall motion abnormalities (see scoring diagram/findings for  description). Left ventricular diastolic  parameters are consistent with Grade I diastolic dysfunction (impaired  relaxation).   2. Right ventricular systolic function is normal. The right ventricular  size is mildly enlarged.   3. Left atrial size was mildly dilated.   4. The mitral valve is abnormal. Trivial mitral valve regurgitation.   5. The aortic valve has an indeterminant number of cusps. There is  moderate calcification of the aortic valve. Aortic valve regurgitation is  trivial. Aortic valve sclerosis/calcification is present, without any  evidence of aortic stenosis. Aortic valve  mean gradient measures 8.0 mmHg.   6. Aortic dilatation noted. There is mild dilatation of the ascending  aorta, measuring 38 mm.   Assessment and Plan:  1.  CAD status post CABG in January 2023 including LIMA to LAD and SVG to PDA.  LVEF 55 to 60% by echocardiogram in September 2023.  He reports no active angina at this time.  Not on aspirin given use of Eliquis.  Continue Lipitor and Zetia.  2.  Sinus node dysfunction with symptomatic bradycardia status post St. Jude pacemaker in November 2023 with followed by Dr. Ladona Ridgel.  3.  Paroxysmal atrial fibrillation with CHA2DS2-VASc  score of at least 5.  He is on Eliquis for stroke prophylaxis.  No spontaneous bleeding problems reported.  I went over his most recent lab work.  4.  Frequent PVCs, 7% rhythm burden by cardiac monitor in September 2023.  Not bothered by palpitations at this time.  5.  Essential hypertension.  Given his orthostatic symptoms, we will reduce Norvasc to 2.5 mg daily.  6.  CKD stage IIIb.   Creatinine 1.73 in March.  7.  Mixed hyperlipidemia, LDL 36 in March.  Continue Lipitor and Zetia.  8.  Aortic valve sclerosis with mean AV gradient 8 mmHg and mildly dilated ascending aorta at 38 mm by echocardiogram in September 2023.  Disposition:  Follow up  6 months.  Signed, Jonelle Sidle, M.D., F.A.C.C. [facility]

## 2022-06-16 NOTE — Patient Instructions (Signed)
Medication Instructions:   DECREASE Amlodipine to 2.5 mg daily  Labwork: None today  Testing/Procedures: None today  Follow-Up: 6 months  Any Other Special Instructions Will Be Listed Below (If Applicable).  If you need a refill on your cardiac medications before your next appointment, please call your pharmacy.

## 2022-06-27 ENCOUNTER — Ambulatory Visit (INDEPENDENT_AMBULATORY_CARE_PROVIDER_SITE_OTHER): Payer: PPO

## 2022-06-27 DIAGNOSIS — I495 Sick sinus syndrome: Secondary | ICD-10-CM

## 2022-06-27 LAB — CUP PACEART REMOTE DEVICE CHECK
Battery Remaining Longevity: 105 mo
Battery Remaining Percentage: 95.5 %
Battery Voltage: 3.01 V
Brady Statistic AP VP Percent: 1 %
Brady Statistic AP VS Percent: 78 %
Brady Statistic AS VP Percent: 1.2 %
Brady Statistic AS VS Percent: 17 %
Brady Statistic RA Percent Paced: 74 %
Brady Statistic RV Percent Paced: 2.1 %
Date Time Interrogation Session: 20240506020014
Implantable Lead Connection Status: 753985
Implantable Lead Connection Status: 753985
Implantable Lead Implant Date: 20231101
Implantable Lead Implant Date: 20231101
Implantable Lead Location: 753859
Implantable Lead Location: 753860
Implantable Pulse Generator Implant Date: 20231101
Lead Channel Impedance Value: 400 Ohm
Lead Channel Impedance Value: 460 Ohm
Lead Channel Pacing Threshold Amplitude: 0.5 V
Lead Channel Pacing Threshold Amplitude: 0.75 V
Lead Channel Pacing Threshold Pulse Width: 0.5 ms
Lead Channel Pacing Threshold Pulse Width: 0.5 ms
Lead Channel Sensing Intrinsic Amplitude: 12 mV
Lead Channel Sensing Intrinsic Amplitude: 5 mV
Lead Channel Setting Pacing Amplitude: 2 V
Lead Channel Setting Pacing Amplitude: 2.5 V
Lead Channel Setting Pacing Pulse Width: 0.5 ms
Lead Channel Setting Sensing Sensitivity: 2 mV
Pulse Gen Model: 2272
Pulse Gen Serial Number: 8115159

## 2022-07-04 DIAGNOSIS — W010XXA Fall on same level from slipping, tripping and stumbling without subsequent striking against object, initial encounter: Secondary | ICD-10-CM | POA: Diagnosis not present

## 2022-07-04 DIAGNOSIS — M25521 Pain in right elbow: Secondary | ICD-10-CM | POA: Diagnosis not present

## 2022-07-04 DIAGNOSIS — W19XXXA Unspecified fall, initial encounter: Secondary | ICD-10-CM | POA: Diagnosis not present

## 2022-07-04 DIAGNOSIS — Z2839 Other underimmunization status: Secondary | ICD-10-CM | POA: Diagnosis not present

## 2022-07-22 NOTE — Progress Notes (Signed)
Remote pacemaker transmission.   

## 2022-07-26 ENCOUNTER — Other Ambulatory Visit (HOSPITAL_COMMUNITY): Payer: Self-pay | Admitting: Internal Medicine

## 2022-07-26 DIAGNOSIS — R0609 Other forms of dyspnea: Secondary | ICD-10-CM | POA: Diagnosis not present

## 2022-07-26 DIAGNOSIS — I2510 Atherosclerotic heart disease of native coronary artery without angina pectoris: Secondary | ICD-10-CM | POA: Diagnosis not present

## 2022-07-26 DIAGNOSIS — R55 Syncope and collapse: Secondary | ICD-10-CM

## 2022-07-26 DIAGNOSIS — N189 Chronic kidney disease, unspecified: Secondary | ICD-10-CM | POA: Diagnosis not present

## 2022-07-26 DIAGNOSIS — I480 Paroxysmal atrial fibrillation: Secondary | ICD-10-CM | POA: Diagnosis not present

## 2022-07-26 DIAGNOSIS — W19XXXA Unspecified fall, initial encounter: Secondary | ICD-10-CM

## 2022-07-26 DIAGNOSIS — I129 Hypertensive chronic kidney disease with stage 1 through stage 4 chronic kidney disease, or unspecified chronic kidney disease: Secondary | ICD-10-CM | POA: Diagnosis not present

## 2022-07-26 DIAGNOSIS — R2232 Localized swelling, mass and lump, left upper limb: Secondary | ICD-10-CM | POA: Diagnosis not present

## 2022-07-26 DIAGNOSIS — Z951 Presence of aortocoronary bypass graft: Secondary | ICD-10-CM | POA: Diagnosis not present

## 2022-07-26 DIAGNOSIS — I495 Sick sinus syndrome: Secondary | ICD-10-CM | POA: Diagnosis not present

## 2022-07-26 DIAGNOSIS — D6869 Other thrombophilia: Secondary | ICD-10-CM | POA: Diagnosis not present

## 2022-08-04 ENCOUNTER — Ambulatory Visit (INDEPENDENT_AMBULATORY_CARE_PROVIDER_SITE_OTHER): Payer: PPO | Admitting: Internal Medicine

## 2022-08-04 ENCOUNTER — Encounter: Payer: Self-pay | Admitting: Internal Medicine

## 2022-08-04 VITALS — BP 122/66 | HR 60 | Ht 74.0 in | Wt 232.0 lb

## 2022-08-04 DIAGNOSIS — R55 Syncope and collapse: Secondary | ICD-10-CM | POA: Diagnosis not present

## 2022-08-04 LAB — CUP PACEART INCLINIC DEVICE CHECK
Battery Remaining Longevity: 111 mo
Battery Voltage: 3.01 V
Brady Statistic RA Percent Paced: 73 %
Brady Statistic RV Percent Paced: 2.9 %
Date Time Interrogation Session: 20240613124643
Implantable Lead Connection Status: 753985
Implantable Lead Connection Status: 753985
Implantable Lead Implant Date: 20231101
Implantable Lead Implant Date: 20231101
Implantable Lead Location: 753859
Implantable Lead Location: 753860
Implantable Pulse Generator Implant Date: 20231101
Lead Channel Impedance Value: 400 Ohm
Lead Channel Impedance Value: 462.5 Ohm
Lead Channel Pacing Threshold Amplitude: 0.5 V
Lead Channel Pacing Threshold Amplitude: 0.5 V
Lead Channel Pacing Threshold Amplitude: 0.5 V
Lead Channel Pacing Threshold Amplitude: 0.5 V
Lead Channel Pacing Threshold Pulse Width: 0.5 ms
Lead Channel Pacing Threshold Pulse Width: 0.5 ms
Lead Channel Pacing Threshold Pulse Width: 0.5 ms
Lead Channel Pacing Threshold Pulse Width: 0.5 ms
Lead Channel Sensing Intrinsic Amplitude: 12 mV
Lead Channel Sensing Intrinsic Amplitude: 5 mV
Lead Channel Setting Pacing Amplitude: 2 V
Lead Channel Setting Pacing Amplitude: 2.5 V
Lead Channel Setting Pacing Pulse Width: 0.5 ms
Lead Channel Setting Sensing Sensitivity: 2 mV
Pulse Gen Model: 2272
Pulse Gen Serial Number: 8115159

## 2022-08-04 NOTE — Patient Instructions (Signed)
Medication Instructions:  STOP Norvasc (amlodipine)  Labwork: None today  Testing/Procedures: None today  Follow-Up: 1 year  Any Other Special Instructions Will Be Listed Below (If Applicable).  If you need a refill on your cardiac medications before your next appointment, please call your pharmacy.

## 2022-08-04 NOTE — Progress Notes (Signed)
HPI [name] returns for evaluation of symptomatic bradycardia due to sinus node dysfunction,  s/p PPM insertion. He is a pleasant 82 yo man with CAD, s/p CABG in 1/23.  He has PAF which is well controlled. He has class 2 dyspnea. He denies anginal symptoms.  He especially notes difficulty with exertion. He has had a couple of episodes where he was working outside and passed out. His amlopine was reduced.  No Known Allergies   Current Outpatient Medications  Medication Sig Dispense Refill   acetaminophen (TYLENOL) 500 MG tablet Take 500 mg by mouth every 6 (six) hours as needed (for pain.).     amLODipine (NORVASC) 2.5 MG tablet Take 1 tablet (2.5 mg total) by mouth daily. 90 tablet 3   apixaban (ELIQUIS) 2.5 MG TABS tablet Take 1 tablet (2.5 mg total) by mouth 2 (two) times daily. 60 tablet 5   atorvastatin (LIPITOR) 20 MG tablet TAKE ONE (1) TABLET BY MOUTH EVERY DAY 90 tablet 1   ezetimibe (ZETIA) 10 MG tablet Take 10 mg by mouth at bedtime.     fluticasone (FLONASE) 50 MCG/ACT nasal spray Place 1 spray into both nostrils daily.     trimethoprim (TRIMPEX) 100 MG tablet Take 1 tablet (100 mg total) by mouth at bedtime. 30 tablet 5   No current facility-administered medications for this visit.     Past Medical History:  Diagnosis Date   Arthritis    CAD (coronary artery disease)    Two-vessel obstructive disease November 2022; CABG with LIMA to LAD and SVG to PDA January 2023   Chronic back pain    CKD (chronic kidney disease) stage 3, GFR 30-59 ml/min (HCC)    Essential hypertension    GERD (gastroesophageal reflux disease)    Helicobacter pylori gastritis    Treated   History of kidney stones    Hx of adenomatous colonic polyps    IBS (irritable bowel syndrome)    PAF (paroxysmal atrial fibrillation) (HCC)    Prostate CA (HCC)    Seed implants   Schatzki's ring    Stroke (HCC)     ROS:   All systems reviewed and negative except as noted in the HPI.   Past  Surgical History:  Procedure Laterality Date   APPENDECTOMY  age 3   BACK SURGERY  02/08   [facility]   BACK SURGERY  09/24/04   lumbar, [facility]   BACK SURGERY  09/18/02   [facility]   BILIARY STENT PLACEMENT  01/07/2012   Procedure: BILIARY STENT PLACEMENT;  Surgeon: Malissa Hippo, MD;  Location: [facility];  Service: Endoscopy;  Laterality: N/A;   BILIARY STENT PLACEMENT  02/21/2012   Procedure: BILIARY STENT PLACEMENT;  Surgeon: Malissa Hippo, MD;  Location: [facility];  Service: Endoscopy;  Laterality: N/A;  Biliary Stent Replacement   CATARACT EXTRACTION W/PHACO  11/22/2010   Procedure: CATARACT EXTRACTION PHACO AND INTRAOCULAR LENS PLACEMENT (IOC);  Surgeon: Susa Simmonds;  Location: [facility];  Service: Ophthalmology;  Laterality: Right;  CDE: 6.81   CHOLECYSTECTOMY  03/04/05   [facility], [facility]. Gangrenous cholecystitis complicated by abscess requiring percutaneous drainage. He also had common duct stones requiring ERCP and sphincterotomy.   COLONOSCOPY  August 2005   Scattered sigmoid diverticulosis, splenic flexure polyp. Hyperplastic   COLONOSCOPY  1997   3 cm tubular adenoma and a sending colon   COLONOSCOPY  02/01/2011   Rourk-friable anal canal, hyperplastic rectal polyp   COLONOSCOPY N/A 08/15/2013  Procedure: COLONOSCOPY;  Surgeon: Malissa Hippo, MD;  [facility]: [facility];  Service: Endoscopy;  Laterality: N/A;  100   COLONOSCOPY N/A 09/20/2018   Procedure: COLONOSCOPY;  Surgeon: Malissa Hippo, MD;  [facility]: [facility];  Service: Endoscopy;  Laterality: N/A;  12:00pm   CORONARY ARTERY BYPASS GRAFT N/A 03/09/2021   Procedure: OFF PUMP CORONARY ARTERY BYPASS GRAFTING (CABG) X2, USING LEFT INTERNAL MAMMARY ARTERY AND RIGHT LEG GREATER SAPHENOUS VEIN HARVESTED ENDOSCOPICALLY;  Surgeon: Corliss Skains, MD;  Location: [facility];  Service: Open Heart Surgery;  Laterality: N/A;  X 2   ERCP  03/02/05   [facility], [facility]. Normal-appearing biliary tree (gallbladder not image), status post sphincterotomy  with recovery of small pieces of stone material, status post balloon occlusion cholangiogram.   ERCP  01/07/2012   Procedure: ENDOSCOPIC RETROGRADE CHOLANGIOPANCREATOGRAPHY (ERCP);  Surgeon: Malissa Hippo, MD;  Location: [facility];  Service: Endoscopy;  Laterality: N/A;  possible biliary stenting   ERCP  02/21/2012   Procedure: ENDOSCOPIC RETROGRADE CHOLANGIOPANCREATOGRAPHY (ERCP);  Surgeon: Malissa Hippo, MD;  Location: [facility];  Service: Endoscopy;  Laterality: N/A;  common bile duct stone and clip removed   ERCP N/A 05/18/2012   Procedure: ENDOSCOPIC RETROGRADE CHOLANGIOPANCREATOGRAPHY (ERCP);  Surgeon: Malissa Hippo, MD;  Location: [facility];  Service: Endoscopy;  Laterality: N/A;  stone and debri removal   ESOPHAGOGASTRODUODENOSCOPY  August 2005   Erosive esophagitis and Schatzki ring, small hiatal hernia   ESOPHAGOGASTRODUODENOSCOPY  01/2008   Mild reflux esophagitis, small hiatal hernia   ESOPHAGOGASTRODUODENOSCOPY  02/01/2011   Rourk-erosive reflux esophagitis, small hiatal hernia   ESOPHAGOGASTRODUODENOSCOPY  10/26/2011   Dr. Kerby Moors gastric submucosal petechia (bx-benign ulceration), juxta ampullary duodenal diverticulum and some mucosal edema involving 1st/2nd portion of duodenum with superficial erosions (bx-superficial ulceration/benign)   ESOPHAGOGASTRODUODENOSCOPY  02/21/2012   Procedure: ESOPHAGOGASTRODUODENOSCOPY (EGD);  Surgeon: Malissa Hippo, MD;  Location: [facility];  Service: Endoscopy;  Laterality: N/A;   EYE SURGERY     Incomplete colonoscopy  December 2009   Left-sided diverticula, mid descending colon polyp, due to recurrent looping and redundancy exam was incomplete. It was felt that the mid colon was reached. Followup barium enema showed colon interposition between the liver and the diaphragm, redundant sigmoid colon but no colon mass or polyp identified.. Pathology revealed tubular adenoma.   LEFT HEART CATH AND CORONARY ANGIOGRAPHY N/A 01/18/2021   Procedure: LEFT  HEART CATH AND CORONARY ANGIOGRAPHY;  Surgeon: Runell Gess, MD;  [facility]: [facility];  Service: Cardiovascular;  Laterality: N/A;   PACEMAKER IMPLANT N/A 12/22/2021   Procedure: PACEMAKER IMPLANT;  Surgeon: Marinus Maw, MD;  [facility]: [facility];  Service: Cardiovascular;  Laterality: N/A;   POLYPECTOMY  09/20/2018   Procedure: POLYPECTOMY;  Surgeon: Malissa Hippo, MD;  [facility];  Service: Endoscopy;;  Hepatic flexure polyp cold snare, splenic flexure polyp   SPHINCTEROTOMY  01/07/2012   Procedure: SPHINCTEROTOMY;  Surgeon: Malissa Hippo, MD;  Location: [facility];  Service: Endoscopy;  Laterality: N/A;  Extended   SPYGLASS CHOLANGIOSCOPY  02/21/2012   Procedure: SPYGLASS CHOLANGIOSCOPY;  Surgeon: Malissa Hippo, MD;  Location: [facility];  Service: Endoscopy;  Laterality: N/A;   SPYGLASS CHOLANGIOSCOPY N/A 05/18/2012   Procedure: SPYGLASS CHOLANGIOSCOPY;  Surgeon: Malissa Hippo, MD;  Location: [facility];  Service: Endoscopy;  Laterality: N/A;   TEE WITHOUT CARDIOVERSION N/A 03/09/2021   Procedure: TRANSESOPHAGEAL ECHOCARDIOGRAM (TEE);  Surgeon: Corliss Skains, MD;  Location: [facility];  Service: Open Heart Surgery;  Laterality: N/A;   TOTAL KNEE ARTHROPLASTY  03/21/2012   Procedure: TOTAL KNEE ARTHROPLASTY;  Surgeon: [name], MD;  Location: [facility];  Service: Orthopedics;  Laterality: Right;  right knee arthroplasty   TOTAL KNEE ARTHROPLASTY Left 04/08/2019   Procedure: LEFT TOTAL KNEE ARTHROPLASTY;  Surgeon: Gean Birchwood, MD;  Location: [facility];  Service: Orthopedics;  Laterality: Left;     Family History  Problem Relation Age of Onset   Diabetes Mother    Coronary artery disease Mother    Coronary artery disease Father    Diabetes Father    Stomach cancer Father    Deep vein thrombosis Daughter    Hypotension Neg Hx    Anesthesia problems Neg Hx    Malignant hyperthermia Neg Hx    Pseudochol deficiency Neg Hx    Colon cancer Neg Hx       Social History   Socioeconomic History   Marital status: Married    Spouse name: Not on file   Number of children: 2   Years of education: Not on file   Highest education level: Not on file  Occupational History   Occupation: Retired Careers information officer: RETIRED  Tobacco Use   Smoking status: Never   Smokeless tobacco: Never  Vaping Use   Vaping Use: Never used  Substance and Sexual Activity   Alcohol use: No    Alcohol/week: 0.0 standard drinks of alcohol   Drug use: No   Sexual activity: Not on file  Other Topics Concern   Not on file  Social History Narrative   Not on file   Social Determinants of Health   Financial Resource Strain: Not on file  Food Insecurity: Not on file  Transportation Needs: Not on file  Physical Activity: Not on file  Stress: Not on file  Social Connections: Not on file  Intimate Partner Violence: Not on file     BP 122/66   Pulse 60   Ht 6\' 2"  (1.88 m)   Wt 232 lb (105.2 kg)   SpO2 96%   BMI 29.79 kg/m   Physical Exam:  Well appearing NAD HEENT: Unremarkable Neck:  No JVD, no thyromegally Lymphatics:  No adenopathy Back:  No CVA tenderness Lungs:  Clear with no wheezes HEART:  Regular rate rhythm, no murmurs, no rubs, no clicks Abd:  soft, positive bowel sounds, no organomegally, no rebound, no guarding Ext:  2 plus pulses, no edema, no cyanosis, no clubbing Skin:  No rashes no nodules Neuro:  CN II through XII intact, motor grossly intact  DEVICE  Normal device function.  See PaceArt for details.   Assess/Plan:  PAF - he is mostly maintaining NSR. I have discussed stopping his eliquis but recommend he continue.   Symptomatic sinus node dysfunction - he has undergone PPM insertion. Today we have turned on rate response.    3. PVC's - these appear to be better controlled.   4. CAD - he is s/p CABG and denies anginal symptoms. We will follow.  5. Syncope - It is unclear if this is due to orthostasis or if he  is having SVT as the cause. I asked him to stop his amlodipine as his bp is normal today. I favor orthostasis as the cause.   Walter Gowda Johnjoseph Rolfe,MD

## 2022-08-05 ENCOUNTER — Ambulatory Visit (HOSPITAL_COMMUNITY)
Admission: RE | Admit: 2022-08-05 | Discharge: 2022-08-05 | Disposition: A | Discharge status: Home / Self Care | Payer: PPO | Source: Ambulatory Visit | Attending: Internal Medicine | Admitting: Internal Medicine

## 2022-08-05 DIAGNOSIS — R55 Syncope and collapse: Secondary | ICD-10-CM | POA: Insufficient documentation

## 2022-08-05 DIAGNOSIS — I480 Paroxysmal atrial fibrillation: Secondary | ICD-10-CM | POA: Insufficient documentation

## 2022-08-05 DIAGNOSIS — I2510 Atherosclerotic heart disease of native coronary artery without angina pectoris: Secondary | ICD-10-CM | POA: Insufficient documentation

## 2022-08-05 DIAGNOSIS — Z79899 Other long term (current) drug therapy: Secondary | ICD-10-CM | POA: Insufficient documentation

## 2022-08-05 DIAGNOSIS — W19XXXA Unspecified fall, initial encounter: Secondary | ICD-10-CM | POA: Insufficient documentation

## 2022-08-05 DIAGNOSIS — I495 Sick sinus syndrome: Secondary | ICD-10-CM | POA: Insufficient documentation

## 2022-08-05 DIAGNOSIS — Z043 Encounter for examination and observation following other accident: Secondary | ICD-10-CM | POA: Diagnosis not present

## 2022-08-05 DIAGNOSIS — Z951 Presence of aortocoronary bypass graft: Secondary | ICD-10-CM | POA: Insufficient documentation

## 2022-08-05 DIAGNOSIS — Z950 Presence of cardiac pacemaker: Secondary | ICD-10-CM | POA: Insufficient documentation

## 2022-08-05 DIAGNOSIS — M542 Cervicalgia: Secondary | ICD-10-CM | POA: Diagnosis not present

## 2022-08-05 DIAGNOSIS — I6523 Occlusion and stenosis of bilateral carotid arteries: Secondary | ICD-10-CM | POA: Diagnosis not present

## 2022-09-09 ENCOUNTER — Other Ambulatory Visit (HOSPITAL_COMMUNITY): Payer: Self-pay | Admitting: Family Medicine

## 2022-09-09 ENCOUNTER — Ambulatory Visit (HOSPITAL_COMMUNITY)
Admission: RE | Admit: 2022-09-09 | Discharge: 2022-09-09 | Disposition: A | Discharge status: Home / Self Care | Payer: PPO | Source: Ambulatory Visit | Attending: Family Medicine | Admitting: Family Medicine

## 2022-09-09 ENCOUNTER — Encounter (HOSPITAL_COMMUNITY): Payer: Self-pay | Admitting: Family Medicine

## 2022-09-09 DIAGNOSIS — R2232 Localized swelling, mass and lump, left upper limb: Secondary | ICD-10-CM | POA: Diagnosis not present

## 2022-09-09 DIAGNOSIS — M79642 Pain in left hand: Secondary | ICD-10-CM

## 2022-09-09 DIAGNOSIS — M25512 Pain in left shoulder: Secondary | ICD-10-CM

## 2022-09-09 DIAGNOSIS — M19042 Primary osteoarthritis, left hand: Secondary | ICD-10-CM | POA: Diagnosis not present

## 2022-09-09 DIAGNOSIS — M25521 Pain in right elbow: Secondary | ICD-10-CM | POA: Diagnosis not present

## 2022-09-09 DIAGNOSIS — M19021 Primary osteoarthritis, right elbow: Secondary | ICD-10-CM | POA: Diagnosis not present

## 2022-09-09 DIAGNOSIS — Z7901 Long term (current) use of anticoagulants: Secondary | ICD-10-CM | POA: Diagnosis not present

## 2022-09-09 DIAGNOSIS — M1812 Unilateral primary osteoarthritis of first carpometacarpal joint, left hand: Secondary | ICD-10-CM | POA: Diagnosis not present

## 2022-09-09 DIAGNOSIS — Z950 Presence of cardiac pacemaker: Secondary | ICD-10-CM | POA: Diagnosis not present

## 2022-09-09 DIAGNOSIS — R55 Syncope and collapse: Secondary | ICD-10-CM | POA: Diagnosis not present

## 2022-09-12 ENCOUNTER — Other Ambulatory Visit (HOSPITAL_COMMUNITY): Payer: Self-pay | Admitting: Surgery

## 2022-09-12 DIAGNOSIS — M5030 Other cervical disc degeneration, unspecified cervical region: Secondary | ICD-10-CM | POA: Diagnosis not present

## 2022-09-12 DIAGNOSIS — Z6829 Body mass index (BMI) 29.0-29.9, adult: Secondary | ICD-10-CM | POA: Diagnosis not present

## 2022-09-12 DIAGNOSIS — M542 Cervicalgia: Secondary | ICD-10-CM | POA: Diagnosis not present

## 2022-09-22 DIAGNOSIS — W19XXXA Unspecified fall, initial encounter: Secondary | ICD-10-CM | POA: Diagnosis not present

## 2022-09-22 DIAGNOSIS — S01512A Laceration without foreign body of oral cavity, initial encounter: Secondary | ICD-10-CM | POA: Diagnosis not present

## 2022-09-26 ENCOUNTER — Ambulatory Visit (INDEPENDENT_AMBULATORY_CARE_PROVIDER_SITE_OTHER): Payer: PPO

## 2022-09-26 DIAGNOSIS — I495 Sick sinus syndrome: Secondary | ICD-10-CM | POA: Diagnosis not present

## 2022-09-26 LAB — CUP PACEART REMOTE DEVICE CHECK
Battery Remaining Longevity: 102 mo
Battery Remaining Percentage: 94 %
Battery Voltage: 3.01 V
Brady Statistic AP VP Percent: 1.8 %
Brady Statistic AP VS Percent: 81 %
Brady Statistic AS VP Percent: 1.2 %
Brady Statistic AS VS Percent: 13 %
Brady Statistic RA Percent Paced: 77 %
Brady Statistic RV Percent Paced: 3 %
Date Time Interrogation Session: 20240805020314
Implantable Lead Connection Status: 753985
Implantable Lead Connection Status: 753985
Implantable Lead Implant Date: 20231101
Implantable Lead Implant Date: 20231101
Implantable Lead Location: 753859
Implantable Lead Location: 753860
Implantable Pulse Generator Implant Date: 20231101
Lead Channel Impedance Value: 400 Ohm
Lead Channel Impedance Value: 460 Ohm
Lead Channel Pacing Threshold Amplitude: 0.5 V
Lead Channel Pacing Threshold Amplitude: 0.5 V
Lead Channel Pacing Threshold Pulse Width: 0.5 ms
Lead Channel Pacing Threshold Pulse Width: 0.5 ms
Lead Channel Sensing Intrinsic Amplitude: 12 mV
Lead Channel Sensing Intrinsic Amplitude: 5 mV
Lead Channel Setting Pacing Amplitude: 2 V
Lead Channel Setting Pacing Amplitude: 2.5 V
Lead Channel Setting Pacing Pulse Width: 0.5 ms
Lead Channel Setting Sensing Sensitivity: 2 mV
Pulse Gen Model: 2272
Pulse Gen Serial Number: 8115159

## 2022-10-06 NOTE — Progress Notes (Signed)
Remote pacemaker transmission.   

## 2022-10-31 DIAGNOSIS — E039 Hypothyroidism, unspecified: Secondary | ICD-10-CM | POA: Diagnosis not present

## 2022-10-31 DIAGNOSIS — I129 Hypertensive chronic kidney disease with stage 1 through stage 4 chronic kidney disease, or unspecified chronic kidney disease: Secondary | ICD-10-CM | POA: Diagnosis not present

## 2022-10-31 DIAGNOSIS — R7303 Prediabetes: Secondary | ICD-10-CM | POA: Diagnosis not present

## 2022-10-31 DIAGNOSIS — Z8546 Personal history of malignant neoplasm of prostate: Secondary | ICD-10-CM | POA: Diagnosis not present

## 2022-11-01 ENCOUNTER — Other Ambulatory Visit: Payer: Self-pay | Admitting: Cardiology

## 2022-11-01 ENCOUNTER — Telehealth: Payer: Self-pay | Admitting: Internal Medicine

## 2022-11-01 NOTE — Telephone Encounter (Signed)
Attempted to reach,no answer,voicemail full. I have mailed patient assistance forms to him.

## 2022-11-01 NOTE — Telephone Encounter (Signed)
Prescription refill request for Eliquis received. Indication: PAF Last office visit: 08/04/22  Rosette Reveal MD Scr: 1.74 on 03/24/22  Epic Age: 82 Weight: 105.2kg  Based on above findings Eliquis 2.5mg  twice daily is the appropriate dose.  Refill approved.

## 2022-11-01 NOTE — Telephone Encounter (Signed)
Patient states that he cannot afford Eliquis.Wants to know if he can be put on a different blood thinner.

## 2022-11-02 NOTE — Telephone Encounter (Signed)
Patient notified that patient assistance application for Eliquis has been mailed to him. Pt very thankful. Pt will call our office back if he has any questions or concerns regarding application.

## 2022-11-04 DIAGNOSIS — R0609 Other forms of dyspnea: Secondary | ICD-10-CM | POA: Diagnosis not present

## 2022-11-04 DIAGNOSIS — Z951 Presence of aortocoronary bypass graft: Secondary | ICD-10-CM | POA: Diagnosis not present

## 2022-11-04 DIAGNOSIS — I129 Hypertensive chronic kidney disease with stage 1 through stage 4 chronic kidney disease, or unspecified chronic kidney disease: Secondary | ICD-10-CM | POA: Diagnosis not present

## 2022-11-04 DIAGNOSIS — R7303 Prediabetes: Secondary | ICD-10-CM | POA: Diagnosis not present

## 2022-11-04 DIAGNOSIS — N1832 Chronic kidney disease, stage 3b: Secondary | ICD-10-CM | POA: Diagnosis not present

## 2022-11-04 DIAGNOSIS — Z23 Encounter for immunization: Secondary | ICD-10-CM | POA: Diagnosis not present

## 2022-11-04 DIAGNOSIS — M25511 Pain in right shoulder: Secondary | ICD-10-CM | POA: Diagnosis not present

## 2022-11-04 DIAGNOSIS — D6869 Other thrombophilia: Secondary | ICD-10-CM | POA: Diagnosis not present

## 2022-11-04 DIAGNOSIS — R2232 Localized swelling, mass and lump, left upper limb: Secondary | ICD-10-CM | POA: Diagnosis not present

## 2022-11-04 DIAGNOSIS — E782 Mixed hyperlipidemia: Secondary | ICD-10-CM | POA: Diagnosis not present

## 2022-11-04 DIAGNOSIS — Z8546 Personal history of malignant neoplasm of prostate: Secondary | ICD-10-CM | POA: Diagnosis not present

## 2022-11-04 DIAGNOSIS — I2510 Atherosclerotic heart disease of native coronary artery without angina pectoris: Secondary | ICD-10-CM | POA: Diagnosis not present

## 2022-11-08 ENCOUNTER — Other Ambulatory Visit: Payer: Self-pay | Admitting: Cardiology

## 2022-11-10 ENCOUNTER — Ambulatory Visit (INDEPENDENT_AMBULATORY_CARE_PROVIDER_SITE_OTHER): Payer: PPO | Admitting: Gastroenterology

## 2022-11-10 ENCOUNTER — Encounter (INDEPENDENT_AMBULATORY_CARE_PROVIDER_SITE_OTHER): Payer: Self-pay | Admitting: Gastroenterology

## 2022-11-10 VITALS — BP 163/64 | HR 60 | Temp 96.6°F | Ht 74.0 in | Wt 237.5 lb

## 2022-11-10 DIAGNOSIS — K581 Irritable bowel syndrome with constipation: Secondary | ICD-10-CM | POA: Diagnosis not present

## 2022-11-10 NOTE — Patient Instructions (Signed)
I suspect your abdominal pain is related to IBS/Constipation. Please stop stool softeners. I am providing samples of linzess daily to take.  Start taking Linzess 1 capsule 30 minutes before breakfast daily. It is normal to have some looser stool starting out for the first few days, but this should improve. Please let me know if you have more than 4 watery BMs per day as we may need to adjust the dose. Keep me updated on how this is working for you so that I can send a prescription if you are having good results with it. Continue to ensure good water intake, aim for around 64 oz per day   Follow up 3 months  It was a pleasure to see you today. I want to create trusting relationships with patients and provide genuine, compassionate, and quality care. I truly value your feedback! please be on the lookout for a survey regarding your visit with me today. I appreciate your input about our visit and your time in completing this!    Ayyan Sites L. Jeanmarie Hubert, MSN, APRN, AGNP-C Adult-Gerontology Nurse Practitioner [facility]

## 2022-11-10 NOTE — Progress Notes (Addendum)
Referring Provider: Benita Stabile, MD Primary Care Physician:  Benita Stabile, MD Primary GI Physician: Dr. Levon Hedger   Chief Complaint  Patient presents with   Follow-up    Patient here today to discuss having a Tcs. The last one was done 09/20/2018 by Dr. Karilyn Cota. He says he is having issues with lower abdominal pain ongoing for a while, but worsening. He says he is unable to sleep at night due to the pain. He is not taking any current medications for this issue of abdominal pain. He is having some issues with constipation. He says he takes colace three po bid and this has not helped   HPI:   [name] is a 82 y.o. male with past medical history of prostate cancer s/p radiation seeds, CKD, hypertension, GERD, IBS, Schatzki's ring   Patient presenting today for abdominal pain and constipation   Last seen 2021. At that time having lower abdominal pain. Taking colace and miralax.   Recommended low FODMAP guide and IBGard  Present:  Patient states he continues to have lower abdominal pain. Ongoing for the past few months. He notes that pain is all the time. Has started to have pain at night that keeps him awake. Does not feel that eating changes his pain. He is not taking anything for pain. He has some constipation, taking stool softeners, 3 in the morning and 3 at night and having a BM maybe 3 times per week. Notes stools remain hard even on stool softeners. He note pain may improve mildly with a good BM. No rectal bleeding or melena. He denies any changes in appetite or weight loss. He has taken miralax in the past that did not seem to help much. He notes constipation has been a chronic issue for him. No nausea or vomiting. Water intake is good.    Virtual Colonoscopy: 10/2018 No significant colonic polyp, mass, apple core lesion, or stricture. Brachytherapy seeds in the prostate. No evidence of recurrent or metastatic disease 2020- Perianal skin tags found on perianal exam.                            - Incomplete exam to hepatic flexure.                           - One 7 mm polyp at the hepatic flexure, removed                            with a cold snare. Resected and retrieved.                           - One 5 mm polyp at the splenic flexure, removed                            with a cold snare. Resected and retrieved.                           - Diverticulosis in the sigmoid colon.                           - External hemorrhoids tubular adenoma and the other one is sessile serrated polyp. His exam was incomplete  to hepatic flexure.  Past Medical History:  Diagnosis Date   Arthritis    CAD (coronary artery disease)    Two-vessel obstructive disease November 2022; CABG with LIMA to LAD and SVG to PDA January 2023   Chronic back pain    CKD (chronic kidney disease) stage 3, GFR 30-59 ml/min (HCC)    Essential hypertension    GERD (gastroesophageal reflux disease)    Helicobacter pylori gastritis    Treated   History of kidney stones    Hx of adenomatous colonic polyps    IBS (irritable bowel syndrome)    PAF (paroxysmal atrial fibrillation) (HCC)    Prostate CA (HCC)    Seed implants   Schatzki's ring    Stroke [facility])     Past Surgical History:  Procedure Laterality Date   APPENDECTOMY  age 27   BACK SURGERY  02/08   [facility]   BACK SURGERY  09/24/04   lumbar, [facility]   BACK SURGERY  09/18/02   [facility]   BILIARY STENT PLACEMENT  01/07/2012   Procedure: BILIARY STENT PLACEMENT;  Surgeon: Malissa Hippo, MD;  Location: [facility] ORS;  Service: Endoscopy;  Laterality: N/A;   BILIARY STENT PLACEMENT  02/21/2012   Procedure: BILIARY STENT PLACEMENT;  Surgeon: Malissa Hippo, MD;  Location: [facility] ORS;  Service: Endoscopy;  Laterality: N/A;  Biliary Stent Replacement   CATARACT EXTRACTION W/PHACO  11/22/2010   Procedure: CATARACT EXTRACTION PHACO AND INTRAOCULAR LENS PLACEMENT (IOC);  Surgeon: Susa Simmonds;  Location: [facility] ORS;  Service: Ophthalmology;  Laterality: Right;  CDE:  6.81   CHOLECYSTECTOMY  03/04/05   [facility], [facility]. Gangrenous cholecystitis complicated by abscess requiring percutaneous drainage. He also had common duct stones requiring ERCP and sphincterotomy.   COLONOSCOPY  August 2005   Scattered sigmoid diverticulosis, splenic flexure polyp. Hyperplastic   COLONOSCOPY  1997   3 cm tubular adenoma and a sending colon   COLONOSCOPY  02/01/2011   Rourk-friable anal canal, hyperplastic rectal polyp   COLONOSCOPY N/A 08/15/2013   Procedure: COLONOSCOPY;  Surgeon: Malissa Hippo, MD;  Location: [facility] END[facility];  Service: Endoscopy;  Laterality: N/A;  100   COLONOSCOPY N/A 09/20/2018   Procedure: COLONOSCOPY;  Surgeon: Malissa Hippo, MD;  Location: [facility];  Service: Endoscopy;  Laterality: N/A;  12:00pm   CORONARY ARTERY BYPASS GRAFT N/A 03/09/2021   Procedure: OFF PUMP CORONARY ARTERY BYPASS GRAFTING (CABG) X2, USING LEFT INTERNAL MAMMARY ARTERY AND RIGHT LEG GREATER SAPHENOUS VEIN HARVESTED ENDOSCOPICALLY;  Surgeon: Corliss Skains, MD;  Location: [facility] OR;  Service: Open Heart Surgery;  Laterality: N/A;  X 2   ERCP  03/02/05   [facility], [facility]. Normal-appearing biliary tree (gallbladder not image), status post sphincterotomy with recovery of small pieces of stone material, status post balloon occlusion cholangiogram.   ERCP  01/07/2012   Procedure: ENDOSCOPIC RETROGRADE CHOLANGIOPANCREATOGRAPHY (ERCP);  Surgeon: Malissa Hippo, MD;  Location: [facility];  Service: Endoscopy;  Laterality: N/A;  possible biliary stenting   ERCP  02/21/2012   Procedure: ENDOSCOPIC RETROGRADE CHOLANGIOPANCREATOGRAPHY (ERCP);  Surgeon: Malissa Hippo, MD;  Location: [facility];  Service: Endoscopy;  Laterality: N/A;  common bile duct stone and clip removed   ERCP N/A 05/18/2012   Procedure: ENDOSCOPIC RETROGRADE CHOLANGIOPANCREATOGRAPHY (ERCP);  Surgeon: Malissa Hippo, MD;  Location: [facility];  Service: Endoscopy;  Laterality: N/A;  stone and debri removal   ESOPHAGOGASTRODUODENOSCOPY   August 2005   Erosive esophagitis and Schatzki ring, small hiatal hernia  ESOPHAGOGASTRODUODENOSCOPY  01/2008   Mild reflux esophagitis, small hiatal hernia   ESOPHAGOGASTRODUODENOSCOPY  02/01/2011   Rourk-erosive reflux esophagitis, small hiatal hernia   ESOPHAGOGASTRODUODENOSCOPY  10/26/2011   Dr. Kerby Moors gastric submucosal petechia (bx-benign ulceration), juxta ampullary duodenal diverticulum and some mucosal edema involving 1st/2nd portion of duodenum with superficial erosions (bx-superficial ulceration/benign)   ESOPHAGOGASTRODUODENOSCOPY  02/21/2012   Procedure: ESOPHAGOGASTRODUODENOSCOPY (EGD);  Surgeon: Malissa Hippo, MD;  Location: [facility];  Service: Endoscopy;  Laterality: N/A;   EYE SURGERY     Incomplete colonoscopy  December 2009   Left-sided diverticula, mid descending colon polyp, due to recurrent looping and redundancy exam was incomplete. It was felt that the mid colon was reached. Followup barium enema showed colon interposition between the liver and the diaphragm, redundant sigmoid colon but no colon mass or polyp identified.. Pathology revealed tubular adenoma.   LEFT HEART CATH AND CORONARY ANGIOGRAPHY N/A 01/18/2021   Procedure: LEFT HEART CATH AND CORONARY ANGIOGRAPHY;  Surgeon: Runell Gess, MD;  [facility] INV[facility];  Service: Cardiovascular;  Laterality: N/A;   PACEMAKER IMPLANT N/A 12/22/2021   Procedure: PACEMAKER IMPLANT;  Surgeon: Marinus Maw, MD;  [facility]: [facility];  Service: Cardiovascular;  Laterality: N/A;   POLYPECTOMY  09/20/2018   Procedure: POLYPECTOMY;  Surgeon: Malissa Hippo, MD;  [facility];  Service: Endoscopy;;  Hepatic flexure polyp cold snare, splenic flexure polyp   SPHINCTEROTOMY  01/07/2012   Procedure: SPHINCTEROTOMY;  Surgeon: Malissa Hippo, MD;  Location: [facility];  Service: Endoscopy;  Laterality: N/A;  Extended   SPYGLASS CHOLANGIOSCOPY  02/21/2012   Procedure: SPYGLASS CHOLANGIOSCOPY;   Surgeon: Malissa Hippo, MD;  Location: [facility];  Service: Endoscopy;  Laterality: N/A;   SPYGLASS CHOLANGIOSCOPY N/A 05/18/2012   Procedure: SPYGLASS CHOLANGIOSCOPY;  Surgeon: Malissa Hippo, MD;  Location: [facility];  Service: Endoscopy;  Laterality: N/A;   TEE WITHOUT CARDIOVERSION N/A 03/09/2021   Procedure: TRANSESOPHAGEAL ECHOCARDIOGRAM (TEE);  Surgeon: Corliss Skains, MD;  Location[facility];  Service: Open Heart Surgery;  Laterality: N/A;   TOTAL KNEE ARTHROPLASTY  03/21/2012   Procedure: TOTAL KNEE ARTHROPLASTY;  Surgeon: [name], MD;  Location: [facility] OR;  Service: Orthopedics;  Laterality: Right;  right knee arthroplasty   TOTAL KNEE ARTHROPLASTY Left 04/08/2019   Procedure: LEFT TOTAL KNEE ARTHROPLASTY;  Surgeon: Gean Birchwood, MD;  Location: [facility] ORS;  Service: Orthopedics;  Laterality: Left;    Current Outpatient Medications  Medication Sig Dispense Refill   acetaminophen (TYLENOL) 500 MG tablet Take 500 mg by mouth every 6 (six) hours as needed (for pain.).     atorvastatin (LIPITOR) 20 MG tablet TAKE ONE (1) TABLET BY MOUTH EVERY DAY 90 tablet 3   ELIQUIS 2.5 MG TABS tablet TAKE ONE TABLET (2.5 MG TOTAL) BY MOUTH TWO TIMES DAILY 60 tablet 5   ezetimibe (ZETIA) 10 MG tablet Take 10 mg by mouth at bedtime.     No current facility-administered medications for this visit.    Allergies as of 11/10/2022   (No Known Allergies)    Family History  Problem Relation Age of Onset   Diabetes Mother    Coronary artery disease Mother    Coronary artery disease Father    Diabetes Father    Stomach cancer Father    Deep vein thrombosis Daughter    Hypotension Neg Hx    Anesthesia problems Neg Hx    Malignant hyperthermia Neg Hx    Pseudochol  deficiency Neg Hx    Colon cancer Neg Hx     Social History   Socioeconomic History   Marital status: Married    Spouse name: Not on file   Number of children: 2   Years of education: Not on file   Highest education level: Not on file   Occupational History   Occupation: Retired Careers information officer: RETIRED  Tobacco Use   Smoking status: Never   Smokeless tobacco: Never  Vaping Use   Vaping status: Never Used  Substance and Sexual Activity   Alcohol use: No    Alcohol/week: 0.0 standard drinks of alcohol   Drug use: No   Sexual activity: Not on file  Other Topics Concern   Not on file  Social History Narrative   Not on file   Social Determinants of Health   Financial Resource Strain: Not on file  Food Insecurity: Not on file  Transportation Needs: Not on file  Physical Activity: Not on file  Stress: Not on file  Social Connections: Unknown (10/12/2022)   Received from [facility]   Social Network    Social Network: Not on file   Review of systems General: negative for malaise, night sweats, fever, chills, weight loss Neck: Negative for lumps, goiter, pain and significant neck swelling Resp: Negative for cough, wheezing, dyspnea at rest CV: Negative for chest pain, leg swelling, palpitations, orthopnea GI: denies melena, hematochezia, nausea, vomiting, diarrhea, dysphagia, odyonophagia, early satiety or unintentional weight loss. +constipation +lower abdominal pain  MSK: Negative for joint pain or swelling, back pain, and muscle pain. Derm: Negative for itching or rash Psych: Denies depression, anxiety, memory loss, confusion. No homicidal or suicidal ideation.  Heme: Negative for prolonged bleeding, bruising easily, and swollen nodes. Endocrine: Negative for cold or heat intolerance, polyuria, polydipsia and goiter. Neuro: negative for tremor, gait imbalance, syncope and seizures. The remainder of the review of systems is noncontributory.  Physical Exam: BP (!) 163/64 (BP Location: Left Arm, Patient Position: Sitting, Cuff Size: Large)   Pulse 60   Temp (!) 96.6 F (35.9 C) (Temporal)   Ht 6\' 2"  (1.88 m)   Wt 237 lb 8 oz (107.7 kg)   BMI 30.49 kg/m  General:   Alert and oriented. No  distress noted. Pleasant and cooperative.  Head:  Normocephalic and atraumatic. Eyes:  Conjuctiva clear without scleral icterus. Mouth:  Oral mucosa pink and moist. Good dentition. No lesions. Heart: Normal rate and rhythm, s1 and s2 heart sounds present.  Lungs: Clear lung sounds in all lobes. Respirations equal and unlabored. Abdomen:  +BS, soft, Mild TTP of diffuse lower abdomen. non-distended. No rebound or guarding. No HSM or masses noted. Derm: No palmar erythema or jaundice Msk:  Symmetrical without gross deformities. Normal posture. Extremities:  Without edema. Neurologic:  Alert and  oriented x4 Psych:  Alert and cooperative. Normal mood and affect.  Invalid input(s): "6 MONTHS"   ASSESSMENT: [name] is a 82 y.o. male presenting today for lower abdominal pain and constipation   Patient with chronic lower abdominal pain for the past few years.  He has had multiple CTs over that time without any definitive abnormalities to explain his pain other than some noted constipation.  He has no alarm symptoms.  He does note a lot of constipation with harder stools despite taking 6 stool softeners per day and only about 3 bowel movements per week.  Does note sometimes some improvement in lower abdominal pain with a  bowel movement.  No real suspicion for ischemia given no association to timing of eating.  Suspect his symptoms are secondary to IBS with constipation.  Recommend patient stop stool softeners, will start Linzess 145 mcg daily, I will provide samples of this.  He should continue with good water intake.  If his constipation improves and he continues to have lower abdominal discomfort we can discuss further evaluation.   PLAN:  Good water intake  2. Linzess samples daily, Rx if pt has good results  3. Consider repeat colonoscopy if constipation improves and pain persists   All questions were answered, patient verbalized understanding and is in agreement with plan as outlined  above.   Follow Up: 3 months   Anapaula Severt L. Jeanmarie Hubert, MSN, APRN, AGNP-C Adult-Gerontology Nurse Practitioner [facility]  I have reviewed the note and agree with the APP's assessment as described in this progress note  Katrinka Blazing, MD Gastroenterology and Hepatology [facility]

## 2022-11-14 ENCOUNTER — Ambulatory Visit (HOSPITAL_COMMUNITY)
Admission: RE | Admit: 2022-11-14 | Discharge: 2022-11-14 | Disposition: A | Discharge status: Home / Self Care | Payer: PPO | Source: Ambulatory Visit | Attending: Surgery | Admitting: Surgery

## 2022-11-14 DIAGNOSIS — M542 Cervicalgia: Secondary | ICD-10-CM | POA: Diagnosis not present

## 2022-11-14 DIAGNOSIS — M47811 Spondylosis without myelopathy or radiculopathy, occipito-atlanto-axial region: Secondary | ICD-10-CM | POA: Diagnosis not present

## 2022-11-14 DIAGNOSIS — M50223 Other cervical disc displacement at C6-C7 level: Secondary | ICD-10-CM | POA: Diagnosis not present

## 2022-11-14 DIAGNOSIS — M47812 Spondylosis without myelopathy or radiculopathy, cervical region: Secondary | ICD-10-CM | POA: Diagnosis not present

## 2022-11-14 DIAGNOSIS — M5124 Other intervertebral disc displacement, thoracic region: Secondary | ICD-10-CM | POA: Diagnosis not present

## 2022-11-21 ENCOUNTER — Telehealth: Payer: Self-pay | Admitting: Cardiology

## 2022-11-21 DIAGNOSIS — Z6829 Body mass index (BMI) 29.0-29.9, adult: Secondary | ICD-10-CM | POA: Diagnosis not present

## 2022-11-21 DIAGNOSIS — M5030 Other cervical disc degeneration, unspecified cervical region: Secondary | ICD-10-CM | POA: Diagnosis not present

## 2022-11-21 DIAGNOSIS — M542 Cervicalgia: Secondary | ICD-10-CM | POA: Diagnosis not present

## 2022-11-21 DIAGNOSIS — M4802 Spinal stenosis, cervical region: Secondary | ICD-10-CM | POA: Diagnosis not present

## 2022-11-21 NOTE — Telephone Encounter (Signed)
Pharmacy please advise on holding Eliquis prior to C7-T1 ESI scheduled for TBD. Thank you.

## 2022-11-21 NOTE — Telephone Encounter (Signed)
Pre-operative Risk Assessment    Patient Name: [name]  DOB: 1940-11-10 MRN: [id number]      Request for Surgical Clearance    Procedure:   C7-T1 ES1  Date of Surgery:  Clearance TBD                                 Surgeon:  Dr. Aileen Fass Surgeon's Group or Practice Name:  [facility] Phone number:  [phone] Fax number:  [phone]   Type of Clearance Requested:   - Medical  - Pharmacy:  Hold Apixaban (Eliquis) Discontinue for 3 days prior. Resume the day after.   Type of Anesthesia:  Not Indicated   Additional requests/questions:    Signed, Seymour Bars   11/21/2022, 12:48 PM

## 2022-11-21 NOTE — Telephone Encounter (Signed)
Patient with diagnosis of afib on Eliquis for anticoagulation.    Procedure:  C7-T1 ES1  Date of procedure: TBD   CHA2DS2-VASc Score = 7   This indicates a 11.2% annual risk of stroke. The patient's score is based upon: CHF History: 1 HTN History: 1 Diabetes History: 0 Stroke History: 2 Vascular Disease History: 1 Age Score: 2 Gender Score: 0      Patient had acute deep vein thrombosis involving the left subclavian vein after holding for pacemaker. He also has hx of CVA  Will need a 3 day hold for procedure. Will confirm with Dr. Ladona Ridgel or Dr. Diona Browner if they are ok with 3 day Eliquis hold given patient's history.  CrCl 49 ml/min  **This guidance is not considered finalized until pre-operative APP has relayed final recommendations.**

## 2022-11-21 NOTE — Telephone Encounter (Addendum)
Tried to reach the pt but VM full on cell and could not leave message. I then tried alternate # for the pt and no answer. Ok per Verdie Drown. NP to add pt to his Tues or Wed schedule this week for tele visit.

## 2022-11-21 NOTE — Telephone Encounter (Signed)
Name: [name]  DOB: [birth year]  MRN: [id number]  Primary Cardiologist: Nona Dell, MD   Preoperative team, please contact this patient and set up a phone call appointment for further preoperative risk assessment. Please obtain consent and complete medication review. Thank you for your help.  I confirm that guidance regarding antiplatelet and oral anticoagulation therapy has been completed and, if necessary, noted below.  Per Pharm.D. and Dr. Diona Browner patient can hold Eliquis 3 days prior to procedure and should restart postprocedure when surgically safe.  Patient also has a PPM and note will be routed to device clinic.  I also confirmed the patient resides in the state of [place]. As per [facility] Medical Board telemedicine laws, the patient must reside in the state in which the provider is licensed.   Napoleon Form, Leodis Rains, NP 11/21/2022, 2:00 PM [facility]

## 2022-11-22 NOTE — Telephone Encounter (Addendum)
Spoke with the patient who states he told his requested office he did not want to do procedure. Advised the patient to contact the requesting providers office if he changed his mind. He voiced understanding. Patient thanked me for calling.

## 2022-11-22 NOTE — Telephone Encounter (Signed)
Pt called back stating he told the requesting office he did not want to have this injection anymore.

## 2022-11-23 DIAGNOSIS — U071 COVID-19: Secondary | ICD-10-CM | POA: Diagnosis not present

## 2022-11-24 ENCOUNTER — Telehealth: Payer: Self-pay

## 2022-11-24 ENCOUNTER — Ambulatory Visit: Payer: PPO | Admitting: Urology

## 2022-11-24 NOTE — Telephone Encounter (Signed)
Just offer pt his next available

## 2022-11-24 NOTE — Telephone Encounter (Signed)
Patient had to cancel today's visit with Dr. Annabell Howells due to having COVID.   Needing to see Dr. Annabell Howells when he gets better.  No Availability.  Please advise.

## 2022-11-30 ENCOUNTER — Telehealth: Payer: Self-pay | Admitting: Cardiology

## 2022-11-30 MED ORDER — APIXABAN 2.5 MG PO TABS: 2.5000 mg | ORAL_TABLET | Freq: Two times a day (BID) | ORAL | 0 refills | Status: DC

## 2022-11-30 NOTE — Telephone Encounter (Signed)
Spoke to [place] with [employer] who advised pt was missing pgs 1 & 2 of his current tax return. Judie Grieve advised that these pages must be turned in to proceed with processing application.

## 2022-11-30 NOTE — Telephone Encounter (Signed)
Attempted to contact patient- no answer, voicemail full

## 2022-11-30 NOTE — Telephone Encounter (Signed)
Patient notified and verbalized understanding. Patient will bring tax return to office.

## 2022-11-30 NOTE — Telephone Encounter (Signed)
Pt came in office to pick up samples and stated that Dr. Diona Browner had taken him off of his blood pressure medication and he is having some issues. Pt stated he is having dizzy spells and has passed out three times. He also stated that his blood pressure what high when he saw his PCP. He is wanting to make Dr. Diona Browner aware and see if there is anything he wants to do. Best number to reach pt is [phone].

## 2022-11-30 NOTE — Telephone Encounter (Signed)
Patient is calling requesting an update on his eliquis application status.   Please advise.

## 2022-12-01 NOTE — Telephone Encounter (Signed)
Spoke to pt who stated that Dr. Ladona Ridgel stopped his Amlodipine at his last ov and since then bp has been elevated- pt unable to give readings.  Pt has appt with PCP on 10/11  Pt stated that he is having dizzy spells every now and then, but denies cp, palpitations, or sob.   Please advise.

## 2022-12-05 NOTE — Telephone Encounter (Signed)
If bp is up, amlodipine 2.5 mg daily

## 2022-12-07 MED ORDER — AMLODIPINE BESYLATE 2.5 MG PO TABS: 2.5000 mg | ORAL_TABLET | Freq: Every day | ORAL | 3 refills | Status: DC

## 2022-12-07 NOTE — Telephone Encounter (Signed)
Medication sent to pharmacy for pt. Pt notified via MyChart.

## 2022-12-12 DIAGNOSIS — X32XXXA Exposure to sunlight, initial encounter: Secondary | ICD-10-CM | POA: Diagnosis not present

## 2022-12-12 DIAGNOSIS — L820 Inflamed seborrheic keratosis: Secondary | ICD-10-CM | POA: Diagnosis not present

## 2022-12-12 DIAGNOSIS — C44622 Squamous cell carcinoma of skin of right upper limb, including shoulder: Secondary | ICD-10-CM | POA: Diagnosis not present

## 2022-12-12 DIAGNOSIS — L570 Actinic keratosis: Secondary | ICD-10-CM | POA: Diagnosis not present

## 2022-12-13 ENCOUNTER — Ambulatory Visit
Admission: EM | Admit: 2022-12-13 | Discharge: 2022-12-13 | Disposition: A | Discharge status: Home / Self Care | Payer: PPO | Attending: Nurse Practitioner | Admitting: Nurse Practitioner

## 2022-12-13 ENCOUNTER — Ambulatory Visit: Payer: PPO

## 2022-12-13 DIAGNOSIS — M25572 Pain in left ankle and joints of left foot: Secondary | ICD-10-CM

## 2022-12-13 DIAGNOSIS — M25472 Effusion, left ankle: Secondary | ICD-10-CM | POA: Diagnosis not present

## 2022-12-13 DIAGNOSIS — M19072 Primary osteoarthritis, left ankle and foot: Secondary | ICD-10-CM | POA: Diagnosis not present

## 2022-12-13 DIAGNOSIS — M2042 Other hammer toe(s) (acquired), left foot: Secondary | ICD-10-CM | POA: Diagnosis not present

## 2022-12-13 DIAGNOSIS — M2012 Hallux valgus (acquired), left foot: Secondary | ICD-10-CM | POA: Diagnosis not present

## 2022-12-13 DIAGNOSIS — W19XXXA Unspecified fall, initial encounter: Secondary | ICD-10-CM

## 2022-12-13 DIAGNOSIS — M79672 Pain in left foot: Secondary | ICD-10-CM

## 2022-12-13 NOTE — ED Provider Notes (Signed)
[name]    CSN: [id number] Arrival date & time: 12/13/22  1752      History   Chief Complaint Chief Complaint  Patient presents with   Fall    [name] is a 82 y.o. male.   The history is provided by the patient.   Patient presents for complaints of left foot and ankle pain that started after he fell last evening.  Patient states when he fell, his left foot was "under him."  Patient reports pain with weightbearing, although he has been able to walk.  He also reports increased swelling to the inside of the left ankle.  Patient denies numbness, tingling, or radiation of pain.  Patient does have at baseline a deformity noted to the second toe.    Past Medical History:  Diagnosis Date   Arthritis    CAD (coronary artery disease)    Two-vessel obstructive disease November 2022; CABG with LIMA to LAD and SVG to PDA January 2023   Chronic back pain    CKD (chronic kidney disease) stage 3, GFR 30-59 ml/min (HCC)    Essential hypertension    GERD (gastroesophageal reflux disease)    Helicobacter pylori gastritis    Treated   History of kidney stones    Hx of adenomatous colonic polyps    IBS (irritable bowel syndrome)    PAF (paroxysmal atrial fibrillation) (HCC)    Prostate CA (HCC)    Seed implants   Schatzki's ring    Stroke [facility])     Patient Active Problem List   Diagnosis Date Noted   Visit for wound check 03/22/2021   Acute postoperative anemia due to expected blood loss 03/10/2021   Bilateral atelectasis 03/10/2021   S/P CABG x 2 03/09/2021   CAD (coronary artery disease) 03/09/2021   GERD (gastroesophageal reflux disease) 03/09/2021   HTN (hypertension) 03/09/2021   Bloating 12/25/2019   Abdominal pain 12/25/2019   Colonic adenoma 05/01/2018   Degenerative arthritis of left knee 11/01/2017   Abnormal cardiac function test 09/22/2015   Chronic renal disease, stage III (HCC) 09/22/2015   Family history of coronary artery disease in  mother 09/22/2015   Bifascicular block 09/22/2015   Hypertrophy of breast 11/26/2014   AKI (acute kidney injury) (HCC) 01/24/2014   Impaired mobility and ADLs 12/05/2013   Bacteremia 11/15/2013   Spinal abscess (HCC) 11/15/2013   Thoracic kyphosis 10/28/2013   Hemiplegia of dominant side as late effect following cerebrovascular disease (HCC) 10/28/2013   Postoperative shock 10/28/2013   Acute diastolic congestive heart failure (HCC) 10/28/2013   Sciatica 10/18/2013   History of prostate cancer 10/08/2013   Left lumbar radiculopathy 10/08/2013   History of colonic polyps 07/23/2013   Osteoarthritis of right knee 03/22/2012   Common bile duct (CBD) stricture 01/16/2012   Epigastric pain 01/19/2011   Hx of adenomatous colonic polyps 01/19/2011   Irritable bowel syndrome with constipation 01/19/2011   Altered bowel function 01/19/2011   Rectal bleeding 01/19/2011   Thrombocytopenia (HCC) 01/19/2011    Past Surgical History:  Procedure Laterality Date   APPENDECTOMY  age 76   BACK SURGERY  02/08   [facility]   BACK SURGERY  09/24/04   lumbar, [facility]   BACK SURGERY  09/18/02   [facility]   BILIARY STENT PLACEMENT  01/07/2012   Procedure: BILIARY STENT PLACEMENT;  Surgeon: Malissa Hippo, MD;  Location: [facility] ORS;  Service: Endoscopy;  Laterality: N/A;   BILIARY STENT PLACEMENT  02/21/2012  Procedure: BILIARY STENT PLACEMENT;  Surgeon: Malissa Hippo, MD;  Location: [facility];  Service: Endoscopy;  Laterality: N/A;  Biliary Stent Replacement   CATARACT EXTRACTION W/PHACO  11/22/2010   Procedure: CATARACT EXTRACTION PHACO AND INTRAOCULAR LENS PLACEMENT (IOC);  Surgeon: Susa Simmonds;  Location: [facility];  Service: Ophthalmology;  Laterality: Right;  CDE: 6.81   CHOLECYSTECTOMY  03/04/05   [facility], [facility]. Gangrenous cholecystitis complicated by abscess requiring percutaneous drainage. He also had common duct stones requiring ERCP and sphincterotomy.   COLONOSCOPY  August 2005   Scattered sigmoid  diverticulosis, splenic flexure polyp. Hyperplastic   COLONOSCOPY  1997   3 cm tubular adenoma and a sending colon   COLONOSCOPY  02/01/2011   Rourk-friable anal canal, hyperplastic rectal polyp   COLONOSCOPY N/A 08/15/2013   Procedure: COLONOSCOPY;  Surgeon: Malissa Hippo, MD;  Location: [facility];  Service: Endoscopy;  Laterality: N/A;  100   COLONOSCOPY N/A 09/20/2018   Procedure: COLONOSCOPY;  Surgeon: Malissa Hippo, MD;  [facility]: [facility];  Service: Endoscopy;  Laterality: N/A;  12:00pm   CORONARY ARTERY BYPASS GRAFT N/A 03/09/2021   Procedure: OFF PUMP CORONARY ARTERY BYPASS GRAFTING (CABG) X2, USING LEFT INTERNAL MAMMARY ARTERY AND RIGHT LEG GREATER SAPHENOUS VEIN HARVESTED ENDOSCOPICALLY;  Surgeon: Corliss Skains, MD;  Location: [facility] OR;  Service: Open Heart Surgery;  Laterality: N/A;  X 2   ERCP  03/02/05   [facility], [facility]. Normal-appearing biliary tree (gallbladder not image), status post sphincterotomy with recovery of small pieces of stone material, status post balloon occlusion cholangiogram.   ERCP  01/07/2012   Procedure: ENDOSCOPIC RETROGRADE CHOLANGIOPANCREATOGRAPHY (ERCP);  Surgeon: Malissa Hippo, MD;  Location: [facility];  Service: Endoscopy;  Laterality: N/A;  possible biliary stenting   ERCP  02/21/2012   Procedure: ENDOSCOPIC RETROGRADE CHOLANGIOPANCREATOGRAPHY (ERCP);  Surgeon: Malissa Hippo, MD;  Location: [facility];  Service: Endoscopy;  Laterality: N/A;  common bile duct stone and clip removed   ERCP N/A 05/18/2012   Procedure: ENDOSCOPIC RETROGRADE CHOLANGIOPANCREATOGRAPHY (ERCP);  Surgeon: Malissa Hippo, MD;  Location: [facility];  Service: Endoscopy;  Laterality: N/A;  stone and debri removal   ESOPHAGOGASTRODUODENOSCOPY  August 2005   Erosive esophagitis and Schatzki ring, small hiatal hernia   ESOPHAGOGASTRODUODENOSCOPY  01/2008   Mild reflux esophagitis, small hiatal hernia   ESOPHAGOGASTRODUODENOSCOPY  02/01/2011   Rourk-erosive reflux esophagitis, small  hiatal hernia   ESOPHAGOGASTRODUODENOSCOPY  10/26/2011   Dr. Kerby Moors gastric submucosal petechia (bx-benign ulceration), juxta ampullary duodenal diverticulum and some mucosal edema involving 1st/2nd portion of duodenum with superficial erosions (bx-superficial ulceration/benign)   ESOPHAGOGASTRODUODENOSCOPY  02/21/2012   Procedure: ESOPHAGOGASTRODUODENOSCOPY (EGD);  Surgeon: Malissa Hippo, MD;  Location: [facility];  Service: Endoscopy;  Laterality: N/A;   EYE SURGERY     Incomplete colonoscopy  December 2009   Left-sided diverticula, mid descending colon polyp, due to recurrent looping and redundancy exam was incomplete. It was felt that the mid colon was reached. Followup barium enema showed colon interposition between the liver and the diaphragm, redundant sigmoid colon but no colon mass or polyp identified.. Pathology revealed tubular adenoma.   LEFT HEART CATH AND CORONARY ANGIOGRAPHY N/A 01/18/2021   Procedure: LEFT HEART CATH AND CORONARY ANGIOGRAPHY;  Surgeon: Runell Gess, MD;  [facility];  Service: Cardiovascular;  Laterality: N/A;   PACEMAKER IMPLANT N/A 12/22/2021   Procedure: PACEMAKER IMPLANT;  Surgeon: Marinus Maw, MD;  Location: [facility];  Service:  Cardiovascular;  Laterality: N/A;   POLYPECTOMY  09/20/2018   Procedure: POLYPECTOMY;  Surgeon: Malissa Hippo, MD;  [facility];  Service: Endoscopy;;  Hepatic flexure polyp cold snare, splenic flexure polyp   SPHINCTEROTOMY  01/07/2012   Procedure: SPHINCTEROTOMY;  Surgeon: Malissa Hippo, MD;  Location: [facility];  Service: Endoscopy;  Laterality: N/A;  Extended   SPYGLASS CHOLANGIOSCOPY  02/21/2012   Procedure: SPYGLASS CHOLANGIOSCOPY;  Surgeon: Malissa Hippo, MD;  Location: [facility];  Service: Endoscopy;  Laterality: N/A;   SPYGLASS CHOLANGIOSCOPY N/A 05/18/2012   Procedure: SPYGLASS CHOLANGIOSCOPY;  Surgeon: Malissa Hippo, MD;  Location: [facility];  Service: Endoscopy;  Laterality:  N/A;   TEE WITHOUT CARDIOVERSION N/A 03/09/2021   Procedure: TRANSESOPHAGEAL ECHOCARDIOGRAM (TEE);  Surgeon: Corliss Skains, MD;  Location: [facility] OR;  Service: Open Heart Surgery;  Laterality: N/A;   TOTAL KNEE ARTHROPLASTY  03/21/2012   Procedure: TOTAL KNEE ARTHROPLASTY;  Surgeon: [name], MD;  Location: [facility];  Service: Orthopedics;  Laterality: Right;  right knee arthroplasty   TOTAL KNEE ARTHROPLASTY Left 04/08/2019   Procedure: LEFT TOTAL KNEE ARTHROPLASTY;  Surgeon: Gean Birchwood, MD;  Location: [facility];  Service: Orthopedics;  Laterality: Left;       Home Medications    Prior to Admission medications   Medication Sig Start Date End Date Taking? Authorizing Provider  acetaminophen (TYLENOL) 500 MG tablet Take 500 mg by mouth every 6 (six) hours as needed (for pain.).    [provider]  amLODipine (NORVASC) 2.5 MG tablet Take 1 tablet (2.5 mg total) by mouth daily. 12/07/22 03/07/23  Marinus Maw, MD  apixaban (ELIQUIS) 2.5 MG TABS tablet Take 1 tablet (2.5 mg total) by mouth 2 (two) times daily. 11/30/22   Jonelle Sidle, MD  atorvastatin (LIPITOR) 20 MG tablet TAKE ONE (1) TABLET BY MOUTH EVERY DAY 11/09/22   Jonelle Sidle, MD  ELIQUIS 2.5 MG TABS tablet TAKE ONE TABLET (2.5 MG TOTAL) BY MOUTH TWO TIMES DAILY 11/01/22   Jonelle Sidle, MD  ezetimibe (ZETIA) 10 MG tablet Take 10 mg by mouth at bedtime. 11/04/21   [provider]    Family History Family History  Problem Relation Age of Onset   Diabetes Mother    Coronary artery disease Mother    Coronary artery disease Father    Diabetes Father    Stomach cancer Father    Deep vein thrombosis Daughter    Hypotension Neg Hx    Anesthesia problems Neg Hx    Malignant hyperthermia Neg Hx    Pseudochol deficiency Neg Hx    Colon cancer Neg Hx     Social History Social History   Tobacco Use   Smoking status: Never   Smokeless tobacco: Never  Vaping Use   Vaping status: Never Used   Substance Use Topics   Alcohol use: No    Alcohol/week: 0.0 standard drinks of alcohol   Drug use: No     Allergies   Patient has no known allergies.   Review of Systems Review of Systems Per Walter  Physical Exam Triage Vital Signs ED Triage Vitals  Encounter Vitals Group     BP 12/13/22 1856 (!) 152/66     Systolic BP Percentile --      Diastolic BP Percentile --      Pulse Rate 12/13/22 1856 63     Resp 12/13/22 1856 16     Temp 12/13/22 1856 97.8 F (  36.6 C)     Temp src --      SpO2 12/13/22 1856 99 %     Weight --      Height --      Head Circumference --      Peak Flow --      Pain Score 12/13/22 1857 5     Pain Loc --      Pain Education --      Exclude from Growth Chart --    No data found.  Updated Vital Signs BP (!) 152/66 (BP Location: Left Arm)   Pulse 63   Temp 97.8 F (36.6 C)   Resp 16   SpO2 99%   Visual Acuity Right Eye Distance:   Left Eye Distance:   Bilateral Distance:    Right Eye Near:   Left Eye Near:    Bilateral Near:     Physical Exam Vitals and nursing note reviewed.  Constitutional:      General: He is not in acute distress.    Appearance: Normal appearance.  Eyes:     Extraocular Movements: Extraocular movements intact.     Pupils: Pupils are equal, round, and reactive to light.  Pulmonary:     Effort: Pulmonary effort is normal.  Musculoskeletal:     Cervical back: Normal range of motion.     Left ankle: Swelling present. No deformity. Tenderness present over the medial malleolus. Decreased range of motion. Normal pulse.     Left foot: Normal range of motion and normal capillary refill. No swelling or deformity (Deformity of second toe, noted to be at baseline). Normal pulse.  Lymphadenopathy:     Cervical: No cervical adenopathy.  Skin:    General: Skin is warm and dry.  Neurological:     General: No focal deficit present.     Mental Status: He is alert and oriented to person, place, and time.  Psychiatric:         Mood and Affect: Mood normal.        Behavior: Behavior normal.      UC Treatments / Results  Labs (all labs ordered are listed, but only abnormal results are displayed) Labs Reviewed - No data to display  EKG   Radiology No results found.  Procedures Procedures (including critical [name] time)  Medications Ordered in UC Medications - No data to display  Initial Impression / Assessment and Plan / UC Course  I have reviewed the triage vital signs and the nursing notes.  Pertinent labs & imaging results that were available during my [name] of the patient were reviewed by me and considered in my medical decision making (see chart for details).  X-ray of the left foot is pending.  Patient with moderate swelling to the medial malleolus of the left ankle.  Will place patient in a cam boot pending the x-ray result.  Do suspect patient may have a sprain at a minimum.  Supportive [name] recommendations were provided and discussed with the patient to include over-the-counter analgesics such as Tylenol, and RICE therapy.  Patient was advised he will be contacted if the x-ray is abnormal.  Patient was advised if the x-ray is abnormal, he will be instructed to follow-up with orthopedics for further evaluation.  Patient is in agreement with this plan of [name] and verbalizes understanding.  All questions were answered.  Patient stable for discharge.  Final Clinical Impressions(s) / UC Diagnoses   Final diagnoses:  None   Discharge Instructions  None    ED Prescriptions   None    PDMP not reviewed this encounter.   Abran Cantor, NP 12/13/22 1945

## 2022-12-13 NOTE — Discharge Instructions (Addendum)
X-ray of the left foot is pending.  As discussed, you will be contacted only if the x-ray is abnormal.  You will also have access to results via MyChart. A cam boot has been provided to provide additional compression and support.  This device will also help with ambulation. RICE therapy, rest, ice, compression, and elevation.  Apply ice for 20 minutes, remove for 1 hour, repeat as needed.  Do this is much as possible to help with pain and swelling. If you have a fracture in the left foot/ankle, I would like for you to follow-up with orthopedics for further evaluation. Follow-up as needed.

## 2022-12-13 NOTE — ED Triage Notes (Signed)
Pt states he slipped and fell in the shower yesterday injuring his left foot.  Painful weightbearing.

## 2022-12-14 ENCOUNTER — Telehealth: Payer: Self-pay

## 2022-12-14 NOTE — Telephone Encounter (Signed)
Pt called with the results of his foot xray. Pt verbalized understanding of the result.

## 2022-12-15 ENCOUNTER — Ambulatory Visit: Payer: PPO | Admitting: Urology

## 2022-12-15 ENCOUNTER — Encounter: Payer: Self-pay | Admitting: Urology

## 2022-12-15 VITALS — BP 160/68 | HR 60 | Ht 74.0 in | Wt 237.5 lb

## 2022-12-15 DIAGNOSIS — R82998 Other abnormal findings in urine: Secondary | ICD-10-CM

## 2022-12-15 DIAGNOSIS — N390 Urinary tract infection, site not specified: Secondary | ICD-10-CM | POA: Diagnosis not present

## 2022-12-15 DIAGNOSIS — R319 Hematuria, unspecified: Secondary | ICD-10-CM

## 2022-12-15 DIAGNOSIS — Z87440 Personal history of urinary (tract) infections: Secondary | ICD-10-CM | POA: Diagnosis not present

## 2022-12-15 DIAGNOSIS — R300 Dysuria: Secondary | ICD-10-CM | POA: Diagnosis not present

## 2022-12-15 LAB — MICROSCOPIC EXAMINATION

## 2022-12-15 LAB — URINALYSIS, ROUTINE W REFLEX MICROSCOPIC
Bilirubin, UA: NEGATIVE
Glucose, UA: NEGATIVE
Ketones, UA: NEGATIVE
Nitrite, UA: POSITIVE — AB
Specific Gravity, UA: 1.02 (ref 1.005–1.030)
Urobilinogen, Ur: 1 mg/dL (ref 0.2–1.0)
pH, UA: 7 (ref 5.0–7.5)

## 2022-12-15 MED ORDER — SULFAMETHOXAZOLE-TRIMETHOPRIM 800-160 MG PO TABS: 1.0000 | ORAL_TABLET | Freq: Two times a day (BID) | ORAL | 0 refills | Status: DC

## 2022-12-15 NOTE — Progress Notes (Signed)
post void residual= 37 Subjective:  1. Personal history of urinary infection   2. Urinary tract infection with hematuria, site unspecified     01/31/20: [name] returns today in f/u from recent imaging.  He had a CT for the abdominal pain he had at his last visit and was found to have some small but slightly larger left iliac adenopathy.  He then had a PSMA PET that showed mild uptake in the nodes and this is felt to be either indolent nodal metastases vs slightly enlarged benign nodes.  His PSA remains <0.1 in 10/21.  He has continue to have some abdominal discomfort and has seen GI but a cause has not been clearly determined.  He has chronic constipation.    He is having SUI and uses paper towel.  He has urgency with UUI that has progressed.  He has nocturia x 3.   He had previously been on Oxybutynin and he was given samples of Gemtesa which helped a little bit.  He had UDS in the past that showed a bladder with some instability.   1/5/23[name] returns today in f/u.  His PSA prior to this visit remains undetectible.  A repeat CT [facility] on 01/26/21 showed no further adenopathy or other concerning findings.  He is going to have a CABG on 03/09/21 by Dr. Cliffton Asters.  He had been having DOE.  He has persistent MUI.  He didn't get much benefit from oxybutynin or myrbetriq.  He is using a paper towel in the underwear and has moderate nocturia.  He has had no hematuria.  He had a seed implant remotely.    1/4/24[name] returns today in f/u. His PSA remains <0.1.  Chest CT in 7/23 showed no concerning findings.  He has a history of MUI with DI. He had a CABG in 1/23. He remains on Eliquis. He had to have a pacemaker in November.  He has stable LUTS with an IPSS of 20 with nocturia 4x and some intermittency and dribbling. He has some MUI.  He has occ dysuria but no hematuria. He had a DVT in the left arm and the Eliquis dose was increased.  His UA has >30 WBC, 3-10 RBC and moderate bacteria.  He has CKD 3b with a  Cr of 1.73 in 10/23.  2/1/24[name] returns today in f/u following treatment of a UTI.  His culture grew serratia and he was switched from keflex to bactrim.   He has the occasional sharp pain in the penis.  He has persistent frequency and nocturia x 4.  He has some urgency with occasional UUI.  He has no hematuria. His Cr on 03/02/22 was 1.85.  He got a little better with the bactrim but his UA looks infected today.  He has a single stone remotely.    3/28/24[name] returns today in f/u from a CT scan for cystoscopy.  The CT showed no obvious cause of the UTI's or hematuria. There was a punctate renal stone.  He remains on TMP for suppression.  He has 3-10 RBC's on UA today.  His PSA was <0.1 on 03/24/22. He had some stable enlarged lymph nodes and borderline dilation of the pulmonary trunk on the CT as well.   10/24/24[name] returns today in f/u.  He had been on TMP suppression but ran out about a month ago.  He had the onset yesterday of dysuria and it has worsened today.  His UA had 11-30 WBC and 3-10  RBC's with many bacteria.   He has had no fever or chills.  He has increased frequency and urgency.  He can have UUI.  He has a variable stream.   HIs IPSS is 28 with nocturia x 4.       IPSS     Row Name 12/15/22 1300         International Prostate Symptom Score   How often have you had the sensation of not emptying your bladder? About half the time     How often have you had to urinate less than every two hours? Almost always     How often have you found you stopped and started again several times when you urinated? More than half the time     How often have you found it difficult to postpone urination? More than half the time     How often have you had a weak urinary stream? More than half the time     How often have you had to strain to start urination? More than half the time     How many times did you typically get up at night to urinate? 4 Times     Total IPSS Score 28       Quality of  Life due to urinary symptoms   If you were to spend the rest of your life with your urinary condition just the way it is now how would you feel about that? Mostly Disatisfied                ROS:  ROS:  A complete review of systems was performed.  All systems are negative except for pertinent findings as noted.   Review of Systems  Constitutional:  Negative for chills and fever.  Genitourinary:  Positive for frequency and urgency.    No Known Allergies  Outpatient Encounter Medications as of 12/15/2022  Medication Sig   acetaminophen (TYLENOL) 500 MG tablet Take 500 mg by mouth every 6 (six) hours as needed (for pain.).   amLODipine (NORVASC) 2.5 MG tablet Take 1 tablet (2.5 mg total) by mouth daily.   apixaban (ELIQUIS) 2.5 MG TABS tablet Take 1 tablet (2.5 mg total) by mouth 2 (two) times daily.   atorvastatin (LIPITOR) 20 MG tablet TAKE ONE (1) TABLET BY MOUTH EVERY DAY   ELIQUIS 2.5 MG TABS tablet TAKE ONE TABLET (2.5 MG TOTAL) BY MOUTH TWO TIMES DAILY   ezetimibe (ZETIA) 10 MG tablet Take 10 mg by mouth at bedtime.   sulfamethoxazole-trimethoprim (BACTRIM DS) 800-160 MG tablet Take 1 tablet by mouth every 12 (twelve) hours.   No facility-administered encounter medications on file as of 12/15/2022.    Past Medical History:  Diagnosis Date   Arthritis    CAD (coronary artery disease)    Two-vessel obstructive disease November 2022; CABG with LIMA to LAD and SVG to PDA January 2023   Chronic back pain    CKD (chronic kidney disease) stage 3, GFR 30-59 ml/min (HCC)    Essential hypertension    GERD (gastroesophageal reflux disease)    Helicobacter pylori gastritis    Treated   History of kidney stones    Hx of adenomatous colonic polyps    IBS (irritable bowel syndrome)    PAF (paroxysmal atrial fibrillation) (HCC)    Prostate CA (HCC)    Seed implants   Schatzki's ring    Stroke [facility])     Past Surgical History:  Procedure Laterality Date   APPENDECTOMY  age  23   BACK SURGERY  02/08   [facility]   BACK SURGERY  09/24/04   lumbar, [facility]   BACK SURGERY  09/18/02   [facility]   BILIARY STENT PLACEMENT  01/07/2012   Procedure: BILIARY STENT PLACEMENT;  Surgeon: Malissa Hippo, MD;  Location: [facility] ORS;  Service: Endoscopy;  Laterality: N/A;   BILIARY STENT PLACEMENT  02/21/2012   Procedure: BILIARY STENT PLACEMENT;  Surgeon: Malissa Hippo, MD;  Location: [facility];  Service: Endoscopy;  Laterality: N/A;  Biliary Stent Replacement   CATARACT EXTRACTION W/PHACO  11/22/2010   Procedure: CATARACT EXTRACTION PHACO AND INTRAOCULAR LENS PLACEMENT (IOC);  Surgeon: Susa Simmonds;  Location: [facility];  Service: Ophthalmology;  Laterality: Right;  CDE: 6.81   CHOLECYSTECTOMY  03/04/05   [facility], [facility]. Gangrenous cholecystitis complicated by abscess requiring percutaneous drainage. He also had common duct stones requiring ERCP and sphincterotomy.   COLONOSCOPY  August 2005   Scattered sigmoid diverticulosis, splenic flexure polyp. Hyperplastic   COLONOSCOPY  1997   3 cm tubular adenoma and a sending colon   COLONOSCOPY  02/01/2011   Rourk-friable anal canal, hyperplastic rectal polyp   COLONOSCOPY N/A 08/15/2013   Procedure: COLONOSCOPY;  Surgeon: Malissa Hippo, MD;  Location: [facility] END[facility];  Service: Endoscopy;  Laterality: N/A;  100   COLONOSCOPY N/A 09/20/2018   Procedure: COLONOSCOPY;  Surgeon: Malissa Hippo, MD;  Location: [facility];  Service: Endoscopy;  Laterality: N/A;  12:00pm   CORONARY ARTERY BYPASS GRAFT N/A 03/09/2021   Procedure: OFF PUMP CORONARY ARTERY BYPASS GRAFTING (CABG) X2, USING LEFT INTERNAL MAMMARY ARTERY AND RIGHT LEG GREATER SAPHENOUS VEIN HARVESTED ENDOSCOPICALLY;  Surgeon: Corliss Skains, MD;  Location: [facility] OR;  Service: Open Heart Surgery;  Laterality: N/A;  X 2   ERCP  03/02/05   [facility], R[facility]k. Normal-appearing biliary tree (gallbladder not image), status post sphincterotomy with recovery of small pieces of stone material, status post  balloon occlusion cholangiogram.   ERCP  01/07/2012   Procedure: ENDOSCOPIC RETROGRADE CHOLANGIOPANCREATOGRAPHY (ERCP);  Surgeon: Malissa Hippo, MD;  Location: [facility];  Service: Endoscopy;  Laterality: N/A;  possible biliary stenting   ERCP  02/21/2012   Procedure: ENDOSCOPIC RETROGRADE CHOLANGIOPANCREATOGRAPHY (ERCP);  Surgeon: Malissa Hippo, MD;  Location: [facility];  Service: Endoscopy;  Laterality: N/A;  common bile duct stone and clip removed   ERCP N/A 05/18/2012   Procedure: ENDOSCOPIC RETROGRADE CHOLANGIOPANCREATOGRAPHY (ERCP);  Surgeon: Malissa Hippo, MD;  Location: [facility];  Service: Endoscopy;  Laterality: N/A;  stone and debri removal   ESOPHAGOGASTRODUODENOSCOPY  August 2005   Erosive esophagitis and Schatzki ring, small hiatal hernia   ESOPHAGOGASTRODUODENOSCOPY  01/2008   Mild reflux esophagitis, small hiatal hernia   ESOPHAGOGASTRODUODENOSCOPY  02/01/2011   Rourk-erosive reflux esophagitis, small hiatal hernia   ESOPHAGOGASTRODUODENOSCOPY  10/26/2011   Dr. Kerby Moors gastric submucosal petechia (bx-benign ulceration), juxta ampullary duodenal diverticulum and some mucosal edema involving 1st/2nd portion of duodenum with superficial erosions (bx-superficial ulceration/benign)   ESOPHAGOGASTRODUODENOSCOPY  02/21/2012   Procedure: ESOPHAGOGASTRODUODENOSCOPY (EGD);  Surgeon: Malissa Hippo, MD;  Location: [facility];  Service: Endoscopy;  Laterality: N/A;   EYE SURGERY     Incomplete colonoscopy  December 2009   Left-sided diverticula, mid descending colon polyp, due to recurrent looping and redundancy exam was incomplete. It was felt that the mid colon was reached. Followup barium enema showed colon interposition between the liver and the diaphragm, redundant sigmoid colon but no colon mass or  polyp identified.. Pathology revealed tubular adenoma.   LEFT HEART CATH AND CORONARY ANGIOGRAPHY N/A 01/18/2021   Procedure: LEFT HEART CATH AND CORONARY ANGIOGRAPHY;  Surgeon: Runell Gess, MD;  [facility] INVAS[facility] LAB;  Service: Cardiovascular;  Laterality: N/A;   PACEMAKER IMPLANT N/A 12/22/2021   Procedure: PACEMAKER IMPLANT;  Surgeon: Marinus Maw, MD;  Location: [facility] CV LAB;  Service: Cardiovascular;  Laterality: N/A;   POLYPECTOMY  09/20/2018   Procedure: POLYPECTOMY;  Surgeon: Malissa Hippo, MD;  [facility];  Service: Endoscopy;;  Hepatic flexure polyp cold snare, splenic flexure polyp   SPHINCTEROTOMY  01/07/2012   Procedure: SPHINCTEROTOMY;  Surgeon: Malissa Hippo, MD;  Location: [facility];  Service: Endoscopy;  Laterality: N/A;  Extended   SPYGLASS CHOLANGIOSCOPY  02/21/2012   Procedure: SPYGLASS CHOLANGIOSCOPY;  Surgeon: Malissa Hippo, MD;  Location: [facility];  Service: Endoscopy;  Laterality: N/A;   SPYGLASS CHOLANGIOSCOPY N/A 05/18/2012   Procedure: SPYGLASS CHOLANGIOSCOPY;  Surgeon: Malissa Hippo, MD;  Location: [facility];  Service: Endoscopy;  Laterality: N/A;   TEE WITHOUT CARDIOVERSION N/A 03/09/2021   Procedure: TRANSESOPHAGEAL ECHOCARDIOGRAM (TEE);  Surgeon: Corliss Skains, MD;  [facility]: [facility] OR;  Service: Open Heart Surgery;  Laterality: N/A;   TOTAL KNEE ARTHROPLASTY  03/21/2012   Procedure: TOTAL KNEE ARTHROPLASTY;  Surgeon: [name], MD;  Location: [facility] OR;  Service: Orthopedics;  Laterality: Right;  right knee arthroplasty   TOTAL KNEE ARTHROPLASTY Left 04/08/2019   Procedure: LEFT TOTAL KNEE ARTHROPLASTY;  Surgeon: Gean Birchwood, MD;  Location: [facility] ORS;  Service: Orthopedics;  Laterality: Left;    Social History   Socioeconomic History   Marital status: Married    Spouse name: Not on file   Number of children: 2   Years of education: Not on file   Highest education level: Not on file  Occupational History   Occupation: Retired Careers information officer: RETIRED  Tobacco Use   Smoking status: Never   Smokeless tobacco: Never  Vaping Use   Vaping status: Never Used  Substance and Sexual Activity   Alcohol  use: No    Alcohol/week: 0.0 standard drinks of alcohol   Drug use: No   Sexual activity: Not on file  Other Topics Concern   Not on file  Social History Narrative   Not on file   Social Determinants of Health   Financial Resource Strain: Not on file  Food Insecurity: Not on file  Transportation Needs: Not on file  Physical Activity: Not on file  Stress: Not on file  Social Connections: Unknown (10/12/2022)   Received from [facility]   Social Network    Social Network: Not on file  Intimate Partner Violence: Unknown (10/12/2022)   Received from [facility]   HITS    Physically Hurt: Not on file    Insult or Talk Down To: Not on file    Threaten Physical Harm: Not on file    Scream or Curse: Not on file    Family History  Problem Relation Age of Onset   Diabetes Mother    Coronary artery disease Mother    Coronary artery disease Father    Diabetes Father    Stomach cancer Father    Deep vein thrombosis Daughter    Hypotension Neg Hx    Anesthesia problems Neg Hx    Malignant hyperthermia Neg Hx    Pseudochol deficiency Neg Hx    Colon cancer Neg  Hx        Objective: Vitals:   12/15/22 1325  BP: (!) 160/68  Pulse: 60     Physical Exam Vitals reviewed.  Constitutional:      Appearance: Normal appearance.  Neurological:     Mental Status: He is alert.     Lab Results:  Results for orders placed or performed in visit on 12/15/22 (from the past 24 hour(s))  Urinalysis, Routine w reflex microscopic     Status: Abnormal   Collection Time: 12/15/22  1:29 PM  Result Value Ref Range   Specific Gravity, UA 1.020 1.005 - 1.030   pH, UA 7.0 5.0 - 7.5   Color, UA Yellow Yellow   Appearance Ur Cloudy (A) Clear   Leukocytes,UA 2+ (A) Negative   Protein,UA Trace Negative/Trace   Glucose, UA Negative Negative   Ketones, UA Negative Negative   RBC, UA Trace (A) Negative   Bilirubin, UA Negative Negative   Urobilinogen, Ur 1.0 0.2 - 1.0 mg/dL   Nitrite,  UA Positive (A) Negative   Microscopic Examination See below:    Narrative   Performed at:  [address] - [facility] [address] [place], [place]  [id number] Lab Director: Chinita Pester MT, Phone:  [phone]  Microscopic Examination     Status: Abnormal   Collection Time: 12/15/22  1:29 PM   Urine  Result Value Ref Range   WBC, UA 11-30 (A) 0 - 5 /hpf   RBC, Urine 3-10 (A) 0 - 2 /hpf   Epithelial Cells (non renal) 0-10 0 - 10 /hpf   Bacteria, UA Many (A) None seen/Few   Narrative   Performed at:  [address] - [facility] [address] [place], [place]  [id number] Lab Director: Chinita Pester MT, Phone:  [phone]       BMET No results for input(s): "NA", "K", "CL", "CO2", "GLUCOSE", "BUN", "CREATININE", "CALCIUM" in the last 72 hours. PSA <0.1 in 9/20. <0.1 in 10/21 <0.1 in 12/22 <0.1 in 12/23 UA reviewed.  He appears to have a UTI.    Studies/Results: DG Foot Complete Left  Result Date: 12/13/2022 CLINICAL DATA:  Fall with pain, unable to put weight on left foot. EXAM: LEFT FOOT - COMPLETE 3+ VIEW COMPARISON:  None Available. FINDINGS: A tiny bony density is present along the dorsal aspect of the middle phalanx of the second digit. Hammertoe deformity is noted at the second digit with hyperflexion of the proximal interphalangeal joint. There is hallux valgus deformity at the first metatarsophalangeal joint with degenerative changes. There is midfoot collapse with degenerative changes. A large calcaneal spur is noted. Soft tissue swelling is present about the ankle and over the dorsum of the foot. IMPRESSION: 1. Bony density along the dorsal aspect of the base of the middle phalanx of the second digit, possible avulsion fracture of indeterminate age. Examination of the second digit is limited due to hammertoe deformity. 2. Hallux valgus deformity and degenerative changes at the first metatarsophalangeal joint. 3. Scattered degenerative changes in the midfoot and  hindfoot. Electronically Signed   By: Thornell Sartorius M.D.   On: 12/13/2022 21:46   MR CERVICAL SPINE WO CONTRAST  Result Date: 12/05/2022 CLINICAL DATA:  Chronic neck pain.  Previous thoracic fusion. EXAM: MRI CERVICAL SPINE WITHOUT CONTRAST TECHNIQUE: Multiplanar, multisequence MR imaging of the cervical spine was performed. No intravenous contrast was administered. COMPARISON:  Radiography 08/05/2022 FINDINGS: Alignment: Mild cervicothoracic curvature convex to the left. Chronic fixed anterolisthesis at T2-3 of about 6-7  mm with kyphotic deformity. There is bone fusion across this and no change or dynamic process would be expected at this level. Vertebrae: No bone abnormality seen in the cervical region. Cord: No cord compression or focal cord lesion. Narrowing of the ventral subarachnoid space in the upper thoracic region because of the kyphotic deformity, but ample subarachnoid spaces present dorsal to the cord. Posterior Fossa, vertebral arteries, paraspinal tissues: Negative Disc levels: The foramen magnum is widely patent. There is ordinary mild osteoarthritis of the C1-2 articulation but no encroachment upon the neural structures. C2-3: Facet osteoarthritis left worse than right. Mild left foraminal narrowing. C3-4: Endplate osteophytes and bulging of the disc. Facet osteoarthritis left worse than right. No compressive canal stenosis. Bilateral foraminal narrowing right worse than left. C4-5: Endplate osteophytes and shallow protrusion of the disc. Slight caudal migration of disc material in the midline. Narrowing of the ventral subarachnoid space but no compressive effect upon the cord. Bilateral foraminal narrowing that could affect either C5 nerve. C5-6: Endplate osteophytes and shallow protrusion of the disc more prominent towards the left. Narrowing of the ventral subarachnoid space but no compression of the cord. Bilateral foraminal stenosis, left worse than right. C6-7: Endplate osteophytes and  broad-based disc herniation with caudal migration of disc material. Effacement of the subarachnoid space and slight indentation of the cord to the right of midline, but no frank cord compression or abnormal cord signal. Bilateral foraminal narrowing that could affect either C7 nerve. C7-T1: Mild bulging of the disc.  No canal or foraminal stenosis. T1-2: Shallow disc protrusion indents the ventral subarachnoid space but does not compress the cord. Bilateral facet osteoarthritis at this level. Bilateral foraminal narrowing, right worse than left. The right T1 nerve could be affected. IMPRESSION: 1. Chronic kyphotic deformity at the T2-3 level with fixed anterolisthesis of about 6-7 mm. There is bone fusion across this level. 2. Degenerative spondylosis and facet arthropathy at C2-3 and C3-4 with bilateral foraminal narrowing that could possibly affect the exiting nerves. 3. C4-5: Spondylosis with disc protrusion and slight caudal migration of disc material in the midline. Narrowing of the ventral subarachnoid space but no compressive effect upon the cord. Bilateral foraminal stenosis that could affect either C5 nerve. 4. C5-6: Spondylosis with shallow protrusion of the disc more prominent towards the left. Narrowing of the ventral subarachnoid space but no compression of the cord. Bilateral foraminal stenosis left worse than right could affect either C6 nerve. 5. C6-7: Spondylosis with broad-based disc herniation with caudal migration of disc material. Effacement of the subarachnoid space and slight indentation of the cord to the right of midline, but no frank cord compression or abnormal cord signal. Bilateral foraminal stenosis that could affect either C7 nerve. 6. T1-2: Shallow disc protrusion. Facet osteoarthritis. Bilateral foraminal narrowing, right worse than left. The right T1 nerve could be affected. Electronically Signed   By: Paulina Fusi M.D.   On: 12/05/2022 11:23   CUP PACEART REMOTE DEVICE  CHECK  Result Date: 09/26/2022 Scheduled remote reviewed. Normal device function.  9 AMS totaling <1% of the time, EGMs consistent with AT.  Longest lasted 8 minutes. Next remote 91 days. - CS, CVRS   PVR 37ml  Assessment & Plan: UTI with microhematuria.  I will get a culture and start him on bactrim ds for a week.  Depending on the culture, we may need to resume suppresion.   Membranous stricture from radiation.  He has a variable stream. He will keep his f/u in November.  History of prostate cancer.  Continue PSA f/u.   History of stones.  He had only a punctate stone on his last CT.   Meds ordered this encounter  Medications   sulfamethoxazole-trimethoprim (BACTRIM DS) 800-160 MG tablet    Sig: Take 1 tablet by mouth every 12 (twelve) hours.    Dispense:  14 tablet    Refill:  0     Orders Placed This Encounter  Procedures   Urine Culture   Microscopic Examination   Urinalysis, Routine w reflex microscopic      Return for keep scheduled f/u. Marland Kitchen   CC: Benita Stabile, MD      Bjorn Pippin 12/16/2022 Patient ID: [name], male   DOB: 1941-02-07, 82 y.o.   MRN: [id number]

## 2022-12-20 LAB — URINE CULTURE

## 2022-12-22 ENCOUNTER — Telehealth: Payer: Self-pay

## 2022-12-22 NOTE — Telephone Encounter (Addendum)
Patient reports of several near syncope events while out in the yard raking leaves. Patient states he experiences dizziness with position change and ambulation. Pt does not check his BP at home. Compliant with Norvasc 2.5 mg daily. Remote transmission received and presenting rhythm appears SR w/ frequent PVC's (not a new finding) PVC burden is 4.2%. Spoke to [place] with [facility] who recommends lowering HVR detervtion rate from 175 bpm to 150 mg to see if VT is present >20 beats. Patient called made aware of findings. DC apt made tomorrow 12/23/22 @ 8:45 in the [place] office. Pt aware of location. Patient advised per Dr. Ladona Ridgel to stay well hydrated. Pt voiced understanding.

## 2022-12-22 NOTE — Telephone Encounter (Signed)
The pt states he passed out about 3 times within 3 weeks. He been feeling faint at times. I had the patient send a manual transmission with his home remote monitor. He states he has been to the hospital but wants to know if anything showing on his pacemaker. Transmission received 12/22/2022.

## 2022-12-22 NOTE — Telephone Encounter (Signed)
Patient made aware to complete the Bactrim for treatment of UTI. Patient voiced understanding.

## 2022-12-22 NOTE — Telephone Encounter (Signed)
-----   Message from Bjorn Pippin sent at 12/21/2022  4:57 PM EDT ----- Complete bactrim. ----- Message ----- From: Grier Rocher, CMA Sent: 12/20/2022  10:20 AM EDT To: Bjorn Pippin, MD  Pt started on Bactrim

## 2022-12-22 NOTE — Telephone Encounter (Signed)
Patient left a voice message for a return call.

## 2022-12-23 ENCOUNTER — Ambulatory Visit: Payer: PPO | Attending: Internal Medicine

## 2022-12-23 ENCOUNTER — Other Ambulatory Visit: Payer: Self-pay | Admitting: *Deleted

## 2022-12-23 DIAGNOSIS — I452 Bifascicular block: Secondary | ICD-10-CM

## 2022-12-23 LAB — CUP PACEART INCLINIC DEVICE CHECK
Date Time Interrogation Session: 20241101090802
Implantable Lead Connection Status: 753985
Implantable Lead Connection Status: 753985
Implantable Lead Implant Date: 20231101
Implantable Lead Implant Date: 20231101
Implantable Lead Location: 753859
Implantable Lead Location: 753860
Implantable Pulse Generator Implant Date: 20231101
Pulse Gen Model: 2272
Pulse Gen Serial Number: 8115159

## 2022-12-23 MED ORDER — APIXABAN 2.5 MG PO TABS: 2.5000 mg | ORAL_TABLET | Freq: Two times a day (BID) | ORAL | 0 refills | Status: DC

## 2022-12-23 NOTE — Progress Notes (Signed)
Patient seen in device clinic to decrease HVR detection rate from 175 bpm to 150 bpm per Dr. Ladona Ridgel. See EPIC note 12/22/22.

## 2022-12-26 ENCOUNTER — Ambulatory Visit (INDEPENDENT_AMBULATORY_CARE_PROVIDER_SITE_OTHER): Payer: PPO

## 2022-12-26 DIAGNOSIS — I495 Sick sinus syndrome: Secondary | ICD-10-CM

## 2022-12-26 LAB — CUP PACEART REMOTE DEVICE CHECK
Battery Remaining Longevity: 113 mo
Battery Remaining Percentage: 92 %
Battery Voltage: 3.01 V
Brady Statistic AP VP Percent: 1.5 %
Brady Statistic AP VS Percent: 61 %
Brady Statistic AS VP Percent: 1.7 %
Brady Statistic AS VS Percent: 28 %
Brady Statistic RA Percent Paced: 51 %
Brady Statistic RV Percent Paced: 3.3 %
Date Time Interrogation Session: 20241104010014
Implantable Lead Connection Status: 753985
Implantable Lead Connection Status: 753985
Implantable Lead Implant Date: 20231101
Implantable Lead Implant Date: 20231101
Implantable Lead Location: 753859
Implantable Lead Location: 753860
Implantable Pulse Generator Implant Date: 20231101
Lead Channel Impedance Value: 400 Ohm
Lead Channel Impedance Value: 460 Ohm
Lead Channel Pacing Threshold Amplitude: 0.375 V
Lead Channel Pacing Threshold Amplitude: 0.75 V
Lead Channel Pacing Threshold Pulse Width: 0.5 ms
Lead Channel Pacing Threshold Pulse Width: 0.5 ms
Lead Channel Sensing Intrinsic Amplitude: 12 mV
Lead Channel Sensing Intrinsic Amplitude: 5 mV
Lead Channel Setting Pacing Amplitude: 1 V
Lead Channel Setting Pacing Amplitude: 1.375
Lead Channel Setting Pacing Pulse Width: 0.5 ms
Lead Channel Setting Sensing Sensitivity: 2 mV
Pulse Gen Model: 2272
Pulse Gen Serial Number: 8115159

## 2022-12-29 ENCOUNTER — Telehealth: Payer: Self-pay | Admitting: *Deleted

## 2022-12-29 ENCOUNTER — Ambulatory Visit: Payer: PPO | Admitting: Urology

## 2022-12-29 ENCOUNTER — Encounter: Payer: Self-pay | Admitting: Urology

## 2022-12-29 VITALS — BP 168/62 | HR 60

## 2022-12-29 DIAGNOSIS — C61 Malignant neoplasm of prostate: Secondary | ICD-10-CM | POA: Diagnosis not present

## 2022-12-29 DIAGNOSIS — Z8546 Personal history of malignant neoplasm of prostate: Secondary | ICD-10-CM | POA: Diagnosis not present

## 2022-12-29 DIAGNOSIS — Z87440 Personal history of urinary (tract) infections: Secondary | ICD-10-CM

## 2022-12-29 DIAGNOSIS — N99112 Postprocedural membranous urethral stricture: Secondary | ICD-10-CM | POA: Diagnosis not present

## 2022-12-29 DIAGNOSIS — N3020 Other chronic cystitis without hematuria: Secondary | ICD-10-CM

## 2022-12-29 DIAGNOSIS — R351 Nocturia: Secondary | ICD-10-CM

## 2022-12-29 LAB — URINALYSIS, ROUTINE W REFLEX MICROSCOPIC
Bilirubin, UA: NEGATIVE
Glucose, UA: NEGATIVE
Ketones, UA: NEGATIVE
Leukocytes,UA: NEGATIVE
Nitrite, UA: NEGATIVE
Protein,UA: NEGATIVE
RBC, UA: NEGATIVE
Specific Gravity, UA: 1.025 (ref 1.005–1.030)
Urobilinogen, Ur: 1 mg/dL (ref 0.2–1.0)
pH, UA: 6 (ref 5.0–7.5)

## 2022-12-29 MED ORDER — APIXABAN 2.5 MG PO TABS: 2.5000 mg | ORAL_TABLET | Freq: Two times a day (BID) | ORAL | 0 refills | Status: DC

## 2022-12-29 MED ORDER — TRIMETHOPRIM 100 MG PO TABS: 100.0000 mg | ORAL_TABLET | Freq: Every day | ORAL | 11 refills | Status: DC

## 2022-12-29 NOTE — Telephone Encounter (Signed)
Samples placed at front desk for pt. Pt notified

## 2022-12-29 NOTE — Telephone Encounter (Signed)
Patient informed and verbalized understanding. Says he does not want to start warfarin at this time. Will send to [facility] office for eliquis sample request.

## 2022-12-29 NOTE — Progress Notes (Signed)
Subjective:  1. Chronic cystitis   2. Postprocedural membranous urethral stricture   3. Nocturia   4. History of prostate cancer     01/31/20: [name] returns today in f/u from recent imaging.  He had a CT for the abdominal pain he had at his last visit and was found to have some small but slightly larger left iliac adenopathy.  He then had a PSMA PET that showed mild uptake in the nodes and this is felt to be either indolent nodal metastases vs slightly enlarged benign nodes.  His PSA remains <0.1 in 10/21.  He has continue to have some abdominal discomfort and has seen GI but a cause has not been clearly determined.  He has chronic constipation.    He is having SUI and uses paper towel.  He has urgency with UUI that has progressed.  He has nocturia x 3.   He had previously been on Oxybutynin and he was given samples of Gemtesa which helped a little bit.  He had UDS in the past that showed a bladder with some instability.   1/5/23[name] returns today in f/u.  His PSA prior to this visit remains undetectible.  A repeat CT [facility] on 01/26/21 showed no further adenopathy or other concerning findings.  He is going to have a CABG on 03/09/21 by Dr. Cliffton Asters.  He had been having DOE.  He has persistent MUI.  He didn't get much benefit from oxybutynin or myrbetriq.  He is using a paper towel in the underwear and has moderate nocturia.  He has had no hematuria.  He had a seed implant remotely.    1/4/24[name] returns today in f/u. His PSA remains <0.1.  Chest CT in 7/23 showed no concerning findings.  He has a history of MUI with DI. He had a CABG in 1/23. He remains on Eliquis. He had to have a pacemaker in November.  He has stable LUTS with an IPSS of 20 with nocturia 4x and some intermittency and dribbling. He has some MUI.  He has occ dysuria but no hematuria. He had a DVT in the left arm and the Eliquis dose was increased.  His UA has >30 WBC, 3-10 RBC and moderate bacteria.  He has CKD 3b with a Cr of  1.73 in 10/23.  2/1/24[name] returns today in f/u following treatment of a UTI.  His culture grew serratia and he was switched from keflex to bactrim.   He has the occasional sharp pain in the penis.  He has persistent frequency and nocturia x 4.  He has some urgency with occasional UUI.  He has no hematuria. His Cr on 03/02/22 was 1.85.  He got a little better with the bactrim but his UA looks infected today.  He has a single stone remotely.    3/28/24[name] returns today in f/u from a CT scan for cystoscopy.  The CT showed no obvious cause of the UTI's or hematuria. There was a punctate renal stone.  He remains on TMP for suppression.  He has 3-10 RBC's on UA today.  His PSA was <0.1 on 03/24/22. He had some stable enlarged lymph nodes and borderline dilation of the pulmonary trunk on the CT as well.   10/24/24[name] returns today in f/u.  He had been on TMP suppression but ran out about a month ago.  He had the onset yesterday of dysuria and it has worsened today.  His UA had 11-30 WBC and  3-10 RBC's with many bacteria.   He has had no fever or chills.  He has increased frequency and urgency.  He can have UUI.  He has a variable stream.   HIs IPSS is 28 with nocturia x 4.   12/29/22: [name] returns today in f/u.   He has been off of the bactrim for a week and his UA is clear.  His symptoms are somewhat better.  He still has nocturia up to 4x.            ROS:  ROS:  A complete review of systems was performed.  All systems are negative except for pertinent findings as noted.   Review of Systems  Constitutional:  Negative for chills and fever.  Genitourinary:  Positive for frequency and urgency.    No Known Allergies  Outpatient Encounter Medications as of 12/29/2022  Medication Sig   trimethoprim (TRIMPEX) 100 MG tablet Take 1 tablet (100 mg total) by mouth at bedtime.   acetaminophen (TYLENOL) 500 MG tablet Take 500 mg by mouth every 6 (six) hours as needed (for pain.).   amLODipine (NORVASC)  2.5 MG tablet Take 1 tablet (2.5 mg total) by mouth daily.   apixaban (ELIQUIS) 2.5 MG TABS tablet Take 1 tablet (2.5 mg total) by mouth 2 (two) times daily.   atorvastatin (LIPITOR) 20 MG tablet TAKE ONE (1) TABLET BY MOUTH EVERY DAY   ezetimibe (ZETIA) 10 MG tablet Take 10 mg by mouth at bedtime.   [DISCONTINUED] ELIQUIS 2.5 MG TABS tablet TAKE ONE TABLET (2.5 MG TOTAL) BY MOUTH TWO TIMES DAILY   [DISCONTINUED] sulfamethoxazole-trimethoprim (BACTRIM DS) 800-160 MG tablet Take 1 tablet by mouth every 12 (twelve) hours.   No facility-administered encounter medications on file as of 12/29/2022.    Past Medical History:  Diagnosis Date   Arthritis    CAD (coronary artery disease)    Two-vessel obstructive disease November 2022; CABG with LIMA to LAD and SVG to PDA January 2023   Chronic back pain    CKD (chronic kidney disease) stage 3, GFR 30-59 ml/min (HCC)    Essential hypertension    GERD (gastroesophageal reflux disease)    Helicobacter pylori gastritis    Treated   History of kidney stones    Hx of adenomatous colonic polyps    IBS (irritable bowel syndrome)    PAF (paroxysmal atrial fibrillation) (HCC)    Prostate CA (HCC)    Seed implants   Schatzki's ring    Stroke [facility])     Past Surgical History:  Procedure Laterality Date   APPENDECTOMY  age 44   BACK SURGERY  02/08   [facility]   BACK SURGERY  09/24/04   lumbar, [facility]   BACK SURGERY  09/18/02   [facility]   BILIARY STENT PLACEMENT  01/07/2012   Procedure: BILIARY STENT PLACEMENT;  Surgeon: Malissa Hippo, MD;  Location: [facility] ORS;  Service: Endoscopy;  Laterality: N/A;   BILIARY STENT PLACEMENT  02/21/2012   Procedure: BILIARY STENT PLACEMENT;  Surgeon: Malissa Hippo, MD;  Location: [facility] ORS;  Service: Endoscopy;  Laterality: N/A;  Biliary Stent Replacement   CATARACT EXTRACTION W/PHACO  11/22/2010   Procedure: CATARACT EXTRACTION PHACO AND INTRAOCULAR LENS PLACEMENT (IOC);  Surgeon: Susa Simmonds;  Location: [facility] ORS;  Service:  Ophthalmology;  Laterality: Right;  CDE: 6.81   CHOLECYSTECTOMY  03/04/05   [facility], [place]. Gangrenous cholecystitis complicated by abscess requiring percutaneous drainage. He also had common duct stones requiring ERCP and sphincterotomy.  COLONOSCOPY  August 2005   Scattered sigmoid diverticulosis, splenic flexure polyp. Hyperplastic   COLONOSCOPY  1997   3 cm tubular adenoma and a sending colon   COLONOSCOPY  02/01/2011   Rourk-friable anal canal, hyperplastic rectal polyp   COLONOSCOPY N/A 08/15/2013   Procedure: COLONOSCOPY;  Surgeon: Malissa Hippo, MD;  Location: [facility] END[facility] SU[facility];  Service: Endoscopy;  Laterality: N/A;  100   COLONOSCOPY N/A 09/20/2018   Procedure: COLONOSCOPY;  Surgeon: Malissa Hippo, MD;  Location: [facility];  Service: Endoscopy;  Laterality: N/A;  12:00pm   CORONARY ARTERY BYPASS GRAFT N/A 03/09/2021   Procedure: OFF PUMP CORONARY ARTERY BYPASS GRAFTING (CABG) X2, USING LEFT INTERNAL MAMMARY ARTERY AND RIGHT LEG GREATER SAPHENOUS VEIN HARVESTED ENDOSCOPICALLY;  Surgeon: Corliss Skains, MD;  Location: [facility] OR;  Service: Open Heart Surgery;  Laterality: N/A;  X 2   ERCP  03/02/05   [facility], [facility]. Normal-appearing biliary tree (gallbladder not image), status post sphincterotomy with recovery of small pieces of stone material, status post balloon occlusion cholangiogram.   ERCP  01/07/2012   Procedure: ENDOSCOPIC RETROGRADE CHOLANGIOPANCREATOGRAPHY (ERCP);  Surgeon: Malissa Hippo, MD;  Location: [facility] OR[facility];  Service: Endoscopy;  Laterality: N/A;  possible biliary stenting   ERCP  02/21/2012   Procedure: ENDOSCOPIC RETROGRADE CHOLANGIOPANCREATOGRAPHY (ERCP);  Surgeon: Malissa Hippo, MD;  Location: [facility];  Service: Endoscopy;  Laterality: N/A;  common bile duct stone and clip removed   ERCP N/A 05/18/2012   Procedure: ENDOSCOPIC RETROGRADE CHOLANGIOPANCREATOGRAPHY (ERCP);  Surgeon: Malissa Hippo, MD;  Location: [facility];  Service: Endoscopy;  Laterality: N/A;  stone and  debri removal   ESOPHAGOGASTRODUODENOSCOPY  August 2005   Erosive esophagitis and Schatzki ring, small hiatal hernia   ESOPHAGOGASTRODUODENOSCOPY  01/2008   Mild reflux esophagitis, small hiatal hernia   ESOPHAGOGASTRODUODENOSCOPY  02/01/2011   Rourk-erosive reflux esophagitis, small hiatal hernia   ESOPHAGOGASTRODUODENOSCOPY  10/26/2011   Dr. Kerby Moors gastric submucosal petechia (bx-benign ulceration), juxta ampullary duodenal diverticulum and some mucosal edema involving 1st/2nd portion of duodenum with superficial erosions (bx-superficial ulceration/benign)   ESOPHAGOGASTRODUODENOSCOPY  02/21/2012   Procedure: ESOPHAGOGASTRODUODENOSCOPY (EGD);  Surgeon: Malissa Hippo, MD;  Location: [facility];  Service: Endoscopy;  Laterality: N/A;   EYE SURGERY     Incomplete colonoscopy  December 2009   Left-sided diverticula, mid descending colon polyp, due to recurrent looping and redundancy exam was incomplete. It was felt that the mid colon was reached. Followup barium enema showed colon interposition between the liver and the diaphragm, redundant sigmoid colon but no colon mass or polyp identified.. Pathology revealed tubular adenoma.   LEFT HEART CATH AND CORONARY ANGIOGRAPHY N/A 01/18/2021   Procedure: LEFT HEART CATH AND CORONARY ANGIOGRAPHY;  Surgeon: Runell Gess, MD;  [facility]: [facility] INVAS[facility] CV LAB;  Service: Cardiovascular;  Laterality: N/A;   PACEMAKER IMPLANT N/A 12/22/2021   Procedure: PACEMAKER IMPLANT;  Surgeon: Marinus Maw, MD;  Location: [facility] INV[facility] CV LAB;  Service: Cardiovascular;  Laterality: N/A;   POLYPECTOMY  09/20/2018   Procedure: POLYPECTOMY;  Surgeon: Malissa Hippo, MD;  [facility];  Service: Endoscopy;;  Hepatic flexure polyp cold snare, splenic flexure polyp   SPHINCTEROTOMY  01/07/2012   Procedure: SPHINCTEROTOMY;  Surgeon: Malissa Hippo, MD;  Location: [facility];  Service: Endoscopy;  Laterality: N/A;  Extended   SPYGLASS CHOLANGIOSCOPY  02/21/2012    Procedure: SPYGLASS CHOLANGIOSCOPY;  Surgeon: Malissa Hippo, MD;  Location: [facility];  Service: Endoscopy;  Laterality:  N/A;   SPYGLASS CHOLANGIOSCOPY N/A 05/18/2012   Procedure: SPYGLASS CHOLANGIOSCOPY;  Surgeon: Malissa Hippo, MD;  Location: [facility];  Service: Endoscopy;  Laterality: N/A;   TEE WITHOUT CARDIOVERSION N/A 03/09/2021   Procedure: TRANSESOPHAGEAL ECHOCARDIOGRAM (TEE);  Surgeon: Corliss Skains, MD;  Location[facility] OR;  Service: Open Heart Surgery;  Laterality: N/A;   TOTAL KNEE ARTHROPLASTY  03/21/2012   Procedure: TOTAL KNEE ARTHROPLASTY;  Surgeon: [name], MD;  Location: [facility] OR;  Service: Orthopedics;  Laterality: Right;  right knee arthroplasty   TOTAL KNEE ARTHROPLASTY Left 04/08/2019   Procedure: LEFT TOTAL KNEE ARTHROPLASTY;  Surgeon: Gean Birchwood, MD;  Location: [facility] ORS;  Service: Orthopedics;  Laterality: Left;    Social History   Socioeconomic History   Marital status: Married    Spouse name: Not on file   Number of children: 2   Years of education: Not on file   Highest education level: Not on file  Occupational History   Occupation: Retired Careers information officer: RETIRED  Tobacco Use   Smoking status: Never   Smokeless tobacco: Never  Vaping Use   Vaping status: Never Used  Substance and Sexual Activity   Alcohol use: No    Alcohol/week: 0.0 standard drinks of alcohol   Drug use: No   Sexual activity: Not on file  Other Topics Concern   Not on file  Social History Narrative   Not on file   Social Determinants of Health   Financial Resource Strain: Not on file  Food Insecurity: Not on file  Transportation Needs: Not on file  Physical Activity: Not on file  Stress: Not on file  Social Connections: Unknown (10/12/2022)   [facility]   Social Network    Social Network: Not on file  Intimate Partner Violence: Unknown (10/12/2022)   Received from [facility]   HITS    Physically Hurt: Not on file    Insult or Talk Down  To: Not on file    Threaten Physical Harm: Not on file    Scream or Curse: Not on file    Family History  Problem Relation Age of Onset   Diabetes Mother    Coronary artery disease Mother    Coronary artery disease Father    Diabetes Father    Stomach cancer Father    Deep vein thrombosis Daughter    Hypotension Neg Hx    Anesthesia problems Neg Hx    Malignant hyperthermia Neg Hx    Pseudochol deficiency Neg Hx    Colon cancer Neg Hx        Objective: Vitals:   12/29/22 1329  BP: (!) 168/62  Pulse: 60     Physical Exam Vitals reviewed.  Constitutional:      Appearance: Normal appearance.  Neurological:     Mental Status: He is alert.     Lab Results:  Results for orders placed or performed in visit on 12/29/22 (from the past 24 hour(s))  Urinalysis, Routine w reflex microscopic     Status: None   Collection Time: 12/29/22  1:24 PM  Result Value Ref Range   Specific Gravity, UA 1.025 1.005 - 1.030   pH, UA 6.0 5.0 - 7.5   Color, UA Yellow Yellow   Appearance Ur Clear Clear   Leukocytes,UA Negative Negative   Protein,UA Negative Negative/Trace   Glucose, UA Negative Negative   Ketones, UA Negative Negative   RBC, UA Negative Negative   Bilirubin, UA  Negative Negative   Urobilinogen, Ur 1.0 0.2 - 1.0 mg/dL   Nitrite, UA Negative Negative   Microscopic Examination Comment    Narrative   Performed at:  [address] - [facility] [address], [place], [place]  [id number] Lab Director: Chinita Pester MT, Phone:  [phone]        BMET No results for input(s): "NA", "K", "CL", "CO2", "GLUCOSE", "BUN", "CREATININE", "CALCIUM" in the last 72 hours. PSA <0.1 in 9/20. <0.1 in 10/21 <0.1 in 12/22 <0.1 in 12/23 UA reviewed.  Clear Culture grew proteus.    Studies/Results: CUP PACEART REMOTE DEVICE CHECK  Result Date: 12/26/2022 Scheduled remote reviewed. Normal device function.  Next remote 91 days. LA, CVRS  CUP PACEART INCLINIC DEVICE  CHECK  Result Date: 12/23/2022 Patient seen in device clinic to decrease HVR detection rate from 175 bpm to 150 bpm per Dr. Ladona Ridgel. See EPIC note 12/22/22.Sharia Reeve, BSN, RN  DG Foot Complete Left  Result Date: 12/13/2022 CLINICAL DATA:  Fall with pain, unable to put weight on left foot. EXAM: LEFT FOOT - COMPLETE 3+ VIEW COMPARISON:  None Available. FINDINGS: A tiny bony density is present along the dorsal aspect of the middle phalanx of the second digit. Hammertoe deformity is noted at the second digit with hyperflexion of the proximal interphalangeal joint. There is hallux valgus deformity at the first metatarsophalangeal joint with degenerative changes. There is midfoot collapse with degenerative changes. A large calcaneal spur is noted. Soft tissue swelling is present about the ankle and over the dorsum of the foot. IMPRESSION: 1. Bony density along the dorsal aspect of the base of the middle phalanx of the second digit, possible avulsion fracture of indeterminate age. Examination of the second digit is limited due to hammertoe deformity. 2. Hallux valgus deformity and degenerative changes at the first metatarsophalangeal joint. 3. Scattered degenerative changes in the midfoot and hindfoot. Electronically Signed   By: Thornell Sartorius M.D.   On: 12/13/2022 21:46   MR CERVICAL SPINE WO CONTRAST  Result Date: 12/05/2022 CLINICAL DATA:  Chronic neck pain.  Previous thoracic fusion. EXAM: MRI CERVICAL SPINE WITHOUT CONTRAST TECHNIQUE: Multiplanar, multisequence MR imaging of the cervical spine was performed. No intravenous contrast was administered. COMPARISON:  Radiography 08/05/2022 FINDINGS: Alignment: Mild cervicothoracic curvature convex to the left. Chronic fixed anterolisthesis at T2-3 of about 6-7 mm with kyphotic deformity. There is bone fusion across this and no change or dynamic process would be expected at this level. Vertebrae: No bone abnormality seen in the cervical region. Cord: No cord  compression or focal cord lesion. Narrowing of the ventral subarachnoid space in the upper thoracic region because of the kyphotic deformity, but ample subarachnoid spaces present dorsal to the cord. Posterior Fossa, vertebral arteries, paraspinal tissues: Negative Disc levels: The foramen magnum is widely patent. There is ordinary mild osteoarthritis of the C1-2 articulation but no encroachment upon the neural structures. C2-3: Facet osteoarthritis left worse than right. Mild left foraminal narrowing. C3-4: Endplate osteophytes and bulging of the disc. Facet osteoarthritis left worse than right. No compressive canal stenosis. Bilateral foraminal narrowing right worse than left. C4-5: Endplate osteophytes and shallow protrusion of the disc. Slight caudal migration of disc material in the midline. Narrowing of the ventral subarachnoid space but no compressive effect upon the cord. Bilateral foraminal narrowing that could affect either C5 nerve. C5-6: Endplate osteophytes and shallow protrusion of the disc more prominent towards the left. Narrowing of the ventral subarachnoid space but no compression of the cord.  Bilateral foraminal stenosis, left worse than right. C6-7: Endplate osteophytes and broad-based disc herniation with caudal migration of disc material. Effacement of the subarachnoid space and slight indentation of the cord to the right of midline, but no frank cord compression or abnormal cord signal. Bilateral foraminal narrowing that could affect either C7 nerve. C7-T1: Mild bulging of the disc.  No canal or foraminal stenosis. T1-2: Shallow disc protrusion indents the ventral subarachnoid space but does not compress the cord. Bilateral facet osteoarthritis at this level. Bilateral foraminal narrowing, right worse than left. The right T1 nerve could be affected. IMPRESSION: 1. Chronic kyphotic deformity at the T2-3 level with fixed anterolisthesis of about 6-7 mm. There is bone fusion across this level. 2.  Degenerative spondylosis and facet arthropathy at C2-3 and C3-4 with bilateral foraminal narrowing that could possibly affect the exiting nerves. 3. C4-5: Spondylosis with disc protrusion and slight caudal migration of disc material in the midline. Narrowing of the ventral subarachnoid space but no compressive effect upon the cord. Bilateral foraminal stenosis that could affect either C5 nerve. 4. C5-6: Spondylosis with shallow protrusion of the disc more prominent towards the left. Narrowing of the ventral subarachnoid space but no compression of the cord. Bilateral foraminal stenosis left worse than right could affect either C6 nerve. 5. C6-7: Spondylosis with broad-based disc herniation with caudal migration of disc material. Effacement of the subarachnoid space and slight indentation of the cord to the right of midline, but no frank cord compression or abnormal cord signal. Bilateral foraminal stenosis that could affect either C7 nerve. 6. T1-2: Shallow disc protrusion. Facet osteoarthritis. Bilateral foraminal narrowing, right worse than left. The right T1 nerve could be affected. Electronically Signed   By: Paulina Fusi M.D.   On: 12/05/2022 11:23     Assessment & Plan: UTI with microhematuria.  He had proteus which has cleared.  I will get him back on TMP and he will return for f/u in January.   Membranous stricture from radiation.  He has a variable stream. He will keep his f/u in November.   History of prostate cancer.  Continue PSA f/u.   History of stones.  He had only a punctate stone on his last CT.   Meds ordered this encounter  Medications   trimethoprim (TRIMPEX) 100 MG tablet    Sig: Take 1 tablet (100 mg total) by mouth at bedtime.    Dispense:  30 tablet    Refill:  11     Orders Placed This Encounter  Procedures   Urinalysis, Routine w reflex microscopic      Return for Keep scheduled f/u.   CC: Walter Stabile, MD      Bjorn Pippin 12/30/2022 Patient ID: Hardin Negus,  male   DOB: 23-May-1940, 82 y.o.   MRN: [id number]

## 2022-12-29 NOTE — Telephone Encounter (Signed)
-----   Message from Nona Dell sent at 12/23/2022  3:26 PM EDT ----- Regarding: RE: Eliquis denial If he is denied assistance for Eliquis, presumably the same would be the case for Xarelto.  If he is not able to afford this, could consider switching him to Coumadin with follow-up in the anticoagulation clinic if he would like. ----- Message ----- From: Kerney Elbe, LPN Sent: 07/24/6946  10:30 AM EDT To: Jonelle Sidle, MD; [facility] Subject: Eliquis denial                                 Good morning, I have this pt that was denied for pt assistance for Eliquis D/T still needing to pay $926.74 OOP. He is in the office requesting samples. Pt states that he is unable to pay this. Please advise.

## 2023-01-10 NOTE — Progress Notes (Signed)
Remote pacemaker transmission.   

## 2023-02-09 ENCOUNTER — Telehealth: Payer: Self-pay | Admitting: Cardiology

## 2023-02-09 MED ORDER — APIXABAN 2.5 MG PO TABS: 2.5000 mg | ORAL_TABLET | Freq: Two times a day (BID) | ORAL | 0 refills | Status: DC

## 2023-02-09 NOTE — Telephone Encounter (Signed)
Patient calling the office for samples of medication:   1.  What medication and dosage are you requesting samples for?  apixaban (ELIQUIS) 2.5 MG TABS tablet   2.  Are you currently out of this medication?   Patient stated he will need to get some samples as he is in the donut hole.

## 2023-02-09 NOTE — Telephone Encounter (Signed)
Patient notified and verbalized understanding that samples are available at the front desk for him.    Called [employer] to inquire about pt's PAF and was informed that pt needed to meet 3% oop deductible for the year totaling $926.74.

## 2023-02-09 NOTE — Telephone Encounter (Signed)
Sample request for Eliquis received. Indication: PAF Last office visit: 08/04/22  Rosette Reveal MD Scr: 1.74 on 03/24/22  Epic Age: 82 Weight: 105.2kg  Based on above findings Eliquis 2.5mg  twice daily is the appropriate dose.  OK to provide samples if available.

## 2023-02-13 ENCOUNTER — Ambulatory Visit (INDEPENDENT_AMBULATORY_CARE_PROVIDER_SITE_OTHER): Payer: PPO | Admitting: Gastroenterology

## 2023-02-13 ENCOUNTER — Encounter (INDEPENDENT_AMBULATORY_CARE_PROVIDER_SITE_OTHER): Payer: Self-pay | Admitting: Gastroenterology

## 2023-02-13 ENCOUNTER — Ambulatory Visit: Payer: PPO | Attending: Nurse Practitioner | Admitting: Nurse Practitioner

## 2023-02-13 ENCOUNTER — Encounter: Payer: Self-pay | Admitting: Nurse Practitioner

## 2023-02-13 VITALS — BP 130/60 | HR 60 | Ht 74.0 in | Wt 236.4 lb

## 2023-02-13 VITALS — BP 138/70 | HR 60 | Temp 97.8°F | Ht 74.0 in | Wt 236.5 lb

## 2023-02-13 DIAGNOSIS — I951 Orthostatic hypotension: Secondary | ICD-10-CM | POA: Diagnosis not present

## 2023-02-13 DIAGNOSIS — N1832 Chronic kidney disease, stage 3b: Secondary | ICD-10-CM | POA: Diagnosis not present

## 2023-02-13 DIAGNOSIS — M25512 Pain in left shoulder: Secondary | ICD-10-CM | POA: Diagnosis not present

## 2023-02-13 DIAGNOSIS — K581 Irritable bowel syndrome with constipation: Secondary | ICD-10-CM

## 2023-02-13 DIAGNOSIS — I10 Essential (primary) hypertension: Secondary | ICD-10-CM

## 2023-02-13 DIAGNOSIS — I25119 Atherosclerotic heart disease of native coronary artery with unspecified angina pectoris: Secondary | ICD-10-CM

## 2023-02-13 DIAGNOSIS — I480 Paroxysmal atrial fibrillation: Secondary | ICD-10-CM

## 2023-02-13 DIAGNOSIS — E785 Hyperlipidemia, unspecified: Secondary | ICD-10-CM | POA: Diagnosis not present

## 2023-02-13 MED ORDER — LINACLOTIDE 145 MCG PO CAPS: 145.0000 ug | ORAL_CAPSULE | Freq: Every day | ORAL | 5 refills | Status: DC

## 2023-02-13 NOTE — Progress Notes (Signed)
Referring Provider: Benita Stabile, MD Primary Care Physician:  Benita Stabile, MD Primary GI Physician: Dr. Levon Hedger   Chief Complaint  Patient presents with   Irritable Bowel Syndrome    Follow up on IBS -C. Takes two colace at night. Took samples of linzess 145 and states it did well but did not call back to get  rx to continue med.    HPI:   [name] is a 82 y.o. male with past medical history of  prostate cancer s/p radiation seeds, CKD, hypertension, GERD, IBS, Schatzki's ring   Patient presenting today for follow up of IBS-C  Last seen September 2024, at that time continued to have some lower abdominal pain.  Eating does not change pain.  Having constipation taking stool softeners through the morning 3 at night.  Having a BM maybe 3 times per week.  Stools remain hard even on stool softeners.  Pain does improve with defecation.  Patient was recommended to continue with good water intake, Linzess samples 145 mcg daily, consider repeat colonoscopy constipation improves and abdominal pain persist.  Patient states he did well on linzess samples having 1 BM per day when he was taking this.  He is eating some english walnuts each day and taking 2 colace in the mornings and sometimes 2 at night. Having a BM maybe every 2 days. Denies need to strain. Water intake is good. Has some ongoing lower abdominal pain that he has had for some time. He does not think that this improves with defecation. Notes that he has the main most of the time. Does not think anything makes it better. Does not think that eating changes his pain. Denies rectal bleeding or melena. Appetite is good. No weight loss. No nausea or vomiting.   Virtual Colonoscopy: 10/2018 No significant colonic polyp, mass, apple core lesion, or stricture. Brachytherapy seeds in the prostate. No evidence of recurrent or metastatic disease 2020- Perianal skin tags found on perianal exam.                           - Incomplete exam to  hepatic flexure.                           - One 7 mm polyp at the hepatic flexure, removed                            with a cold snare. Resected and retrieved.                           - One 5 mm polyp at the splenic flexure, removed                            with a cold snare. Resected and retrieved.                           - Diverticulosis in the sigmoid colon.                           - External hemorrhoids tubular adenoma and the other one is sessile serrated polyp. His exam was incomplete to hepatic flexure.   Past Medical History:  Diagnosis Date  Arthritis    CAD (coronary artery disease)    Two-vessel obstructive disease November 2022; CABG with LIMA to LAD and SVG to PDA January 2023   Chronic back pain    CKD (chronic kidney disease) stage 3, GFR 30-59 ml/min (HCC)    Essential hypertension    GERD (gastroesophageal reflux disease)    Helicobacter pylori gastritis    Treated   History of kidney stones    Hx of adenomatous colonic polyps    IBS (irritable bowel syndrome)    PAF (paroxysmal atrial fibrillation) (HCC)    Prostate CA (HCC)    Seed implants   Schatzki's ring    Stroke [facility])     Past Surgical History:  Procedure Laterality Date   APPENDECTOMY  age 52   BACK SURGERY  02/08   [facility]   BACK SURGERY  09/24/04   lumbar, [facility]   BACK SURGERY  09/18/02   [facility]   BILIARY STENT PLACEMENT  01/07/2012   Procedure: BILIARY STENT PLACEMENT;  Surgeon: Malissa Hippo, MD;  Location: [facility];  Service: Endoscopy;  Laterality: N/A;   BILIARY STENT PLACEMENT  02/21/2012   Procedure: BILIARY STENT PLACEMENT;  Surgeon: Malissa Hippo, MD;  Location: [facility];  Service: Endoscopy;  Laterality: N/A;  Biliary Stent Replacement   CATARACT EXTRACTION W/PHACO  11/22/2010   Procedure: CATARACT EXTRACTION PHACO AND INTRAOCULAR LENS PLACEMENT (IOC);  Surgeon: Susa Simmonds;  Location: [facility];  Service: Ophthalmology;  Laterality: Right;  CDE: 6.81   CHOLECYSTECTOMY  03/04/05    [facility], [facility]. Gangrenous cholecystitis complicated by abscess requiring percutaneous drainage. He also had common duct stones requiring ERCP and sphincterotomy.   COLONOSCOPY  August 2005   Scattered sigmoid diverticulosis, splenic flexure polyp. Hyperplastic   COLONOSCOPY  1997   3 cm tubular adenoma and a sending colon   COLONOSCOPY  02/01/2011   Rourk-friable anal canal, hyperplastic rectal polyp   COLONOSCOPY N/A 08/15/2013   Procedure: COLONOSCOPY;  Surgeon: Malissa Hippo, MD;  Location: [facility];  Service: Endoscopy;  Laterality: N/A;  100   COLONOSCOPY N/A 09/20/2018   Procedure: COLONOSCOPY;  Surgeon: Malissa Hippo, MD;  [facility]: [facility];  Service: Endoscopy;  Laterality: N/A;  12:00pm   CORONARY ARTERY BYPASS GRAFT N/A 03/09/2021   Procedure: OFF PUMP CORONARY ARTERY BYPASS GRAFTING (CABG) X2, USING LEFT INTERNAL MAMMARY ARTERY AND RIGHT LEG GREATER SAPHENOUS VEIN HARVESTED ENDOSCOPICALLY;  Surgeon: Corliss Skains, MD;  Location: [facility] OR;  Service: Open Heart Surgery;  Laterality: N/A;  X 2   ERCP  03/02/05   [facility], [facility]. Normal-appearing biliary tree (gallbladder not image), status post sphincterotomy with recovery of small pieces of stone material, status post balloon occlusion cholangiogram.   ERCP  01/07/2012   Procedure: ENDOSCOPIC RETROGRADE CHOLANGIOPANCREATOGRAPHY (ERCP);  Surgeon: Malissa Hippo, MD;  Location: [facility];  Service: Endoscopy;  Laterality: N/A;  possible biliary stenting   ERCP  02/21/2012   Procedure: ENDOSCOPIC RETROGRADE CHOLANGIOPANCREATOGRAPHY (ERCP);  Surgeon: Malissa Hippo, MD;  Location: [facility];  Service: Endoscopy;  Laterality: N/A;  common bile duct stone and clip removed   ERCP N/A 05/18/2012   Procedure: ENDOSCOPIC RETROGRADE CHOLANGIOPANCREATOGRAPHY (ERCP);  Surgeon: Malissa Hippo, MD;  Location: [facility];  Service: Endoscopy;  Laterality: N/A;  stone and debri removal   ESOPHAGOGASTRODUODENOSCOPY  August 2005   Erosive  esophagitis and Schatzki ring, small hiatal hernia   ESOPHAGOGASTRODUODENOSCOPY  01/2008   Mild reflux esophagitis, small hiatal  hernia   ESOPHAGOGASTRODUODENOSCOPY  02/01/2011   Rourk-erosive reflux esophagitis, small hiatal hernia   ESOPHAGOGASTRODUODENOSCOPY  10/26/2011   Dr. Kerby Moors gastric submucosal petechia (bx-benign ulceration), juxta ampullary duodenal diverticulum and some mucosal edema involving 1st/2nd portion of duodenum with superficial erosions (bx-superficial ulceration/benign)   ESOPHAGOGASTRODUODENOSCOPY  02/21/2012   Procedure: ESOPHAGOGASTRODUODENOSCOPY (EGD);  Surgeon: Malissa Hippo, MD;  Location: [facility];  Service: Endoscopy;  Laterality: N/A;   EYE SURGERY     Incomplete colonoscopy  December 2009   Left-sided diverticula, mid descending colon polyp, due to recurrent looping and redundancy exam was incomplete. It was felt that the mid colon was reached. Followup barium enema showed colon interposition between the liver and the diaphragm, redundant sigmoid colon but no colon mass or polyp identified.. Pathology revealed tubular adenoma.   LEFT HEART CATH AND CORONARY ANGIOGRAPHY N/A 01/18/2021   Procedure: LEFT HEART CATH AND CORONARY ANGIOGRAPHY;  Surgeon: Runell Gess, MD;  [facility];  Service: Cardiovascular;  Laterality: N/A;   PACEMAKER IMPLANT N/A 12/22/2021   Procedure: PACEMAKER IMPLANT;  Surgeon: Marinus Maw, MD;  [facility]: [facility];  Service: Cardiovascular;  Laterality: N/A;   POLYPECTOMY  09/20/2018   Procedure: POLYPECTOMY;  Surgeon: Malissa Hippo, MD;  [facility];  Service: Endoscopy;;  Hepatic flexure polyp cold snare, splenic flexure polyp   SPHINCTEROTOMY  01/07/2012   Procedure: SPHINCTEROTOMY;  Surgeon: Malissa Hippo, MD;  Location: [facility];  Service: Endoscopy;  Laterality: N/A;  Extended   SPYGLASS CHOLANGIOSCOPY  02/21/2012   Procedure: SPYGLASS CHOLANGIOSCOPY;  Surgeon: Malissa Hippo, MD;   Location: [facility];  Service: Endoscopy;  Laterality: N/A;   SPYGLASS CHOLANGIOSCOPY N/A 05/18/2012   Procedure: SPYGLASS CHOLANGIOSCOPY;  Surgeon: Malissa Hippo, MD;  Location: [facility];  Service: Endoscopy;  Laterality: N/A;   TEE WITHOUT CARDIOVERSION N/A 03/09/2021   Procedure: TRANSESOPHAGEAL ECHOCARDIOGRAM (TEE);  Surgeon: Corliss Skains, MD;  Location: [facility];  Service: Open Heart Surgery;  Laterality: N/A;   TOTAL KNEE ARTHROPLASTY  03/21/2012   Procedure: TOTAL KNEE ARTHROPLASTY;  Surgeon: [name], MD;  Location: [facility];  Service: Orthopedics;  Laterality: Right;  right knee arthroplasty   TOTAL KNEE ARTHROPLASTY Left 04/08/2019   Procedure: LEFT TOTAL KNEE ARTHROPLASTY;  Surgeon: Gean Birchwood, MD;  Location: [facility];  Service: Orthopedics;  Laterality: Left;    Current Outpatient Medications  Medication Sig Dispense Refill   acetaminophen (TYLENOL) 500 MG tablet Take 500 mg by mouth every 6 (six) hours as needed (for pain.).     amLODipine (NORVASC) 2.5 MG tablet Take 1 tablet (2.5 mg total) by mouth daily. 90 tablet 3   apixaban (ELIQUIS) 2.5 MG TABS tablet Take 1 tablet (2.5 mg total) by mouth 2 (two) times daily. 56 tablet 0   atorvastatin (LIPITOR) 20 MG tablet TAKE ONE (1) TABLET BY MOUTH EVERY DAY 90 tablet 3   Docusate Sodium (COLACE PO) Take by mouth. Takes 2 at night     ezetimibe (ZETIA) 10 MG tablet Take 10 mg by mouth at bedtime.     trimethoprim (TRIMPEX) 100 MG tablet Take 1 tablet (100 mg total) by mouth at bedtime. 30 tablet 11   No current facility-administered medications for this visit.    Allergies as of 02/13/2023   (No Known Allergies)    Family History  Problem Relation Age of Onset   Diabetes Mother    Coronary artery disease Mother  Coronary artery disease Father    Diabetes Father    Stomach cancer Father    Deep vein thrombosis Daughter    Hypotension Neg Hx    Anesthesia problems Neg Hx    Malignant hyperthermia Neg Hx    Pseudochol  deficiency Neg Hx    Colon cancer Neg Hx     Social History   Socioeconomic History   Marital status: Married    Spouse name: Not on file   Number of children: 2   Years of education: Not on file   Highest education level: Not on file  Occupational History   Occupation: Retired Careers information officer: RETIRED  Tobacco Use   Smoking status: Never   Smokeless tobacco: Never  Vaping Use   Vaping status: Never Used  Substance and Sexual Activity   Alcohol use: No    Alcohol/week: 0.0 standard drinks of alcohol   Drug use: No   Sexual activity: Not on file  Other Topics Concern   Not on file  Social History Narrative   Not on file   Social Drivers of Health   Financial Resource Strain: Not on file  Food Insecurity: Not on file  Transportation Needs: Not on file  Physical Activity: Not on file  Stress: Not on file  Social Connections: Unknown (10/12/2022)   Received from [employer]   Social Network    Social Network: Not on file    Review of systems General: negative for malaise, night sweats, fever, chills, weight loss Neck: Negative for lumps, goiter, pain and significant neck swelling Resp: Negative for cough, wheezing, dyspnea at rest CV: Negative for chest pain, leg swelling, palpitations, orthopnea GI: denies melena, hematochezia, nausea, vomiting, diarrhea, dysphagia, odyonophagia, early satiety or unintentional weight loss.  + Constipation + lower abdominal pain MSK: Negative for joint pain or swelling, back pain, and muscle pain. Derm: Negative for itching or rash Psych: Denies depression, anxiety, memory loss, confusion. No homicidal or suicidal ideation.  Heme: Negative for prolonged bleeding, bruising easily, and swollen nodes. Endocrine: Negative for cold or heat intolerance, polyuria, polydipsia and goiter. Neuro: negative for tremor, gait imbalance, syncope and seizures. The remainder of the review of systems is noncontributory.  Physical Exam: BP  138/70   Pulse 60   Temp 97.8 F (36.6 C) (Oral)   Ht 6\' 2"  (1.88 m)   Wt 236 lb 8 oz (107.3 kg)   BMI 30.36 kg/m  General:   Alert and oriented. No distress noted. Pleasant and cooperative.  Head:  Normocephalic and atraumatic. Eyes:  Conjuctiva clear without scleral icterus. Mouth:  Oral mucosa pink and moist. Good dentition. No lesions. Heart: Normal rate and rhythm, s1 and s2 heart sounds present.  Lungs: Clear lung sounds in all lobes. Respirations equal and unlabored. Abdomen:  +BS, soft, non-tender and non-distended. No rebound or guarding. No HSM or masses noted. Derm: No palmar erythema or jaundice Msk:  Symmetrical without gross deformities. Normal posture. Extremities:  Without edema. Neurologic:  Alert and  oriented x4 Psych:  Alert and cooperative. Normal mood and affect.  Invalid input(s): "6 MONTHS"   ASSESSMENT: [name] is a 82 y.o. male presenting today for follow up of IBS-C  Patient did well on Linzess 145 mcg samples though ran out and did not call for prescription.  He is taking 2 Colace usually twice daily and eating a handful of English walnuts daily which seem to be helping some with moving his bowels.  Still  having a bowel movement about every 2 days and having some lower abdominal pain.  Does think abdominal pain may have been improved when he was on Linzess.  No red flag symptoms.  For now we will send prescription of Linzess 145 mcg for him to take daily, should continue with good water intake, diet high in fruits, veggies, whole grains.  Can stop Colace at this time.  May consider repeat colonoscopy if abdominal pain does not improve with regular use of Linzess and improvement in his constipation.  PLAN:  Linzess 145 mcg daily  2. Increase water intake, aim for atleast 64 oz per day Increase fruits, veggies and whole grains, kiwi and prunes are especially good for constipation 3.  Stop Colace  All questions were answered, patient verbalized  understanding and is in agreement with plan as outlined above.   Follow Up: 3 months  Joliyah Lippens L. Jeanmarie Hubert, MSN, APRN, AGNP-C Adult-Gerontology Nurse Practitioner [facility]

## 2023-02-13 NOTE — Patient Instructions (Signed)
-  I have sent Linzess 145 mcg daily to your pharmacy  -stop colace, can continue with walnuts as you are doing if you feel these help your constipation  -aim for atleast 64 oz  of water per day -Increase fruits, veggies and whole grains, kiwi and prunes are especially good for constipation  Follow up 3 months  It was a pleasure to see you today. I want to create trusting relationships with patients and provide genuine, compassionate, and quality care. I truly value your feedback! please be on the lookout for a survey regarding your visit with me today. I appreciate your input about our visit and your time in completing this!    Apolonia Ellwood L. Jeanmarie Hubert, MSN, APRN, AGNP-C Adult-Gerontology Nurse Practitioner [facility]

## 2023-02-13 NOTE — Progress Notes (Signed)
Office Visit    Patient Name: [name] Date of Encounter: 02/13/2023  Primary Care Provider:  Benita Stabile, MD Primary Cardiologist:  Nona Dell, MD  Chief Complaint    82 y.o. male with history of CAD status post two-vessel CABG in January 2023, hypertension, hyperlipidemia, diastolic dysfunction, stroke, left upper extremity DVT, stage III chronic kidney disease, sinus node dysfunction status post permanent pacemaker placement in November 2023, paroxysmal atrial fibrillation, prostate cancer, irritable bowel syndrome, and chronic back pain, who presents for CAD follow-up.  Past Medical History  Subjective   Past Medical History:  Diagnosis Date   Arthritis    CAD (coronary artery disease)    Two-vessel obstructive disease November 2022; CABG with LIMA to LAD and SVG to PDA January 2023   Chronic back pain    CKD (chronic kidney disease) stage 3, GFR 30-59 ml/min (HCC)    Diastolic dysfunction    a. 10/2021 Echo: EF 55-60%, no rwma, GrI DD, nl RV fxn. Mildly dil LA. Triv MR/AI. AoV sclerosis w/o stenosis. Asc Ao 38mm.   Essential hypertension    GERD (gastroesophageal reflux disease)    Helicobacter pylori gastritis    Treated   History of kidney stones    Hx of adenomatous colonic polyps    IBS (irritable bowel syndrome)    PAF (paroxysmal atrial fibrillation) (HCC)    Prostate CA (HCC)    Seed implants   Schatzki's ring    Sinus node dysfunction (HCC)    a. 12/2021 s/p SJM PPM.   Stroke [facility])    Past Surgical History:  Procedure Laterality Date   APPENDECTOMY  age 1   BACK SURGERY  02/08   [facility]   BACK SURGERY  09/24/04   lumbar, [facility]   BACK SURGERY  09/18/02   [facility]   BILIARY STENT PLACEMENT  01/07/2012   Procedure: BILIARY STENT PLACEMENT;  Surgeon: Malissa Hippo, MD;  Location: [facility] ORS;  Service: Endoscopy;  Laterality: N/A;   BILIARY STENT PLACEMENT  02/21/2012   Procedure: BILIARY STENT PLACEMENT;  Surgeon: Malissa Hippo, MD;  Location: [facility];   Service: Endoscopy;  Laterality: N/A;  Biliary Stent Replacement   CATARACT EXTRACTION W/PHACO  11/22/2010   Procedure: CATARACT EXTRACTION PHACO AND INTRAOCULAR LENS PLACEMENT (IOC);  Surgeon: Susa Simmonds;  Location: [facility];  Service: Ophthalmology;  Laterality: Right;  CDE: 6.81   CHOLECYSTECTOMY  03/04/05   [facility], [facility]. Gangrenous cholecystitis complicated by abscess requiring percutaneous drainage. He also had common duct stones requiring ERCP and sphincterotomy.   COLONOSCOPY  August 2005   Scattered sigmoid diverticulosis, splenic flexure polyp. Hyperplastic   COLONOSCOPY  1997   3 cm tubular adenoma and a sending colon   COLONOSCOPY  02/01/2011   Rourk-friable anal canal, hyperplastic rectal polyp   COLONOSCOPY N/A 08/15/2013   Procedure: COLONOSCOPY;  Surgeon: Malissa Hippo, MD;  Location: [facility];  Service: Endoscopy;  Laterality: N/A;  100   COLONOSCOPY N/A 09/20/2018   Procedure: COLONOSCOPY;  Surgeon: Malissa Hippo, MD;  Location: [facility];  Service: Endoscopy;  Laterality: N/A;  12:00pm   CORONARY ARTERY BYPASS GRAFT N/A 03/09/2021   Procedure: OFF PUMP CORONARY ARTERY BYPASS GRAFTING (CABG) X2, USING LEFT INTERNAL MAMMARY ARTERY AND RIGHT LEG GREATER SAPHENOUS VEIN HARVESTED ENDOSCOPICALLY;  Surgeon: Corliss Skains, MD;  Location: [facility] OR;  Service: Open Heart Surgery;  Laterality: N/A;  X 2   ERCP  03/02/05   [facility],  Rourk. Normal-appearing biliary tree (gallbladder not image), status post sphincterotomy with recovery of small pieces of stone material, status post balloon occlusion cholangiogram.   ERCP  01/07/2012   Procedure: ENDOSCOPIC RETROGRADE CHOLANGIOPANCREATOGRAPHY (ERCP);  Surgeon: Malissa Hippo, MD;  Location: [facility];  Service: Endoscopy;  Laterality: N/A;  possible biliary stenting   ERCP  02/21/2012   Procedure: ENDOSCOPIC RETROGRADE CHOLANGIOPANCREATOGRAPHY (ERCP);  Surgeon: Malissa Hippo, MD;  Location: [facility];  Service: Endoscopy;  Laterality:  N/A;  common bile duct stone and clip removed   ERCP N/A 05/18/2012   Procedure: ENDOSCOPIC RETROGRADE CHOLANGIOPANCREATOGRAPHY (ERCP);  Surgeon: Malissa Hippo, MD;  Location: [facility];  Service: Endoscopy;  Laterality: N/A;  stone and debri removal   ESOPHAGOGASTRODUODENOSCOPY  August 2005   Erosive esophagitis and Schatzki ring, small hiatal hernia   ESOPHAGOGASTRODUODENOSCOPY  01/2008   Mild reflux esophagitis, small hiatal hernia   ESOPHAGOGASTRODUODENOSCOPY  02/01/2011   Rourk-erosive reflux esophagitis, small hiatal hernia   ESOPHAGOGASTRODUODENOSCOPY  10/26/2011   Dr. Kerby Moors gastric submucosal petechia (bx-benign ulceration), juxta ampullary duodenal diverticulum and some mucosal edema involving 1st/2nd portion of duodenum with superficial erosions (bx-superficial ulceration/benign)   ESOPHAGOGASTRODUODENOSCOPY  02/21/2012   Procedure: ESOPHAGOGASTRODUODENOSCOPY (EGD);  Surgeon: Malissa Hippo, MD;  Location: [facility];  Service: Endoscopy;  Laterality: N/A;   EYE SURGERY     Incomplete colonoscopy  December 2009   Left-sided diverticula, mid descending colon polyp, due to recurrent looping and redundancy exam was incomplete. It was felt that the mid colon was reached. Followup barium enema showed colon interposition between the liver and the diaphragm, redundant sigmoid colon but no colon mass or polyp identified.. Pathology revealed tubular adenoma.   LEFT HEART CATH AND CORONARY ANGIOGRAPHY N/A 01/18/2021   Procedure: LEFT HEART CATH AND CORONARY ANGIOGRAPHY;  Surgeon: Runell Gess, MD;  [facility];  Service: Cardiovascular;  Laterality: N/A;   PACEMAKER IMPLANT N/A 12/22/2021   Procedure: PACEMAKER IMPLANT;  Surgeon: Marinus Maw, MD;  Location: [facility];  Service: Cardiovascular;  Laterality: N/A;   POLYPECTOMY  09/20/2018   Procedure: POLYPECTOMY;  Surgeon: Malissa Hippo, MD;  [facility];  Service: Endoscopy;;  Hepatic flexure  polyp cold snare, splenic flexure polyp   SPHINCTEROTOMY  01/07/2012   Procedure: SPHINCTEROTOMY;  Surgeon: Malissa Hippo, MD;  Location: [facility];  Service: Endoscopy;  Laterality: N/A;  Extended   SPYGLASS CHOLANGIOSCOPY  02/21/2012   Procedure: SPYGLASS CHOLANGIOSCOPY;  Surgeon: Malissa Hippo, MD;  Location: [facility];  Service: Endoscopy;  Laterality: N/A;   SPYGLASS CHOLANGIOSCOPY N/A 05/18/2012   Procedure: SPYGLASS CHOLANGIOSCOPY;  Surgeon: Malissa Hippo, MD;  Location: [facility];  Service: Endoscopy;  Laterality: N/A;   TEE WITHOUT CARDIOVERSION N/A 03/09/2021   Procedure: TRANSESOPHAGEAL ECHOCARDIOGRAM (TEE);  Surgeon: Corliss Skains, MD;  Location: [facility] OR;  Service: Open Heart Surgery;  Laterality: N/A;   TOTAL KNEE ARTHROPLASTY  03/21/2012   Procedure: TOTAL KNEE ARTHROPLASTY;  Surgeon: [name], MD;  Location: [facility] OR;  Service: Orthopedics;  Laterality: Right;  right knee arthroplasty   TOTAL KNEE ARTHROPLASTY Left 04/08/2019   Procedure: LEFT TOTAL KNEE ARTHROPLASTY;  Surgeon: Gean Birchwood, MD;  Location: [facility]S;  Service: Orthopedics;  Laterality: Left;    Allergies  No Known Allergies    History of Present Illness      82 y.o. y/o male with history of CAD status post two-vessel CABG in January 2023, hypertension, hyperlipidemia,  diastolic dysfunction, left upper extremity DVT, stroke, stage III chronic kidney disease, sinus node dysfunction status post permanent pacemaker placement in November 2023, paroxysmal atrial fibrillation, prostate cancer, irritable bowel syndrome, and chronic back pain.  In the setting of progressive dyspnea, in December 2022, he underwent diagnostic catheterization revealing severe LAD disease.  He was subsequently referred to CT surgery underwent successful two-vessel bypass in January 2023 with a LIMA to the LAD and a vein graft to the RPDA.  Postop course was notable for paroxysmal atrial fibrillation and concern for tachybradycardia syndrome,  though rate stabilized on a lower dose of beta-blocker.  Subsequent outpatient monitoring in the fall 2023 showed transient bradycardia with rates in the 20s associate with near syncope and pauses up to 3.7 seconds.  He also had an 8% PVC burden.  He was referred to electrophysiology and subsequently underwent dual-chamber permanent pacemaker placement in November 2023.    Mr. Srinivas was last seen in general cardiology clinic in April 2024, at which time he reported intermittent episodes of orthostatic lightheadedness and syncope while working outside.  Amlodipine dose was reduced to 2.5 mg daily.  At electrophysiology follow-up in June 2024, amlodipine was discontinued in the setting of orthostatic symptoms and syncope, though he notes at some point was resumed, and he has been taking.  In late October, he contacted the office due to 3 episodes of near syncope after bending over and then standing up.  He sent in a remote device check which showed sinus rhythm with a 4.2% PVC burden.  Recent device: November 1 that is high ventricular rate detection was reduced to 175 bpm to 150 bpm.  He has continued to have intermittent orthostatic lightheadedness specifically when bending down low and then standing straight up.  He can usually prevent this by standing up more slowly.  He says he remains active though has noticed some reduction in energy levels.  He thinks it may be related to his atorvastatin, which has been dose limited to 20 mg.  He is interested in a statin holiday if possible.  He does not experience chest pain or dyspnea.  He denies palpitations, PND, orthopnea, syncope, edema, or early satiety.  He has longstanding left shoulder pain and tenderness with occasional sharp shooting pain down his arm, especially if he sleeps on that side.  Pain is also worse with rotation of his left shoulder.  He is interested in seeing Ortho for this. Objective  Home Medications    Current Outpatient Medications   Medication Sig Dispense Refill   acetaminophen (TYLENOL) 500 MG tablet Take 500 mg by mouth every 6 (six) hours as needed (for pain.).     amLODipine (NORVASC) 2.5 MG tablet Take 1 tablet (2.5 mg total) by mouth daily. 90 tablet 3   apixaban (ELIQUIS) 2.5 MG TABS tablet Take 1 tablet (2.5 mg total) by mouth 2 (two) times daily. 56 tablet 0   atorvastatin (LIPITOR) 20 MG tablet TAKE ONE (1) TABLET BY MOUTH EVERY DAY 90 tablet 3   Docusate Sodium (COLACE PO) Take by mouth. Takes 2 at night     ezetimibe (ZETIA) 10 MG tablet Take 10 mg by mouth at bedtime.     linaclotide (LINZESS) 145 MCG CAPS capsule Take 1 capsule (145 mcg total) by mouth daily. 30 capsule 5   trimethoprim (TRIMPEX) 100 MG tablet Take 1 tablet (100 mg total) by mouth at bedtime. 30 tablet 11   No current facility-administered medications for this visit.  Physical Exam    VS:  BP 130/60   Pulse 60   Ht 6\' 2"  (1.88 m)   Wt 236 lb 6.4 oz (107.2 kg)   SpO2 96%   BMI 30.35 kg/m  , BMI Body mass index is 30.35 kg/m.       GEN: Well nourished, well developed, in no acute distress. HEENT: normal. Neck: Supple, no JVD, carotid bruits, or masses. Cardiac: RRR, no murmurs, rubs, or gallops. No clubbing, cyanosis, 1+ bilateral ankle edema.  Radials 2+/PT 2+ and equal bilaterally.  Respiratory:  Respirations regular and unlabored, clear to auscultation bilaterally. GI: Soft, nontender, nondistended, BS + x 4. MS: no deformity or atrophy. Skin: warm and dry, no rash. Neuro:  Strength and sensation are intact. Psych: Normal affect.  Accessory Clinical Findings    ECG personally reviewed by me today - EKG Interpretation Date/Time:  Monday February 13 2023 13:25:06 EST Ventricular Rate:  67 PR Interval:  282 QRS Duration:  152 QT Interval:  434 QTC Calculation: 458 R Axis:   -51  Text Interpretation: Atrial-paced rhythm with prolonged AV conduction with occasional Premature ventricular complexes Right bundle branch  block Left anterior fascicular block Bifascicular block Left ventricular hypertrophy with repolarization abnormality ( R in aVL ) Cannot rule out Septal infarct , age undetermined Confirmed by Nicolasa Ducking [id number]) on 02/13/2023 1:29:54 PM  - no acute changes.  Lab Results  Component Value Date   WBC 5.4 03/02/2022   HGB 14.7 03/02/2022   HCT 44.5 03/02/2022   MCV 96 03/02/2022   PLT 180 03/02/2022   Lab Results  Component Value Date   CREATININE 1.74 (H) 03/24/2022   BUN 27 03/24/2022   NA 140 03/24/2022   K 4.5 03/24/2022   CL 100 03/24/2022   CO2 25 03/24/2022   Lab Results  Component Value Date   ALT 28 03/05/2021   AST 29 03/05/2021   ALKPHOS 59 03/05/2021   BILITOT 0.8 03/05/2021   Lab Results  Component Value Date   CHOL 200 11/10/2015   HDL 34 (L) 11/10/2015   LDLCALC 124 11/10/2015   TRIG 208 (H) 11/10/2015   CHOLHDL 5.9 (H) 11/10/2015    Lab Results  Component Value Date   HGBA1C 5.7 (H) 03/05/2021   Lab Results  Component Value Date   TSH 2.862 01/19/2011       Assessment & Plan    1.  Coronary artery disease: Status post two-vessel bypass in January 2023.  He has been doing well without chest pain or dyspnea, though does note reduced energy over the past year or so.  He notes that he had labs earlier this year with his primary care provider in the setting of the symptoms, and they were apparently unremarkable (not available for review).  He is currently on atorvastatin 20 mg daily and Zetia 10 mg daily.  No aspirin in the setting of Eliquis.  He is interested in a statin holiday to see if this helps his energy levels.  We agreed for him to hold his atorvastatin for 2 weeks.  If symptoms improve off of statin, he should contact us so that we can consider alternative.  2.  Primary hypertension: Blood pressure stable on low-dose amlodipine therapy.  3.  Orthostatic lightheadedness: Longstanding issue specifically when squatting low and then standing up.   He can typically manage this by getting up more slowly.  At 1 point, amlodipine was discontinued however, this apparently resulted in elevated blood pressures as  it was subsequently resumed.  Encouraged adequate hydration and vigilance when changing positions.  4.  Hyperlipidemia: Lipids followed by primary care.  He is on relatively low-dose atorvastatin (20 mg), and Zetia 10 mg daily.  He has noted low energy for some time and attributes this to statin therapy.  He is interested in a statin holiday, which we agreed to, as outlined above.  He will contact us in approximate 2 weeks, if symptoms improve off of statin therapy, at which time we will consider an alternate.  5.  Stage III chronic kidney disease: Followed by primary care provider.  6.  Left shoulder and arm pain: This has been going on for several years and is worse with palpation of the left shoulder/biceps, abduction, and rotation of the left shoulder.  He has never seen Ortho and is interested in a referral.  We will arrange.  7.  Paroxysmal atrial fibrillation: This occurred following his CABG.  He is atrial paced and ventricular sensed on ECG today.  He remains anticoagulated with dose adjusted Eliquis in the setting of CKD and age greater than 65.  8.  Sinus node dysfunction: Status post [facility] permanent pacemaker.  Followed closely by electrophysiology.  9.  Disposition: Follow-up in 6 months or sooner if necessary.  Nicolasa Ducking, NP 02/13/2023, 2:03 PM

## 2023-02-13 NOTE — Patient Instructions (Signed)
Medication Instructions:   Hold Lipitor for two weeks and let us know it you stay off of it .   Your physician recommends that you continue on your current medications as directed. Please refer to the Current Medication list given to you today.  *If you need a refill on your cardiac medications before your next appointment, please call your pharmacy*   Lab Work: NONE   If you have labs (blood work) drawn today and your tests are completely normal, you will receive your results only by: MyChart Message (if you have MyChart) OR A paper copy in the mail If you have any lab test that is abnormal or we need to change your treatment, we will call you to review the results.   Testing/Procedures: NONE    Follow-Up: At [facility], you and your health needs are our priority.  As part of our continuing mission to provide you with exceptional heart care, we have created designated Provider Care Teams.  These Care Teams include your primary Cardiologist (physician) and Advanced Practice Providers (APPs -  Physician Assistants and Nurse Practitioners) who all work together to provide you with the care you need, when you need it.  We recommend signing up for the patient portal called "MyChart".  Sign up information is provided on this After Visit Summary.  MyChart is used to connect with patients for Virtual Visits (Telemedicine).  Patients are able to view lab/test results, encounter notes, upcoming appointments, etc.  Non-urgent messages can be sent to your provider as well.   To learn more about what you can do with MyChart, go to [email or web address].    Your next appointment:   6 month(s)  Provider:   You may see Nona Dell, MD or one of the following Advanced Practice Providers on your designated Care Team:   [place], PA-C  Jacolyn Reedy, [place]  .   Other Instructions Thank you for choosing [facility]!

## 2023-02-23 ENCOUNTER — Other Ambulatory Visit: Payer: Self-pay

## 2023-02-23 ENCOUNTER — Other Ambulatory Visit: Payer: PPO

## 2023-02-23 DIAGNOSIS — Z8546 Personal history of malignant neoplasm of prostate: Secondary | ICD-10-CM | POA: Diagnosis not present

## 2023-02-25 LAB — PSA: Prostate Specific Ag, Serum: <0.1 ng/mL (ref 0.0–4.0)

## 2023-02-28 ENCOUNTER — Encounter: Payer: Self-pay | Admitting: Orthopedic Surgery

## 2023-02-28 ENCOUNTER — Ambulatory Visit: Payer: PPO | Admitting: Orthopedic Surgery

## 2023-02-28 VITALS — BP 153/68 | HR 76 | Ht 74.0 in | Wt 239.0 lb

## 2023-02-28 DIAGNOSIS — M25512 Pain in left shoulder: Secondary | ICD-10-CM | POA: Diagnosis not present

## 2023-02-28 DIAGNOSIS — G8929 Other chronic pain: Secondary | ICD-10-CM | POA: Diagnosis not present

## 2023-02-28 NOTE — Patient Instructions (Signed)

## 2023-02-28 NOTE — Progress Notes (Signed)
 New Patient Visit  Assessment: [name] is a 83 y.o. male with the following: Left shoulder pain  Plan: CONN TROMBETTA is a pain in the shoulder for several months.  No specific injury.  He describes burning type pains, which radiate into his hand.  He also has tenderness over the anterior aspect of the shoulder.  He is difficulty with internal rotation in particular.  He is able to lift his arm overhead, but it is painful.  Radiographs from several months ago are without acute injury.  Minimal degenerative changes.  His current pain may be combination of pain in the shoulder, as well as issues emanated from his neck.  This was discussed.  He has had a recent MRI of the cervical spine, and has been followed by spine surgeon.  Nothing was recommended.  Nonetheless, I remain concerned that his ongoing complaints could be partially related to his neck.  Regarding the shoulder, I have offered a steroid injection, which was completed in clinic today.  He will follow-up in 6 weeks for repeat evaluation.  He is to pay attention to how much relief he gets.  Depending on the efficacy, we may have to consider a referral for second opinion for cervical spine.  Procedure note injection Left shoulder    Verbal consent was obtained to inject the left shoulder, subacromial space Timeout was completed to confirm the site of injection.  The skin was prepped with alcohol and ethyl chloride was sprayed at the injection site.  A 21-gauge needle was used to inject 40 mg of Depo-Medrol  and 1% lidocaine  (4 cc) into the subacromial space of the left shoulder using a posterolateral approach.  There were no complications. A sterile bandage was applied.   Follow-up: Return in about 6 weeks (around 04/11/2023).  Subjective:  Chief Complaint  Patient presents with   Shoulder Pain    L shoulder pain going down arm into hand 7-8 mos. Pt states he has recently has had bloodclots in the L arm not sure if that has anything to  do with the pain.     History of Present Illness: [name] is a 83 y.o. male who has been referred by  Lonni Meager, NP for evaluation of left shoulder pain.  He reports he said pain in the left shoulder for several months.  No specific injury.  He does report that he had blood clots in the left arm recently.  He has pain over the anterior aspect of the left shoulder.  He also has pain which radiates from his neck to his hand.  He describes a burning sensation the posterior aspect of the shoulder.  He had an MRI of the cervical spine, which was completed this fall.  He has seen a neurosurgeon, and they recommended consideration for an injection.  He reports no issues with his balance.   Review of Systems: No fevers or chills No numbness or tingling No chest pain No shortness of breath No bowel or bladder dysfunction No GI distress No headaches   Medical History:  Past Medical History:  Diagnosis Date   Arthritis    CAD (coronary artery disease)    Two-vessel obstructive disease November 2022; CABG with LIMA to LAD and SVG to PDA January 2023   Chronic back pain    CKD (chronic kidney disease) stage 3, GFR 30-59 ml/min (HCC)    Diastolic dysfunction    a. 10/2021 Echo: EF 55-60%, no rwma, GrI DD, nl RV fxn.  Mildly dil LA. Triv MR/AI. AoV sclerosis w/o stenosis. Asc Ao 38mm.   Essential hypertension    GERD (gastroesophageal reflux disease)    Helicobacter pylori gastritis    Treated   History of kidney stones    Hx of adenomatous colonic polyps    IBS (irritable bowel syndrome)    PAF (paroxysmal atrial fibrillation) (HCC)    Prostate CA (HCC)    Seed implants   Schatzki's ring    Sinus node dysfunction (HCC)    a. 12/2021 s/p SJM PPM.   Stroke [facility])     Past Surgical History:  Procedure Laterality Date   APPENDECTOMY  age 15   BACK SURGERY  02/08   [facility]   BACK SURGERY  09/24/04   [facility], [facility]   BACK SURGERY  09/18/02   [facility]   BILIARY STENT PLACEMENT   01/07/2012   Procedure: BILIARY STENT PLACEMENT;  Surgeon: Claudis RAYMOND Rivet, MD;  Location: [facility];  Service: Endoscopy;  Laterality: N/A;   BILIARY STENT PLACEMENT  02/21/2012   Procedure: BILIARY STENT PLACEMENT;  Surgeon: Claudis RAYMOND Rivet, MD;  Location: [facility];  Service: Endoscopy;  Laterality: N/A;  Biliary Stent Replacement   CATARACT EXTRACTION W/PHACO  11/22/2010   Procedure: CATARACT EXTRACTION PHACO AND INTRAOCULAR LENS PLACEMENT (IOC);  Surgeon: Dow JULIANNA Burke;  Location: [facility];  Service: Ophthalmology;  Laterality: Right;  CDE: 6.81   CHOLECYSTECTOMY  03/04/05   [facility], [facility]. Gangrenous cholecystitis complicated by abscess requiring percutaneous drainage. He also had common duct stones requiring ERCP and sphincterotomy.   COLONOSCOPY  August 2005   Scattered sigmoid diverticulosis, splenic flexure polyp. Hyperplastic   COLONOSCOPY  1997   3 cm tubular adenoma and a sending colon   COLONOSCOPY  02/01/2011   Rourk-friable anal canal, hyperplastic rectal polyp   COLONOSCOPY N/A 08/15/2013   Procedure: COLONOSCOPY;  Surgeon: Claudis RAYMOND Rivet, MD;  Location: [facility];  Service: Endoscopy;  Laterality: N/A;  100   COLONOSCOPY N/A 09/20/2018   Procedure: COLONOSCOPY;  Surgeon: Rivet Claudis RAYMOND, MD;  Location: [facility];  Service: Endoscopy;  Laterality: N/A;  12:00pm   CORONARY ARTERY BYPASS GRAFT N/A 03/09/2021   Procedure: OFF PUMP CORONARY ARTERY BYPASS GRAFTING (CABG) X2, USING LEFT INTERNAL MAMMARY ARTERY AND RIGHT LEG GREATER SAPHENOUS VEIN HARVESTED ENDOSCOPICALLY;  Surgeon: Shyrl Linnie KIDD, MD;  Location: [facility] OR;  Service: Open Heart Surgery;  Laterality: N/A;  X 2   ERCP  03/02/05   [facility], [facility]. Normal-appearing biliary tree (gallbladder not image), status post sphincterotomy with recovery of small pieces of stone material, status post balloon occlusion cholangiogram.   ERCP  01/07/2012   Procedure: ENDOSCOPIC RETROGRADE CHOLANGIOPANCREATOGRAPHY (ERCP);  Surgeon: Claudis RAYMOND Rivet, MD;  Location: [facility];  Service: Endoscopy;  Laterality: N/A;  possible biliary stenting   ERCP  02/21/2012   Procedure: ENDOSCOPIC RETROGRADE CHOLANGIOPANCREATOGRAPHY (ERCP);  Surgeon: Claudis RAYMOND Rivet, MD;  Location: [facility];  Service: Endoscopy;  Laterality: N/A;  common bile duct stone and clip removed   ERCP N/A 05/18/2012   Procedure: ENDOSCOPIC RETROGRADE CHOLANGIOPANCREATOGRAPHY (ERCP);  Surgeon: Claudis RAYMOND Rivet, MD;  Location: [facility];  Service: Endoscopy;  Laterality: N/A;  stone and debri removal   ESOPHAGOGASTRODUODENOSCOPY  August 2005   Erosive esophagitis and Schatzki ring, small hiatal hernia   ESOPHAGOGASTRODUODENOSCOPY  01/2008   Mild reflux esophagitis, small hiatal hernia   ESOPHAGOGASTRODUODENOSCOPY  02/01/2011   Rourk-erosive reflux esophagitis, small hiatal hernia   ESOPHAGOGASTRODUODENOSCOPY  10/26/2011  Dr. Lisa gastric submucosal petechia (bx-benign ulceration), juxta ampullary duodenal diverticulum and some mucosal edema involving 1st/2nd portion of duodenum with superficial erosions (bx-superficial ulceration/benign)   ESOPHAGOGASTRODUODENOSCOPY  02/21/2012   Procedure: ESOPHAGOGASTRODUODENOSCOPY (EGD);  Surgeon: Claudis RAYMOND Rivet, MD;  Location: [facility];  Service: Endoscopy;  Laterality: N/A;   EYE SURGERY     Incomplete colonoscopy  December 2009   Left-sided diverticula, mid descending colon polyp, due to recurrent looping and redundancy exam was incomplete. It was felt that the mid colon was reached. Followup barium enema showed colon interposition between the liver and the diaphragm, redundant sigmoid colon but no colon mass or polyp identified.. Pathology revealed tubular adenoma.   LEFT HEART CATH AND CORONARY ANGIOGRAPHY N/A 01/18/2021   Procedure: LEFT HEART CATH AND CORONARY ANGIOGRAPHY;  Surgeon: Court Dorn PARAS, MD;  [facility];  Service: Cardiovascular;  Laterality: N/A;   PACEMAKER IMPLANT N/A 12/22/2021   Procedure:  PACEMAKER IMPLANT;  Surgeon: Waddell Danelle ORN, MD;  Location: [facility];  Service: Cardiovascular;  Laterality: N/A;   POLYPECTOMY  09/20/2018   Procedure: POLYPECTOMY;  Surgeon: Rivet Claudis RAYMOND, MD;  [facility];  Service: Endoscopy;;  Hepatic flexure polyp cold snare, splenic flexure polyp   SPHINCTEROTOMY  01/07/2012   Procedure: SPHINCTEROTOMY;  Surgeon: Claudis RAYMOND Rivet, MD;  Location: [facility];  Service: Endoscopy;  Laterality: N/A;  Extended   SPYGLASS CHOLANGIOSCOPY  02/21/2012   Procedure: SPYGLASS CHOLANGIOSCOPY;  Surgeon: Claudis RAYMOND Rivet, MD;  Location: [facility];  Service: Endoscopy;  Laterality: N/A;   SPYGLASS CHOLANGIOSCOPY N/A 05/18/2012   Procedure: SPYGLASS CHOLANGIOSCOPY;  Surgeon: Claudis RAYMOND Rivet, MD;  Location: [facility];  Service: Endoscopy;  Laterality: N/A;   TEE WITHOUT CARDIOVERSION N/A 03/09/2021   Procedure: TRANSESOPHAGEAL ECHOCARDIOGRAM (TEE);  Surgeon: Shyrl Linnie KIDD, MD;  Location: [facility] OR;  Service: Open Heart Surgery;  Laterality: N/A;   TOTAL KNEE ARTHROPLASTY  03/21/2012   Procedure: TOTAL KNEE ARTHROPLASTY;  Surgeon: Dempsey PARAS Sensor, MD;  Location: [facility];  Service: Orthopedics;  Laterality: Right;  right knee arthroplasty   TOTAL KNEE ARTHROPLASTY Left 04/08/2019   Procedure: LEFT TOTAL KNEE ARTHROPLASTY;  Surgeon: Sensor Dempsey, MD;  Location: [facility];  Service: Orthopedics;  Laterality: Left;    Family History  Problem Relation Age of Onset   Diabetes Mother    Coronary artery disease Mother    Coronary artery disease Father    Diabetes Father    Stomach cancer Father    Deep vein thrombosis Daughter    Hypotension Neg Hx    Anesthesia problems Neg Hx    Malignant hyperthermia Neg Hx    Pseudochol deficiency Neg Hx    Colon cancer Neg Hx    Social History   Tobacco Use   Smoking status: Never   Smokeless tobacco: Never  Vaping Use   Vaping status: Never Used  Substance Use Topics   Alcohol use: No    Alcohol/week: 0.0 standard  drinks of alcohol   Drug use: No    No Known Allergies  Current Meds  Medication Sig   amLODipine  (NORVASC ) 2.5 MG tablet Take 1 tablet (2.5 mg total) by mouth daily.   apixaban  (ELIQUIS ) 2.5 MG TABS tablet Take 1 tablet (2.5 mg total) by mouth 2 (two) times daily.   atorvastatin  (LIPITOR) 20 MG tablet TAKE ONE (1) TABLET BY MOUTH EVERY DAY    Objective: BP (!) 153/68   Pulse 76   Ht 6'  2 (1.88 m)   Wt 239 lb (108.4 kg)   BMI 30.69 kg/m   Physical Exam:  General: Alert and oriented. and No acute distress. Gait: Normal gait.  Evaluation left shoulder demonstrates no swelling.  No redness.  He is tenderness to palpation over the biceps tendon.  140 degrees of forward flexion.  Abduction to 100 degrees.  Internal rotation of the side of his hip.  Positive Jobe's.  Negative belly press.  Negative Hoffmann bilaterally.  IMAGING: I personally reviewed images previously obtained in clinic  X-rays left shoulder were previously obtained.  Negative for acute injury or chronic degenerative changes.   New Medications:  No orders of the defined types were placed in this encounter.     [name], MD  02/28/2023 1:40 PM

## 2023-03-02 ENCOUNTER — Telehealth: Payer: Self-pay | Admitting: Cardiology

## 2023-03-02 ENCOUNTER — Ambulatory Visit: Payer: PPO | Admitting: Urology

## 2023-03-02 MED ORDER — APIXABAN 2.5 MG PO TABS: 2.5000 mg | ORAL_TABLET | Freq: Two times a day (BID) | ORAL | 1 refills | Status: DC

## 2023-03-02 NOTE — Telephone Encounter (Signed)
 Pt has 9 pills left

## 2023-03-02 NOTE — Telephone Encounter (Signed)
 Patient needs refill on eliquis. He said his insurance told him it could be cheaper if he got 200 pills instead of the 60.

## 2023-03-02 NOTE — Telephone Encounter (Signed)
 Prescription refill request for Eliquis received. Indication: DVT Last office visit: 02/13/23  Flavia Shipper NP Scr: 1.74 on 03/24/22  Epic Age: 83 Weight: 107.2kg  Based on above findings Eliquis 2.5mg  twice daily is the appropriate dose.  Refill approved.

## 2023-03-09 ENCOUNTER — Ambulatory Visit: Payer: PPO | Admitting: Urology

## 2023-03-09 VITALS — BP 168/69 | HR 60

## 2023-03-09 DIAGNOSIS — N3281 Overactive bladder: Secondary | ICD-10-CM

## 2023-03-09 DIAGNOSIS — R3915 Urgency of urination: Secondary | ICD-10-CM

## 2023-03-09 DIAGNOSIS — N99112 Postprocedural membranous urethral stricture: Secondary | ICD-10-CM

## 2023-03-09 DIAGNOSIS — Z87440 Personal history of urinary (tract) infections: Secondary | ICD-10-CM | POA: Diagnosis not present

## 2023-03-09 DIAGNOSIS — N3941 Urge incontinence: Secondary | ICD-10-CM

## 2023-03-09 DIAGNOSIS — Z8546 Personal history of malignant neoplasm of prostate: Secondary | ICD-10-CM | POA: Diagnosis not present

## 2023-03-09 DIAGNOSIS — N3020 Other chronic cystitis without hematuria: Secondary | ICD-10-CM

## 2023-03-09 LAB — URINALYSIS, ROUTINE W REFLEX MICROSCOPIC
Bilirubin, UA: NEGATIVE
Glucose, UA: NEGATIVE
Ketones, UA: NEGATIVE
Nitrite, UA: NEGATIVE
Protein,UA: NEGATIVE
RBC, UA: NEGATIVE
Specific Gravity, UA: 1.025 (ref 1.005–1.030)
Urobilinogen, Ur: 0.2 mg/dL (ref 0.2–1.0)
pH, UA: 6 (ref 5.0–7.5)

## 2023-03-09 LAB — MICROSCOPIC EXAMINATION

## 2023-03-09 MED ORDER — GEMTESA 75 MG PO TABS: 1.0000 | ORAL_TABLET | Freq: Every day | ORAL | 0 refills | Status: DC

## 2023-03-09 NOTE — Progress Notes (Signed)
Subjective:  1. History of prostate cancer   2. Chronic cystitis   3. Postprocedural membranous urethral stricture   4. Urge incontinence   5. Overactive bladder     01/31/20: [name] returns today in f/u from recent imaging.  He had a CT for the abdominal pain he had at his last visit and was found to have some small but slightly larger left iliac adenopathy.  He then had a PSMA PET that showed mild uptake in the nodes and this is felt to be either indolent nodal metastases vs slightly enlarged benign nodes.  His PSA remains <0.1 in 10/21.  He has continue to have some abdominal discomfort and has seen GI but a cause has not been clearly determined.  He has chronic constipation.    He is having SUI and uses paper towel.  He has urgency with UUI that has progressed.  He has nocturia x 3.   He had previously been on Oxybutynin and he was given samples of Gemtesa which helped a little bit.  He had UDS in the past that showed a bladder with some instability.   1/5/23[name] returns today in f/u.  His PSA prior to this visit remains undetectible.  A repeat CT [facility] on 01/26/21 showed no further adenopathy or other concerning findings.  He is going to have a CABG on 03/09/21 by Dr. Cliffton Asters.  He had been having DOE.  He has persistent MUI.  He didn't get much benefit from oxybutynin or myrbetriq.  He is using a paper towel in the underwear and has moderate nocturia.  He has had no hematuria.  He had a seed implant remotely.    1/4/24[name] returns today in f/u. His PSA remains <0.1.  Chest CT in 7/23 showed no concerning findings.  He has a history of MUI with DI. He had a CABG in 1/23. He remains on Eliquis. He had to have a pacemaker in November.  He has stable LUTS with an IPSS of 20 with nocturia 4x and some intermittency and dribbling. He has some MUI.  He has occ dysuria but no hematuria. He had a DVT in the left arm and the Eliquis dose was increased.  His UA has >30 WBC, 3-10 RBC and moderate  bacteria.  He has CKD 3b with a Cr of 1.73 in 10/23.  2/1/24[name] returns today in f/u following treatment of a UTI.  His culture grew serratia and he was switched from keflex to bactrim.   He has the occasional sharp pain in the penis.  He has persistent frequency and nocturia x 4.  He has some urgency with occasional UUI.  He has no hematuria. His Cr on 03/02/22 was 1.85.  He got a little better with the bactrim but his UA looks infected today.  He has a single stone remotely.    3/28/24[name] returns today in f/u from a CT scan for cystoscopy.  The CT showed no obvious cause of the UTI's or hematuria. There was a punctate renal stone.  He remains on TMP for suppression.  He has 3-10 RBC's on UA today.  His PSA was <0.1 on 03/24/22. He had some stable enlarged lymph nodes and borderline dilation of the pulmonary trunk on the CT as well.   10/24/24[name] returns today in f/u.  He had been on TMP suppression but ran out about a month ago.  He had the onset yesterday of dysuria and it has worsened today.  His UA had 11-30 WBC and 3-10 RBC's with many bacteria.   He has had no fever or chills.  He has increased frequency and urgency.  He can have UUI.  He has a variable stream.   HIs IPSS is 28 with nocturia x 4.   12/29/22: [name] returns today in f/u.   He has been off of the bactrim for a week and his UA is clear.  His symptoms are somewhat better.  He still has nocturia up to 4x.    1/16/25[name] returns today in f/u.  His PSA remains undetectible on 02/23/23.  His UA has 6-10 WBC's with few bacteria.  He remains on TMP or suppression.  His IPSS is 14 with nocturia x 3 and urgency.        IPSS     Row Name 03/09/23 1400         International Prostate Symptom Score   How often have you had the sensation of not emptying your bladder? Less than half the time     How often have you had to urinate less than every two hours? More than half the time     How often have you found you stopped and started again  several times when you urinated? Not at All     How often have you found it difficult to postpone urination? Almost always     How often have you had a weak urinary stream? Not at All     How often have you had to strain to start urination? Not at All     How many times did you typically get up at night to urinate? 3 Times     Total IPSS Score 14       Quality of Life due to urinary symptoms   If you were to spend the rest of your life with your urinary condition just the way it is now how would you feel about that? Unhappy                 ROS:  ROS:  A complete review of systems was performed.  All systems are negative except for pertinent findings as noted.   Review of Systems  Constitutional:  Negative for chills and fever.  Genitourinary:  Positive for frequency and urgency.  Musculoskeletal:  Positive for joint pain.    No Known Allergies  Outpatient Encounter Medications as of 03/09/2023  Medication Sig   acetaminophen (TYLENOL) 500 MG tablet Take 500 mg by mouth every 6 (six) hours as needed (for pain.).   apixaban (ELIQUIS) 2.5 MG TABS tablet Take 1 tablet (2.5 mg total) by mouth 2 (two) times daily.   atorvastatin (LIPITOR) 20 MG tablet TAKE ONE (1) TABLET BY MOUTH EVERY DAY   Docusate Sodium (COLACE PO) Take by mouth. Takes 2 at night   ezetimibe (ZETIA) 10 MG tablet Take 10 mg by mouth at bedtime.   linaclotide (LINZESS) 145 MCG CAPS capsule Take 1 capsule (145 mcg total) by mouth daily.   trimethoprim (TRIMPEX) 100 MG tablet Take 1 tablet (100 mg total) by mouth at bedtime.   Vibegron (GEMTESA) 75 MG TABS Take 1 tablet (75 mg total) by mouth daily.   amLODipine (NORVASC) 2.5 MG tablet Take 1 tablet (2.5 mg total) by mouth daily.   No facility-administered encounter medications on file as of 03/09/2023.    Past Medical History:  Diagnosis Date   Arthritis    CAD (coronary artery disease)  Two-vessel obstructive disease November 2022; CABG with LIMA to LAD  and SVG to PDA January 2023   Chronic back pain    CKD (chronic kidney disease) stage 3, GFR 30-59 ml/min (HCC)    Diastolic dysfunction    a. 10/2021 Echo: EF 55-60%, no rwma, GrI DD, nl RV fxn. Mildly dil LA. Triv MR/AI. AoV sclerosis w/o stenosis. Asc Ao 38mm.   Essential hypertension    GERD (gastroesophageal reflux disease)    Helicobacter pylori gastritis    Treated   History of kidney stones    Hx of adenomatous colonic polyps    IBS (irritable bowel syndrome)    PAF (paroxysmal atrial fibrillation) (HCC)    Prostate CA (HCC)    Seed implants   Schatzki's ring    Sinus node dysfunction (HCC)    a. 12/2021 s/p SJM PPM.   Stroke [facility])     Past Surgical History:  Procedure Laterality Date   APPENDECTOMY  age 59   BACK SURGERY  02/08   [facility]   BACK SURGERY  09/24/04   lumbar, [facility]   BACK SURGERY  09/18/02   [facility]   BILIARY STENT PLACEMENT  01/07/2012   Procedure: BILIARY STENT PLACEMENT;  Surgeon: Malissa Hippo, MD;  Location: [facility] ORS;  Service: Endoscopy;  Laterality: N/A;   BILIARY STENT PLACEMENT  02/21/2012   Procedure: BILIARY STENT PLACEMENT;  Surgeon: Malissa Hippo, MD;  Location: [facility] ORS;  Service: Endoscopy;  Laterality: N/A;  Biliary Stent Replacement   CATARACT EXTRACTION W/PHACO  11/22/2010   Procedure: CATARACT EXTRACTION PHACO AND INTRAOCULAR LENS PLACEMENT (IOC);  Surgeon: Susa Simmonds;  Location: [facility] ORS;  Service: Ophthalmology;  Laterality: Right;  CDE: 6.81   CHOLECYSTECTOMY  03/04/05   [facility], [facility]. Gangrenous cholecystitis complicated by abscess requiring percutaneous drainage. He also had common duct stones requiring ERCP and sphincterotomy.   COLONOSCOPY  August 2005   Scattered sigmoid diverticulosis, splenic flexure polyp. Hyperplastic   COLONOSCOPY  1997   3 cm tubular adenoma and a sending colon   COLONOSCOPY  02/01/2011   Rourk-friable anal canal, hyperplastic rectal polyp   COLONOSCOPY N/A 08/15/2013   Procedure: COLONOSCOPY;  Surgeon: Malissa Hippo, MD;  Location: [facility] END[facility] SU[facility];  Service: Endoscopy;  Laterality: N/A;  100   COLONOSCOPY N/A 09/20/2018   Procedure: COLONOSCOPY;  Surgeon: Malissa Hippo, MD;  Location: [facility];  Service: Endoscopy;  Laterality: N/A;  12:00pm   CORONARY ARTERY BYPASS GRAFT N/A 03/09/2021   Procedure: OFF PUMP CORONARY ARTERY BYPASS GRAFTING (CABG) X2, USING LEFT INTERNAL MAMMARY ARTERY AND RIGHT LEG GREATER SAPHENOUS VEIN HARVESTED ENDOSCOPICALLY;  Surgeon: Corliss Skains, MD;  Location: [facility] OR;  Service: Open Heart Surgery;  Laterality: N/A;  X 2   ERCP  03/02/05   [facility], [facility]. Normal-appearing biliary tree (gallbladder not image), status post sphincterotomy with recovery of small pieces of stone material, status post balloon occlusion cholangiogram.   ERCP  01/07/2012   Procedure: ENDOSCOPIC RETROGRADE CHOLANGIOPANCREATOGRAPHY (ERCP);  Surgeon: Malissa Hippo, MD;  Location: [facility] ORS;  Service: Endoscopy;  Laterality: N/A;  possible biliary stenting   ERCP  02/21/2012   Procedure: ENDOSCOPIC RETROGRADE CHOLANGIOPANCREATOGRAPHY (ERCP);  Surgeon: Malissa Hippo, MD;  Location: [facility];  Service: Endoscopy;  Laterality: N/A;  common bile duct stone and clip removed   ERCP N/A 05/18/2012   Procedure: ENDOSCOPIC RETROGRADE CHOLANGIOPANCREATOGRAPHY (ERCP);  Surgeon: Malissa Hippo, MD;  Location: [facility];  Service: Endoscopy;  Laterality:  N/A;  stone and debri removal   ESOPHAGOGASTRODUODENOSCOPY  August 2005   Erosive esophagitis and Schatzki ring, small hiatal hernia   ESOPHAGOGASTRODUODENOSCOPY  01/2008   Mild reflux esophagitis, small hiatal hernia   ESOPHAGOGASTRODUODENOSCOPY  02/01/2011   Rourk-erosive reflux esophagitis, small hiatal hernia   ESOPHAGOGASTRODUODENOSCOPY  10/26/2011   Dr. Kerby Moors gastric submucosal petechia (bx-benign ulceration), juxta ampullary duodenal diverticulum and some mucosal edema involving 1st/2nd portion of duodenum with superficial erosions (bx-superficial  ulceration/benign)   ESOPHAGOGASTRODUODENOSCOPY  02/21/2012   Procedure: ESOPHAGOGASTRODUODENOSCOPY (EGD);  Surgeon: Malissa Hippo, MD;  Location: [facility];  Service: Endoscopy;  Laterality: N/A;   EYE SURGERY     Incomplete colonoscopy  December 2009   Left-sided diverticula, mid descending colon polyp, due to recurrent looping and redundancy exam was incomplete. It was felt that the mid colon was reached. Followup barium enema showed colon interposition between the liver and the diaphragm, redundant sigmoid colon but no colon mass or polyp identified.. Pathology revealed tubular adenoma.   LEFT HEART CATH AND CORONARY ANGIOGRAPHY N/A 01/18/2021   Procedure: LEFT HEART CATH AND CORONARY ANGIOGRAPHY;  Surgeon: Runell Gess, MD;  [facility] INVAS[facility] LAB;  Service: Cardiovascular;  Laterality: N/A;   PACEMAKER IMPLANT N/A 12/22/2021   Procedure: PACEMAKER IMPLANT;  Surgeon: Marinus Maw, MD;  Location: [facility];  Service: Cardiovascular;  Laterality: N/A;   POLYPECTOMY  09/20/2018   Procedure: POLYPECTOMY;  Surgeon: Malissa Hippo, MD;  [facility];  Service: Endoscopy;;  Hepatic flexure polyp cold snare, splenic flexure polyp   SPHINCTEROTOMY  01/07/2012   Procedure: SPHINCTEROTOMY;  Surgeon: Malissa Hippo, MD;  Location: [facility];  Service: Endoscopy;  Laterality: N/A;  Extended   SPYGLASS CHOLANGIOSCOPY  02/21/2012   Procedure: SPYGLASS CHOLANGIOSCOPY;  Surgeon: Malissa Hippo, MD;  Location: [facility];  Service: Endoscopy;  Laterality: N/A;   SPYGLASS CHOLANGIOSCOPY N/A 05/18/2012   Procedure: SPYGLASS CHOLANGIOSCOPY;  Surgeon: Malissa Hippo, MD;  Location: [facility];  Service: Endoscopy;  Laterality: N/A;   TEE WITHOUT CARDIOVERSION N/A 03/09/2021   Procedure: TRANSESOPHAGEAL ECHOCARDIOGRAM (TEE);  Surgeon: Corliss Skains, MD;  Location: [facility];  Service: Open Heart Surgery;  Laterality: N/A;   TOTAL KNEE ARTHROPLASTY  03/21/2012   Procedure: TOTAL KNEE  ARTHROPLASTY;  Surgeon: [name], MD;  Location: [facility] OR;  Service: Orthopedics;  Laterality: Right;  right knee arthroplasty   TOTAL KNEE ARTHROPLASTY Left 04/08/2019   Procedure: LEFT TOTAL KNEE ARTHROPLASTY;  Surgeon: Gean Birchwood, MD;  Location: [facility] ORS;  Service: Orthopedics;  Laterality: Left;    Social History   Socioeconomic History   Marital status: Married    Spouse name: Not on file   Number of children: 2   Years of education: Not on file   Highest education level: Not on file  Occupational History   Occupation: Retired Careers information officer: RETIRED  Tobacco Use   Smoking status: Never   Smokeless tobacco: Never  Vaping Use   Vaping status: Never Used  Substance and Sexual Activity   Alcohol use: No    Alcohol/week: 0.0 standard drinks of alcohol   Drug use: No   Sexual activity: Not on file  Other Topics Concern   Not on file  Social History Narrative   Not on file   Social Drivers of Health   Financial Resource Strain: Not on file  Food Insecurity: Not on file  Transportation Needs: Not on file  Physical Activity: Not on file  Stress: Not on file  Social Connections: Unknown (10/12/2022)   [facility]   Social Network    Social Network: Not on file  Intimate Partner Violence: Unknown (10/12/2022)   Received from [facility]   HITS    Physically Hurt: Not on file    Insult or Talk Down To: Not on file    Threaten Physical Harm: Not on file    Scream or Curse: Not on file    Family History  Problem Relation Age of Onset   Diabetes Mother    Coronary artery disease Mother    Coronary artery disease Father    Diabetes Father    Stomach cancer Father    Deep vein thrombosis Daughter    Hypotension Neg Hx    Anesthesia problems Neg Hx    Malignant hyperthermia Neg Hx    Pseudochol deficiency Neg Hx    Colon cancer Neg Hx        Objective: Vitals:   03/09/23 1429  BP: (!) 168/69  Pulse: 60     Physical  Exam Vitals reviewed.  Constitutional:      Appearance: Normal appearance.  Neurological:     Mental Status: He is alert.     Lab Results:  Results for orders placed or performed in visit on 03/09/23 (from the past 24 hours)  Urinalysis, Routine w reflex microscopic     Status: Abnormal   Collection Time: 03/09/23  2:29 PM  Result Value Ref Range   Specific Gravity, UA 1.025 1.005 - 1.030   pH, UA 6.0 5.0 - 7.5   Color, UA Yellow Yellow   Appearance Ur Clear Clear   Leukocytes,UA Trace (A) Negative   Protein,UA Negative Negative/Trace   Glucose, UA Negative Negative   Ketones, UA Negative Negative   RBC, UA Negative Negative   Bilirubin, UA Negative Negative   Urobilinogen, Ur 0.2 0.2 - 1.0 mg/dL   Nitrite, UA Negative Negative   Microscopic Examination See below:    Narrative   Performed at:  [address] - [facility] [address], [place], [place]  [id number] Lab Director: Chinita Pester MT, Phone:  [phone]  Microscopic Examination     Status: Abnormal   Collection Time: 03/09/23  2:29 PM   Urine  Result Value Ref Range   WBC, UA 6-10 (A) 0 - 5 /hpf   RBC, Urine 0-2 0 - 2 /hpf   Epithelial Cells (non renal) 0-10 0 - 10 /hpf   Bacteria, UA Few (A) None seen/Few   Narrative   Performed at:  [address] - [facility] [address], [place], [place]  [id number] Lab Director: Chinita Pester MT, Phone:  [phone]         BMET No results for input(s): "NA", "K", "CL", "CO2", "GLUCOSE", "BUN", "CREATININE", "CALCIUM" in the last 72 hours. PSA <0.1 in 9/20. <0.1 in 10/21 <0.1 in 12/22 <0.1 in 12/23 UA reviewed.  Clear Culture grew proteus.   Lab Results  Component Value Date   PSA1 <0.1 02/23/2023   PSA1 <0.1 03/24/2022   PSA1 <0.1 02/17/2022    Studies/Results: CUP PACEART REMOTE DEVICE CHECK Result Date: 12/26/2022 Scheduled remote reviewed. Normal device function.  Next remote 91 days. LA, CVRS  CUP PACEART INCLINIC DEVICE CHECK Result Date:  12/23/2022 Patient seen in device clinic to decrease HVR detection rate from 175 bpm to 150 bpm per Dr. Ladona Ridgel. See EPIC note 12/22/22.Sharia Reeve, BSN, RN  DG  Foot Complete Left Result Date: 12/13/2022 CLINICAL DATA:  Fall with pain, unable to put weight on left foot. EXAM: LEFT FOOT - COMPLETE 3+ VIEW COMPARISON:  None Available. FINDINGS: A tiny bony density is present along the dorsal aspect of the middle phalanx of the second digit. Hammertoe deformity is noted at the second digit with hyperflexion of the proximal interphalangeal joint. There is hallux valgus deformity at the first metatarsophalangeal joint with degenerative changes. There is midfoot collapse with degenerative changes. A large calcaneal spur is noted. Soft tissue swelling is present about the ankle and over the dorsum of the foot. IMPRESSION: 1. Bony density along the dorsal aspect of the base of the middle phalanx of the second digit, possible avulsion fracture of indeterminate age. Examination of the second digit is limited due to hammertoe deformity. 2. Hallux valgus deformity and degenerative changes at the first metatarsophalangeal joint. 3. Scattered degenerative changes in the midfoot and hindfoot. Electronically Signed   By: Thornell Sartorius M.D.   On: 12/13/2022 21:46     Assessment & Plan: Recurrent UTI.  He had proteus which has cleared.  I will keep him on TMP.  I will have him return in 3 months.    Membranous stricture from radiation.  He has a variable stream.   History of prostate cancer.  PSA is undetectible.  Urgency.  I will have him try Gemtesa again and will give samples.  He will call for a script.    History of stones.  He had only a punctate stone on his last CT. He has had no hematuria or flank pain.  Meds ordered this encounter  Medications   Vibegron (GEMTESA) 75 MG TABS    Sig: Take 1 tablet (75 mg total) by mouth daily.    Dispense:  42 tablet    Refill:  0     Orders Placed This Encounter   Procedures   Microscopic Examination   Urinalysis, Routine w reflex microscopic      Return in about 3 months (around 06/07/2023).   CC: Benita Stabile, MD      Bjorn Pippin 03/10/2023 Patient ID: [name], male   DOB: 1940/09/13, 83 y.o.   MRN: [id number]

## 2023-03-27 ENCOUNTER — Ambulatory Visit: Payer: PPO

## 2023-03-27 DIAGNOSIS — I495 Sick sinus syndrome: Secondary | ICD-10-CM

## 2023-03-27 LAB — CUP PACEART REMOTE DEVICE CHECK
Battery Remaining Longevity: 110 mo
Battery Remaining Percentage: 89 %
Battery Voltage: 3.01 V
Brady Statistic AP VP Percent: 1 %
Brady Statistic AP VS Percent: 78 %
Brady Statistic AS VP Percent: 1 %
Brady Statistic AS VS Percent: 17 %
Brady Statistic RA Percent Paced: 72 %
Brady Statistic RV Percent Paced: 1.2 %
Date Time Interrogation Session: 20250203022939
Implantable Lead Connection Status: 753985
Implantable Lead Connection Status: 753985
Implantable Lead Implant Date: 20231101
Implantable Lead Implant Date: 20231101
Implantable Lead Location: 753859
Implantable Lead Location: 753860
Implantable Pulse Generator Implant Date: 20231101
Lead Channel Impedance Value: 400 Ohm
Lead Channel Impedance Value: 480 Ohm
Lead Channel Pacing Threshold Amplitude: 0.75 V
Lead Channel Pacing Threshold Amplitude: 0.75 V
Lead Channel Pacing Threshold Pulse Width: 0.5 ms
Lead Channel Pacing Threshold Pulse Width: 0.5 ms
Lead Channel Sensing Intrinsic Amplitude: 12 mV
Lead Channel Sensing Intrinsic Amplitude: 5 mV
Lead Channel Setting Pacing Amplitude: 1 V
Lead Channel Setting Pacing Amplitude: 1.75 V
Lead Channel Setting Pacing Pulse Width: 0.5 ms
Lead Channel Setting Sensing Sensitivity: 2 mV
Pulse Gen Model: 2272
Pulse Gen Serial Number: 8115159

## 2023-04-11 ENCOUNTER — Ambulatory Visit: Payer: PPO | Admitting: Orthopedic Surgery

## 2023-04-11 ENCOUNTER — Encounter: Payer: Self-pay | Admitting: Orthopedic Surgery

## 2023-04-11 DIAGNOSIS — M25512 Pain in left shoulder: Secondary | ICD-10-CM | POA: Diagnosis not present

## 2023-04-11 DIAGNOSIS — G8929 Other chronic pain: Secondary | ICD-10-CM

## 2023-04-11 NOTE — Progress Notes (Signed)
Return patient Visit  Assessment: [name] is a 83 y.o. male with the following: Left shoulder pain  Plan: JAYTHAN HINELY continues to have issues in the left shoulder.  Prior injection was not effective.  He had very little relief.  On physical exam, pain is recreated with strength testing of the supraspinatus and infraspinatus.  He is unable to lay on his left side.  He is overall frustrated.  I think it is reasonable to proceed with an MRI.  This was ordered.  He also describes shooting pains rating into the left hand, with some associated numbness and tingling.  There could be a component of this associated with cervical spine pathology.  Once the MRI has been obtained, we will be able to reassess in more detail.   Follow-up: Return for After MRI.  Subjective:  Chief Complaint  Patient presents with   Shoulder Pain    History of Present Illness: [name] is a 83 y.o. male who returns to clinic for repeat evaluation of left shoulder pain.  I have seen him in clinic previously.  I injected his left shoulder approximately 6 weeks ago.  He states he had very little relief, if any, following that injection.  He continues to have pain pain is over the anterior and lateral aspect of the left shoulder.  He is unable to lay on his left side.  He cannot lay on that side at nighttime.  Pain gets worse at night.  Occasionally, he will have radiating pains in the left hand.  This is also associated with some numbness and tingling in the left hand.   Review of Systems: No fevers or chills + numbness or tingling No chest pain No shortness of breath No bowel or bladder dysfunction No GI distress No headaches    Objective: There were no vitals taken for this visit.  Physical Exam:  General: Alert and oriented. and No acute distress. Gait: Normal gait.  Left shoulder without swelling.  No redness.  Tenderness palpation over the anterior shoulder and bicipital groove.  Mild tenderness  over the lateral shoulder.  Positive Jobe's.  Positive drop arm test.  Positive O'Brien's.  Fingers are warm and well-perfused.  Sensation intact throughout the left hand.  . IMAGING: I personally reviewed images previously obtained in clinic  X-rays left shoulder were previously obtained.  Negative for acute injury or chronic degenerative changes.   New Medications:  No orders of the defined types were placed in this encounter.     Oliver Barre, MD  04/11/2023 2:50 PM

## 2023-04-25 ENCOUNTER — Ambulatory Visit (HOSPITAL_COMMUNITY)
Admission: RE | Admit: 2023-04-25 | Discharge: 2023-04-25 | Disposition: A | Discharge status: Home / Self Care | Payer: PPO | Source: Ambulatory Visit | Attending: Orthopedic Surgery | Admitting: Orthopedic Surgery

## 2023-04-25 DIAGNOSIS — M25512 Pain in left shoulder: Secondary | ICD-10-CM | POA: Diagnosis not present

## 2023-04-25 DIAGNOSIS — M75122 Complete rotator cuff tear or rupture of left shoulder, not specified as traumatic: Secondary | ICD-10-CM | POA: Diagnosis not present

## 2023-04-25 DIAGNOSIS — M129 Arthropathy, unspecified: Secondary | ICD-10-CM | POA: Diagnosis not present

## 2023-04-25 DIAGNOSIS — G8929 Other chronic pain: Secondary | ICD-10-CM | POA: Diagnosis not present

## 2023-04-25 DIAGNOSIS — M7582 Other shoulder lesions, left shoulder: Secondary | ICD-10-CM | POA: Diagnosis not present

## 2023-04-25 DIAGNOSIS — M25412 Effusion, left shoulder: Secondary | ICD-10-CM | POA: Diagnosis not present

## 2023-04-28 DIAGNOSIS — Z8546 Personal history of malignant neoplasm of prostate: Secondary | ICD-10-CM | POA: Diagnosis not present

## 2023-04-28 DIAGNOSIS — I129 Hypertensive chronic kidney disease with stage 1 through stage 4 chronic kidney disease, or unspecified chronic kidney disease: Secondary | ICD-10-CM | POA: Diagnosis not present

## 2023-04-28 DIAGNOSIS — E039 Hypothyroidism, unspecified: Secondary | ICD-10-CM | POA: Diagnosis not present

## 2023-04-28 DIAGNOSIS — R7303 Prediabetes: Secondary | ICD-10-CM | POA: Diagnosis not present

## 2023-05-01 ENCOUNTER — Ambulatory Visit
Admission: EM | Admit: 2023-05-01 | Discharge: 2023-05-01 | Disposition: A | Discharge status: Home / Self Care | Attending: Nurse Practitioner | Admitting: Nurse Practitioner

## 2023-05-01 DIAGNOSIS — J101 Influenza due to other identified influenza virus with other respiratory manifestations: Secondary | ICD-10-CM

## 2023-05-01 LAB — POC COVID19/FLU A&B COMBO
Covid Antigen, POC: NEGATIVE
Influenza A Antigen, POC: POSITIVE — AB
Influenza B Antigen, POC: NEGATIVE

## 2023-05-01 MED ORDER — OSELTAMIVIR PHOSPHATE 75 MG PO CAPS: 75.0000 mg | ORAL_CAPSULE | Freq: Two times a day (BID) | ORAL | 0 refills | Status: DC

## 2023-05-01 NOTE — Discharge Instructions (Addendum)
 You have tested positive for influenza A. May take over-the-counter Tylenol as needed for pain, fever, or general discomfort. Continue to drink plenty of fluids.  Recommend the use of Pedialyte or Gatorade to prevent dehydration. Recommend using a humidifier in the bedroom at nighttime during sleep and having her sleep elevated on pillows while cough symptoms persist. If you develop a fever, you should remain home until you have been fever free for 24 hours with no medication. Please be advised that symptoms should improve over the next 5 to 7 days.  If symptoms fail to improve or appear to be worsening, you may follow-up in this clinic or with your primary care physician for further evaluation. Follow-up as needed.

## 2023-05-01 NOTE — ED Provider Notes (Signed)
 [facility]    CSN: [id number] Arrival date & time: 05/01/23  1157      History   Chief Complaint No chief complaint on file.   HPI [name] is a 83 y.o. male.   The history is provided by the patient.   Patient presents with a 2-day history of head congestion, nasal congestion, sore throat, and a mild cough.  Patient denies fever, chills, body aches, ear pain, wheezing, difficulty breathing, chest pain, abdominal pain, nausea, vomiting, diarrhea, or rash.  Patient states he has been taking over-the-counter Coricidin for symptoms and using Flonase.  Denies any obvious known sick contacts.  Past Medical History:  Diagnosis Date   Arthritis    CAD (coronary artery disease)    Two-vessel obstructive disease November 2022; CABG with LIMA to LAD and SVG to PDA January 2023   Chronic back pain    CKD (chronic kidney disease) stage 3, GFR 30-59 ml/min (HCC)    Diastolic dysfunction    a. 10/2021 Echo: EF 55-60%, no rwma, GrI DD, nl RV fxn. Mildly dil LA. Triv MR/AI. AoV sclerosis w/o stenosis. Asc Ao 38mm.   Essential hypertension    GERD (gastroesophageal reflux disease)    Helicobacter pylori gastritis    Treated   History of kidney stones    Hx of adenomatous colonic polyps    IBS (irritable bowel syndrome)    PAF (paroxysmal atrial fibrillation) (HCC)    Prostate CA (HCC)    Seed implants   Schatzki's ring    Sinus node dysfunction (HCC)    a. 12/2021 s/p SJM PPM.   Stroke [facility])     Patient Active Problem List   Diagnosis Date Noted   Visit for wound check 03/22/2021   Acute postoperative anemia due to expected blood loss 03/10/2021   Bilateral atelectasis 03/10/2021   S/P CABG x 2 03/09/2021   CAD (coronary artery disease) 03/09/2021   GERD (gastroesophageal reflux disease) 03/09/2021   HTN (hypertension) 03/09/2021   Bloating 12/25/2019   Abdominal pain 12/25/2019   Colonic adenoma 05/01/2018   Degenerative arthritis of left knee 11/01/2017    Abnormal cardiac function test 09/22/2015   Chronic renal disease, stage III (HCC) 09/22/2015   Family history of coronary artery disease in mother 09/22/2015   Bifascicular block 09/22/2015   Hypertrophy of breast 11/26/2014   AKI (acute kidney injury) (HCC) 01/24/2014   Impaired mobility and ADLs 12/05/2013   Bacteremia 11/15/2013   Spinal abscess (HCC) 11/15/2013   Thoracic kyphosis 10/28/2013   Hemiplegia of dominant side as late effect following cerebrovascular disease (HCC) 10/28/2013   Postoperative shock 10/28/2013   Acute diastolic congestive heart failure (HCC) 10/28/2013   Sciatica 10/18/2013   History of prostate cancer 10/08/2013   Left lumbar radiculopathy 10/08/2013   History of colonic polyps 07/23/2013   Osteoarthritis of right knee 03/22/2012   Common bile duct (CBD) stricture 01/16/2012   Epigastric pain 01/19/2011   Hx of adenomatous colonic polyps 01/19/2011   Irritable bowel syndrome with constipation 01/19/2011   Altered bowel function 01/19/2011   Rectal bleeding 01/19/2011   Thrombocytopenia (HCC) 01/19/2011    Past Surgical History:  Procedure Laterality Date   APPENDECTOMY  age 48   BACK SURGERY  02/08   [facility]   BACK SURGERY  09/24/04   lumbar, [facility]   BACK SURGERY  09/18/02   [facility]   BILIARY STENT PLACEMENT  01/07/2012   Procedure: BILIARY STENT PLACEMENT;  Surgeon: Malissa Hippo,  MD;  Location: [facility];  Service: Endoscopy;  Laterality: N/A;   BILIARY STENT PLACEMENT  02/21/2012   Procedure: BILIARY STENT PLACEMENT;  Surgeon: Malissa Hippo, MD;  Location: [facility];  Service: Endoscopy;  Laterality: N/A;  Biliary Stent Replacement   CATARACT EXTRACTION W/PHACO  11/22/2010   Procedure: CATARACT EXTRACTION PHACO AND INTRAOCULAR LENS PLACEMENT (IOC);  Surgeon: Susa Simmonds;  Location: [facility];  Service: Ophthalmology;  Laterality: Right;  CDE: 6.81   CHOLECYSTECTOMY  03/04/05   [facility], [facility]. Gangrenous cholecystitis complicated by abscess requiring  percutaneous drainage. He also had common duct stones requiring ERCP and sphincterotomy.   COLONOSCOPY  August 2005   Scattered sigmoid diverticulosis, splenic flexure polyp. Hyperplastic   COLONOSCOPY  1997   3 cm tubular adenoma and a sending colon   COLONOSCOPY  02/01/2011   Rourk-friable anal canal, hyperplastic rectal polyp   COLONOSCOPY N/A 08/15/2013   Procedure: COLONOSCOPY;  Surgeon: Malissa Hippo, MD;  Location: [facility];  Service: Endoscopy;  Laterality: N/A;  100   COLONOSCOPY N/A 09/20/2018   Procedure: COLONOSCOPY;  Surgeon: Malissa Hippo, MD;  [facility];  Service: Endoscopy;  Laterality: N/A;  12:00pm   CORONARY ARTERY BYPASS GRAFT N/A 03/09/2021   Procedure: OFF PUMP CORONARY ARTERY BYPASS GRAFTING (CABG) X2, USING LEFT INTERNAL MAMMARY ARTERY AND RIGHT LEG GREATER SAPHENOUS VEIN HARVESTED ENDOSCOPICALLY;  Surgeon: Corliss Skains, MD;  Location: [facility];  Service: Open Heart Surgery;  Laterality: N/A;  X 2   ERCP  03/02/05   [facility]. Normal-appearing biliary tree (gallbladder not image), status post sphincterotomy with recovery of small pieces of stone material, status post balloon occlusion cholangiogram.   ERCP  01/07/2012   Procedure: ENDOSCOPIC RETROGRADE CHOLANGIOPANCREATOGRAPHY (ERCP);  Surgeon: Malissa Hippo, MD;  Location: [facility];  Service: Endoscopy;  Laterality: N/A;  possible biliary stenting   ERCP  02/21/2012   Procedure: ENDOSCOPIC RETROGRADE CHOLANGIOPANCREATOGRAPHY (ERCP);  Surgeon: Malissa Hippo, MD;  Location: [facility];  Service: Endoscopy;  Laterality: N/A;  common bile duct stone and clip removed   ERCP N/A 05/18/2012   Procedure: ENDOSCOPIC RETROGRADE CHOLANGIOPANCREATOGRAPHY (ERCP);  Surgeon: Malissa Hippo, MD;  Location: [facility];  Service: Endoscopy;  Laterality: N/A;  stone and debri removal   ESOPHAGOGASTRODUODENOSCOPY  August 2005   Erosive esophagitis and Schatzki ring, small hiatal hernia   ESOPHAGOGASTRODUODENOSCOPY   01/2008   Mild reflux esophagitis, small hiatal hernia   ESOPHAGOGASTRODUODENOSCOPY  02/01/2011   Rourk-erosive reflux esophagitis, small hiatal hernia   ESOPHAGOGASTRODUODENOSCOPY  10/26/2011   Dr. Kerby Moors gastric submucosal petechia (bx-benign ulceration), juxta ampullary duodenal diverticulum and some mucosal edema involving 1st/2nd portion of duodenum with superficial erosions (bx-superficial ulceration/benign)   ESOPHAGOGASTRODUODENOSCOPY  02/21/2012   Procedure: ESOPHAGOGASTRODUODENOSCOPY (EGD);  Surgeon: Malissa Hippo, MD;  Location: [facility];  Service: Endoscopy;  Laterality: N/A;   EYE SURGERY     Incomplete colonoscopy  December 2009   Left-sided diverticula, mid descending colon polyp, due to recurrent looping and redundancy exam was incomplete. It was felt that the mid colon was reached. Followup barium enema showed colon interposition between the liver and the diaphragm, redundant sigmoid colon but no colon mass or polyp identified.. Pathology revealed tubular adenoma.   LEFT HEART CATH AND CORONARY ANGIOGRAPHY N/A 01/18/2021   Procedure: LEFT HEART CATH AND CORONARY ANGIOGRAPHY;  Surgeon: Runell Gess, MD;  [facility];  Service: Cardiovascular;  Laterality: N/A;   PACEMAKER IMPLANT N/A  12/22/2021   Procedure: PACEMAKER IMPLANT;  Surgeon: Marinus Maw, MD;  Location[facility] LAB;  Service: Cardiovascular;  Laterality: N/A;   POLYPECTOMY  09/20/2018   Procedure: POLYPECTOMY;  Surgeon: Malissa Hippo, MD;  [facility];  Service: Endoscopy;;  Hepatic flexure polyp cold snare, splenic flexure polyp   SPHINCTEROTOMY  01/07/2012   Procedure: SPHINCTEROTOMY;  Surgeon: Malissa Hippo, MD;  Location: [facility];  Service: Endoscopy;  Laterality: N/A;  Extended   SPYGLASS CHOLANGIOSCOPY  02/21/2012   Procedure: SPYGLASS CHOLANGIOSCOPY;  Surgeon: Malissa Hippo, MD;  Location: [facility];  Service: Endoscopy;  Laterality: N/A;   SPYGLASS  CHOLANGIOSCOPY N/A 05/18/2012   Procedure: SPYGLASS CHOLANGIOSCOPY;  Surgeon: Malissa Hippo, MD;  Location: [facility];  Service: Endoscopy;  Laterality: N/A;   TEE WITHOUT CARDIOVERSION N/A 03/09/2021   Procedure: TRANSESOPHAGEAL ECHOCARDIOGRAM (TEE);  Surgeon: Corliss Skains, MD;  Location[facility];  Service: Open Heart Surgery;  Laterality: N/A;   TOTAL KNEE ARTHROPLASTY  03/21/2012   Procedure: TOTAL KNEE ARTHROPLASTY;  Surgeon: [name], MD;  Location: [facility] OR;  Service: Orthopedics;  Laterality: Right;  right knee arthroplasty   TOTAL KNEE ARTHROPLASTY Left 04/08/2019   Procedure: LEFT TOTAL KNEE ARTHROPLASTY;  Surgeon: Gean Birchwood, MD;  Location: [facility];  Service: Orthopedics;  Laterality: Left;       Home Medications    Prior to Admission medications   Medication Sig Start Date End Date Taking? Authorizing Provider  oseltamivir (TAMIFLU) 75 MG capsule Take 1 capsule (75 mg total) by mouth every 12 (twelve) hours. 05/01/23  Yes Leath-Warren, Sadie Haber, NP  acetaminophen (TYLENOL) 500 MG tablet Take 500 mg by mouth every 6 (six) hours as needed (for pain.).    [provider]  amLODipine (NORVASC) 2.5 MG tablet Take 1 tablet (2.5 mg total) by mouth daily. 12/07/22 03/07/23  Marinus Maw, MD  apixaban (ELIQUIS) 2.5 MG TABS tablet Take 1 tablet (2.5 mg total) by mouth 2 (two) times daily. 03/02/23   Jonelle Sidle, MD  atorvastatin (LIPITOR) 20 MG tablet TAKE ONE (1) TABLET BY MOUTH EVERY DAY 11/09/22   Jonelle Sidle, MD  Docusate Sodium (COLACE PO) Take by mouth. Takes 2 at night    [provider]  ezetimibe (ZETIA) 10 MG tablet Take 10 mg by mouth at bedtime. 11/04/21   [provider]  linaclotide (LINZESS) 145 MCG CAPS capsule Take 1 capsule (145 mcg total) by mouth daily. 02/13/23   Carlan, Chelsea L, NP  trimethoprim (TRIMPEX) 100 MG tablet Take 1 tablet (100 mg total) by mouth at bedtime. 12/29/22   Bjorn Pippin, MD  Vibegron (GEMTESA) 75 MG  TABS Take 1 tablet (75 mg total) by mouth daily. 03/09/23   Bjorn Pippin, MD    Family History Family History  Problem Relation Age of Onset   Diabetes Mother    Coronary artery disease Mother    Coronary artery disease Father    Diabetes Father    Stomach cancer Father    Deep vein thrombosis Daughter    Hypotension Neg Hx    Anesthesia problems Neg Hx    Malignant hyperthermia Neg Hx    Pseudochol deficiency Neg Hx    Colon cancer Neg Hx     Social History Social History   Tobacco Use   Smoking status: Never   Smokeless tobacco: Never  Vaping Use   Vaping status: Never Used  Substance Use Topics  Alcohol use: No    Alcohol/week: 0.0 standard drinks of alcohol   Drug use: No     Allergies   Patient has no known allergies.   Review of Systems Review of Systems Per HPI  Physical Exam Triage Vital Signs ED Triage Vitals  Encounter Vitals Group     BP 05/01/23 1231 (!) 167/63     Systolic BP Percentile --      Diastolic BP Percentile --      Pulse Rate 05/01/23 1231 60     Resp 05/01/23 1231 18     Temp 05/01/23 1231 98.6 F (37 C)     Temp Source 05/01/23 1231 Oral     SpO2 05/01/23 1231 96 %     Weight --      Height --      Head Circumference --      Peak Flow --      Pain Score 05/01/23 1234 0     Pain Loc --      Pain Education --      Exclude from Growth Chart --    No data found.  Updated Vital Signs BP (!) 167/63 (BP Location: Right Arm)   Pulse 60   Temp 98.6 F (37 C) (Oral)   Resp 18   SpO2 96%   Visual Acuity Right Eye Distance:   Left Eye Distance:   Bilateral Distance:    Right Eye Near:   Left Eye Near:    Bilateral Near:     Physical Exam Vitals and nursing note reviewed.  Constitutional:      General: He is not in acute distress.    Appearance: Normal appearance.  HENT:     Head: Normocephalic.     Right Ear: Tympanic membrane, ear canal and external ear normal.     Left Ear: Tympanic membrane, ear canal and  external ear normal.     Nose: Congestion present.     Right Turbinates: Enlarged and swollen.     Left Turbinates: Enlarged and swollen.     Right Sinus: No maxillary sinus tenderness or frontal sinus tenderness.     Left Sinus: No maxillary sinus tenderness or frontal sinus tenderness.     Mouth/Throat:     Lips: Pink.     Mouth: Mucous membranes are moist.     Pharynx: Oropharynx is clear. Uvula midline.  Eyes:     Extraocular Movements: Extraocular movements intact.     Conjunctiva/sclera: Conjunctivae normal.     Pupils: Pupils are equal, round, and reactive to light.  Cardiovascular:     Rate and Rhythm: Normal rate and regular rhythm.     Pulses: Normal pulses.     Heart sounds: Normal heart sounds.  Pulmonary:     Effort: Pulmonary effort is normal. No respiratory distress.     Breath sounds: Normal breath sounds. No stridor. No wheezing, rhonchi or rales.  Abdominal:     General: Bowel sounds are normal.     Palpations: Abdomen is soft.     Tenderness: There is no abdominal tenderness.  Musculoskeletal:     Cervical back: Normal range of motion.  Lymphadenopathy:     Cervical: No cervical adenopathy.  Skin:    General: Skin is warm and dry.  Neurological:     General: No focal deficit present.     Mental Status: He is alert and oriented to person, place, and time.  Psychiatric:        Mood and  Affect: Mood normal.        Behavior: Behavior normal.      UC Treatments / Results  Labs (all labs ordered are listed, but only abnormal results are displayed) Labs Reviewed  POC COVID19/FLU A&B COMBO - Abnormal; Notable for the following components:      Result Value   Influenza A Antigen, POC Positive (*)    All other components within normal limits    EKG   Radiology No results found.  Procedures Procedures (including critical care time)  Medications Ordered in UC Medications - No data to display  Initial Impression / Assessment and Plan / UC Course   I have reviewed the triage vital signs and the nursing notes.  Pertinent labs & imaging results that were available during my care of the patient were reviewed by me and considered in my medical decision making (see chart for details).  COVID/flu test was positive for influenza A.  Will start Tamiflu 75 mg twice daily for the next 5 days.  Supportive care recommendations were provided and discussed with the patient to include fluids, rest, over-the-counter Tylenol, and continuing use of Coricidin and Flonase.  Discussed indications with the patient regarding follow-up.  Patient was in agreement with this plan of care and verbalizes understanding.  All questions were answered.  Patient stable for discharge.  Final Clinical Impressions(s) / UC Diagnoses   Final diagnoses:  Influenza A     Discharge Instructions      You have tested positive for influenza A. May take over-the-counter Tylenol as needed for pain, fever, or general discomfort. Continue to drink plenty of fluids.  Recommend the use of Pedialyte or Gatorade to prevent dehydration. Recommend using a humidifier in the bedroom at nighttime during sleep and having her sleep elevated on pillows while cough symptoms persist. If you develop a fever, you should remain home until you have been fever free for 24 hours with no medication. Please be advised that symptoms should improve over the next 5 to 7 days.  If symptoms fail to improve or appear to be worsening, you may follow-up in this clinic or with your primary care physician for further evaluation. Follow-up as needed.       ED Prescriptions     Medication Sig Dispense Auth. Provider   oseltamivir (TAMIFLU) 75 MG capsule Take 1 capsule (75 mg total) by mouth every 12 (twelve) hours. 10 capsule Leath-Warren, Sadie Haber, NP      PDMP not reviewed this encounter.   Abran Cantor, NP 05/02/23 1556

## 2023-05-01 NOTE — ED Triage Notes (Signed)
 Pt reports nasal congestion, head congestion, sinus drainage, x 2 days has tried Coricidin.

## 2023-05-02 NOTE — Progress Notes (Signed)
 Remote pacemaker transmission.

## 2023-05-04 DIAGNOSIS — R7303 Prediabetes: Secondary | ICD-10-CM | POA: Diagnosis not present

## 2023-05-04 DIAGNOSIS — I129 Hypertensive chronic kidney disease with stage 1 through stage 4 chronic kidney disease, or unspecified chronic kidney disease: Secondary | ICD-10-CM | POA: Diagnosis not present

## 2023-05-04 DIAGNOSIS — Z8546 Personal history of malignant neoplasm of prostate: Secondary | ICD-10-CM | POA: Diagnosis not present

## 2023-05-04 DIAGNOSIS — M25511 Pain in right shoulder: Secondary | ICD-10-CM | POA: Diagnosis not present

## 2023-05-04 DIAGNOSIS — N1832 Chronic kidney disease, stage 3b: Secondary | ICD-10-CM | POA: Diagnosis not present

## 2023-05-04 DIAGNOSIS — J019 Acute sinusitis, unspecified: Secondary | ICD-10-CM | POA: Diagnosis not present

## 2023-05-04 DIAGNOSIS — Z951 Presence of aortocoronary bypass graft: Secondary | ICD-10-CM | POA: Diagnosis not present

## 2023-05-04 DIAGNOSIS — E782 Mixed hyperlipidemia: Secondary | ICD-10-CM | POA: Diagnosis not present

## 2023-05-04 DIAGNOSIS — J302 Other seasonal allergic rhinitis: Secondary | ICD-10-CM | POA: Diagnosis not present

## 2023-05-04 DIAGNOSIS — R0609 Other forms of dyspnea: Secondary | ICD-10-CM | POA: Diagnosis not present

## 2023-05-04 DIAGNOSIS — I2510 Atherosclerotic heart disease of native coronary artery without angina pectoris: Secondary | ICD-10-CM | POA: Diagnosis not present

## 2023-05-04 DIAGNOSIS — Z0001 Encounter for general adult medical examination with abnormal findings: Secondary | ICD-10-CM | POA: Diagnosis not present

## 2023-05-05 ENCOUNTER — Ambulatory Visit: Payer: PPO | Admitting: Orthopedic Surgery

## 2023-05-09 ENCOUNTER — Ambulatory Visit: Admitting: Orthopedic Surgery

## 2023-05-12 ENCOUNTER — Ambulatory Visit (INDEPENDENT_AMBULATORY_CARE_PROVIDER_SITE_OTHER): Admitting: Orthopedic Surgery

## 2023-05-12 ENCOUNTER — Encounter: Payer: Self-pay | Admitting: Orthopedic Surgery

## 2023-05-12 DIAGNOSIS — M75122 Complete rotator cuff tear or rupture of left shoulder, not specified as traumatic: Secondary | ICD-10-CM

## 2023-05-12 NOTE — Progress Notes (Signed)
 Return patient Visit  Assessment: [name] is a 83 y.o. male with the following: Left shoulder rotator cuff tear  Plan: [name] has ongoing pain in the left shoulder.  MRI demonstrates a full-thickness tear of the rotator cuff, with approximately 2 cm of retraction.  We briefly discussed the possibility proceeding with rotator cuff repair versus reverse shoulder arthroplasty.  He is not interested in surgery.  He states he can deal with the pain.  He is interested in a repeat injection today.  This was completed without issues.  He will follow-up as needed.  Procedure note injection Left shoulder    Verbal consent was obtained to inject the left shoulder, subacromial space Timeout was completed to confirm the site of injection.  The skin was prepped with alcohol and ethyl chloride was sprayed at the injection site.  A 21-gauge needle was used to inject 40 mg of Depo-Medrol and 1% lidocaine (4 cc) into the subacromial space of the left shoulder using a posterolateral approach.  There were no complications. A sterile bandage was applied.   Follow-up: No follow-ups on file.  Subjective:  Chief Complaint  Patient presents with   Shoulder Pain    L feels the same here for MRI results.     History of Present Illness: [name] is a 83 y.o. male who returns to clinic for repeat evaluation of left shoulder pain.  I have seen him several times for his left shoulder.  He continues to have pain.  He has difficulty with overhead motion.  He does not remember a specific injury or event.  He has obtained an MRI, and is here to discuss the findings.  Review of Systems: No fevers or chills + numbness or tingling No chest pain No shortness of breath No bowel or bladder dysfunction No GI distress No headaches    Objective: There were no vitals taken for this visit.  Physical Exam:  General: Alert and oriented. and No acute distress. Gait: Normal gait.  Left shoulder without  swelling.  No redness.  Tenderness palpation over the anterior shoulder and bicipital groove.  Mild tenderness over the lateral shoulder.  Positive Jobe's.  Positive drop arm test.  Positive O'Brien's.  Fingers are warm and well-perfused.  Sensation intact throughout the left hand.  . IMAGING: I personally reviewed images previously obtained in clinic  MRI left shoulder  IMPRESSION: 1. Complete full-thickness, full width tear of the supraspinatus tendon with 16 mm of retraction. 2. Mild infraspinatus tendinosis. 3. Moderate subscapularis tendinosis. 4. Moderate tendinosis of the intra-articular portion of the long head of the biceps tendon.    New Medications:  No orders of the defined types were placed in this encounter.     Oliver Barre, MD  05/12/2023 9:20 AM

## 2023-05-12 NOTE — Patient Instructions (Signed)

## 2023-05-15 ENCOUNTER — Telehealth (INDEPENDENT_AMBULATORY_CARE_PROVIDER_SITE_OTHER): Payer: Self-pay | Admitting: Gastroenterology

## 2023-05-15 ENCOUNTER — Ambulatory Visit (INDEPENDENT_AMBULATORY_CARE_PROVIDER_SITE_OTHER): Payer: PPO | Admitting: Gastroenterology

## 2023-05-15 ENCOUNTER — Telehealth: Payer: Self-pay | Admitting: *Deleted

## 2023-05-15 ENCOUNTER — Encounter (INDEPENDENT_AMBULATORY_CARE_PROVIDER_SITE_OTHER): Payer: Self-pay | Admitting: Gastroenterology

## 2023-05-15 VITALS — BP 142/53 | HR 69 | Temp 97.1°F | Ht 74.0 in | Wt 234.6 lb

## 2023-05-15 DIAGNOSIS — R1012 Left upper quadrant pain: Secondary | ICD-10-CM

## 2023-05-15 DIAGNOSIS — K5900 Constipation, unspecified: Secondary | ICD-10-CM | POA: Diagnosis not present

## 2023-05-15 DIAGNOSIS — R1013 Epigastric pain: Secondary | ICD-10-CM | POA: Diagnosis not present

## 2023-05-15 DIAGNOSIS — R1030 Lower abdominal pain, unspecified: Secondary | ICD-10-CM | POA: Diagnosis not present

## 2023-05-15 DIAGNOSIS — Z8719 Personal history of other diseases of the digestive system: Secondary | ICD-10-CM

## 2023-05-15 MED ORDER — LUBIPROSTONE 8 MCG PO CAPS: 8.0000 ug | ORAL_CAPSULE | Freq: Two times a day (BID) | ORAL | 1 refills | Status: DC

## 2023-05-15 MED ORDER — LUBIPROSTONE 8 MCG PO CAPS: 8.0000 ug | ORAL_CAPSULE | Freq: Two times a day (BID) | ORAL | Status: DC

## 2023-05-15 MED ORDER — PEG 3350-KCL-NA BICARB-NACL 420 G PO SOLR: 4000.0000 mL | Freq: Once | ORAL | 0 refills | Status: AC

## 2023-05-15 NOTE — Telephone Encounter (Signed)
    05/15/23  [name] [birth year]  What type of surgery is being performed? Colonoscopy/EGD  When is surgery scheduled? 05/26/23  What type of clearance is required (medical or pharmacy to hold medication or both? Pharmacy to hold medication   Are there any medications that need to be held prior to surgery and how long? Clearance to hold Eliquis for 2 days prior   Name of physician performing surgery?  Dr. Katrinka Blazing [facility] Phone: [phone] Fax: [phone]  Anethesia type (none, local, MAC, general)? Choice

## 2023-05-15 NOTE — Telephone Encounter (Signed)
 Thanks for the update, no need to make any modifications to the current procedure Thanks

## 2023-05-15 NOTE — Telephone Encounter (Signed)
 Pt has been scheduled tele preop appt 05/22/23. Med rec and consent are done.

## 2023-05-15 NOTE — Telephone Encounter (Signed)
 Pt has been scheduled tele preop appt 05/22/23. Med rec and consent are done.       Patient Consent for Virtual Visit        [name] has provided verbal consent on 05/15/2023 for a virtual visit (video or telephone).   CONSENT FOR VIRTUAL VISIT FOR:  [name]  By participating in this virtual visit I agree to the following:  I hereby voluntarily request, consent and authorize [facility] and its employed or contracted physicians, physician assistants, nurse practitioners or other licensed health care professionals (the Practitioner), to provide me with telemedicine health care services (the "Services") as deemed necessary by the treating Practitioner. I acknowledge and consent to receive the Services by the Practitioner via telemedicine. I understand that the telemedicine visit will involve communicating with the Practitioner through live audiovisual communication technology and the disclosure of certain medical information by electronic transmission. I acknowledge that I have been given the opportunity to request an in-person assessment or other available alternative prior to the telemedicine visit and am voluntarily participating in the telemedicine visit.  I understand that I have the right to withhold or withdraw my consent to the use of telemedicine in the course of my care at any time, without affecting my right to future care or treatment, and that the Practitioner or I may terminate the telemedicine visit at any time. I understand that I have the right to inspect all information obtained and/or recorded in the course of the telemedicine visit and may receive copies of available information for a reasonable fee.  I understand that some of the potential risks of receiving the Services via telemedicine include:  Delay or interruption in medical evaluation due to technological equipment failure or disruption; Information transmitted may not be sufficient (e.g. poor resolution  of images) to allow for appropriate medical decision making by the Practitioner; and/or  In rare instances, security protocols could fail, causing a breach of personal health information.  Furthermore, I acknowledge that it is my responsibility to provide information about my medical history, conditions and care that is complete and accurate to the best of my ability. I acknowledge that Practitioner's advice, recommendations, and/or decision may be based on factors not within their control, such as incomplete or inaccurate data provided by me or distortions of diagnostic images or specimens that may result from electronic transmissions. I understand that the practice of medicine is not an exact science and that Practitioner makes no warranties or guarantees regarding treatment outcomes. I acknowledge that a copy of this consent can be made available to me via my patient portal [facility] MyChart), or I can request a printed copy by calling the office of [facility].    I understand that my insurance will be billed for this visit.   I have read or had this consent read to me. I understand the contents of this consent, which adequately explains the benefits and risks of the Services being provided via telemedicine.  I have been provided ample opportunity to ask questions regarding this consent and the Services and have had my questions answered to my satisfaction. I give my informed consent for the services to be provided through the use of telemedicine in my medical care

## 2023-05-15 NOTE — Telephone Encounter (Signed)
 Patient with diagnosis of afib on Eliquis for anticoagulation.    Procedure: Colonoscopy/EGD  Date of procedure: 05/26/23   CHA2DS2-VASc Score = 7   This indicates a 11.2% annual risk of stroke. The patient's score is based upon: CHF History: 1 HTN History: 1 Diabetes History: 0 Stroke History: 2 Vascular Disease History: 1 Age Score: 2 Gender Score: 0      CrCl 42 ml/min Platelet count 180  Per office protocol, patient can hold Eliquis for 2 days prior to procedure.    **This guidance is not considered finalized until pre-operative APP has relayed final recommendations.**

## 2023-05-15 NOTE — Telephone Encounter (Signed)
 Note attached to encounter form that stated last colon could get past hepatic flexure. Per Dr.Rehman notes Next colonoscopy would be undertaken with use of overtube and fluoroscopy

## 2023-05-15 NOTE — H&P (View-Only) (Signed)
 Referring Provider: Benita Stabile, MD Primary Care Physician:  Benita Stabile, MD Primary GI Physician: Dr. Levon Hedger   Chief Complaint  Patient presents with   Follow-up    Patient here today for a follow up on Constipation. He says he is not taking Linzess 145 mcg currently as he ran out. He says this works when he has it. He has been taking colace three bid, and this has not been helpful. He says he uses Miralax prn and this works well for him.    HPI:   [name] is a 83 y.o. male with past medical history of prostate cancer s/p radiation seeds, CKD, hypertension, GERD, IBS, Schatzki's ring   Patient presenting today for:  Follow up of Constipation, lower abdominal pain and epigastric pain  Last seen December 2024 , at that time did well on linzess samples, having 1 BM per day. Eating walnuts and taking 2 colace in the mornings, sometimes 2 at night. Some ongoing lower abdominal pain.   Recommended to continue linzess daily, stop colace.   Present: Patient states not currently on linzess as he ran out of samples. He does well when he takes that, has 2 good BMs on this but notes these are diarrhea with urgency and he feels cleaned out thereafter but cannot take it and go anywhere right after. He is currently taking colace 3 BID, having a BM every 3-4 days with very hard stools. Continues to have some lower abdominal pain. No rectal bleeding or melena. He cannot recall if his abdominal pain was better on linzess or not but thinks it was.   He notes he is having some epigastric pain as well that seems to be present almost all the time. No GERD symptoms. No precipitating or alleviating factors. Eating does not seem to change his pain. No nausea or vomiting. Feels he eats less than he used to.   CT renal stone study: 04/2022 No findings to explain the patient's clinical history. 2. No evidence of metastatic disease. 3. Left renal stone.  4.  Aortic atherosclerosis  (ICD10-I70.0). 5. Enlarged pulmonic trunk, indicative of pulmonary arterial hypertension. Virtual Colonoscopy: 10/2018 No significant colonic polyp, mass, apple core lesion, or stricture. Brachytherapy seeds in the prostate. No evidence of recurrent or metastatic disease 2020- Perianal skin tags found on perianal exam.                           - Incomplete exam to hepatic flexure.                           - One 7 mm polyp at the hepatic flexure, removed                            with a cold snare. Resected and retrieved.                           - One 5 mm polyp at the splenic flexure, removed                            with a cold snare. Resected and retrieved.                           -  Diverticulosis in the sigmoid colon.                           - External hemorrhoids tubular adenoma and the other one is sessile serrated polyp. His exam was incomplete to hepatic flexure.  EGD: 2013 small hiatal hernia   Filed Weights   05/15/23 0855  Weight: 234 lb 9.6 oz (106.4 kg)     Past Medical History:  Diagnosis Date   Arthritis    CAD (coronary artery disease)    Two-vessel obstructive disease November 2022; CABG with LIMA to LAD and SVG to PDA January 2023   Chronic back pain    CKD (chronic kidney disease) stage 3, GFR 30-59 ml/min (HCC)    Diastolic dysfunction    a. 10/2021 Echo: EF 55-60%, no rwma, GrI DD, nl RV fxn. Mildly dil LA. Triv MR/AI. AoV sclerosis w/o stenosis. Asc Ao 38mm.   Essential hypertension    GERD (gastroesophageal reflux disease)    Helicobacter pylori gastritis    Treated   History of kidney stones    Hx of adenomatous colonic polyps    IBS (irritable bowel syndrome)    PAF (paroxysmal atrial fibrillation) (HCC)    Prostate CA (HCC)    Seed implants   Schatzki's ring    Sinus node dysfunction (HCC)    a. 12/2021 s/p SJM PPM.   Stroke [facility])     Past Surgical History:  Procedure Laterality Date   APPENDECTOMY  age 65   BACK SURGERY  02/08    [facility]   BACK SURGERY  09/24/04   [facility], [facility]   BACK SURGERY  09/18/02   [facility]   BILIARY STENT PLACEMENT  01/07/2012   Procedure: BILIARY STENT PLACEMENT;  Surgeon: Malissa Hippo, MD;  Location: [facility];  Service: Endoscopy;  Laterality: N/A;   BILIARY STENT PLACEMENT  02/21/2012   Procedure: BILIARY STENT PLACEMENT;  Surgeon: Malissa Hippo, MD;  Location: [facility];  Service: Endoscopy;  Laterality: N/A;  Biliary Stent Replacement   CATARACT EXTRACTION W/PHACO  11/22/2010   Procedure: CATARACT EXTRACTION PHACO AND INTRAOCULAR LENS PLACEMENT (IOC);  Surgeon: Susa Simmonds;  Location: [facility];  Service: Ophthalmology;  Laterality: Right;  CDE: 6.81   CHOLECYSTECTOMY  03/04/05   [facility], [facility]. Gangrenous cholecystitis complicated by abscess requiring percutaneous drainage. He also had common duct stones requiring ERCP and sphincterotomy.   COLONOSCOPY  August 2005   Scattered sigmoid diverticulosis, splenic flexure polyp. Hyperplastic   COLONOSCOPY  1997   3 cm tubular adenoma and a sending colon   COLONOSCOPY  02/01/2011   Rourk-friable anal canal, hyperplastic rectal polyp   COLONOSCOPY N/A 08/15/2013   Procedure: COLONOSCOPY;  Surgeon: Malissa Hippo, MD;  [facility]: [facility];  Service: Endoscopy;  Laterality: N/A;  100   COLONOSCOPY N/A 09/20/2018   Procedure: COLONOSCOPY;  Surgeon: Malissa Hippo, MD;  [facility];  Service: Endoscopy;  Laterality: N/A;  12:00pm   CORONARY ARTERY BYPASS GRAFT N/A 03/09/2021   Procedure: OFF PUMP CORONARY ARTERY BYPASS GRAFTING (CABG) X2, USING LEFT INTERNAL MAMMARY ARTERY AND RIGHT LEG GREATER SAPHENOUS VEIN HARVESTED ENDOSCOPICALLY;  Surgeon: Corliss Skains, MD;  Location: [facility];  Service: Open Heart Surgery;  Laterality: N/A;  X 2   ERCP  03/02/05   [facility]. Normal-appearing biliary tree (gallbladder not image), status post sphincterotomy with recovery of small pieces of stone material, status post balloon occlusion cholangiogram.  ERCP  01/07/2012   Procedure: ENDOSCOPIC RETROGRADE CHOLANGIOPANCREATOGRAPHY (ERCP);  Surgeon: Malissa Hippo, MD;  Location: [facility];  Service: Endoscopy;  Laterality: N/A;  possible biliary stenting   ERCP  02/21/2012   Procedure: ENDOSCOPIC RETROGRADE CHOLANGIOPANCREATOGRAPHY (ERCP);  Surgeon: Malissa Hippo, MD;  Location: [facility];  Service: Endoscopy;  Laterality: N/A;  common bile duct stone and clip removed   ERCP N/A 05/18/2012   Procedure: ENDOSCOPIC RETROGRADE CHOLANGIOPANCREATOGRAPHY (ERCP);  Surgeon: Malissa Hippo, MD;  Location: [facility];  Service: Endoscopy;  Laterality: N/A;  stone and debri removal   ESOPHAGOGASTRODUODENOSCOPY  August 2005   Erosive esophagitis and Schatzki ring, small hiatal hernia   ESOPHAGOGASTRODUODENOSCOPY  01/2008   Mild reflux esophagitis, small hiatal hernia   ESOPHAGOGASTRODUODENOSCOPY  02/01/2011   Rourk-erosive reflux esophagitis, small hiatal hernia   ESOPHAGOGASTRODUODENOSCOPY  10/26/2011   Dr. Kerby Moors gastric submucosal petechia (bx-benign ulceration), juxta ampullary duodenal diverticulum and some mucosal edema involving 1st/2nd portion of duodenum with superficial erosions (bx-superficial ulceration/benign)   ESOPHAGOGASTRODUODENOSCOPY  02/21/2012   Procedure: ESOPHAGOGASTRODUODENOSCOPY (EGD);  Surgeon: Malissa Hippo, MD;  Location: [facility];  Service: Endoscopy;  Laterality: N/A;   EYE SURGERY     Incomplete colonoscopy  December 2009   Left-sided diverticula, mid descending colon polyp, due to recurrent looping and redundancy exam was incomplete. It was felt that the mid colon was reached. Followup barium enema showed colon interposition between the liver and the diaphragm, redundant sigmoid colon but no colon mass or polyp identified.. Pathology revealed tubular adenoma.   LEFT HEART CATH AND CORONARY ANGIOGRAPHY N/A 01/18/2021   Procedure: LEFT HEART CATH AND CORONARY ANGIOGRAPHY;  Surgeon: Runell Gess, MD;  [facility];  Service: Cardiovascular;  Laterality: N/A;   PACEMAKER IMPLANT N/A 12/22/2021   Procedure: PACEMAKER IMPLANT;  Surgeon: Marinus Maw, MD;  [facility]: [facility];  Service: Cardiovascular;  Laterality: N/A;   POLYPECTOMY  09/20/2018   Procedure: POLYPECTOMY;  Surgeon: Malissa Hippo, MD;  [facility];  Service: Endoscopy;;  Hepatic flexure polyp cold snare, splenic flexure polyp   SPHINCTEROTOMY  01/07/2012   Procedure: SPHINCTEROTOMY;  Surgeon: Malissa Hippo, MD;  Location: [facility];  Service: Endoscopy;  Laterality: N/A;  Extended   SPYGLASS CHOLANGIOSCOPY  02/21/2012   Procedure: SPYGLASS CHOLANGIOSCOPY;  Surgeon: Malissa Hippo, MD;  Location: [facility];  Service: Endoscopy;  Laterality: N/A;   SPYGLASS CHOLANGIOSCOPY N/A 05/18/2012   Procedure: SPYGLASS CHOLANGIOSCOPY;  Surgeon: Malissa Hippo, MD;  Location: [facility];  Service: Endoscopy;  Laterality: N/A;   TEE WITHOUT CARDIOVERSION N/A 03/09/2021   Procedure: TRANSESOPHAGEAL ECHOCARDIOGRAM (TEE);  Surgeon: Corliss Skains, MD;  Location: [facility];  Service: Open Heart Surgery;  Laterality: N/A;   TOTAL KNEE ARTHROPLASTY  03/21/2012   Procedure: TOTAL KNEE ARTHROPLASTY;  Surgeon: [name], MD;  Location: [facility];  Service: Orthopedics;  Laterality: Right;  right knee arthroplasty   TOTAL KNEE ARTHROPLASTY Left 04/08/2019   Procedure: LEFT TOTAL KNEE ARTHROPLASTY;  Surgeon: Gean Birchwood, MD;  Location: [facility];  Service: Orthopedics;  Laterality: Left;    Current Outpatient Medications  Medication Sig Dispense Refill   acetaminophen (TYLENOL) 500 MG tablet Take 500 mg by mouth every 6 (six) hours as needed (for pain.).     amLODipine (NORVASC) 2.5 MG tablet Take 1 tablet (2.5 mg total) by mouth daily. 90 tablet 3   apixaban (ELIQUIS) 2.5 MG TABS tablet Take 1 tablet (  2.5 mg total) by mouth 2 (two) times daily. 200 tablet 1   atorvastatin (LIPITOR) 20 MG tablet TAKE ONE (1) TABLET BY MOUTH EVERY DAY 90 tablet 3    Docusate Sodium (COLACE PO) Take by mouth. Takes 3 BID.     ezetimibe (ZETIA) 10 MG tablet Take 10 mg by mouth at bedtime.     trimethoprim (TRIMPEX) 100 MG tablet Take 1 tablet (100 mg total) by mouth at bedtime. 30 tablet 11   Vibegron (GEMTESA) 75 MG TABS Take 1 tablet (75 mg total) by mouth daily. 42 tablet 0   linaclotide (LINZESS) 145 MCG CAPS capsule Take 1 capsule (145 mcg total) by mouth daily. (Patient not taking: Reported on 05/15/2023) 30 capsule 5   No current facility-administered medications for this visit.    Allergies as of 05/15/2023   (No Known Allergies)    Social History   Socioeconomic History   Marital status: Married    Spouse name: Not on file   Number of children: 2   Years of education: Not on file   Highest education level: Not on file  Occupational History   Occupation: Retired Careers information officer: RETIRED  Tobacco Use   Smoking status: Never   Smokeless tobacco: Never  Vaping Use   Vaping status: Never Used  Substance and Sexual Activity   Alcohol use: No    Alcohol/week: 0.0 standard drinks of alcohol   Drug use: No   Sexual activity: Not on file  Other Topics Concern   Not on file  Social History Narrative   Not on file   Social Drivers of Health   Financial Resource Strain: Not on file  Food Insecurity: Not on file  Transportation Needs: Not on file  Physical Activity: Not on file  Stress: Not on file  Social Connections: Unknown (10/12/2022)   Received from [employer]   Social Network    Social Network: Not on file    Review of systems General: negative for malaise, night sweats, fever, chills, weight loss Neck: Negative for lumps, goiter, pain and significant neck swelling Resp: Negative for cough, wheezing, dyspnea at rest CV: Negative for chest pain, leg swelling, palpitations, orthopnea GI: denies melena, hematochezia, nausea, vomiting, diarrhea, dysphagia, odyonophagia, early satiety or unintentional weight loss.  +epigastric pain +lower abdominal pain +constipation  The remainder of the review of systems is noncontributory.  Physical Exam: BP (!) 142/53 (BP Location: Left Arm, Patient Position: Sitting, Cuff Size: Large)   Pulse 69   Temp (!) 97.1 F (36.2 C) (Temporal)   Ht 6\' 2"  (1.88 m)   Wt 234 lb 9.6 oz (106.4 kg)   BMI 30.12 kg/m  General:   Alert and oriented. No distress noted. Pleasant and cooperative.  Head:  Normocephalic and atraumatic. Eyes:  Conjuctiva clear without scleral icterus. Mouth:  Oral mucosa pink and moist. Good dentition. No lesions. Heart: Normal rate and rhythm, s1 and s2 heart sounds present.  Lungs: Clear lung sounds in all lobes. Respirations equal and unlabored. Abdomen:  +BS, soft, and non-distended. TTP of epigastric region, LUQ and diffuse lower abdomen. No rebound or guarding. No HSM or masses noted. Neurologic:  Alert and  oriented x4 Psych:  Alert and cooperative. Normal mood and affect.  Invalid input(s): "6 MONTHS"   ASSESSMENT: [name] is a 83 y.o. male presenting today for follow up of constipation, lower abdominal pain and with epigastric pain  Lower abdominal pain/constipation: Needs to have issues with constipation  noting hard stools and abdominal every 3 to 4 days.  He has failed both Colace and MiraLAX.  Has done well with Linzess though does note some fecal urgency and inability to leave/after taking this, the cost of Linzess is also quite high for him.  At this time would recommend trying Amitiza 8 mcg twice daily, continue with good water intake, diet high in fruits, vegetable grains.  Given last colonoscopy was in 2020, he continues to have lower abdominal pain, unsure if this is improved with improvement in constipation, would recommend proceeding with colonoscopy further evaluation of his symptoms.  Epigastric pain: Patient also reports some epigastric to left upper quadrant pain with some noted tenderness on exam today.  He denies any  other GERD symptoms, nausea, vomiting, early satiety or weight loss.  Last EGD in 2013 with small hiatal hernia, unclear if this as increased in size contribute to his pain though cannot rule out gastritis, PUD, duodenitis.  Recommend proceeding with upper endoscopy at time of colonoscopy for further evaluation.  Indications, risks and benefits of procedure discussed in detail with patient. Patient verbalized understanding and is in agreement to proceed with EGD/colonoscopy.     PLAN:  -Schedule EGD and Colonoscopy (will confirm need for overtube/fluorscopy with DR. Levon Hedger) will need to hold Eliquis, ASA III  -amitiza BID with meals  -Increase water intake, aim for atleast 64 oz per day Increase fruits, veggies and whole grains, kiwi and prunes are especially good for constipation  All questions were answered, patient verbalized understanding and is in agreement with plan as outlined above.   Follow Up: 3 months   Adan Baehr L. Jeanmarie Hubert, MSN, APRN, AGNP-C Adult-Gerontology Nurse Practitioner [facility]  I have reviewed the note and agree with the APP's assessment as described in this progress note  Katrinka Blazing, MD Gastroenterology and Hepatology [facility]

## 2023-05-15 NOTE — Progress Notes (Signed)
 Referring Provider: Benita Stabile, MD Primary Care Physician:  Benita Stabile, MD Primary GI Physician: Dr. Levon Hedger   Chief Complaint  Patient presents with   Follow-up    Patient here today for a follow up on Constipation. He says he is not taking Linzess 145 mcg currently as he ran out. He says this works when he has it. He has been taking colace three bid, and this has not been helpful. He says he uses Miralax prn and this works well for him.    HPI:   [name] is a 83 y.o. male with past medical history of prostate cancer s/p radiation seeds, CKD, hypertension, GERD, IBS, Schatzki's ring   Patient presenting today for:  Follow up of Constipation, lower abdominal pain and epigastric pain  Last seen December 2024 , at that time did well on linzess samples, having 1 BM per day. Eating walnuts and taking 2 colace in the mornings, sometimes 2 at night. Some ongoing lower abdominal pain.   Recommended to continue linzess daily, stop colace.   Present: Patient states not currently on linzess as he ran out of samples. He does well when he takes that, has 2 good BMs on this but notes these are diarrhea with urgency and he feels cleaned out thereafter but cannot take it and go anywhere right after. He is currently taking colace 3 BID, having a BM every 3-4 days with very hard stools. Continues to have some lower abdominal pain. No rectal bleeding or melena. He cannot recall if his abdominal pain was better on linzess or not but thinks it was.   He notes he is having some epigastric pain as well that seems to be present almost all the time. No GERD symptoms. No precipitating or alleviating factors. Eating does not seem to change his pain. No nausea or vomiting. Feels he eats less than he used to.   CT renal stone study: 04/2022 No findings to explain the patient's clinical history. 2. No evidence of metastatic disease. 3. Left renal stone.  4.  Aortic atherosclerosis  (ICD10-I70.0). 5. Enlarged pulmonic trunk, indicative of pulmonary arterial hypertension. Virtual Colonoscopy: 10/2018 No significant colonic polyp, mass, apple core lesion, or stricture. Brachytherapy seeds in the prostate. No evidence of recurrent or metastatic disease 2020- Perianal skin tags found on perianal exam.                           - Incomplete exam to hepatic flexure.                           - One 7 mm polyp at the hepatic flexure, removed                            with a cold snare. Resected and retrieved.                           - One 5 mm polyp at the splenic flexure, removed                            with a cold snare. Resected and retrieved.                           -  Diverticulosis in the sigmoid colon.                           - External hemorrhoids tubular adenoma and the other one is sessile serrated polyp. His exam was incomplete to hepatic flexure.  EGD: 2013 small hiatal hernia   Filed Weights   05/15/23 0855  Weight: 234 lb 9.6 oz (106.4 kg)     Past Medical History:  Diagnosis Date   Arthritis    CAD (coronary artery disease)    Two-vessel obstructive disease November 2022; CABG with LIMA to LAD and SVG to PDA January 2023   Chronic back pain    CKD (chronic kidney disease) stage 3, GFR 30-59 ml/min (HCC)    Diastolic dysfunction    a. 10/2021 Echo: EF 55-60%, no rwma, GrI DD, nl RV fxn. Mildly dil LA. Triv MR/AI. AoV sclerosis w/o stenosis. Asc Ao 38mm.   Essential hypertension    GERD (gastroesophageal reflux disease)    Helicobacter pylori gastritis    Treated   History of kidney stones    Hx of adenomatous colonic polyps    IBS (irritable bowel syndrome)    PAF (paroxysmal atrial fibrillation) (HCC)    Prostate CA (HCC)    Seed implants   Schatzki's ring    Sinus node dysfunction (HCC)    a. 12/2021 s/p SJM PPM.   Stroke [facility])     Past Surgical History:  Procedure Laterality Date   APPENDECTOMY  age 62   BACK SURGERY  02/08    [facility]   BACK SURGERY  09/24/04   lumbar, [facility]   BACK SURGERY  09/18/02   [facility]   BILIARY STENT PLACEMENT  01/07/2012   Procedure: BILIARY STENT PLACEMENT;  Surgeon: Malissa Hippo, MD;  Location: [facility]S;  Service: Endoscopy;  Laterality: N/A;   BILIARY STENT PLACEMENT  02/21/2012   Procedure: BILIARY STENT PLACEMENT;  Surgeon: Malissa Hippo, MD;  Location: [facility];  Service: Endoscopy;  Laterality: N/A;  Biliary Stent Replacement   CATARACT EXTRACTION W/PHACO  11/22/2010   Procedure: CATARACT EXTRACTION PHACO AND INTRAOCULAR LENS PLACEMENT (IOC);  Surgeon: Susa Simmonds;  Location: [facility];  Service: Ophthalmology;  Laterality: Right;  CDE: 6.81   CHOLECYSTECTOMY  03/04/05   [facility], [facility]. Gangrenous cholecystitis complicated by abscess requiring percutaneous drainage. He also had common duct stones requiring ERCP and sphincterotomy.   COLONOSCOPY  August 2005   Scattered sigmoid diverticulosis, splenic flexure polyp. Hyperplastic   COLONOSCOPY  1997   3 cm tubular adenoma and a sending colon   COLONOSCOPY  02/01/2011   Rourk-friable anal canal, hyperplastic rectal polyp   COLONOSCOPY N/A 08/15/2013   Procedure: COLONOSCOPY;  Surgeon: Malissa Hippo, MD;  Location: [facility];  Service: Endoscopy;  Laterality: N/A;  100   COLONOSCOPY N/A 09/20/2018   Procedure: COLONOSCOPY;  Surgeon: Malissa Hippo, MD;  Location: [facility];  Service: Endoscopy;  Laterality: N/A;  12:00pm   CORONARY ARTERY BYPASS GRAFT N/A 03/09/2021   Procedure: OFF PUMP CORONARY ARTERY BYPASS GRAFTING (CABG) X2, USING LEFT INTERNAL MAMMARY ARTERY AND RIGHT LEG GREATER SAPHENOUS VEIN HARVESTED ENDOSCOPICALLY;  Surgeon: Corliss Skains, MD;  Location: [facility] OR;  Service: Open Heart Surgery;  Laterality: N/A;  X 2   ERCP  03/02/05   [facility], [facility]. Normal-appearing biliary tree (gallbladder not image), status post sphincterotomy with recovery of small pieces of stone material, status post balloon occlusion cholangiogram.  ERCP  01/07/2012   Procedure: ENDOSCOPIC RETROGRADE CHOLANGIOPANCREATOGRAPHY (ERCP);  Surgeon: Malissa Hippo, MD;  Location: [facility];  Service: Endoscopy;  Laterality: N/A;  possible biliary stenting   ERCP  02/21/2012   Procedure: ENDOSCOPIC RETROGRADE CHOLANGIOPANCREATOGRAPHY (ERCP);  Surgeon: Malissa Hippo, MD;  Location: [facility];  Service: Endoscopy;  Laterality: N/A;  common bile duct stone and clip removed   ERCP N/A 05/18/2012   Procedure: ENDOSCOPIC RETROGRADE CHOLANGIOPANCREATOGRAPHY (ERCP);  Surgeon: Malissa Hippo, MD;  Location: [facility];  Service: Endoscopy;  Laterality: N/A;  stone and debri removal   ESOPHAGOGASTRODUODENOSCOPY  August 2005   Erosive esophagitis and Schatzki ring, small hiatal hernia   ESOPHAGOGASTRODUODENOSCOPY  01/2008   Mild reflux esophagitis, small hiatal hernia   ESOPHAGOGASTRODUODENOSCOPY  02/01/2011   Rourk-erosive reflux esophagitis, small hiatal hernia   ESOPHAGOGASTRODUODENOSCOPY  10/26/2011   Dr. Kerby Moors gastric submucosal petechia (bx-benign ulceration), juxta ampullary duodenal diverticulum and some mucosal edema involving 1st/2nd portion of duodenum with superficial erosions (bx-superficial ulceration/benign)   ESOPHAGOGASTRODUODENOSCOPY  02/21/2012   Procedure: ESOPHAGOGASTRODUODENOSCOPY (EGD);  Surgeon: Malissa Hippo, MD;  Location: [facility];  Service: Endoscopy;  Laterality: N/A;   EYE SURGERY     Incomplete colonoscopy  December 2009   Left-sided diverticula, mid descending colon polyp, due to recurrent looping and redundancy exam was incomplete. It was felt that the mid colon was reached. Followup barium enema showed colon interposition between the liver and the diaphragm, redundant sigmoid colon but no colon mass or polyp identified.. Pathology revealed tubular adenoma.   LEFT HEART CATH AND CORONARY ANGIOGRAPHY N/A 01/18/2021   Procedure: LEFT HEART CATH AND CORONARY ANGIOGRAPHY;  Surgeon: Runell Gess, MD;  [facility] IN[facility];  Service: Cardiovascular;  Laterality: N/A;   PACEMAKER IMPLANT N/A 12/22/2021   Procedure: PACEMAKER IMPLANT;  Surgeon: Marinus Maw, MD;  [facility]: [facility];  Service: Cardiovascular;  Laterality: N/A;   POLYPECTOMY  09/20/2018   Procedure: POLYPECTOMY;  Surgeon: Malissa Hippo, MD;  [facility];  Service: Endoscopy;;  Hepatic flexure polyp cold snare, splenic flexure polyp   SPHINCTEROTOMY  01/07/2012   Procedure: SPHINCTEROTOMY;  Surgeon: Malissa Hippo, MD;  Location: [facility];  Service: Endoscopy;  Laterality: N/A;  Extended   SPYGLASS CHOLANGIOSCOPY  02/21/2012   Procedure: SPYGLASS CHOLANGIOSCOPY;  Surgeon: Malissa Hippo, MD;  Location: [facility];  Service: Endoscopy;  Laterality: N/A;   SPYGLASS CHOLANGIOSCOPY N/A 05/18/2012   Procedure: SPYGLASS CHOLANGIOSCOPY;  Surgeon: Malissa Hippo, MD;  Location: [facility];  Service: Endoscopy;  Laterality: N/A;   TEE WITHOUT CARDIOVERSION N/A 03/09/2021   Procedure: TRANSESOPHAGEAL ECHOCARDIOGRAM (TEE);  Surgeon: Corliss Skains, MD;  Location: [facility] OR;  Service: Open Heart Surgery;  Laterality: N/A;   TOTAL KNEE ARTHROPLASTY  03/21/2012   Procedure: TOTAL KNEE ARTHROPLASTY;  Surgeon: [name], MD;  Location: [facility] OR;  Service: Orthopedics;  Laterality: Right;  right knee arthroplasty   TOTAL KNEE ARTHROPLASTY Left 04/08/2019   Procedure: LEFT TOTAL KNEE ARTHROPLASTY;  Surgeon: Gean Birchwood, MD;  Location: [facility]S;  Service: Orthopedics;  Laterality: Left;    Current Outpatient Medications  Medication Sig Dispense Refill   acetaminophen (TYLENOL) 500 MG tablet Take 500 mg by mouth every 6 (six) hours as needed (for pain.).     amLODipine (NORVASC) 2.5 MG tablet Take 1 tablet (2.5 mg total) by mouth daily. 90 tablet 3   apixaban (ELIQUIS) 2.5 MG TABS tablet Take 1 tablet (  2.5 mg total) by mouth 2 (two) times daily. 200 tablet 1   atorvastatin (LIPITOR) 20 MG tablet TAKE ONE (1) TABLET BY MOUTH EVERY DAY 90 tablet 3    Docusate Sodium (COLACE PO) Take by mouth. Takes 3 BID.     ezetimibe (ZETIA) 10 MG tablet Take 10 mg by mouth at bedtime.     trimethoprim (TRIMPEX) 100 MG tablet Take 1 tablet (100 mg total) by mouth at bedtime. 30 tablet 11   Vibegron (GEMTESA) 75 MG TABS Take 1 tablet (75 mg total) by mouth daily. 42 tablet 0   linaclotide (LINZESS) 145 MCG CAPS capsule Take 1 capsule (145 mcg total) by mouth daily. (Patient not taking: Reported on 05/15/2023) 30 capsule 5   No current facility-administered medications for this visit.    Allergies as of 05/15/2023   (No Known Allergies)    Social History   Socioeconomic History   Marital status: Married    Spouse name: Not on file   Number of children: 2   Years of education: Not on file   Highest education level: Not on file  Occupational History   Occupation: Retired Careers information officer: RETIRED  Tobacco Use   Smoking status: Never   Smokeless tobacco: Never  Vaping Use   Vaping status: Never Used  Substance and Sexual Activity   Alcohol use: No    Alcohol/week: 0.0 standard drinks of alcohol   Drug use: No   Sexual activity: Not on file  Other Topics Concern   Not on file  Social History Narrative   Not on file   Social Drivers of Health   Financial Resource Strain: Not on file  Food Insecurity: Not on file  Transportation Needs: Not on file  Physical Activity: Not on file  Stress: Not on file  Social Connections: Unknown (10/12/2022)   Received from [employer]   Social Network    Social Network: Not on file    Review of systems General: negative for malaise, night sweats, fever, chills, weight loss Neck: Negative for lumps, goiter, pain and significant neck swelling Resp: Negative for cough, wheezing, dyspnea at rest CV: Negative for chest pain, leg swelling, palpitations, orthopnea GI: denies melena, hematochezia, nausea, vomiting, diarrhea, dysphagia, odyonophagia, early satiety or unintentional weight loss.  +epigastric pain +lower abdominal pain +constipation  The remainder of the review of systems is noncontributory.  Physical Exam: BP (!) 142/53 (BP Location: Left Arm, Patient Position: Sitting, Cuff Size: Large)   Pulse 69   Temp (!) 97.1 F (36.2 C) (Temporal)   Ht 6\' 2"  (1.88 m)   Wt 234 lb 9.6 oz (106.4 kg)   BMI 30.12 kg/m  General:   Alert and oriented. No distress noted. Pleasant and cooperative.  Head:  Normocephalic and atraumatic. Eyes:  Conjuctiva clear without scleral icterus. Mouth:  Oral mucosa pink and moist. Good dentition. No lesions. Heart: Normal rate and rhythm, s1 and s2 heart sounds present.  Lungs: Clear lung sounds in all lobes. Respirations equal and unlabored. Abdomen:  +BS, soft, and non-distended. TTP of epigastric region, LUQ and diffuse lower abdomen. No rebound or guarding. No HSM or masses noted. Neurologic:  Alert and  oriented x4 Psych:  Alert and cooperative. Normal mood and affect.  Invalid input(s): "6 MONTHS"   ASSESSMENT: [name] is a 83 y.o. male presenting today for follow up of constipation, lower abdominal pain and with epigastric pain  Lower abdominal pain/constipation: Needs to have issues with constipation  noting hard stools and abdominal every 3 to 4 days.  He has failed both Colace and MiraLAX.  Has done well with Linzess though does note some fecal urgency and inability to leave/after taking this, the cost of Linzess is also quite high for him.  At this time would recommend trying Amitiza 8 mcg twice daily, continue with good water intake, diet high in fruits, vegetable grains.  Given last colonoscopy was in 2020, he continues to have lower abdominal pain, unsure if this is improved with improvement in constipation, would recommend proceeding with colonoscopy further evaluation of his symptoms.  Epigastric pain: Patient also reports some epigastric to left upper quadrant pain with some noted tenderness on exam today.  He denies any  other GERD symptoms, nausea, vomiting, early satiety or weight loss.  Last EGD in 2013 with small hiatal hernia, unclear if this as increased in size contribute to his pain though cannot rule out gastritis, PUD, duodenitis.  Recommend proceeding with upper endoscopy at time of colonoscopy for further evaluation.  Indications, risks and benefits of procedure discussed in detail with patient. Patient verbalized understanding and is in agreement to proceed with EGD/colonoscopy.     PLAN:  -Schedule EGD and Colonoscopy (will confirm need for overtube/fluorscopy with DR. Levon Hedger) will need to hold Eliquis, ASA III  -amitiza BID with meals  -Increase water intake, aim for atleast 64 oz per day Increase fruits, veggies and whole grains, kiwi and prunes are especially good for constipation  All questions were answered, patient verbalized understanding and is in agreement with plan as outlined above.   Follow Up: 3 months   Shaurya Rawdon L. Jeanmarie Hubert, MSN, APRN, AGNP-C Adult-Gerontology Nurse Practitioner [facility]  I have reviewed the note and agree with the APP's assessment as described in this progress note  Katrinka Blazing, MD Gastroenterology and Hepatology [facility]

## 2023-05-15 NOTE — Telephone Encounter (Signed)
 Primary Cardiologist:Samuel Diona Browner, MD   Preoperative team, please contact this patient and set up a phone call appointment for further preoperative risk assessment. Please obtain consent and complete medication review. Thank you for your help.   I confirm that guidance regarding antiplatelet and oral anticoagulation therapy has been completed and, if necessary, noted below.  I also confirmed the patient resides in the state of [place]. As per [facility] Medical Board telemedicine laws, the patient must reside in the state in which the provider is licensed.   Levi Aland, NP-C  05/15/2023, 11:34 AM [address] Office [phone] Fax [phone]

## 2023-05-15 NOTE — Patient Instructions (Addendum)
 We will get you scheduled for upper endoscopy and colonoscopy I have sent amitiza twice daily with a meal Increase water intake, aim for atleast 64 oz per day Increase fruits, veggies and whole grains, kiwi and prunes are especially good for constipation  Follow up 3 months  It was a pleasure to see you today. I want to create trusting relationships with patients and provide genuine, compassionate, and quality care. I truly value your feedback! please be on the lookout for a survey regarding your visit with me today. I appreciate your input about our visit and your time in completing this!    Adely Facer L. Jeanmarie Hubert, MSN, APRN, AGNP-C Adult-Gerontology Nurse Practitioner [facility] at [facility]

## 2023-05-16 ENCOUNTER — Encounter (INDEPENDENT_AMBULATORY_CARE_PROVIDER_SITE_OTHER): Payer: Self-pay

## 2023-05-22 ENCOUNTER — Ambulatory Visit: Attending: Physician Assistant

## 2023-05-22 DIAGNOSIS — Z01810 Encounter for preprocedural cardiovascular examination: Secondary | ICD-10-CM

## 2023-05-22 NOTE — Progress Notes (Signed)
 Virtual Visit via Telephone Note   Because of TRA WILEMON co-morbid illnesses, he is at least at moderate risk for complications without adequate follow up.  This format is felt to be most appropriate for this patient at this time.  Due to technical limitations with video connection (technology), today's appointment will be conducted as an audio only telehealth visit, and GARVIN ELLENA verbally agreed to proceed in this manner.   All issues noted in this document were discussed and addressed.  No physical exam could be performed with this format.  Evaluation Performed:  Preoperative cardiovascular risk assessment _____________   Date:  05/22/2023   Patient ID:  [name], DOB 17-Aug-1940, MRN [id number] Patient Location:  Home Provider location:   Office  Primary Care Provider:  Benita Stabile, MD Primary Cardiologist:  Nona Dell, MD  Chief Complaint / Patient Profile   83 y.o. y/o male with a h/o two-vessel CABG January 2023, hypertension, hyperlipidemia, diastolic dysfunction, stroke, left upper extremity DVT, stage III chronic kidney disease, sinus node dysfunction status post PPM November 2023, paroxysmal atrial fibrillation, prostate cancer, irritable bowel syndrome, chronic back pain who is pending colonoscopy and presents today for telephonic preoperative cardiovascular risk assessment.  History of Present Illness    [name] is a 83 y.o. male who presents via audio/video conferencing for a telehealth visit today.  Pt was last seen in cardiology clinic on 02/13/2023 by Ward Givens, NP.  At that time REEGAN BOUFFARD was doing well without chest pain or SOB.  The patient is now pending procedure as outlined above. Since his last visit, he tells me he has not had any swelling or shortness of breath.  Nothing new.  No chest pains.  Blood pressure has been moderately controlled with his last value being 142/53.  He does meet 4 METS on the DASI.  Per office protocol, patient can hold  Eliquis for 2 days prior to procedure. Please resume when medically safe to do so.  Past Medical History    Past Medical History:  Diagnosis Date   Arthritis    CAD (coronary artery disease)    Two-vessel obstructive disease November 2022; CABG with LIMA to LAD and SVG to PDA January 2023   Chronic back pain    CKD (chronic kidney disease) stage 3, GFR 30-59 ml/min (HCC)    Diastolic dysfunction    a. 10/2021 Echo: EF 55-60%, no rwma, GrI DD, nl RV fxn. Mildly dil LA. Triv MR/AI. AoV sclerosis w/o stenosis. Asc Ao 38mm.   Essential hypertension    GERD (gastroesophageal reflux disease)    Helicobacter pylori gastritis    Treated   History of kidney stones    Hx of adenomatous colonic polyps    IBS (irritable bowel syndrome)    PAF (paroxysmal atrial fibrillation) (HCC)    Prostate CA (HCC)    Seed implants   Schatzki's ring    Sinus node dysfunction (HCC)    a. 12/2021 s/p SJM PPM.   Stroke [facility])    Past Surgical History:  Procedure Laterality Date   APPENDECTOMY  age 67   BACK SURGERY  02/08   [facility]   BACK SURGERY  09/24/04   lumbar, [facility]   BACK SURGERY  09/18/02   [facility]   BILIARY STENT PLACEMENT  01/07/2012   Procedure: BILIARY STENT PLACEMENT;  Surgeon: Malissa Hippo, MD;  Location: [facility]S;  Service: Endoscopy;  Laterality: N/A;   BILIARY STENT PLACEMENT  02/21/2012   Procedure: BILIARY STENT PLACEMENT;  Surgeon: Malissa Hippo, MD;  Location: [facility];  Service: Endoscopy;  Laterality: N/A;  Biliary Stent Replacement   CATARACT EXTRACTION W/PHACO  11/22/2010   Procedure: CATARACT EXTRACTION PHACO AND INTRAOCULAR LENS PLACEMENT (IOC);  Surgeon: Susa Simmonds;  Location: [facility];  Service: Ophthalmology;  Laterality: Right;  CDE: 6.81   CHOLECYSTECTOMY  03/04/05   [facility], [facility]. Gangrenous cholecystitis complicated by abscess requiring percutaneous drainage. He also had common duct stones requiring ERCP and sphincterotomy.   COLONOSCOPY  August 2005   Scattered sigmoid  diverticulosis, splenic flexure polyp. Hyperplastic   COLONOSCOPY  1997   3 cm tubular adenoma and a sending colon   COLONOSCOPY  02/01/2011   Rourk-friable anal canal, hyperplastic rectal polyp   COLONOSCOPY N/A 08/15/2013   Procedure: COLONOSCOPY;  Surgeon: Malissa Hippo, MD;  Location: [facility];  Service: Endoscopy;  Laterality: N/A;  100   COLONOSCOPY N/A 09/20/2018   Procedure: COLONOSCOPY;  Surgeon: Malissa Hippo, MD;  [facility]: [facility];  Service: Endoscopy;  Laterality: N/A;  12:00pm   CORONARY ARTERY BYPASS GRAFT N/A 03/09/2021   Procedure: OFF PUMP CORONARY ARTERY BYPASS GRAFTING (CABG) X2, USING LEFT INTERNAL MAMMARY ARTERY AND RIGHT LEG GREATER SAPHENOUS VEIN HARVESTED ENDOSCOPICALLY;  Surgeon: Corliss Skains, MD;  Location: [facility] OR;  Service: Open Heart Surgery;  Laterality: N/A;  X 2   ERCP  03/02/05   [facility], [facility]. Normal-appearing biliary tree (gallbladder not image), status post sphincterotomy with recovery of small pieces of stone material, status post balloon occlusion cholangiogram.   ERCP  01/07/2012   Procedure: ENDOSCOPIC RETROGRADE CHOLANGIOPANCREATOGRAPHY (ERCP);  Surgeon: Malissa Hippo, MD;  Location: [facility];  Service: Endoscopy;  Laterality: N/A;  possible biliary stenting   ERCP  02/21/2012   Procedure: ENDOSCOPIC RETROGRADE CHOLANGIOPANCREATOGRAPHY (ERCP);  Surgeon: Malissa Hippo, MD;  Location: [facility];  Service: Endoscopy;  Laterality: N/A;  common bile duct stone and clip removed   ERCP N/A 05/18/2012   Procedure: ENDOSCOPIC RETROGRADE CHOLANGIOPANCREATOGRAPHY (ERCP);  Surgeon: Malissa Hippo, MD;  Location: [facility];  Service: Endoscopy;  Laterality: N/A;  stone and debri removal   ESOPHAGOGASTRODUODENOSCOPY  August 2005   Erosive esophagitis and Schatzki ring, small hiatal hernia   ESOPHAGOGASTRODUODENOSCOPY  01/2008   Mild reflux esophagitis, small hiatal hernia   ESOPHAGOGASTRODUODENOSCOPY  02/01/2011   Rourk-erosive reflux esophagitis, small  hiatal hernia   ESOPHAGOGASTRODUODENOSCOPY  10/26/2011   Dr. Kerby Moors gastric submucosal petechia (bx-benign ulceration), juxta ampullary duodenal diverticulum and some mucosal edema involving 1st/2nd portion of duodenum with superficial erosions (bx-superficial ulceration/benign)   ESOPHAGOGASTRODUODENOSCOPY  02/21/2012   Procedure: ESOPHAGOGASTRODUODENOSCOPY (EGD);  Surgeon: Malissa Hippo, MD;  Location: [facility];  Service: Endoscopy;  Laterality: N/A;   EYE SURGERY     Incomplete colonoscopy  December 2009   Left-sided diverticula, mid descending colon polyp, due to recurrent looping and redundancy exam was incomplete. It was felt that the mid colon was reached. Followup barium enema showed colon interposition between the liver and the diaphragm, redundant sigmoid colon but no colon mass or polyp identified.. Pathology revealed tubular adenoma.   LEFT HEART CATH AND CORONARY ANGIOGRAPHY N/A 01/18/2021   Procedure: LEFT HEART CATH AND CORONARY ANGIOGRAPHY;  Surgeon: Runell Gess, MD;  [facility]: [facility] INVAS[facility] LAB;  Service: Cardiovascular;  Laterality: N/A;   PACEMAKER IMPLANT N/A 12/22/2021   Procedure: PACEMAKER IMPLANT;  Surgeon: Marinus Maw, MD;  Location: [facility] INVASIVE CV  LAB;  Service: Cardiovascular;  Laterality: N/A;   POLYPECTOMY  09/20/2018   Procedure: POLYPECTOMY;  Surgeon: Malissa Hippo, MD;  [facility];  Service: Endoscopy;;  Hepatic flexure polyp cold snare, splenic flexure polyp   SPHINCTEROTOMY  01/07/2012   Procedure: SPHINCTEROTOMY;  Surgeon: Malissa Hippo, MD;  Location: [facility];  Service: Endoscopy;  Laterality: N/A;  Extended   SPYGLASS CHOLANGIOSCOPY  02/21/2012   Procedure: SPYGLASS CHOLANGIOSCOPY;  Surgeon: Malissa Hippo, MD;  Location: [facility];  Service: Endoscopy;  Laterality: N/A;   SPYGLASS CHOLANGIOSCOPY N/A 05/18/2012   Procedure: SPYGLASS CHOLANGIOSCOPY;  Surgeon: Malissa Hippo, MD;  Location: [facility];  Service: Endoscopy;  Laterality:  N/A;   TEE WITHOUT CARDIOVERSION N/A 03/09/2021   Procedure: TRANSESOPHAGEAL ECHOCARDIOGRAM (TEE);  Surgeon: Corliss Skains, MD;  Location[facility] OR;  Service: Open Heart Surgery;  Laterality: N/A;   TOTAL KNEE ARTHROPLASTY  03/21/2012   Procedure: TOTAL KNEE ARTHROPLASTY;  Surgeon: [name], MD;  Location: [facility] OR;  Service: Orthopedics;  Laterality: Right;  right knee arthroplasty   TOTAL KNEE ARTHROPLASTY Left 04/08/2019   Procedure: LEFT TOTAL KNEE ARTHROPLASTY;  Surgeon: Gean Birchwood, MD;  Location: [facility] ORS;  Service: Orthopedics;  Laterality: Left;    Allergies  No Known Allergies  Home Medications    Prior to Admission medications   Medication Sig Start Date End Date Taking? Authorizing Provider  acetaminophen (TYLENOL) 500 MG tablet Take 500 mg by mouth every 6 (six) hours as needed (for pain.).    [provider]  amLODipine (NORVASC) 2.5 MG tablet Take 1 tablet (2.5 mg total) by mouth daily. 12/07/22 05/15/23  Marinus Maw, MD  apixaban (ELIQUIS) 2.5 MG TABS tablet Take 1 tablet (2.5 mg total) by mouth 2 (two) times daily. 03/02/23   Jonelle Sidle, MD  atorvastatin (LIPITOR) 20 MG tablet TAKE ONE (1) TABLET BY MOUTH EVERY DAY 11/09/22   Jonelle Sidle, MD  Docusate Sodium (COLACE PO) Take by mouth. Takes 3 BID.    [provider]  ezetimibe (ZETIA) 10 MG tablet Take 10 mg by mouth at bedtime. 11/04/21   [provider]  lubiprostone (AMITIZA) 8 MCG capsule Take 1 capsule (8 mcg total) by mouth 2 (two) times daily with a meal. 05/15/23   Carlan, Chelsea L, NP  trimethoprim (TRIMPEX) 100 MG tablet Take 1 tablet (100 mg total) by mouth at bedtime. 12/29/22   Bjorn Pippin, MD  Vibegron (GEMTESA) 75 MG TABS Take 1 tablet (75 mg total) by mouth daily. 03/09/23   Bjorn Pippin, MD    Physical Exam    Vital Signs:  [name] does not have vital signs available for review today.  142/53  Given telephonic nature of communication, physical exam is  limited. AAOx3. NAD. Normal affect.  Speech and respirations are unlabored.  Accessory Clinical Findings    None  Assessment & Plan    1.  Preoperative Cardiovascular Risk Assessment:  Mr. Hartman perioperative risk of a major cardiac event is 11% according to the Revised Cardiac Risk Index (RCRI).  Therefore, he is at high risk for perioperative complications.   His functional capacity is good at 5.62 METs according to the Duke Activity Status Index (DASI). Recommendations: According to ACC/AHA guidelines, no further cardiovascular testing needed.  The patient may proceed to surgery at acceptable risk.   Antiplatelet and/or Anticoagulation Recommendations:  Eliquis (Apixaban) can be held for 2 days prior to surgery.  Please resume  post op when felt to be safe.     The patient was advised that if he develops new symptoms prior to surgery to contact our office to arrange for a follow-up visit, and he verbalized understanding.   A copy of this note will be routed to requesting surgeon.  Time:   Today, I have spent 5 minutes with the patient with telehealth technology discussing medical history, symptoms, and management plan.     Sharlene Dory, PA-C  05/22/2023, 8:30 AM

## 2023-05-24 ENCOUNTER — Telehealth (INDEPENDENT_AMBULATORY_CARE_PROVIDER_SITE_OTHER): Payer: Self-pay | Admitting: Gastroenterology

## 2023-05-24 ENCOUNTER — Encounter: Payer: Self-pay | Admitting: Internal Medicine

## 2023-05-24 ENCOUNTER — Encounter (HOSPITAL_COMMUNITY): Payer: Self-pay

## 2023-05-24 ENCOUNTER — Other Ambulatory Visit: Payer: Self-pay

## 2023-05-24 ENCOUNTER — Encounter (HOSPITAL_COMMUNITY)
Admission: RE | Admit: 2023-05-24 | Discharge: 2023-05-24 | Disposition: A | Discharge status: Home / Self Care | Source: Ambulatory Visit | Attending: Gastroenterology | Admitting: Gastroenterology

## 2023-05-24 NOTE — Pre-Procedure Instructions (Signed)
 Pre-op phone call done. Patient states he did not receive prep instructions. Copy printed and left for him at front desk.

## 2023-05-24 NOTE — Progress Notes (Signed)
   05/24/23 0910  OBSTRUCTIVE SLEEP APNEA  Have you ever been diagnosed with sleep apnea through a sleep study? No  Do you snore loudly (loud enough to be heard through closed doors)?  0  Do you often feel tired, fatigued, or sleepy during the daytime (such as falling asleep during driving or talking to someone)? 0  Has anyone observed you stop breathing during your sleep? 0  Do you have, or are you being treated for high blood pressure? 1  BMI more than 35 kg/m2? 1  Age > 50 (1-yes) 1  Neck circumference greater than:Male 16 inches or larger, Male 17inches or larger? 0  Male Gender (Yes=1) 1  Obstructive Sleep Apnea Score 4  Score 5 or greater  Results sent to PCP

## 2023-05-24 NOTE — Telephone Encounter (Signed)
 Pt came to front window asking when he needed to start his prep and what time to arrive at the hospital on Friday for his colonoscopy. I asked him if he went to his pre op today and he said someone called him and said he didn't need to go. I printed off his prep instructions and went over it with him and told him I would have the scheduler call him in the morning to let him know what time to arrive on  Friday. [phone]

## 2023-05-24 NOTE — Progress Notes (Signed)
 PERIOPERATIVE PRESCRIPTION FOR IMPLANTED CARDIAC DEVICE PROGRAMMING  Patient Information: Name:  [name]  DOB:  04-11-1940  MRN:  [id number]  Planned Procedure:  colonoscopy Surgeon:  Dolores Frame, MD  Date of Procedure: 05/26/2023 Position during surgery:  left lateral  Device Information:  Clinic EP Physician:  Lewayne Bunting, MD   Device Type:  Pacemaker Manufacturer and Phone #:  [employer]. Jude/[employer]ott: [phone] Pacemaker Dependent?:  No. Date of Last Device Check:  03/27/2023 Normal Device Function?:  Yes.    Electrophysiologist's Recommendations:  Have magnet available. Provide continuous ECG monitoring when magnet is used or reprogramming is to be performed.  Procedure should not interfere with device function.  No device programming or magnet placement needed.  Per Device Clinic Standing Orders, Wiliam Ke, RN  1:55 PM 05/24/2023

## 2023-05-25 NOTE — Telephone Encounter (Signed)
 Pt contacted and made aware to be at [facility] at 11:30am. Micah Flesher over instructions regarding when to take prep. Pt verbalized understanding

## 2023-05-26 ENCOUNTER — Ambulatory Visit (HOSPITAL_COMMUNITY)
Admission: RE | Admit: 2023-05-26 | Discharge: 2023-05-26 | Disposition: A | Discharge status: Home / Self Care | Attending: Gastroenterology | Admitting: Gastroenterology

## 2023-05-26 ENCOUNTER — Ambulatory Visit (HOSPITAL_COMMUNITY): Admitting: Anesthesiology

## 2023-05-26 ENCOUNTER — Encounter (HOSPITAL_COMMUNITY)
Admission: RE | Disposition: A | Discharge status: Home / Self Care | Payer: Self-pay | Source: Home / Self Care | Attending: Gastroenterology

## 2023-05-26 ENCOUNTER — Encounter (HOSPITAL_COMMUNITY): Payer: Self-pay | Admitting: Gastroenterology

## 2023-05-26 DIAGNOSIS — D123 Benign neoplasm of transverse colon: Secondary | ICD-10-CM

## 2023-05-26 DIAGNOSIS — K5900 Constipation, unspecified: Secondary | ICD-10-CM | POA: Diagnosis not present

## 2023-05-26 DIAGNOSIS — Z951 Presence of aortocoronary bypass graft: Secondary | ICD-10-CM | POA: Diagnosis not present

## 2023-05-26 DIAGNOSIS — K269 Duodenal ulcer, unspecified as acute or chronic, without hemorrhage or perforation: Secondary | ICD-10-CM | POA: Diagnosis not present

## 2023-05-26 DIAGNOSIS — R1013 Epigastric pain: Secondary | ICD-10-CM

## 2023-05-26 DIAGNOSIS — I700 Atherosclerosis of aorta: Secondary | ICD-10-CM | POA: Diagnosis not present

## 2023-05-26 DIAGNOSIS — K3189 Other diseases of stomach and duodenum: Secondary | ICD-10-CM

## 2023-05-26 DIAGNOSIS — D120 Benign neoplasm of cecum: Secondary | ICD-10-CM

## 2023-05-26 DIAGNOSIS — K648 Other hemorrhoids: Secondary | ICD-10-CM | POA: Diagnosis not present

## 2023-05-26 DIAGNOSIS — I4891 Unspecified atrial fibrillation: Secondary | ICD-10-CM | POA: Diagnosis not present

## 2023-05-26 DIAGNOSIS — R599 Enlarged lymph nodes, unspecified: Secondary | ICD-10-CM | POA: Insufficient documentation

## 2023-05-26 DIAGNOSIS — I2510 Atherosclerotic heart disease of native coronary artery without angina pectoris: Secondary | ICD-10-CM | POA: Insufficient documentation

## 2023-05-26 DIAGNOSIS — N183 Chronic kidney disease, stage 3 unspecified: Secondary | ICD-10-CM | POA: Diagnosis not present

## 2023-05-26 DIAGNOSIS — K219 Gastro-esophageal reflux disease without esophagitis: Secondary | ICD-10-CM | POA: Insufficient documentation

## 2023-05-26 DIAGNOSIS — K581 Irritable bowel syndrome with constipation: Secondary | ICD-10-CM | POA: Diagnosis not present

## 2023-05-26 DIAGNOSIS — K5730 Diverticulosis of large intestine without perforation or abscess without bleeding: Secondary | ICD-10-CM | POA: Insufficient documentation

## 2023-05-26 DIAGNOSIS — K635 Polyp of colon: Secondary | ICD-10-CM | POA: Diagnosis not present

## 2023-05-26 DIAGNOSIS — I509 Heart failure, unspecified: Secondary | ICD-10-CM | POA: Diagnosis not present

## 2023-05-26 DIAGNOSIS — I480 Paroxysmal atrial fibrillation: Secondary | ICD-10-CM | POA: Diagnosis not present

## 2023-05-26 DIAGNOSIS — I130 Hypertensive heart and chronic kidney disease with heart failure and stage 1 through stage 4 chronic kidney disease, or unspecified chronic kidney disease: Secondary | ICD-10-CM | POA: Diagnosis not present

## 2023-05-26 DIAGNOSIS — D122 Benign neoplasm of ascending colon: Secondary | ICD-10-CM

## 2023-05-26 DIAGNOSIS — K31A19 Gastric intestinal metaplasia without dysplasia, unspecified site: Secondary | ICD-10-CM

## 2023-05-26 DIAGNOSIS — K562 Volvulus: Secondary | ICD-10-CM | POA: Diagnosis not present

## 2023-05-26 DIAGNOSIS — Z7901 Long term (current) use of anticoagulants: Secondary | ICD-10-CM | POA: Diagnosis not present

## 2023-05-26 HISTORY — PX: ESOPHAGOGASTRODUODENOSCOPY: SHX5428

## 2023-05-26 HISTORY — PX: POLYPECTOMY: SHX149

## 2023-05-26 HISTORY — PX: SUBMUCOSAL INJECTION: SHX5543

## 2023-05-26 HISTORY — PX: COLONOSCOPY: SHX5424

## 2023-05-26 HISTORY — PX: HEMOSTASIS CLIP PLACEMENT: SHX6857

## 2023-05-26 LAB — HM COLONOSCOPY

## 2023-05-26 SURGERY — COLONOSCOPY
Anesthesia: General

## 2023-05-26 MED ORDER — PANTOPRAZOLE SODIUM 40 MG PO TBEC: 40.0000 mg | DELAYED_RELEASE_TABLET | Freq: Two times a day (BID) | ORAL | 1 refills | Status: DC

## 2023-05-26 MED ORDER — STERILE WATER FOR IRRIGATION IR SOLN
Status: DC | PRN
  Administered 2023-05-26: 180 mL

## 2023-05-26 MED ORDER — PROPOFOL 10 MG/ML IV BOLUS
INTRAVENOUS | Status: DC | PRN
  Administered 2023-05-26: 100 mg via INTRAVENOUS
  Administered 2023-05-26: 120 ug/kg/min via INTRAVENOUS

## 2023-05-26 MED ORDER — LIDOCAINE HCL (CARDIAC) PF 100 MG/5ML IV SOSY
PREFILLED_SYRINGE | INTRAVENOUS | Status: DC | PRN
Start: 2023-05-26 — End: 2023-05-26
  Administered 2023-05-26: 60 mg via INTRAVENOUS

## 2023-05-26 MED ORDER — LACTATED RINGERS IV SOLN: INTRAVENOUS | Status: DC | PRN

## 2023-05-26 MED ORDER — PHENYLEPHRINE 80 MCG/ML (10ML) SYRINGE FOR IV PUSH (FOR BLOOD PRESSURE SUPPORT)
PREFILLED_SYRINGE | INTRAVENOUS | Status: DC | PRN
  Administered 2023-05-26: 160 ug via INTRAVENOUS
  Administered 2023-05-26: 80 ug via INTRAVENOUS
  Administered 2023-05-26 (×2): 160 ug via INTRAVENOUS
  Administered 2023-05-26: 80 ug via INTRAVENOUS
  Administered 2023-05-26 (×2): 160 ug via INTRAVENOUS

## 2023-05-26 NOTE — Op Note (Addendum)
 [facility] Patient Name: [name] Procedure Date: 05/26/2023 12:54 PM MRN: [id number] Date of Birth: 1940/06/13 Attending MD: Katrinka Blazing , , [id number] CSN: [id number] Age: 83 Admit Type: Outpatient Procedure:                Colonoscopy Indications:              Abdominal pain, Constipation Providers:                Katrinka Blazing, Sheran Fava, Lennice Sites                            Technician, Technician Referring MD:              Medicines:                Monitored Anesthesia Care Complications:            No immediate complications. Estimated Blood Loss:     Estimated blood loss: none. Procedure:                Pre-Anesthesia Assessment:                           - Prior to the procedure, a History and Physical                            was performed, and patient medications, allergies                            and sensitivities were reviewed. The patient's                            tolerance of previous anesthesia was reviewed.                           - The risks and benefits of the procedure and the                            sedation options and risks were discussed with the                            patient. All questions were answered and informed                            consent was obtained.                           - ASA Grade Assessment: III - A patient with severe                            systemic disease.                           After obtaining informed consent, the colonoscope                            was passed under direct vision. Throughout the  procedure, the patient's blood pressure, pulse, and                            oxygen saturations were monitored continuously. The                            [id number]-[id number] ([id number]) was introduced through the                            anus and advanced to the the cecum, identified by                            appendiceal orifice and ileocecal valve. The                             patient tolerated the procedure well. The                            colonoscopy was technically difficult and complex                            due to significant looping. Successful completion                            of the procedure was aided by applying abdominal                            pressure. The quality of the bowel preparation was                            adequate to identify polyps greater than 5 mm in                            size. Scope In: 1:25:04 PM Scope Out: 2:26:39 PM Scope Withdrawal Time: 0 hours 44 minutes 46 seconds  Total Procedure Duration: 1 hour 1 minute 35 seconds  Findings:      The perianal and digital rectal examinations were normal.      Two sessile polyps were found in the cecum. The polyps were 4 to 6 mm in       size. These polyps were removed with a cold snare. Resection and       retrieval were complete.      A 15 mm polyp was found in the ascending colon. The polyp was       carpet-like. Area was successfully injected with 5 mL Eleview for a lift       polypectomy. Imaging was performed using white light and narrow band       imaging to visualize the mucosa and demarcate the polyp site after       injection for EMR purposes. The polyp was removed with a cold snare.       Resection and retrieval were complete. To prevent bleeding after the       polypectomy, two hemostatic clips were successfully placed (MR safe).       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 There was no bleeding at the end  of the procedure.      Seven sessile polyps were found in the transverse colon and ascending       colon. The polyps were 3 to 10 mm in size. These polyps were removed       with a cold snare. Resection and retrieval were complete.      The hepatic flexure revealed significantly excessive looping.       Intermittent abdominal pressure was applied in the epigastric area and       RUQ, as well as by using the scope stiffener.       Scattered medium-mouthed diverticula were found in the transverse colon       and ascending colon.      Non-bleeding internal hemorrhoids were found during retroflexion. The       hemorrhoids were small. Impression:               - Two 4 to 6 mm polyps in the cecum, removed with a                            cold snare. Resected and retrieved.                           - One 15 mm polyp in the ascending colon, removed                            with a cold snare. Resected via EMR and retrieved.                            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 Clips (MR                            safe) were placed.                           - Seven 3 to 10 mm polyps in the transverse colon                            and in the ascending colon, removed with a cold                            snare. Resected and retrieved.                           - There was significant looping of the colon.                           - Diverticulosis in the transverse colon and in the                            ascending colon.                           - Non-bleeding internal hemorrhoids. Moderate Sedation:      Per Anesthesia Care Recommendation:           - Discharge patient to home (ambulatory).                           -  Resume previous diet.                           - Await pathology results.                           - Continue present medications.                           - Restart Eliquis in 2 days (05/29/2023)                           - Repeat colonoscopy in 1 year for surveillance. Procedure Code(s):        --- Professional ---                           [phone], 59, Colonoscopy, flexible; with removal of                            tumor(s), polyp(s), or other lesion(s) by snare                            technique                           45381, Colonoscopy, flexible; with directed                            submucosal injection(s), any substance Diagnosis Code(s):        --- Professional ---                            D12.0, Benign neoplasm of cecum                           D12.3, Benign neoplasm of transverse colon (hepatic                            flexure or splenic flexure)                           D12.2, Benign neoplasm of ascending colon                           K64.8, Other hemorrhoids                           R10.9, Unspecified abdominal pain                           K59.00, Constipation, unspecified                           K57.30, Diverticulosis of large intestine without                            perforation or abscess without bleeding CPT copyright 2022 American Medical Association. All rights reserved.  The codes documented in this report are preliminary and upon coder review may  be revised to meet current compliance requirements. Katrinka Blazing, MD Katrinka Blazing,  05/26/2023 2:36:59 PM This report has been signed electronically. Number of Addenda: 0

## 2023-05-26 NOTE — Discharge Instructions (Addendum)
 You are being discharged to home.  Resume your previous diet.  We are waiting for your pathology results.  Take Protonix (pantoprazole) 40 mg by mouth twice a day.  Restart Eliquis in 2 days (05/29/2023) Continue your present medications.  Your physician has recommended a repeat colonoscopy in one year for surveillance.

## 2023-05-26 NOTE — Anesthesia Postprocedure Evaluation (Signed)
 Anesthesia Post Note  Patient: [name]  Procedure(s) Performed: COLONOSCOPY EGD (ESOPHAGOGASTRODUODENOSCOPY) POLYPECTOMY, INTESTINE INJECTION, SUBMUCOSAL CONTROL OF HEMORRHAGE, GI TRACT, ENDOSCOPIC, BY CLIPPING OR OVERSEWING  Patient location during evaluation: PACU Anesthesia Type: General Level of consciousness: awake and alert Pain management: pain level controlled Vital Signs Assessment: post-procedure vital signs reviewed and stable Respiratory status: spontaneous breathing, nonlabored ventilation, respiratory function stable and patient connected to nasal cannula oxygen Cardiovascular status: blood pressure returned to baseline and stable Postop Assessment: no apparent nausea or vomiting Anesthetic complications: no   There were no known notable events for this encounter.   Last Vitals:  Vitals:   05/26/23 1233 05/26/23 1431  BP: (!) 158/72 (!) 112/42  Pulse:  78  Resp: 19 19  Temp: 37.1 C 36.7 C  SpO2: 97% 97%    Last Pain:  Vitals:   05/26/23 1431  TempSrc: Oral  PainSc: 0-No pain                 Sanya Kobrin L Ziyanna Tolin

## 2023-05-26 NOTE — Anesthesia Preprocedure Evaluation (Signed)
 Anesthesia Evaluation  Patient identified by MRN, date of birth, ID band Patient awake    Reviewed: Allergy & Precautions, NPO status , Patient's Chart, lab work & pertinent test results  Airway Mallampati: II  TM Distance: >3 FB Neck ROM: Full    Dental  (+) Missing, Chipped, Poor Dentition,    Pulmonary neg pulmonary ROS   Pulmonary exam normal breath sounds clear to auscultation       Cardiovascular hypertension, Pt. on medications + CAD and +CHF  Normal cardiovascular exam+ dysrhythmias Atrial Fibrillation  Rhythm:Regular Rate:Normal  ECHO: Left ventricular ejection fraction, by estimation, is 50 to 55%. The left ventricle has low normal function. The left ventricle has no regional wall motion abnormalities. There is mild left ventricular hypertrophy of the basal-septal segment. Left ventricular diastolic parameters are consistent with Grade I diastolic dysfunction (impaired relaxation). Elevated left atrial pressure. Right ventricular systolic function is normal. The right ventricular size is normal. Left atrial size was mildly dilated. The mitral valve is normal in structure. No evidence of mitral valve regurgitation. No evidence of mitral stenosis. The aortic valve is calcified. Aortic valve regurgitation is mild. No aortic stenosis is present. Aortic dilatation noted. There is borderline dilatation of the aortic root, measuring 38 mm.   Neuro/Psych  Neuromuscular disease CVA, No Residual Symptoms  negative psych ROS   GI/Hepatic Neg liver ROS,GERD  ,,Schatzki's ring   Endo/Other  negative endocrine ROS    Renal/GU Renal diseaseStage 3 CKD     Musculoskeletal  (+) Arthritis ,    Abdominal  (+) + obese  Peds  Hematology negative hematology ROS (+)   Anesthesia Other Findings CAD  Reproductive/Obstetrics                             Anesthesia Physical Anesthesia Plan  ASA:  4  Anesthesia Plan: General   Post-op Pain Management: Minimal or no pain anticipated   Induction: Intravenous  PONV Risk Score and Plan: Propofol infusion  Airway Management Planned: Nasal Cannula and Natural Airway  Additional Equipment:   Intra-op Plan:   Post-operative Plan:   Informed Consent: I have reviewed the patients History and Physical, chart, labs and discussed the procedure including the risks, benefits and alternatives for the proposed anesthesia with the patient or authorized representative who has indicated his/her understanding and acceptance.     Dental advisory given  Plan Discussed with: CRNA  Anesthesia Plan Comments:         Anesthesia Quick Evaluation

## 2023-05-26 NOTE — Interval H&P Note (Signed)
 History and Physical Interval Note:  05/26/2023 11:08 AM  [name]  has presented today for surgery, with the diagnosis of EPIGASTRIC PAIN, CONSTIPATION, LOWER ABDOMINAL PAIN.  The various methods of treatment have been discussed with the patient and family. After consideration of risks, benefits and other options for treatment, the patient has consented to  Procedure(s) with comments: COLONOSCOPY (N/A) - 1:45PM;ASA 3 EGD (ESOPHAGOGASTRODUODENOSCOPY) (N/A) - 1:45PM;ASA 3 as a surgical intervention.  The patient's history has been reviewed, patient examined, no change in status, stable for surgery.  I have reviewed the patient's chart and labs.  Questions were answered to the patient's satisfaction.     [name]

## 2023-05-26 NOTE — Transfer of Care (Signed)
 Immediate Anesthesia Transfer of Care Note  Patient: [name]  Procedure(s) Performed: COLONOSCOPY EGD (ESOPHAGOGASTRODUODENOSCOPY) POLYPECTOMY, INTESTINE INJECTION, SUBMUCOSAL CONTROL OF HEMORRHAGE, GI TRACT, ENDOSCOPIC, BY CLIPPING OR OVERSEWING  Patient Location: PACU  Anesthesia Type:General  Level of Consciousness: awake, alert , and oriented  Airway & Oxygen Therapy: Patient Spontanous Breathing  Post-op Assessment: Report given to RN  Post vital signs: Reviewed and stable  Last Vitals:  Vitals Value Taken Time  BP 112/42 05/26/23 1431  Temp    Pulse 81 05/26/23 1433  Resp 20 05/26/23 1433  SpO2 97 % 05/26/23 1433  Vitals shown include unfiled device data.  Last Pain:  Vitals:   05/26/23 1309  PainSc: 0-No pain         Complications: No notable events documented.

## 2023-05-26 NOTE — Op Note (Addendum)
 [facility] Patient Name: [name] Procedure Date: 05/26/2023 12:58 PM MRN: [id number] Date of Birth: December 26, 1940 Attending MD: Katrinka Blazing , , [id number] CSN: [id number] Age: 83 Admit Type: Outpatient Procedure:                Upper GI endoscopy Indications:              Upper abdominal pain Providers:                Katrinka Blazing, Tiburcio Pea                            Technician, Technician Referring MD:              Medicines:                Monitored Anesthesia Care Complications:            No immediate complications. Estimated Blood Loss:     Estimated blood loss: none. Procedure:                Pre-Anesthesia Assessment:                           - Prior to the procedure, a History and Physical                            was performed, and patient medications, allergies                            and sensitivities were reviewed. The patient's                            tolerance of previous anesthesia was reviewed.                           - The risks and benefits of the procedure and the                            sedation options and risks were discussed with the                            patient. All questions were answered and informed                            consent was obtained.                           After obtaining informed consent, the endoscope was                            passed under direct vision. Throughout the                            procedure, the patient's blood pressure, pulse, and                            oxygen saturations were monitored continuously. The  [id number]-[id number] ([id number]) scope was introduced through the                            mouth, and advanced to the second part of duodenum.                            The upper GI endoscopy was accomplished without                            difficulty. The patient tolerated the procedure                            well. Scope In: 1:11:58  PM Scope Out: 1:20:22 PM Total Procedure Duration: 0 hours 8 minutes 24 seconds  Findings:      The esophagus was normal.      The entire examined stomach was normal. Biopsies were taken with a cold       forceps for Helicobacter pylori testing.      Many non-bleeding cratered and superficial duodenal ulcers with a clean       ulcer base (Forrest Class III) were found in the entire duodenum. The       largest lesion was 8 mm in largest dimension. Biopsies were taken with a       cold forceps for histology. Impression:               - Normal esophagus.                           - Normal stomach. Biopsied.                           - Non-bleeding duodenal ulcers with a clean ulcer                            base (Forrest Class III). Biopsied. Moderate Sedation:      Per Anesthesia Care Recommendation:           - Discharge patient to home (ambulatory).                           - Resume previous diet.                           - Await pathology results.                           - Use Protonix (pantoprazole) 40 mg PO BID.                           - If normal biopsies, proceed with CT angio A/P. Procedure Code(s):        --- Professional ---                           [phone], Esophagogastroduodenoscopy, flexible,                            transoral; with biopsy, single or  multiple Diagnosis Code(s):        --- Professional ---                           K26.9, Duodenal ulcer, unspecified as acute or                            chronic, without hemorrhage or perforation                           R10.10, Upper abdominal pain, unspecified CPT copyright 2022 [employer]. All rights reserved. The codes documented in this report are preliminary and upon coder review may  be revised to meet current compliance requirements. Katrinka Blazing, MD Katrinka Blazing,  05/26/2023 1:24:02 PM This report has been signed electronically. Number of Addenda: 0

## 2023-05-29 ENCOUNTER — Encounter (HOSPITAL_COMMUNITY): Payer: Self-pay | Admitting: Gastroenterology

## 2023-05-29 ENCOUNTER — Encounter (INDEPENDENT_AMBULATORY_CARE_PROVIDER_SITE_OTHER): Payer: Self-pay | Admitting: *Deleted

## 2023-05-30 LAB — SURGICAL PATHOLOGY

## 2023-05-31 ENCOUNTER — Encounter (INDEPENDENT_AMBULATORY_CARE_PROVIDER_SITE_OTHER): Payer: Self-pay | Admitting: *Deleted

## 2023-06-08 ENCOUNTER — Ambulatory Visit: Payer: PPO | Admitting: Urology

## 2023-06-26 ENCOUNTER — Ambulatory Visit: Payer: PPO

## 2023-06-26 DIAGNOSIS — I495 Sick sinus syndrome: Secondary | ICD-10-CM | POA: Diagnosis not present

## 2023-06-26 LAB — CUP PACEART REMOTE DEVICE CHECK
Battery Remaining Longevity: 110 mo
Battery Remaining Percentage: 87 %
Battery Voltage: 3.01 V
Brady Statistic AP VP Percent: 1.9 %
Brady Statistic AP VS Percent: 72 %
Brady Statistic AS VP Percent: 1.3 %
Brady Statistic AS VS Percent: 20 %
Brady Statistic RA Percent Paced: 68 %
Brady Statistic RV Percent Paced: 3.2 %
Date Time Interrogation Session: 20250505020026
Implantable Lead Connection Status: 753985
Implantable Lead Connection Status: 753985
Implantable Lead Implant Date: 20231101
Implantable Lead Implant Date: 20231101
Implantable Lead Location: 753859
Implantable Lead Location: 753860
Implantable Pulse Generator Implant Date: 20231101
Lead Channel Impedance Value: 430 Ohm
Lead Channel Impedance Value: 480 Ohm
Lead Channel Pacing Threshold Amplitude: 0.375 V
Lead Channel Pacing Threshold Amplitude: 0.75 V
Lead Channel Pacing Threshold Pulse Width: 0.5 ms
Lead Channel Pacing Threshold Pulse Width: 0.5 ms
Lead Channel Sensing Intrinsic Amplitude: 12 mV
Lead Channel Sensing Intrinsic Amplitude: 5 mV
Lead Channel Setting Pacing Amplitude: 1 V
Lead Channel Setting Pacing Amplitude: 1.375
Lead Channel Setting Pacing Pulse Width: 0.5 ms
Lead Channel Setting Sensing Sensitivity: 2 mV
Pulse Gen Model: 2272
Pulse Gen Serial Number: 8115159

## 2023-06-27 ENCOUNTER — Encounter: Payer: Self-pay | Admitting: Internal Medicine

## 2023-07-06 ENCOUNTER — Encounter: Payer: Self-pay | Admitting: Urology

## 2023-07-06 ENCOUNTER — Ambulatory Visit: Payer: PPO | Admitting: Urology

## 2023-07-06 VITALS — BP 137/57 | HR 59

## 2023-07-06 DIAGNOSIS — Z87440 Personal history of urinary (tract) infections: Secondary | ICD-10-CM

## 2023-07-06 DIAGNOSIS — Z8546 Personal history of malignant neoplasm of prostate: Secondary | ICD-10-CM

## 2023-07-06 DIAGNOSIS — N3941 Urge incontinence: Secondary | ICD-10-CM | POA: Diagnosis not present

## 2023-07-06 DIAGNOSIS — R351 Nocturia: Secondary | ICD-10-CM

## 2023-07-06 DIAGNOSIS — N3020 Other chronic cystitis without hematuria: Secondary | ICD-10-CM

## 2023-07-06 DIAGNOSIS — N99112 Postprocedural membranous urethral stricture: Secondary | ICD-10-CM | POA: Diagnosis not present

## 2023-07-06 LAB — URINALYSIS, ROUTINE W REFLEX MICROSCOPIC
Bilirubin, UA: NEGATIVE
Glucose, UA: NEGATIVE
Ketones, UA: NEGATIVE
Leukocytes,UA: NEGATIVE
Nitrite, UA: NEGATIVE
RBC, UA: NEGATIVE
Specific Gravity, UA: 1.02 (ref 1.005–1.030)
Urobilinogen, Ur: 1 mg/dL (ref 0.2–1.0)
pH, UA: 6 (ref 5.0–7.5)

## 2023-07-06 MED ORDER — TRIMETHOPRIM 100 MG PO TABS: 100.0000 mg | ORAL_TABLET | Freq: Every day | ORAL | 3 refills | Status: AC

## 2023-07-06 NOTE — Progress Notes (Unsigned)
 Subjective:  1. History of prostate cancer   2. Chronic cystitis   3. Urge incontinence   4. Postprocedural membranous urethral stricture   5. Nocturia     01/31/20: [name] returns today in f/u from recent imaging.  He had a CT for the abdominal pain he had at his last visit and was found to have some small but slightly larger left iliac adenopathy.  He then had a PSMA PET that showed mild uptake in the nodes and this is felt to be either indolent nodal metastases vs slightly enlarged benign nodes.  His PSA remains <0.1 in 10/21.  He has continue to have some abdominal discomfort and has seen GI but a cause has not been clearly determined.  He has chronic constipation.    He is having SUI and uses paper towel.  He has urgency with UUI that has progressed.  He has nocturia x 3.   He had previously been on Oxybutynin and he was given samples of Gemtesa  which helped a little bit.  He had UDS in the past that showed a bladder with some instability.   1/5/23Joanette [name] returns today in f/u.  His PSA prior to this visit remains undetectible.  A repeat CT [facility] on 01/26/21 showed no further adenopathy or other concerning findings.  He is going to have a CABG on 03/09/21 by Dr. Deloise Ferries.  He had been having DOE.  He has persistent MUI.  He didn't get much benefit from oxybutynin or myrbetriq.  He is using a paper towel in the underwear and has moderate nocturia.  He has had no hematuria.  He had a seed implant remotely.    1/4/24Joanette [name] returns today in f/u. His PSA remains <0.1.  Chest CT in 7/23 showed no concerning findings.  He has a history of MUI with DI. He had a CABG in 1/23. He remains on Eliquis . He had to have a pacemaker in November.  He has stable LUTS with an IPSS of 20 with nocturia 4x and some intermittency and dribbling. He has some MUI.  He has occ dysuria but no hematuria. He had a DVT in the left arm and the Eliquis  dose was increased.  His UA has >30 WBC, 3-10 RBC and moderate bacteria.  He  has CKD 3b with a Cr of 1.73 in 10/23.  2/1/24Joanette [name] returns today in f/u following treatment of a UTI.  His culture grew serratia and he was switched from keflex  to bactrim .   He has the occasional sharp pain in the penis.  He has persistent frequency and nocturia x 4.  He has some urgency with occasional UUI.  He has no hematuria. His Cr on 03/02/22 was 1.85.  He got a little better with the bactrim  but his UA looks infected today.  He has a single stone remotely.    3/28/24Joanette [name] returns today in f/u from a CT scan for cystoscopy.  The CT showed no obvious cause of the UTI's or hematuria. There was a punctate renal stone.  He remains on TMP for suppression.  He has 3-10 RBC's on UA today.  His PSA was <0.1 on 03/24/22. He had some stable enlarged lymph nodes and borderline dilation of the pulmonary trunk on the CT as well.   10/24/24Joanette [name] returns today in f/u.  He had been on TMP suppression but ran out about a month ago.  He had the onset yesterday of dysuria and it has worsened today.  His  UA had 11-30 WBC and 3-10 RBC's with many bacteria.   He has had no fever or chills.  He has increased frequency and urgency.  He can have UUI.  He has a variable stream.   HIs IPSS is 28 with nocturia x 4.   12/29/22: [name] returns today in f/u.   He has been off of the bactrim  for a week and his UA is clear.  His symptoms are somewhat better.  He still has nocturia up to 4x.    1/16/25Joanette [name] returns today in f/u.  His PSA remains undetectible on 02/23/23.  His UA has 6-10 WBC's with few bacteria.  He remains on TMP or suppression.  His IPSS is 14 with nocturia x 3 and urgency.   5/15/25Joanette [name] returns today in f/u. He remains on Gemtesa  but continues to have some UUI/insensible incontinence.  He doesn't clearly report SUI.  His IPSS is 6 with nocturia x 3 and intermittency.  His UA is clear on TMP suppression.  Cysto in 3/24 showed mild membranous stricturing and radiation changes in the prostate from his prior seed implant  at [facility] by Dr. Merlyn Starring in around 2001.   He has had no hematuria.         IPSS     Row Name 07/06/23 1000         International Prostate Symptom Score   How often have you had the sensation of not emptying your bladder? Not at All     How often have you had to urinate less than every two hours? Not at All     How often have you found you stopped and started again several times when you urinated? About half the time     How often have you found it difficult to postpone urination? Not at All     How often have you had a weak urinary stream? Not at All     How often have you had to strain to start urination? Not at All     How many times did you typically get up at night to urinate? 3 Times     Total IPSS Score 6       Quality of Life due to urinary symptoms   If you were to spend the rest of your life with your urinary condition just the way it is now how would you feel about that? Mostly Disatisfied                  ROS:  ROS:  A complete review of systems was performed.  All systems are negative except for pertinent findings as noted.   Review of Systems  Constitutional:  Negative for chills and fever.  Genitourinary:  Positive for frequency and urgency.  Musculoskeletal:  Positive for joint pain.  Endo/Heme/Allergies:  Bruises/bleeds easily.    No Known Allergies  Outpatient Encounter Medications as of 07/06/2023  Medication Sig Note   acetaminophen  (TYLENOL ) 500 MG tablet Take 500 mg by mouth every 6 (six) hours as needed (for pain.).    amLODipine  (NORVASC ) 2.5 MG tablet Take 1 tablet (2.5 mg total) by mouth daily.    apixaban  (ELIQUIS ) 2.5 MG TABS tablet Take 1 tablet (2.5 mg total) by mouth 2 (two) times daily. 05/24/2023: Last dose was 4/1- per patient   atorvastatin  (LIPITOR) 20 MG tablet TAKE ONE (1) TABLET BY MOUTH EVERY DAY    Docusate Sodium  (COLACE PO) Take by mouth. Takes 3 BID.  ezetimibe (ZETIA) 10 MG tablet Take 10 mg by mouth at bedtime.     lubiprostone  (AMITIZA ) 8 MCG capsule Take 1 capsule (8 mcg total) by mouth 2 (two) times daily with a meal.    pantoprazole  (PROTONIX ) 40 MG tablet Take 1 tablet (40 mg total) by mouth 2 (two) times daily.    trimethoprim  (TRIMPEX ) 100 MG tablet Take 1 tablet (100 mg total) by mouth at bedtime.    Vibegron  (GEMTESA ) 75 MG TABS Take 1 tablet (75 mg total) by mouth daily.    [DISCONTINUED] trimethoprim  (TRIMPEX ) 100 MG tablet Take 1 tablet (100 mg total) by mouth at bedtime.    No facility-administered encounter medications on file as of 07/06/2023.    Past Medical History:  Diagnosis Date   Arthritis    CAD (coronary artery disease)    Two-vessel obstructive disease November 2022; CABG with LIMA to LAD and SVG to PDA January 2023   Chronic back pain    CKD (chronic kidney disease) stage 3, GFR 30-59 ml/min (HCC)    Diastolic dysfunction    a. 10/2021 Echo: EF 55-60%, no rwma, GrI DD, nl RV fxn. Mildly dil LA. Triv MR/AI. AoV sclerosis w/o stenosis. Asc Ao 38mm.   Essential hypertension    GERD (gastroesophageal reflux disease)    Helicobacter pylori gastritis    Treated   History of kidney stones    Hx of adenomatous colonic polyps    IBS (irritable bowel syndrome)    PAF (paroxysmal atrial fibrillation) (HCC)    Prostate CA (HCC)    Seed implants   Schatzki's ring    Sinus node dysfunction (HCC)    a. 12/2021 s/p SJM PPM.   Stroke (HCC)    left hand numbness at times    Past Surgical History:  Procedure Laterality Date   APPENDECTOMY  age 43   BACK SURGERY  03/24/2006   [facility]   BACK SURGERY  09/24/2004   lumbar, [facility]   BACK SURGERY  09/18/2002   [facility]   BILIARY STENT PLACEMENT  01/07/2012   Procedure: BILIARY STENT PLACEMENT;  Surgeon: Ruby Corporal, MD;  Location: [facility];  Service: Endoscopy;  Laterality: N/A;   BILIARY STENT PLACEMENT  02/21/2012   Procedure: BILIARY STENT PLACEMENT;  Surgeon: Ruby Corporal, MD;  Location: [facility];  Service: Endoscopy;  Laterality:  N/A;  Biliary Stent Replacement   CATARACT EXTRACTION W/PHACO  11/22/2010   Procedure: CATARACT EXTRACTION PHACO AND INTRAOCULAR LENS PLACEMENT (IOC);  Surgeon: Clay Cummins;  Location: [facility];  Service: Ophthalmology;  Laterality: Right;  CDE: 6.81   CHOLECYSTECTOMY  03/04/2005   [facility], [facility]. Gangrenous cholecystitis complicated by abscess requiring percutaneous drainage. He also had common duct stones requiring ERCP and sphincterotomy.   COLONOSCOPY  09/22/2003   Scattered sigmoid diverticulosis, splenic flexure polyp. Hyperplastic   COLONOSCOPY  02/22/1995   3 cm tubular adenoma and a sending colon   COLONOSCOPY  02/01/2011   Rourk-friable anal canal, hyperplastic rectal polyp   COLONOSCOPY N/A 08/15/2013   Procedure: COLONOSCOPY;  Surgeon: Ruby Corporal, MD;  [facility]: [facility];  Service: Endoscopy;  Laterality: N/A;  100   COLONOSCOPY N/A 09/20/2018   Procedure: COLONOSCOPY;  Surgeon: Ruby Corporal, MD;  [facility]: [facility];  Service: Endoscopy;  Laterality: N/A;  12:00pm   COLONOSCOPY N/A 05/26/2023   Procedure: COLONOSCOPY;  Surgeon: Urban Garden, MD;  Location: [facility];  Service: Gastroenterology;  Laterality: N/A;  1:45PM;ASA 3  CORONARY ARTERY BYPASS GRAFT N/A 03/09/2021   Procedure: OFF PUMP CORONARY ARTERY BYPASS GRAFTING (CABG) X2, USING LEFT INTERNAL MAMMARY ARTERY AND RIGHT LEG GREATER SAPHENOUS VEIN HARVESTED ENDOSCOPICALLY;  Surgeon: Hilarie Lovely, MD;  Location: [facility] OR;  Service: Open Heart Surgery;  Laterality: N/A;  X 2   ERCP  03/02/2005   [facility], R[facility]k. Normal-appearing biliary tree (gallbladder not image), status post sphincterotomy with recovery of small pieces of stone material, status post balloon occlusion cholangiogram.   ERCP  01/07/2012   Procedure: ENDOSCOPIC RETROGRADE CHOLANGIOPANCREATOGRAPHY (ERCP);  Surgeon: Ruby Corporal, MD;  Location: [facility];  Service: Endoscopy;  Laterality: N/A;  possible biliary stenting   ERCP   02/21/2012   Procedure: ENDOSCOPIC RETROGRADE CHOLANGIOPANCREATOGRAPHY (ERCP);  Surgeon: Ruby Corporal, MD;  Location: [facility];  Service: Endoscopy;  Laterality: N/A;  common bile duct stone and clip removed   ERCP N/A 05/18/2012   Procedure: ENDOSCOPIC RETROGRADE CHOLANGIOPANCREATOGRAPHY (ERCP);  Surgeon: Ruby Corporal, MD;  Location: [facility];  Service: Endoscopy;  Laterality: N/A;  stone and debri removal   ESOPHAGOGASTRODUODENOSCOPY  09/22/2003   Erosive esophagitis and Schatzki ring, small hiatal hernia   ESOPHAGOGASTRODUODENOSCOPY  01/22/2008   Mild reflux esophagitis, small hiatal hernia   ESOPHAGOGASTRODUODENOSCOPY  02/01/2011   Rourk-erosive reflux esophagitis, small hiatal hernia   ESOPHAGOGASTRODUODENOSCOPY  10/26/2011   Dr. Zenia Hight gastric submucosal petechia (bx-benign ulceration), juxta ampullary duodenal diverticulum and some mucosal edema involving 1st/2nd portion of duodenum with superficial erosions (bx-superficial ulceration/benign)   ESOPHAGOGASTRODUODENOSCOPY  02/21/2012   Procedure: ESOPHAGOGASTRODUODENOSCOPY (EGD);  Surgeon: Ruby Corporal, MD;  [facility]: [facility];  Service: Endoscopy;  Laterality: N/A;   ESOPHAGOGASTRODUODENOSCOPY N/A 05/26/2023   Procedure: EGD (ESOPHAGOGASTRODUODENOSCOPY);  Surgeon: Umberto Ganong, Bearl Limes, MD;  [facility]: [facility];  Service: Gastroenterology;  Laterality: N/A;  1:45PM;ASA 3   EYE SURGERY     HEMOSTASIS CLIP PLACEMENT  05/26/2023   Procedure: CONTROL OF HEMORRHAGE, GI TRACT, ENDOSCOPIC, BY CLIPPING OR OVERSEWING;  Surgeon: Umberto Ganong, Bearl Limes, MD;  Location: [facility];  Service: Gastroenterology;;   Incomplete colonoscopy  01/22/2008   Left-sided diverticula, mid descending colon polyp, due to recurrent looping and redundancy exam was incomplete. It was felt that the mid colon was reached. Followup barium enema showed colon interposition between the liver and the diaphragm, redundant sigmoid colon but no colon mass or  polyp identified.. Pathology revealed tubular adenoma.   INSERT / REPLACE / REMOVE PACEMAKER     LEFT HEART CATH AND CORONARY ANGIOGRAPHY N/A 01/18/2021   Procedure: LEFT HEART CATH AND CORONARY ANGIOGRAPHY;  Surgeon: Avanell Leigh, MD;  [facility]: [facility];  Service: Cardiovascular;  Laterality: N/A;   PACEMAKER IMPLANT N/A 12/22/2021   Procedure: PACEMAKER IMPLANT;  Surgeon: Tammie Fall, MD;  [facility]: [facility];  Service: Cardiovascular;  Laterality: N/A;   POLYPECTOMY  09/20/2018   Procedure: POLYPECTOMY;  Surgeon: Ruby Corporal, MD;  [facility];  Service: Endoscopy;;  Hepatic flexure polyp cold snare, splenic flexure polyp   POLYPECTOMY  05/26/2023   Procedure: POLYPECTOMY, INTESTINE;  Surgeon: Urban Garden, MD;  [facility];  Service: Gastroenterology;;   [address]  01/07/2012   Procedure: [address];  Surgeon: Ruby Corporal, MD;  Location: [facility];  Service: Endoscopy;  Laterality: N/A;  Extended   SPYGLASS CHOLANGIOSCOPY  02/21/2012   Procedure: SPYGLASS CHOLANGIOSCOPY;  Surgeon: Ruby Corporal, MD;  Location: [facility];  Service: Endoscopy;  Laterality: N/A;  SPYGLASS CHOLANGIOSCOPY N/A 05/18/2012   Procedure: SPYGLASS CHOLANGIOSCOPY;  Surgeon: Ruby Corporal, MD;  Location: [facility];  Service: Endoscopy;  Laterality: N/A;   SUBMUCOSAL INJECTION  05/26/2023   Procedure: INJECTION, SUBMUCOSAL;  Surgeon: Umberto Ganong, Bearl Limes, MD;  [facility];  Service: Gastroenterology;;   TEE WITHOUT CARDIOVERSION N/A 03/09/2021   Procedure: TRANSESOPHAGEAL ECHOCARDIOGRAM (TEE);  Surgeon: Hilarie Lovely, MD;  Location: [facility] OR;  Service: Open Heart Surgery;  Laterality: N/A;   TOTAL KNEE ARTHROPLASTY  03/21/2012   Procedure: TOTAL KNEE ARTHROPLASTY;  Surgeon: Ilean Mall, MD;  Location: [facility];  Service: Orthopedics;  Laterality: Right;  right knee arthroplasty   TOTAL KNEE ARTHROPLASTY Left 04/08/2019    Procedure: LEFT TOTAL KNEE ARTHROPLASTY;  Surgeon: Wendolyn Hamburger, MD;  Location: [facility];  Service: Orthopedics;  Laterality: Left;    Social History   Socioeconomic History   Marital status: Married    Spouse name: Not on file   Number of children: 2   Years of education: Not on file   Highest education level: Not on file  Occupational History   Occupation: Retired Careers information officer: RETIRED  Tobacco Use   Smoking status: Never   Smokeless tobacco: Never  Vaping Use   Vaping status: Never Used  Substance and Sexual Activity   Alcohol use: No    Alcohol/week: 0.0 standard drinks of alcohol   Drug use: No   Sexual activity: Not on file  Other Topics Concern   Not on file  Social History Narrative   Not on file   Social Drivers of Health   Financial Resource Strain: Not on file  Food Insecurity: Not on file  Transportation Needs: Not on file  Physical Activity: Not on file  Stress: Not on file  Social Connections: Unknown (10/12/2022)   [facility]   Social Network    Social Network: Not on file  Intimate Partner Violence: Unknown (10/12/2022)   Received from [facility]   HITS    Physically Hurt: Not on file    Insult or Talk Down To: Not on file    Threaten Physical Harm: Not on file    Scream or Curse: Not on file    Family History  Problem Relation Age of Onset   Diabetes Mother    Coronary artery disease Mother    Coronary artery disease Father    Diabetes Father    Stomach cancer Father    Deep vein thrombosis Daughter    Hypotension Neg Hx    Anesthesia problems Neg Hx    Malignant hyperthermia Neg Hx    Pseudochol deficiency Neg Hx    Colon cancer Neg Hx        Objective: Vitals:   07/06/23 1030  BP: (!) 137/57  Pulse: (!) 59     Physical Exam Vitals reviewed.  Constitutional:      Appearance: Normal appearance.  Neurological:     Mental Status: He is alert.     Lab Results:  Results for orders placed or  performed in visit on 07/06/23 (from the past 24 hours)  Urinalysis, Routine w reflex microscopic     Status: None   Collection Time: 07/06/23 10:33 AM  Result Value Ref Range   Specific Gravity, UA 1.020 1.005 - 1.030   pH, UA 6.0 5.0 - 7.5   Color, UA Yellow Yellow   Appearance Ur Clear Clear   Leukocytes,UA Negative Negative   Protein,UA Trace  Negative/Trace   Glucose, UA Negative Negative   Ketones, UA Negative Negative   RBC, UA Negative Negative   Bilirubin, UA Negative Negative   Urobilinogen, Ur 1.0 0.2 - 1.0 mg/dL   Nitrite, UA Negative Negative   Microscopic Examination Comment    Narrative   Performed at:  [address] - [facility] [address] [place], [place]  [id number] Lab Director: Liliana Regulus MT, Phone:  [phone]          BMET No results for input(s): "NA", "K", "CL", "CO2", "GLUCOSE", "BUN", "CREATININE", "CALCIUM " in the last 72 hours. PSA <0.1 in 9/20. <0.1 in 10/21 <0.1 in 12/22 <0.1 in 12/23 UA reviewed.  Clear Culture grew proteus.   Lab Results  Component Value Date   PSA1 <0.1 02/23/2023   PSA1 <0.1 03/24/2022   PSA1 <0.1 02/17/2022    Studies/Results: CUP PACEART REMOTE DEVICE CHECK Result Date: 06/26/2023 PPM Scheduled remote reviewed. Normal device function.  Presenting rhythm:  AP/VS 2 AMS EGM's, both <6min, PAT Next remote 91 days. LA, CVRS  MR Shoulder Left Wo Contrast Result Date: 05/02/2023 CLINICAL DATA:  Left shoulder pain EXAM: MRI OF THE LEFT SHOULDER WITHOUT CONTRAST TECHNIQUE: Multiplanar, multisequence MR imaging of the shoulder was performed. No intravenous contrast was administered. COMPARISON:  None Available. FINDINGS: Rotator cuff: Complete full-thickness, full width tear of the supraspinatus tendon with 16 mm of retraction. Mild infraspinatus tendinosis. Teres minor tendon is intact. Moderate subscapularis tendinosis. Muscles: No muscle atrophy or edema. No intramuscular fluid collection or hematoma. Biceps Long  Head: Moderate tendinosis of the intra-articular portion of the long head of the biceps tendon. Acromioclavicular Joint: Moderate arthropathy of the acromioclavicular joint. Trace subacromial/subdeltoid bursal fluid. Glenohumeral Joint: Small joint effusion.  No chondral defect. Labrum: Grossly intact, but evaluation is limited by lack of intraarticular fluid/contrast. Bones: No fracture or dislocation. No aggressive osseous lesion. Other: No fluid collection or hematoma. IMPRESSION: 1. Complete full-thickness, full width tear of the supraspinatus tendon with 16 mm of retraction. 2. Mild infraspinatus tendinosis. 3. Moderate subscapularis tendinosis. 4. Moderate tendinosis of the intra-articular portion of the long head of the biceps tendon. Electronically Signed   By: Onnie Bilis M.D.   On: 05/02/2023 18:39     Assessment & Plan: Recurrent UTI.  His UA is clear on TMP.  I have refilled that.   Incontinence.  He has only mild benefit from the Gemtesa .  HE would like to continue that.   Membranous stricture from radiation.  He has a variable stream.   History of prostate cancer.  PSA has been undetectible. Will check in 6 months.   History of stones.  He had only a punctate stone on his last CT. He has had no hematuria or flank pain.  Meds ordered this encounter  Medications   trimethoprim  (TRIMPEX ) 100 MG tablet    Sig: Take 1 tablet (100 mg total) by mouth at bedtime.    Dispense:  90 tablet    Refill:  3     Orders Placed This Encounter  Procedures   Urinalysis, Routine w reflex microscopic   PSA    Standing Status:   Future    Expected Date:   01/06/2024    Expiration Date:   07/05/2024   Ambulatory referral to Urology    Referral Priority:   Urgent    Referral Type:   Consultation    Referral Reason:   Patient Preference    Referred to Provider:   Homero Luster, MD  Requested Specialty:   Urology    Number of Visits Requested:   1      Return in about 6 months (around  01/06/2024) for with PSA in [place]..   CC: Omie Bickers, MD      Homero Luster 07/07/2023 Patient ID: Darrol Emmer, male   DOB: [birth year], 83 y.o.   MRN: [id number]

## 2023-07-07 ENCOUNTER — Encounter: Payer: Self-pay | Admitting: Urology

## 2023-07-28 DIAGNOSIS — Z961 Presence of intraocular lens: Secondary | ICD-10-CM | POA: Diagnosis not present

## 2023-07-28 DIAGNOSIS — H26491 Other secondary cataract, right eye: Secondary | ICD-10-CM | POA: Diagnosis not present

## 2023-07-28 DIAGNOSIS — H47322 Drusen of optic disc, left eye: Secondary | ICD-10-CM | POA: Diagnosis not present

## 2023-07-28 DIAGNOSIS — H0288A Meibomian gland dysfunction right eye, upper and lower eyelids: Secondary | ICD-10-CM | POA: Diagnosis not present

## 2023-07-28 DIAGNOSIS — H0288B Meibomian gland dysfunction left eye, upper and lower eyelids: Secondary | ICD-10-CM | POA: Diagnosis not present

## 2023-07-28 DIAGNOSIS — H40013 Open angle with borderline findings, low risk, bilateral: Secondary | ICD-10-CM | POA: Diagnosis not present

## 2023-07-28 DIAGNOSIS — H04123 Dry eye syndrome of bilateral lacrimal glands: Secondary | ICD-10-CM | POA: Diagnosis not present

## 2023-08-09 ENCOUNTER — Encounter: Payer: Self-pay | Admitting: Cardiology

## 2023-08-09 ENCOUNTER — Ambulatory Visit: Payer: PPO | Attending: Cardiology | Admitting: Cardiology

## 2023-08-09 VITALS — BP 124/70 | HR 60 | Ht 74.0 in | Wt 240.2 lb

## 2023-08-09 DIAGNOSIS — E782 Mixed hyperlipidemia: Secondary | ICD-10-CM

## 2023-08-09 DIAGNOSIS — I495 Sick sinus syndrome: Secondary | ICD-10-CM

## 2023-08-09 DIAGNOSIS — I25119 Atherosclerotic heart disease of native coronary artery with unspecified angina pectoris: Secondary | ICD-10-CM | POA: Diagnosis not present

## 2023-08-09 DIAGNOSIS — I10 Essential (primary) hypertension: Secondary | ICD-10-CM | POA: Diagnosis not present

## 2023-08-09 DIAGNOSIS — I493 Ventricular premature depolarization: Secondary | ICD-10-CM

## 2023-08-09 NOTE — Progress Notes (Signed)
    Cardiology Office Note  Date: 08/09/2023   ID: OREOLUWA AIGNER, DOB 24-Aug-1940, MRN [id number]  History of Present Illness: [name] is an 83 y.o. male last seen in December 2024 by Mr. Marvel Slicker NP, I reviewed his note.  He is here for a follow-up visit.  He does not describe any angina, stable NYHA class II dyspnea.  Does check his pulse at times noting an occasional skipped consistent with PVCs.  He has had no sudden dizziness or syncope.  Orthostatic symptoms have been stable.  States that he still feels like he can do more like he used to, but remains fairly active at age 40.  St. Jude pacemaker in place with follow-up by Dr. Carolynne Citron.  Device check in May indicated normal function.  We went over his medications.  He reports compliance with therapy.  I did review his interval lab work, LDL was 48 in March.  Physical Exam: VS:  BP 124/70   Pulse 60   Ht 6' 2 (1.88 m)   Wt 240 lb 3.2 oz (109 kg)   SpO2 98%   BMI 30.84 kg/m , BMI Body mass index is 30.84 kg/m.  Wt Readings from Last 3 Encounters:  08/09/23 240 lb 3.2 oz (109 kg)  05/24/23 234 lb 9.6 oz (106.4 kg)  05/15/23 234 lb 9.6 oz (106.4 kg)    General: Patient appears comfortable at rest. HEENT: Conjunctiva and lids normal. Neck: Supple, no elevated JVP or carotid bruits. Lungs: Clear to auscultation, nonlabored breathing at rest. Thorax: Stable device pocket site.  Sternal incision well-healed, some hyperpigmentation. Cardiac: Regular rate and rhythm, no S3, 1/6 systolic murmur. Extremities: No pitting edema.  ECG:  An ECG dated 02/13/2023 was personally reviewed today and demonstrated:  Atrial paced rhythm with PVCs, right bundle branch block, left anterior fascicular block.  Labwork:  March 2025: TSH 5.83, hemoglobin 15.5, platelets 180, BUN 27, creatinine 1.74, GFR 38, potassium 4.7, AST 37, ALT 30, cholesterol 114, triglycerides 113, HDL 45, LDL 48  Other Studies Reviewed Today:  No interval cardiac testing  for review today.  Assessment and Plan:  1.  CAD status post CABG in January 2023 including LIMA to LAD and SVG to PDA.  LVEF 55 to 60% by echocardiogram in September 2023.  No angina on medical therapy.  Plan to continue Lipitor 20 mg daily and Zetia 10 mg daily.   2.  Sinus node dysfunction with symptomatic bradycardia status post St. Jude pacemaker in November 2023 with followed by Dr. Carolynne Citron.   3.  Paroxysmal atrial fibrillation with CHA2DS2-VASc score of at least 5.  He is on Eliquis  2.5 mg twice daily for stroke prophylaxis.   4.  Frequent PVCs, 7% rhythm burden by cardiac monitor in September 2023.   5.  Primary hypertension.  Blood pressure well-controlled today.  Continue Norvasc  2.5 mg daily.   6.  CKD stage IIIb.  Creatinine 1.74 with GFR 38 in March.   7.  Mixed hyperlipidemia.  LDL 48 in March.  Continue Lipitor 20 mg daily and Zetia 10 mg daily.   Disposition:  Follow up 6 months.  Signed, Gerard Knight, M.D., F.A.C.C. [facility] at [facility]

## 2023-08-09 NOTE — Patient Instructions (Addendum)

## 2023-08-11 NOTE — Progress Notes (Signed)
 Remote pacemaker transmission.

## 2023-08-14 DIAGNOSIS — E039 Hypothyroidism, unspecified: Secondary | ICD-10-CM | POA: Diagnosis not present

## 2023-08-14 DIAGNOSIS — Z8546 Personal history of malignant neoplasm of prostate: Secondary | ICD-10-CM | POA: Diagnosis not present

## 2023-08-14 DIAGNOSIS — R7303 Prediabetes: Secondary | ICD-10-CM | POA: Diagnosis not present

## 2023-08-14 DIAGNOSIS — I129 Hypertensive chronic kidney disease with stage 1 through stage 4 chronic kidney disease, or unspecified chronic kidney disease: Secondary | ICD-10-CM | POA: Diagnosis not present

## 2023-08-15 ENCOUNTER — Ambulatory Visit (INDEPENDENT_AMBULATORY_CARE_PROVIDER_SITE_OTHER): Admitting: Gastroenterology

## 2023-08-15 ENCOUNTER — Encounter (INDEPENDENT_AMBULATORY_CARE_PROVIDER_SITE_OTHER): Payer: Self-pay | Admitting: Gastroenterology

## 2023-08-15 VITALS — BP 125/61 | HR 60 | Temp 97.8°F | Ht 74.0 in | Wt 240.4 lb

## 2023-08-15 DIAGNOSIS — R1030 Lower abdominal pain, unspecified: Secondary | ICD-10-CM | POA: Diagnosis not present

## 2023-08-15 DIAGNOSIS — K269 Duodenal ulcer, unspecified as acute or chronic, without hemorrhage or perforation: Secondary | ICD-10-CM | POA: Insufficient documentation

## 2023-08-15 DIAGNOSIS — K5900 Constipation, unspecified: Secondary | ICD-10-CM

## 2023-08-15 NOTE — Patient Instructions (Signed)
 Please continue protonix  40mg  twice daily I am providing samples of linzess  for you to have for constipation Increase water  intake, aim for atleast 64 oz per day Increase fruits, veggies and whole grains, kiwi and prunes are especially good for constipation Let me know if you want to try the medication we discussed for belly pain   Follow up 6 months  It was a pleasure to see you today. I want to create trusting relationships with patients and provide genuine, compassionate, and quality care. I truly value your feedback! please be on the lookout for a survey regarding your visit with me today. I appreciate your input about our visit and your time in completing this!    Tyheem Boughner L. Rahmon Heigl, MSN, APRN, AGNP-C Adult-Gerontology Nurse Practitioner [facility] at [facility]

## 2023-08-15 NOTE — Progress Notes (Addendum)
 Referring Provider: Shona Norleen PEDLAR, MD Primary Care Physician:  Shona Norleen PEDLAR, MD Primary GI Physician: Dr. Eartha   Chief Complaint  Patient presents with   Follow-up    Pt arrives for follow up. Pt states things are going ok. No questions/concerns at this time.    HPI:   [name] is a 83 y.o. male with past medical history of prostate cancer s/p radiation seeds, CKD, hypertension, GERD, IBS, Schatzki's ring   Patient presenting today for:  Follow up of constipation lower abdominal pain  epigastric pain/ duodenal ulcers  Last seen march, at that time ran out of linzess  samples, did well when he took them, 2 good BMs per day, taking colace 3 BID and having BM every 3-4 days with harder stools, lower abominal pain. Some epigastric pain as well.   Recommended EGD, colonoscopy, amitiza  8mcg daily, increase water , fruits, veggies, whole grains  EGD and Colonoscopy as outlined below, found to have multiple polyps, non bleeding duodenal ulcers, started on protonix  40mg  BID  Present:  Taking amitiza  8mcg but only a few times per week. He notes that he cannot take it BID as he will have to stay near a restroom as it causes looser stools but he denies bowel incontinence. He has some ongoing lower abdominal discomfort that seems to be present all the time, maybe improved by defecation. Eating does not change his pain. He liked the linzess  he was on previously, felt this worked better for him. He has not been taking colace since starting amitiza . Appetite is good, states he cannot seem to lose any weight. No rectal bleeding or melena.   He is taking protonix  40mg  BID, epigastric pain has improved. No GERD symptoms, nausea or vomiting.    Colonoscopy:05/2023 - Two 4 to 6 mm polyps in the cecum, removed with a                            cold snare. Resected and retrieved.                           - One 15 mm polyp in the ascending colon, removed                            with a cold snare.  Resected via EMR and retrieved.                            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 Clips (MR                            safe) were placed.                           - Seven 3 to 10 mm polyps in the transverse colon                            and in the ascending colon, removed with a cold                            snare. Resected and retrieved.                           -  There was significant looping of the colon.                           - Diverticulosis in the transverse colon and in the                            ascending colon.                           - Non-bleeding internal hemorrhoids.   EGD: 05/2023- Normal esophagus.                           - Normal stomach. Biopsied.                           - Non-bleeding duodenal ulcers with a clean ulcer                            base (Forrest Class III). Biopsied.  A. SMALL BOWEL, BIOPSY:       Duodenal mucosa with foveolar metaplasia and Brunner gland  hyperplasia.      Negative for dysplasia.   B. GASTRIC, BIOPSY:       Gastric antral / oxyntic mucosa without diagnostic alteration.       No H. pylori identified on HE stain.       Negative for intestinal metaplasia or dysplasia.   C. COLON, CECAL, ASCENDING, TRANSVERSE, POLYPECTOMY:       Fragments of tubular adenoma.       Negative for high-grade dysplasia.   CT renal stone study: 04/2022 No findings to explain the patient's clinical history. 2. No evidence of metastatic disease. 3. Left renal stone.  4.  Aortic atherosclerosis (ICD10-I70.0). 5. Enlarged pulmonic trunk, indicative of pulmonary arterial hypertension.  Repeat TCS 1 year    [facility] Weights   08/15/23 0946  Weight: 240 lb 6.4 oz (109 kg)     Past Medical History:  Diagnosis Date   Arthritis    CAD (coronary artery disease)    Two-vessel obstructive disease November 2022; CABG with LIMA to LAD and SVG to PDA January 2023   Chronic back pain    CKD (chronic kidney disease) stage 3, GFR  30-59 ml/min (HCC)    Diastolic dysfunction    a. 10/2021 Echo: EF 55-60%, no rwma, GrI DD, nl RV fxn. Mildly dil LA. Triv MR/AI. AoV sclerosis w/o stenosis. Asc Ao 38mm.   Essential hypertension    GERD (gastroesophageal reflux disease)    Helicobacter pylori gastritis    Treated   History of kidney stones    Hx of adenomatous colonic polyps    IBS (irritable bowel syndrome)    PAF (paroxysmal atrial fibrillation) (HCC)    Prostate CA (HCC)    Seed implants   Schatzki's ring    Sinus node dysfunction (HCC)    a. 12/2021 s/p SJM PPM.   Stroke (HCC)    left hand numbness at times    Past Surgical History:  Procedure Laterality Date   APPENDECTOMY  age 73   BACK SURGERY  03/24/2006   [facility]   BACK SURGERY  09/24/2004   [facility], [facility]   BACK SURGERY  09/18/2002   [facility]   BILIARY STENT PLACEMENT  01/07/2012  Procedure: BILIARY STENT PLACEMENT;  Surgeon: Claudis RAYMOND Rivet, MD;  Location: [facility];  Service: Endoscopy;  Laterality: N/A;   BILIARY STENT PLACEMENT  02/21/2012   Procedure: BILIARY STENT PLACEMENT;  Surgeon: Claudis RAYMOND Rivet, MD;  Location: [facility];  Service: Endoscopy;  Laterality: N/A;  Biliary Stent Replacement   CATARACT EXTRACTION W/PHACO  11/22/2010   Procedure: CATARACT EXTRACTION PHACO AND INTRAOCULAR LENS PLACEMENT (IOC);  Surgeon: Dow JULIANNA Burke;  Location: [facility];  Service: Ophthalmology;  Laterality: Right;  CDE: 6.81   CHOLECYSTECTOMY  03/04/2005   [facility], [facility]. Gangrenous cholecystitis complicated by abscess requiring percutaneous drainage. He also had common duct stones requiring ERCP and sphincterotomy.   COLONOSCOPY  09/22/2003   Scattered sigmoid diverticulosis, splenic flexure polyp. Hyperplastic   COLONOSCOPY  02/22/1995   3 cm tubular adenoma and a sending colon   COLONOSCOPY  02/01/2011   Rourk-friable anal canal, hyperplastic rectal polyp   COLONOSCOPY N/A 08/15/2013   Procedure: COLONOSCOPY;  Surgeon: Claudis RAYMOND Rivet, MD;  [facility];   Service: Endoscopy;  Laterality: N/A;  100   COLONOSCOPY N/A 09/20/2018   Procedure: COLONOSCOPY;  Surgeon: Rivet Claudis RAYMOND, MD;  [facility]: [facility];  Service: Endoscopy;  Laterality: N/A;  12:00pm   COLONOSCOPY N/A 05/26/2023   Procedure: COLONOSCOPY;  Surgeon: Eartha Angelia Sieving, MD;  [facility]: [facility];  Service: Gastroenterology;  Laterality: N/A;  1:45PM;ASA 3   CORONARY ARTERY BYPASS GRAFT N/A 03/09/2021   Procedure: OFF PUMP CORONARY ARTERY BYPASS GRAFTING (CABG) X2, USING LEFT INTERNAL MAMMARY ARTERY AND RIGHT LEG GREATER SAPHENOUS VEIN HARVESTED ENDOSCOPICALLY;  Surgeon: Shyrl Linnie KIDD, MD;  Location: [facility];  Service: Open Heart Surgery;  Laterality: N/A;  X 2   ERCP  03/02/2005   [facility], [facility]. Normal-appearing biliary tree (gallbladder not image), status post sphincterotomy with recovery of small pieces of stone material, status post balloon occlusion cholangiogram.   ERCP  01/07/2012   Procedure: ENDOSCOPIC RETROGRADE CHOLANGIOPANCREATOGRAPHY (ERCP);  Surgeon: Claudis RAYMOND Rivet, MD;  Location: [facility];  Service: Endoscopy;  Laterality: N/A;  possible biliary stenting   ERCP  02/21/2012   Procedure: ENDOSCOPIC RETROGRADE CHOLANGIOPANCREATOGRAPHY (ERCP);  Surgeon: Claudis RAYMOND Rivet, MD;  Location: [facility];  Service: Endoscopy;  Laterality: N/A;  common bile duct stone and clip removed   ERCP N/A 05/18/2012   Procedure: ENDOSCOPIC RETROGRADE CHOLANGIOPANCREATOGRAPHY (ERCP);  Surgeon: Claudis RAYMOND Rivet, MD;  Location: [facility];  Service: Endoscopy;  Laterality: N/A;  stone and debri removal   ESOPHAGOGASTRODUODENOSCOPY  09/22/2003   Erosive esophagitis and Schatzki ring, small hiatal hernia   ESOPHAGOGASTRODUODENOSCOPY  01/22/2008   Mild reflux esophagitis, small hiatal hernia   ESOPHAGOGASTRODUODENOSCOPY  02/01/2011   Rourk-erosive reflux esophagitis, small hiatal hernia   ESOPHAGOGASTRODUODENOSCOPY  10/26/2011   Dr. Lisa gastric submucosal petechia (bx-benign  ulceration), juxta ampullary duodenal diverticulum and some mucosal edema involving 1st/2nd portion of duodenum with superficial erosions (bx-superficial ulceration/benign)   ESOPHAGOGASTRODUODENOSCOPY  02/21/2012   Procedure: ESOPHAGOGASTRODUODENOSCOPY (EGD);  Surgeon: Claudis RAYMOND Rivet, MD;  [facility]: [facility];  Service: Endoscopy;  Laterality: N/A;   ESOPHAGOGASTRODUODENOSCOPY N/A 05/26/2023   Procedure: EGD (ESOPHAGOGASTRODUODENOSCOPY);  Surgeon: Eartha Angelia, Sieving, MD;  [facility];  Service: Gastroenterology;  Laterality: N/A;  1:45PM;ASA 3   EYE SURGERY     HEMOSTASIS CLIP PLACEMENT  05/26/2023   Procedure: CONTROL OF HEMORRHAGE, GI TRACT, ENDOSCOPIC, BY CLIPPING OR OVERSEWING;  Surgeon: Eartha Angelia, Sieving, MD;  [facility];  Service: Gastroenterology;;  Incomplete colonoscopy  01/22/2008   Left-sided diverticula, mid descending colon polyp, due to recurrent looping and redundancy exam was incomplete. It was felt that the mid colon was reached. Followup barium enema showed colon interposition between the liver and the diaphragm, redundant sigmoid colon but no colon mass or polyp identified.. Pathology revealed tubular adenoma.   INSERT / REPLACE / REMOVE PACEMAKER     LEFT HEART CATH AND CORONARY ANGIOGRAPHY N/A 01/18/2021   Procedure: LEFT HEART CATH AND CORONARY ANGIOGRAPHY;  Surgeon: Court Dorn PARAS, MD;  [facility]: [facility];  Service: Cardiovascular;  Laterality: N/A;   PACEMAKER IMPLANT N/A 12/22/2021   Procedure: PACEMAKER IMPLANT;  Surgeon: Waddell Danelle ORN, MD;  [facility];  Service: Cardiovascular;  Laterality: N/A;   POLYPECTOMY  09/20/2018   Procedure: POLYPECTOMY;  Surgeon: Golda Claudis PENNER, MD;  [facility];  Service: Endoscopy;;  Hepatic flexure polyp cold snare, splenic flexure polyp   POLYPECTOMY  05/26/2023   Procedure: POLYPECTOMY, INTESTINE;  Surgeon: Eartha Angelia Sieving, MD;  [facility];  Service: Gastroenterology;;   ANNETT  01/07/2012   Procedure: ANNETT;  Surgeon: Claudis PENNER Golda, MD;  Location: [facility];  Service: Endoscopy;  Laterality: N/A;  Extended   SPYGLASS CHOLANGIOSCOPY  02/21/2012   Procedure: SPYGLASS CHOLANGIOSCOPY;  Surgeon: Claudis PENNER Golda, MD;  [facility]: [facility];  Service: Endoscopy;  Laterality: N/A;   SPYGLASS CHOLANGIOSCOPY N/A 05/18/2012   Procedure: SPYGLASS CHOLANGIOSCOPY;  Surgeon: Claudis PENNER Golda, MD;  [facility]: [facility];  Service: Endoscopy;  Laterality: N/A;   SUBMUCOSAL INJECTION  05/26/2023   Procedure: INJECTION, SUBMUCOSAL;  Surgeon: Eartha Angelia, Sieving, MD;  [facility];  Service: Gastroenterology;;   TEE WITHOUT CARDIOVERSION N/A 03/09/2021   Procedure: TRANSESOPHAGEAL ECHOCARDIOGRAM (TEE);  Surgeon: Shyrl Linnie KIDD, MD;  Location: [facility] OR;  Service: Open Heart Surgery;  Laterality: N/A;   TOTAL KNEE ARTHROPLASTY  03/21/2012   Procedure: TOTAL KNEE ARTHROPLASTY;  Surgeon: Dempsey PARAS Sensor, MD;  Location: [facility];  Service: Orthopedics;  Laterality: Right;  right knee arthroplasty   TOTAL KNEE ARTHROPLASTY Left 04/08/2019   Procedure: LEFT TOTAL KNEE ARTHROPLASTY;  Surgeon: Sensor Dempsey, MD;  Location: [facility];  Service: Orthopedics;  Laterality: Left;    Current Outpatient Medications  Medication Sig Dispense Refill   acetaminophen  (TYLENOL ) 500 MG tablet Take 500 mg by mouth every 6 (six) hours as needed (for pain.).     amLODipine  (NORVASC ) 2.5 MG tablet Take 1 tablet (2.5 mg total) by mouth daily. 90 tablet 3   apixaban  (ELIQUIS ) 2.5 MG TABS tablet Take 1 tablet (2.5 mg total) by mouth 2 (two) times daily. 200 tablet 1   atorvastatin  (LIPITOR) 20 MG tablet TAKE ONE (1) TABLET BY MOUTH EVERY DAY 90 tablet 3   Docusate Sodium  (COLACE PO) Take by mouth. Takes 3 BID.     ezetimibe (ZETIA) 10 MG tablet Take 10 mg by mouth at bedtime.     LINZESS  145 MCG CAPS capsule Take 145 mcg by mouth daily.     lubiprostone   (AMITIZA ) 8 MCG capsule Take 1 capsule (8 mcg total) by mouth 2 (two) times daily with a meal. 60 capsule 1   pantoprazole  (PROTONIX ) 40 MG tablet Take 1 tablet (40 mg total) by mouth 2 (two) times daily. 180 tablet 1   trimethoprim  (TRIMPEX ) 100 MG tablet Take 1 tablet (100 mg total) by mouth at bedtime. 90 tablet 3   Vibegron  (GEMTESA ) 75 MG  TABS Take 1 tablet (75 mg total) by mouth daily. 42 tablet 0   No current facility-administered medications for this visit.    Allergies as of 08/15/2023   (No Known Allergies)    Social History   Socioeconomic History   Marital status: Married    Spouse name: Not on file   Number of children: 2   Years of education: Not on file   Highest education level: Not on file  Occupational History   Occupation: Retired Careers information officer: RETIRED  Tobacco Use   Smoking status: Never   Smokeless tobacco: Never  Vaping Use   Vaping status: Never Used  Substance and Sexual Activity   Alcohol use: No    Alcohol/week: 0.0 standard drinks of alcohol   Drug use: No   Sexual activity: Not on file  Other Topics Concern   Not on file  Social History Narrative   Not on file   Social Drivers of Health   Financial Resource Strain: Not on file  Food Insecurity: Not on file  Transportation Needs: Not on file  Physical Activity: Not on file  Stress: Not on file  Social Connections: Unknown (10/12/2022)   Received from [employer]   Social Network    Social Network: Not on file    Review of systems General: negative for malaise, night sweats, fever, chills, weight loss Neck: Negative for lumps, goiter, pain and significant neck swelling Resp: Negative for cough, wheezing, dyspnea at rest CV: Negative for chest pain, leg swelling, palpitations, orthopnea GI: denies melena, hematochezia, nausea, vomiting, diarrhea, dysphagia, odyonophagia, early satiety or unintentional weight loss. +constipation +lower abdominal pain  MSK: Negative for joint  pain or swelling, back pain, and muscle pain. Derm: Negative for itching or rash Psych: Denies depression, anxiety, memory loss, confusion. No homicidal or suicidal ideation.  Heme: Negative for prolonged bleeding, bruising easily, and swollen nodes. Endocrine: Negative for cold or heat intolerance, polyuria, polydipsia and goiter. Neuro: negative for tremor, gait imbalance, syncope and seizures. The remainder of the review of systems is noncontributory.  Physical Exam: BP 125/61   Pulse 60   Temp 97.8 F (36.6 C)   Ht 6' 2 (1.88 m)   Wt 240 lb 6.4 oz (109 kg)   BMI 30.87 kg/m  General:   Alert and oriented. No distress noted. Pleasant and cooperative.  Head:  Normocephalic and atraumatic. Eyes:  Conjuctiva clear without scleral icterus. Mouth:  Oral mucosa pink and moist. Good dentition. No lesions. Heart: Normal rate and rhythm, s1 and s2 heart sounds present.  Lungs: Clear lung sounds in all lobes. Respirations equal and unlabored. Abdomen:  +BS, soft, non-tender and non-distended. No rebound or guarding. No HSM or masses noted. Derm: No palmar erythema or jaundice Msk:  Symmetrical without gross deformities. Normal posture. Extremities:  Without edema. Neurologic:  Alert and  oriented x4 Psych:  Alert and cooperative. Normal mood and affect.  Invalid input(s): 6 MONTHS   ASSESSMENT: [name] is a 83 y.o. male presenting today for follow up of constipation, lower abdominal pain and epigastric pain/duodenal ulcers  Constipation/lower abdominal pain: taking amitiza  a few times per week, reports he cannot take it daily as he has to stay near a bathroom. No blood in stools or black stools. Recent colonoscopy as above with loopy colon, likely contributing to his constipation. He prefers linzess  though this is not covered on his insurance, is not eligible for patient assistance. Will provide samples of linzess  145mcg  for him today. We discussed he will likely need to remain on a  good bowel regimen to keep his bowels moving. Reassuringly he has no blood in stools, black stools and has actually gained some weight. Pain is not associated with eating. Suspect some underlying IBS. We discussed low dose bentyl but he is not interested in this right now.   Epigastric pain/duodenal ulcers: recent EGD with duodenal ulcers, started on PPI BID. Reports resolution of epigastric pain. No GERD symptoms. Appetite is good, no early satiety, nausea, vomiting or weight loss. There was mention of possible CT Angio A/P in EGD procedure note though this has not yet been ordered, will discuss with Dr. Eartha for further determination on need for this.   PLAN:  -continue PPI BID -repeat TCS 1 year -discuss need for CT Angio with Dr. Eartha  -avoid NSAIDs -linzess  145 mcg daily, samples provided  -Increase water  intake, aim for atleast 64 oz per day -Increase fruits, veggies and whole grains, kiwi and prunes are especially good for constipation -pt to consider low dose bentyl for abdominal discomfort   All questions were answered, patient verbalized understanding and is in agreement with plan as outlined above.   Follow Up: 6 months    Alec Mcphee L. Mariette, MSN, APRN, AGNP-C Adult-Gerontology Nurse Practitioner [facility]  I have reviewed the note and agree with the APP's assessment as described in this progress note  As patient has presented improvement of abdominal pain with BID PPI, will continue this for now and will hold off on further imaging, unless pain recurs.  Toribio Eartha, MD Gastroenterology and Hepatology [facility] Gastroenterology

## 2023-08-18 DIAGNOSIS — E782 Mixed hyperlipidemia: Secondary | ICD-10-CM | POA: Diagnosis not present

## 2023-08-18 DIAGNOSIS — N1832 Chronic kidney disease, stage 3b: Secondary | ICD-10-CM | POA: Diagnosis not present

## 2023-08-18 DIAGNOSIS — R7303 Prediabetes: Secondary | ICD-10-CM | POA: Diagnosis not present

## 2023-08-18 DIAGNOSIS — Z8546 Personal history of malignant neoplasm of prostate: Secondary | ICD-10-CM | POA: Diagnosis not present

## 2023-08-18 DIAGNOSIS — R0609 Other forms of dyspnea: Secondary | ICD-10-CM | POA: Diagnosis not present

## 2023-08-18 DIAGNOSIS — I2510 Atherosclerotic heart disease of native coronary artery without angina pectoris: Secondary | ICD-10-CM | POA: Diagnosis not present

## 2023-08-18 DIAGNOSIS — M25511 Pain in right shoulder: Secondary | ICD-10-CM | POA: Diagnosis not present

## 2023-08-18 DIAGNOSIS — D6869 Other thrombophilia: Secondary | ICD-10-CM | POA: Diagnosis not present

## 2023-08-18 DIAGNOSIS — R2232 Localized swelling, mass and lump, left upper limb: Secondary | ICD-10-CM | POA: Diagnosis not present

## 2023-08-18 DIAGNOSIS — I700 Atherosclerosis of aorta: Secondary | ICD-10-CM | POA: Diagnosis not present

## 2023-08-18 DIAGNOSIS — Z951 Presence of aortocoronary bypass graft: Secondary | ICD-10-CM | POA: Diagnosis not present

## 2023-08-18 DIAGNOSIS — I129 Hypertensive chronic kidney disease with stage 1 through stage 4 chronic kidney disease, or unspecified chronic kidney disease: Secondary | ICD-10-CM | POA: Diagnosis not present

## 2023-08-29 DIAGNOSIS — H0288B Meibomian gland dysfunction left eye, upper and lower eyelids: Secondary | ICD-10-CM | POA: Diagnosis not present

## 2023-08-29 DIAGNOSIS — H04123 Dry eye syndrome of bilateral lacrimal glands: Secondary | ICD-10-CM | POA: Diagnosis not present

## 2023-08-29 DIAGNOSIS — Z961 Presence of intraocular lens: Secondary | ICD-10-CM | POA: Diagnosis not present

## 2023-08-29 DIAGNOSIS — H0288A Meibomian gland dysfunction right eye, upper and lower eyelids: Secondary | ICD-10-CM | POA: Diagnosis not present

## 2023-08-29 DIAGNOSIS — H04203 Unspecified epiphora, bilateral lacrimal glands: Secondary | ICD-10-CM | POA: Diagnosis not present

## 2023-08-29 DIAGNOSIS — H40013 Open angle with borderline findings, low risk, bilateral: Secondary | ICD-10-CM | POA: Diagnosis not present

## 2023-09-25 ENCOUNTER — Ambulatory Visit: Payer: PPO

## 2023-09-25 DIAGNOSIS — I495 Sick sinus syndrome: Secondary | ICD-10-CM

## 2023-09-25 LAB — CUP PACEART REMOTE DEVICE CHECK
Battery Remaining Longevity: 105 mo
Battery Remaining Percentage: 85 %
Battery Voltage: 3.01 V
Brady Statistic AP VP Percent: 2 %
Brady Statistic AP VS Percent: 73 %
Brady Statistic AS VP Percent: 1.3 %
Brady Statistic AS VS Percent: 20 %
Brady Statistic RA Percent Paced: 68 %
Brady Statistic RV Percent Paced: 3.3 %
Date Time Interrogation Session: 20250804025322
Implantable Lead Connection Status: 753985
Implantable Lead Connection Status: 753985
Implantable Lead Implant Date: 20231101
Implantable Lead Implant Date: 20231101
Implantable Lead Location: 753859
Implantable Lead Location: 753860
Implantable Pulse Generator Implant Date: 20231101
Lead Channel Impedance Value: 400 Ohm
Lead Channel Impedance Value: 490 Ohm
Lead Channel Pacing Threshold Amplitude: 0.375 V
Lead Channel Pacing Threshold Amplitude: 0.75 V
Lead Channel Pacing Threshold Pulse Width: 0.5 ms
Lead Channel Pacing Threshold Pulse Width: 0.5 ms
Lead Channel Sensing Intrinsic Amplitude: 12 mV
Lead Channel Sensing Intrinsic Amplitude: 5 mV
Lead Channel Setting Pacing Amplitude: 1 V
Lead Channel Setting Pacing Amplitude: 1.375
Lead Channel Setting Pacing Pulse Width: 0.5 ms
Lead Channel Setting Sensing Sensitivity: 2 mV
Pulse Gen Model: 2272
Pulse Gen Serial Number: 8115159

## 2023-09-26 ENCOUNTER — Ambulatory Visit: Payer: Self-pay | Admitting: Internal Medicine

## 2023-09-27 DIAGNOSIS — H0288A Meibomian gland dysfunction right eye, upper and lower eyelids: Secondary | ICD-10-CM | POA: Diagnosis not present

## 2023-09-27 DIAGNOSIS — H40013 Open angle with borderline findings, low risk, bilateral: Secondary | ICD-10-CM | POA: Diagnosis not present

## 2023-09-27 DIAGNOSIS — H0288B Meibomian gland dysfunction left eye, upper and lower eyelids: Secondary | ICD-10-CM | POA: Diagnosis not present

## 2023-09-27 DIAGNOSIS — H47091 Other disorders of optic nerve, not elsewhere classified, right eye: Secondary | ICD-10-CM | POA: Diagnosis not present

## 2023-09-27 DIAGNOSIS — H04123 Dry eye syndrome of bilateral lacrimal glands: Secondary | ICD-10-CM | POA: Diagnosis not present

## 2023-09-27 DIAGNOSIS — Z961 Presence of intraocular lens: Secondary | ICD-10-CM | POA: Diagnosis not present

## 2023-11-08 DIAGNOSIS — N401 Enlarged prostate with lower urinary tract symptoms: Secondary | ICD-10-CM | POA: Diagnosis not present

## 2023-11-08 DIAGNOSIS — R310 Gross hematuria: Secondary | ICD-10-CM | POA: Diagnosis not present

## 2023-11-08 DIAGNOSIS — R350 Frequency of micturition: Secondary | ICD-10-CM | POA: Diagnosis not present

## 2023-11-08 DIAGNOSIS — N35012 Post-traumatic membranous urethral stricture: Secondary | ICD-10-CM | POA: Diagnosis not present

## 2023-11-08 DIAGNOSIS — R3915 Urgency of urination: Secondary | ICD-10-CM | POA: Diagnosis not present

## 2023-11-13 ENCOUNTER — Other Ambulatory Visit: Payer: Self-pay | Admitting: Cardiology

## 2023-11-14 DIAGNOSIS — Z8546 Personal history of malignant neoplasm of prostate: Secondary | ICD-10-CM | POA: Diagnosis not present

## 2023-11-20 ENCOUNTER — Other Ambulatory Visit: Payer: Self-pay | Admitting: Cardiology

## 2023-11-20 ENCOUNTER — Ambulatory Visit (HOSPITAL_COMMUNITY)
Admission: RE | Admit: 2023-11-20 | Discharge: 2023-11-20 | Disposition: A | Discharge status: Home / Self Care | Source: Ambulatory Visit | Attending: Nurse Practitioner | Admitting: Nurse Practitioner

## 2023-11-20 ENCOUNTER — Other Ambulatory Visit (HOSPITAL_COMMUNITY): Payer: Self-pay | Admitting: Nurse Practitioner

## 2023-11-20 DIAGNOSIS — K4020 Bilateral inguinal hernia, without obstruction or gangrene, not specified as recurrent: Secondary | ICD-10-CM | POA: Diagnosis not present

## 2023-11-20 DIAGNOSIS — R1011 Right upper quadrant pain: Secondary | ICD-10-CM

## 2023-11-20 DIAGNOSIS — K5730 Diverticulosis of large intestine without perforation or abscess without bleeding: Secondary | ICD-10-CM | POA: Diagnosis not present

## 2023-11-20 DIAGNOSIS — R109 Unspecified abdominal pain: Secondary | ICD-10-CM | POA: Diagnosis not present

## 2023-11-20 DIAGNOSIS — I4819 Other persistent atrial fibrillation: Secondary | ICD-10-CM

## 2023-11-20 MED ORDER — IOHEXOL 300 MG/ML  SOLN
100.0000 mL | Freq: Once | INTRAMUSCULAR | Status: AC | PRN
  Administered 2023-11-20: 75 mL via INTRAVENOUS

## 2023-11-20 NOTE — Telephone Encounter (Signed)
 Prescription refill request for Eliquis  received. Indication: a fib Last office visit: 08/09/23 Scr: 1.62 [facility] 11/15/23 Age: 83 Weight: 109kg

## 2023-11-20 NOTE — Progress Notes (Signed)
 Remote PPM Transmission

## 2023-11-28 LAB — POCT I-STAT CREATININE: Creatinine, Ser: 1.9 mg/dL — ABNORMAL HIGH (ref 0.61–1.24)

## 2023-11-30 DIAGNOSIS — N200 Calculus of kidney: Secondary | ICD-10-CM | POA: Diagnosis not present

## 2023-11-30 DIAGNOSIS — K5730 Diverticulosis of large intestine without perforation or abscess without bleeding: Secondary | ICD-10-CM | POA: Diagnosis not present

## 2023-11-30 DIAGNOSIS — R310 Gross hematuria: Secondary | ICD-10-CM | POA: Diagnosis not present

## 2023-12-01 ENCOUNTER — Other Ambulatory Visit: Payer: Self-pay | Admitting: Internal Medicine

## 2023-12-06 ENCOUNTER — Encounter (INDEPENDENT_AMBULATORY_CARE_PROVIDER_SITE_OTHER): Payer: Self-pay | Admitting: Gastroenterology

## 2023-12-13 DIAGNOSIS — Z8546 Personal history of malignant neoplasm of prostate: Secondary | ICD-10-CM | POA: Diagnosis not present

## 2023-12-13 DIAGNOSIS — Z23 Encounter for immunization: Secondary | ICD-10-CM | POA: Diagnosis not present

## 2023-12-13 DIAGNOSIS — R7303 Prediabetes: Secondary | ICD-10-CM | POA: Diagnosis not present

## 2023-12-13 DIAGNOSIS — I129 Hypertensive chronic kidney disease with stage 1 through stage 4 chronic kidney disease, or unspecified chronic kidney disease: Secondary | ICD-10-CM | POA: Diagnosis not present

## 2023-12-14 ENCOUNTER — Ambulatory Visit: Admitting: Orthopaedic Surgery

## 2023-12-14 ENCOUNTER — Other Ambulatory Visit (INDEPENDENT_AMBULATORY_CARE_PROVIDER_SITE_OTHER): Payer: Self-pay

## 2023-12-14 DIAGNOSIS — G5602 Carpal tunnel syndrome, left upper limb: Secondary | ICD-10-CM

## 2023-12-14 DIAGNOSIS — M25512 Pain in left shoulder: Secondary | ICD-10-CM

## 2023-12-14 DIAGNOSIS — M542 Cervicalgia: Secondary | ICD-10-CM

## 2023-12-14 DIAGNOSIS — G8929 Other chronic pain: Secondary | ICD-10-CM

## 2023-12-14 NOTE — Progress Notes (Signed)
 Office Visit Note   Patient: [name]           Date of Birth: Jun 27, 1940           MRN: [id number] Visit Date: 12/14/2023              Requested by: Shona Norleen PEDLAR, MD [address] [place],  [place] [address] PCP: Shona Norleen PEDLAR, MD   Assessment & Plan: Visit Diagnoses:  1. Chronic left shoulder pain   2. Neck pain   3. Left carpal tunnel syndrome     Plan: History of Present Illness [name] is an 83 year old male who presents with neck and left shoulder pain.  An MRI in March revealed a torn rotator cuff with a portion retracted approximately two centimeters. He experiences significant discomfort at night, with pain and numbness radiating down his arm into his hand, causing a sensation of the hand being 'asleep'. During the day, he experiences minimal discomfort.  He has undergone open heart surgery and pacemaker placement. Post-surgery, he experienced arm and hand swelling due to blood clots, which resolved with an increased dosage of Eliquis . He currently experiences numbness in his hand, which he associates with his previous surgery.  Physical Exam MUSCULOSKELETAL: Carpal tunnel compression test negative.  No thenar wasting.  Negative Spurling sign.  Results RADIOLOGY Shoulder MRI: Rotator cuff tear with 2 cm retraction (04/2023)  Assessment and Plan Chronic left shoulder rotator cuff tear with pain MRI showed a 2 cm retracted tear. Pain significant at night. Surgical intervention not preferred due to age and previous surgeries. Higher failure rate in patients over 70. - Discussed non-surgical management including living with pain and rehabilitation. - Consider surgical intervention if symptoms become unmanageable.  Suspected left carpal tunnel syndrome Numbness in hand, especially at night. Negative Spurling sign and carpal tunnel compression test. - Provide carpal tunnel brace for left hand to be worn at night for two weeks. - If no improvement after two  weeks, order nerve conduction study.  Follow-Up Instructions: No follow-ups on file.   Orders:  Orders Placed This Encounter  Procedures   XR Shoulder Left   XR Cervical Spine 2 or 3 views   No orders of the defined types were placed in this encounter.     Procedures: No procedures performed   Clinical Data: No additional findings.   Subjective: Chief Complaint  Patient presents with   Left Shoulder - Pain    HPI  Review of Systems  Constitutional: Negative.   HENT: Negative.    Eyes: Negative.   Respiratory: Negative.    Cardiovascular: Negative.   Gastrointestinal: Negative.   Endocrine: Negative.   Genitourinary: Negative.   Skin: Negative.   Allergic/Immunologic: Negative.   Neurological: Negative.   Hematological: Negative.   Psychiatric/Behavioral: Negative.    All other systems reviewed and are negative.    Objective: Vital Signs: There were no vitals taken for this visit.  Physical Exam Vitals and nursing note reviewed.  Constitutional:      Appearance: He is well-developed.  HENT:     Head: Normocephalic and atraumatic.  Eyes:     Pupils: Pupils are equal, round, and reactive to light.  Pulmonary:     Effort: Pulmonary effort is normal.  Abdominal:     Palpations: Abdomen is soft.  Musculoskeletal:        General: Normal range of motion.     Cervical back: Neck supple.  Skin:  General: Skin is warm.  Neurological:     Mental Status: He is alert and oriented to person, place, and time.  Psychiatric:        Behavior: Behavior normal.        Thought Content: Thought content normal.        Judgment: Judgment normal.     Ortho Exam  Specialty Comments:  No specialty comments available.  Imaging: XR Shoulder Left Result Date: 12/14/2023 X-rays of the left shoulder show degenerative changes of the [facility] joint.  XR Cervical Spine 2 or 3 views Result Date: 12/14/2023 X-rays of the cervical spine show multilevel degenerative  changes.  No acute abnormalities.    PMFS History: Patient Active Problem List   Diagnosis Date Noted   Duodenal ulcer 08/15/2023   Visit for wound check 03/22/2021   Acute postoperative anemia due to expected blood loss 03/10/2021   Bilateral atelectasis 03/10/2021   S/P CABG x 2 03/09/2021   CAD (coronary artery disease) 03/09/2021   GERD (gastroesophageal reflux disease) 03/09/2021   HTN (hypertension) 03/09/2021   Bloating 12/25/2019   Abdominal pain 12/25/2019   Colonic adenoma 05/01/2018   Degenerative arthritis of left knee 11/01/2017   Abnormal cardiac function test 09/22/2015   Chronic renal disease, stage III (HCC) 09/22/2015   Family history of coronary artery disease in mother 09/22/2015   Bifascicular block 09/22/2015   Hypertrophy of breast 11/26/2014   AKI (acute kidney injury) 01/24/2014   Impaired mobility and ADLs 12/05/2013   Bacteremia 11/15/2013   Spinal abscess (HCC) 11/15/2013   Thoracic kyphosis 10/28/2013   Hemiplegia of dominant side as late effect following cerebrovascular disease (HCC) 10/28/2013   Postoperative shock 10/28/2013   Acute diastolic congestive heart failure (HCC) 10/28/2013   Sciatica 10/18/2013   History of prostate cancer 10/08/2013   Left lumbar radiculopathy 10/08/2013   History of colonic polyps 07/23/2013   Osteoarthritis of right knee 03/22/2012   Common bile duct (CBD) stricture (HCC) 01/16/2012   Epigastric pain 01/19/2011   Hx of adenomatous colonic polyps 01/19/2011   Irritable bowel syndrome with constipation 01/19/2011   Constipation 01/19/2011   Rectal bleeding 01/19/2011   Thrombocytopenia 01/19/2011   Past Medical History:  Diagnosis Date   Arthritis    CAD (coronary artery disease)    Two-vessel obstructive disease November 2022; CABG with LIMA to LAD and SVG to PDA January 2023   Chronic back pain    CKD (chronic kidney disease) stage 3, GFR 30-59 ml/min (HCC)    Diastolic dysfunction    a. 10/2021 Echo:  EF 55-60%, no rwma, GrI DD, nl RV fxn. Mildly dil LA. Triv MR/AI. AoV sclerosis w/o stenosis. Asc Ao 38mm.   Essential hypertension    GERD (gastroesophageal reflux disease)    Helicobacter pylori gastritis    Treated   History of kidney stones    Hx of adenomatous colonic polyps    IBS (irritable bowel syndrome)    PAF (paroxysmal atrial fibrillation) (HCC)    Prostate CA (HCC)    Seed implants   Schatzki's ring    Sinus node dysfunction (HCC)    a. 12/2021 s/p SJM PPM.   Stroke (HCC)    left hand numbness at times    Family History  Problem Relation Age of Onset   Diabetes Mother    Coronary artery disease Mother    Coronary artery disease Father    Diabetes Father    Stomach cancer Father    Deep vein thrombosis  Daughter    Hypotension Neg Hx    Anesthesia problems Neg Hx    Malignant hyperthermia Neg Hx    Pseudochol deficiency Neg Hx    Colon cancer Neg Hx     Past Surgical History:  Procedure Laterality Date   APPENDECTOMY  age 35   BACK SURGERY  03/24/2006   [facility]   BACK SURGERY  09/24/2004   lumbar, [facility]   BACK SURGERY  09/18/2002   [facility]   BILIARY STENT PLACEMENT  01/07/2012   Procedure: BILIARY STENT PLACEMENT;  Surgeon: Claudis RAYMOND Rivet, MD;  Location: [facility];  Service: Endoscopy;  Laterality: N/A;   BILIARY STENT PLACEMENT  02/21/2012   Procedure: BILIARY STENT PLACEMENT;  Surgeon: Claudis RAYMOND Rivet, MD;  Location: [facility];  Service: Endoscopy;  Laterality: N/A;  Biliary Stent Replacement   CATARACT EXTRACTION W/PHACO  11/22/2010   Procedure: CATARACT EXTRACTION PHACO AND INTRAOCULAR LENS PLACEMENT (IOC);  Surgeon: Dow JULIANNA Burke;  Location: [facility];  Service: Ophthalmology;  Laterality: Right;  CDE: 6.81   CHOLECYSTECTOMY  03/04/2005   [facility], [facility]. Gangrenous cholecystitis complicated by abscess requiring percutaneous drainage. He also had common duct stones requiring ERCP and sphincterotomy.   COLONOSCOPY  09/22/2003   Scattered sigmoid diverticulosis,  splenic flexure polyp. Hyperplastic   COLONOSCOPY  02/22/1995   3 cm tubular adenoma and a sending colon   COLONOSCOPY  02/01/2011   Rourk-friable anal canal, hyperplastic rectal polyp   COLONOSCOPY N/A 08/15/2013   Procedure: COLONOSCOPY;  Surgeon: Claudis RAYMOND Rivet, MD;  [facility]: [facility];  Service: Endoscopy;  Laterality: N/A;  100   COLONOSCOPY N/A 09/20/2018   Procedure: COLONOSCOPY;  Surgeon: Rivet Claudis RAYMOND, MD;  [facility]: [facility];  Service: Endoscopy;  Laterality: N/A;  12:00pm   COLONOSCOPY N/A 05/26/2023   Procedure: COLONOSCOPY;  Surgeon: Eartha Angelia Sieving, MD;  [facility];  Service: Gastroenterology;  Laterality: N/A;  1:45PM;ASA 3   CORONARY ARTERY BYPASS GRAFT N/A 03/09/2021   Procedure: OFF PUMP CORONARY ARTERY BYPASS GRAFTING (CABG) X2, USING LEFT INTERNAL MAMMARY ARTERY AND RIGHT LEG GREATER SAPHENOUS VEIN HARVESTED ENDOSCOPICALLY;  Surgeon: Shyrl Linnie KIDD, MD;  Location: [facility];  Service: Open Heart Surgery;  Laterality: N/A;  X 2   ERCP  03/02/2005   [facility], [facility]. Normal-appearing biliary tree (gallbladder not image), status post sphincterotomy with recovery of small pieces of stone material, status post balloon occlusion cholangiogram.   ERCP  01/07/2012   Procedure: ENDOSCOPIC RETROGRADE CHOLANGIOPANCREATOGRAPHY (ERCP);  Surgeon: Claudis RAYMOND Rivet, MD;  Location: [facility];  Service: Endoscopy;  Laterality: N/A;  possible biliary stenting   ERCP  02/21/2012   Procedure: ENDOSCOPIC RETROGRADE CHOLANGIOPANCREATOGRAPHY (ERCP);  Surgeon: Claudis RAYMOND Rivet, MD;  Location: [facility];  Service: Endoscopy;  Laterality: N/A;  common bile duct stone and clip removed   ERCP N/A 05/18/2012   Procedure: ENDOSCOPIC RETROGRADE CHOLANGIOPANCREATOGRAPHY (ERCP);  Surgeon: Claudis RAYMOND Rivet, MD;  Location: [facility];  Service: Endoscopy;  Laterality: N/A;  stone and debri removal   ESOPHAGOGASTRODUODENOSCOPY  09/22/2003   Erosive esophagitis and Schatzki ring, small hiatal  hernia   ESOPHAGOGASTRODUODENOSCOPY  01/22/2008   Mild reflux esophagitis, small hiatal hernia   ESOPHAGOGASTRODUODENOSCOPY  02/01/2011   Rourk-erosive reflux esophagitis, small hiatal hernia   ESOPHAGOGASTRODUODENOSCOPY  10/26/2011   Dr. Lisa gastric submucosal petechia (bx-benign ulceration), juxta ampullary duodenal diverticulum and some mucosal edema involving 1st/2nd portion of duodenum with superficial erosions (bx-superficial ulceration/benign)   ESOPHAGOGASTRODUODENOSCOPY  02/21/2012   Procedure:  ESOPHAGOGASTRODUODENOSCOPY (EGD);  Surgeon: Claudis RAYMOND Rivet, MD;  Location: [facility];  Service: Endoscopy;  Laterality: N/A;   ESOPHAGOGASTRODUODENOSCOPY N/A 05/26/2023   Procedure: EGD (ESOPHAGOGASTRODUODENOSCOPY);  Surgeon: Eartha Flavors, Toribio, MD;  [facility];  Service: Gastroenterology;  Laterality: N/A;  1:45PM;ASA 3   EYE SURGERY     HEMOSTASIS CLIP PLACEMENT  05/26/2023   Procedure: CONTROL OF HEMORRHAGE, GI TRACT, ENDOSCOPIC, BY CLIPPING OR OVERSEWING;  Surgeon: Eartha Flavors, Toribio, MD;  [facility];  Service: Gastroenterology;;   Incomplete colonoscopy  01/22/2008   Left-sided diverticula, mid descending colon polyp, due to recurrent looping and redundancy exam was incomplete. It was felt that the mid colon was reached. Followup barium enema showed colon interposition between the liver and the diaphragm, redundant sigmoid colon but no colon mass or polyp identified.. Pathology revealed tubular adenoma.   INSERT / REPLACE / REMOVE PACEMAKER     LEFT HEART CATH AND CORONARY ANGIOGRAPHY N/A 01/18/2021   Procedure: LEFT HEART CATH AND CORONARY ANGIOGRAPHY;  Surgeon: Court Dorn PARAS, MD;  [facility];  Service: Cardiovascular;  Laterality: N/A;   PACEMAKER IMPLANT N/A 12/22/2021   Procedure: PACEMAKER IMPLANT;  Surgeon: Waddell Danelle ORN, MD;  Location: [facility];  Service: Cardiovascular;  Laterality: N/A;   POLYPECTOMY   09/20/2018   Procedure: POLYPECTOMY;  Surgeon: Rivet Claudis RAYMOND, MD;  [facility];  Service: Endoscopy;;  Hepatic flexure polyp cold snare, splenic flexure polyp   POLYPECTOMY  05/26/2023   Procedure: POLYPECTOMY, INTESTINE;  Surgeon: Eartha Flavors Toribio, MD;  [facility];  Service: Gastroenterology;;   ANNETT  01/07/2012   Procedure: ANNETT;  Surgeon: Claudis RAYMOND Rivet, MD;  [facility]: [facility];  Service: Endoscopy;  Laterality: N/A;  Extended   SPYGLASS CHOLANGIOSCOPY  02/21/2012   Procedure: SPYGLASS CHOLANGIOSCOPY;  Surgeon: Claudis RAYMOND Rivet, MD;  [facility]: [facility];  Service: Endoscopy;  Laterality: N/A;   SPYGLASS CHOLANGIOSCOPY N/A 05/18/2012   Procedure: SPYGLASS CHOLANGIOSCOPY;  Surgeon: Claudis RAYMOND Rivet, MD;  Location: [facility];  Service: Endoscopy;  Laterality: N/A;   SUBMUCOSAL INJECTION  05/26/2023   Procedure: INJECTION, SUBMUCOSAL;  Surgeon: Eartha Flavors, Toribio, MD;  [facility];  Service: Gastroenterology;;   TEE WITHOUT CARDIOVERSION N/A 03/09/2021   Procedure: TRANSESOPHAGEAL ECHOCARDIOGRAM (TEE);  Surgeon: Shyrl Linnie KIDD, MD;  Location: [facility] OR;  Service: Open Heart Surgery;  Laterality: N/A;   TOTAL KNEE ARTHROPLASTY  03/21/2012   Procedure: TOTAL KNEE ARTHROPLASTY;  Surgeon: Dempsey PARAS Sensor, MD;  Location: [facility];  Service: Orthopedics;  Laterality: Right;  right knee arthroplasty   TOTAL KNEE ARTHROPLASTY Left 04/08/2019   Procedure: LEFT TOTAL KNEE ARTHROPLASTY;  Surgeon: Sensor Dempsey, MD;  Location: [facility];  Service: Orthopedics;  Laterality: Left;   Social History   Occupational History   Occupation: Retired Careers information officer: RETIRED  Tobacco Use   Smoking status: Never   Smokeless tobacco: Never  Vaping Use   Vaping status: Never Used  Substance and Sexual Activity   Alcohol use: No    Alcohol/week: 0.0 standard drinks of alcohol   Drug use: No   Sexual activity: Not on file

## 2023-12-25 ENCOUNTER — Encounter: Payer: Self-pay | Admitting: Radiology

## 2023-12-25 ENCOUNTER — Ambulatory Visit (INDEPENDENT_AMBULATORY_CARE_PROVIDER_SITE_OTHER): Payer: PPO

## 2023-12-25 DIAGNOSIS — I4819 Other persistent atrial fibrillation: Secondary | ICD-10-CM | POA: Diagnosis not present

## 2023-12-25 LAB — CUP PACEART REMOTE DEVICE CHECK
Battery Remaining Longevity: 103 mo
Battery Remaining Percentage: 83 %
Battery Voltage: 3.01 V
Brady Statistic AP VP Percent: 1.9 %
Brady Statistic AP VS Percent: 75 %
Brady Statistic AS VP Percent: 1.1 %
Brady Statistic AS VS Percent: 19 %
Brady Statistic RA Percent Paced: 70 %
Brady Statistic RV Percent Paced: 3 %
Date Time Interrogation Session: 20251103010014
Implantable Lead Connection Status: 753985
Implantable Lead Connection Status: 753985
Implantable Lead Implant Date: 20231101
Implantable Lead Implant Date: 20231101
Implantable Lead Location: 753859
Implantable Lead Location: 753860
Implantable Pulse Generator Implant Date: 20231101
Lead Channel Impedance Value: 400 Ohm
Lead Channel Impedance Value: 460 Ohm
Lead Channel Pacing Threshold Amplitude: 0.375 V
Lead Channel Pacing Threshold Amplitude: 0.75 V
Lead Channel Pacing Threshold Pulse Width: 0.5 ms
Lead Channel Pacing Threshold Pulse Width: 0.5 ms
Lead Channel Sensing Intrinsic Amplitude: 12 mV
Lead Channel Sensing Intrinsic Amplitude: 5 mV
Lead Channel Setting Pacing Amplitude: 1 V
Lead Channel Setting Pacing Amplitude: 1.375
Lead Channel Setting Pacing Pulse Width: 0.5 ms
Lead Channel Setting Sensing Sensitivity: 2 mV
Pulse Gen Model: 2272
Pulse Gen Serial Number: 8115159

## 2023-12-27 NOTE — Progress Notes (Signed)
 Remote PPM Transmission

## 2023-12-28 ENCOUNTER — Ambulatory Visit: Payer: Self-pay | Admitting: Internal Medicine

## 2023-12-28 DIAGNOSIS — H47091 Other disorders of optic nerve, not elsewhere classified, right eye: Secondary | ICD-10-CM | POA: Diagnosis not present

## 2023-12-28 DIAGNOSIS — H40013 Open angle with borderline findings, low risk, bilateral: Secondary | ICD-10-CM | POA: Diagnosis not present

## 2023-12-28 DIAGNOSIS — H04123 Dry eye syndrome of bilateral lacrimal glands: Secondary | ICD-10-CM | POA: Diagnosis not present

## 2023-12-29 DIAGNOSIS — E782 Mixed hyperlipidemia: Secondary | ICD-10-CM | POA: Diagnosis not present

## 2023-12-29 DIAGNOSIS — M25511 Pain in right shoulder: Secondary | ICD-10-CM | POA: Diagnosis not present

## 2023-12-29 DIAGNOSIS — Z8546 Personal history of malignant neoplasm of prostate: Secondary | ICD-10-CM | POA: Diagnosis not present

## 2023-12-29 DIAGNOSIS — Z79890 Hormone replacement therapy (postmenopausal): Secondary | ICD-10-CM | POA: Diagnosis not present

## 2023-12-29 DIAGNOSIS — I129 Hypertensive chronic kidney disease with stage 1 through stage 4 chronic kidney disease, or unspecified chronic kidney disease: Secondary | ICD-10-CM | POA: Diagnosis not present

## 2023-12-29 DIAGNOSIS — N1832 Chronic kidney disease, stage 3b: Secondary | ICD-10-CM | POA: Diagnosis not present

## 2023-12-29 DIAGNOSIS — R7303 Prediabetes: Secondary | ICD-10-CM | POA: Diagnosis not present

## 2023-12-29 DIAGNOSIS — Z79899 Other long term (current) drug therapy: Secondary | ICD-10-CM | POA: Diagnosis not present

## 2023-12-29 DIAGNOSIS — Z7182 Exercise counseling: Secondary | ICD-10-CM | POA: Diagnosis not present

## 2023-12-29 DIAGNOSIS — R5383 Other fatigue: Secondary | ICD-10-CM | POA: Diagnosis not present

## 2023-12-29 DIAGNOSIS — R809 Proteinuria, unspecified: Secondary | ICD-10-CM | POA: Diagnosis not present

## 2023-12-29 DIAGNOSIS — E039 Hypothyroidism, unspecified: Secondary | ICD-10-CM | POA: Diagnosis not present

## 2024-01-04 ENCOUNTER — Encounter (INDEPENDENT_AMBULATORY_CARE_PROVIDER_SITE_OTHER): Payer: Self-pay | Admitting: Gastroenterology

## 2024-01-08 DIAGNOSIS — N3021 Other chronic cystitis with hematuria: Secondary | ICD-10-CM | POA: Diagnosis not present

## 2024-01-08 DIAGNOSIS — N35012 Post-traumatic membranous urethral stricture: Secondary | ICD-10-CM | POA: Diagnosis not present

## 2024-01-08 DIAGNOSIS — R310 Gross hematuria: Secondary | ICD-10-CM | POA: Diagnosis not present

## 2024-01-08 DIAGNOSIS — N35819 Other urethral stricture, male, unspecified site: Secondary | ICD-10-CM | POA: Diagnosis not present

## 2024-01-08 DIAGNOSIS — Z8546 Personal history of malignant neoplasm of prostate: Secondary | ICD-10-CM | POA: Diagnosis not present

## 2024-01-26 ENCOUNTER — Ambulatory Visit: Admitting: Cardiology

## 2024-01-26 DIAGNOSIS — E039 Hypothyroidism, unspecified: Secondary | ICD-10-CM | POA: Diagnosis not present

## 2024-01-29 ENCOUNTER — Encounter: Payer: Self-pay | Admitting: Cardiology

## 2024-01-29 ENCOUNTER — Ambulatory Visit: Attending: Cardiology | Admitting: Cardiology

## 2024-01-29 VITALS — BP 130/70 | HR 70 | Ht 74.0 in | Wt 244.4 lb

## 2024-01-29 DIAGNOSIS — I493 Ventricular premature depolarization: Secondary | ICD-10-CM

## 2024-01-29 DIAGNOSIS — I10 Essential (primary) hypertension: Secondary | ICD-10-CM

## 2024-01-29 DIAGNOSIS — I25119 Atherosclerotic heart disease of native coronary artery with unspecified angina pectoris: Secondary | ICD-10-CM

## 2024-01-29 DIAGNOSIS — I495 Sick sinus syndrome: Secondary | ICD-10-CM | POA: Diagnosis not present

## 2024-01-29 DIAGNOSIS — I4819 Other persistent atrial fibrillation: Secondary | ICD-10-CM | POA: Diagnosis not present

## 2024-01-29 DIAGNOSIS — E782 Mixed hyperlipidemia: Secondary | ICD-10-CM

## 2024-01-29 NOTE — Patient Instructions (Signed)
Medication Instructions:   Your physician recommends that you continue on your current medications as directed. Please refer to the Current Medication list given to you today.  Labwork:  none  Testing/Procedures:  none  Follow-Up:  Your physician recommends that you schedule a follow-up appointment in: 6 months in [place].  Any Other Special Instructions Will Be Listed Below (If Applicable).  If you need a refill on your cardiac medications before your next appointment, please call your pharmacy. 

## 2024-01-29 NOTE — Progress Notes (Signed)
    Cardiology Office Note  Date: 01/29/2024   ID: [name], DOB February 12, 1941, MRN [id number]  History of Present Illness: [name] is an 83 y.o. male last seen in June.  He is here for a follow-up visit.  States that he does not have the get up and go that he used to have, but no definite description of angina or increasing palpitations.  No fluid retention.  St. Jude pacemaker in place with follow-up by Dr. Waddell. Device check in November revealed normal function.  I went over his medications today.  Blood pressure rechecked by me at 130/70.  No spontaneous bleeding problems noted on Eliquis .  I reviewed his interval lab work which is noted below.  ECG today shows an atrial paced rhythm with right branch block and left anterior fascicular block.  Physical Exam: VS:  BP 130/70 (BP Location: Right Arm)   Pulse 70   Ht 6' 2 (1.88 m)   Wt 244 lb 6.4 oz (110.9 kg)   SpO2 97%   BMI 31.38 kg/m , BMI Body mass index is 31.38 kg/m.  Wt Readings from Last 3 Encounters:  01/29/24 244 lb 6.4 oz (110.9 kg)  08/15/23 240 lb 6.4 oz (109 kg)  08/09/23 240 lb 3.2 oz (109 kg)    General: Patient appears comfortable at rest. HEENT: Conjunctiva and lids normal. Neck: Supple, no elevated JVP or carotid bruits. Lungs: Clear to auscultation, nonlabored breathing at rest. Cardiac: Regular rate and rhythm, no S3, 1/6 systolic murmur. Extremities: No pitting edema.  ECG:  An ECG dated 02/13/2023 was personally reviewed today and demonstrated:  Atrial paced rhythm with right bundle branch block and left anterior fascicular block, PVCs.  Labwork: 11/20/2023: Creatinine, Ser 1.90  October 2025: Hemoglobin 14.8, platelets 163, TSH 7.29 with free T4 1.39, BUN 28, creatinine 1.86, GFR 35, potassium 5, AST 27, ALT 16, cholesterol 117, triglycerides 91, HDL 40, LDL 59, hemoglobin A1c 5.8%  Other Studies Reviewed Today:  No interval cardiac testing for review today.  Assessment and Plan:  1.   CAD status post CABG in January 2023 including LIMA to LAD and SVG to PDA.  LVEF 55 to 60% by echocardiogram in September 2023.  No increasing angina on current regimen which includes Lipitor 20 mg daily and Zetia 10 mg daily.  ECG reviewed and stable.   2.  Sinus node dysfunction with symptomatic bradycardia status post St. Jude pacemaker in November 2023 with followed by Dr. Waddell.  Device interrogation normal in November.   3.  Paroxysmal atrial fibrillation with CHA2DS2-VASc score of at least 5.  He is on Eliquis  2.5 mg twice daily for stroke prophylaxis.  Lab work from October reviewed.  No spontaneous bleeding problems.   4.  Frequent PVCs, 7% rhythm burden by cardiac monitor in September 2023.  No syncope.   5.  Primary hypertension.  Continue current regimen including Norvasc  2.5 mg daily.   6.  CKD stage IIIb.  Creatinine 1.86 with GFR 35.   7.  Mixed hyperlipidemia.  LDL 59 in October.  Continue Lipitor 20 mg daily and Zetia 10 mg daily.  Disposition:  Follow up 6 months.  Signed, Jayson JUDITHANN Sierras, M.D., F.A.C.C. [facility] at [facility]

## 2024-02-02 ENCOUNTER — Ambulatory Visit: Attending: Internal Medicine | Admitting: Internal Medicine

## 2024-02-02 ENCOUNTER — Encounter: Payer: Self-pay | Admitting: Internal Medicine

## 2024-02-02 VITALS — BP 138/74 | HR 60 | Ht 75.0 in | Wt 242.8 lb

## 2024-02-02 DIAGNOSIS — I480 Paroxysmal atrial fibrillation: Secondary | ICD-10-CM | POA: Diagnosis not present

## 2024-02-02 LAB — CUP PACEART INCLINIC DEVICE CHECK
Battery Remaining Longevity: 103 mo
Battery Voltage: 3.01 V
Brady Statistic RA Percent Paced: 71 %
Brady Statistic RV Percent Paced: 2.8 %
Date Time Interrogation Session: 20251212141643
Implantable Lead Connection Status: 753985
Implantable Lead Connection Status: 753985
Implantable Lead Implant Date: 20231101
Implantable Lead Implant Date: 20231101
Implantable Lead Location: 753859
Implantable Lead Location: 753860
Implantable Pulse Generator Implant Date: 20231101
Lead Channel Impedance Value: 400 Ohm
Lead Channel Impedance Value: 475 Ohm
Lead Channel Pacing Threshold Amplitude: 0.5 V
Lead Channel Pacing Threshold Amplitude: 0.5 V
Lead Channel Pacing Threshold Amplitude: 0.75 V
Lead Channel Pacing Threshold Amplitude: 0.75 V
Lead Channel Pacing Threshold Pulse Width: 0.5 ms
Lead Channel Pacing Threshold Pulse Width: 0.5 ms
Lead Channel Pacing Threshold Pulse Width: 0.5 ms
Lead Channel Pacing Threshold Pulse Width: 0.5 ms
Lead Channel Sensing Intrinsic Amplitude: 12 mV
Lead Channel Sensing Intrinsic Amplitude: 5 mV
Lead Channel Setting Pacing Amplitude: 1 V
Lead Channel Setting Pacing Amplitude: 1.375
Lead Channel Setting Pacing Pulse Width: 0.5 ms
Lead Channel Setting Sensing Sensitivity: 2 mV
Pulse Gen Model: 2272
Pulse Gen Serial Number: 8115159

## 2024-02-02 NOTE — Patient Instructions (Signed)
 Medication Instructions:  Your physician recommends that you continue on your current medications as directed. Please refer to the Current Medication list given to you today.   Labwork: None today  Testing/Procedures: None today  Follow-Up: 1 year Dr.Carlisle  Any Other Special Instructions Will Be Listed Below (If Applicable).  If you need a refill on your cardiac medications before your next appointment, please call your pharmacy.

## 2024-02-02 NOTE — Progress Notes (Signed)
 HPI [name] returns for evaluation of symptomatic bradycardia due to sinus node dysfunction,  s/p PPM insertion. He is a pleasant 83 yo man with CAD, s/p CABG in 1/23.  He has PAF which is well controlled. He has class 2 dyspnea. He denies anginal symptoms.  He especially notes difficulty with exertion. He has had a couple of episodes where he was working outside and passed out. His amlopine was reduced. Since his last visit he notes he feels well.  Allergies[1]   Current Outpatient Medications  Medication Sig Dispense Refill   acetaminophen  (TYLENOL ) 500 MG tablet Take 500 mg by mouth every 6 (six) hours as needed (for pain.).     amLODipine  (NORVASC ) 2.5 MG tablet TAKE ONE (1) TABLET BY MOUTH EVERY DAY 90 tablet 2   apixaban  (ELIQUIS ) 2.5 MG TABS tablet TAKE ONE TABLET (2.5 MG TOTAL) BY MOUTH TWO TIMES DAILY 180 tablet 1   atorvastatin  (LIPITOR) 20 MG tablet TAKE ONE (1) TABLET BY MOUTH EVERY DAY 90 tablet 3   Docusate Sodium  (COLACE PO) Take by mouth. Takes 3 BID.     ezetimibe (ZETIA) 10 MG tablet Take 10 mg by mouth at bedtime.     latanoprost (XALATAN) 0.005 % ophthalmic solution SMARTSIG:In Eye(s)     levothyroxine (SYNTHROID) 25 MCG tablet Take 25 mcg by mouth daily.     LINZESS  145 MCG CAPS capsule Take 145 mcg by mouth daily.     LUMIGAN 0.01 % SOLN SMARTSIG:In Eye(s)     pantoprazole  (PROTONIX ) 40 MG tablet Take 1 tablet (40 mg total) by mouth 2 (two) times daily. 180 tablet 1   timolol (TIMOPTIC) 0.5 % ophthalmic solution SMARTSIG:In Eye(s)     trimethoprim  (TRIMPEX ) 100 MG tablet Take 1 tablet (100 mg total) by mouth at bedtime. 90 tablet 3   Vibegron  (GEMTESA ) 75 MG TABS Take 1 tablet (75 mg total) by mouth daily. 42 tablet 0   No current facility-administered medications for this visit.     Past Medical History:  Diagnosis Date   Arthritis    CAD (coronary artery disease)    Two-vessel obstructive disease November 2022; CABG with LIMA to LAD and SVG to PDA  January 2023   Chronic back pain    CKD (chronic kidney disease) stage 3, GFR 30-59 ml/min (HCC)    Diastolic dysfunction    a. 10/2021 Echo: EF 55-60%, no rwma, GrI DD, nl RV fxn. Mildly dil LA. Triv MR/AI. AoV sclerosis w/o stenosis. Asc Ao 38mm.   Essential hypertension    GERD (gastroesophageal reflux disease)    Helicobacter pylori gastritis    Treated   History of kidney stones    Hx of adenomatous colonic polyps    IBS (irritable bowel syndrome)    PAF (paroxysmal atrial fibrillation) (HCC)    Prostate CA (HCC)    Seed implants   Schatzki's ring    Sinus node dysfunction (HCC)    a. 12/2021 s/p SJM PPM.   Stroke (HCC)    left hand numbness at times    ROS:   All systems reviewed and negative except as noted in the HPI.   Past Surgical History:  Procedure Laterality Date   APPENDECTOMY  age 9   BACK SURGERY  03/24/2006   [facility]   BACK SURGERY  09/24/2004   [facility], [facility]   BACK SURGERY  09/18/2002   [facility]   BILIARY STENT PLACEMENT  01/07/2012   Procedure: BILIARY STENT PLACEMENT;  Surgeon:  Claudis RAYMOND Rivet, MD;  Location: [facility];  Service: Endoscopy;  Laterality: N/A;   BILIARY STENT PLACEMENT  02/21/2012   Procedure: BILIARY STENT PLACEMENT;  Surgeon: Claudis RAYMOND Rivet, MD;  Location: [facility];  Service: Endoscopy;  Laterality: N/A;  Biliary Stent Replacement   CATARACT EXTRACTION W/PHACO  11/22/2010   Procedure: CATARACT EXTRACTION PHACO AND INTRAOCULAR LENS PLACEMENT (IOC);  Surgeon: Dow JULIANNA Burke;  Location: [facility];  Service: Ophthalmology;  Laterality: Right;  CDE: 6.81   CHOLECYSTECTOMY  03/04/2005   [facility], [facility]. Gangrenous cholecystitis complicated by abscess requiring percutaneous drainage. He also had common duct stones requiring ERCP and sphincterotomy.   COLONOSCOPY  09/22/2003   Scattered sigmoid diverticulosis, splenic flexure polyp. Hyperplastic   COLONOSCOPY  02/22/1995   3 cm tubular adenoma and a sending colon   COLONOSCOPY  02/01/2011   Rourk-friable  anal canal, hyperplastic rectal polyp   COLONOSCOPY N/A 08/15/2013   Procedure: COLONOSCOPY;  Surgeon: Claudis RAYMOND Rivet, MD;  [facility];  Service: Endoscopy;  Laterality: N/A;  100   COLONOSCOPY N/A 09/20/2018   Procedure: COLONOSCOPY;  Surgeon: Rivet Claudis RAYMOND, MD;  [facility];  Service: Endoscopy;  Laterality: N/A;  12:00pm   COLONOSCOPY N/A 05/26/2023   Procedure: COLONOSCOPY;  Surgeon: Eartha Angelia Sieving, MD;  [facility];  Service: Gastroenterology;  Laterality: N/A;  1:45PM;ASA 3   CORONARY ARTERY BYPASS GRAFT N/A 03/09/2021   Procedure: OFF PUMP CORONARY ARTERY BYPASS GRAFTING (CABG) X2, USING LEFT INTERNAL MAMMARY ARTERY AND RIGHT LEG GREATER SAPHENOUS VEIN HARVESTED ENDOSCOPICALLY;  Surgeon: Shyrl Linnie KIDD, MD;  Location: [facility];  Service: Open Heart Surgery;  Laterality: N/A;  X 2   ERCP  03/02/2005   [facility], [facility]. Normal-appearing biliary tree (gallbladder not image), status post sphincterotomy with recovery of small pieces of stone material, status post balloon occlusion cholangiogram.   ERCP  01/07/2012   Procedure: ENDOSCOPIC RETROGRADE CHOLANGIOPANCREATOGRAPHY (ERCP);  Surgeon: Claudis RAYMOND Rivet, MD;  Location: [facility];  Service: Endoscopy;  Laterality: N/A;  possible biliary stenting   ERCP  02/21/2012   Procedure: ENDOSCOPIC RETROGRADE CHOLANGIOPANCREATOGRAPHY (ERCP);  Surgeon: Claudis RAYMOND Rivet, MD;  Location: [facility];  Service: Endoscopy;  Laterality: N/A;  common bile duct stone and clip removed   ERCP N/A 05/18/2012   Procedure: ENDOSCOPIC RETROGRADE CHOLANGIOPANCREATOGRAPHY (ERCP);  Surgeon: Claudis RAYMOND Rivet, MD;  Location: [facility];  Service: Endoscopy;  Laterality: N/A;  stone and debri removal   ESOPHAGOGASTRODUODENOSCOPY  09/22/2003   Erosive esophagitis and Schatzki ring, small hiatal hernia   ESOPHAGOGASTRODUODENOSCOPY  01/22/2008   Mild reflux esophagitis, small hiatal hernia   ESOPHAGOGASTRODUODENOSCOPY  02/01/2011    Rourk-erosive reflux esophagitis, small hiatal hernia   ESOPHAGOGASTRODUODENOSCOPY  10/26/2011   Dr. Lisa gastric submucosal petechia (bx-benign ulceration), juxta ampullary duodenal diverticulum and some mucosal edema involving 1st/2nd portion of duodenum with superficial erosions (bx-superficial ulceration/benign)   ESOPHAGOGASTRODUODENOSCOPY  02/21/2012   Procedure: ESOPHAGOGASTRODUODENOSCOPY (EGD);  Surgeon: Claudis RAYMOND Rivet, MD;  Location: [facility];  Service: Endoscopy;  Laterality: N/A;   ESOPHAGOGASTRODUODENOSCOPY N/A 05/26/2023   Procedure: EGD (ESOPHAGOGASTRODUODENOSCOPY);  Surgeon: Eartha Angelia, Sieving, MD;  [facility];  Service: Gastroenterology;  Laterality: N/A;  1:45PM;ASA 3   EYE SURGERY     HEMOSTASIS CLIP PLACEMENT  05/26/2023   Procedure: CONTROL OF HEMORRHAGE, GI TRACT, ENDOSCOPIC, BY CLIPPING OR OVERSEWING;  Surgeon: Eartha Angelia, Sieving, MD;  [facility];  Service: Gastroenterology;;   Incomplete colonoscopy  01/22/2008  Left-sided diverticula, mid descending colon polyp, due to recurrent looping and redundancy exam was incomplete. It was felt that the mid colon was reached. Followup barium enema showed colon interposition between the liver and the diaphragm, redundant sigmoid colon but no colon mass or polyp identified.. Pathology revealed tubular adenoma.   INSERT / REPLACE / REMOVE PACEMAKER     LEFT HEART CATH AND CORONARY ANGIOGRAPHY N/A 01/18/2021   Procedure: LEFT HEART CATH AND CORONARY ANGIOGRAPHY;  Surgeon: Court Dorn PARAS, MD;  [facility]: [facility];  Service: Cardiovascular;  Laterality: N/A;   PACEMAKER IMPLANT N/A 12/22/2021   Procedure: PACEMAKER IMPLANT;  Surgeon: Waddell Danelle ORN, MD;  [facility];  Service: Cardiovascular;  Laterality: N/A;   POLYPECTOMY  09/20/2018   Procedure: POLYPECTOMY;  Surgeon: Golda Claudis PENNER, MD;  [facility];  Service: Endoscopy;;  Hepatic flexure polyp  cold snare, splenic flexure polyp   POLYPECTOMY  05/26/2023   Procedure: POLYPECTOMY, INTESTINE;  Surgeon: Eartha Angelia Sieving, MD;  [facility];  Service: Gastroenterology;;   ANNETT  01/07/2012   Procedure: ANNETT;  Surgeon: Claudis PENNER Golda, MD;  Location: [facility];  Service: Endoscopy;  Laterality: N/A;  Extended   SPYGLASS CHOLANGIOSCOPY  02/21/2012   Procedure: SPYGLASS CHOLANGIOSCOPY;  Surgeon: Claudis PENNER Golda, MD;  Location: [facility];  Service: Endoscopy;  Laterality: N/A;   SPYGLASS CHOLANGIOSCOPY N/A 05/18/2012   Procedure: SPYGLASS CHOLANGIOSCOPY;  Surgeon: Claudis PENNER Golda, MD;  [facility]: [facility];  Service: Endoscopy;  Laterality: N/A;   SUBMUCOSAL INJECTION  05/26/2023   Procedure: INJECTION, SUBMUCOSAL;  Surgeon: Eartha Angelia, Sieving, MD;  [facility];  Service: Gastroenterology;;   TEE WITHOUT CARDIOVERSION N/A 03/09/2021   Procedure: TRANSESOPHAGEAL ECHOCARDIOGRAM (TEE);  Surgeon: Shyrl Linnie KIDD, MD;  Location[facility];  Service: Open Heart Surgery;  Laterality: N/A;   TOTAL KNEE ARTHROPLASTY  03/21/2012   Procedure: TOTAL KNEE ARTHROPLASTY;  Surgeon: Dempsey PARAS Sensor, MD;  Location: [facility];  Service: Orthopedics;  Laterality: Right;  right knee arthroplasty   TOTAL KNEE ARTHROPLASTY Left 04/08/2019   Procedure: LEFT TOTAL KNEE ARTHROPLASTY;  Surgeon: Sensor Dempsey, MD;  Location: [facility];  Service: Orthopedics;  Laterality: Left;     Family History  Problem Relation Age of Onset   Diabetes Mother    Coronary artery disease Mother    Coronary artery disease Father    Diabetes Father    Stomach cancer Father    Deep vein thrombosis Daughter    Hypotension Neg Hx    Anesthesia problems Neg Hx    Malignant hyperthermia Neg Hx    Pseudochol deficiency Neg Hx    Colon cancer Neg Hx      Social History   Socioeconomic History   Marital status: Married    Spouse name: Not on file   Number of children: 2   Years of education: Not  on file   Highest education level: Not on file  Occupational History   Occupation: Retired Careers Information Officer: RETIRED  Tobacco Use   Smoking status: Never   Smokeless tobacco: Never  Vaping Use   Vaping status: Never Used  Substance and Sexual Activity   Alcohol use: No    Alcohol/week: 0.0 standard drinks of alcohol   Drug use: No   Sexual activity: Not on file  Other Topics Concern   Not on file  Social History Narrative   Not on file   Social Drivers of  Health   Tobacco Use: Low Risk (02/02/2024)   Patient History    Smoking Tobacco Use: Never    Smokeless Tobacco Use: Never    Passive Exposure: Not on file  Financial Resource Strain: Not on file  Food Insecurity: Not on file  Transportation Needs: Not on file  Physical Activity: Not on file  Stress: Not on file  Social Connections: Unknown (10/12/2022)   Received from [facility]   Social Network    Social Network: Not on file  Intimate Partner Violence: Unknown (10/12/2022)   Received from [facility]   HITS    Physically Hurt: Not on file    Insult or Talk Down To: Not on file    Threaten Physical Harm: Not on file    Scream or Curse: Not on file  Depression (PHQ2-9): Low Risk (08/16/2021)   Depression (PHQ2-9)    PHQ-2 Score: 3  Alcohol Screen: Not on file  Housing: Not on file  Utilities: Not on file  Health Literacy: Not on file     BP 138/74 (BP Location: Left Arm)   Pulse 60   Ht 6' 3 (1.905 m)   Wt 242 lb 12.8 oz (110.1 kg)   SpO2 96%   BMI 30.35 kg/m   Physical Exam:  Well appearing NAD HEENT: Unremarkable Neck:  No JVD, no thyromegally Lymphatics:  No adenopathy Back:  No CVA tenderness Lungs:  Clear with no wheezes HEART:  Regular rate rhythm, no murmurs, no rubs, no clicks Abd:  soft, positive bowel sounds, no organomegally, no rebound, no guarding Ext:  2 plus pulses, no edema, no cyanosis, no clubbing Skin:  No rashes no nodules Neuro:  CN II through XII intact, motor  grossly intact  EKG  DEVICE  Normal device function.  See PaceArt for details.   A/P PAF - he is mostly maintaining NSR. I have discussed stopping his eliquis  but recommend he continue.   Symptomatic sinus node dysfunction - he has undergone PPM insertion.     3. PVC's - these appear to be better controlled.   4. CAD - he is s/p CABG and denies anginal symptoms. We will follow.   5. Syncope - none since his amlodipine  was reduced.   Danelle Jolynne Spurgin,MD    [1] No Known Allergies

## 2024-02-13 ENCOUNTER — Ambulatory Visit (INDEPENDENT_AMBULATORY_CARE_PROVIDER_SITE_OTHER): Admitting: Gastroenterology

## 2024-02-13 ENCOUNTER — Encounter (INDEPENDENT_AMBULATORY_CARE_PROVIDER_SITE_OTHER): Payer: Self-pay | Admitting: Gastroenterology

## 2024-02-13 VITALS — BP 144/72 | HR 60 | Temp 97.1°F | Ht 74.0 in | Wt 241.7 lb

## 2024-02-13 DIAGNOSIS — K581 Irritable bowel syndrome with constipation: Secondary | ICD-10-CM

## 2024-02-13 DIAGNOSIS — K219 Gastro-esophageal reflux disease without esophagitis: Secondary | ICD-10-CM

## 2024-02-13 NOTE — Patient Instructions (Signed)
-  decrease protonix  40mg  to once daily -start bentyl 10mg  up to three times per day as needed for lower abdominal pain  -Increase water  intake, aim for atleast 64 oz per day -Increase fruits, veggies and whole grains, kiwi and prunes are especially good for constipation -continue linzess  145mcg as needed for constipation  Follow up 4 months  It was a pleasure to see you today. I want to create trusting relationships with patients and provide genuine, compassionate, and quality care. I truly value your feedback! please be on the lookout for a survey regarding your visit with me today. I appreciate your input about our visit and your time in completing this!    Jasher Barkan L. Zyanne Schumm, MSN, APRN, AGNP-C Adult-Gerontology Nurse Practitioner [facility]

## 2024-02-13 NOTE — Progress Notes (Signed)
 "  Referring Provider: Shona Norleen PEDLAR, MD Primary Care Physician:  Shona Norleen PEDLAR, MD Primary GI Physician: Dr. Eartha   Chief Complaint  Patient presents with   Follow-up    Patient here today for a follow up on Constipation and lower abdominal pain has gotten better. He reports he has not needed linzess , but when he does it is just prn. He says the reflux is under control with pantoprazole  40 mg bid.    HPI:   [name] is a 83 y.o. male with past medical history of f prostate cancer s/p radiation seeds, CKD, hypertension, GERD, IBS, Schatzki's ring   Patient presenting today for:  Follow up of GERD and IBS with constipation  Lat seen June, at that time taking amitiza  a few times per week, cannot take BID due to it causing diarrhea, some ongoing abdominal pain in lower abdomen, improved some with defecation. Taking protonix  40mg  BID, epigastric pain improved, GERD well managed  Recommended continue PPI BID, repeat TCS in April 2026, discuss need for CT Angio with Dr Eartha (can hold off on imaging unless abdominal pain recurs), avoid nsaids, start linzess  145 mcg Daily, pt to consider low dose bentyl for abdominal discomfort  Present:  Feels constipation has improved some on its own. No changes in diet or medications. He takes linzess  only PRN, has not taken this in about 3 months. Has a BM about every 2 days and goes pretty easily without straining. Lower abdominal pain has improved some, not as severe as previously. Sometimes having a BM helps his pain. He endorses some gas at times but takes phazyme which seems to work well for him. Does not think that eating brings on his pain. Denies rectal bleeding or melena.   GERD is well managed on protonix  40mg  BID. He inquires how long he will have to stay on this. He denies any GERD symptoms. Epigastric pain or dysphagia.    CT A/P with contrast: 11/20/23  1. No acute abnormality. 2. Sigmoid colon diverticulosis without evidence of  diverticulitis. 3. Small bilateral inguinal hernias containing fat. 4. Calcific coronary artery and aortic atherosclerosis.  Colonoscopy:05/2023 - Two 4 to 6 mm polyps in the cecum, removed with a                            cold snare. Resected and retrieved.                           - One 15 mm polyp in the ascending colon, removed                            with a cold snare. Resected via EMR and retrieved.                            Clip manufacturer: [employer]. Clips (MR                            safe) were placed.                           - Seven 3 to 10 mm polyps in the transverse colon  and in the ascending colon, removed with a cold                            snare. Resected and retrieved.                           - There was significant looping of the colon.                           - Diverticulosis in the transverse colon and in the                            ascending colon.                           - Non-bleeding internal hemorrhoids.   EGD: 05/2023- Normal esophagus.                           - Normal stomach. Biopsied.                           - Non-bleeding duodenal ulcers with a clean ulcer                            base (Forrest Class III). Biopsied.   A. SMALL BOWEL, BIOPSY:       Duodenal mucosa with foveolar metaplasia and Brunner gland  hyperplasia.      Negative for dysplasia.   B. GASTRIC, BIOPSY:       Gastric antral / oxyntic mucosa without diagnostic alteration.       No H. pylori identified on HE stain.       Negative for intestinal metaplasia or dysplasia.   C. COLON, CECAL, ASCENDING, TRANSVERSE, POLYPECTOMY:       Fragments of tubular adenoma.       Negative for high-grade dysplasia.    CT renal stone study: 04/2022 No findings to explain the patient's clinical history. 2. No evidence of metastatic disease. 3. Left renal stone.  4.  Aortic atherosclerosis (ICD10-I70.0). 5. Enlarged pulmonic trunk, indicative  of pulmonary arterial hypertension.   Repeat TCS 1 year  Filed Weights   02/13/24 1109  Weight: 241 lb 11.2 oz (109.6 kg)     Past Medical History:  Diagnosis Date   Arthritis    CAD (coronary artery disease)    Two-vessel obstructive disease November 2022; CABG with LIMA to LAD and SVG to PDA January 2023   Chronic back pain    CKD (chronic kidney disease) stage 3, GFR 30-59 ml/min (HCC)    Diastolic dysfunction    a. 10/2021 Echo: EF 55-60%, no rwma, GrI DD, nl RV fxn. Mildly dil LA. Triv MR/AI. AoV sclerosis w/o stenosis. Asc Ao 38mm.   Essential hypertension    GERD (gastroesophageal reflux disease)    Helicobacter pylori gastritis    Treated   History of kidney stones    Hx of adenomatous colonic polyps    IBS (irritable bowel syndrome)    PAF (paroxysmal atrial fibrillation) (HCC)    Prostate CA (HCC)    Seed implants   Schatzki's ring    Sinus node dysfunction (HCC)    a.  12/2021 s/p SJM PPM.   Stroke [facility])    left hand numbness at times    Past Surgical History:  Procedure Laterality Date   APPENDECTOMY  age 26   BACK SURGERY  03/24/2006   [facility]   BACK SURGERY  09/24/2004   [facility], [facility]   BACK SURGERY  09/18/2002   [facility]   BILIARY STENT PLACEMENT  01/07/2012   Procedure: BILIARY STENT PLACEMENT;  Surgeon: Claudis RAYMOND Rivet, MD;  Location: [facility];  Service: Endoscopy;  Laterality: N/A;   BILIARY STENT PLACEMENT  02/21/2012   Procedure: BILIARY STENT PLACEMENT;  Surgeon: Claudis RAYMOND Rivet, MD;  Location: [facility];  Service: Endoscopy;  Laterality: N/A;  Biliary Stent Replacement   CATARACT EXTRACTION W/PHACO  11/22/2010   Procedure: CATARACT EXTRACTION PHACO AND INTRAOCULAR LENS PLACEMENT (IOC);  Surgeon: Dow JULIANNA Burke;  Location: [facility];  Service: Ophthalmology;  Laterality: Right;  CDE: 6.81   CHOLECYSTECTOMY  03/04/2005   [facility], [facility]. Gangrenous cholecystitis complicated by abscess requiring percutaneous drainage. He also had common duct stones requiring ERCP  and sphincterotomy.   COLONOSCOPY  09/22/2003   Scattered sigmoid diverticulosis, splenic flexure polyp. Hyperplastic   COLONOSCOPY  02/22/1995   3 cm tubular adenoma and a sending colon   COLONOSCOPY  02/01/2011   Rourk-friable anal canal, hyperplastic rectal polyp   COLONOSCOPY N/A 08/15/2013   Procedure: COLONOSCOPY;  Surgeon: Claudis RAYMOND Rivet, MD;  Location: [facility];  Service: Endoscopy;  Laterality: N/A;  100   COLONOSCOPY N/A 09/20/2018   Procedure: COLONOSCOPY;  Surgeon: Rivet Claudis RAYMOND, MD;  Location: [facility];  Service: Endoscopy;  Laterality: N/A;  12:00pm   COLONOSCOPY N/A 05/26/2023   Procedure: COLONOSCOPY;  Surgeon: Eartha Angelia Sieving, MD;  Location: [facility];  Service: Gastroenterology;  Laterality: N/A;  1:45PM;ASA 3   CORONARY ARTERY BYPASS GRAFT N/A 03/09/2021   Procedure: OFF PUMP CORONARY ARTERY BYPASS GRAFTING (CABG) X2, USING LEFT INTERNAL MAMMARY ARTERY AND RIGHT LEG GREATER SAPHENOUS VEIN HARVESTED ENDOSCOPICALLY;  Surgeon: Shyrl Linnie KIDD, MD;  Location: [facility];  Service: Open Heart Surgery;  Laterality: N/A;  X 2   ERCP  03/02/2005   [facility]. Normal-appearing biliary tree (gallbladder not image), status post sphincterotomy with recovery of small pieces of stone material, status post balloon occlusion cholangiogram.   ERCP  01/07/2012   Procedure: ENDOSCOPIC RETROGRADE CHOLANGIOPANCREATOGRAPHY (ERCP);  Surgeon: Claudis RAYMOND Rivet, MD;  Location: [facility];  Service: Endoscopy;  Laterality: N/A;  possible biliary stenting   ERCP  02/21/2012   Procedure: ENDOSCOPIC RETROGRADE CHOLANGIOPANCREATOGRAPHY (ERCP);  Surgeon: Claudis RAYMOND Rivet, MD;  Location: [facility];  Service: Endoscopy;  Laterality: N/A;  common bile duct stone and clip removed   ERCP N/A 05/18/2012   Procedure: ENDOSCOPIC RETROGRADE CHOLANGIOPANCREATOGRAPHY (ERCP);  Surgeon: Claudis RAYMOND Rivet, MD;  Location: [facility];  Service: Endoscopy;  Laterality: N/A;  stone and debri removal    ESOPHAGOGASTRODUODENOSCOPY  09/22/2003   Erosive esophagitis and Schatzki ring, small hiatal hernia   ESOPHAGOGASTRODUODENOSCOPY  01/22/2008   Mild reflux esophagitis, small hiatal hernia   ESOPHAGOGASTRODUODENOSCOPY  02/01/2011   Rourk-erosive reflux esophagitis, small hiatal hernia   ESOPHAGOGASTRODUODENOSCOPY  10/26/2011   Dr. Lisa gastric submucosal petechia (bx-benign ulceration), juxta ampullary duodenal diverticulum and some mucosal edema involving 1st/2nd portion of duodenum with superficial erosions (bx-superficial ulceration/benign)   ESOPHAGOGASTRODUODENOSCOPY  02/21/2012   Procedure: ESOPHAGOGASTRODUODENOSCOPY (EGD);  Surgeon: Claudis RAYMOND Rivet, MD;  Location: [facility];  Service: Endoscopy;  Laterality: N/A;  ESOPHAGOGASTRODUODENOSCOPY N/A 05/26/2023   Procedure: EGD (ESOPHAGOGASTRODUODENOSCOPY);  Surgeon: Eartha Flavors, Toribio, MD;  [facility]: [facility];  Service: Gastroenterology;  Laterality: N/A;  1:45PM;ASA 3   EYE SURGERY     HEMOSTASIS CLIP PLACEMENT  05/26/2023   Procedure: CONTROL OF HEMORRHAGE, GI TRACT, ENDOSCOPIC, BY CLIPPING OR OVERSEWING;  Surgeon: Eartha Flavors, Toribio, MD;  [facility];  Service: Gastroenterology;;   Incomplete colonoscopy  01/22/2008   Left-sided diverticula, mid descending colon polyp, due to recurrent looping and redundancy exam was incomplete. It was felt that the mid colon was reached. Followup barium enema showed colon interposition between the liver and the diaphragm, redundant sigmoid colon but no colon mass or polyp identified.. Pathology revealed tubular adenoma.   INSERT / REPLACE / REMOVE PACEMAKER     LEFT HEART CATH AND CORONARY ANGIOGRAPHY N/A 01/18/2021   Procedure: LEFT HEART CATH AND CORONARY ANGIOGRAPHY;  Surgeon: Court Dorn PARAS, MD;  [facility]: [facility];  Service: Cardiovascular;  Laterality: N/A;   PACEMAKER IMPLANT N/A 12/22/2021   Procedure: PACEMAKER IMPLANT;  Surgeon: Waddell Danelle ORN, MD;   [facility]: [facility];  Service: Cardiovascular;  Laterality: N/A;   POLYPECTOMY  09/20/2018   Procedure: POLYPECTOMY;  Surgeon: Golda Claudis PENNER, MD;  [facility];  Service: Endoscopy;;  Hepatic flexure polyp cold snare, splenic flexure polyp   POLYPECTOMY  05/26/2023   Procedure: POLYPECTOMY, INTESTINE;  Surgeon: Eartha Flavors Toribio, MD;  [facility];  Service: Gastroenterology;;   ANNETT  01/07/2012   Procedure: ANNETT;  Surgeon: Claudis PENNER Golda, MD;  Location: [facility];  Service: Endoscopy;  Laterality: N/A;  Extended   SPYGLASS CHOLANGIOSCOPY  02/21/2012   Procedure: SPYGLASS CHOLANGIOSCOPY;  Surgeon: Claudis PENNER Golda, MD;  Location: [facility];  Service: Endoscopy;  Laterality: N/A;   SPYGLASS CHOLANGIOSCOPY N/A 05/18/2012   Procedure: SPYGLASS CHOLANGIOSCOPY;  Surgeon: Claudis PENNER Golda, MD;  Location: [facility];  Service: Endoscopy;  Laterality: N/A;   SUBMUCOSAL INJECTION  05/26/2023   Procedure: INJECTION, SUBMUCOSAL;  Surgeon: Eartha Flavors, Toribio, MD;  [facility];  Service: Gastroenterology;;   TEE WITHOUT CARDIOVERSION N/A 03/09/2021   Procedure: TRANSESOPHAGEAL ECHOCARDIOGRAM (TEE);  Surgeon: Shyrl Linnie KIDD, MD;  Location: [facility] OR;  Service: Open Heart Surgery;  Laterality: N/A;   TOTAL KNEE ARTHROPLASTY  03/21/2012   Procedure: TOTAL KNEE ARTHROPLASTY;  Surgeon: Dempsey PARAS Sensor, MD;  Location: [facility];  Service: Orthopedics;  Laterality: Right;  right knee arthroplasty   TOTAL KNEE ARTHROPLASTY Left 04/08/2019   Procedure: LEFT TOTAL KNEE ARTHROPLASTY;  Surgeon: Sensor Dempsey, MD;  Location: [facility];  Service: Orthopedics;  Laterality: Left;    Current Outpatient Medications  Medication Sig Dispense Refill   acetaminophen  (TYLENOL ) 500 MG tablet Take 500 mg by mouth every 6 (six) hours as needed (for pain.).     amLODipine  (NORVASC ) 2.5 MG tablet TAKE ONE (1) TABLET BY MOUTH EVERY DAY 90 tablet 2   apixaban  (ELIQUIS ) 2.5 MG  TABS tablet TAKE ONE TABLET (2.5 MG TOTAL) BY MOUTH TWO TIMES DAILY 180 tablet 1   atorvastatin  (LIPITOR) 20 MG tablet TAKE ONE (1) TABLET BY MOUTH EVERY DAY 90 tablet 3   ezetimibe (ZETIA) 10 MG tablet Take 10 mg by mouth at bedtime.     latanoprost (XALATAN) 0.005 % ophthalmic solution SMARTSIG:In Eye(s) (Patient taking differently: Place 1 drop into both eyes at bedtime.)     levothyroxine (SYNTHROID) 25 MCG tablet Take 25 mcg by  mouth daily.     LINZESS  145 MCG CAPS capsule Take 145 mcg by mouth daily. (Patient taking differently: Take 145 mcg by mouth as needed.)     pantoprazole  (PROTONIX ) 40 MG tablet Take 1 tablet (40 mg total) by mouth 2 (two) times daily. 180 tablet 1   trimethoprim  (TRIMPEX ) 100 MG tablet Take 1 tablet (100 mg total) by mouth at bedtime. 90 tablet 3   No current facility-administered medications for this visit.    Allergies as of 02/13/2024   (No Known Allergies)    Social History   Socioeconomic History   Marital status: Married    Spouse name: Not on file   Number of children: 2   Years of education: Not on file   Highest education level: Not on file  Occupational History   Occupation: Retired Careers Information Officer: RETIRED  Tobacco Use   Smoking status: Never   Smokeless tobacco: Never  Vaping Use   Vaping status: Never Used  Substance and Sexual Activity   Alcohol use: No    Alcohol/week: 0.0 standard drinks of alcohol   Drug use: No   Sexual activity: Not on file  Other Topics Concern   Not on file  Social History Narrative   Not on file   Social Drivers of Health   Tobacco Use: Low Risk (02/13/2024)   Patient History    Smoking Tobacco Use: Never    Smokeless Tobacco Use: Never    Passive Exposure: Not on file  Financial Resource Strain: Not on file  Food Insecurity: Not on file  Transportation Needs: Not on file  Physical Activity: Not on file  Stress: Not on file  Social Connections: Unknown (10/12/2022)   Received from  [facility]   Social Network    Social Network: Not on file  Depression (PHQ2-9): Low Risk (08/16/2021)   Depression (PHQ2-9)    PHQ-2 Score: 3  Alcohol Screen: Not on file  Housing: Not on file  Utilities: Not on file  Health Literacy: Not on file    Review of systems General: negative for malaise, night sweats, fever, chills, weight loss Neck: Negative for lumps, goiter, pain and significant neck swelling Resp: Negative for cough, wheezing, dyspnea at rest CV: Negative for chest pain, leg swelling, palpitations, orthopnea GI: denies melena, hematochezia, nausea, vomiting, diarrhea, constipation, dysphagia, odyonophagia, early satiety or unintentional weight loss. +lower abdominal pain  MSK: Negative for joint pain or swelling, back pain, and muscle pain. Derm: Negative for itching or rash Psych: Denies depression, anxiety, memory loss, confusion. No homicidal or suicidal ideation.  Heme: Negative for prolonged bleeding, bruising easily, and swollen nodes. Endocrine: Negative for cold or heat intolerance, polyuria, polydipsia and goiter. Neuro: negative for tremor, gait imbalance, syncope and seizures. The remainder of the review of systems is noncontributory.  Physical Exam: BP (!) 144/72 (BP Location: Left Arm, Patient Position: Sitting, Cuff Size: Normal)   Pulse 60   Temp (!) 97.1 F (36.2 C) (Temporal)   Ht 6' 2 (1.88 m)   Wt 241 lb 11.2 oz (109.6 kg)   BMI 31.03 kg/m  General:   Alert and oriented. No distress noted. Pleasant and cooperative.  Head:  Normocephalic and atraumatic. Eyes:  Conjuctiva clear without scleral icterus. Mouth:  Oral mucosa pink and moist. Good dentition. No lesions. Heart: Normal rate and rhythm, s1 and s2 heart sounds present.  Lungs: Clear lung sounds in all lobes. Respirations equal and unlabored. Abdomen:  +BS, soft, non-tender and  non-distended. No rebound or guarding. No HSM or masses noted. Derm: No palmar erythema or jaundice Msk:   Symmetrical without gross deformities. Normal posture. Extremities:  Without edema. Neurologic:  Alert and  oriented x4 Psych:  Alert and cooperative. Normal mood and affect.  Invalid input(s): 6 MONTHS   ASSESSMENT: [name] is a 83 y.o. male presenting today for follow up GERD, IBS with constipation   GERD/epigastric pain: resolved with protonix  40mg  BID. As he has no breakthrough symptoms, will try stepping down to once daily dosing of PPI, if he has recurrence of epigastric pain or breakthrough GERD symptoms, will need to resume BID dosing.  IBS with constipation: Recent colonoscopy as above with loopy colon, likely contributing to his constipation. He had recent CT imaging in September for lower abdominal pain which is chronic, no findings to explain symptoms. Feels pain sometimes improved with defecation and seems lessened as his constipation has gotten better. Low suspicion for Mesenteric ischemia as he has no association of pain to eating, no rectal bleeding or weight loss. He is using linzess  how only on regular basis as he notes constipation seemed to improved spontaneously. We discussed bentyl in the past but he was unsure if he wanted to try it, I again discussed use of anti spasmodics with him today, we will try bentyl 10mg  starting with PRN use up to TID. If he finds this helps his pain, can try taking on more scheduled basis. Did advise him to take first few doses when he is going to be at home to see if he has any drowsiness from the medication.     PLAN:  -decrease protonix  40mg  to once daily, resume BID dosing if breakthrough GERD or epigastric pain  -start bentyl 10mg  TID PRN -Increase water  intake, aim for atleast 64 oz per day -Increase fruits, veggies and whole grains, kiwi and prunes are especially good for constipation -continue linzess  145mcg PRN  All questions were answered, patient verbalized understanding and is in agreement with plan as outlined above.    Follow Up: 4 months   Hanny Elsberry L. Jerrard Bradburn, MSN, APRN, AGNP-C Adult-Gerontology Nurse Practitioner [facility]  "

## 2024-03-01 ENCOUNTER — Other Ambulatory Visit (INDEPENDENT_AMBULATORY_CARE_PROVIDER_SITE_OTHER): Payer: Self-pay

## 2024-03-01 DIAGNOSIS — K269 Duodenal ulcer, unspecified as acute or chronic, without hemorrhage or perforation: Secondary | ICD-10-CM

## 2024-03-01 DIAGNOSIS — K219 Gastro-esophageal reflux disease without esophagitis: Secondary | ICD-10-CM

## 2024-03-01 MED ORDER — PANTOPRAZOLE SODIUM 40 MG PO TBEC: 40.0000 mg | DELAYED_RELEASE_TABLET | Freq: Two times a day (BID) | ORAL | 1 refills | Status: AC

## 2024-03-05 ENCOUNTER — Telehealth (INDEPENDENT_AMBULATORY_CARE_PROVIDER_SITE_OTHER): Payer: Self-pay

## 2024-03-05 ENCOUNTER — Other Ambulatory Visit (INDEPENDENT_AMBULATORY_CARE_PROVIDER_SITE_OTHER): Payer: Self-pay | Admitting: Gastroenterology

## 2024-03-05 MED ORDER — DICYCLOMINE HCL 10 MG PO CAPS: 10.0000 mg | ORAL_CAPSULE | Freq: Three times a day (TID) | ORAL | 1 refills | Status: AC | PRN

## 2024-03-05 NOTE — Telephone Encounter (Signed)
 I spoke with the patient and made her aware per Mitzie Boettcher Np, I apologize this got missed being sent in. I have sent the bentyl  to try up to TID PRN for his abdominal pain. He can let me know how this works for him over the next few weeks. Patient made aware of all and states understanding.

## 2024-03-05 NOTE — Telephone Encounter (Signed)
 Patient walked into the clinic today stating his symptoms are no better and we were supposed to send in a medication for him at last visit, and this was not done.   Patient was last seen 02/13/2024 by Mitzie due to IBS with constipation.   Patient says he is still having issues with lower abdominal pain, and the feeling of having to have a bm constantly.   He reports that when he has this feeling he is able to defecate, but only small amounts. He reports he has multiple small bm through out the day, with some days having up to ten bm's per day.He reports the stools are solid and no issues with loose stools. He says he has a feeling he is unable to complete his stooling. He has growling in his stomach. He denies any fevers, dark or bloody stools, or mucus in stools.  Looks like at the last office visit we were going to send in Bentyl  to take TID prn, which was not done. Patient uses [facility], Please advise.   Thanks,

## 2024-03-15 ENCOUNTER — Ambulatory Visit: Admitting: Orthopedic Surgery

## 2024-03-15 DIAGNOSIS — M75122 Complete rotator cuff tear or rupture of left shoulder, not specified as traumatic: Secondary | ICD-10-CM

## 2024-03-15 DIAGNOSIS — M47812 Spondylosis without myelopathy or radiculopathy, cervical region: Secondary | ICD-10-CM

## 2024-03-15 MED ORDER — TRAMADOL HCL 50 MG PO TABS: 50.0000 mg | ORAL_TABLET | Freq: Four times a day (QID) | ORAL | 0 refills | Status: AC | PRN

## 2024-03-15 NOTE — Progress Notes (Signed)
 Orthopaedic Clinic Return  Assessment & Plan: [name] is a 84 y.o. male with the following: Painful left rotator cuff tear; not interested in surgery  Assessment & Plan Left shoulder rotator cuff tear Chronic full-thickness tear with persistent pain, unresponsive to prior corticosteroid injection. Surgical intervention not recommended due to preference and poor outcomes with past surgeries. Focus on symptom control and sleep improvement. NSAIDs contraindicated due to anticoagulation. Hyaluronic acid injections unlikely approved and lack efficacy evidence. Surgical risks outweigh benefits given age and comorbidities. - Administered repeat corticosteroid injection to the left shoulder. - Prescribed tramadol  for nocturnal pain, as needed; prescription sent to preferred pharmacy. - Discussed hyaluronic acid injections unlikely approved and lack efficacy evidence. - Discussed surgical options, including shoulder arthroplasty; her is not interested due to previous issues with anesthesia following back surgery. - Discussed NSAIDs contraindicated due to Eliquis  use. - Offered referral to pain management if tramadol  insufficient. - Provided guidance on chronic condition, focusing on nocturnal pain and sleep quality improvement.  Cervical spondylosis Chronic cervical spondylosis with neck pain and limited range of motion. Imaging shows arthritis and osteophytes. Symptoms may affect upper extremity and nocturnal discomfort, but I think the shoulder is primary pain source at this time. Declined cervical injections, prefers non-surgical management.  - Discussed cervical injections as future option for symptom relief. - Discussed chiropractic care may help, other modalities to be considered with chiropractor. - Offered referral to pain management for evaluation and possible cervical injections if symptoms worsen. - Provided guidance on chronic nature of cervical spondylosis and neck-shoulder symptom  interplay.    Procedure note injection Left shoulder    Verbal consent was obtained to inject the left shoulder, subacromial space Timeout was completed to confirm the site of injection.  The skin was prepped with alcohol and ethyl chloride was sprayed at the injection site.  A 21-gauge needle was used to inject 40 mg of Depo-Medrol  and 1% lidocaine  (4 cc) into the subacromial space of the left shoulder using a posterolateral approach.  There were no complications. A sterile bandage was applied.    Follow-up: Return if symptoms worsen or fail to improve.   Subjective:  Chief Complaint  Patient presents with   Shoulder Pain    L doesn't feel the shot helped any but doesn't want to have surgery. Would like to know what other options he has.      Discussed the use of AI scribe software for clinical note transcription with the patient, who gave verbal consent to proceed.  History of Present Illness [name] is an 84 year old male with left rotator cuff tear and cervical spondylosis who presents for evaluation of chronic left shoulder pain with nocturnal symptoms.  For over six months he has had chronic left shoulder pain with minimal relief from a prior corticosteroid injection. Daytime pain is tolerable, but nocturnal pain is severe and frequently prevents sleep. Pain is worsened by reaching or lifting. He has nocturnal numbness and paresthesia in the left hand and arm, with intermittent swelling, increased hand size, and warmth, especially with decreased activity.  He is not using acetaminophen  or other analgesics because of concern about side effects and interactions. He has not had hyaluronic acid injections and is unsure about insurance coverage. Surgical options including rotator cuff repair and shoulder arthroplasty were discussed previously, but he wishes to avoid further surgery and strongly prefers non-surgical management.  He also has significant neck pain and stiffness  with limited range of motion,  especially with turning. Prior neurosurgical evaluation identified cervical osteophytes and recommended injections, which he declined. He is interested in non-surgical options for neck pain and feels his cervical symptoms may be contributing to his upper extremity complaints.    Review of Systems: No fevers or chills No numbness or tingling No chest pain No shortness of breath No bowel or bladder dysfunction No GI distress No headaches   Objective: There were no vitals taken for this visit.  Physical Exam:  Physical Exam Restricted neck range of motion.  Sensation intact in left hand.  FF limited to 100 degrees. Positive drop arm.  Positive Jobe's.  Positive O'Brien's.  Tenderness over the anterior shoulder.     IMAGING: I personally ordered and reviewed the following images:  No new imaging obtained today   Portions of this note were completed with dictation software and mistakes or typos may exist.   Walter DELENA Horde, MD 03/16/2024 10:06 AM

## 2024-03-16 ENCOUNTER — Encounter: Payer: Self-pay | Admitting: Orthopedic Surgery

## 2024-03-16 NOTE — Patient Instructions (Signed)

## 2024-03-25 ENCOUNTER — Ambulatory Visit

## 2024-03-25 DIAGNOSIS — I495 Sick sinus syndrome: Secondary | ICD-10-CM

## 2024-03-26 LAB — CUP PACEART REMOTE DEVICE CHECK
Battery Remaining Longevity: 100 mo
Battery Remaining Percentage: 80 %
Battery Voltage: 3.01 V
Brady Statistic AP VP Percent: 1.5 %
Brady Statistic AP VS Percent: 78 %
Brady Statistic AS VP Percent: 1 %
Brady Statistic AS VS Percent: 19 %
Brady Statistic RA Percent Paced: 77 %
Brady Statistic RV Percent Paced: 1.6 %
Date Time Interrogation Session: 20260202020020
Implantable Lead Connection Status: 753985
Implantable Lead Connection Status: 753985
Implantable Lead Implant Date: 20231101
Implantable Lead Implant Date: 20231101
Implantable Lead Location: 753859
Implantable Lead Location: 753860
Implantable Pulse Generator Implant Date: 20231101
Lead Channel Impedance Value: 400 Ohm
Lead Channel Impedance Value: 480 Ohm
Lead Channel Pacing Threshold Amplitude: 0.5 V
Lead Channel Pacing Threshold Amplitude: 0.75 V
Lead Channel Pacing Threshold Pulse Width: 0.5 ms
Lead Channel Pacing Threshold Pulse Width: 0.5 ms
Lead Channel Sensing Intrinsic Amplitude: 12 mV
Lead Channel Sensing Intrinsic Amplitude: 5 mV
Lead Channel Setting Pacing Amplitude: 1 V
Lead Channel Setting Pacing Amplitude: 1.5 V
Lead Channel Setting Pacing Pulse Width: 0.5 ms
Lead Channel Setting Sensing Sensitivity: 2 mV
Pulse Gen Model: 2272
Pulse Gen Serial Number: 8115159

## 2024-03-29 NOTE — Progress Notes (Signed)
 Remote PPM Transmission

## 2024-06-24 ENCOUNTER — Ambulatory Visit

## 2024-09-23 ENCOUNTER — Ambulatory Visit
